# Patient Record
Sex: Female | Born: 1935 | Race: White | Hispanic: No | Smoking: Never smoker
Health system: Southern US, Community
[De-identification: ages and names within clinical notes are randomized; demographics above are authoritative.]

## PROBLEM LIST (undated history)

## (undated) DIAGNOSIS — E039 Hypothyroidism, unspecified: Secondary | ICD-10-CM

## (undated) DIAGNOSIS — R42 Dizziness and giddiness: Secondary | ICD-10-CM

## (undated) DIAGNOSIS — F329 Major depressive disorder, single episode, unspecified: Secondary | ICD-10-CM

## (undated) DIAGNOSIS — R0602 Shortness of breath: Secondary | ICD-10-CM

## (undated) DIAGNOSIS — K559 Vascular disorder of intestine, unspecified: Secondary | ICD-10-CM

## (undated) DIAGNOSIS — F32A Depression, unspecified: Secondary | ICD-10-CM

## (undated) DIAGNOSIS — I639 Cerebral infarction, unspecified: Secondary | ICD-10-CM

## (undated) DIAGNOSIS — R918 Other nonspecific abnormal finding of lung field: Secondary | ICD-10-CM

## (undated) DIAGNOSIS — I2510 Atherosclerotic heart disease of native coronary artery without angina pectoris: Secondary | ICD-10-CM

## (undated) DIAGNOSIS — M1990 Unspecified osteoarthritis, unspecified site: Secondary | ICD-10-CM

## (undated) DIAGNOSIS — N390 Urinary tract infection, site not specified: Secondary | ICD-10-CM

## (undated) DIAGNOSIS — A0811 Acute gastroenteropathy due to Norwalk agent: Secondary | ICD-10-CM

## (undated) DIAGNOSIS — I5032 Chronic diastolic (congestive) heart failure: Secondary | ICD-10-CM

## (undated) DIAGNOSIS — R0989 Other specified symptoms and signs involving the circulatory and respiratory systems: Secondary | ICD-10-CM

## (undated) DIAGNOSIS — G4733 Obstructive sleep apnea (adult) (pediatric): Secondary | ICD-10-CM

## (undated) DIAGNOSIS — N200 Calculus of kidney: Secondary | ICD-10-CM

## (undated) DIAGNOSIS — K219 Gastro-esophageal reflux disease without esophagitis: Secondary | ICD-10-CM

## (undated) DIAGNOSIS — E7800 Pure hypercholesterolemia, unspecified: Secondary | ICD-10-CM

## (undated) DIAGNOSIS — Z5189 Encounter for other specified aftercare: Secondary | ICD-10-CM

## (undated) DIAGNOSIS — T148XXA Other injury of unspecified body region, initial encounter: Secondary | ICD-10-CM

## (undated) HISTORY — PX: HERNIA REPAIR: SHX51

## (undated) HISTORY — PX: TONSILLECTOMY: SUR1361

## (undated) HISTORY — DX: Vascular disorder of intestine, unspecified: K55.9

## (undated) HISTORY — PX: APPENDECTOMY: SHX54

## (undated) HISTORY — PX: OTHER SURGICAL HISTORY: SHX169

## (undated) HISTORY — DX: Obstructive sleep apnea (adult) (pediatric): G47.33

## (undated) HISTORY — PX: ABDOMINAL HYSTERECTOMY: SHX81

## (undated) HISTORY — PX: HIP SURGERY: SHX245

## (undated) HISTORY — PX: CATARACT EXTRACTION: SUR2

---

## 1998-04-23 ENCOUNTER — Inpatient Hospital Stay (HOSPITAL_COMMUNITY): Admission: RE | Admit: 1998-04-23 | Discharge: 1998-04-26 | Payer: Self-pay | Admitting: Urology

## 2004-01-03 ENCOUNTER — Encounter: Payer: Self-pay | Admitting: Pulmonary Disease

## 2004-07-02 ENCOUNTER — Ambulatory Visit: Payer: Self-pay | Admitting: Internal Medicine

## 2004-07-17 ENCOUNTER — Ambulatory Visit: Payer: Self-pay | Admitting: Internal Medicine

## 2004-07-31 ENCOUNTER — Ambulatory Visit: Payer: Self-pay | Admitting: Internal Medicine

## 2004-08-03 ENCOUNTER — Emergency Department (HOSPITAL_COMMUNITY): Admission: EM | Admit: 2004-08-03 | Discharge: 2004-08-04 | Payer: Self-pay | Admitting: Emergency Medicine

## 2004-08-05 ENCOUNTER — Ambulatory Visit (HOSPITAL_COMMUNITY): Admission: RE | Admit: 2004-08-05 | Discharge: 2004-08-05 | Payer: Self-pay | Admitting: Pulmonary Disease

## 2004-08-05 ENCOUNTER — Ambulatory Visit: Payer: Self-pay | Admitting: Pulmonary Disease

## 2004-08-06 ENCOUNTER — Ambulatory Visit (HOSPITAL_COMMUNITY): Admission: RE | Admit: 2004-08-06 | Discharge: 2004-08-06 | Payer: Self-pay | Admitting: Pulmonary Disease

## 2004-08-06 ENCOUNTER — Encounter: Payer: Self-pay | Admitting: Cardiology

## 2004-08-07 ENCOUNTER — Ambulatory Visit: Payer: Self-pay | Admitting: Cardiology

## 2004-08-19 ENCOUNTER — Ambulatory Visit (HOSPITAL_COMMUNITY): Admission: RE | Admit: 2004-08-19 | Discharge: 2004-08-19 | Payer: Self-pay | Admitting: Pulmonary Disease

## 2004-08-27 ENCOUNTER — Ambulatory Visit: Payer: Self-pay | Admitting: Pulmonary Disease

## 2004-09-12 ENCOUNTER — Ambulatory Visit: Payer: Self-pay | Admitting: Pulmonary Disease

## 2004-09-18 ENCOUNTER — Encounter: Admission: RE | Admit: 2004-09-18 | Discharge: 2004-09-18 | Payer: Self-pay | Admitting: Neurology

## 2004-12-05 ENCOUNTER — Encounter: Payer: Self-pay | Admitting: Pulmonary Disease

## 2004-12-11 ENCOUNTER — Ambulatory Visit: Payer: Self-pay | Admitting: Pulmonary Disease

## 2004-12-23 ENCOUNTER — Ambulatory Visit: Payer: Self-pay | Admitting: Internal Medicine

## 2005-01-05 ENCOUNTER — Ambulatory Visit (HOSPITAL_BASED_OUTPATIENT_CLINIC_OR_DEPARTMENT_OTHER): Admission: RE | Admit: 2005-01-05 | Discharge: 2005-01-05 | Payer: Self-pay | Admitting: Pulmonary Disease

## 2005-01-07 ENCOUNTER — Ambulatory Visit: Payer: Self-pay | Admitting: Pulmonary Disease

## 2005-01-21 ENCOUNTER — Ambulatory Visit: Payer: Self-pay | Admitting: Pulmonary Disease

## 2005-05-25 DIAGNOSIS — Z5189 Encounter for other specified aftercare: Secondary | ICD-10-CM

## 2005-05-25 DIAGNOSIS — IMO0001 Reserved for inherently not codable concepts without codable children: Secondary | ICD-10-CM

## 2005-05-25 HISTORY — DX: Reserved for inherently not codable concepts without codable children: IMO0001

## 2005-05-25 HISTORY — PX: JOINT REPLACEMENT: SHX530

## 2005-05-25 HISTORY — DX: Encounter for other specified aftercare: Z51.89

## 2005-07-02 ENCOUNTER — Ambulatory Visit: Payer: Self-pay | Admitting: *Deleted

## 2005-07-09 ENCOUNTER — Ambulatory Visit: Payer: Self-pay | Admitting: Pulmonary Disease

## 2005-12-30 ENCOUNTER — Ambulatory Visit: Payer: Self-pay | Admitting: Pulmonary Disease

## 2005-12-30 ENCOUNTER — Ambulatory Visit: Payer: Self-pay | Admitting: Cardiology

## 2006-07-22 ENCOUNTER — Ambulatory Visit: Payer: Self-pay | Admitting: Pulmonary Disease

## 2007-05-26 HISTORY — PX: CORONARY ARTERY BYPASS GRAFT: SHX141

## 2007-06-23 ENCOUNTER — Ambulatory Visit: Payer: Self-pay | Admitting: Cardiothoracic Surgery

## 2007-06-30 ENCOUNTER — Ambulatory Visit (HOSPITAL_COMMUNITY): Admission: RE | Admit: 2007-06-30 | Discharge: 2007-06-30 | Payer: Self-pay | Admitting: Cardiothoracic Surgery

## 2007-06-30 ENCOUNTER — Encounter: Payer: Self-pay | Admitting: Cardiothoracic Surgery

## 2007-07-04 ENCOUNTER — Inpatient Hospital Stay (HOSPITAL_COMMUNITY): Admission: RE | Admit: 2007-07-04 | Discharge: 2007-07-09 | Payer: Self-pay | Admitting: Cardiothoracic Surgery

## 2007-07-04 ENCOUNTER — Ambulatory Visit: Payer: Self-pay | Admitting: Cardiothoracic Surgery

## 2007-07-21 ENCOUNTER — Encounter: Payer: Self-pay | Admitting: Pulmonary Disease

## 2007-07-29 ENCOUNTER — Ambulatory Visit: Payer: Self-pay | Admitting: Infectious Diseases

## 2007-07-29 ENCOUNTER — Inpatient Hospital Stay (HOSPITAL_COMMUNITY): Admission: EM | Admit: 2007-07-29 | Discharge: 2007-08-05 | Payer: Self-pay | Admitting: Emergency Medicine

## 2007-09-15 ENCOUNTER — Ambulatory Visit: Payer: Self-pay | Admitting: Cardiothoracic Surgery

## 2008-05-25 DIAGNOSIS — G4733 Obstructive sleep apnea (adult) (pediatric): Secondary | ICD-10-CM

## 2008-05-25 DIAGNOSIS — K559 Vascular disorder of intestine, unspecified: Secondary | ICD-10-CM

## 2008-05-25 HISTORY — DX: Obstructive sleep apnea (adult) (pediatric): G47.33

## 2008-05-25 HISTORY — DX: Vascular disorder of intestine, unspecified: K55.9

## 2008-11-22 DIAGNOSIS — F411 Generalized anxiety disorder: Secondary | ICD-10-CM | POA: Insufficient documentation

## 2008-11-22 DIAGNOSIS — I6350 Cerebral infarction due to unspecified occlusion or stenosis of unspecified cerebral artery: Secondary | ICD-10-CM

## 2008-11-22 DIAGNOSIS — E039 Hypothyroidism, unspecified: Secondary | ICD-10-CM

## 2008-11-23 ENCOUNTER — Ambulatory Visit: Payer: Self-pay | Admitting: Pulmonary Disease

## 2008-11-23 ENCOUNTER — Ambulatory Visit: Payer: Self-pay | Admitting: Gynecology

## 2008-11-23 DIAGNOSIS — J984 Other disorders of lung: Secondary | ICD-10-CM

## 2008-11-23 DIAGNOSIS — G4733 Obstructive sleep apnea (adult) (pediatric): Secondary | ICD-10-CM

## 2008-11-23 DIAGNOSIS — R0902 Hypoxemia: Secondary | ICD-10-CM | POA: Insufficient documentation

## 2008-12-10 ENCOUNTER — Inpatient Hospital Stay (HOSPITAL_COMMUNITY): Admission: EM | Admit: 2008-12-10 | Discharge: 2008-12-13 | Payer: Self-pay | Admitting: Emergency Medicine

## 2008-12-10 ENCOUNTER — Ambulatory Visit: Payer: Self-pay | Admitting: Gastroenterology

## 2008-12-18 ENCOUNTER — Ambulatory Visit: Payer: Self-pay | Admitting: Gynecology

## 2008-12-27 ENCOUNTER — Encounter: Admission: RE | Admit: 2008-12-27 | Discharge: 2008-12-27 | Payer: Self-pay | Admitting: Cardiovascular Disease

## 2009-01-25 ENCOUNTER — Telehealth (INDEPENDENT_AMBULATORY_CARE_PROVIDER_SITE_OTHER): Payer: Self-pay | Admitting: *Deleted

## 2010-05-07 ENCOUNTER — Encounter
Admission: RE | Admit: 2010-05-07 | Discharge: 2010-05-07 | Payer: Self-pay | Source: Home / Self Care | Attending: Internal Medicine | Admitting: Internal Medicine

## 2010-06-15 ENCOUNTER — Encounter: Payer: Self-pay | Admitting: Cardiothoracic Surgery

## 2010-06-15 ENCOUNTER — Encounter: Payer: Self-pay | Admitting: Pulmonary Disease

## 2010-08-31 LAB — DIFFERENTIAL
Eosinophils Absolute: 0.2 10*3/uL (ref 0.0–0.7)
Lymphs Abs: 2.1 10*3/uL (ref 0.7–4.0)
Neutro Abs: 9.7 10*3/uL — ABNORMAL HIGH (ref 1.7–7.7)
Neutrophils Relative %: 75 % (ref 43–77)

## 2010-08-31 LAB — URINE CULTURE

## 2010-08-31 LAB — BASIC METABOLIC PANEL
BUN: 4 mg/dL — ABNORMAL LOW (ref 6–23)
BUN: 6 mg/dL (ref 6–23)
CO2: 24 mEq/L (ref 19–32)
CO2: 29 mEq/L (ref 19–32)
CO2: 30 mEq/L (ref 19–32)
Calcium: 7.6 mg/dL — ABNORMAL LOW (ref 8.4–10.5)
Calcium: 8.4 mg/dL (ref 8.4–10.5)
Chloride: 104 mEq/L (ref 96–112)
Chloride: 111 mEq/L (ref 96–112)
Creatinine, Ser: 0.5 mg/dL (ref 0.4–1.2)
Creatinine, Ser: 0.51 mg/dL (ref 0.4–1.2)
Creatinine, Ser: 0.54 mg/dL (ref 0.4–1.2)
GFR calc Af Amer: >60 mL/min (ref >60)
GFR calc Af Amer: >60 mL/min (ref >60)
Glucose, Bld: 104 mg/dL — ABNORMAL HIGH (ref 70–99)
Potassium: 3.9 mEq/L (ref 3.5–5.1)
Sodium: 143 mEq/L (ref 135–145)

## 2010-08-31 LAB — CBC
HCT: 27.8 % — ABNORMAL LOW (ref 36.0–46.0)
HCT: 30.1 % — ABNORMAL LOW (ref 36.0–46.0)
Hemoglobin: 10.3 g/dL — ABNORMAL LOW (ref 12.0–15.0)
Hemoglobin: 11.4 g/dL — ABNORMAL LOW (ref 12.0–15.0)
MCHC: 33.7 g/dL (ref 30.0–36.0)
MCHC: 34.3 g/dL (ref 30.0–36.0)
MCHC: 34.4 g/dL (ref 30.0–36.0)
MCHC: 34.5 g/dL (ref 30.0–36.0)
MCV: 94.8 fL (ref 78.0–100.0)
MCV: 94.9 fL (ref 78.0–100.0)
MCV: 95.5 fL (ref 78.0–100.0)
RBC: 2.91 MIL/uL — ABNORMAL LOW (ref 3.87–5.11)
RBC: 3.17 MIL/uL — ABNORMAL LOW (ref 3.87–5.11)
RBC: 3.47 MIL/uL — ABNORMAL LOW (ref 3.87–5.11)
RDW: 12.9 % (ref 11.5–15.5)
RDW: 13.5 % (ref 11.5–15.5)
WBC: 6.5 10*3/uL (ref 4.0–10.5)

## 2010-08-31 LAB — URINE MICROSCOPIC-ADD ON

## 2010-08-31 LAB — COMPREHENSIVE METABOLIC PANEL
ALT: 14 U/L (ref 0–35)
BUN: 11 mg/dL (ref 6–23)
Calcium: 8.3 mg/dL — ABNORMAL LOW (ref 8.4–10.5)
GFR calc non Af Amer: >60 mL/min (ref >60)
Total Protein: 6.1 g/dL (ref 6.0–8.3)

## 2010-08-31 LAB — CK TOTAL AND CKMB (NOT AT ARMC)
CK, MB: 2.4 ng/mL (ref 0.3–4.0)
Total CK: 59 U/L (ref 7–177)

## 2010-08-31 LAB — URINALYSIS, ROUTINE W REFLEX MICROSCOPIC
Glucose, UA: NEGATIVE mg/dL
Hgb urine dipstick: NEGATIVE
Specific Gravity, Urine: 1.022 (ref 1.005–1.030)

## 2010-08-31 LAB — CARDIAC PANEL(CRET KIN+CKTOT+MB+TROPI)
Relative Index: INVALID (ref 0.0–2.5)
Troponin I: 0.02 ng/mL (ref 0.00–0.06)

## 2010-08-31 LAB — TSH: TSH: 1.982 u[IU]/mL (ref 0.350–4.500)

## 2010-08-31 LAB — LIPASE, BLOOD: Lipase: 15 U/L (ref 11–59)

## 2010-08-31 LAB — SAMPLE TO BLOOD BANK

## 2010-10-07 NOTE — H&P (Signed)
NAME:  Gina Bowers, Gina Bowers               ACCOUNT NO.:  [id number]   MEDICAL RECORD NO.:  [id number]          PATIENT TYPE:  INP   LOCATION:  5528                         FACILITY:  [facility]   PHYSICIAN:  Beckey Rutter, MD  DATE OF BIRTH:  Oct 02, 1925   DATE OF ADMISSION:  07/28/2007  DATE OF DISCHARGE:                              HISTORY & PHYSICAL   PRIMARY CARDIOLOGIST:  Dr. Alanda Amass   CHIEF COMPLAINT:  Confusion.   HISTORY OF PRESENT ILLNESS:  This is an 78 year old pleasant Caucasian  female with past medical history significant for coronary artery  disease.  She is status post CABG on July 04, 2007.  She also has  past medical history significant for depression, gastroesophageal reflux  disease, high cholesterol, hypothyroidism, history of CVA, and urinary  tract infection and chronic suppression with Macrobid.  The patient  brought in today by ambulance because of altered mentation and inability  to answer the questions asked by the daughter correctly.  The patient  has some visual hallucination, seeing furniture where they are not  there.  The patient felt detached and could not recognize the place they  are living in as per the daughter.  There is no specific focal weakness  noted.   PAST MEDICAL HISTORY:  Significant for:  1. Coronary artery disease status post CABG 3 weeks ago on July 04, 2007, done by Dr. Tyrone Sage.  2. Depression.  3. GERD.  4. High cholesterol.  5. Hypothyroidism.  6. History of CVA.  7. Urinary tract infection.   SOCIAL HISTORY:  No drug abuse, ethanol abuse or tobacco abuse.   FAMILY HISTORY:  Noncontributory.   MEDICATION ALLERGY:  SULFA/SULFONAMIDES.   MEDICATION:  1. Detrol 4 mg daily.  2. Vytorin 10/40 one tablet daily.  3. Plavix 75 mg p.o. daily.  4. Nexium 40 mg daily.  5. Zoloft 75 mg p.o.  6. Reglan 10 mg p.o. daily.  7. Synthroid 75 mcg daily.  8. Macrodantin 100 mg daily.  9. Aspirin 325 mg daily.  10.Folic acid 1  mg daily.  11.Tramadol/acetaminophen 50.  12.Lasix.  13.Potassium chloride.   REVIEW OF SYSTEMS:  A 12-point review of systems is noncontributory.   EXAMINATION:  Temperature 101.9, blood pressure 93/53, pulse 84,  respiratory rate is 20.  HEAD:  Atraumatic, normocephalic.  Eyes:  PERRL.  Mouth moist, no ulcer.  NECK:  Supple.  No JVD.  PRECORDIUM:  First and second heart sounds audible.  No added sounds.  ABDOMEN:  Soft, nontender.  Bowel sounds present.  EXTREMITIES:  No lower extremities edema.  NEUROLOGICALLY:  The patient is alert, oriented x3.   LABORATORIES:  Showing urinalysis:  Color is a amber, cloudy; pH is 5.5;  ketones negative; nitrate is negative; leukocyte esterase negative.  Creatinine is 0.6, sodium is 136, potassium 4.1, chloride 102, glucose  171, BUN is 13.  Hemoglobin A1c is 5.6.  White blood count is 13.1,  hemoglobin is 11.6, hematocrit is 35.3 and platelet count is 324.  Chest  x-ray done on July 28, 2007, impression is small pleural  effusions and  bibasilar atelectasis.  Improved basilar aeration versus previous study.  CT head without contrast done on July 28, 2007, impression:  Atrophy  with small-vessel chronic ischemic change of deep cerebral white matter,  no acute intracranial abnormalities.   ASSESSMENT AND PLAN:  This lady is a Caucasian female, presented with  neurological symptoms which are resolved now.  It could be very well a  transient ischemic attack picture and the plan is to admit the patient  for further workup.  The patient has a slight white count though there  is no source of infection which could be the culprit behind the change  in mental station, i.e. the infectious status.  But transient ischemic  attack cannot be ruled entirely out as of this moment.   1. The patient will be admitted to telemetry floor for further      assessment and management.  2. The patient has a primary neurologist, Dr. Pearlean Brownie.  We will consult      for  further workup and medical opinion.  3. Will continue the patient on lipid-lowering medication as well as      the Plavix.  4. The patient also will be continued on her Synthroid.  Will hold the      Lasix and potassium chloride for some time before reinstating.  5. For DVT prophylaxis will consider Lovenox.  6. For GI prophylaxis we will start the patient on Protonix.      Beckey Rutter, MD  Electronically Signed     EME/MEDQ  D:  07/29/2007  T:  07/30/2007  Job:  [id number]

## 2010-10-07 NOTE — Assessment & Plan Note (Signed)
OFFICE VISIT   Gina Bowers, Gina Bowers  DOB:  12-Jun-1925                                        September 15, 2007  CHART #:  [id number]   HISTORY OF PRESENT ILLNESS:  The patient is an 77 year old female who is  status post coronary artery bypass grafting x4 on 07/04/2007 for severe  coronary artery disease.  Since her discharge, she did require  rehospitalization by the medical physicians for a urinary tract  infection with mental status changes.  Dr. Tyrone Sage did see the patient  during this hospitalization.  Since her discharge, she has also followed  up with her cardiologist, Dr. Alanda Amass, on 2 occasions.  On today's  date, she reports her overall progress is slow but good.  She is  scheduled to start cardiac rehabilitation next week.  She is having no  significant issues related to her incisions or other surgical issues.  She denies significant shortness of breath.  She does note that her  sternum continues to be somewhat sore but improving with time.   PHYSICAL EXAMINATION:  Vital signs:  Blood pressure 118/77, pulse 90,  respirations 22, oxygen saturation 92% on room air.  General:  An  elderly female in no acute distress.  Pulmonary examination reveals  slightly diminished breath sounds in the bases, otherwise clear.  Cardiac examination:  Regular rate and rhythm.  No murmurs, gallops, or  rubs.  Extremity examination reveals minor edema.  Her incisions are all  well healed without evidence of infection.   ASSESSMENT:  The patient is making adequate progress.  She is encouraged  to continue her cardiac rehabilitation, and continue her followup with  cardiology for risk modification management.  She has no significant  current surgical issues.  She will be seen from our viewpoint, now, on a  p.r.n. basis.   Rowe Clack, P.A.-C.   Sherryll Burger  D:  09/15/2007  T:  09/15/2007  Job:  [id number]   cc:   Gerlene Burdock A. Alanda Amass, M.D.

## 2010-10-07 NOTE — Discharge Summary (Signed)
NAME:  Gina Bowers, Gina Bowers               ACCOUNT NO.:  [id number]   MEDICAL RECORD NO.:  [id number]          PATIENT TYPE:  INP   LOCATION:  5528                         FACILITY:  [facility]   PHYSICIAN:  Ladell Pier, M.D.   DATE OF BIRTH:  14-Nov-1925   DATE OF ADMISSION:  07/28/2007  DATE OF DISCHARGE:  08/02/2007                               DISCHARGE SUMMARY   DISCHARGE DIAGNOSES:  1. Fever.  2. Altered mental status.  3. Anemia.  4. Leukocytosis.  5. Hypothyroidism.  6. Obstructive sleep apnea, followed by pulmonary.  7. Coronary artery disease, status post coronary artery bypass graft      surgery on June 27, 2007.  8. Depression.  9. History of transient ischemic attack, followed by Dr. Sunny Schlein.      Sethi.  10.History of cerebrovascular accident.  11.History of dyslipidemia.   DISCHARGE MEDICATIONS:  1. Detrol 4 mg daily.  2. Vytorin 10/40 mg daily.  3. Plavix 75 mg daily.  4. Nexium 40 mg daily.  5. Zoloft 75 mg daily.  6. Reglan 10 mg daily.  7. Synthroid 75 mcg daily.  8. Macrodantin 100 mg daily.  9. Aspirin 325 mg daily.  10.Folic acid 1 mg daily.  11.Tramadol p.r.n.   FOLLOWUP:  The patient is to follow up with her primary care physician  in one to two weeks.   PROCEDURE:  None.   CONSULTATIONS:  1. Infectious Disease, Dr. Tinnie Gens C. Hatcher.  2. Dr. Sheliah Plane also came to see the patient.  3. A curbside consultation with Dr. Melvyn Novas, neurology.   HISTORY OF PRESENT ILLNESS:  The patient is an 77 year old Caucasian  female with a past medical history significant for heart disease.  Recently had open heart surgery.  She has also had a past medical  history significant for depression, gastroesophageal reflux disease,  high cholesterol, hypothyroidism, stroke and frequent urinary tract  infections on chronic suppression therapy.  The patient was brought in  by ambulance, secondary to an altered mental status and the inability to  answer  questions asked by the daughter correctly.  The patient has some  visual hallucinations.  She was seeing furniture where there was none.  She felt detached and could not recognize the place they are living in,  as per her daughter.  No focal  neurological deficits.   PAST MEDICAL/SOCIAL/FAMILY HISTORY/MEDICATIONS/ALLERGIES/REVIEW OF  SYSTEMS:  As per the admission H&P.   PHYSICAL EXAMINATION:  VITAL SIGNS:  Temperature 97.4 degrees, pulse 67,  respirations 18, blood pressure 130/72, pulse oximetry 96% on 2 liters.  HEENT:  Normocephalic and atraumatic.  Pupils reactive to light.  Throat  without erythema.  CARDIOVASCULAR:  A regular rate and rhythm with a midline scar from her  prior surgery.  LUNGS:  Clear bilaterally.  No wheezes, rhonchi or rales.  ABDOMEN:  Positive bowel sounds.  EXTREMITIES:  No edema.  NEUROLOGIC:  Nonfocal.   HOSPITAL COURSE:  #1 - Altered mental status:  The patient was admitted  to the hospital.  She had a CT scan that was negative, followed by an  MRI  scan that was negative.  Notified neurology since the patient had  negative scans and no neurologic deficits.  She did not need any further  neurologic workup.  Her altered mental status changes resolved while she  was an inpatient.  She will be continued on aspirin and Plavix.  She  also had an RPR, TSH and B12 levels checked, which were normal.   #2 - Fever:  On admission the patient was with elevated temperatures up  to over 101 degrees.  She had blood cultures done that were negative.  She had a urine done that was normal.  A chest x-ray did not show  infiltrates.  She had a CT of the chest that showed a small effusion but  no evidence of mediastinal abscess.  She was treated with vancomycin and  Rocephin.  The vancomycin and Rocephin were eventually discontinued.  The patient will be discharged home.  If the fever returns, discussed  with her that she should follow up.  She also had a scan of her thigh  to  rule out hematoma, and that was also negative for hematoma or abscess.  This was an ultrasound of her right lower extremity.   #3 - Depression:  She will continue on her home medications.   #4 - Gastroesophageal reflux disease:  Continued on PPI.   #5 - Hypothyroidism:  She had a TSH checked that was normal.  She was  continued on her thyroid replacement therapy.   DISCHARGE LABORATORY DATA:  Sodium 141, potassium 4.2, chloride 105, CO2  of 31, glucose 88, BUN 8, creatinine 0.50.  WBC 8.5, hemoglobin 10.1,  platelets 247, folate 1321.  Blood cultures negative x2.  RPR  nonreactive.      Ladell Pier, M.D.  Electronically Signed     NJ/MEDQ  D:  08/02/2007  T:  08/02/2007  Job:  [id number]

## 2010-10-07 NOTE — Op Note (Signed)
NAME:  Gina Bowers, Gina Bowers               ACCOUNT NO.:  [id number]   MEDICAL RECORD NO.:  [id number]          PATIENT TYPE:  INP   LOCATION:  2313                         FACILITY:  [facility]   PHYSICIAN:  Sheliah Plane, MD    DATE OF BIRTH:  1926-01-17   DATE OF PROCEDURE:  07/04/2007  DATE OF DISCHARGE:                               OPERATIVE REPORT   PREOPERATIVE DIAGNOSIS:  Coronary occlusive disease with question of  patent foramen.   POSTOPERATIVE DIAGNOSIS:  Coronary occlusive disease with no evidence of  patent foramen seen on transesophageal echo.   PROCEDURE PERFORMED:  Coronary artery bypass grafting x4 with left  internal mammary to the left anterior descending coronary artery,  reversed saphenous vein graft to the diagonal coronary artery, reversed  saphenous vein graft to the circumflex coronary artery, reversed  saphenous vein graft to the posterior descending coronary artery with  endovein harvesting.   SURGEON:  Sheliah Plane, M.D.   FIRST ASSISTANT:  Coral Ceo, P.A.-C.   BRIEF HISTORY:  The patient is a 77 year old female who presented with  increasing symptoms of substernal chest pain and shortness of breath  with exertion.  She underwent cardiac catheterization by Dr. Alanda Amass  which demonstrated severe three vessel coronary artery disease including  a complex of proximal LAD lesion, disease in the small diagonal branch,  70-80% stenosis of the circumflex, and multiple 70% and 80% lesions of  the right coronary artery with preserved LV function. The patient had a  question of a patent foramen on previous echo, however, this could not  be confirmed with preoperative transthoracic echo, with bubble study,  nor with the TEE at the time of surgery. Coronary artery bypass grafting  was recommended to the patient who agreed and signed informed consent.   DESCRIPTION OF PROCEDURE:  With Swan-Ganz and arterial line monitors in  place, the patient underwent general  endotracheal anesthesia without  incident.  The skin of the chest and legs was prepped with Betadine and  draped in the usual sterile manner.  Initially, a small incision was  made at the right knee.  However, there was no suitable vein found at  this incision. So, in a similar fashion, an incision was made in the  left leg and using the [employer] endovein harvesting system, vein was  harvested from the thigh and calf.  A median sternotomy was performed,  the left internal mammary artery was dissected down as a pedicle graft.  The distal artery was divided, it had good free flow.  The pericardium  was opened and overall ventricular function appeared preserved.  The  patient was systemically heparinized, ascending aorta was cannulated,  the right atrium was cannulated.  An aortic root vent cardioplegia  needle was introduced into the ascending aorta.  The patient was placed  on cardiopulmonary bypass at 2.4 liters per minute per meter square.  Sites of anastomosis were selected and dissected out of the epicardium.  The patient's body temperature was cooled to 30 degrees.  The aortic  crossclamp was applied and 500 mL of cold blood potassium cardioplegia  was administered  with diastolic arrest of the heart.  The myocardial  septal temperature was monitored throughout the crossclamp period.   Attention was turned first to the circumflex coronary artery which was  partially intramyocardial.  The vessel was opened admitted a 1.5 mm  probe. Using running 7-0 Prolene, the distal anastomosis was performed  with a segment of reversed saphenous vein graft.  The second diagonal  coronary artery was identified and was a 1 mm vessel. Using a running 8-  0 Prolene, the distal anastomosis was performed.  Attention was then  turned to the right system.  The posterior descending coronary artery  was opened and admitted a 1.5 mm probe proximally and distally. Using  running 7-0 Prolene, a distal anastomosis  was performed with a segment  of reversed saphenous vein graft.  Attention was then turned to the left  anterior descending coronary artery. Between the mid and distal third of  the vessel, the LAD was opened and a 1.5 mm probe was passed distally.  Using running 8-0 Prolene, the left internal mammary artery was  anastomosed to the left anterior descending coronary artery. With the  crossclamp still in place, three punch aortotomies were performed.  Each  of the three vein grafts were anastomosed to the ascending aorta.  Air  was evacuated from the grafts and ascending aorta and the aortic  crossclamp was removed.  Total crossclamp time was 97 minutes.  The  patient spontaneously converted to a sinus rhythm.  The sites of  anastomosis were inspected and were free of bleeding.  The patient was  then ventilated and weaned from cardiopulmonary bypass without  difficulty.  Total pump time was 135 minutes.  The sites of anastomosis  were inspected and were noted to be free of bleeding.  The patient was  decannulated in the usual fashion. Protamine sulfate was administered.  With the operative field hemostatic, a left pleural tube and a Blake  mediastinal drain were left in place.  The sternum was closed with #6  stainless steel wire, the fascia closed with interrupted 0 Vicryl,  running 3-0 Vicryl in the subcutaneous tissue, and 4-0 subcuticular  stitch in the skin edges.  Dry dressings were applied.  Sponge and  needle counts were reported as correct at the completion of the  procedure.  The patient tolerated the procedure without obvious  complication and was transferred to the surgical intensive care unit for  further postoperative care.      Sheliah Plane, MD  Electronically Signed     EG/MEDQ  D:  07/05/2007  T:  07/05/2007  Job:  [id number]   cc:   Gerlene Burdock A. Alanda Amass, M.D.

## 2010-10-07 NOTE — H&P (Signed)
NAME:  Gina Bowers, Gina Bowers               ACCOUNT NO.:  [id number]   MEDICAL RECORD NO.:  [id number]          PATIENT TYPE:  EMS   LOCATION:  MAJO                         FACILITY:  [facility]   PHYSICIAN:  Hollice Espy, M.D.DATE OF BIRTH:  03-29-1926   DATE OF ADMISSION:  12/10/2008  DATE OF DISCHARGE:                              HISTORY & PHYSICAL   PRIMARY CARE PHYSICIAN:  Thora Lance, M.D.   CARDIOLOGIST:  Richard A. Alanda Amass, M.D.   CHIEF COMPLAINT:  Nausea, vomiting, and bright red blood per rectum.   HISTORY OF PRESENT ILLNESS:  Gina Bowers is an 77 year old white female  with multiple medical problems who reports to [facility] ED with 2-day  history of nausea, vomiting, and onset of bright red blood per rectum x1  on morning of admit.  The patient reports several episodes of vomiting  yesterday with abdominal cramping and intermittent left lower quadrant  pain.  Of note, the patient was recently treated for finger infection  with p.o. Cipro, which was completed approximately 2 weeks ago.  The  patient also treated herself last week for sinus congestion with over-  the-counter Sudafed and Delsym.  Granddaughter reports low-grade fever  of 100.0 yesterday as well as nonproductive cough and generalized  weakness.  Granddaughter also reports dyspnea on exertion for several  weeks with orthostatic dizziness yesterday and today.  Also of note, the  patient was started on ACE inhibitor by Cardiology on December 04, 2008.   PAST MEDICAL HISTORY:  1. CAD, status post CABG on February 2009 per Dr. Tyrone Sage.  2. Hypertension.  3. Hypothyroidism.  4. Obstructive sleep apnea.  5. Anemia, chronic.  6. Chronic UTIs.  7. GERD.  8. Dyslipidemia.  9. History of CVA.   PAST SURGICAL HISTORY:  1. Bilateral inguinal hernia repair, remote.  2. Left total hip replacement done in [place] in 2007.   ALLERGIES:  SULFA.   MEDICATIONS:  1. Lisinopril 10 mg p.o. daily.  2. Lansoprazole  30 mg p.o. daily.  3. Synthroid 75 mcg p.o. daily.  4. Plavix 75 mg p.o. daily.  5. Zoloft 75 mg p.o. daily.  6. Detrol 4 mg p.o. daily.  7. Toprol-XL 12.5 mg p.o. daily.  8. Vytorin 10/40 p.o. daily.  9. Aspirin 81 mg p.o. daily.  10.Colace 100 mg p.o. daily.  11.Lamisil 250 mg p.o. daily.   FAMILY HISTORY:  Reviewed and noncontributory to admit.   SOCIAL HISTORY:  The patient is widowed.  She lives with her family.  No  history of tobacco or EtOH use.   REVIEW OF SYSTEMS:  GENERAL:  The patient reports low-grade temperature  and generalized weakness.  EYES:  No blurry vision or diplopia.  EARS, NOSE, AND THROAT:  The patient with dry mouth.  No sore throat or  oropharyngeal lesions.  NECK:  Without any stiffness or swelling.  CARDIOVASCULAR:  The patient does report dyspnea on exertion.  Denies  any chest pain or palpitations.  No lower extremity swelling.  RESPIRATORY:  The patient reports nonproductive cough, now resolved.  GI:  The patient reports nausea,  vomiting, diarrhea, hematochezia.  The  patient also reports left lower quadrant tenderness.  GU:  The patient with chronic dysuria.  Denies hematuria.  NEUROLOGIC:  The patient denies any headache or dizziness.  SKIN:  The patient denies any suspicious rashes or lesions.   PHYSICAL EXAM:  VITAL SIGNS:  Blood pressure on arrival to ED 88/57 now  112/43, heart rate 62, respirations 18, temperature 98.0, O2 saturation  is 93% on room air.  GENERAL:  This is an elderly white female, awake and alert, in no acute  distress.  HEAD:  Normocephalic, atraumatic.  EYES:  Pupils are equal, round, and reactive to light.  Extraocular  movements are intact.  No scleral icterus or injection.  EARS, NOSE, AND THROAT:  Mucous membranes are very dry.  No  oropharyngeal lesions.  NECK:  Supple without any thyromegaly or lymphadenopathy.  CHEST:  Symmetrical movement, nontender to palpation.  CARDIOVASCULAR:  S1, S2.  Regular rate and  rhythm.  No JVD or carotid  bruits.  EXTREMITIES:  No lower extremity swelling.  RESPIRATORY:  The patient with bibasilar rales.  No wheezing.  No  increased work of breathing.  GI:  The abdomen is soft.  Nondistended.  Tender upon palpation.  Left  lower quadrant without any rebound or guarding.  No appreciated masses  or hepatosplenomegaly.  NEUROLOGIC:  The patient is able to move all extremities x4 without any  motor or sensory deficits on exam.  PSYCHOLOGIC:  The patient is alert and oriented x3 with very pleasant  mood and affect.   LABS AND X-RAYS:  The patient's white cell count 13.1 with absolute  neutrophil count of 9.7.  Hemoglobin 11.4, hematocrit 33.0, platelet  count 197.  Sodium 138, potassium 4.0, BUN 11, creatinine 0.67.  Liver  function tests were within normal limits.  Calcium 8.3, lipase normal.  Cardiac point of care markers are negative x1.   Chest x-ray with no acute findings.   EKG:  Normal sinus rhythm at 62 beats per minute with no ischemic  changes.   IMPRESSION AND PLAN:  1. Nausea, vomiting, and diarrhea with hematochezia.  Uncertain      etiology, question of viral versus infectious versus ischemic.  We      will admit the patient to telemetry.  We will check CT of the      abdomen and pelvis to rule out colitis, whether ischemic versus      infectious versus diverticulitis versus other.  We will check stool      cultures with recent antibiotic use.  Given the patient's left      lower quadrant tenderness and increase in white blood cell count      with left shift, we will start empiric Cipro and Flagyl.  If      symptoms persists, we will consider GI evaluation.  We will hold      aspirin.  2. Leukocytosis, mild.  Again with left shift, we will monitor, it is      likely secondary to #1.  We will check urinalysis with culture and      sensitivity.  3. Hypotension.  Question secondary to volume loss plus or minus      addition of ACE inhibitor last  week.  We will hold ACE inhibitor      and beta-blocker therapy, gentle IV fluid hydration and monitor.  4. Anemia.  The patient with chronic disease and with hematochezia.  I      would  expect drop in hemoglobin overnight with IV fluid hydration.      We will monitor H&H with #1.  5. Dyspnea on exertion with history of coronary artery disease.  The      patient's symptoms seem chronic in nature, however, given acute      illness, we will cycle cardiac enzymes and check TSH.  The patient      will be admitted overnight to telemetry to rule out any arrhythmia.  6. History of hypertension, see problem #3, hold medications.  7. Hypothyroidism.  We will check TSH and continue current dose of      Synthroid.  8. Chronic urinary tract infection.  Given low-grade fever and      increase in white cell count, we will check urinalysis with culture      and sensitivity.  We will hold Macrobid with empiric antibiotics      for #1.  9. Gastroesophageal reflux disease.  We will add PPI.  10.Dyslipidemia.  We will continue patient's Vytorin at discharge.      Continue management per primary care physician.  11.Obstructive sleep apnea.  The patient not on CPAP prior to      admission.  12.Prophylaxis.  We will order SCDs for deep vein thrombosis      prophylaxis and again the patient will be on PPI therapy.      Cordelia Pen, NP      Hollice Espy, M.D.  Electronically Signed    LE/MEDQ  D:  12/10/2008  T:  12/11/2008  Job:  [id number]   cc:   Thora Lance, M.D.

## 2010-10-07 NOTE — H&P (Signed)
NAME:  Gina Bowers, Gina Bowers               ACCOUNT NO.:  [id number]   MEDICAL RECORD NO.:  [id number]          PATIENT TYPE:  INP   LOCATION:  NA                           FACILITY:  [facility]   PHYSICIAN:  Sheliah Plane, MD    DATE OF BIRTH:  06-02-1925   DATE OF ADMISSION:  DATE OF DISCHARGE:                              HISTORY & PHYSICAL   REFERRING PHYSICIAN:  Richard A. Alanda Amass, M.D.   CARDIOLOGIST:  Richard A. Alanda Amass, M.D.   PRIMARY CARE PHYSICIAN:  Celine Mans, M.D., Pramod P. Pearlean Brownie, M.D., and  Nadyne Coombes. Fontaine, M.D.   REASON FOR CONSULTATION:  Coronary occlusive disease.   HISTORY OF PRESENT ILLNESS:  The patient is a 77 year old female who  over the past 6 months has had increasing episodes of feeling weak,  chest heaviness, sometimes with exertion and sometimes it wakes her at  night.  It is also associated with exertional shortness of breath.  Because of the progression of symptoms, she was referred to cardiology.  In early January of 2009, she underwent a Cardiolite stress test which  showed evidence of anterior apical ischemia with preserved LV function.  Cardiac catheterization was performed in the [facility] by  Dr. Alanda Amass, and because of evidence of three-vessel coronary artery  diseases, she is referred for question of coronary artery bypass  grafting.   PREVIOUS CARDIAC HISTORY:  The patient has had no previous myocardial  infarction, previous angioplasty, or previous cardiac surgery.   CARDIAC RISK FACTORS:  She denies any history of hypertension and only  last week was started on Toprol-XL after her catheterization.  She does  have history of hyperlipidemia.  No history of diabetes.  She is a  nonsmoker.   FAMILY HISTORY:  Notable in that her mother developed cardiac disease in  her 56s but lived until she was in her 38s.  She has had four brothers  who have died but none with cardiac disease.  The patient has had a  history of previous  stroke x2 that left her with left leg weakness.  She  is followed by Dr. Pearlean Brownie  for this.  Baseline creatinine is 0.5.   PAST MEDICAL HISTORY:  1. Hypothyroidism.  2. Posterior circulation TIA/stroke x2 in 2003 and 2006, and 2007.  3. She was followed by Dr. Shelle Iron for a question of pulmonary nodules      that had remained stable on CTs for several years, and no further      followup was done.  4. She does have sleep apnea and uses nocturnal oxygen, though she      does not use BiPAP.   PAST SURGICAL HISTORY:  1. Bilateral inguinal repair.  2. Left hip fracture.   SOCIAL HISTORY:  The patient is widowed.  Her husband had bypass surgery  in 1987.  She lives alone but with significant help from family and  friends.  She does not drive.   CURRENT MEDICATIONS:  1. Detrol LA once a day.  2. Vytorin 10/40 once a day.  3. Plavix 75 mg a day.  4. Nexium 40  mg a day.  5. Zoloft 75 mg a day.  6. Reglan 10 mg b.i.d.  7. Synthroid 75 mcg a day.  8. Macrodantin for chronic urinary tract infection 100 mg a day.  9. Just started on Toprol-XL 25 mg a day.  10.Aspirin 81 mg a day.  11.She has nitroglycerin if she needs it.   ALLERGIES:  SULFA WHICH CAUSES A RASH.   REVIEW OF SYSTEMS:  CARDIAC:  Positive for chest pain, resting shortness  of breath, exertional shortness of breath, and mild lower extremity  edema.  Denies palpitations, syncope, presyncope, orthopnea.  GENERAL:  The patient has some has had some weight gain which she attributes to  increased eating over Christmas holidays and general decrease in  activity as she becomes more symptomatic and is unable to exert herself.  HEENT:  Associated with stroke, she does has had some visual changes  that were transient, but she cannot remember the details.  Her daughter  notes that she has had some decreasing memory over the past several  years.  RESPIRATORY:  She occasionally has wheezing.  Denies hemoptysis.  Does have two-pillow  orthopnea.  GI:  Denies any gallbladder discomfort.  Denies hematuria.  EXTREMITIES:  Does have knee and joint and hip pain.  PSYCHIATRIC:  Denies psychiatric history.  Other review of systems are  negative.  She does note that she has had esophageal reflux in the past.  X-ray findings are consistent with a hiatal hernia.  She notes that she  has had endoscopy in the past and was never told of any difficulty  passing the probe.   PHYSICAL EXAMINATION:  VITAL SIGNS:  Blood pressure 136/77, pulse 74,  respiratory rate is 20, O2 saturation 92% on room air.  GENERAL:  The patient is an elderly-appearing female appearing her  stated age.  She is able to relate her history and symptoms well.  NECK:  She has no carotid bruits.  LUNGS:  Breath sounds are distant but without wheezing.  CARDIAC:  Regular rate and rhythm.  ABDOMEN:  Moderately obese without palpable tenderness or masses.  EXTREMITIES:  In the lower extremities, she has +1 DP and PT pulses  bilaterally.  Has some small spider-type superficial varicosities in  both lower extremities.   Cardiac catheterization films are reviewed and show evidence of a 70-  80%, a small circ system but with 70 and 80% stenoses in at least one  and possibly two OM vessels that could be bypassed, a moderate size  right system with 70 and 80% stenosis, and overall preserved LV  function.   Pulmonary function studies were obtained from the [place] office from  2005 and are reasonably adequate, though they are several years old.  We  will repeat them.   The patient has echocardiogram done in the past.  There is no mention of  a bubble study, though the patient's daughter is sure that in the past  she has been told that she had a right heart catheterization and was  told that she possibly had a patent foramen, and then later a bubble  study was done in Dr. Marlis Edelson office.  She was told that she did not  have a patent foramen.  The patient is to have a  repeat echocardiogram  by Dr. Alanda Amass with bubble study this afternoon.  If in fact she does  have a patent foramen or we confirm patent foramen at the time of  surgery, with TEE, I have discussed  with the patient that we will also  close this.   IMPRESSION:  A 77 year old female with significant three-vessel disease  and preserved left ventricular function.  Anatomically, she would  benefit from bypass surgery.  Because of her previous stroke and age,  she is at some increased risk but remains functional.  I have discussed  with the patient's son and daughter in detail the risks of surgery and  that because of her underlying medical problems is at somewhat increased  risk for postop complications but not to such a degree that she should  not have surgery.  She is on Plavix.  We will tentatively plan for  surgery Monday, July 04, 2007 and have her stop her Plavix 7 days  before, continue the aspirin 81 mg a day, and avoid any Advil Motrin,  Aleve, or other nonsteroidals.  The risks of surgery including death,  infection, stroke, myocardial infarction, bleeding, blood transfusion  have all been discussed with the patient and her family in detail.  Their  questions have been answered, and they are willing to proceed.      Sheliah Plane, MD  Electronically Signed     EG/MEDQ  D:  06/23/2007  T:  06/23/2007  Job:  [id number]   cc:   Gerlene Burdock A. Alanda Amass, M.D.  Charles Betts  Timothy P. Fontaine, M.D.  Pramod P. Pearlean Brownie, MD  Barbaraann Share, MD,FCCP

## 2010-10-07 NOTE — Discharge Summary (Signed)
NAME:  Gina Bowers, Gina Bowers               ACCOUNT NO.:  [id number]   MEDICAL RECORD NO.:  [id number]          PATIENT TYPE:  INP   LOCATION:  5528                         FACILITY:  [facility]   PHYSICIAN:  Herbie Saxon, MDDATE OF BIRTH:  Jul 01, 1925   DATE OF ADMISSION:  07/28/2007  DATE OF DISCHARGE:  08/05/2007                               DISCHARGE SUMMARY   DISCHARGE DIAGNOSES:  1. Fever of unknown origin likely secondary to chronic urinary tract      infection.  2. Right thigh cellulitis.  3. Altered mental status, resolved.  4. Coronary artery disease, status post coronary artery bypass graft.  5. Hypothyroidism.  6. Obstructive sleep apnea.  7. Chronic anemia.  8. Sternal osteomyelitis ruled out.   CONSULTATIONS:  Lacretia Leigh. Ninetta Lights, M.D. infectious disease.   RADIOLOGY:  1. The MRI brain of July 30, 2007, showed no acute infarct,.  There is      paranasal sinus mucosal thickening.  2. CT chest on admission showed a small left pleural effusion, no      evidence of chest wall or mediastinal abscess.  There is a small      hiatal hernia.  3. Right extremity ultrasound of July 30, 2007, showed small fluid      collection in the subcutaneous right thigh which could be focal      mass or hematoma.  No well defined abscess.   HOSPITAL COURSE:  This 77 year old Caucasian lady was admitted with  confusion.  She is status post CABG in July 04, 2007.  She is on  Macrodantin for chronic urinary tract infection.  On presentation the  leukocytes were 13,000.  She was started on IV vancomycin and Rocephin  empirically.  Infectious disease consult was sought including antibiotic  coverage.  There was a speculation of possible right thigh abscess but  this was ruled out by the ultrasound.  Fever, confusion, and  leukocytosis indeed improved within 48 hours of admission.  A small  pleural effusion was noticed.  This did not warrant chest tube drainage.  Dr. Tyrone Sage was consulted  for cardiothoracic surgery and he is going to  follow the patient as an outpatient.  Sternal wound from the CABG is  healing well.  The ANA __________  admission was 93.  The patient has  been cleared for discharge by infectious disease.   DISCHARGE CONDITION:  Stable.   DIET:  Low sodium, heart healthy, low cholesterol.   ACTIVITY:  Increased slowly as tolerated.   FOLLOWUP:  1. Dr.  Jenne Pane, primary care physician, in one week.  2. Richard A. Alanda Amass, M.D. cardiology in 4-6 weeks.   Please note the patient has been off antibiotics the last 48 hours.  She  is staying afebrile the last 72 hours.   DISCHARGE MEDICATIONS:  1. Detrol 4 mg daily.  2. Vytorin 10/40 one tablet daily.  3. Plavix 75 mg daily.  4. Nexium 40 mg b.i.d.  5. Zoloft 75 mg daily.  6. Reglan 10 mg daily.  7. Synthroid  75  mcg daily.  8. Macrodantin 100 mg daily.  9. Aspirin 81 mg daily.  10.Toprol XL 12.5 mg daily.  11.Folic acid 1 mg daily.  12.Ultram 12.5 mg q.8 h. p.r.n.  13.Meclizine 25 mg q.12 h. p.r.n.  14.Tylenol 650 mg q.6 h. p.r.n. for fever.   PHYSICAL EXAMINATION:  GENERAL:  Elderly lady not in acute distress,  alert, oriented to time, place, and person.  VITAL SIGNS:  Temperature 98, pulse 64, respiratory rate is 18, blood  pressure 116/72  NECK:  Supple.  HEENT:  Head is atraumatic, normocephalic.  Mucous membranes are moist.  Oropharynx and nasopharynx are clear.  There is no submandibular  lymphadenopathy.  No thyromegaly.  No carotid bruits.  CHEST:  Clinically clear.  HEART:  Sounds 1 and 2 regular rate and rhythm.  ABDOMEN:  Benign.  EXTREMITIES:  Peripheral pulses present.  No edema.   LABORATORY:  WBC 8, hematocrit 30, platelet count 244.  ANA is negative.  Blood culture on July 29, 2007, no growth so far.  Urine culture, August 01, 2007, no growth so far.  Chemistry shows a sodium of 141, potassium  4.2, chloride 105, bicarbonate 21, glucose 88, BUN 8, creatinine 0.5.  RPR  negative.   Discharge time greater than 30 minutes.      Herbie Saxon, MD  Electronically Signed     MIO/MEDQ  D:  08/05/2007  T:  08/05/2007  Job:  [id number]   cc:   Gerlene Burdock A. Alanda Amass, M.D.  Marene Lenz, D.O.

## 2010-10-07 NOTE — Discharge Summary (Signed)
NAME:  Gina Bowers, Gina Bowers               ACCOUNT NO.:  [id number]   MEDICAL RECORD NO.:  [id number]          PATIENT TYPE:  INP   LOCATION:  2020                         FACILITY:  [facility]   PHYSICIAN:  Sheliah Plane, MD    DATE OF BIRTH:  [birth year]   DATE OF ADMISSION:  07/04/2007  DATE OF DISCHARGE:  07/09/2007                               DISCHARGE SUMMARY   HISTORY OF PRESENT ILLNESS:  The patient is a 77 year old female who  over the past 6 months has had increasing episodes of weakness, chest  heaviness, which was sometimes exertional and sometimes would awake her  at night.  Additionally she did have some exertional shortness of  breath.  Because of the progression of her symptoms she was referred to  cardiology.  In early January 2009 she underwent a Cardiolite stress  test which showed evidence of anterior apical ischemia with preserved  left ventricular function.  Cardiac catheterization was performed at the  [facility] by Dr. Alanda Amass and this revealed 3-vessel  coronary artery disease.  The films showed a 70 to 80 percent stenosis  and at least 1 and possibly 2 obtuse marginal vessels that could be by  passed. A moderately sized right system had a 70 to 80 percent stenosis.  Overall there was preserved left ventricular function.  The patient was  recommended to undergo surgical revascularization and was referred to  Sheliah Plane, MD who evaluated the patient and studies and agreed  with recommendations to proceed with surgical revascularization.   PAST MEDICAL HISTORY:  1. Hypothyroidism.  2. Previous TIA/stroke times 2 in the posterior circulation.  3. She has been followed by Dr. Barbaraann Share, MD in the past for      pulmonary nodules with stable CT scans for several years.  4. She does have sleep apnea and uses nocturnal oxygen although not      BIPAP.   PAST SURGICAL HISTORY:  1. Includes bilateral inguinal hernia repair.  2. Left hip fracture.   MEDICATIONS PRIOR TO ADMISSION:  1. Detrol LA  4 mg daily.  2. Vytorin 10/40 once daily.  3. Plavix 75 mg daily.  4. Nexium 40 mg daily.  5. Zoloft 75 mg daily.  6. Reglan 10 mg b.i.d.  7. Synthroid 75 mcg daily.  8. Macrodantin 100 mg daily for chronic urinary tract infection.  9. Recently started on Toprol XL 25 mg daily.  10.Aspirin 81 mg daily.  11.Sublingual nitroglycerin on a p.r.n. basis.   FAMILY AND SOCIAL HISTORY:  Please see the history and physical done at  the time of admission.   REVIEW OF SYSTEMS:  Please see the history and physical done at the time  of admission.   PHYSICAL EXAMINATION:  Please see the history and physical done at the  time of admission.   The cardiac catheterization was reviewed by Dr. Tyrone Sage.  A  echocardiogram was to be done also preoperatively with a bubble study  for possible patent foramen ovale.   HOSPITAL COURSE:  The patient was admitted electively on 07/04/2007 and  taken to the operating  room at which time she underwent the following  procedure:  Coronary artery bypass grafting times 4 with the following  grafts placed:  1. Left internal mammary to the left anterior descending coronary      artery.  2. Saphenous vein graft to the diagonal coronary artery.  3. Saphenous vein graft to the circumflex coronary artery.  4. Saphenous vein graft to the posterior descending coronary artery.   The patient tolerated the procedure well and was taken to the surgical  intensive care unit in stable condition.   POSTOPERATIVE HOSPITAL COURSE:  The patient has overall done quite well.  She was extubated without difficulty. Inotropic support was weaned  without difficulty.  All routine lines, monitors, and drainage devices  have been discontinued in the standard fashion.  She does have a mild  postoperative anemia.  Her values are stable. The most recent hemoglobin  and hematocrit dated 07/07/2007 is 11.6 and 34.3 respectively.  Electrolytes,  BUN and creatinine for he same day is within normal  limits.  Chest x-ray has revealed some atelectasis and small effusions.  She has responded well to a gentle diuresis.  Her incisions are healing  well without evidence of infection.  She is tolerating gradual  increasing activities.  She has been evaluated by physical therapy who  does recommend home health, physical therapy which will be arranged.  The patient has maintained normal sinus rhythm.  Overall the patient's  status is tentatively felt to be stable for discharge in the morning of  07/09/2007 pending morning round re-evaluation.   INSTRUCTIONS:  The patient received written instructions regarding  medications, activity, diet, wound care, and followup.  Followup will  include Dr. Alanda Amass in 2 weeks, Dr. Tyrone Sage in 3 weeks on 08/04/2007  at 3:00 p.m.  A chest x-ray will be obtained at that time.   DISCHARGE MEDICATIONS:  1. Vytorin 10/40 one tablet daily.  2. Ultram 1 to 2 every 6 hours as needed for pain.  3. Reglan 10 mg twice daily.  4. Zoloft 75 mg daily.  5. Synthroid 75 micrograms daily.  6. Macrodantin 100 mg daily.  7. Detrol 4 mg daily.  8. Plavix 75 mg daily.  9. Folic acid  1 mg daily.  10.Stool softener p.r.n.  11.Nexium 40 mg twice daily.   FINAL DIAGNOSES:  1. Severe multi-vessel coronary artery disease.  Now status post      surgical revascularization as described.  2. Other diagnoses include mile postoperative anemia.  3. Hypothyroidism.  4. Previous posterior circulation, TIA/stroke in 2003/2006 and 2007.  5. History of pulmonary nodules.  6. History of sleep apnea.  7. History of bilateral inguinal herniorrhaphy.  8. History of left hip fracture.  9. The patient did not have a patent foramen ovale on preoperative      transesophageal echocardiography      Rowe Clack, P.A.-C.      Sheliah Plane, MD  Electronically Signed    WEG/MEDQ  D:  07/08/2007  T:  07/09/2007  Job:   [id number]   cc:   Lenard Galloway, [facility]

## 2010-10-07 NOTE — Discharge Summary (Signed)
NAME:  BRIELL, PAULETTE               ACCOUNT NO.:  [id number]   MEDICAL RECORD NO.:  [id number]          PATIENT TYPE:  INP   LOCATION:  4715                         FACILITY:  [facility]   PHYSICIAN:  Thora Lance, M.D.  DATE OF BIRTH:  07-07-1925   DATE OF ADMISSION:  12/10/2008  DATE OF DISCHARGE:  12/13/2008                               DISCHARGE SUMMARY   REASON FOR ADMISSION:  An 77 year old white female who presented with a  2-day history of nausea, vomiting, abdominal pain and then in the  morning of admission have bright red blood per rectum.  The patient had  been started on lisinopril about a week prior to admission.  A low-grade  fever of 100.0 with a nonproductive cough was reported by the patient's  daughter.  The patient also complained of some orthostatic dizziness for  several weeks prior to admission.   SIGNIFICANT FINDINGS:  Blood pressure 88/57 up to 112/43, heart rate 62,  respirations 18, temperature 98.0.  Abdomen showed mild left lower  quadrant tenderness.  Otherwise normal exam.  CBC; WBC 13.1, hemoglobin  11.4.  Sodium 138, potassium 4.0, chloride 105, bicarbonate 26, BUN 11,  creatinine 0.6, glucose 113.  Liver function tests were normal.  Calcium  8.3.  Chest x-ray, no acute disease.  EKG, normal sinus rhythm.   HOSPITAL COURSE:  Ischemic colitis.  The patient was admitted with  abdominal pain, hematochezia, nausea, and vomiting.  She was empirically  started on ciprofloxacin and Flagyl.  Her antihypertensives were held  and she was hypotensive at admission.  CT scan of the abdomen was done,  which showed a long segment of circumferential thickening in the left  colon most consistent with ischemic colitis.  The patient was seen by  [facility].  Their impression was ischemic colitis.  Further GI  workup was not recommended.  Antibiotics were discontinued.  Gastroenterology did recommend stopping the patient's ACE inhibitor,  which had been done.  At  discharge, the patient's hemoglobin had  stabilized at 10.2.  Her renal function was normal and her IV fluids  were discontinued.  Her blood pressure at discharge was 126/53.  The  patient's Plavix and aspirin with all will be held for a total of 1 week  and then restarted on December 17, 2008.  The patient does have chronic  constipation and can use MiraLax at home on an as-needed basis.  She did  complain of a mild dry cough at discharge, which will be followed and  worked up as an outpatient as needed.   DISCHARGE DIAGNOSES:  1. Ischemic colitis.  2. Coronary artery disease, bypass surgery, February 2009.  3. Hypertension.  4. Hypothyroidism.  5. Obstructive sleep apnea.  6. Chronic anemia.  7. Chronic urinary tract infections.  8. Gastroesophageal reflux disease.  9. Dyslipidemia.   PROCEDURES:  CT scan of the abdomen and pelvis.   DISCHARGE MEDICATIONS:  1. Nitroglycerin p.r.n.  2. Prevacid 30 mg daily.  3. Plavix 75 mg daily held until December 17, 2008.  4. Synthroid 0.075 mg a day.  5. Zoloft 50  mg one and a half a day.  6. Detrol LA 4 mg a day.  7. Metoprolol ER 12.5 mg a day.  8. Vytorin 10/40 once a day.  9. Macrodantin 100 mg a day.  10.Aspirin 81 mg a day, held until May 19, 2009.  11.Stool softener two at bedtime.  12.Lamisil 250 mg once a day.  13.Multivitamin two pills a day.  14.Clotrimazole/betamethasone cream 1% three times a day.   DISPOSITION:  Discharged to home with follow up 2 weeks Dr. Valentina Lucks.   DIET:  Heart-healthy low-sodium diet.   ACTIVITY:  Advancing slowly.           ______________________________  Thora Lance, M.D.     JJG/MEDQ  D:  12/13/2008  T:  12/13/2008  Job:  [id number]   cc:   Gerlene Burdock A. Alanda Amass, M.D.  Wilhemina Bonito. Marina Goodell, MD

## 2010-10-10 NOTE — Procedures (Signed)
NAME:  Gina Bowers, Gina Bowers NO.:  [id number]   MEDICAL RECORD NO.:  [id number]          PATIENT TYPE:  OUT   LOCATION:  SLEEP CENTER                 [facility]ACILITY[facility]  [facility]   PHYSICIAN:  Marcelyn Bruins, M.D. [facility] DATE OF BIRTH:  02/28/1926   DATE OF STUDY:  01/05/2005                              NOCTURNAL POLYSOMNOGRAM   DATE OF STUDY:  January 05, 2005.   REFERRING PHYSICIAN:  Dr. Marcelyn Bruins.   INDICATION FOR STUDY:  Persistent disorder of initiating and maintaining  wakefulness. Epworth sleepiness score: 13.   SLEEP ARCHITECTURE:  The patient had total sleep time of 243 minutes with  decreased slow wave sleep and REM. Sleep onset latency was very prolonged at  98 minutes and REM onset was prolonged at 194 minutes. Sleep efficiency was  only 61%.   IMPRESSION:  1.  Mild obstructive sleep apnea/hypopnea syndrome with a respiratory      disturbance index of 9 events per hour and O2 desaturation as low as      84%. The events were not positional.  2.  Moderate snoring noted throughout the study.  3.  No clinically significant cardiac arrhythmia.  4.  It should be noted the patient had O2 saturation between 81 to 90% on      the study for approximately 163 minutes. The patient normally wears      oxygen at night as an outpatient; however, this particular study was      done on room air.           ______________________________  Marcelyn Bruins, M.D. [facility]  Diplomate, American Board of Sleep  Medicine     KC/MEDQ  D:  01/08/2005 16:43:48  T:  01/08/2005 23:21:39  Job:  [id number]

## 2010-10-24 HISTORY — PX: CORONARY ANGIOPLASTY: SHX604

## 2011-02-13 LAB — POCT I-STAT 4, (NA,K, GLUC, HGB,HCT)
Glucose, Bld: 100 — ABNORMAL HIGH
Glucose, Bld: 107 — ABNORMAL HIGH
Glucose, Bld: 118 — ABNORMAL HIGH
Glucose, Bld: 119 — ABNORMAL HIGH
Glucose, Bld: 142 — ABNORMAL HIGH
Glucose, Bld: 90
Glucose, Bld: 97
HCT: 22 — ABNORMAL LOW
HCT: 23 — ABNORMAL LOW
HCT: 24 — ABNORMAL LOW
HCT: 25 — ABNORMAL LOW
HCT: 26 — ABNORMAL LOW
HCT: 31 — ABNORMAL LOW
HCT: 35 — ABNORMAL LOW
Hemoglobin: 10.5 — ABNORMAL LOW
Hemoglobin: 11.9 — ABNORMAL LOW
Hemoglobin: 7.5 — CL
Hemoglobin: 7.8 — CL
Hemoglobin: 8.2 — ABNORMAL LOW
Hemoglobin: 8.5 — ABNORMAL LOW
Hemoglobin: 8.8 — ABNORMAL LOW
Operator id: 257021
Operator id: 3342
Operator id: 3342
Operator id: 3342
Operator id: 3342
Operator id: 3342
Operator id: 3342
Potassium: 3.4 — ABNORMAL LOW
Potassium: 3.7
Potassium: 4.1
Potassium: 4.4
Potassium: 4.8
Potassium: 5.2 — ABNORMAL HIGH
Potassium: 5.2 — ABNORMAL HIGH
Sodium: 131 — ABNORMAL LOW
Sodium: 132 — ABNORMAL LOW
Sodium: 136
Sodium: 138
Sodium: 138
Sodium: 140
Sodium: 140

## 2011-02-13 LAB — BLOOD GAS, ARTERIAL
Acid-Base Excess: 3.1 — ABNORMAL HIGH
Acid-Base Excess: 3.7 — ABNORMAL HIGH
Bicarbonate: 27.6 — ABNORMAL HIGH
Bicarbonate: 28.1 — ABNORMAL HIGH
Drawn by: 206361
Drawn by: 242311
FIO2: 0.21
FIO2: 0.21
O2 Saturation: 89.7
O2 Saturation: 91.2
Patient temperature: 98.6
Patient temperature: 98.6
TCO2: 29
TCO2: 29.5
pCO2 arterial: 45.7 — ABNORMAL HIGH
pCO2 arterial: 46.3 — ABNORMAL HIGH
pH, Arterial: 7.393
pH, Arterial: 7.406 — ABNORMAL HIGH
pO2, Arterial: 60.9 — ABNORMAL LOW
pO2, Arterial: 70.5 — ABNORMAL LOW

## 2011-02-13 LAB — POCT I-STAT 3, VENOUS BLOOD GAS (G3P V)
Acid-base deficit: 1
Bicarbonate: 25.7 — ABNORMAL HIGH
O2 Saturation: 83
Operator id: 3342
TCO2: 27
pCO2, Ven: 52.1 — ABNORMAL HIGH
pH, Ven: 7.301 — ABNORMAL HIGH
pO2, Ven: 54 — ABNORMAL HIGH

## 2011-02-13 LAB — PROTIME-INR
INR: 0.9
INR: 1.2
Prothrombin Time: 12.4
Prothrombin Time: 15.7 — ABNORMAL HIGH

## 2011-02-13 LAB — TYPE AND SCREEN
ABO/RH(D): A POS
Antibody Screen: NEGATIVE

## 2011-02-13 LAB — BASIC METABOLIC PANEL
BUN: 10
BUN: 11
BUN: 7
BUN: 9
CO2: 28
CO2: 28
CO2: 29
CO2: 30
Calcium: 7.8 — ABNORMAL LOW
Calcium: 7.9 — ABNORMAL LOW
Calcium: 8 — ABNORMAL LOW
Calcium: 8.2 — ABNORMAL LOW
Chloride: 100
Chloride: 101
Chloride: 102
Chloride: 108
Creatinine, Ser: 0.51
Creatinine, Ser: 0.63
Creatinine, Ser: 0.7
Creatinine, Ser: 0.73
GFR calc Af Amer: >60
GFR calc Af Amer: >60
GFR calc Af Amer: >60
GFR calc Af Amer: >60
GFR calc non Af Amer: >60
GFR calc non Af Amer: >60
GFR calc non Af Amer: >60
GFR calc non Af Amer: >60
Glucose, Bld: 106 — ABNORMAL HIGH
Glucose, Bld: 108 — ABNORMAL HIGH
Glucose, Bld: 133 — ABNORMAL HIGH
Glucose, Bld: 99
Potassium: 3.4 — ABNORMAL LOW
Potassium: 3.5
Potassium: 3.9
Potassium: 4.1
Sodium: 135
Sodium: 136
Sodium: 139
Sodium: 141

## 2011-02-13 LAB — COMPREHENSIVE METABOLIC PANEL
ALT: 16
AST: 20
Albumin: 3.4 — ABNORMAL LOW
Alkaline Phosphatase: 74
BUN: 20
CO2: 25
Calcium: 9.1
Chloride: 101
Creatinine, Ser: 0.71
GFR calc Af Amer: >60
GFR calc non Af Amer: >60
Glucose, Bld: 111 — ABNORMAL HIGH
Potassium: 4.3
Sodium: 136
Total Bilirubin: 0.7
Total Protein: 6.3

## 2011-02-13 LAB — URINALYSIS, ROUTINE W REFLEX MICROSCOPIC
Bilirubin Urine: NEGATIVE
Bilirubin Urine: NEGATIVE
Glucose, UA: NEGATIVE
Glucose, UA: NEGATIVE
Hgb urine dipstick: NEGATIVE
Hgb urine dipstick: NEGATIVE
Ketones, ur: NEGATIVE
Ketones, ur: NEGATIVE
Nitrite: NEGATIVE
Nitrite: NEGATIVE
Protein, ur: NEGATIVE
Protein, ur: NEGATIVE
Specific Gravity, Urine: 1.017
Specific Gravity, Urine: 1.021
Urobilinogen, UA: 0.2
Urobilinogen, UA: 1
pH: 6
pH: 6

## 2011-02-13 LAB — CBC
HCT: 31.1 — ABNORMAL LOW
HCT: 32.4 — ABNORMAL LOW
HCT: 32.4 — ABNORMAL LOW
HCT: 32.5 — ABNORMAL LOW
HCT: 33.3 — ABNORMAL LOW
HCT: 34.3 — ABNORMAL LOW
HCT: 37.1
Hemoglobin: 10.5 — ABNORMAL LOW
Hemoglobin: 10.9 — ABNORMAL LOW
Hemoglobin: 10.9 — ABNORMAL LOW
Hemoglobin: 11 — ABNORMAL LOW
Hemoglobin: 11.3 — ABNORMAL LOW
Hemoglobin: 11.6 — ABNORMAL LOW
Hemoglobin: 12.6
MCHC: 33.5
MCHC: 33.6
MCHC: 33.6
MCHC: 33.7
MCHC: 34
MCHC: 34
MCHC: 34
MCV: 90.9
MCV: 91
MCV: 91.4
MCV: 91.6
MCV: 92
MCV: 92
MCV: 92
Platelets: 119 — ABNORMAL LOW
Platelets: 125 — ABNORMAL LOW
Platelets: 125 — ABNORMAL LOW
Platelets: 128 — ABNORMAL LOW
Platelets: 142 — ABNORMAL LOW
Platelets: 145 — ABNORMAL LOW
Platelets: 290
RBC: 3.4 — ABNORMAL LOW
RBC: 3.52 — ABNORMAL LOW
RBC: 3.55 — ABNORMAL LOW
RBC: 3.56 — ABNORMAL LOW
RBC: 3.66 — ABNORMAL LOW
RBC: 3.73 — ABNORMAL LOW
RBC: 4.04
RDW: 13.3
RDW: 14.1
RDW: 14.1
RDW: 14.5
RDW: 14.5
RDW: 14.6
RDW: 14.9
WBC: 10.7 — ABNORMAL HIGH
WBC: 13 — ABNORMAL HIGH
WBC: 16.6 — ABNORMAL HIGH
WBC: 17.3 — ABNORMAL HIGH
WBC: 17.5 — ABNORMAL HIGH
WBC: 17.6 — ABNORMAL HIGH
WBC: 18.2 — ABNORMAL HIGH

## 2011-02-13 LAB — I-STAT EC8
Acid-base deficit: 4 — ABNORMAL HIGH
BUN: 10
BUN: 11
Bicarbonate: 21.9
Bicarbonate: 26.5 — ABNORMAL HIGH
Chloride: 102
Chloride: 106
Glucose, Bld: 134 — ABNORMAL HIGH
Glucose, Bld: 178 — ABNORMAL HIGH
HCT: 33 — ABNORMAL LOW
HCT: 34 — ABNORMAL LOW
Hemoglobin: 11.2 — ABNORMAL LOW
Hemoglobin: 11.6 — ABNORMAL LOW
Operator id: 257021
Operator id: 257021
Potassium: 4
Potassium: 4.4
Sodium: 136
Sodium: 138
TCO2: 23
TCO2: 28
pCO2 arterial: 42.2
pCO2 arterial: 49.1 — ABNORMAL HIGH
pH, Arterial: 7.324 — ABNORMAL LOW
pH, Arterial: 7.34 — ABNORMAL LOW

## 2011-02-13 LAB — POCT I-STAT GLUCOSE
Glucose, Bld: 133 — ABNORMAL HIGH
Operator id: 3342

## 2011-02-13 LAB — POCT I-STAT 3, ART BLOOD GAS (G3+)
Acid-Base Excess: 1
Acid-Base Excess: 1
Acid-base deficit: 2
Acid-base deficit: 3 — ABNORMAL HIGH
Bicarbonate: 23
Bicarbonate: 23.5
Bicarbonate: 24.2 — ABNORMAL HIGH
Bicarbonate: 24.6 — ABNORMAL HIGH
Bicarbonate: 25.5 — ABNORMAL HIGH
Bicarbonate: 25.8 — ABNORMAL HIGH
O2 Saturation: 100
O2 Saturation: 89
O2 Saturation: 94
O2 Saturation: 95
O2 Saturation: 95
O2 Saturation: 97
Operator id: 128731
Operator id: 196291
Operator id: 205501
Operator id: 257021
Operator id: 3342
Operator id: 3342
Patient temperature: 35.5
Patient temperature: 37.3
Patient temperature: 37.6
Patient temperature: 38.1
TCO2: 24
TCO2: 25
TCO2: 25
TCO2: 26
TCO2: 27
TCO2: 27
pCO2 arterial: 33.6 — ABNORMAL LOW
pCO2 arterial: 35.5
pCO2 arterial: 41.3
pCO2 arterial: 41.8
pCO2 arterial: 43
pCO2 arterial: 49.2 — ABNORMAL HIGH
pH, Arterial: 7.329 — ABNORMAL LOW
pH, Arterial: 7.34 — ABNORMAL LOW
pH, Arterial: 7.364
pH, Arterial: 7.398
pH, Arterial: 7.441 — ABNORMAL HIGH
pH, Arterial: 7.467 — ABNORMAL HIGH
pO2, Arterial: 250 — ABNORMAL HIGH
pO2, Arterial: 63 — ABNORMAL LOW
pO2, Arterial: 67 — ABNORMAL LOW
pO2, Arterial: 76 — ABNORMAL LOW
pO2, Arterial: 83
pO2, Arterial: 88

## 2011-02-13 LAB — URINE MICROSCOPIC-ADD ON

## 2011-02-13 LAB — CREATININE, SERUM
Creatinine, Ser: 0.49
GFR calc Af Amer: >60
GFR calc non Af Amer: >60

## 2011-02-13 LAB — ABO/RH: ABO/RH(D): A POS

## 2011-02-13 LAB — MAGNESIUM
Magnesium: 2.6 — ABNORMAL HIGH
Magnesium: 2.6 — ABNORMAL HIGH
Magnesium: 3.1 — ABNORMAL HIGH

## 2011-02-13 LAB — HEMOGLOBIN A1C
Hgb A1c MFr Bld: 5.6
Mean Plasma Glucose: 122

## 2011-02-13 LAB — URINE CULTURE

## 2011-02-13 LAB — HEMOGLOBIN AND HEMATOCRIT, BLOOD
HCT: 24.1 — ABNORMAL LOW
Hemoglobin: 8.1 — ABNORMAL LOW

## 2011-02-13 LAB — APTT
aPTT: 30
aPTT: 87 — ABNORMAL HIGH

## 2011-02-13 LAB — PLATELET COUNT: Platelets: 110 — ABNORMAL LOW

## 2011-02-16 LAB — COMPREHENSIVE METABOLIC PANEL
ALT: 18
AST: 17
AST: 23
Albumin: 2.6 — ABNORMAL LOW
Albumin: 3.1 — ABNORMAL LOW
Alkaline Phosphatase: 77
Alkaline Phosphatase: 97
BUN: 12
BUN: 7
CO2: 28
Chloride: 101
Chloride: 103
Creatinine, Ser: 0.51
GFR calc Af Amer: >60
GFR calc non Af Amer: >60
Potassium: 3.6
Potassium: 4
Sodium: 137
Total Bilirubin: 0.5
Total Bilirubin: 0.5

## 2011-02-16 LAB — BASIC METABOLIC PANEL
CO2: 28
CO2: 31
Chloride: 105
Chloride: 106
GFR calc Af Amer: >60
GFR calc Af Amer: >60
GFR calc non Af Amer: >60
Glucose, Bld: 101 — ABNORMAL HIGH
Sodium: 141

## 2011-02-16 LAB — CBC
HCT: 28.8 — ABNORMAL LOW
HCT: 29.8 — ABNORMAL LOW
HCT: 31.6 — ABNORMAL LOW
HCT: 35.3 — ABNORMAL LOW
Hemoglobin: 10.1 — ABNORMAL LOW
Hemoglobin: 10.1 — ABNORMAL LOW
Hemoglobin: 11.6 — ABNORMAL LOW
Hemoglobin: 9.7 — ABNORMAL LOW
MCHC: 32.9
MCHC: 33.6
MCHC: 33.8
MCV: 91.4
MCV: 91.6
MCV: 91.8
Platelets: 233
RBC: 3.14 — ABNORMAL LOW
RBC: 3.27 — ABNORMAL LOW
RBC: 3.44 — ABNORMAL LOW
RDW: 14
RDW: 14
WBC: 8.3
WBC: 8.5
WBC: 9.1

## 2011-02-16 LAB — I-STAT 8, (EC8 V) (CONVERTED LAB)
Bicarbonate: 28.4 — ABNORMAL HIGH
Glucose, Bld: 171 — ABNORMAL HIGH
TCO2: 30
pH, Ven: 7.454 — ABNORMAL HIGH

## 2011-02-16 LAB — CULTURE, BLOOD (ROUTINE X 2)

## 2011-02-16 LAB — URINALYSIS, ROUTINE W REFLEX MICROSCOPIC
Glucose, UA: NEGATIVE
Ketones, ur: NEGATIVE
Protein, ur: NEGATIVE

## 2011-02-16 LAB — MPO/PR-3 (ANCA) ANTIBODIES: Serine Protease 3: <3.5 U/mL (ref <3.5)

## 2011-02-16 LAB — PHOSPHORUS: Phosphorus: 3

## 2011-02-16 LAB — ANTI-NEUTROPHIL ANTIBODY: Cytoplasmic Neutrophilic Ab: <1:20 {titer}

## 2011-02-16 LAB — TSH: TSH: 1.003

## 2011-06-04 DIAGNOSIS — I2510 Atherosclerotic heart disease of native coronary artery without angina pectoris: Secondary | ICD-10-CM | POA: Diagnosis not present

## 2011-06-04 DIAGNOSIS — E782 Mixed hyperlipidemia: Secondary | ICD-10-CM | POA: Diagnosis not present

## 2011-06-09 DIAGNOSIS — L851 Acquired keratosis [keratoderma] palmaris et plantaris: Secondary | ICD-10-CM | POA: Diagnosis not present

## 2011-06-17 DIAGNOSIS — N3020 Other chronic cystitis without hematuria: Secondary | ICD-10-CM | POA: Diagnosis not present

## 2011-06-17 DIAGNOSIS — N3941 Urge incontinence: Secondary | ICD-10-CM | POA: Diagnosis not present

## 2011-06-17 DIAGNOSIS — R350 Frequency of micturition: Secondary | ICD-10-CM | POA: Diagnosis not present

## 2011-06-26 DIAGNOSIS — T148XXA Other injury of unspecified body region, initial encounter: Secondary | ICD-10-CM

## 2011-06-26 HISTORY — DX: Other injury of unspecified body region, initial encounter: T14.8XXA

## 2011-06-30 DIAGNOSIS — I872 Venous insufficiency (chronic) (peripheral): Secondary | ICD-10-CM | POA: Diagnosis not present

## 2011-06-30 DIAGNOSIS — E785 Hyperlipidemia, unspecified: Secondary | ICD-10-CM | POA: Diagnosis not present

## 2011-06-30 DIAGNOSIS — I2510 Atherosclerotic heart disease of native coronary artery without angina pectoris: Secondary | ICD-10-CM | POA: Diagnosis not present

## 2011-06-30 DIAGNOSIS — K219 Gastro-esophageal reflux disease without esophagitis: Secondary | ICD-10-CM | POA: Diagnosis not present

## 2011-07-01 DIAGNOSIS — M25579 Pain in unspecified ankle and joints of unspecified foot: Secondary | ICD-10-CM | POA: Diagnosis not present

## 2011-07-01 DIAGNOSIS — S93409A Sprain of unspecified ligament of unspecified ankle, initial encounter: Secondary | ICD-10-CM | POA: Diagnosis not present

## 2011-07-06 DIAGNOSIS — R0989 Other specified symptoms and signs involving the circulatory and respiratory systems: Secondary | ICD-10-CM | POA: Diagnosis not present

## 2011-07-29 DIAGNOSIS — N3020 Other chronic cystitis without hematuria: Secondary | ICD-10-CM | POA: Diagnosis not present

## 2011-07-29 DIAGNOSIS — N3941 Urge incontinence: Secondary | ICD-10-CM | POA: Diagnosis not present

## 2011-07-29 DIAGNOSIS — R350 Frequency of micturition: Secondary | ICD-10-CM | POA: Diagnosis not present

## 2011-08-03 ENCOUNTER — Other Ambulatory Visit: Payer: Self-pay | Admitting: Orthopaedic Surgery

## 2011-08-03 DIAGNOSIS — M25579 Pain in unspecified ankle and joints of unspecified foot: Secondary | ICD-10-CM | POA: Diagnosis not present

## 2011-08-03 DIAGNOSIS — M25572 Pain in left ankle and joints of left foot: Secondary | ICD-10-CM

## 2011-08-05 DIAGNOSIS — R350 Frequency of micturition: Secondary | ICD-10-CM | POA: Diagnosis not present

## 2011-08-07 ENCOUNTER — Other Ambulatory Visit: Payer: Self-pay

## 2011-08-08 ENCOUNTER — Other Ambulatory Visit: Payer: Self-pay

## 2011-08-27 DIAGNOSIS — N952 Postmenopausal atrophic vaginitis: Secondary | ICD-10-CM | POA: Diagnosis not present

## 2011-08-27 DIAGNOSIS — R350 Frequency of micturition: Secondary | ICD-10-CM | POA: Diagnosis not present

## 2011-08-27 DIAGNOSIS — N3941 Urge incontinence: Secondary | ICD-10-CM | POA: Diagnosis not present

## 2011-08-27 DIAGNOSIS — N3020 Other chronic cystitis without hematuria: Secondary | ICD-10-CM | POA: Diagnosis not present

## 2011-09-01 ENCOUNTER — Other Ambulatory Visit: Payer: Self-pay | Admitting: Urology

## 2011-09-14 ENCOUNTER — Encounter (HOSPITAL_COMMUNITY): Payer: Self-pay | Admitting: Pharmacy Technician

## 2011-09-16 ENCOUNTER — Ambulatory Visit (HOSPITAL_COMMUNITY)
Admission: RE | Admit: 2011-09-16 | Discharge: 2011-09-16 | Disposition: A | Discharge status: Home / Self Care | Payer: Medicare Other | Source: Ambulatory Visit | Attending: Urology | Admitting: Urology

## 2011-09-16 ENCOUNTER — Encounter (HOSPITAL_COMMUNITY)
Admission: RE | Admit: 2011-09-16 | Discharge: 2011-09-16 | Disposition: A | Discharge status: Home / Self Care | Payer: Medicare Other | Source: Ambulatory Visit | Attending: Urology | Admitting: Urology

## 2011-09-16 ENCOUNTER — Encounter (HOSPITAL_COMMUNITY): Payer: Self-pay

## 2011-09-16 DIAGNOSIS — I10 Essential (primary) hypertension: Secondary | ICD-10-CM | POA: Insufficient documentation

## 2011-09-16 DIAGNOSIS — Z01818 Encounter for other preprocedural examination: Secondary | ICD-10-CM | POA: Diagnosis not present

## 2011-09-16 DIAGNOSIS — Z01812 Encounter for preprocedural laboratory examination: Secondary | ICD-10-CM | POA: Insufficient documentation

## 2011-09-16 DIAGNOSIS — Z01811 Encounter for preprocedural respiratory examination: Secondary | ICD-10-CM | POA: Diagnosis not present

## 2011-09-16 DIAGNOSIS — Z951 Presence of aortocoronary bypass graft: Secondary | ICD-10-CM | POA: Insufficient documentation

## 2011-09-16 HISTORY — DX: Gastro-esophageal reflux disease without esophagitis: K21.9

## 2011-09-16 HISTORY — DX: Unspecified osteoarthritis, unspecified site: M19.90

## 2011-09-16 HISTORY — DX: Major depressive disorder, single episode, unspecified: F32.9

## 2011-09-16 HISTORY — DX: Depression, unspecified: F32.A

## 2011-09-16 HISTORY — DX: Pure hypercholesterolemia, unspecified: E78.00

## 2011-09-16 HISTORY — DX: Acute gastroenteropathy due to Norwalk agent: A08.11

## 2011-09-16 HISTORY — DX: Other nonspecific abnormal finding of lung field: R91.8

## 2011-09-16 HISTORY — DX: Dizziness and giddiness: R42

## 2011-09-16 HISTORY — DX: Shortness of breath: R06.02

## 2011-09-16 HISTORY — DX: Encounter for other specified aftercare: Z51.89

## 2011-09-16 HISTORY — DX: Other injury of unspecified body region, initial encounter: T14.8XXA

## 2011-09-16 HISTORY — DX: Calculus of kidney: N20.0

## 2011-09-16 HISTORY — DX: Other specified symptoms and signs involving the circulatory and respiratory systems: R09.89

## 2011-09-16 HISTORY — DX: Urinary tract infection, site not specified: N39.0

## 2011-09-16 HISTORY — DX: Cerebral infarction, unspecified: I63.9

## 2011-09-16 HISTORY — DX: Atherosclerotic heart disease of native coronary artery without angina pectoris: I25.10

## 2011-09-16 HISTORY — DX: Hypothyroidism, unspecified: E03.9

## 2011-09-16 LAB — BASIC METABOLIC PANEL
CO2: 30 mEq/L (ref 19–32)
Chloride: 105 mEq/L (ref 96–112)
Glucose, Bld: 101 mg/dL — ABNORMAL HIGH (ref 70–99)
Potassium: 4.3 mEq/L (ref 3.5–5.1)
Sodium: 141 mEq/L (ref 135–145)

## 2011-09-16 LAB — CBC
HCT: 37.7 % (ref 36.0–46.0)
Hemoglobin: 12.3 g/dL (ref 12.0–15.0)
RBC: 3.99 MIL/uL (ref 3.87–5.11)

## 2011-09-16 LAB — SURGICAL PCR SCREEN
MRSA, PCR: NEGATIVE
Staphylococcus aureus: POSITIVE — AB

## 2011-09-16 NOTE — Patient Instructions (Signed)
YOUR SURGERY IS SCHEDULED ON:  Monday  4/29  AT 8:30 AM  REPORT TO [place] [facility] AT: 6:30 AM      PHONE # FOR SHORT STAY IS [phone]  DO NOT EAT OR DRINK ANYTHING AFTER MIDNIGHT THE NIGHT BEFORE YOUR SURGERY.  YOU MAY BRUSH YOUR TEETH, RINSE OUT YOUR MOUTH--BUT NO WATER, NO FOOD, NO CHEWING GUM, NO MINTS, NO CANDIES, NO CHEWING TOBACCO.  PLEASE TAKE THE FOLLOWING MEDICATIONS THE AM OF YOUR SURGERY WITH A FEW SIPS OF WATER:  LEVOTHYROXINE AND PANTOPRAZOLE    IF YOU USE INHALERS--USE YOUR INHALERS THE AM OF YOUR SURGERY AND BRING INHALERS TO THE HOSPITAL -TAKE TO SURGERY.    IF YOU ARE DIABETIC:  DO NOT TAKE ANY DIABETIC MEDICATIONS THE AM OF YOUR SURGERY.  IF YOU TAKE INSULIN IN THE EVENINGS--PLEASE ONLY TAKE 1/2 NORMAL EVENING DOSE THE NIGHT BEFORE YOUR SURGERY.  NO INSULIN THE AM OF YOUR SURGERY.  IF YOU HAVE SLEEP APNEA AND USE CPAP OR BIPAP--PLEASE BRING THE MASK --NOT THE MACHINE-NOT THE TUBING   -JUST THE MASK. DO NOT BRING VALUABLES, MONEY, CREDIT CARDS.  CONTACT LENS, DENTURES / PARTIALS, GLASSES SHOULD NOT BE WORN TO SURGERY AND IN MOST CASES-HEARING AIDS WILL NEED TO BE REMOVED.  BRING YOUR GLASSES CASE, ANY EQUIPMENT NEEDED FOR YOUR CONTACT LENS. FOR PATIENTS ADMITTED TO THE HOSPITAL--CHECK OUT TIME THE DAY OF DISCHARGE IS 11:00 AM.  ALL INPATIENT ROOMS ARE PRIVATE - WITH BATHROOM, TELEPHONE, TELEVISION AND WIFI INTERNET. IF YOU ARE BEING DISCHARGED THE SAME DAY OF YOUR SURGERY--YOU CAN NOT DRIVE YOURSELF HOME--AND SHOULD NOT GO HOME ALONE BY TAXI OR BUS.  NO DRIVING OR OPERATING MACHINERY FOR 24 HOURS FOLLOWING ANESTHESIA / PAIN MEDICATIONS.                            SPECIAL INSTRUCTIONS:  CHLORHEXIDINE SOAP SHOWER (other brand names are Betasept and Hibiclens ) PLEASE SHOWER WITH CHLORHEXIDINE THE NIGHT BEFORE YOUR SURGERY AND THE AM OF YOUR SURGERY. DO NOT USE CHLORHEXIDINE ON YOUR FACE OR PRIVATE AREAS--YOU MAY USE YOUR NORMAL SOAP THOSE AREAS AND YOUR NORMAL  SHAMPOO.  WOMEN SHOULD AVOID SHAVING UNDER ARMS AND SHAVING LEGS 48 HOURS BEFORE USING CHLORHEXIDINE TO AVOID SKIN IRRITATION.  DO NOT USE IF ALLERGIC TO CHLORHEXIDINE.  PLEASE READ OVER ANY  FACT SHEETS THAT YOU WERE GIVEN: MRSA INFORMATION

## 2011-09-16 NOTE — Pre-Procedure Instructions (Addendum)
EKG AND CARDIOLOGY OFFICE NOTE 06/04/11, ECHO REPORT 11/27/10 AND STRESS TEST REPORT 12/21/08 ON PT'S CHART FROM DR. Alanda Amass & [facility].  CBC, BMET AND CXR WERE DONE TODAY AT [facility] PREOP. NOTE OF CARDIAC CLEARANCE FOR SURGERY WITH DR. TANNENBAUM ON PT'S CHART FROM DR. Alanda Amass.

## 2011-09-21 ENCOUNTER — Encounter (HOSPITAL_COMMUNITY): Payer: Self-pay | Admitting: Anesthesiology

## 2011-09-21 ENCOUNTER — Encounter (HOSPITAL_COMMUNITY): Payer: Self-pay | Admitting: *Deleted

## 2011-09-21 ENCOUNTER — Encounter (HOSPITAL_COMMUNITY)
Admission: RE | Disposition: A | Discharge status: Home / Self Care | Payer: Self-pay | Source: Ambulatory Visit | Attending: Urology

## 2011-09-21 ENCOUNTER — Ambulatory Visit (HOSPITAL_COMMUNITY): Payer: Medicare Other | Admitting: Anesthesiology

## 2011-09-21 ENCOUNTER — Ambulatory Visit (HOSPITAL_COMMUNITY)
Admission: RE | Admit: 2011-09-21 | Discharge: 2011-09-21 | Disposition: A | Discharge status: Home / Self Care | Payer: Medicare Other | Source: Ambulatory Visit | Attending: Urology | Admitting: Urology

## 2011-09-21 DIAGNOSIS — E785 Hyperlipidemia, unspecified: Secondary | ICD-10-CM

## 2011-09-21 DIAGNOSIS — N3642 Intrinsic sphincter deficiency (ISD): Secondary | ICD-10-CM | POA: Insufficient documentation

## 2011-09-21 DIAGNOSIS — Z7982 Long term (current) use of aspirin: Secondary | ICD-10-CM | POA: Diagnosis not present

## 2011-09-21 DIAGNOSIS — Z951 Presence of aortocoronary bypass graft: Secondary | ICD-10-CM | POA: Insufficient documentation

## 2011-09-21 DIAGNOSIS — E039 Hypothyroidism, unspecified: Secondary | ICD-10-CM | POA: Insufficient documentation

## 2011-09-21 DIAGNOSIS — G4733 Obstructive sleep apnea (adult) (pediatric): Secondary | ICD-10-CM

## 2011-09-21 DIAGNOSIS — R0902 Hypoxemia: Secondary | ICD-10-CM

## 2011-09-21 DIAGNOSIS — Z79899 Other long term (current) drug therapy: Secondary | ICD-10-CM | POA: Diagnosis not present

## 2011-09-21 DIAGNOSIS — K219 Gastro-esophageal reflux disease without esophagitis: Secondary | ICD-10-CM | POA: Diagnosis not present

## 2011-09-21 DIAGNOSIS — I10 Essential (primary) hypertension: Secondary | ICD-10-CM | POA: Diagnosis not present

## 2011-09-21 DIAGNOSIS — N3946 Mixed incontinence: Secondary | ICD-10-CM | POA: Diagnosis not present

## 2011-09-21 DIAGNOSIS — N3020 Other chronic cystitis without hematuria: Secondary | ICD-10-CM | POA: Diagnosis not present

## 2011-09-21 DIAGNOSIS — N3941 Urge incontinence: Secondary | ICD-10-CM | POA: Diagnosis not present

## 2011-09-21 DIAGNOSIS — Z8673 Personal history of transient ischemic attack (TIA), and cerebral infarction without residual deficits: Secondary | ICD-10-CM | POA: Diagnosis not present

## 2011-09-21 DIAGNOSIS — R350 Frequency of micturition: Secondary | ICD-10-CM | POA: Diagnosis not present

## 2011-09-21 DIAGNOSIS — N952 Postmenopausal atrophic vaginitis: Secondary | ICD-10-CM | POA: Diagnosis not present

## 2011-09-21 DIAGNOSIS — J984 Other disorders of lung: Secondary | ICD-10-CM

## 2011-09-21 DIAGNOSIS — I6350 Cerebral infarction due to unspecified occlusion or stenosis of unspecified cerebral artery: Secondary | ICD-10-CM

## 2011-09-21 DIAGNOSIS — F411 Generalized anxiety disorder: Secondary | ICD-10-CM

## 2011-09-21 HISTORY — PX: CYSTOSCOPY WITH INJECTION: SHX1424

## 2011-09-21 SURGERY — CYSTOSCOPY, WITH INJECTION OF BLADDER NECK OR BLADDER WALL
Anesthesia: General | Wound class: Clean Contaminated

## 2011-09-21 MED ORDER — ONDANSETRON HCL 4 MG/2ML IJ SOLN
INTRAMUSCULAR | Status: DC | PRN
  Administered 2011-09-21: 4 mg via INTRAVENOUS

## 2011-09-21 MED ORDER — HYDROCODONE-ACETAMINOPHEN 7.5-650 MG PO TABS: 1.0000 | ORAL_TABLET | Freq: Four times a day (QID) | ORAL | Status: AC | PRN

## 2011-09-21 MED ORDER — PROPOFOL 10 MG/ML IV BOLUS
INTRAVENOUS | Status: DC | PRN
  Administered 2011-09-21: 100 mg via INTRAVENOUS

## 2011-09-21 MED ORDER — ACETAMINOPHEN 10 MG/ML IV SOLN
INTRAVENOUS | Status: DC | PRN
  Administered 2011-09-21: 1000 mg via INTRAVENOUS

## 2011-09-21 MED ORDER — LACTATED RINGERS IV SOLN
INTRAVENOUS | Status: DC
  Administered 2011-09-21: 1000 mL via INTRAVENOUS

## 2011-09-21 MED ORDER — FENTANYL CITRATE 0.05 MG/ML IJ SOLN
INTRAMUSCULAR | Status: DC | PRN
Start: 2011-09-21 — End: 2011-09-21
  Administered 2011-09-21: 50 ug via INTRAVENOUS
  Administered 2011-09-21: 25 ug via INTRAVENOUS

## 2011-09-21 MED ORDER — ACETAMINOPHEN 10 MG/ML IV SOLN
INTRAVENOUS | Status: AC
  Filled 2011-09-21: qty 100

## 2011-09-21 MED ORDER — PHENAZOPYRIDINE HCL 200 MG PO TABS: 200.0000 mg | ORAL_TABLET | Freq: Three times a day (TID) | ORAL | Status: AC | PRN

## 2011-09-21 MED ORDER — LIDOCAINE HCL 1 % IJ SOLN
INTRAMUSCULAR | Status: DC | PRN
  Administered 2011-09-21: 50 mg via INTRADERMAL

## 2011-09-21 MED ORDER — CEFAZOLIN SODIUM 1-5 GM-% IV SOLN
INTRAVENOUS | Status: AC
  Filled 2011-09-21: qty 50

## 2011-09-21 MED ORDER — STERILE WATER FOR IRRIGATION IR SOLN
Status: DC | PRN
  Administered 2011-09-21: 3000 mL

## 2011-09-21 MED ORDER — GLYCOPYRROLATE 0.2 MG/ML IJ SOLN
INTRAMUSCULAR | Status: DC | PRN
  Administered 2011-09-21: 0.2 mg via INTRAVENOUS

## 2011-09-21 MED ORDER — CEFAZOLIN SODIUM 1-5 GM-% IV SOLN
1.0000 g | INTRAVENOUS | Status: AC
  Administered 2011-09-21: 1 g via INTRAVENOUS

## 2011-09-21 MED ORDER — FENTANYL CITRATE 0.05 MG/ML IJ SOLN: 25.0000 ug | INTRAMUSCULAR | Status: DC | PRN

## 2011-09-21 SURGICAL SUPPLY — 24 items
BAG URO CATCHER STRL LF (DRAPE) ×2 IMPLANT
CATH FOLEY 2WAY SLVR  5CC 14FR (CATHETERS) ×1
CATH FOLEY 2WAY SLVR 5CC 14FR (CATHETERS) IMPLANT
CLOTH BEACON ORANGE TIMEOUT ST (SAFETY) ×2 IMPLANT
DEFLUX NEEDLE (NEEDLE) IMPLANT
DEFLUX SYRINGE (SYRINGE) IMPLANT
DRAPE CAMERA CLOSED 9X96 (DRAPES) ×2 IMPLANT
GLOVE BIOGEL M STRL SZ7.5 (GLOVE) ×2 IMPLANT
GOWN STRL NON-REIN LRG LVL3 (GOWN DISPOSABLE) ×2 IMPLANT
GOWN STRL REIN XL XLG (GOWN DISPOSABLE) ×2 IMPLANT
INJECTION MACROPLASIQ 2.5ML UN (Female Continence) IMPLANT
MACROPLASTIQUE 2.5ML UNIT (Female Continence) ×4 IMPLANT
MANIFOLD NEPTUNE II (INSTRUMENTS) ×2 IMPLANT
NDL RIGID UROPLASTY (NEEDLE) IMPLANT
NDL SAFETY ECLIPSE 18X1.5 (NEEDLE) IMPLANT
NDL SPNL 22GX7 QUINCKE BK (NEEDLE) ×1 IMPLANT
NEEDLE HYPO 18GX1.5 SHARP (NEEDLE)
NEEDLE RIGID UROPLASTY (NEEDLE) ×2 IMPLANT
NEEDLE SPNL 22GX7 QUINCKE BK (NEEDLE) ×2 IMPLANT
PACK CYSTO (CUSTOM PROCEDURE TRAY) ×2 IMPLANT
SCRUB PCMX 4 OZ (MISCELLANEOUS) ×2 IMPLANT
SYR CONTROL 10ML LL (SYRINGE) IMPLANT
TUBING CONNECTING 10 (TUBING) ×1 IMPLANT
WATER STERILE IRR 3000ML UROMA (IV SOLUTION) ×2 IMPLANT

## 2011-09-21 NOTE — Interval H&P Note (Signed)
History and Physical Interval Note:  09/21/2011 8:54 AM  Gina Bowers  has presented today for surgery, with the diagnosis of Urge incontinence/Post menopausal Athrophic vaginitis  The various methods of treatment have been discussed with the patient and family. After consideration of risks, benefits and other options for treatment, the patient has consented to  Procedure(s) (LRB): CYSTOSCOPY WITH INJECTION (N/A) as a surgical intervention .  The patients' history has been reviewed, patient examined, no change in status, stable for surgery.  I have reviewed the patients' chart and labs.  Questions were answered to the patient's satisfaction.     Jethro Bolus I

## 2011-09-21 NOTE — Transfer of Care (Signed)
Immediate Anesthesia Transfer of Care Note  Patient: Gina Bowers  Procedure(s) Performed: Procedure(s) (LRB): CYSTOSCOPY WITH INJECTION (N/A)  Patient Location: PACU  Anesthesia Type: General  Level of Consciousness: awake, alert , oriented and patient cooperative  Airway & Oxygen Therapy: Patient Spontanous Breathing and Patient connected to face mask  Post-op Assessment: Report given to PACU RN, Post -op Vital signs reviewed and stable and Patient moving all extremities  Post vital signs: Reviewed and stable  Complications: No apparent anesthesia complications

## 2011-09-21 NOTE — Op Note (Signed)
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 Patsi Sears, MD  First assistant: None  Anesthesia:  General LMA}  Preparation: After appropriate preanesthesia, the arm bracelet was rechecked. The patient was brought to the operating room and placed on the operating table in the dorsal supine position where general LMA anesthesia was introduced. She was placed in the dorsal lithotomy position, where the pubis was prepped with Betadine solution and draped in usual fashion.  Review history: The patient is an 78 year old female with stress urinary incontinence, and intrinsic sphincter deficiency, as noted on preop urodynamics. She has had multiple attempts surgically at stress incontinence related, and is now for Macroplastique injection.  Statement of  Likelihood of Success: Excellent. TIME-OUT observed.:  Procedure: Cystourethroscopy was accomplished with the 30 lens, and shows normal-appearing urethra an open bladder neck, as well as normal bladder base. There was no evidence of bladder stone, tumor, or bladder diverticulum. There is clear reflux in both overseas.  Using the injection scope, 2 vials of Macroplastique or injected submucosally, approximately 1 cm from the bladder neck bilaterally. Excellent closure the bladder neck was noted. The bladder was drained with a 14 French catheter until empty. The patient had vaginal Estring placed, and she was then awakened, and taken to recovery room in good condition.

## 2011-09-21 NOTE — Anesthesia Postprocedure Evaluation (Signed)
  Anesthesia Post-op Note  Patient: Gina Bowers  Procedure(s) Performed: Procedure(s) (LRB): CYSTOSCOPY WITH INJECTION (N/A)  Patient Location: PACU  Anesthesia Type: General  Level of Consciousness: awake and alert   Airway and Oxygen Therapy: Patient Spontanous Breathing  Post-op Pain: mild  Post-op Assessment: Post-op Vital signs reviewed, Patient's Cardiovascular Status Stable, Respiratory Function Stable, Patent Airway and No signs of Nausea or vomiting  Post-op Vital Signs: stable  Complications: No apparent anesthesia complications

## 2011-09-21 NOTE — Anesthesia Preprocedure Evaluation (Addendum)
Anesthesia Evaluation  Patient identified by MRN, date of birth, ID band Patient awake    Reviewed: Allergy & Precautions, H&P , NPO status , Patient's Chart, lab work & pertinent test results  Airway Mallampati: II TM Distance: >3 FB Neck ROM: Full    Dental No notable dental hx. (+) Edentulous Upper and Edentulous Lower   Pulmonary shortness of breath, sleep apnea ,  CXR: hyperinflation without acute disease. breath sounds clear to auscultation  Pulmonary exam normal       Cardiovascular + CAD Rhythm:Regular Rate:Normal     Neuro/Psych PSYCHIATRIC DISORDERS Anxiety Depression CVA    GI/Hepatic Neg liver ROS, GERD-  Medicated,  Endo/Other  Hypothyroidism   Renal/GU negative Renal ROS  negative genitourinary   Musculoskeletal negative musculoskeletal ROS (+)   Abdominal   Peds negative pediatric ROS (+)  Hematology negative hematology ROS (+)   Anesthesia Other Findings   Reproductive/Obstetrics negative OB ROS                          Anesthesia Physical Anesthesia Plan  ASA: III  Anesthesia Plan: General   Post-op Pain Management:    Induction: Intravenous  Airway Management Planned: LMA  Additional Equipment:   Intra-op Plan:   Post-operative Plan: Extubation in OR  Informed Consent: I have reviewed the patients History and Physical, chart, labs and discussed the procedure including the risks, benefits and alternatives for the proposed anesthesia with the patient or authorized representative who has indicated his/her understanding and acceptance.   Dental advisory given  Plan Discussed with: CRNA  Anesthesia Plan Comments:        Anesthesia Quick Evaluation

## 2011-09-21 NOTE — H&P (Signed)
Active Problems Problems  1. Chronic Cystitis 595.2 2. Postmenopausal Atrophic Vaginitis 627.3 3. Renal Cyst 593.2 4. Urge Incontinence Of Urine 788.31 5. Urinary Frequency 788.41  History of Present Illness    Gina Bowers is an 78 yo female, with recurrent urinary incontinence and recurrent UTI.She is post CVA, and bladder tack in 1969 and 1990. She has mixed urge and stress incontinence, and leakage without sensation. She wears 2-3 extra thick Depends and Poise pads each day, which are soaked when she changes them.She has nocturia x 6. She has failed both Detrol 4mg  LA, and Sanctura 60mg . Her daughter notes that the meds may have helped at the beginning of the regimen, but then stopped working.    In 2009, she was reccommended for videourodynamics, but declined. Note her past hx of stone disease.    Urodynamics shows a max capacity of 637cc, with 1st sensation at 247cc, and strong desire at 648cc. The bladder is stable. Cough leak point pressure at 200cc is 86 cm H20, with moderate leakage. Valsalva LPP at 200cc is 74 cm H20, and at 300cc is 76cm H20, with severe leakage.   Pressure/flow shows a max flow of 22cc/sec, with a detrussor pressure of 8cm H20.   Cystocele was noted on fluroo, but may not be clinically significant. 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 She has stress incontinence, with low leak point pressure determination,  and moderate to severe incontinence.   Past Medical History Problems  1. History of  Anxiety (Symptom) 300.00 2. History of  Arthritis V13.4 3. History of  Depression 311 4. History of  Esophageal Reflux 530.81 5. History of  Heartburn 787.1 6. History of  Hypertension 401.9 7. History of  Hypothyroidism 244.9 8. History of  Ischemic Colitis 557.9 9. History of  Nephrolithiasis V13.01 10. History of  Stroke Syndrome 436 11. History of  Transient Ischemic Attack 435.9  Surgical History Problems  1. History of   Appendectomy 2. History of  Bladder Surgery 3. History of  Breast Surgery Lumpectomy Left 4. History of  CABG (CABG) V45.81 5. History of  Hernia Repair 6. History of  Hysterectomy V45.77 7. History of  Tonsillectomy  Current Meds 1. Aspirin 81 MG Oral Tablet; Therapy: (Recorded:05Aug2010) to 2. Crestor 20 MG Oral Tablet; Therapy: (Recorded:28Dec2012) to 3. MiraLax POWD; Therapy: (Recorded:05Aug2010) to 4. Multiple Vitamin TABS; Therapy: (Recorded:20Jan2011) to 5. Nitroglycerin 0.4 MG SUBL; Therapy: (Recorded:05Aug2010) to 6. Plavix 75 MG Oral Tablet; Therapy: (Recorded:06Jan2009) to 7. Protonix 40 MG Oral Tablet Delayed Release; Therapy: (Recorded:20Jan2011) to 8. Zoloft 50 MG Oral Tablet; Therapy: (Recorded:20Jan2011) to  Allergies Medication  1. Sulfa Drugs  Family History Problems  1. Family history of  Family Health Status Number Of Children 1 son, 1 daughter 2. Family history of  Father Deceased At Age ____ unknown 3. Family history of  Mother Deceased At Age ____ 48, heart attack  Social History Problems  1. Marital History - Widowed 2. Never A Smoker 3. Occupation: retired Denied  4. Alcohol Use 5. Caffeine Use 6. Tobacco Use  Review of Systems Genitourinary, constitutional, skin, eye, otolaryngeal, hematologic/lymphatic, cardiovascular, pulmonary, endocrine, musculoskeletal, gastrointestinal, neurological and psychiatric system(s) were reviewed and pertinent findings if present are noted.  Genitourinary: urinary frequency, urinary urgency, nocturia and incontinence, but no dysuria, no urinary hesitancy, urinary stream does not start and stop, no incomplete emptying of bladder, no hematuria, no urethral discharge, no foul smelling urine, no pelvic pain and initiating urination does not require straining.  Vitals Vital Signs [Data Includes: Last 1 Day]  04Apr2013 03:19PM  Blood Pressure: 128 / 74 Temperature: 97.3 F Heart Rate: 78  Physical  Exam Constitutional: Well nourished and well developed . No acute distress.  ENT:. The ears and nose are normal in appearance.  Neck: The appearance of the neck is normal and no neck mass is present.  Pulmonary: No respiratory distress and normal respiratory rhythm and effort.  Cardiovascular: Heart rate and rhythm are normal . No peripheral edema.  Abdomen: The abdomen is soft and nontender. No masses are palpated. No CVA tenderness. No hernias are palpable. No hepatosplenomegaly noted.  Genitourinary: Examination of the external genitalia shows vulvar atrophy, but normal female external genitalia and no lesions. The urethra is urethral caruncle, but normal in appearance and not tender. There is no urethral mass. Vaginal exam demonstrates atrophy and the vaginal epithelium to be poorly estrogenized, but no discharge, no tenderness and no uterine prolapse. No cystocele is identified. An enterocele is present. The cervix is is absent. The uterus is absent. The adnexa are palpably normal. The bladder is normal on palpation, non tender and not distended. The anus is normal on inspection. The perineum is normal on inspection.  Lymphatics: The femoral and inguinal nodes are not enlarged or tender.  Skin: Normal skin turgor, no visible rash and no visible skin lesions.    Results/Data Urine [Data Includes: Last 1 Day]   04Apr2013  COLOR AMBER   APPEARANCE CLEAR   SPECIFIC GRAVITY 1.010   pH 6.0   GLUCOSE NEG mg/dL  BILIRUBIN NEG   KETONE NEG mg/dL  BLOOD NEG   PROTEIN NEG mg/dL  UROBILINOGEN 0.2 mg/dL  NITRITE NEG   LEUKOCYTE ESTERASE NEG    Procedure  Procedure: Cystoscopy Amended By: Jethro Bolus; 09/17/2011 11:00 AMEST  Informed Consent: Risks, benefits, and potential adverse events were discussed and informed consent was obtained Amended By: Jethro Bolus; 09/17/2011 11:00 AMEST from the patient Amended By: Jethro Bolus; 09/17/2011 11:00 AMEST.  Prep: The patient was  prepped with betadine. Amended By: Jethro Bolus; 09/17/2011 11:00 AMEST.  Antibiotic prophylaxis: Ciprofloxacin Amended By: Jethro Bolus; 09/17/2011 11:00 AMEST.  Procedure Note:  Urethral meatus:. No abnormalities. Amended By: Jethro Bolus; 09/17/2011 11:00 AMEST.  Anterior urethra: No abnormalities Amended By: Jethro Bolus; 09/17/2011 11:00 AMEST.  Prostatic urethra:.  Bladder: Visulization was clear Amended By: Jethro Bolus; 09/17/2011 11:00 AMEST. The ureteral orifices were in the normal anatomic position bilaterally Amended By: Jethro Bolus; 09/17/2011 11:00 AMEST  and had clear efflux of urine Amended By: Jethro Bolus; 09/17/2011 11:00 AMEST. A systematic survey of the bladder demonstrated no bladder tumors or stones Amended By: Jethro Bolus; 09/17/2011 11:00 AMEST The mucosa was smooth without abnormalities Amended By: Jethro Bolus; 09/17/2011 11:00 AMEST The patient tolerated the procedure well Amended By: Jethro Bolus; 09/17/2011 11:00 AMEST  Complications: None. Amended By: Jethro Bolus; 09/17/2011 11:00 AMEST.    Assessment Assessed  1. Chronic Cystitis 595.2 2. Postmenopausal Atrophic Vaginitis 627.3 3. Urge Incontinence Of Urine 788.31 4. Urinary Frequency 788.41   78 yo female with 2 prior urethropexies and hysterectomy and failure of anticholinergic therapy. She has caruncle on exam. I do not appreciate a cystocele on physical exam, and note a very small vaginal entroitus with severe vaginal atrophy. She needs vaginal hormone therapy and macroplastique injection. Note I am concerned with her low detrussor voiding pressure, and her lack of spasms. She does not need anticholinergics. I am concerned that repeat surgery would cause obstruction.  Plan Postmenopausal Atrophic Vaginitis (627.3), Urge Incontinence Of Urine (788.31)  1. Estring 2 MG Vaginal Ring; CHANGE EVERY 3 MONTHS; Therapy: 04Apr2013 to   (Evaluate:30Mar2014)  Requested for: 04Apr2013; Last Rx:04Apr2013; Edited    Discussion/Summary     Signatures Cystoscopy and Macroplastique injection for combination of ISD and SUI, failing prior urethropexies and antichoinergics. She has atrophic vaginitis, and will have Estring placed also.

## 2011-09-22 ENCOUNTER — Encounter (HOSPITAL_COMMUNITY): Payer: Self-pay | Admitting: Urology

## 2011-10-02 DIAGNOSIS — R350 Frequency of micturition: Secondary | ICD-10-CM | POA: Diagnosis not present

## 2011-10-02 DIAGNOSIS — N3941 Urge incontinence: Secondary | ICD-10-CM | POA: Diagnosis not present

## 2011-10-02 DIAGNOSIS — N3020 Other chronic cystitis without hematuria: Secondary | ICD-10-CM | POA: Diagnosis not present

## 2011-10-05 DIAGNOSIS — M25559 Pain in unspecified hip: Secondary | ICD-10-CM | POA: Diagnosis not present

## 2011-10-16 DIAGNOSIS — T148XXA Other injury of unspecified body region, initial encounter: Secondary | ICD-10-CM | POA: Diagnosis not present

## 2011-10-23 DIAGNOSIS — T148XXA Other injury of unspecified body region, initial encounter: Secondary | ICD-10-CM | POA: Diagnosis not present

## 2011-10-29 DIAGNOSIS — Z951 Presence of aortocoronary bypass graft: Secondary | ICD-10-CM | POA: Diagnosis not present

## 2011-10-29 DIAGNOSIS — R6889 Other general symptoms and signs: Secondary | ICD-10-CM | POA: Diagnosis not present

## 2011-10-29 DIAGNOSIS — I2510 Atherosclerotic heart disease of native coronary artery without angina pectoris: Secondary | ICD-10-CM | POA: Diagnosis not present

## 2011-10-29 DIAGNOSIS — Z79899 Other long term (current) drug therapy: Secondary | ICD-10-CM | POA: Diagnosis not present

## 2011-10-29 DIAGNOSIS — M109 Gout, unspecified: Secondary | ICD-10-CM | POA: Diagnosis not present

## 2011-10-29 DIAGNOSIS — E782 Mixed hyperlipidemia: Secondary | ICD-10-CM | POA: Diagnosis not present

## 2011-10-29 DIAGNOSIS — R5381 Other malaise: Secondary | ICD-10-CM | POA: Diagnosis not present

## 2011-11-05 DIAGNOSIS — E782 Mixed hyperlipidemia: Secondary | ICD-10-CM | POA: Diagnosis not present

## 2011-11-05 DIAGNOSIS — I2510 Atherosclerotic heart disease of native coronary artery without angina pectoris: Secondary | ICD-10-CM | POA: Diagnosis not present

## 2011-11-05 DIAGNOSIS — Z951 Presence of aortocoronary bypass graft: Secondary | ICD-10-CM | POA: Diagnosis not present

## 2011-11-05 DIAGNOSIS — I10 Essential (primary) hypertension: Secondary | ICD-10-CM | POA: Diagnosis not present

## 2011-11-12 ENCOUNTER — Encounter (HOSPITAL_COMMUNITY): Payer: Self-pay | Admitting: Emergency Medicine

## 2011-11-12 ENCOUNTER — Inpatient Hospital Stay (HOSPITAL_COMMUNITY)
Admission: EM | Admit: 2011-11-12 | Discharge: 2011-11-17 | DRG: 065 | Disposition: A | Discharge status: Rehabilitation Facility | Payer: Medicare Other | Attending: Internal Medicine | Admitting: Internal Medicine

## 2011-11-12 ENCOUNTER — Emergency Department (HOSPITAL_COMMUNITY): Payer: Medicare Other

## 2011-11-12 DIAGNOSIS — Z8673 Personal history of transient ischemic attack (TIA), and cerebral infarction without residual deficits: Secondary | ICD-10-CM

## 2011-11-12 DIAGNOSIS — J42 Unspecified chronic bronchitis: Secondary | ICD-10-CM | POA: Diagnosis present

## 2011-11-12 DIAGNOSIS — M129 Arthropathy, unspecified: Secondary | ICD-10-CM | POA: Diagnosis present

## 2011-11-12 DIAGNOSIS — Z96649 Presence of unspecified artificial hip joint: Secondary | ICD-10-CM

## 2011-11-12 DIAGNOSIS — J984 Other disorders of lung: Secondary | ICD-10-CM

## 2011-11-12 DIAGNOSIS — G4733 Obstructive sleep apnea (adult) (pediatric): Secondary | ICD-10-CM

## 2011-11-12 DIAGNOSIS — G459 Transient cerebral ischemic attack, unspecified: Secondary | ICD-10-CM

## 2011-11-12 DIAGNOSIS — F411 Generalized anxiety disorder: Secondary | ICD-10-CM

## 2011-11-12 DIAGNOSIS — E785 Hyperlipidemia, unspecified: Secondary | ICD-10-CM | POA: Diagnosis present

## 2011-11-12 DIAGNOSIS — Z9861 Coronary angioplasty status: Secondary | ICD-10-CM

## 2011-11-12 DIAGNOSIS — G8190 Hemiplegia, unspecified affecting unspecified side: Secondary | ICD-10-CM | POA: Diagnosis present

## 2011-11-12 DIAGNOSIS — R0902 Hypoxemia: Secondary | ICD-10-CM

## 2011-11-12 DIAGNOSIS — R5381 Other malaise: Secondary | ICD-10-CM | POA: Diagnosis not present

## 2011-11-12 DIAGNOSIS — Z79899 Other long term (current) drug therapy: Secondary | ICD-10-CM

## 2011-11-12 DIAGNOSIS — I2510 Atherosclerotic heart disease of native coronary artery without angina pectoris: Secondary | ICD-10-CM | POA: Diagnosis present

## 2011-11-12 DIAGNOSIS — R5383 Other fatigue: Secondary | ICD-10-CM | POA: Diagnosis not present

## 2011-11-12 DIAGNOSIS — I6350 Cerebral infarction due to unspecified occlusion or stenosis of unspecified cerebral artery: Principal | ICD-10-CM | POA: Diagnosis present

## 2011-11-12 DIAGNOSIS — Z7982 Long term (current) use of aspirin: Secondary | ICD-10-CM

## 2011-11-12 DIAGNOSIS — G319 Degenerative disease of nervous system, unspecified: Secondary | ICD-10-CM | POA: Diagnosis not present

## 2011-11-12 DIAGNOSIS — K219 Gastro-esophageal reflux disease without esophagitis: Secondary | ICD-10-CM | POA: Diagnosis present

## 2011-11-12 DIAGNOSIS — Z951 Presence of aortocoronary bypass graft: Secondary | ICD-10-CM

## 2011-11-12 DIAGNOSIS — F329 Major depressive disorder, single episode, unspecified: Secondary | ICD-10-CM | POA: Diagnosis present

## 2011-11-12 DIAGNOSIS — E039 Hypothyroidism, unspecified: Secondary | ICD-10-CM | POA: Diagnosis present

## 2011-11-12 DIAGNOSIS — Z7902 Long term (current) use of antithrombotics/antiplatelets: Secondary | ICD-10-CM

## 2011-11-12 DIAGNOSIS — R32 Unspecified urinary incontinence: Secondary | ICD-10-CM | POA: Diagnosis present

## 2011-11-12 DIAGNOSIS — J8410 Pulmonary fibrosis, unspecified: Secondary | ICD-10-CM | POA: Diagnosis present

## 2011-11-12 DIAGNOSIS — R29810 Facial weakness: Secondary | ICD-10-CM | POA: Diagnosis present

## 2011-11-12 DIAGNOSIS — R531 Weakness: Secondary | ICD-10-CM

## 2011-11-12 DIAGNOSIS — F3289 Other specified depressive episodes: Secondary | ICD-10-CM | POA: Diagnosis present

## 2011-11-12 LAB — DIFFERENTIAL
Basophils Absolute: 0 10*3/uL (ref 0.0–0.1)
Eosinophils Relative: 3 % (ref 0–5)
Lymphocytes Relative: 36 % (ref 12–46)
Monocytes Absolute: 0.8 10*3/uL (ref 0.1–1.0)
Monocytes Relative: 9 % (ref 3–12)

## 2011-11-12 LAB — CBC
HCT: 33.9 % — ABNORMAL LOW (ref 36.0–46.0)
Hemoglobin: 11.3 g/dL — ABNORMAL LOW (ref 12.0–15.0)
MCV: 92.1 fL (ref 78.0–100.0)
RDW: 13.2 % (ref 11.5–15.5)
WBC: 9.2 10*3/uL (ref 4.0–10.5)

## 2011-11-12 LAB — BASIC METABOLIC PANEL
BUN: 17 mg/dL (ref 6–23)
CO2: 25 mEq/L (ref 19–32)
Calcium: 9.3 mg/dL (ref 8.4–10.5)
Creatinine, Ser: 0.57 mg/dL (ref 0.50–1.10)
Glucose, Bld: 99 mg/dL (ref 70–99)

## 2011-11-12 NOTE — ED Notes (Signed)
CARE GIVER NOTICED THAT PT.'S LEFT SIDE IS WEAK AT AROUND 5 PM THIS AFTERNOON , AT ARRIVAL PT. IS ALERT AND ORIENTED , SPEECH IS CLEAR , EQUAL STRONG GRIPS , NO ARM DRIFT , NO FACIAL ASYMMETRY . DENIES PAIN , RESPIRATIONS UNLABORED.

## 2011-11-12 NOTE — ED Notes (Addendum)
Pt. Reports numbness and weakness in left arm and left leg that started at approx 5pm today. Difficulty walking.  States "I came all of a sudden, but it's getting better". Was unable to drink water. "I coughed, like I was gagging". Care giver reports changes to the pt's face: "she doesn't normally look like that. Denies pain. Hxt of stroke and TIA. A.O. X4. NAD.

## 2011-11-13 ENCOUNTER — Inpatient Hospital Stay (HOSPITAL_COMMUNITY): Payer: Medicare Other

## 2011-11-13 ENCOUNTER — Encounter (HOSPITAL_COMMUNITY): Payer: Self-pay | Admitting: Internal Medicine

## 2011-11-13 DIAGNOSIS — R32 Unspecified urinary incontinence: Secondary | ICD-10-CM | POA: Diagnosis present

## 2011-11-13 DIAGNOSIS — R531 Weakness: Secondary | ICD-10-CM

## 2011-11-13 DIAGNOSIS — E785 Hyperlipidemia, unspecified: Secondary | ICD-10-CM | POA: Diagnosis not present

## 2011-11-13 DIAGNOSIS — Z9861 Coronary angioplasty status: Secondary | ICD-10-CM | POA: Diagnosis not present

## 2011-11-13 DIAGNOSIS — Z7982 Long term (current) use of aspirin: Secondary | ICD-10-CM | POA: Diagnosis not present

## 2011-11-13 DIAGNOSIS — R0902 Hypoxemia: Secondary | ICD-10-CM | POA: Diagnosis not present

## 2011-11-13 DIAGNOSIS — E039 Hypothyroidism, unspecified: Secondary | ICD-10-CM | POA: Diagnosis not present

## 2011-11-13 DIAGNOSIS — E032 Hypothyroidism due to medicaments and other exogenous substances: Secondary | ICD-10-CM

## 2011-11-13 DIAGNOSIS — I6340 Cerebral infarction due to embolism of unspecified cerebral artery: Secondary | ICD-10-CM

## 2011-11-13 DIAGNOSIS — J42 Unspecified chronic bronchitis: Secondary | ICD-10-CM | POA: Diagnosis present

## 2011-11-13 DIAGNOSIS — G8190 Hemiplegia, unspecified affecting unspecified side: Secondary | ICD-10-CM | POA: Diagnosis not present

## 2011-11-13 DIAGNOSIS — I6330 Cerebral infarction due to thrombosis of unspecified cerebral artery: Secondary | ICD-10-CM | POA: Diagnosis not present

## 2011-11-13 DIAGNOSIS — I2510 Atherosclerotic heart disease of native coronary artery without angina pectoris: Secondary | ICD-10-CM | POA: Diagnosis present

## 2011-11-13 DIAGNOSIS — F329 Major depressive disorder, single episode, unspecified: Secondary | ICD-10-CM | POA: Diagnosis present

## 2011-11-13 DIAGNOSIS — J8410 Pulmonary fibrosis, unspecified: Secondary | ICD-10-CM | POA: Diagnosis present

## 2011-11-13 DIAGNOSIS — M6281 Muscle weakness (generalized): Secondary | ICD-10-CM | POA: Diagnosis not present

## 2011-11-13 DIAGNOSIS — I6350 Cerebral infarction due to unspecified occlusion or stenosis of unspecified cerebral artery: Secondary | ICD-10-CM | POA: Diagnosis not present

## 2011-11-13 DIAGNOSIS — I10 Essential (primary) hypertension: Secondary | ICD-10-CM | POA: Diagnosis not present

## 2011-11-13 DIAGNOSIS — Z8673 Personal history of transient ischemic attack (TIA), and cerebral infarction without residual deficits: Secondary | ICD-10-CM | POA: Diagnosis not present

## 2011-11-13 DIAGNOSIS — R5383 Other fatigue: Secondary | ICD-10-CM | POA: Diagnosis not present

## 2011-11-13 DIAGNOSIS — G459 Transient cerebral ischemic attack, unspecified: Secondary | ICD-10-CM

## 2011-11-13 DIAGNOSIS — G319 Degenerative disease of nervous system, unspecified: Secondary | ICD-10-CM | POA: Diagnosis not present

## 2011-11-13 DIAGNOSIS — Z79899 Other long term (current) drug therapy: Secondary | ICD-10-CM | POA: Diagnosis not present

## 2011-11-13 DIAGNOSIS — Z96649 Presence of unspecified artificial hip joint: Secondary | ICD-10-CM | POA: Diagnosis not present

## 2011-11-13 DIAGNOSIS — R0602 Shortness of breath: Secondary | ICD-10-CM | POA: Diagnosis not present

## 2011-11-13 DIAGNOSIS — Z7902 Long term (current) use of antithrombotics/antiplatelets: Secondary | ICD-10-CM | POA: Diagnosis not present

## 2011-11-13 DIAGNOSIS — M129 Arthropathy, unspecified: Secondary | ICD-10-CM | POA: Diagnosis present

## 2011-11-13 DIAGNOSIS — K219 Gastro-esophageal reflux disease without esophagitis: Secondary | ICD-10-CM | POA: Diagnosis present

## 2011-11-13 DIAGNOSIS — Z5189 Encounter for other specified aftercare: Secondary | ICD-10-CM | POA: Diagnosis not present

## 2011-11-13 DIAGNOSIS — R29810 Facial weakness: Secondary | ICD-10-CM | POA: Diagnosis present

## 2011-11-13 DIAGNOSIS — Z951 Presence of aortocoronary bypass graft: Secondary | ICD-10-CM | POA: Diagnosis not present

## 2011-11-13 DIAGNOSIS — G8110 Spastic hemiplegia affecting unspecified side: Secondary | ICD-10-CM | POA: Diagnosis not present

## 2011-11-13 DIAGNOSIS — R29898 Other symptoms and signs involving the musculoskeletal system: Secondary | ICD-10-CM | POA: Diagnosis not present

## 2011-11-13 DIAGNOSIS — I6789 Other cerebrovascular disease: Secondary | ICD-10-CM | POA: Diagnosis not present

## 2011-11-13 LAB — CBC
HCT: 33.5 % — ABNORMAL LOW (ref 36.0–46.0)
MCH: 30.9 pg (ref 26.0–34.0)
MCV: 92.3 fL (ref 78.0–100.0)
Platelets: 185 10*3/uL (ref 150–400)
RDW: 12.9 % (ref 11.5–15.5)

## 2011-11-13 LAB — CREATININE, SERUM: GFR calc Af Amer: >90 mL/min (ref >90)

## 2011-11-13 LAB — URINALYSIS, ROUTINE W REFLEX MICROSCOPIC
Bilirubin Urine: NEGATIVE
Glucose, UA: NEGATIVE mg/dL
Hgb urine dipstick: NEGATIVE
Ketones, ur: NEGATIVE mg/dL
Specific Gravity, Urine: 1.007 (ref 1.005–1.030)
pH: 6 (ref 5.0–8.0)

## 2011-11-13 MED ORDER — ONDANSETRON HCL 4 MG/2ML IJ SOLN
4.0000 mg | Freq: Four times a day (QID) | INTRAMUSCULAR | Status: DC | PRN
Start: 2011-11-13 — End: 2011-11-17

## 2011-11-13 MED ORDER — LEVOTHYROXINE SODIUM 50 MCG PO TABS
50.0000 ug | ORAL_TABLET | Freq: Every day | ORAL | Status: DC
  Administered 2011-11-13 – 2011-11-17 (×5): 50 ug via ORAL
  Filled 2011-11-13 (×6): qty 1

## 2011-11-13 MED ORDER — ADULT MULTIVITAMIN W/MINERALS CH
1.0000 | ORAL_TABLET | Freq: Every day | ORAL | Status: DC
  Administered 2011-11-13 – 2011-11-17 (×5): 1 via ORAL
  Filled 2011-11-13 (×6): qty 1

## 2011-11-13 MED ORDER — NITROFURANTOIN MACROCRYSTAL 100 MG PO CAPS
100.0000 mg | ORAL_CAPSULE | Freq: Every day | ORAL | Status: DC
  Administered 2011-11-13 – 2011-11-17 (×5): 100 mg via ORAL
  Filled 2011-11-13 (×6): qty 1

## 2011-11-13 MED ORDER — SERTRALINE HCL 50 MG PO TABS
75.0000 mg | ORAL_TABLET | Freq: Every evening | ORAL | Status: DC
  Administered 2011-11-13 – 2011-11-16 (×4): 75 mg via ORAL
  Filled 2011-11-13 (×5): qty 1

## 2011-11-13 MED ORDER — ASPIRIN EC 81 MG PO TBEC
81.0000 mg | DELAYED_RELEASE_TABLET | Freq: Every day | ORAL | Status: DC
  Administered 2011-11-13 – 2011-11-14 (×2): 81 mg via ORAL
  Filled 2011-11-13 (×3): qty 1

## 2011-11-13 MED ORDER — PANTOPRAZOLE SODIUM 40 MG PO TBEC
40.0000 mg | DELAYED_RELEASE_TABLET | Freq: Every day | ORAL | Status: DC
  Administered 2011-11-13 – 2011-11-17 (×5): 40 mg via ORAL
  Filled 2011-11-13 (×5): qty 1

## 2011-11-13 MED ORDER — SENNOSIDES-DOCUSATE SODIUM 8.6-50 MG PO TABS: 1.0000 | ORAL_TABLET | Freq: Every evening | ORAL | Status: DC | PRN

## 2011-11-13 MED ORDER — ATORVASTATIN CALCIUM 10 MG PO TABS
10.0000 mg | ORAL_TABLET | Freq: Every day | ORAL | Status: DC
  Administered 2011-11-13 – 2011-11-15 (×3): 10 mg via ORAL
  Filled 2011-11-13 (×4): qty 1

## 2011-11-13 MED ORDER — DARIFENACIN HYDROBROMIDE ER 7.5 MG PO TB24
7.5000 mg | ORAL_TABLET | Freq: Every day | ORAL | Status: DC
  Administered 2011-11-13 – 2011-11-17 (×5): 7.5 mg via ORAL
  Filled 2011-11-13 (×5): qty 1

## 2011-11-13 MED ORDER — ENOXAPARIN SODIUM 30 MG/0.3ML ~~LOC~~ SOLN
30.0000 mg | SUBCUTANEOUS | Status: DC
  Administered 2011-11-13: 30 mg via SUBCUTANEOUS
  Filled 2011-11-13 (×2): qty 0.3

## 2011-11-13 MED ORDER — CLOPIDOGREL BISULFATE 75 MG PO TABS
75.0000 mg | ORAL_TABLET | Freq: Every day | ORAL | Status: DC
  Administered 2011-11-13 – 2011-11-14 (×2): 75 mg via ORAL
  Filled 2011-11-13 (×3): qty 1

## 2011-11-13 NOTE — Procedures (Signed)
REFERRING PHYSICIAN:  Dr. Mikeal Hawthorne.  HISTORY:  An 78 year old female with multiple strokes.  MEDICATION:  Aspirin, Plavix, Enablex, Lovenox, Protonix.  CONDITIONS OF RECORDING:  This is a 16-channel EEG carried out with the patient in the asleep state.  DESCRIPTION:  The background activity is slow and poorly organized.  It is dominated by polymorphic delta activity.  There are superimposed symmetrical sleep spindles and vertex central sharp transients.  This activity was persistent throughout the tracing.  Hyperventilation was not performed.  Intermittent photic stimulation failed to elicit any change in the tracing.  IMPRESSION:  This is a normal stage 2 sleep EEG.  No epileptiform activity was noted.          ______________________________ Thana Farr, MD    AV:WUJW D:  11/13/2011 17:33:11  T:  11/13/2011 18:45:19  Job #:  [id number]

## 2011-11-13 NOTE — Evaluation (Signed)
Physical Therapy Evaluation Patient Details Name: Gina Bowers MRN: [id number] DOB: 10-13-1925 Today's Date: 11/13/2011 Time: 3086-5784 PT Time Calculation (min): 27 min  PT Assessment / Plan / Recommendation Clinical Impression  Pt presents with a medical diagnosis of probable TIA/CVA, MRI results pending. Pt with left sided weakness and facial droop as well as slight dysarthria. Pt will benefit from skilled PT in the acute care setting in order to maximize functional mobility and safety prior to d/c home with 24 hr assist from family    PT Assessment  Patient needs continued PT services    Follow Up Recommendations  Home health PT;Supervision/Assistance - 24 hour    Barriers to Discharge Decreased caregiver support family will arrange 24/7 as needed    lEquipment Recommendations  None recommended by OT;None recommended by PT    Recommendations for Other Services     Frequency Min 4X/week    Precautions / Restrictions Precautions Precautions: Fall Restrictions Weight Bearing Restrictions: No         Mobility  Bed Mobility Bed Mobility: Supine to Sit;Sit to Supine;Sitting - Scoot to Edge of Bed Supine to Sit: 4: Min assist;With rails;HOB flat Sitting - Scoot to Edge of Bed: 5: Supervision Sit to Supine: 5: Supervision;HOB flat Details for Bed Mobility Assistance: Assist to support trunk OOB. Supine <>sit required increased time. Transfers Transfers: Sit to Stand;Stand to Sit Sit to Stand: 4: Min assist;From bed;With upper extremity assist Stand to Sit: 4: Min guard;To bed Details for Transfer Assistance: Verbal cues to lock brakes before performing sit<>stand.   Pt descended to bed while hands still placed on rollator. Increased cueing for safety of locking/unlocking rollator prior to sit/stand Ambulation/Gait Ambulation/Gait Assistance: 4: Min assist Ambulation Distance (Feet): 100 Feet Assistive device: Rollator Ambulation/Gait Assistance Details: VC for upright  posture throughout. With increased fatigue, pt with decreased L step length and increased foot drag. Gait Pattern: Decreased step length - left;Decreased hip/knee flexion - left;Decreased dorsiflexion - left;Trunk flexed;Wide base of support Gait velocity: decreased gait speed Modified Rankin (Stroke Patients Only) Pre-Morbid Rankin Score: No significant disability Modified Rankin: Moderately severe disability    Exercises General Exercises - Lower Extremity Ankle Circles/Pumps: AROM;Strengthening;Left;10 reps;Supine Heel Slides: Left;10 reps;Strengthening;Supine;AROM Hip ABduction/ADduction: Left;10 reps;Supine;AROM;Strengthening Straight Leg Raises: Left;10 reps;Supine;Strengthening;AROM   PT Diagnosis: Difficulty walking  PT Problem List: Decreased strength;Decreased activity tolerance;Decreased balance;Decreased mobility;Decreased knowledge of use of DME;Decreased safety awareness;Decreased knowledge of precautions PT Treatment Interventions: DME instruction;Gait training;Functional mobility training;Therapeutic activities;Therapeutic exercise;Balance training;Neuromuscular re-education;Patient/family education   PT Goals Acute Rehab PT Goals PT Goal Formulation: With patient/family Time For Goal Achievement: 11/20/11 Potential to Achieve Goals: Good Pt will go Supine/Side to Sit: with modified independence PT Goal: Supine/Side to Sit - Progress: Goal set today Pt will go Sit to Supine/Side: with modified independence PT Goal: Sit to Supine/Side - Progress: Goal set today Pt will go Sit to Stand: with supervision PT Goal: Sit to Stand - Progress: Goal set today Pt will go Stand to Sit: with supervision PT Goal: Stand to Sit - Progress: Goal set today Pt will Transfer Bed to Chair/Chair to Bed: with supervision PT Transfer Goal: Bed to Chair/Chair to Bed - Progress: Goal set today Pt will Ambulate: >150 feet;with supervision;with least restrictive assistive device PT Goal:  Ambulate - Progress: Goal set today Pt will Perform Home Exercise Program: Independently PT Goal: Perform Home Exercise Program - Progress: Goal set today  Visit Information  Last PT Received On: 11/13/11 Assistance Needed: +1 PT/OT Co-Evaluation/Treatment:  Yes    Subjective Data      Prior Functioning  Home Living Lives With: Alone Available Help at Discharge: Personal care attendant (1 hour a day) Type of Home: Apartment Home Access: Level entry;Elevator Home Layout: One level Bathroom Shower/Tub: Walk-in shower;Door [employer] Toilet: Handicapped height Bathroom Accessibility: Yes How Accessible: Accessible via walker Home Adaptive Equipment: Other (comment);Wheelchair - manual;Grab bars around toilet;Grab bars in shower;Bedside commode/3-in-1;Shower chair with back;Reacher (rollator, lift chair) Prior Function Level of Independence: Independent with assistive device(s) [facility] does all cleaning, meal prep, etc) Able to Take Stairs?: No Driving: No 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 for tasks performed    Extremity/Trunk Assessment Right Upper Extremity Assessment RUE ROM/Strength/Tone: WFL for tasks assessed (3+/5 throughout) RUE Sensation: WFL - Light Touch;WFL - Proprioception RUE Coordination: WFL - gross/fine motor Left Upper Extremity Assessment LUE ROM/Strength/Tone: Deficits LUE ROM/Strength/Tone Deficits: 3/5 throughout.   LUE Sensation: Deficits LUE Sensation Deficits: Unaware of Left hand placement during functinoal tasks.  Reports feeling some numbness in LUE but able to detect light touch. LUE Coordination: Deficits LUE Coordination Deficits: Decreased  FMC: requring increased time and effort Right Lower Extremity Assessment RLE ROM/Strength/Tone: Within functional levels RLE Sensation: WFL - Light Touch Left Lower Extremity Assessment LLE ROM/Strength/Tone: Deficits LLE ROM/Strength/Tone Deficits: MMT 3+-4/5 LLE Sensation: WFL - Light Touch   Balance    End of Session PT - End of Session Equipment Utilized During Treatment: Gait belt Activity Tolerance: Patient tolerated treatment well Patient left: in bed;with call bell/phone within reach;with family/visitor present Nurse Communication: Mobility status   Milana Kidney 11/13/2011, 4:35 PM  11/13/2011 Milana Kidney DPT PAGER: [phone] OFFICE: [phone]

## 2011-11-13 NOTE — Consult Note (Signed)
[facility] CONSULT NOTE     Reason for Consult: stroke    HPI:    Gina Bowers is an 78 y.o. female with history of stroke and seen by Dr. Pearlean Brownie in out patient setting who was brought in by caregiver due to onset of left-sided weakness. Symptoms started around 5 PM on June 20 when she was eating.  Per family member, she actually stood up and used walker to get back to bed (left arm kept falling off walker).  When her caregiver came in to check on her she noted left facial droop and left sided weakness. Per notes, the majority of symptoms have resolved by the time she arrived at the emergency room other than some residual facial asymmetry. She's had a previous stroke that she recovered from fully and had been placed on aspirin and Plavix. Initial head CT without contrast showed no acute CVA.   Past Medical History  Diagnosis Date  . Coronary artery disease     DR. Alanda Amass IS PT'S CARDIOLOGIST - HX CABG AND HAS STENT AND TOLD SHE HAS ANOTHER BLOCKAGE-BEING TREATED MEDICALLY  . Stroke     SEVERAL STROKES -TOLD SHE HAS CEREBELLUM BLOCKAGE AS RESULT OF STROKE--BUT NEUROLOGIST SAID HE DID NOT WANT TO DO ANY PROCEDURE BECAUSE OF HER AGE .  PT HAS SLIGHT LEFT FOOT DROP-DRAGS FOOT WHEN WALKING - USES WALKER  . High cholesterol   . Lung nodules     TOLD SHE HAS INTERSTITUAL LUNG DISEASE  . Shortness of breath     AND WHEEZING AT TIMES--NO INHALERS  . Vertigo   . Arthritis   . GERD (gastroesophageal reflux disease)   . Urinary incontinence   . UTI (lower urinary tract infection)     HX OF UTI'S-- LAST TIME COUPLE OF MONTHS AGO  . Hypothyroidism   . Blood transfusion 2007    AFTER HIP REPLACEMENT  . Fracture FEB 2013    FRACTURED LEFT ANKLE--NO SURGERY--WAS IN BOOT UNTIL COUPLE WEEKS AGO  . Depression   . Kidney stones     IN THE PAST  . Carotid bruit     PT'S DAUGHTER STATES PT HAD RECENT CAROTID STUDY AND WAS TOLD NO SIGNIFICANT BLOCKAGES  . [place]  virus     COUPLE OF WEEKS AGO--ALL SYMTOMS RESOLVED PER PT    Past Surgical History  Procedure Date  . Coronary angioplasty 10/2010    WITH STENT PLACEMENT  . Coronary artery bypass graft 2009  . Joint replacement 2007    HIP REPLACEMENT -LEFT  . Bladder tack     1969 AND 1990  . Cystoscopy with injection 09/21/2011    Procedure: CYSTOSCOPY WITH INJECTION;  Surgeon: Kathi Ludwig, MD;  Location: [facility];  Service: Urology;  Laterality: N/A;  Peri Urethral Macroplastique Injection and Estring Placement    History reviewed. No pertinent family history.  Social History:  reports that she has never smoked. She has never used smokeless tobacco. She reports that she does not drink alcohol or use illicit drugs.  Allergies  Allergen Reactions  . Sulfonamide Derivatives Hives    Medications:    Prior to Admission:  Prescriptions prior to admission  Medication Sig Dispense Refill  . aspirin EC 81 MG tablet Take 81 mg by mouth daily with breakfast.      . clopidogrel (PLAVIX) 75 MG tablet Take 75 mg  by mouth daily with breakfast.      . fish oil-omega-3 fatty acids 1000 MG capsule Take 1 g by mouth daily with breakfast.      . levothyroxine (SYNTHROID, LEVOTHROID) 50 MCG tablet Take 50 mcg by mouth daily with breakfast.      . Multiple Vitamin (MULITIVITAMIN WITH MINERALS) TABS Take 1 tablet by mouth daily with breakfast.      . nitrofurantoin (MACRODANTIN) 100 MG capsule Take 100 mg by mouth daily with breakfast.      . OVER THE COUNTER MEDICATION IMMUNE BOOSTER -OTC      . pantoprazole (PROTONIX) 40 MG tablet Take 40 mg by mouth daily with breakfast.      . rosuvastatin (CRESTOR) 20 MG tablet Take 20 mg by mouth every evening.       . sertraline (ZOLOFT) 50 MG tablet Take 75 mg by mouth every evening. Patient takes 1 and 1/2 tablets by mouth every day      . trospium (SANCTURA) 20 MG tablet Take 60 mg by mouth daily.       Scheduled:   . aspirin EC  81 mg Oral Q breakfast  .  atorvastatin  10 mg Oral q1800  . clopidogrel  75 mg Oral Q breakfast  . darifenacin  7.5 mg Oral Daily  . enoxaparin  30 mg Subcutaneous Q24H  . levothyroxine  50 mcg Oral Q breakfast  . multivitamin with minerals  1 tablet Oral Q breakfast  . nitrofurantoin  100 mg Oral Q breakfast  . pantoprazole  40 mg Oral Q breakfast  . sertraline  75 mg Oral QPM    Review of Systems - General ROS: negative for - chills, fatigue, fever or hot flashes Hematological and Lymphatic ROS: negative for - bruising, fatigue, jaundice or pallor Endocrine ROS: negative for - hair pattern changes, hot flashes, mood swings or skin changes Respiratory ROS: negative for - cough, hemoptysis, orthopnea or wheezing Cardiovascular ROS: negative for - dyspnea on exertion, orthopnea, palpitations or shortness of breath Gastrointestinal ROS: negative for - abdominal pain, appetite loss, blood in stools, diarrhea or hematemesis Musculoskeletal ROS: negative for - joint pain, joint stiffness, joint swelling or muscle pain Neurological ROS: positive for - gait disturbance and weakness Dermatological ROS: negative for dry skin, pruritus and rash   Blood pressure 130/70, pulse 60, temperature 98 F (36.7 C), temperature source Oral, resp. rate 18, weight 74 kg (163 lb 2.3 oz), SpO2 98.00%.   Neurologic Examination:   Mental Status: Alert, oriented, thought content appropriate.  Speech fluent without evidence of aphasia. Able to follow 3 step commands without difficulty. Cranial Nerves: II-Visual fields grossly intact. III/IV/VI-Extraocular movements intact.  Pupils reactive bilaterally. V/VII-Smile asymmetric with left facial droop VIII-grossly intact IX/X-normal gag XI-bilateral shoulder shrug XII-midline tongue extension Motor: 4/5 Right upper and lower extremity, left proximal weakness with drift in UE and left proximal weakness with hip flexion 4/5.  Distal left UE and LE stronger than proximal.  Sensory:  Pinprick and light touch intact throughout, bilaterally Deep Tendon Reflexes: 2+ brisk throughout with right DTR slightly more pronounced.  Plantars downgoing bilaterally Cerebellar: Normal finger-to-nose,  normal heel-to-shin test.      No results found for this basename: cbc, bmp, coags, chol, tri, ldl, hga1c    Results for orders placed during the hospital encounter of 11/12/11 (from the past 48 hour(s))  CBC     Status: Abnormal   Collection Time   11/12/11 10:00 PM  Component Value Range Comment   WBC 9.2  4.0 - 10.5 K/uL    RBC 3.68 (*) 3.87 - 5.11 MIL/uL    Hemoglobin 11.3 (*) 12.0 - 15.0 g/dL    HCT 16.1 (*) 09.6 - 46.0 %    MCV 92.1  78.0 - 100.0 fL    MCH 30.7  26.0 - 34.0 pg    MCHC 33.3  30.0 - 36.0 g/dL    RDW 04.5  40.9 - 81.1 %    Platelets 194  150 - 400 K/uL   DIFFERENTIAL     Status: Normal   Collection Time   11/12/11 10:00 PM      Component Value Range Comment   Neutrophils Relative 52  43 - 77 %    Neutro Abs 4.8  1.7 - 7.7 K/uL    Lymphocytes Relative 36  12 - 46 %    Lymphs Abs 3.3  0.7 - 4.0 K/uL    Monocytes Relative 9  3 - 12 %    Monocytes Absolute 0.8  0.1 - 1.0 K/uL    Eosinophils Relative 3  0 - 5 %    Eosinophils Absolute 0.3  0.0 - 0.7 K/uL    Basophils Relative 0  0 - 1 %    Basophils Absolute 0.0  0.0 - 0.1 K/uL   BASIC METABOLIC PANEL     Status: Abnormal   Collection Time   11/12/11 10:00 PM      Component Value Range Comment   Sodium 133 (*) 135 - 145 mEq/L    Potassium 3.9  3.5 - 5.1 mEq/L    Chloride 97  96 - 112 mEq/L    CO2 25  19 - 32 mEq/L    Glucose, Bld 99  70 - 99 mg/dL    BUN 17  6 - 23 mg/dL    Creatinine, Ser 9.14  0.50 - 1.10 mg/dL    Calcium 9.3  8.4 - 78.2 mg/dL    GFR calc non Af Amer 83 (*) >90 mL/min    GFR calc Af Amer >90  >90 mL/min   GLUCOSE, CAPILLARY     Status: Abnormal   Collection Time   11/12/11 10:44 PM      Component Value Range Comment   Glucose-Capillary 102 (*) 70 - 99 mg/dL   URINALYSIS,  ROUTINE W REFLEX MICROSCOPIC     Status: Normal   Collection Time   11/13/11  1:07 AM      Component Value Range Comment   Color, Urine YELLOW  YELLOW    APPearance CLEAR  CLEAR    Specific Gravity, Urine 1.007  1.005 - 1.030    pH 6.0  5.0 - 8.0    Glucose, UA NEGATIVE  NEGATIVE mg/dL    Hgb urine dipstick NEGATIVE  NEGATIVE    Bilirubin Urine NEGATIVE  NEGATIVE    Ketones, ur NEGATIVE  NEGATIVE mg/dL    Protein, ur NEGATIVE  NEGATIVE mg/dL    Urobilinogen, UA 0.2  0.0 - 1.0 mg/dL    Nitrite NEGATIVE  NEGATIVE    Leukocytes, UA NEGATIVE  NEGATIVE MICROSCOPIC NOT DONE ON URINES WITH NEGATIVE PROTEIN, BLOOD, LEUKOCYTES, NITRITE, OR GLUCOSE <1000 mg/dL.  CBC     Status: Abnormal   Collection Time   11/13/11  6:35 AM      Component Value Range Comment   WBC 7.6  4.0 - 10.5 K/uL    RBC 3.63 (*) 3.87 - 5.11 MIL/uL  Hemoglobin 11.2 (*) 12.0 - 15.0 g/dL    HCT 62.9 (*) 52.8 - 46.0 %    MCV 92.3  78.0 - 100.0 fL    MCH 30.9  26.0 - 34.0 pg    MCHC 33.4  30.0 - 36.0 g/dL    RDW 41.3  24.4 - 01.0 %    Platelets 185  150 - 400 K/uL   CREATININE, SERUM     Status: Abnormal   Collection Time   11/13/11  6:35 AM      Component Value Range Comment   Creatinine, Ser 0.51  0.50 - 1.10 mg/dL    GFR calc non Af Amer 86 (*) >90 mL/min    GFR calc Af Amer >90  >90 mL/min     Ct Head Wo Contrast  11/13/2011  *RADIOLOGY REPORT*  Clinical Data: Acute onset left-sided weakness and parasthesias.  CT HEAD WITHOUT CONTRAST  Technique:  Contiguous axial images were obtained from the base of the skull through the vertex without contrast.  Comparison: 05/07/2010  Findings: There is no evidence of intracranial hemorrhage, brain edema or other signs of acute infarction.  There is no evidence of intracranial mass lesion or mass effect.  No abnormal extra-axial fluid collections are identified.  Mild diffuse cerebral atrophy is stable.  Moderate chronic small vessel disease also shows no significant change.  No  evidence of hydrocephalus.  No skull abnormality identified.  IMPRESSION:  1.  No acute intracranial abnormality. 2.  Stable cerebral atrophy and chronic small vessel disease.  Original Report Authenticated By: Danae Orleans, M.D.   2 D echo: Study Conclusions  - Left ventricle: The cavity size was normal. There was mild concentric hypertrophy. Systolic function was normal. The estimated ejection fraction was in the range of 60% to 65%.  - Left atrium: The atrium was normal in size. Transthoracic echocardiography. M-mode, complete 2D, spectral Doppler, and color Doppler. Height: Height: 157.5cm. Height: 62in. Weight: Weight: 72.6kg. Weight: 159.7lb. Body mass index: BMI: 29.3kg/m^2. Body surface area: BSA: 1.53m^2. Blood pressure: 124/72. Patient status: Inpatient. Location: Echo laboratory    Vascular Ultrasound  Carotid Duplex (Doppler) has been completed.  There is no evidence of internal carotid artery stenosis bilaterally. Right vertebral artery demonstrates antegrade flow. Left vertebral artery demonstrates loss of diastolic component, suggestive of a distal stenosis. Bilateral brachial pressures were obtained, the left was 121/74mmHg and the right was 134/26mmHg. The systolic difference of does not suggest a proximal stenosis.  MRI HEAD WITHOUT CONTRAST  MRA HEAD WITHOUT CONTRAST  Technique: Multiplanar, multiecho pulse sequences of the brain and  surrounding structures were obtained without intravenous contrast.  Angiographic images of the head were obtained using MRA technique  without contrast.  Comparison: CT 11/12/2011, MRI 07/30/2007  MRI HEAD  Findings: 10 mm area of restricted diffusion in the right cerebral  white matter compatible with acute infarction. This is located in  the deep white matter on the right. No other acute infarct is  identified.  Generalized atrophy. Chronic microvascular ischemia in the white  matter and pons. Prominent perivascular  spaces in the thalami and  basal ganglia and white matter.  Negative for hemorrhage or mass lesion.  IMPRESSION:  10 mm acute infarct in the deep cerebral white matter on the  right.  Atrophy and chronic microvascular ischemia.  MRA HEAD  Findings: There is diffuse atherosclerotic disease with irregular  intracranial vessels.  Distal left vertebral artery is occluded does not contribute to the  basilar.  There is disease in the distal right vertebral artery.  The left PICA is patent and filled from retrograde flow to the  distal left vertebral artery. The basilar is diffusely irregular  with moderate stenosis. Posterior cerebral arteries are patent  bilaterally.  Internal carotid artery is patent bilaterally. Atherosclerotic  disease is present diffusely with moderate stenosis bilaterally.  Anterior and middle cerebral arteries are patent bilaterally with  mild intracranial atherosclerotic disease. No large vessel  occlusion.  Negative for aneurysm.  IMPRESSION:  Diffuse intracranial atherosclerotic disease.  Occluded left vertebral artery      Assessment/Plan:    78 YO female with new 10 mm acute infarct in the deep cerebral white matter on the right. Likely small vessel in origin.  Patient was out of time window for tPA on arrival.  She is currently on ASA, Plavix and Crestor. Carotid doppler showed no ICA stenosis and 2 D echo was WNL.   Recommend: 1) HbA1C and FLP   2) follow up with Dr. Pearlean Brownie on outpatient basis 3) Continue ASA and Plavix   Felicie Morn PA-C [facility] [phone]  11/13/2011, 4:30 PM    Patient and family also complain of shortness of breath with exertion.  To be investigated by admitting physician.  Patient seen and examined. I agree with the above.  Thana Farr, MD [facility] [phone]  11/13/2011  6:38 PM

## 2011-11-13 NOTE — Progress Notes (Signed)
*  PRELIMINARY RESULTS* Vascular Ultrasound Carotid Duplex (Doppler) has been completed.   There is no evidence of internal carotid artery stenosis bilaterally. Right vertebral artery demonstrates antegrade flow. Left vertebral artery demonstrates loss of diastolic component, suggestive of a distal stenosis. Bilateral brachial pressures were obtained, the left was 121/30mmHg and the right was 134/13mmHg. The systolic difference of does not suggest a proximal stenosis.  11/13/2011 10:09 AM  Elpidio Galea, RDMS,RDCS

## 2011-11-13 NOTE — Evaluation (Signed)
Speech Language Pathology Evaluation Patient Details Name: Gina Bowers MRN: [id number] DOB: [birth year] Today's Date: 11/13/2011 Time: 0865-7846 SLP Time Calculation (min): 29 min  Problem List:  Patient Active Problem List  Diagnosis  . HYPOTHYROIDISM  . HYPERLIPIDEMIA  . ANXIETY  . OBSTRUCTIVE SLEEP APNEA  . STROKE  . PULMONARY NODULE  . Hypoxemia  . TIA (transient ischemic attack)  . Weakness   Past Medical History:  Past Medical History  Diagnosis Date  . Coronary artery disease     DR. Alanda Amass IS PT'S CARDIOLOGIST - HX CABG AND HAS STENT AND TOLD SHE HAS ANOTHER BLOCKAGE-BEING TREATED MEDICALLY  . Stroke     SEVERAL STROKES -TOLD SHE HAS CEREBELLUM BLOCKAGE AS RESULT OF STROKE--BUT NEUROLOGIST SAID HE DID NOT WANT TO DO ANY PROCEDURE BECAUSE OF HER AGE .  PT HAS SLIGHT LEFT FOOT DROP-DRAGS FOOT WHEN WALKING - USES WALKER  . High cholesterol   . Lung nodules     TOLD SHE HAS INTERSTITUAL LUNG DISEASE  . Shortness of breath     AND WHEEZING AT TIMES--NO INHALERS  . Vertigo   . Arthritis   . GERD (gastroesophageal reflux disease)   . Urinary incontinence   . UTI (lower urinary tract infection)     HX OF UTI'S-- LAST TIME COUPLE OF MONTHS AGO  . Hypothyroidism   . Blood transfusion 2007    AFTER HIP REPLACEMENT  . Fracture FEB 2013    FRACTURED LEFT ANKLE--NO SURGERY--WAS IN BOOT UNTIL COUPLE WEEKS AGO  . Depression   . Kidney stones     IN THE PAST  . Carotid bruit     PT'S DAUGHTER STATES PT HAD RECENT CAROTID STUDY AND WAS TOLD NO SIGNIFICANT BLOCKAGES  . [place] virus     COUPLE OF WEEKS AGO--ALL SYMTOMS RESOLVED PER PT   Past Surgical History:  Past Surgical History  Procedure Date  . Coronary angioplasty 10/2010    WITH STENT PLACEMENT  . Coronary artery bypass graft 2009  . Joint replacement 2007    HIP REPLACEMENT -LEFT  . Bladder tack     1969 AND 1990  . Cystoscopy with injection 09/21/2011    Procedure: CYSTOSCOPY WITH INJECTION;  Surgeon:  Kathi Ludwig, MD;  Location: [facility];  Service: Urology;  Laterality: N/A;  Peri Urethral Macroplastique Injection and Estring Placement   HPI:  78 y.o. female admitted on 6/20 after several hours of left sided weakness and slight facial droop.  Most of her symptoms resolved except the facial weakness, CT was negative for an acute infarct.    Assessment / Plan / Recommendation Clinical Impression  Demonstrates a borderline mild dysarthria marked primarily by decreased breath support, which the patient's granddaughter reports has been her baseline for 1 week since a stress test.  Patient does show mild articulatory impreciseness however it does not impact her functional communication.  All other cognitive-linguistic skills are [facility].  No further skilled SLP services at this time.    SLP Assessment  Patient does not need any further Speech Lanaguage Pathology Services    Follow Up Recommendations  None    Pertinent Vitals/Pain n/a    SLP Evaluation Prior Functioning  Cognitive/Linguistic Baseline: Within functional limits Type of Home: Independent living facility Available Help at Discharge: Personal care attendant Vocation: Retired   IT consultant  Overall Cognitive Status: Appears within functional limits for tasks assessed Arousal/Alertness: Awake/alert Orientation Level: Oriented X4 Attention: Selective Memory: Appears intact Awareness: Appears intact Problem Solving: Appears intact  Safety/Judgment: Appears intact    Comprehension  Auditory Comprehension Overall Auditory Comprehension: Appears within functional limits for tasks assessed Yes/No Questions: Within Functional Limits Commands: Within Functional Limits Conversation: Complex Visual Recognition/Discrimination Discrimination: Within Function Limits Reading Comprehension Reading Status: Within funtional limits    Expression Expression Primary Mode of Expression: Verbal Verbal Expression Overall Verbal  Expression: Appears within functional limits for tasks assessed Written Expression Dominant Hand: Right Written Expression: Within Functional Limits   Oral / Motor Oral Motor/Sensory Function Overall Oral Motor/Sensory Function: Impaired Labial ROM: Reduced left Labial Symmetry: Abnormal symmetry left Labial Strength: Reduced Labial Sensation: Within Functional Limits Lingual ROM: Within Functional Limits Lingual Symmetry: Within Functional Limits Lingual Strength: Within Functional Limits Lingual Sensation: Within Functional Limits Facial ROM: Reduced left Facial Symmetry: Left droop Facial Strength: Within Functional Limits Facial Sensation: Within Functional Limits Velum: Within Functional Limits Mandible: Within Functional Limits Motor Speech Overall Motor Speech: Impaired Respiration: Impaired (family reports baseline x1 week since stress test) Level of Impairment: Conversation Phonation: Normal Resonance: Within functional limits Articulation: Within functional limitis Intelligibility: Intelligible Motor Planning: Witnin functional limits     Myra Rude, M.S.,CCC-SLP Pager [phone] 11/13/2011, 1:42 PM

## 2011-11-13 NOTE — ED Provider Notes (Signed)
History     CSN: [id number]  Arrival date & time 11/12/11  2156   First MD Initiated Contact with Patient 11/12/11 2304      Chief Complaint  Patient presents with  . Weakness    (Consider location/radiation/quality/duration/timing/severity/associated sxs/prior treatment) HPI Pt presents with left sided weakness of arm and leg, also slurred speech. Pt states this began approx 5pm today.  She states it has improved over the course of the past several hours.  No changes in vision.  Felt she was having to pull left leg behind her when walking.  Denies recent illness, no fevers, no cough, no vomiting/diarrhea.  Has felt generally fatigued for the past several weeks.  States she has a hx of prior CVA/TIAs, but no symptoms similar to this in the past.  Pt states she takes plavix for prior strokes.   Past Medical History  Diagnosis Date  . Coronary artery disease     DR. Alanda Amass IS PT'S CARDIOLOGIST - HX CABG AND HAS STENT AND TOLD SHE HAS ANOTHER BLOCKAGE-BEING TREATED MEDICALLY  . Stroke     SEVERAL STROKES -TOLD SHE HAS CEREBELLUM BLOCKAGE AS RESULT OF STROKE--BUT NEUROLOGIST SAID HE DID NOT WANT TO DO ANY PROCEDURE BECAUSE OF HER AGE .  PT HAS SLIGHT LEFT FOOT DROP-DRAGS FOOT WHEN WALKING - USES WALKER  . High cholesterol   . Lung nodules     TOLD SHE HAS INTERSTITUAL LUNG DISEASE  . Shortness of breath     AND WHEEZING AT TIMES--NO INHALERS  . Vertigo   . Arthritis   . GERD (gastroesophageal reflux disease)   . Urinary incontinence   . UTI (lower urinary tract infection)     HX OF UTI'S-- LAST TIME COUPLE OF MONTHS AGO  . Hypothyroidism   . Blood transfusion 2007    AFTER HIP REPLACEMENT  . Fracture FEB 2013    FRACTURED LEFT ANKLE--NO SURGERY--WAS IN BOOT UNTIL COUPLE WEEKS AGO  . Depression   . Kidney stones     IN THE PAST  . Carotid bruit     PT'S DAUGHTER STATES PT HAD RECENT CAROTID STUDY AND WAS TOLD NO SIGNIFICANT BLOCKAGES  . [place] virus     COUPLE OF WEEKS  AGO--ALL SYMTOMS RESOLVED PER PT    Past Surgical History  Procedure Date  . Coronary angioplasty 10/2010    WITH STENT PLACEMENT  . Coronary artery bypass graft 2009  . Joint replacement 2007    HIP REPLACEMENT -LEFT  . Bladder tack     1969 AND 1990  . Cystoscopy with injection 09/21/2011    Procedure: CYSTOSCOPY WITH INJECTION;  Surgeon: Kathi Ludwig, MD;  Location: [facility];  Service: Urology;  Laterality: N/A;  Peri Urethral Macroplastique Injection and Estring Placement    History reviewed. No pertinent family history.  History  Substance Use Topics  . Smoking status: Never Smoker   . Smokeless tobacco: Never Used  . Alcohol Use: No    OB History    Grav Para Term Preterm Abortions TAB SAB Ect Mult Living                  Review of Systems ROS reviewed and all otherwise negative except for mentioned in HPI  Allergies  Sulfonamide derivatives  Home Medications   No current outpatient prescriptions on file.  BP 124/72  Pulse 54  Temp 97.7 F (36.5 C) (Oral)  Resp 20  Wt 163 lb 2.3 oz (74 kg)  SpO2 97% Vitals  reviewed Physical Exam Physical Examination: General appearance - alert, well appearing, and in no distress Mental status - alert, oriented to person, place, and time Eyes - pupils equal and reactive, extraocular eye movements intact Mouth - mucous membranes moist, pharynx normal without lesions Chest - clear to auscultation, no wheezes, rales or rhonchi, symmetric air entry Heart - normal rate, regular rhythm, normal S1, S2, no murmurs, rubs, clicks or gallops Abdomen - soft, nontender, nondistended, no masses or organomegaly, nabs Neurological - alert, oriented, somewhat slurred speech, mild left sided facial droop, remainder of cranial nerves tested and intact, strength 4+/5 in LUE and LLE, sensation intact, FTN/HTS intact Extremities - peripheral pulses normal, no pedal edema, no clubbing or cyanosis Skin - normal coloration and turgor, no  rashes  ED Course  Procedures (including critical care time)   Date: 11/13/2011  Rate: 61  Rhythm: normal sinus rhythm  QRS Axis: normal  Intervals: normal  ST/T Wave abnormalities: normal  Conduction Disutrbances:none  Narrative Interpretation:   Old EKG Reviewed: unchanged    2:40 AM discussed with triad for admission, pt admitted to tele bed- [facility] team 8.   Labs Reviewed  CBC - Abnormal; Notable for the following:    RBC 3.68 (*)     Hemoglobin 11.3 (*)     HCT 33.9 (*)     All other components within normal limits  BASIC METABOLIC PANEL - Abnormal; Notable for the following:    Sodium 133 (*)     GFR calc non Af Amer 83 (*)     All other components within normal limits  GLUCOSE, CAPILLARY - Abnormal; Notable for the following:    Glucose-Capillary 102 (*)     All other components within normal limits  DIFFERENTIAL  URINALYSIS, ROUTINE W REFLEX MICROSCOPIC  HEMOGLOBIN A1C  LIPID PANEL  CBC  CREATININE, SERUM   Ct Head Wo Contrast  11/13/2011  *RADIOLOGY REPORT*  Clinical Data: Acute onset left-sided weakness and parasthesias.  CT HEAD WITHOUT CONTRAST  Technique:  Contiguous axial images were obtained from the base of the skull through the vertex without contrast.  Comparison: 05/07/2010  Findings: There is no evidence of intracranial hemorrhage, brain edema or other signs of acute infarction.  There is no evidence of intracranial mass lesion or mass effect.  No abnormal extra-axial fluid collections are identified.  Mild diffuse cerebral atrophy is stable.  Moderate chronic small vessel disease also shows no significant change.  No evidence of hydrocephalus.  No skull abnormality identified.  IMPRESSION:  1.  No acute intracranial abnormality. 2.  Stable cerebral atrophy and chronic small vessel disease.  Original Report Authenticated By: Danae Orleans, M.D.     1. TIA (transient ischemic attack)   2. Weakness       MDM   Pt presenting with several hours of  left sided weakness and facial droop- symptoms are improving but still present.  Head CT reassuring.  Pt taking plavix due to prior CVA/TIAs per her report.  Pt admitted to medicine service for further evaluation.        Ethelda Chick, MD 11/13/11 [phone]

## 2011-11-13 NOTE — Progress Notes (Addendum)
Subjective: Still has a mild facial droop  Objective: Vital signs in last 24 hours: Filed Vitals:   11/13/11 0428 11/13/11 0546 11/13/11 0602 11/13/11 0800  BP: 154/83 124/72  130/70  Pulse: 59 54  60  Temp: 97.6 F (36.4 C) 97.7 F (36.5 C)  98 F (36.7 C)  TempSrc: Oral Oral  Oral  Resp: 20 20  18   Weight:   74 kg (163 lb 2.3 oz)   SpO2: 97% 97%  98%   No intake or output data in the 24 hours ending 11/13/11 1207  Weight change:   Constitutional: She appears well-developed and well-nourished.  HENT:  Head: Normocephalic and atraumatic.  Right Ear: External ear normal.  Left Ear: External ear normal.  Nose: Nose normal.  Mouth/Throat: Oropharynx is clear and moist.  Eyes: Conjunctivae and EOM are normal. Pupils are equal, round, and reactive to light.  Neck: Normal range of motion. Neck supple.  Cardiovascular: Normal rate, regular rhythm and intact distal pulses.  Murmur heard.  Respiratory: Effort normal and breath sounds normal.  GI: Soft. Bowel sounds are normal.  Musculoskeletal: Normal range of motion.  Neurological: She is alert. She has normal strength. No cranial nerve deficit or sensory deficit. She displays a negative Romberg sign. GCS eye subscore is 4. GCS verbal subscore is 5. GCS motor subscore is 6.  Has mild facial asymmetry still visible  Skin: Skin is warm and dry.  Psychiatric: She has a normal mood and affect. Her behavior is normal. Judgment and thought content normal.       Lab Results: Results for orders placed during the hospital encounter of 11/12/11 (from the past 24 hour(s))  CBC     Status: Abnormal   Collection Time   11/12/11 10:00 PM      Component Value Range   WBC 9.2  4.0 - 10.5 K/uL   RBC 3.68 (*) 3.87 - 5.11 MIL/uL   Hemoglobin 11.3 (*) 12.0 - 15.0 g/dL   HCT 16.1 (*) 09.6 - 04.5 %   MCV 92.1  78.0 - 100.0 fL   MCH 30.7  26.0 - 34.0 pg   MCHC 33.3  30.0 - 36.0 g/dL   RDW 40.9  81.1 - 91.4 %   Platelets 194  150 - 400 K/uL   DIFFERENTIAL     Status: Normal   Collection Time   11/12/11 10:00 PM      Component Value Range   Neutrophils Relative 52  43 - 77 %   Neutro Abs 4.8  1.7 - 7.7 K/uL   Lymphocytes Relative 36  12 - 46 %   Lymphs Abs 3.3  0.7 - 4.0 K/uL   Monocytes Relative 9  3 - 12 %   Monocytes Absolute 0.8  0.1 - 1.0 K/uL   Eosinophils Relative 3  0 - 5 %   Eosinophils Absolute 0.3  0.0 - 0.7 K/uL   Basophils Relative 0  0 - 1 %   Basophils Absolute 0.0  0.0 - 0.1 K/uL  BASIC METABOLIC PANEL     Status: Abnormal   Collection Time   11/12/11 10:00 PM      Component Value Range   Sodium 133 (*) 135 - 145 mEq/L   Potassium 3.9  3.5 - 5.1 mEq/L   Chloride 97  96 - 112 mEq/L   CO2 25  19 - 32 mEq/L   Glucose, Bld 99  70 - 99 mg/dL   BUN 17  6 - 23  mg/dL   Creatinine, Ser 1.61  0.50 - 1.10 mg/dL   Calcium 9.3  8.4 - 09.6 mg/dL   GFR calc non Af Amer 83 (*) >90 mL/min   GFR calc Af Amer >90  >90 mL/min  GLUCOSE, CAPILLARY     Status: Abnormal   Collection Time   11/12/11 10:44 PM      Component Value Range   Glucose-Capillary 102 (*) 70 - 99 mg/dL  URINALYSIS, ROUTINE W REFLEX MICROSCOPIC     Status: Normal   Collection Time   11/13/11  1:07 AM      Component Value Range   Color, Urine YELLOW  YELLOW   APPearance CLEAR  CLEAR   Specific Gravity, Urine 1.007  1.005 - 1.030   pH 6.0  5.0 - 8.0   Glucose, UA NEGATIVE  NEGATIVE mg/dL   Hgb urine dipstick NEGATIVE  NEGATIVE   Bilirubin Urine NEGATIVE  NEGATIVE   Ketones, ur NEGATIVE  NEGATIVE mg/dL   Protein, ur NEGATIVE  NEGATIVE mg/dL   Urobilinogen, UA 0.2  0.0 - 1.0 mg/dL   Nitrite NEGATIVE  NEGATIVE   Leukocytes, UA NEGATIVE  NEGATIVE  CBC     Status: Abnormal   Collection Time   11/13/11  6:35 AM      Component Value Range   WBC 7.6  4.0 - 10.5 K/uL   RBC 3.63 (*) 3.87 - 5.11 MIL/uL   Hemoglobin 11.2 (*) 12.0 - 15.0 g/dL   HCT 04.5 (*) 40.9 - 81.1 %   MCV 92.3  78.0 - 100.0 fL   MCH 30.9  26.0 - 34.0 pg   MCHC 33.4  30.0 - 36.0  g/dL   RDW 91.4  78.2 - 95.6 %   Platelets 185  150 - 400 K/uL  CREATININE, SERUM     Status: Abnormal   Collection Time   11/13/11  6:35 AM      Component Value Range   Creatinine, Ser 0.51  0.50 - 1.10 mg/dL   GFR calc non Af Amer 86 (*) >90 mL/min   GFR calc Af Amer >90  >90 mL/min     Micro: No results found for this or any previous visit (from the past 240 hour(s)).  Studies/Results: Ct Head Wo Contrast  11/13/2011  *RADIOLOGY REPORT*  Clinical Data: Acute onset left-sided weakness and parasthesias.  CT HEAD WITHOUT CONTRAST  Technique:  Contiguous axial images were obtained from the base of the skull through the vertex without contrast.  Comparison: 05/07/2010  Findings: There is no evidence of intracranial hemorrhage, brain edema or other signs of acute infarction.  There is no evidence of intracranial mass lesion or mass effect.  No abnormal extra-axial fluid collections are identified.  Mild diffuse cerebral atrophy is stable.  Moderate chronic small vessel disease also shows no significant change.  No evidence of hydrocephalus.  No skull abnormality identified.  IMPRESSION:  1.  No acute intracranial abnormality. 2.  Stable cerebral atrophy and chronic small vessel disease.  Original Report Authenticated By: Danae Orleans, M.D.    Medications:  Scheduled Meds:   . aspirin EC  81 mg Oral Q breakfast  . atorvastatin  10 mg Oral q1800  . clopidogrel  75 mg Oral Q breakfast  . darifenacin  7.5 mg Oral Daily  . enoxaparin  30 mg Subcutaneous Q24H  . levothyroxine  50 mcg Oral Q breakfast  . multivitamin with minerals  1 tablet Oral Q breakfast  . nitrofurantoin  100  mg Oral Q breakfast  . pantoprazole  40 mg Oral Q breakfast  . sertraline  75 mg Oral QPM   Continuous Infusions:  PRN Meds:.ondansetron (ZOFRAN) IV, senna-docusate   Assessment: Active Problems:  HYPOTHYROIDISM  STROKE  Hypoxemia  TIA (transient ischemic attack)  Weakness   Plan: #1 acute CVA/TIA,  previous history of multiple strokes, on aspirin and Plavix, MRI and MRA of the brain is pending, 2-D echo completed but the results are awaited. Neurology consultation obtain because of recurrent strokes #2 generalized weakness could be secondary to TIA,  PT OT eval pending #3 chronic bronchitis, stable Will start when necessary Xopenex as needed #4 hypothyroidism continue Synthroid #5 gastroesophageal reflux disease continue PPI    LOS: 1 day   [facility] 11/13/2011, 12:07 PM

## 2011-11-13 NOTE — Progress Notes (Signed)
  Echocardiogram 2D Echocardiogram has been performed.  Cathie Beams 11/13/2011, 9:26 AM

## 2011-11-13 NOTE — Discharge Instructions (Signed)
STROKE/TIA DISCHARGE INSTRUCTIONS SMOKING Cigarette smoking nearly doubles your risk of having a stroke & is the single most alterable risk factor  If you smoke or have smoked in the last 12 months, you are advised to quit smoking for your health.  Most of the excess cardiovascular risk related to smoking disappears within a year of stopping.  Ask you doctor about anti-smoking medications  [place] Quit Line: 1-800-QUIT NOW  Free Smoking Cessation Classes ([phone]  CHOLESTEROL Know your levels; limit fat & cholesterol in your diet  Lipid Panel  No results found for this basename: chol, trig, hdl, cholhdl, vldl, ldlcalc      Many patients benefit from treatment even if their cholesterol is at goal.  Goal: Total Cholesterol (CHOL) less than 160  Goal:  Triglycerides (TRIG) less than 150  Goal:  HDL greater than 40  Goal:  LDL (LDLCALC) less than 100   BLOOD PRESSURE American Stroke Association blood pressure target is less that 120/80 mm/Hg  Your discharge blood pressure is:  BP: 124/72 mmHg  Monitor your blood pressure  Limit your salt and alcohol intake  Many individuals will require more than one medication for high blood pressure  DIABETES (A1c is a blood sugar average for last 3 months) Goal HGBA1c is under 7% (HBGA1c is blood sugar average for last 3 months)  Diabetes: {STROKE DC DIABETES:22357}    Lab Results  Component Value Date   HGBA1C  Value: 5.6 (NOTE)   The ADA recommends the following therapeutic goals for glycemic   control related to Hgb A1C measurement:   Goal of Therapy:   < 7.0% Hgb A1C   Action Suggested:  > 8.0% Hgb A1C   Ref:  Diabetes Care, 22, Suppl. 1, 1999 06/30/2007     Your HGBA1c can be lowered with medications, healthy diet, and exercise.  Check your blood sugar as directed by your physician  Call your physician if you experience unexplained or low blood sugars.  PHYSICAL ACTIVITY/REHABILITATION Goal is 30 minutes at least 4 days per week   {STROKE DC ACTIVITY/REHAB:22359}  Activity decreases your risk of heart attack and stroke and makes your heart stronger.  It helps control your weight and blood pressure; helps you relax and can improve your mood.  Participate in a regular exercise program.  Talk with your doctor about the best form of exercise for you (dancing, walking, swimming, cycling).  DIET/WEIGHT Goal is to maintain a healthy weight  Your discharge diet is: Cardiac *** liquids Your height is:    Your current weight is: Weight: 74 kg (163 lb 2.3 oz) Your Body Mass Index (BMI) is:     Following the type of diet specifically designed for you will help prevent another stroke.  Your goal weight range is:  ***  Your goal Body Mass Index (BMI) is 19-24.  Healthy food habits can help reduce 3 risk factors for stroke:  High cholesterol, hypertension, and excess weight.  RESOURCES Stroke/Support Group:  Call [phone]  they meet the 3rd Sunday of the month on the Rehab Unit at [facility], [place] ( no meetings June, July & Aug).  STROKE EDUCATION PROVIDED/REVIEWED AND GIVEN TO PATIENT Stroke warning signs and symptoms How to activate emergency medical system (call 911). Medications prescribed at discharge. Need for follow-up after discharge. Personal risk factors for stroke. Pneumonia vaccine given:   {STROKE DC YES/NO/DATE:22363} Flu vaccine given:   {STROKE DC YES/NO/DATE:22363} My questions have been answered, the writing is legible, and I  understand these instructions.  I will adhere to these goals & educational materials that have been provided to me after my discharge from the hospital.

## 2011-11-13 NOTE — H&P (Signed)
Gina Bowers is an 78 y.o. female.   Chief Complaint: Weakness HPI: An 78 year old female with history of previous stroke on other multiple medical problems brought in by caregiver secondary to onset of left-sided weakness to today. Symptoms started around 5 PM on June 20. Most of the symptoms have resolved by the time she gets the emergency room. She still has some residual facial asymmetry. She's had a previous stroke that she recovered from fully. Has been on aspirin and Plavix. Denied any recent fall or trauma. Initial head CT without contrast showed no acute CVA. Patient however is at risk and is being admitted for father workup. She is awake alert able to communicate. She has no pain and no other areas of weakness except as described.  Past Medical History  Diagnosis Date  . Coronary artery disease     DR. Alanda Amass IS PT'S CARDIOLOGIST - HX CABG AND HAS STENT AND TOLD SHE HAS ANOTHER BLOCKAGE-BEING TREATED MEDICALLY  . Stroke     SEVERAL STROKES -TOLD SHE HAS CEREBELLUM BLOCKAGE AS RESULT OF STROKE--BUT NEUROLOGIST SAID HE DID NOT WANT TO DO ANY PROCEDURE BECAUSE OF HER AGE .  PT HAS SLIGHT LEFT FOOT DROP-DRAGS FOOT WHEN WALKING - USES WALKER  . High cholesterol   . Lung nodules     TOLD SHE HAS INTERSTITUAL LUNG DISEASE  . Shortness of breath     AND WHEEZING AT TIMES--NO INHALERS  . Vertigo   . Arthritis   . GERD (gastroesophageal reflux disease)   . Urinary incontinence   . UTI (lower urinary tract infection)     HX OF UTI'S-- LAST TIME COUPLE OF MONTHS AGO  . Hypothyroidism   . Blood transfusion 2007    AFTER HIP REPLACEMENT  . Fracture FEB 2013    FRACTURED LEFT ANKLE--NO SURGERY--WAS IN BOOT UNTIL COUPLE WEEKS AGO  . Depression   . Kidney stones     IN THE PAST  . Carotid bruit     PT'S DAUGHTER STATES PT HAD RECENT CAROTID STUDY AND WAS TOLD NO SIGNIFICANT BLOCKAGES  . [place] virus     COUPLE OF WEEKS AGO--ALL SYMTOMS RESOLVED PER PT    Past Surgical History    Procedure Date  . Coronary angioplasty 10/2010    WITH STENT PLACEMENT  . Coronary artery bypass graft 2009  . Joint replacement 2007    HIP REPLACEMENT -LEFT  . Bladder tack     1969 AND 1990  . Cystoscopy with injection 09/21/2011    Procedure: CYSTOSCOPY WITH INJECTION;  Surgeon: Kathi Ludwig, MD;  Location: [facility];  Service: Urology;  Laterality: N/A;  Peri Urethral Macroplastique Injection and Estring Placement    History reviewed. No pertinent family history. Social History:  reports that she has never smoked. She has never used smokeless tobacco. She reports that she does not drink alcohol or use illicit drugs.  Allergies:  Allergies  Allergen Reactions  . Sulfonamide Derivatives Hives    Medications Prior to Admission  Medication Sig Dispense Refill  . aspirin EC 81 MG tablet Take 81 mg by mouth daily with breakfast.      . clopidogrel (PLAVIX) 75 MG tablet Take 75 mg by mouth daily with breakfast.      . fish oil-omega-3 fatty acids 1000 MG capsule Take 1 g by mouth daily with breakfast.      . levothyroxine (SYNTHROID, LEVOTHROID) 50 MCG tablet Take 50 mcg by mouth daily with breakfast.      .  Multiple Vitamin (MULITIVITAMIN WITH MINERALS) TABS Take 1 tablet by mouth daily with breakfast.      . nitrofurantoin (MACRODANTIN) 100 MG capsule Take 100 mg by mouth daily with breakfast.      . OVER THE COUNTER MEDICATION IMMUNE BOOSTER -OTC      . pantoprazole (PROTONIX) 40 MG tablet Take 40 mg by mouth daily with breakfast.      . rosuvastatin (CRESTOR) 20 MG tablet Take 20 mg by mouth every evening.       . sertraline (ZOLOFT) 50 MG tablet Take 75 mg by mouth every evening. Patient takes 1 and 1/2 tablets by mouth every day      . trospium (SANCTURA) 20 MG tablet Take 60 mg by mouth daily.        Results for orders placed during the hospital encounter of 11/12/11 (from the past 48 hour(s))  CBC     Status: Abnormal   Collection Time   11/12/11 10:00 PM       Component Value Range Comment   WBC 9.2  4.0 - 10.5 K/uL    RBC 3.68 (*) 3.87 - 5.11 MIL/uL    Hemoglobin 11.3 (*) 12.0 - 15.0 g/dL    HCT 96.0 (*) 45.4 - 46.0 %    MCV 92.1  78.0 - 100.0 fL    MCH 30.7  26.0 - 34.0 pg    MCHC 33.3  30.0 - 36.0 g/dL    RDW 09.8  11.9 - 14.7 %    Platelets 194  150 - 400 K/uL   DIFFERENTIAL     Status: Normal   Collection Time   11/12/11 10:00 PM      Component Value Range Comment   Neutrophils Relative 52  43 - 77 %    Neutro Abs 4.8  1.7 - 7.7 K/uL    Lymphocytes Relative 36  12 - 46 %    Lymphs Abs 3.3  0.7 - 4.0 K/uL    Monocytes Relative 9  3 - 12 %    Monocytes Absolute 0.8  0.1 - 1.0 K/uL    Eosinophils Relative 3  0 - 5 %    Eosinophils Absolute 0.3  0.0 - 0.7 K/uL    Basophils Relative 0  0 - 1 %    Basophils Absolute 0.0  0.0 - 0.1 K/uL   BASIC METABOLIC PANEL     Status: Abnormal   Collection Time   11/12/11 10:00 PM      Component Value Range Comment   Sodium 133 (*) 135 - 145 mEq/L    Potassium 3.9  3.5 - 5.1 mEq/L    Chloride 97  96 - 112 mEq/L    CO2 25  19 - 32 mEq/L    Glucose, Bld 99  70 - 99 mg/dL    BUN 17  6 - 23 mg/dL    Creatinine, Ser 8.29  0.50 - 1.10 mg/dL    Calcium 9.3  8.4 - 56.2 mg/dL    GFR calc non Af Amer 83 (*) >90 mL/min    GFR calc Af Amer >90  >90 mL/min   GLUCOSE, CAPILLARY     Status: Abnormal   Collection Time   11/12/11 10:44 PM      Component Value Range Comment   Glucose-Capillary 102 (*) 70 - 99 mg/dL   URINALYSIS, ROUTINE W REFLEX MICROSCOPIC     Status: Normal   Collection Time   11/13/11  1:07 AM  Component Value Range Comment   Color, Urine YELLOW  YELLOW    APPearance CLEAR  CLEAR    Specific Gravity, Urine 1.007  1.005 - 1.030    pH 6.0  5.0 - 8.0    Glucose, UA NEGATIVE  NEGATIVE mg/dL    Hgb urine dipstick NEGATIVE  NEGATIVE    Bilirubin Urine NEGATIVE  NEGATIVE    Ketones, ur NEGATIVE  NEGATIVE mg/dL    Protein, ur NEGATIVE  NEGATIVE mg/dL    Urobilinogen, UA 0.2  0.0 - 1.0  mg/dL    Nitrite NEGATIVE  NEGATIVE    Leukocytes, UA NEGATIVE  NEGATIVE MICROSCOPIC NOT DONE ON URINES WITH NEGATIVE PROTEIN, BLOOD, LEUKOCYTES, NITRITE, OR GLUCOSE <1000 mg/dL.   Ct Head Wo Contrast  11/13/2011  *RADIOLOGY REPORT*  Clinical Data: Acute onset left-sided weakness and parasthesias.  CT HEAD WITHOUT CONTRAST  Technique:  Contiguous axial images were obtained from the base of the skull through the vertex without contrast.  Comparison: 05/07/2010  Findings: There is no evidence of intracranial hemorrhage, brain edema or other signs of acute infarction.  There is no evidence of intracranial mass lesion or mass effect.  No abnormal extra-axial fluid collections are identified.  Mild diffuse cerebral atrophy is stable.  Moderate chronic small vessel disease also shows no significant change.  No evidence of hydrocephalus.  No skull abnormality identified.  IMPRESSION:  1.  No acute intracranial abnormality. 2.  Stable cerebral atrophy and chronic small vessel disease.  Original Report Authenticated By: Danae Orleans, M.D.    Review of Systems  Constitutional: Positive for malaise/fatigue.  HENT: Negative.   Eyes: Negative.   Respiratory: Negative.   Cardiovascular: Negative.   Gastrointestinal: Negative.   Genitourinary: Negative.   Musculoskeletal: Negative.   Skin: Negative.   Neurological: Positive for focal weakness and weakness. Negative for dizziness, tingling, tremors, sensory change, speech change, seizures and loss of consciousness.  Endo/Heme/Allergies: Negative.   Psychiatric/Behavioral: Negative.     Blood pressure 131/51, pulse 60, temperature 98.6 F (37 C), temperature source Oral, resp. rate 18, SpO2 95.00%. Physical Exam  Constitutional: She appears well-developed and well-nourished.  HENT:  Head: Normocephalic and atraumatic.  Right Ear: External ear normal.  Left Ear: External ear normal.  Nose: Nose normal.  Mouth/Throat: Oropharynx is clear and moist.    Eyes: Conjunctivae and EOM are normal. Pupils are equal, round, and reactive to light.  Neck: Normal range of motion. Neck supple.  Cardiovascular: Normal rate, regular rhythm and intact distal pulses.   Murmur heard. Respiratory: Effort normal and breath sounds normal.  GI: Soft. Bowel sounds are normal.  Musculoskeletal: Normal range of motion.  Neurological: She is alert. She has normal strength. No cranial nerve deficit or sensory deficit. She displays a negative Romberg sign. GCS eye subscore is 4. GCS verbal subscore is 5. GCS motor subscore is 6.       Has mild facial asymmetry still visible  Skin: Skin is warm and dry.  Psychiatric: She has a normal mood and affect. Her behavior is normal. Judgment and thought content normal.     Assessment/Plan An 78 year old female being admitted with TIA possibly from stroke. Patient has previous stroke and is at risk. Plan #1 acute CVA: Patient will be admitted to the hospital to the neuro floor. We'll check 2-D echo carotid Dopplers MRI MRA to see she has any strep. She is already on aspirin and plavix and we'll continue it. Due to her cardiac condition, she will  need to be on the Plavix as well. If she has any stroke on aspirin and Plavix, neurology will be involved to find alternative treatment as well. #2 weakness: Most likely from heart TIA. PT and OT will be involved.  #3 hypoxemia: This is chronic we'll put patient on oxygen. #4 hypothyroidism: Continue his levothyroxine check TSH. #5 hyperlipidemia: We'll check fasting lipid panel and continue with her statin.  Suesan Mohrmann,LAWAL 11/13/2011, 5:38 AM

## 2011-11-13 NOTE — Progress Notes (Signed)
UR COMPLETED  

## 2011-11-13 NOTE — Progress Notes (Signed)
Occupational Therapy Evaluation Patient Details Name: Gina Bowers MRN: [id number] DOB: 05/01/1926 Today's Date: 11/13/2011 Time: 7846-9629 OT Time Calculation (min): 27 min  OT Assessment / Plan / Recommendation Clinical Impression  Pt admitted with left sided weakness and presents with history of mutiple strokes. MRI results pending.  Will benefit from acute OT services to address below problem list in prep for d/c home with intial 24/7 assist from family.    OT Assessment  Patient needs continued OT Services    Follow Up Recommendations  Home health OT;Supervision/Assistance - 24 hour    Barriers to Discharge      Equipment Recommendations  None recommended by OT    Recommendations for Other Services    Frequency  Min 2X/week    Precautions / Restrictions Precautions Precautions: Fall Restrictions Weight Bearing Restrictions: No   Pertinent Vitals/Pain See vitals    ADL  Upper Body Dressing: Performed;Minimal assistance Where Assessed - Upper Body Dressing: Unsupported sitting Toilet Transfer: Simulated;Minimal assistance Toilet Transfer Method:  (ambulating) Toilet Transfer Equipment: Other (comment) (bed) Equipment Used: Gait belt (rollator) Transfers/Ambulation Related to ADLs: min assist with rollator due to left side weakness. minimal left foot drop due to old stroke. ADL Comments: Pt "wheezing" while supine in bed, but wheezing decreased during functional mobility.  Granddaughter reports this has been occuring recently PTA. Pt is near baseline level, however requiring min assist due to decreased L UE/LE strength/sensation.  Pt reports diffiulty holding ADL items (feeding utensils, toilet paper) with left hand.  While ambulating with rollator, pt demonstrated decreased proprioception when unaware of L hand placment until cued by therapist.    OT Diagnosis: Generalized weakness;Paresis  OT Problem List: Decreased strength;Decreased activity tolerance;Impaired UE  functional use;Impaired balance (sitting and/or standing) OT Treatment Interventions: Self-care/ADL training;DME and/or AE instruction;Therapeutic activities;Patient/family education;Balance training;Therapeutic exercise   OT Goals Acute Rehab OT Goals OT Goal Formulation: With patient Time For Goal Achievement: 11/20/11 Potential to Achieve Goals: Good ADL Goals Pt Will Perform Upper Body Dressing: with modified independence;Sitting, chair;Sitting, bed ADL Goal: Upper Body Dressing - Progress: Goal set today Pt Will Perform Lower Body Dressing: with modified independence;Sit to stand from chair;Sit to stand from bed ADL Goal: Lower Body Dressing - Progress: Goal set today Pt Will Transfer to Toilet: with modified independence;Ambulation;with DME;Comfort height toilet ADL Goal: Toilet Transfer - Progress: Goal set today Arm Goals Additional Arm Goal #1: Pt will perform LUE HEP to increase strength in prep for functional transfers. Arm Goal: Additional Goal #1 - Progress: Goal set today  Visit Information  Last OT Received On: 11/13/11 Assistance Needed: +1 PT/OT Co-Evaluation/Treatment: Yes    Subjective Data      Prior Functioning  Home Living Lives With: Alone Available Help at Discharge: Personal care attendant (1 hour a day) Type of Home: Apartment Home Access: Level entry;Elevator Home Layout: One level Bathroom Shower/Tub: Walk-in shower;Door [employer] Toilet: Handicapped height Bathroom Accessibility: Yes How Accessible: Accessible via walker Home Adaptive Equipment: Other (comment);Wheelchair - manual;Grab bars around toilet;Grab bars in shower;Bedside commode/3-in-1;Shower chair with back;Reacher (rollator, lift chair) Prior Function Level of Independence: Independent with assistive device(s) [facility] does all cleaning, meal prep, etc) Able to Take Stairs?: No Driving: No 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[employer], Investment banker, operational, 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  difficulties Dominant Hand: Right    Cognition  Overall Cognitive Status: Appears within functional limits for tasks assessed/performed Arousal/Alertness: Awake/alert Orientation Level: Appears intact for tasks assessed Behavior During Session: [facility] for tasks performed  Extremity/Trunk Assessment Right Upper Extremity Assessment RUE ROM/Strength/Tone: WFL for tasks assessed (3+/5 throughout) RUE Sensation: WFL - Light Touch;WFL - Proprioception RUE Coordination: WFL - gross/fine motor Left Upper Extremity Assessment LUE ROM/Strength/Tone: Deficits LUE ROM/Strength/Tone Deficits: 3/5 throughout.   LUE Sensation: Deficits LUE Sensation Deficits: Unaware of Left hand placement during functinoal tasks.  Reports feeling some numbness in LUE but able to detect light touch. LUE Coordination: Deficits LUE Coordination Deficits: Decreased FMC: requring increased time and effort   Mobility Bed Mobility Bed Mobility: Supine to Sit;Sit to Supine;Sitting - Scoot to Edge of Bed Supine to Sit: 4: Min assist;With rails;HOB flat Sitting - Scoot to Edge of Bed: 5: Supervision Sit to Supine: 5: Supervision;HOB flat Details for Bed Mobility Assistance: Assist to support trunk OOB. Supine <>sit required increased time. Transfers Transfers: Sit to Stand;Stand to Sit Sit to Stand: 4: Min assist;From bed;With upper extremity assist Stand to Sit: 4: Min guard;To bed Details for Transfer Assistance: Verbal cues to lock brakes before performing sit<>stand.   Pt descended to bed while hands still placed on rollator.   Exercise    Balance    End of Session OT - End of Session Equipment Utilized During Treatment: Gait belt;Other (comment) (rollator) Activity Tolerance: Patient limited by fatigue Patient left: in bed;with call bell/phone within reach;with family/visitor present Nurse Communication: Mobility status  11/13/2011 Cipriano Mile OTR/L Pager [phone] Office [phone]  Cipriano Mile 11/13/2011, 3:44 PM

## 2011-11-14 DIAGNOSIS — G8190 Hemiplegia, unspecified affecting unspecified side: Secondary | ICD-10-CM

## 2011-11-14 DIAGNOSIS — I6340 Cerebral infarction due to embolism of unspecified cerebral artery: Secondary | ICD-10-CM

## 2011-11-14 LAB — CARDIAC PANEL(CRET KIN+CKTOT+MB+TROPI)
Relative Index: INVALID (ref 0.0–2.5)
Relative Index: INVALID (ref 0.0–2.5)
Total CK: 33 U/L (ref 7–177)
Total CK: 34 U/L (ref 7–177)
Troponin I: <0.3 ng/mL (ref <0.30)
Troponin I: <0.3 ng/mL (ref <0.30)

## 2011-11-14 LAB — HEMOGLOBIN A1C
Hgb A1c MFr Bld: 5.7 % — ABNORMAL HIGH (ref <5.7)
Mean Plasma Glucose: 117 mg/dL — ABNORMAL HIGH (ref <117)

## 2011-11-14 LAB — LIPID PANEL
Cholesterol: 157 mg/dL (ref 0–200)
HDL: 59 mg/dL (ref >39)
Total CHOL/HDL Ratio: 2.7 RATIO

## 2011-11-14 MED ORDER — FUROSEMIDE 40 MG PO TABS
40.0000 mg | ORAL_TABLET | Freq: Every day | ORAL | Status: DC
  Filled 2011-11-14 (×3): qty 1

## 2011-11-14 MED ORDER — ASPIRIN EC 81 MG PO TBEC
81.0000 mg | DELAYED_RELEASE_TABLET | Freq: Every day | ORAL | Status: DC
  Administered 2011-11-15 – 2011-11-17 (×3): 81 mg via ORAL
  Filled 2011-11-14 (×4): qty 1

## 2011-11-14 MED ORDER — FUROSEMIDE 40 MG PO TABS: 40.0000 mg | ORAL_TABLET | Freq: Every day | ORAL | Status: DC

## 2011-11-14 MED ORDER — CLOPIDOGREL BISULFATE 75 MG PO TABS
75.0000 mg | ORAL_TABLET | Freq: Every day | ORAL | Status: DC
  Administered 2011-11-15 – 2011-11-17 (×3): 75 mg via ORAL
  Filled 2011-11-14 (×4): qty 1

## 2011-11-14 MED ORDER — LEVALBUTEROL TARTRATE 45 MCG/ACT IN AERO: 1.0000 | INHALATION_SPRAY | RESPIRATORY_TRACT | Status: DC | PRN

## 2011-11-14 NOTE — Progress Notes (Signed)
History: Gina Bowers is an 78 y.o. female with history of stroke and seen by Dr. Pearlean Brownie in out patient setting who was brought in by caregiver due to onset of left-sided weakness. Symptoms started around 5 PM on June 20 when she was eating. Per family member, she actually stood up and used walker to get back to bed (left arm kept falling off walker). When her caregiver came in to check on her she noted left facial droop and left sided weakness. Per notes, the majority of symptoms have resolved by the time she arrived at the emergency room other than some residual facial asymmetry. She's had a previous stroke that she recovered from fully and had been placed on aspirin and Plavix. Initial head CT without contrast showed no acute CVA.   LSN: [id number], 11/12/11 tPA Given:no, outside window  Subjective: Patient feeling well.  No discomfort.  Continues to have left sided weakness, slowly improving.  Lives at a retirement facility where meals are prepared, housework taken care of, no stairs, doesn't drive.   Patient and daughter concerned about episodes of shortness of breath; occur spontaneously, unrelated to activity, no chest pain or palpitations.  Objective: BP 117/73  Pulse 64  Temp 98.8 F (37.1 C) (Oral)  Resp 18  Wt 74 kg (163 lb 2.3 oz)  SpO2 96%  CBGs  Basename 11/12/11 2244  GLUCAP 102*   Diet: heart healthy, thin Activity: up with assist. DVT Prophylaxis: lovenox  Medications: Scheduled:   . aspirin EC  81 mg Oral Q breakfast  . atorvastatin  10 mg Oral q1800  . clopidogrel  75 mg Oral Q breakfast  . darifenacin  7.5 mg Oral Daily  . enoxaparin  30 mg Subcutaneous Q24H  . levothyroxine  50 mcg Oral Q breakfast  . multivitamin with minerals  1 tablet Oral Q breakfast  . nitrofurantoin  100 mg Oral Q breakfast  . pantoprazole  40 mg Oral Q breakfast  . sertraline  75 mg Oral QPM   Neurologic Exam: Mental Status: Alert, oriented, thought content appropriate.  Speech fluent  without evidence of aphasia. Able to follow 3 step commands without difficulty. Cranial Nerves: II- Visual fields grossly intact. III/IV/VI-Extraocular movements intact.  Pupils reactive bilaterally. V/VII-left lower facial droop VIII-hearing grossly intact XI-bilateral shoulder shrug, slightly weak on left XII-midline tongue extension Motor: 5/5 RUE/RLE with normal tone and bulk, 5-/5 LUE/LLE Sensory: Light touch intact throughout, bilaterally Deep Tendon Reflexes: 2+ and symmetric throughout Plantars: Downgoing bilaterally Cerebellar: Normal finger-to-nose, normal rapid alternating movements and normal heel-to-shin test, slightly slower on the left.  Lab Results: Basic Metabolic Panel:  Lab 11/13/11 7829 11/12/11 2200  NA -- 133*  K -- 3.9  CL -- 97  CO2 -- 25  GLUCOSE -- 99  BUN -- 17  CREATININE 0.51 0.57  CALCIUM -- 9.3  MG -- --  PHOS -- --   CBC:  Lab 11/13/11 0635 11/12/11 2200  WBC 7.6 9.2  NEUTROABS -- 4.8  HGB 11.2* 11.3*  HCT 33.5* 33.9*  MCV 92.3 92.1  PLT 185 194   CBG:  Lab 11/12/11 2244  GLUCAP 102*   Fasting Lipid Panel:  Lab 11/13/11 2357  CHOL 157  HDL 59  LDLCALC 75  TRIG 117  CHOLHDL 2.7  LDLDIRECT --   Urinalysis:  Lab 11/13/11 0107  COLORURINE YELLOW  LABSPEC 1.007  PHURINE 6.0  GLUCOSEU NEGATIVE  HGBUR NEGATIVE  BILIRUBINUR NEGATIVE  KETONESUR NEGATIVE  PROTEINUR NEGATIVE  UROBILINOGEN 0.2  NITRITE NEGATIVE  LEUKOCYTESUR NEGATIVE    Study Results:  11/13/2011 CHEST - 2 VIEW    Findings: Cardiac silhouette is normal size and shape. Ectasia and nonaneurysmal calcification of the thoracic aorta are seen. Mediastinal and hilar contours appear stable. The patient has undergone previous median sternotomy and coronary artery bypass grafting.  No pulmonary edema, pneumonia, or pleural effusion is evident.  There is a stable small calcified granuloma in the lower right lung.  No active granulomatous process is present.  There is  osteopenic appearance of the bones.  Changes of degenerative disc disease and degenerative spondylosis are present.  IMPRESSION: No acute cardiopulmonary or pleural abnormalities are evident. Post CABG.  Crawford Givens, M.D.   11/13/2011 CT HEAD WITHOUT CONTRAST   Findings: There is no evidence of intracranial hemorrhage, brain edema or other signs of acute infarction.  There is no evidence of intracranial mass lesion or mass effect.  No abnormal extra-axial fluid collections are identified.  Mild diffuse cerebral atrophy is stable.  Moderate chronic small vessel disease also shows no significant change.  No evidence of hydrocephalus.  No skull abnormality identified.  IMPRESSION:  1.  No acute intracranial abnormality. 2.  Stable cerebral atrophy and chronic small vessel disease.  Danae Orleans, M.D.   11/13/2011 MRI HEAD WITHOUT CONTRAST MRI HEAD  Findings:  10 mm area of restricted diffusion in the right cerebral white matter compatible with acute infarction.  This is located in the deep white matter on the right.  No other acute infarct is identified.  Generalized atrophy.  Chronic microvascular ischemia in the white matter and pons.  Prominent perivascular spaces in the thalami and basal ganglia and white matter.  Negative for hemorrhage or mass lesion.  IMPRESSION: 10 mm   acute infarct in the deep   cerebral white matter on the right.  Atrophy and chronic microvascular ischemia.  Camelia Phenes, M.D.  11/13/2011 MRA HEAD  Findings: There is diffuse atherosclerotic disease with irregular intracranial vessels.  Distal left vertebral artery is occluded does not contribute to the basilar.  There is disease in the distal right vertebral artery. The left PICA is patent and filled from  retrograde flow to the distal left vertebral artery.  The basilar is diffusely irregular with moderate stenosis.  Posterior cerebral arteries are patent bilaterally.  Internal carotid artery is patent bilaterally.  Atherosclerotic  disease is present diffusely with moderate stenosis bilaterally. Anterior and middle cerebral arteries are patent bilaterally with mild intracranial atherosclerotic disease.  No large vessel occlusion.  Negative for aneurysm.  IMPRESSION: Diffuse intracranial atherosclerotic disease.  Occluded left vertebral artery  Camelia Phenes, M.D.   11/13/11 EEG CONDITIONS OF RECORDING: This is a 16-channel EEG carried out with the patient in the asleep state.  DESCRIPTION: The background activity is slow and poorly organized. It is dominated by polymorphic delta activity. There are superimposed symmetrical sleep spindles and vertex central sharp transients. This activity was persistent throughout the tracing. Hyperventilation was not performed. Intermittent photic stimulation failed to elicit any change in the tracing.  IMPRESSION: This is a normal stage 2 sleep EEG. No epileptiform activity was noted.  11/13/11 2 D ECHO Study Conclusions: Left ventricle: The cavity size was normal. There was mild concentric hypertrophy. Systolic function was normal. The estimated ejection fraction was in the range of 60% to 65%. Doppler parameters are consistent with abnormal left ventricular relaxation (grade 1 diastolic dysfunction). The E/e' ratio is >10, suggesting elevated LV filling pressure. Left  atrium: The atrium was normal in size.  11/13/11 Carotid Dopplers: Carotid Duplex There is no evidence of internal carotid artery stenosis bilaterally. Right vertebral artery demonstrates antegrade flow. Left vertebral artery demonstrates loss of diastolic component, suggestive of a distal stenosis. Bilateral brachial pressures were obtained, the left was 121/4mmHg and the right was 134/23mmHg. The systolic difference of does not suggest a proximal stenosis.  Therapies: PT: HHPT OT: HHOT  Assessment: 78 YO female with new 10 mm acute infarct in the deep cerebral white matter on the right with improving left sided  weakness.   Likely small vessel in origin. Patient was out of time window for tPA on arrival. She is currently on ASA, Plavix and Crestor. Carotid doppler showed no ICA stenosis and 2 D echo was WNL. PT/OT has recommended HH therapies.   -LDL 75 -A1C pending -Shortness of breath, CXR unremarkable.    Plan: 1. A1C pending.  If high, would certainly need to be treated to goal <6.5 2. Continue ASA, plavix and Crestor.   3. Primary team to evaluate SOB 4. Follow up with Dr. Pearlean Brownie in 2 months. No further neurologic intervention is recommended at this time.  If further questions arise, please call or page at that time.  Thank you for allowing neurology to participate in the care of this patient.  Patient seen and examined by Dr. Lendell Caprice who agrees with above.   LOS: 2 days   Marya Fossa PA-C [facility] [phone] 11/14/2011  8:10 AM  I have seen and examined this patient and agree with the plan and examined as detailed above.  Kipp Laurence, MD [facility] Stroke Team [phone] 11/14/2011 11:12 AM

## 2011-11-14 NOTE — Progress Notes (Addendum)
Patient seen by Dr. Lulu Riding when the transfer of care  Happened  however I called her office and in am trying to clarify which attending is going to be taking care of this patient. For the time being I will sign off as Dr. Clent Ridges has already seen the patient

## 2011-11-14 NOTE — Plan of Care (Signed)
Problem: Phase II Progression Outcomes Goal: Neuro status stablized/improved Outcome: Progressing Patient able to ambulate with one standby assist.  Reports less weakness to left side. Goal: Hemodynamically stable Outcome: Progressing Vitals remain stable throughout shift. Goal: Nutritional status adequate Outcome: Progressing Patient eating greater than 50% of each meal. Goal: Discharge plan established Outcome: Progressing Discussed with patient and daughter present at the bedside appropriate placement after discharge from hospital.  Discussed inpatient rehab vs. Skilled nursing facility. Goal: Tolerating diet Outcome: Progressing Tolerating diet without difficulty.

## 2011-11-14 NOTE — Progress Notes (Signed)
Physical Therapy Treatment Patient Details Name: Gina Bowers MRN: [id number] DOB: 12/26/1925 Today's Date: 11/14/2011 Time: 7829-5621 PT Time Calculation (min): 23 min  PT Assessment / Plan / Recommendation Comments on Treatment Session  Pt extremely motivated to participate in therapy session. Pt able to ambulate a further distance today, although deficits exaggerated with increased fatigue. Pt still with LLE weakness during functional mobility as well as LUE grip weakness during functional activity. Discussed with family the need for rehab prior to d/c home with 24 hr assist. Family agreeable and pt motivated for great improvements.    Follow Up Recommendations  Inpatient Rehab;Supervision/Assistance - 24 hour    Barriers to Discharge        Equipment Recommendations  Defer to next venue    Recommendations for Other Services Rehab consult  Frequency Min 4X/week   Plan Discharge plan needs to be updated;Frequency remains appropriate    Precautions / Restrictions Precautions Precautions: Fall Restrictions Weight Bearing Restrictions: No   Pertinent Vitals/Pain Follow ambulation on RA, pt sats 95%. RN aware O2 left off.    Mobility  Bed Mobility Bed Mobility: Supine to Sit;Sitting - Scoot to Edge of Bed Supine to Sit: 4: Min assist;HOB elevated;With rails Sitting - Scoot to Edge of Bed: 5: Supervision Details for Bed Mobility Assistance: Min assist through trunk for stability as pt had difficulty bearing weight through LUE into sitting Transfers Transfers: Sit to Stand;Stand to Sit Sit to Stand: 4: Min assist;From bed;With upper extremity assist Stand to Sit: 4: Min assist;With upper extremity assist;To chair/3-in-1 Details for Transfer Assistance: Min assist for stability and controlled ascent/descent. VC for safety to lock/unlock breaks upon sitting and when going to stand. VC for proper sequencing for safety throughout Ambulation/Gait Ambulation/Gait Assistance: 4: Min  assist Ambulation Distance (Feet): 150 Feet Assistive device: Rollator Ambulation/Gait Assistance Details: VC for postural and proper sequencing throughout. Verbal and tactile cues for exaggerated L hip and knee flexion towards end of ambulation as pt with decreased L step length and dragging with fatigue. Gait Pattern: Decreased step length - left;Decreased hip/knee flexion - left;Decreased dorsiflexion - left;Trunk flexed;Wide base of support Gait velocity: decreased gait speed    Exercises General Exercises - Lower Extremity Gluteal Sets: AROM;Strengthening;Both;10 reps Long Arc Quad: AROM;Strengthening;10 reps;Left Hip Flexion/Marching: AROM;Strengthening;Left;10 reps   PT Diagnosis:    PT Problem List:   PT Treatment Interventions:     PT Goals Acute Rehab PT Goals PT Goal Formulation: With patient/family PT Goal: Supine/Side to Sit - Progress: Progressing toward goal PT Goal: Sit to Supine/Side - Progress: Progressing toward goal PT Goal: Sit to Stand - Progress: Progressing toward goal PT Goal: Stand to Sit - Progress: Progressing toward goal PT Transfer Goal: Bed to Chair/Chair to Bed - Progress: Progressing toward goal PT Goal: Ambulate - Progress: Progressing toward goal PT Goal: Perform Home Exercise Program - Progress: Progressing toward goal  Visit Information  Last PT Received On: 11/14/11 Assistance Needed: +1    Subjective Data      Cognition  Overall Cognitive Status: Appears within functional limits for tasks assessed/performed Arousal/Alertness: Awake/alert Orientation Level: Appears intact for tasks assessed Behavior During Session: [facility] for tasks performed    Balance     End of Session PT - End of Session Equipment Utilized During Treatment: Gait belt Activity Tolerance: Patient tolerated treatment well Patient left: in chair;with call bell/phone within reach;with family/visitor present Nurse Communication: Mobility status    Milana Kidney 11/14/2011, 6:06 PM

## 2011-11-14 NOTE — Progress Notes (Signed)
Subjective: Patient on Dr. Manfred Arch. Has been on hospitalist service. Attending has been changed. Doing well this morning. Continues to have left sided facial droop and weakness of left arm and leg.  No difficulty swallowing.  Feels that strength is improving.  Objective: Vital signs in last 24 hours: Temp:  [98.8 F (37.1 C)-98.9 F (37.2 C)] 98.9 F (37.2 C) (06/22 1000) Pulse Rate:  [58-64] 58  (06/22 1000) Resp:  [18] 18  (06/22 1000) BP: (117-138)/(73-76) 138/76 mmHg (06/22 1000) SpO2:  [96 %] 96 % (06/22 1000) Weight change:  Last BM Date: 11/12/11  Intake/Output from previous day:   Intake/Output this shift:    General appearance: alert, cooperative and no distress Resp: clear to auscultation bilaterally Cardio: regular rate and rhythm, S1, S2 normal, no murmur, click, rub or gallop GI: soft, non-tender; bowel sounds normal; no masses,  no organomegaly Extremities: extremities normal, atraumatic, no cyanosis or edema Neurologic: Mental status: Alert, oriented, thought content appropriate, weakness of left face with mild facial droop, left arm strength 4/5 all muscle groups, left leg 4/5 all muscle groups, strength 5/5 on right side, sensation is in tact.  Lab Results:  Basename 11/13/11 0635 11/12/11 2200  WBC 7.6 9.2  HGB 11.2* 11.3*  HCT 33.5* 33.9*  PLT 185 194   BMET  Basename 11/13/11 0635 11/12/11 2200  NA -- 133*  K -- 3.9  CL -- 97  CO2 -- 25  GLUCOSE -- 99  BUN -- 17  CREATININE 0.51 0.57  CALCIUM -- 9.3    Studies/Results: Dg Chest 2 View  11/13/2011  *RADIOLOGY REPORT*  Clinical Data: History of left-sided weakness.  History of stroke. Previous history of hypertension and bypass surgery.  CHEST - 2 VIEW  Comparison: 09/16/2011.  Findings: Cardiac silhouette is normal size and shape. Ectasia and nonaneurysmal calcification of the thoracic aorta are seen. Mediastinal and hilar contours appear stable. The patient has undergone previous median  sternotomy and coronary artery bypass grafting.  No pulmonary edema, pneumonia, or pleural effusion is evident.  There is a stable small calcified granuloma in the lower right lung.  No active granulomatous process is present.  There is osteopenic appearance of the bones.  Changes of degenerative disc disease and degenerative spondylosis are present.  IMPRESSION: No acute cardiopulmonary or pleural abnormalities are evident. Post CABG.  Original Report Authenticated By: Crawford Givens, M.D.   Ct Head Wo Contrast  11/13/2011  *RADIOLOGY REPORT*  Clinical Data: Acute onset left-sided weakness and parasthesias.  CT HEAD WITHOUT CONTRAST  Technique:  Contiguous axial images were obtained from the base of the skull through the vertex without contrast.  Comparison: 05/07/2010  Findings: There is no evidence of intracranial hemorrhage, brain edema or other signs of acute infarction.  There is no evidence of intracranial mass lesion or mass effect.  No abnormal extra-axial fluid collections are identified.  Mild diffuse cerebral atrophy is stable.  Moderate chronic small vessel disease also shows no significant change.  No evidence of hydrocephalus.  No skull abnormality identified.  IMPRESSION:  1.  No acute intracranial abnormality. 2.  Stable cerebral atrophy and chronic small vessel disease.  Original Report Authenticated By: Danae Orleans, M.D.   Mr Brain Wo Contrast  11/13/2011  *RADIOLOGY REPORT*  Clinical Data:  Stroke.  Left-sided weakness.  MRI HEAD WITHOUT CONTRAST MRA HEAD WITHOUT CONTRAST  Technique:  Multiplanar, multiecho pulse sequences of the brain and surrounding structures were obtained without intravenous contrast. Angiographic images of the head  were obtained using MRA technique without contrast.  Comparison:  CT 11/12/2011, MRI 07/30/2007  MRI HEAD  Findings:  10 mm area of restricted diffusion in the right cerebral white matter compatible with acute infarction.  This is located in the deep white  matter on the right.  No other acute infarct is identified.  Generalized atrophy.  Chronic microvascular ischemia in the white matter and pons.  Prominent perivascular spaces in the thalami and basal ganglia and white matter.  Negative for hemorrhage or mass lesion.  IMPRESSION: 10 mm   acute infarct in the deep   cerebral white matter on the right.  Atrophy and chronic microvascular ischemia.  MRA HEAD  Findings: There is diffuse atherosclerotic disease with irregular intracranial vessels.  Distal left vertebral artery is occluded does not contribute to the basilar.  There is disease in the distal right vertebral artery. The left PICA is patent and filled from  retrograde flow to the distal left vertebral artery.  The basilar is diffusely irregular with moderate stenosis.  Posterior cerebral arteries are patent bilaterally.  Internal carotid artery is patent bilaterally.  Atherosclerotic disease is present diffusely with moderate stenosis bilaterally. Anterior and middle cerebral arteries are patent bilaterally with mild intracranial atherosclerotic disease.  No large vessel occlusion.  Negative for aneurysm.  IMPRESSION: Diffuse intracranial atherosclerotic disease.  Occluded left vertebral artery  Original Report Authenticated By: Camelia Phenes, M.D.   Mr Mra Head/brain Wo Cm  11/13/2011  *RADIOLOGY REPORT*  Clinical Data:  Stroke.  Left-sided weakness.  MRI HEAD WITHOUT CONTRAST MRA HEAD WITHOUT CONTRAST  Technique:  Multiplanar, multiecho pulse sequences of the brain and surrounding structures were obtained without intravenous contrast. Angiographic images of the head were obtained using MRA technique without contrast.  Comparison:  CT 11/12/2011, MRI 07/30/2007  MRI HEAD  Findings:  10 mm area of restricted diffusion in the right cerebral white matter compatible with acute infarction.  This is located in the deep white matter on the right.  No other acute infarct is identified.  Generalized atrophy.   Chronic microvascular ischemia in the white matter and pons.  Prominent perivascular spaces in the thalami and basal ganglia and white matter.  Negative for hemorrhage or mass lesion.  IMPRESSION: 10 mm   acute infarct in the deep   cerebral white matter on the right.  Atrophy and chronic microvascular ischemia.  MRA HEAD  Findings: There is diffuse atherosclerotic disease with irregular intracranial vessels.  Distal left vertebral artery is occluded does not contribute to the basilar.  There is disease in the distal right vertebral artery. The left PICA is patent and filled from  retrograde flow to the distal left vertebral artery.  The basilar is diffusely irregular with moderate stenosis.  Posterior cerebral arteries are patent bilaterally.  Internal carotid artery is patent bilaterally.  Atherosclerotic disease is present diffusely with moderate stenosis bilaterally. Anterior and middle cerebral arteries are patent bilaterally with mild intracranial atherosclerotic disease.  No large vessel occlusion.  Negative for aneurysm.  IMPRESSION: Diffuse intracranial atherosclerotic disease.  Occluded left vertebral artery  Original Report Authenticated By: Camelia Phenes, M.D.    Medications:  I have reviewed the patient's current medications. Scheduled:   . aspirin EC  81 mg Oral Daily  . atorvastatin  10 mg Oral q1800  . clopidogrel  75 mg Oral Q breakfast  . darifenacin  7.5 mg Oral Daily  . furosemide  40 mg Oral Daily  . levothyroxine  50 mcg  Oral Q breakfast  . multivitamin with minerals  1 tablet Oral Q breakfast  . nitrofurantoin  100 mg Oral Q breakfast  . pantoprazole  40 mg Oral Q breakfast  . sertraline  75 mg Oral QPM  . DISCONTD: aspirin EC  81 mg Oral Q breakfast  . DISCONTD: clopidogrel  75 mg Oral Q breakfast  . DISCONTD: enoxaparin  30 mg Subcutaneous Q24H   Continuous:  ZOX:[id number] (ZOFRAN) IV, senna-docusate  Assessment/Plan: 1) stroke: new 10mm infarct in deep cerebral  white matter on the right. Neurology has seen and signed off. ECHO unremarkable. MRA with diffuse atherosclerotic disease. Is on ASA, plavix, statin with LDL in 80's. A1C pending. 2) left sided weakness as a result of stroke:  She feels that her strength is improving.  Will continue to work with physical therapy and occupational therapy. 3) hypothyroidism: check TSH 4) episodic shortness of breath: is on tele. Has history of lung disease- possibly interstitial lung disease.  Sats have been fine in hospital. Continue to monitor. Nebs as needed as she does report some wheezing during these episodes. Cardiac enzymes in process.      LOS: 2 days   [facility] [address] 11/14/2011, 10:38 AM

## 2011-11-15 MED ORDER — LEVALBUTEROL TARTRATE 45 MCG/ACT IN AERO
2.0000 | INHALATION_SPRAY | Freq: Three times a day (TID) | RESPIRATORY_TRACT | Status: DC | PRN
  Filled 2011-11-15: qty 15

## 2011-11-15 NOTE — Progress Notes (Signed)
Subjective: Doing well. No new complaints. Feels that she may be slightly stronger than yesterday. Has had a few episodes of shortness of breath and wheezing, mainly on exertion esp after transferring from chair to bed.  Objective: Vital signs in last 24 hours: Temp:  [98 F (36.7 C)-98.9 F (37.2 C)] 98.4 F (36.9 C) (06/22 2234) Pulse Rate:  [58-65] 62  (06/22 2234) Resp:  [18] 18  (06/22 2234) BP: (114-156)/(71-76) 125/75 mmHg (06/22 2234) SpO2:  [91 %-96 %] 91 % (06/22 1800) Weight change:  Last BM Date: 11/14/11  Intake/Output from previous day:   Intake/Output this shift:    General appearance: alert, cooperative and no distress Resp: clear to auscultation bilaterally Cardio: regular rate and rhythm, S1, S2 normal, no murmur, click, rub or gallop GI: soft, non-tender; bowel sounds normal; no masses,  no organomegaly Extremities: extremities normal, atraumatic, no cyanosis or edema Neurologic: Mental status: Alert, oriented, thought content appropriate. Strength over let face, upper and lower extremities 4/5, sensation in tact bilaterally  Lab Results:  Basename 11/13/11 0635 11/12/11 2200  WBC 7.6 9.2  HGB 11.2* 11.3*  HCT 33.5* 33.9*  PLT 185 194   BMET  Basename 11/13/11 0635 11/12/11 2200  NA -- 133*  K -- 3.9  CL -- 97  CO2 -- 25  GLUCOSE -- 99  BUN -- 17  CREATININE 0.51 0.57  CALCIUM -- 9.3    Studies/Results: Dg Chest 2 View  11/13/2011  *RADIOLOGY REPORT*  Clinical Data: History of left-sided weakness.  History of stroke. Previous history of hypertension and bypass surgery.  CHEST - 2 VIEW  Comparison: 09/16/2011.  Findings: Cardiac silhouette is normal size and shape. Ectasia and nonaneurysmal calcification of the thoracic aorta are seen. Mediastinal and hilar contours appear stable. The patient has undergone previous median sternotomy and coronary artery bypass grafting.  No pulmonary edema, pneumonia, or pleural effusion is evident.  There is a  stable small calcified granuloma in the lower right lung.  No active granulomatous process is present.  There is osteopenic appearance of the bones.  Changes of degenerative disc disease and degenerative spondylosis are present.  IMPRESSION: No acute cardiopulmonary or pleural abnormalities are evident. Post CABG.  Original Report Authenticated By: Crawford Givens, M.D.   Mr Brain Wo Contrast  11/13/2011  *RADIOLOGY REPORT*  Clinical Data:  Stroke.  Left-sided weakness.  MRI HEAD WITHOUT CONTRAST MRA HEAD WITHOUT CONTRAST  Technique:  Multiplanar, multiecho pulse sequences of the brain and surrounding structures were obtained without intravenous contrast. Angiographic images of the head were obtained using MRA technique without contrast.  Comparison:  CT 11/12/2011, MRI 07/30/2007  MRI HEAD  Findings:  10 mm area of restricted diffusion in the right cerebral white matter compatible with acute infarction.  This is located in the deep white matter on the right.  No other acute infarct is identified.  Generalized atrophy.  Chronic microvascular ischemia in the white matter and pons.  Prominent perivascular spaces in the thalami and basal ganglia and white matter.  Negative for hemorrhage or mass lesion.  IMPRESSION: 10 mm   acute infarct in the deep   cerebral white matter on the right.  Atrophy and chronic microvascular ischemia.  MRA HEAD  Findings: There is diffuse atherosclerotic disease with irregular intracranial vessels.  Distal left vertebral artery is occluded does not contribute to the basilar.  There is disease in the distal right vertebral artery. The left PICA is patent and filled from  retrograde flow to  the distal left vertebral artery.  The basilar is diffusely irregular with moderate stenosis.  Posterior cerebral arteries are patent bilaterally.  Internal carotid artery is patent bilaterally.  Atherosclerotic disease is present diffusely with moderate stenosis bilaterally. Anterior and middle cerebral  arteries are patent bilaterally with mild intracranial atherosclerotic disease.  No large vessel occlusion.  Negative for aneurysm.  IMPRESSION: Diffuse intracranial atherosclerotic disease.  Occluded left vertebral artery  Original Report Authenticated By: Camelia Phenes, M.D.   Mr Mra Head/brain Wo Cm  11/13/2011  *RADIOLOGY REPORT*  Clinical Data:  Stroke.  Left-sided weakness.  MRI HEAD WITHOUT CONTRAST MRA HEAD WITHOUT CONTRAST  Technique:  Multiplanar, multiecho pulse sequences of the brain and surrounding structures were obtained without intravenous contrast. Angiographic images of the head were obtained using MRA technique without contrast.  Comparison:  CT 11/12/2011, MRI 07/30/2007  MRI HEAD  Findings:  10 mm area of restricted diffusion in the right cerebral white matter compatible with acute infarction.  This is located in the deep white matter on the right.  No other acute infarct is identified.  Generalized atrophy.  Chronic microvascular ischemia in the white matter and pons.  Prominent perivascular spaces in the thalami and basal ganglia and white matter.  Negative for hemorrhage or mass lesion.  IMPRESSION: 10 mm   acute infarct in the deep   cerebral white matter on the right.  Atrophy and chronic microvascular ischemia.  MRA HEAD  Findings: There is diffuse atherosclerotic disease with irregular intracranial vessels.  Distal left vertebral artery is occluded does not contribute to the basilar.  There is disease in the distal right vertebral artery. The left PICA is patent and filled from  retrograde flow to the distal left vertebral artery.  The basilar is diffusely irregular with moderate stenosis.  Posterior cerebral arteries are patent bilaterally.  Internal carotid artery is patent bilaterally.  Atherosclerotic disease is present diffusely with moderate stenosis bilaterally. Anterior and middle cerebral arteries are patent bilaterally with mild intracranial atherosclerotic disease.  No  large vessel occlusion.  Negative for aneurysm.  IMPRESSION: Diffuse intracranial atherosclerotic disease.  Occluded left vertebral artery  Original Report Authenticated By: Camelia Phenes, M.D.    Medications:  I have reviewed the patient's current medications. Scheduled:   . aspirin EC  81 mg Oral Daily  . atorvastatin  10 mg Oral q1800  . clopidogrel  75 mg Oral Q breakfast  . darifenacin  7.5 mg Oral Daily  . furosemide  40 mg Oral Daily  . levothyroxine  50 mcg Oral Q breakfast  . multivitamin with minerals  1 tablet Oral Q breakfast  . nitrofurantoin  100 mg Oral Q breakfast  . pantoprazole  40 mg Oral Q breakfast  . sertraline  75 mg Oral QPM   Continuous:  ZOX:WRUEAVWUJWJ (ZOFRAN) IV, senna-docusate  Assessment/Plan: 1) stroke: new 10mm infarct in deep cerebral white matter on the right. Neurology has seen and signed off. ECHO unremarkable for cause of stroke. MRA with diffuse atherosclerotic disease. Is on ASA, plavix, statin with LDL in 80's. A1C ,6.0. 2) left sided weakness as a result of stroke: She feels that her strength is improving. Will continue to work with physical therapy and occupational therapy. She is very motivated to recover function. Inpatient rehabilitation assessment requested.  3) hypothyroidism: TSH wnl 4) episodic shortness of breath: is on tele with no abnormal events. Has history of lung disease- possibly interstitial lung disease. Sats have been fine in hospital. ECHO does  show grade 1 diastolic dysfunction but she shows no signs of overload on exam or chest xray.  Will write for prn xopenex. 5) dispo: inpatient rehab assessment tomorrow with possible transfer   LOS: 3 days   Joua Bake 11/15/2011, 9:46 AM

## 2011-11-16 DIAGNOSIS — I6330 Cerebral infarction due to thrombosis of unspecified cerebral artery: Secondary | ICD-10-CM

## 2011-11-16 DIAGNOSIS — G8110 Spastic hemiplegia affecting unspecified side: Secondary | ICD-10-CM

## 2011-11-16 LAB — CBC
HCT: 35.5 % — ABNORMAL LOW (ref 36.0–46.0)
Hemoglobin: 11.7 g/dL — ABNORMAL LOW (ref 12.0–15.0)
MCH: 31 pg (ref 26.0–34.0)
MCHC: 33 g/dL (ref 30.0–36.0)
MCV: 93.9 fL (ref 78.0–100.0)

## 2011-11-16 LAB — BASIC METABOLIC PANEL
BUN: 15 mg/dL (ref 6–23)
Creatinine, Ser: 0.55 mg/dL (ref 0.50–1.10)
GFR calc non Af Amer: 84 mL/min — ABNORMAL LOW (ref >90)
Glucose, Bld: 95 mg/dL (ref 70–99)
Potassium: 4.2 mEq/L (ref 3.5–5.1)

## 2011-11-16 MED ORDER — ATORVASTATIN CALCIUM 40 MG PO TABS
40.0000 mg | ORAL_TABLET | Freq: Every day | ORAL | Status: DC
  Administered 2011-11-16: 40 mg via ORAL
  Filled 2011-11-16 (×2): qty 1

## 2011-11-16 NOTE — Progress Notes (Signed)
CSW met with pt's dtr, Dione Plover [phone]), re: d/c plan. Pt admitted from Independent Living level of care at [facility]. Pt's dtr reports hopeful for CIR admission. SNF not appropriate level of care. RNCM aware and following for d/c needs. CSW signing off Dellie Burns, MSW, [place] [phone] (coverage)

## 2011-11-16 NOTE — Progress Notes (Signed)
Physical Therapy Treatment Patient Details Name: Gina Bowers MRN: [id number] DOB: [birth year] Today's Date: 11/16/2011 Time: 4098-1191 PT Time Calculation (min): 21 min  PT Assessment / Plan / Recommendation Comments on Treatment Session  Patient continues to be very motivated to participate in therapy. Patient able to maintain current ambulation distance, requiring assistance for balance and safety with rollator. Patient stated that she normally ambulates two hallways and takes the elevator to the dining hall. I believe patient could become Mod I at the end of a short rehab stay and be able to return to independent living at prior level of function.     Follow Up Recommendations  Inpatient Rehab    Barriers to Discharge        Equipment Recommendations  Defer to next venue    Recommendations for Other Services    Frequency     Plan Discharge plan remains appropriate;Frequency remains appropriate    Precautions / Restrictions Precautions Precautions: Fall   Pertinent Vitals/Pain Denies pain    Mobility  Bed Mobility Supine to Sit: 4: Min guard Sitting - Scoot to Edge of Bed: 4: Min guard Details for Bed Mobility Assistance: Cues for safety. Requires increased efforts.  Transfers Sit to Stand: 4: Min assist;With upper extremity assist;From bed;From chair/3-in-1;With armrests Stand to Sit: 4: Min guard;To chair/3-in-1;With upper extremity assist;With armrests Details for Transfer Assistance: Min A requred for stability and to initiate stand from rollator. Patient required VCs for safety with technique with use of rollator.  Ambulation/Gait Ambulation/Gait Assistance: 4: Min assist Ambulation Distance (Feet): 160 Feet Assistive device: Rollator Ambulation/Gait Assistance Details: Cues for posture and for safest use of RW. Patient with decreased hip/knee flexion as fatiqued set in from ambulation. Patient required assitance to ensure balance and safety with rollator.  Gait  Pattern: Decreased step length - left;Decreased hip/knee flexion - left;Decreased dorsiflexion - left Gait velocity: decreased Modified Rankin (Stroke Patients Only) Pre-Morbid Rankin Score: No significant disability Modified Rankin: Moderately severe disability    Exercises     PT Diagnosis:    PT Problem List:   PT Treatment Interventions:     PT Goals Acute Rehab PT Goals PT Goal: Supine/Side to Sit - Progress: Progressing toward goal PT Goal: Sit to Stand - Progress: Progressing toward goal PT Goal: Stand to Sit - Progress: Progressing toward goal PT Transfer Goal: Bed to Chair/Chair to Bed - Progress: Progressing toward goal PT Goal: Ambulate - Progress: Progressing toward goal  Visit Information  Last PT Received On: 11/16/11 Assistance Needed: +1    Subjective Data      Cognition  Overall Cognitive Status: Appears within functional limits for tasks assessed/performed Arousal/Alertness: Awake/alert Orientation Level: Appears intact for tasks assessed Behavior During Session: [facility] for tasks performed    Balance     End of Session PT - End of Session Equipment Utilized During Treatment: Gait belt Activity Tolerance: Patient tolerated treatment well Patient left: in chair;with call bell/phone within reach Nurse Communication: Mobility status    Fredrich Birks 11/16/2011, 11:21 AM 11/16/2011 Fredrich Birks PTA [phone] pager [phone] office

## 2011-11-16 NOTE — Consult Note (Signed)
Physical Medicine and Rehabilitation Consult Reason for Consult: CVA Referring Physician: Dr. Kirby Funk   HPI: Gina Bowers is a 78 y.o. right-handed female with history of coronary artery disease as well as multiple strokes. Patient is a resident of [facility] independent living. Admitted 11/13/2011 with acute onset left-sided weakness. Per family member she stood up and used her walker to get back to bed when caregiver noted left facial droop and left-sided weakness. She has recovered nicely from history of previous strokes. She had been placed on aspirin/ Plavix. MRI of the brain showed 10 mm acute infarct in the deep cerebral white matter on the right. MRA of the head with occluded left vertebral artery. Echocardiogram with ejection fraction of 60% grade 1 diastolic dysfunction. Carotid Doppler as well as EEG are pending. Neurology services consulted presently patient remains on aspirin and Plavix therapy. Physical therapy evaluation completed. PT/M.D. as requested physical medicine rehabilitation consult to consider inpatient rehabilitation services.    Review of Systems  Genitourinary: Positive for frequency.  Musculoskeletal: Positive for myalgias.  Neurological: Positive for weakness.  Psychiatric/Behavioral: Positive for depression.  All other systems reviewed and are negative.   Past Medical History  Diagnosis Date  . Coronary artery disease     DR. Alanda Amass IS PT'S CARDIOLOGIST - HX CABG AND HAS STENT AND TOLD SHE HAS ANOTHER BLOCKAGE-BEING TREATED MEDICALLY  . Stroke     SEVERAL STROKES -TOLD SHE HAS CEREBELLUM BLOCKAGE AS RESULT OF STROKE--BUT NEUROLOGIST SAID HE DID NOT WANT TO DO ANY PROCEDURE BECAUSE OF HER AGE .  PT HAS SLIGHT LEFT FOOT DROP-DRAGS FOOT WHEN WALKING - USES WALKER  . High cholesterol   . Lung nodules     TOLD SHE HAS INTERSTITUAL LUNG DISEASE  . Shortness of breath     AND WHEEZING AT TIMES--NO INHALERS  . Vertigo   . Arthritis   . GERD  (gastroesophageal reflux disease)   . Urinary incontinence   . UTI (lower urinary tract infection)     HX OF UTI'S-- LAST TIME COUPLE OF MONTHS AGO  . Hypothyroidism   . Blood transfusion 2007    AFTER HIP REPLACEMENT  . Fracture FEB 2013    FRACTURED LEFT ANKLE--NO SURGERY--WAS IN BOOT UNTIL COUPLE WEEKS AGO  . Depression   . Kidney stones     IN THE PAST  . Carotid bruit     PT'S DAUGHTER STATES PT HAD RECENT CAROTID STUDY AND WAS TOLD NO SIGNIFICANT BLOCKAGES  . [place] virus     COUPLE OF WEEKS AGO--ALL SYMTOMS RESOLVED PER PT   Past Surgical History  Procedure Date  . Coronary angioplasty 10/2010    WITH STENT PLACEMENT  . Coronary artery bypass graft 2009  . Joint replacement 2007    HIP REPLACEMENT -LEFT  . Bladder tack     1969 AND 1990  . Cystoscopy with injection 09/21/2011    Procedure: CYSTOSCOPY WITH INJECTION;  Surgeon: Kathi Ludwig, MD;  Location: [facility];  Service: Urology;  Laterality: N/A;  Peri Urethral Macroplastique Injection and Estring Placement   History reviewed. No pertinent family history. Social History:  reports that she has never smoked. She has never used smokeless tobacco. She reports that she does not drink alcohol or use illicit drugs. Allergies:  Allergies  Allergen Reactions  . Sulfonamide Derivatives Hives   Medications Prior to Admission  Medication Sig Dispense Refill  . aspirin EC 81 MG tablet Take 81 mg by mouth daily with breakfast.      .  clopidogrel (PLAVIX) 75 MG tablet Take 75 mg by mouth daily with breakfast.      . fish oil-omega-3 fatty acids 1000 MG capsule Take 1 g by mouth daily with breakfast.      . levothyroxine (SYNTHROID, LEVOTHROID) 50 MCG tablet Take 50 mcg by mouth daily with breakfast.      . Multiple Vitamin (MULITIVITAMIN WITH MINERALS) TABS Take 1 tablet by mouth daily with breakfast.      . nitrofurantoin (MACRODANTIN) 100 MG capsule Take 100 mg by mouth daily with breakfast.      . pantoprazole  (PROTONIX) 40 MG tablet Take 40 mg by mouth daily with breakfast.      . rosuvastatin (CRESTOR) 20 MG tablet Take 20 mg by mouth every evening.       . sertraline (ZOLOFT) 50 MG tablet Take 75 mg by mouth every evening. Patient takes 1 and 1/2 tablets by mouth every day      . trospium (SANCTURA) 20 MG tablet Take 60 mg by mouth daily.      Marland Kitchen DISCONTD: OVER THE COUNTER MEDICATION IMMUNE BOOSTER -OTC        Home: Home Living Lives With: Alone Available Help at Discharge: Personal care attendant (1 hour a day) Type of Home: Apartment Home Access: Level entry;Elevator Home Layout: One level Bathroom Shower/Tub: Walk-in shower;Door [employer] Toilet: Handicapped height Bathroom Accessibility: Yes How Accessible: Accessible via walker Home Adaptive Equipment: Other (comment);Wheelchair - manual;Grab bars around toilet;Grab bars in shower;Bedside commode/3-in-1;Shower chair with back;Reacher (rollator, lift chair)  Functional History: Prior Function Able to Take Stairs?: No Driving: No 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[employer], Investment banker, operational, Therapist, music Functional Status:  Mobility: Bed Mobility Bed Mobility: Supine to Sit;Sitting - Scoot to Edge of Bed Supine to Sit: 4: Min assist;HOB elevated;With rails Sitting - Scoot to Edge of Bed: 5: Supervision Sit to Supine: 5: Supervision;HOB flat Transfers Transfers: Sit to Stand;Stand to Sit Sit to Stand: 4: Min assist;From bed;With upper extremity assist Stand to Sit: 4: Min assist;With upper extremity assist;To chair/3-in-1 Ambulation/Gait Ambulation/Gait Assistance: 4: Min assist Ambulation Distance (Feet): 150 Feet Assistive device: Rollator Ambulation/Gait Assistance Details: VC for postural and proper sequencing throughout. Verbal and tactile cues for exaggerated L hip and knee flexion towards end of ambulation as pt with decreased L step length and dragging with fatigue. Gait Pattern: Decreased step length - left;Decreased hip/knee flexion -  left;Decreased dorsiflexion - left;Trunk flexed;Wide base of support Gait velocity: decreased gait speed    ADL: ADL Upper Body Dressing: Performed;Minimal assistance Where Assessed - Upper Body Dressing: Unsupported sitting Toilet Transfer: Simulated;Minimal assistance Toilet Transfer Method:  (ambulating) Toilet Transfer Equipment: Other (comment) (bed) Equipment Used: Gait belt (rollator) Transfers/Ambulation Related to ADLs: min assist with rollator due to left side weakness. minimal left foot drop due to old stroke. ADL Comments: Pt "wheezing" while supine in bed, but wheezing decreased during functional mobility.  Granddaughter reports this has been occuring recently PTA. Pt is near baseline level, however requiring min assist due to decreased L UE/LE strength/sensation.  Pt reports diffiulty holding ADL items (feeding utensils, toilet paper) with left hand.  While ambulating with rollator, pt demonstrated decreased proprioception when unaware of L hand placment until cued by therapist.  Cognition: Cognition Overall Cognitive Status: Appears within functional limits for tasks assessed Arousal/Alertness: Awake/alert Orientation Level: Oriented X4 Attention: Selective Memory: Appears intact Awareness: Appears intact Problem Solving: Appears intact Safety/Judgment: Appears intact Cognition Overall Cognitive Status: Appears within functional limits for tasks assessed/performed Arousal/Alertness: Awake/alert Orientation  Level: Appears intact for tasks assessed Behavior During Session: [facility] for tasks performed  Blood pressure 123/75, pulse 64, temperature 98.9 F (37.2 C), temperature source Oral, resp. rate 20, weight 74 kg (163 lb 2.3 oz), SpO2 95.00%. Physical Exam  Constitutional: She is oriented to person, place, and time.  HENT:  Head: Normocephalic.  Neck: Neck supple. No thyromegaly present.  Cardiovascular: Regular rhythm.   Pulmonary/Chest: Breath sounds normal. She  has no wheezes.  Abdominal: She exhibits no distension. There is no tenderness.  Neurological: She is alert and oriented to person, place, and time.       Mild left facial droop. Speech is mildly dysarthric but fully intelligible. She follows commands nicely. She names person place and date of birth  Skin: Skin is warm and dry.  Psychiatric: She has a normal mood and affect.  strength is 3/5 in the left deltoid, biceps, triceps, grip, 4/5 in the left hip flexor knee extensor ankle dorsiflexor and plantar flexor 5/5 on the right side Sensation is intact to light touch in both upper and both lower extremities No evidence of aphasia  Results for orders placed during the hospital encounter of 11/12/11 (from the past 24 hour(s))  CBC     Status: Abnormal   Collection Time   11/16/11  6:04 AM      Component Value Range   WBC 7.3  4.0 - 10.5 K/uL   RBC 3.78 (*) 3.87 - 5.11 MIL/uL   Hemoglobin 11.7 (*) 12.0 - 15.0 g/dL   HCT 16.1 (*) 09.6 - 04.5 %   MCV 93.9  78.0 - 100.0 fL   MCH 31.0  26.0 - 34.0 pg   MCHC 33.0  30.0 - 36.0 g/dL   RDW 40.9  81.1 - 91.4 %   Platelets 198  150 - 400 K/uL  BASIC METABOLIC PANEL     Status: Abnormal   Collection Time   11/16/11  6:04 AM      Component Value Range   Sodium 138  135 - 145 mEq/L   Potassium 4.2  3.5 - 5.1 mEq/L   Chloride 101  96 - 112 mEq/L   CO2 28  19 - 32 mEq/L   Glucose, Bld 95  70 - 99 mg/dL   BUN 15  6 - 23 mg/dL   Creatinine, Ser 7.82  0.50 - 1.10 mg/dL   Calcium 9.1  8.4 - 95.6 mg/dL   GFR calc non Af Amer 84 (*) >90 mL/min   GFR calc Af Amer >90  >90 mL/min   No results found.  Assessment/Plan: Diagnosis: right subcortical infarct with left hemiparesis upper extremity greater than lower extremity 1. Does the need for close, 24 hr/day medical supervision in concert with the patient's rehab needs make it unreasonable for this patient to be served in a less intensive setting? Yes 2. Co-Morbidities requiring supervision/potential  complications: prior stroke, anxiety,spastic bladder 3. Due to bladder management, bowel management, safety, skin/wound care, disease management, medication administration and patient education, does the patient require 24 hr/day rehab nursing? Yes 4. Does the patient require coordinated care of a physician, rehab nurse, PT (1-2 hrs/day, 5 days/week), OT (1-2 hrs/day, 5 days/week) and SLP (0.5-1 hrs/day, 3 days/week) to address physical and functional deficits in the context of the above medical diagnosis(es)? Yes Addressing deficits in the following areas: balance, endurance, locomotion, strength, transferring, bowel/bladder control, bathing, dressing and toileting 5. Can the patient actively participate in an intensive therapy program of at least 3  hrs of therapy per day at least 5 days per week? Yes 6. The potential for patient to make measurable gains while on inpatient rehab is excellent 7. Anticipated functional outcomes upon discharge from inpatient rehab are modified independent mobility with PT, outside independent ADLs, supervision with complex ADLs with OT, independent checkbook management with SLP. 8. Estimated rehab length of stay to reach the above functional goals is: 7-10 days 9. Does the patient have adequate social supports to accommodate these discharge functional goals? Yes 10. Anticipated D/C setting: Home 11. Anticipated post D/C treatments: HH therapy 12. Overall Rehab/Functional Prognosis: excellent  RECOMMENDATIONS: This patient's condition is appropriate for continued rehabilitative care in the following setting: CIR Patient has agreed to participate in recommended program. Yes Note that insurance prior authorization may be required for reimbursement for recommended care.  Comment:    11/16/2011

## 2011-11-16 NOTE — Progress Notes (Signed)
Subjective: No new complaints  Objective: Vital signs in last 24 hours: Temp:  [97.4 F (36.3 C)-98.9 F (37.2 C)] 98.9 F (37.2 C) (06/23 2154) Pulse Rate:  [58-64] 64  (06/23 2154) Resp:  [18-20] 20  (06/23 2154) BP: (106-154)/(63-82) 123/75 mmHg (06/23 2154) SpO2:  [90 %-95 %] 95 % (06/23 2154) Weight change:  Last BM Date: 11/15/11  Intake/Output from previous day:   Intake/Output this shift:    General appearance: alert and cooperative Resp: clear to auscultation bilaterally Cardio: regular rate and rhythm, S1, S2 normal, no murmur, click, rub or gallop GI: soft, non-tender; bowel sounds normal; no masses,  no organomegaly Extremities: extremities normal, atraumatic, no cyanosis or edema  Lab Results:  [facility] 11/16/11 0604  WBC 7.3  HGB 11.7*  HCT 35.5*  PLT 198   BMET  Basename 11/16/11 0604  NA 138  K 4.2  CL 101  CO2 28  GLUCOSE 95  BUN 15  CREATININE 0.55  CALCIUM 9.1    Studies/Results: No results found.  Medications: I have reviewed the patient's current medications.  Assessment/Plan: Active Problems:  STROKE improved, hope to transfer to inpt rehab  Hypoxemia/Dyspnea  Long standing problem, pulmonary workup several yrs ago, CXR negative.  May need further evaluation as outpatient  CAD stable     LOS: 4 days   Gina Bowers Gina Bowers 11/16/2011, 7:47 AM

## 2011-11-16 NOTE — Progress Notes (Signed)
Occupational Therapy Treatment Patient Details Name: Gina Bowers MRN: [id number] DOB: 03-12-1926 Today's Date: 11/16/2011 Time: 4098-1191 OT Time Calculation (min): 38 min  OT Assessment / Plan / Recommendation Comments on Treatment Session Pt. progressing well.  Demonstrates increased use of Lt. UE.  Pt. with impaired balance - requires min A for standing and sitting balance, and min A for ADLs.  Recommend CIR    Follow Up Recommendations  Inpatient Rehab;Supervision/Assistance - 24 hour    Barriers to Discharge       Equipment Recommendations  Defer to next venue    Recommendations for Other Services Rehab consult  Frequency Min 2X/week   Plan Discharge plan needs to be updated    Precautions / Restrictions Precautions Precautions: Fall Restrictions Weight Bearing Restrictions: No   Pertinent Vitals/Pain     ADL  Grooming: Performed;Wash/dry hands;Teeth care;Minimal assistance Where Assessed - Grooming: Supported standing Upper Body Dressing: Performed;Minimal assistance Where Assessed - Upper Body Dressing: Supported sitting Lower Body Dressing: Performed;Minimal assistance Where Assessed - Lower Body Dressing: Unsupported sitting Toilet Transfer: Performed;Minimal assistance Toilet Transfer Method: Sit to stand Toilet Transfer Equipment: Raised toilet seat with arms (or 3-in-1 over toilet) Toileting - Clothing Manipulation and Hygiene: Performed;Minimal assistance Where Assessed - Glass blower/designer Manipulation and Hygiene: Sit to stand from 3-in-1 or toilet Equipment Used: Rolling walker Transfers/Ambulation Related to ADLs: min a with rollator ADL Comments: Pt. required min A for dynamic sitting balance EOB.  Pt. performed reaching activities with Lt. UE.  Able to reach for cup and drink from it with min facilitation.  Performed [facility] activities with focus on reciprocal movements of hand    OT Diagnosis:    OT Problem List:   OT Treatment Interventions:     OT  Goals ADL Goals ADL Goal: Upper Body Dressing - Progress: Progressing toward goals ADL Goal: Lower Body Dressing - Progress: Progressing toward goals ADL Goal: Toilet Transfer - Progress: Progressing toward goals Arm Goals Arm Goal: Additional Goal #1 - Progress: Progressing toward goals  Visit Information  Last OT Received On: 11/16/11 Assistance Needed: +1    Subjective Data      Prior Functioning       Cognition  Overall Cognitive Status: Appears within functional limits for tasks assessed/performed Arousal/Alertness: Awake/alert Orientation Level: Appears intact for tasks assessed Behavior During Session: [facility]  for tasks performed    Mobility Bed Mobility Bed Mobility: Supine to Sit;Sitting - Scoot to Edge of Bed Supine to Sit: 4: Min guard;With rails;HOB elevated (30*) Sitting - Scoot to Edge of Bed: 4: Min guard Transfers Transfers: Sit to Stand;Stand to Sit Sit to Stand: 4: Min assist;With upper extremity assist;From bed;From chair/3-in-1;With armrests Stand to Sit: 4: Min guard;To chair/3-in-1;With upper extremity assist;With armrests Details for Transfer Assistance: Min A requred for stability and to initiate stand from rollator. Patient required VCs for safety with technique with use of rollator.    Exercises    Balance Dynamic Standing Balance Dynamic Standing - Level of Assistance: 4: Min assist  End of Session OT - End of Session Activity Tolerance: Patient tolerated treatment well Patient left: in chair;with call bell/phone within reach;with family/visitor present   Jayle Solarz, Ursula Alert M 11/16/2011, 4:28 PM

## 2011-11-16 NOTE — Progress Notes (Signed)
Per rehab MD consult, patient would be excellent CIR candidate. Discussed with family and patient at bedside. Patient is extremely motivated to return home as independent as possible. Will f/u in am. Toni Amend adm coordinator [phone].

## 2011-11-17 ENCOUNTER — Inpatient Hospital Stay (HOSPITAL_COMMUNITY)
Admission: RE | Admit: 2011-11-17 | Discharge: 2011-11-24 | DRG: 945 | Disposition: A | Discharge status: Home Health | Payer: Medicare Other | Source: Ambulatory Visit | Attending: Physical Medicine & Rehabilitation | Admitting: Physical Medicine & Rehabilitation

## 2011-11-17 DIAGNOSIS — I2510 Atherosclerotic heart disease of native coronary artery without angina pectoris: Secondary | ICD-10-CM

## 2011-11-17 DIAGNOSIS — I6330 Cerebral infarction due to thrombosis of unspecified cerebral artery: Secondary | ICD-10-CM

## 2011-11-17 DIAGNOSIS — Z9861 Coronary angioplasty status: Secondary | ICD-10-CM

## 2011-11-17 DIAGNOSIS — F3289 Other specified depressive episodes: Secondary | ICD-10-CM

## 2011-11-17 DIAGNOSIS — Z7982 Long term (current) use of aspirin: Secondary | ICD-10-CM | POA: Diagnosis not present

## 2011-11-17 DIAGNOSIS — I6509 Occlusion and stenosis of unspecified vertebral artery: Secondary | ICD-10-CM | POA: Diagnosis not present

## 2011-11-17 DIAGNOSIS — Z96649 Presence of unspecified artificial hip joint: Secondary | ICD-10-CM | POA: Diagnosis not present

## 2011-11-17 DIAGNOSIS — R32 Unspecified urinary incontinence: Secondary | ICD-10-CM

## 2011-11-17 DIAGNOSIS — Z87440 Personal history of urinary (tract) infections: Secondary | ICD-10-CM | POA: Diagnosis not present

## 2011-11-17 DIAGNOSIS — R29810 Facial weakness: Secondary | ICD-10-CM | POA: Diagnosis not present

## 2011-11-17 DIAGNOSIS — Z8673 Personal history of transient ischemic attack (TIA), and cerebral infarction without residual deficits: Secondary | ICD-10-CM

## 2011-11-17 DIAGNOSIS — Z951 Presence of aortocoronary bypass graft: Secondary | ICD-10-CM | POA: Diagnosis not present

## 2011-11-17 DIAGNOSIS — I672 Cerebral atherosclerosis: Secondary | ICD-10-CM

## 2011-11-17 DIAGNOSIS — K219 Gastro-esophageal reflux disease without esophagitis: Secondary | ICD-10-CM | POA: Diagnosis not present

## 2011-11-17 DIAGNOSIS — E039 Hypothyroidism, unspecified: Secondary | ICD-10-CM | POA: Diagnosis not present

## 2011-11-17 DIAGNOSIS — Z5189 Encounter for other specified aftercare: Secondary | ICD-10-CM

## 2011-11-17 DIAGNOSIS — M129 Arthropathy, unspecified: Secondary | ICD-10-CM | POA: Diagnosis not present

## 2011-11-17 DIAGNOSIS — G8190 Hemiplegia, unspecified affecting unspecified side: Secondary | ICD-10-CM

## 2011-11-17 DIAGNOSIS — E7800 Pure hypercholesterolemia, unspecified: Secondary | ICD-10-CM

## 2011-11-17 DIAGNOSIS — F329 Major depressive disorder, single episode, unspecified: Secondary | ICD-10-CM

## 2011-11-17 DIAGNOSIS — I639 Cerebral infarction, unspecified: Secondary | ICD-10-CM

## 2011-11-17 DIAGNOSIS — Z7902 Long term (current) use of antithrombotics/antiplatelets: Secondary | ICD-10-CM

## 2011-11-17 DIAGNOSIS — Z79899 Other long term (current) drug therapy: Secondary | ICD-10-CM | POA: Diagnosis not present

## 2011-11-17 MED ORDER — ASPIRIN EC 81 MG PO TBEC
81.0000 mg | DELAYED_RELEASE_TABLET | Freq: Every day | ORAL | Status: DC
  Administered 2011-11-18 – 2011-11-24 (×7): 81 mg via ORAL
  Filled 2011-11-17 (×8): qty 1

## 2011-11-17 MED ORDER — ATORVASTATIN CALCIUM 40 MG PO TABS
40.0000 mg | ORAL_TABLET | Freq: Every day | ORAL | Status: DC
  Administered 2011-11-17 – 2011-11-23 (×7): 40 mg via ORAL
  Filled 2011-11-17 (×8): qty 1

## 2011-11-17 MED ORDER — SERTRALINE HCL 50 MG PO TABS
75.0000 mg | ORAL_TABLET | Freq: Every evening | ORAL | Status: DC
  Administered 2011-11-17 – 2011-11-23 (×7): 75 mg via ORAL
  Filled 2011-11-17 (×8): qty 1

## 2011-11-17 MED ORDER — POLYETHYLENE GLYCOL 3350 17 G PO PACK
17.0000 g | PACK | Freq: Every day | ORAL | Status: DC | PRN
  Administered 2011-11-21: 17 g via ORAL
  Filled 2011-11-17: qty 1

## 2011-11-17 MED ORDER — LEVALBUTEROL TARTRATE 45 MCG/ACT IN AERO
2.0000 | INHALATION_SPRAY | Freq: Three times a day (TID) | RESPIRATORY_TRACT | Status: DC | PRN
  Filled 2011-11-17: qty 15

## 2011-11-17 MED ORDER — ONDANSETRON HCL 4 MG PO TABS: 4.0000 mg | ORAL_TABLET | Freq: Four times a day (QID) | ORAL | Status: DC | PRN

## 2011-11-17 MED ORDER — ONDANSETRON HCL 4 MG/2ML IJ SOLN: 4.0000 mg | Freq: Four times a day (QID) | INTRAMUSCULAR | Status: DC | PRN

## 2011-11-17 MED ORDER — CLOPIDOGREL BISULFATE 75 MG PO TABS
75.0000 mg | ORAL_TABLET | Freq: Every day | ORAL | Status: DC
  Administered 2011-11-18 – 2011-11-24 (×7): 75 mg via ORAL
  Filled 2011-11-17 (×8): qty 1

## 2011-11-17 MED ORDER — ADULT MULTIVITAMIN W/MINERALS CH
1.0000 | ORAL_TABLET | Freq: Every day | ORAL | Status: DC
  Administered 2011-11-18 – 2011-11-24 (×7): 1 via ORAL
  Filled 2011-11-17 (×8): qty 1

## 2011-11-17 MED ORDER — DARIFENACIN HYDROBROMIDE ER 7.5 MG PO TB24
7.5000 mg | ORAL_TABLET | Freq: Every day | ORAL | Status: DC
  Administered 2011-11-18 – 2011-11-24 (×7): 7.5 mg via ORAL
  Filled 2011-11-17 (×8): qty 1

## 2011-11-17 MED ORDER — SORBITOL 70 % SOLN
30.0000 mL | Freq: Every day | Status: DC | PRN
  Filled 2011-11-17: qty 30

## 2011-11-17 MED ORDER — PANTOPRAZOLE SODIUM 40 MG PO TBEC
40.0000 mg | DELAYED_RELEASE_TABLET | Freq: Every day | ORAL | Status: DC
  Administered 2011-11-18 – 2011-11-24 (×7): 40 mg via ORAL
  Filled 2011-11-17 (×7): qty 1

## 2011-11-17 MED ORDER — NITROFURANTOIN MACROCRYSTAL 100 MG PO CAPS
100.0000 mg | ORAL_CAPSULE | Freq: Every day | ORAL | Status: DC
  Administered 2011-11-18 – 2011-11-24 (×7): 100 mg via ORAL
  Filled 2011-11-17 (×8): qty 1

## 2011-11-17 MED ORDER — ACETAMINOPHEN 325 MG PO TABS: 325.0000 mg | ORAL_TABLET | ORAL | Status: DC | PRN

## 2011-11-17 MED ORDER — LEVOTHYROXINE SODIUM 50 MCG PO TABS
50.0000 ug | ORAL_TABLET | Freq: Every day | ORAL | Status: DC
  Administered 2011-11-18 – 2011-11-24 (×7): 50 ug via ORAL
  Filled 2011-11-17 (×8): qty 1

## 2011-11-17 NOTE — Plan of Care (Signed)
Problem: RH BOWEL ELIMINATION Goal: RH STG MANAGE BOWEL WITH ASSISTANCE STG Manage Bowel with Assistance.  Mod I

## 2011-11-17 NOTE — Plan of Care (Signed)
Overall Plan of Care [facility]) Patient Details Name: Gina Bowers MRN: [id number] DOB: 08-26-1925  Diagnosis:  Rehab for R deep frontal white matter infarct  Primary Diagnosis:    CVA (cerebral vascular accident) Co-morbidities: L hemiparesis, poor balance, HTN  Functional Problem List  Patient demonstrates impairments in the following areas: Bladder and Bowel  Basic ADL's: eating, grooming, bathing, dressing and toileting Advanced ADL's:   Transfers:  bed mobility, bed to chair, car and furniture Locomotion:  ambulation and stairs  Additional Impairments:  Functional use of upper extremity  Anticipated Outcomes Item Anticipated Outcome  Eating/Swallowing    Basic self-care  Modified independent  Tolieting  Modified independent  Bowel/Bladder  Mod I   Transfers  Mod I  Locomotion  Mod I 150' with rollator  Communication    Cognition    Pain    Safety/Judgment    Other     Therapy Plan: PT Frequency: 2-3 X/day, 60-90 minutesOT 1-2 times per day 5/7 days for 60 to 90 mins OT Frequency: 1-2 X/day, 60-90 minutes     Team Interventions: Item RN PT OT SLP SW TR Other  Self Care/Advanced ADL Retraining   x      Neuromuscular Re-Education  x x      Therapeutic Activities  x x      UE/LE Strength Training/ROM  x x      UE/LE Coordination Activities  x x      Visual/Perceptual Remediation/Compensation         xDME/Adaptive Equipment Instruction  x x      Therapeutic Exercise  x x      Balance/Vestibular Training  x x      Patient/Family Education  x x      Cognitive Remediation/Compensation         Functional Mobility Training  x x      Ambulation/Gait Training  x       Furniture conservator/restorer Reintegration  x x      Dysphagia/Aspiration Film/video editor         Bladder Management x        Bowel Management x        Disease  Management/Prevention x        Pain Management x  x      Medication Management x        Skin Care/Wound Management         Splinting/Orthotics   x      Discharge Planning  x x  x    Psychosocial Support   x  x       x                Team Discharge Planning: Destination:  Assisted Living Projected Follow-up:  PT, OT and Home Health Projected Equipment Needs:  None for OT Patient/family involved in discharge planning:  Yes  MD ELOS: 10 days Medical Rehab Prognosis:  Good Assessment: 78 yo female living independantly with onset of L hemiparesis due to CVA now requiring 24/7 rehab RN , MD, CIR level OT,PT

## 2011-11-17 NOTE — PMR Pre-admission (Signed)
PMR Admission Coordinator Pre-Admission Assessment  Patient: Gina Bowers is an 78 y.o., female MRN: [id number] DOB: 05-20-1926 Height: 5\' 1"  (154.9 cm) Weight: 74 kg (163 lb 2.3 oz)  Insurance Information HMO:     PPO:      PCP:      IPA:      80/20:      OTHER: x PRIMARY: Medicare      Policy#: [id number]      Subscriber: patient CM Name:       Phone#:      Fax#:  Pre-Cert#:       Employer: retired Benefits:  Phone #: [employer]     Name:  Eff. Date: 07/23/92 A and B     Deduct: $1184      Out of Pocket Max:       Life Max:  CIR: 100%      SNF: LBD=11/16/10; 100 days Outpatient: 80%     Co-Pay: 20% Home Health: 100%      Co-Pay: 0 DME: 80%     Co-Pay: 20% Providers: patient's choice SECONDARY: [employer]      Policy#: [id number]      Subscriber: patient CM Name:       Phone#:      Fax#:  Pre-Cert#:       Employer:  Benefits:  Phone #:      Name:  Eff. Date:      Deduct:       Out of Pocket Max:       Life Max:  CIR:       SNF:  Outpatient:      Co-Pay:  Home Health:       Co-Pay:  DME:      Co-Pay:   Medicaid Application Date:       Case Manager:  Disability Application Date:       Case Worker:   Emergency Contact Information Contact Information    Name Relation Home Work [place] Daughter [phone]  [phone]     Current Medical History  Patient Admitting Diagnosis:right subcortical infarct with left hemiparesis upper extremity greater than lower extremity   History of Present Illness: Gina Bowers is a 78 y.o. right-handed female with history of coronary artery disease as well as multiple strokes. Patient is a resident of [facility] independent living. Admitted 11/13/2011 with acute onset left-sided weakness. Per family member she stood up and used her walker to get back to bed when caregiver noted left facial droop and left-sided weakness. She has recovered nicely from history of previous strokes. She had been placed on aspirin/ Plavix. MRI of the  brain showed 10 mm acute infarct in the deep cerebral white matter on the right. MRA of the head with diffuse intracranial atherosclerotic disease as well as occluded left vertebral artery. Echocardiogram with ejection fraction of 60% grade 1 diastolic dysfunction. Carotid Doppler with no ICA stenosis. Neurology services consulted presently patient remains on aspirin and Plavix therapy. Physical therapy evaluation completed. PT/M.D. as requested physical medicine rehabilitation consult to consider inpatient rehabilitation services. Patient was felt to be a good candidate for inpatient rehabilitation and was admitted for comprehensive rehabilitation program  Total: 1     Past Medical History  Past Medical History  Diagnosis Date  . Coronary artery disease     DR. Alanda Amass IS PT'S CARDIOLOGIST - HX CABG AND HAS STENT AND TOLD SHE HAS ANOTHER BLOCKAGE-BEING TREATED MEDICALLY  . Stroke     SEVERAL  STROKES -TOLD SHE HAS CEREBELLUM BLOCKAGE AS RESULT OF STROKE--BUT NEUROLOGIST SAID HE DID NOT WANT TO DO ANY PROCEDURE BECAUSE OF HER AGE .  PT HAS SLIGHT LEFT FOOT DROP-DRAGS FOOT WHEN WALKING - USES WALKER  . High cholesterol   . Lung nodules     TOLD SHE HAS INTERSTITUAL LUNG DISEASE  . Shortness of breath     AND WHEEZING AT TIMES--NO INHALERS  . Vertigo   . Arthritis   . GERD (gastroesophageal reflux disease)   . Urinary incontinence   . UTI (lower urinary tract infection)     HX OF UTI'S-- LAST TIME COUPLE OF MONTHS AGO  . Hypothyroidism   . Blood transfusion 2007    AFTER HIP REPLACEMENT  . Fracture FEB 2013    FRACTURED LEFT ANKLE--NO SURGERY--WAS IN BOOT UNTIL COUPLE WEEKS AGO  . Depression   . Kidney stones     IN THE PAST  . Carotid bruit     PT'S DAUGHTER STATES PT HAD RECENT CAROTID STUDY AND WAS TOLD NO SIGNIFICANT BLOCKAGES  . [place] virus     COUPLE OF WEEKS AGO--ALL SYMTOMS RESOLVED PER PT    Family History  family history is not on file.  Prior  Rehab/Hospitalizations: no prior CIR stays   Current Medications  Current facility-administered medications:aspirin EC tablet 81 mg, 81 mg, Oral, Daily, Richarda Overlie, MD, 81 mg at 11/16/11 1039;  atorvastatin (LIPITOR) tablet 40 mg, 40 mg, Oral, q1800, Lillia Mountain, MD, 40 mg at 11/16/11 1806;  clopidogrel (PLAVIX) tablet 75 mg, 75 mg, Oral, Q breakfast, Richarda Overlie, MD, 75 mg at 11/16/11 1610;  darifenacin (ENABLEX) 24 hr tablet 7.5 mg, 7.5 mg, Oral, Daily, Rometta Emery, MD, 7.5 mg at 11/16/11 1040 levalbuterol (XOPENEX HFA) inhaler 2 puff, 2 puff, Inhalation, Q8H PRN, Elby Showers, MD;  levothyroxine (SYNTHROID, LEVOTHROID) tablet 50 mcg, 50 mcg, Oral, Q breakfast, Rometta Emery, MD, 50 mcg at 11/16/11 1040;  multivitamin with minerals tablet 1 tablet, 1 tablet, Oral, Q breakfast, Rometta Emery, MD, 1 tablet at 11/16/11 1040 nitrofurantoin (MACRODANTIN) capsule 100 mg, 100 mg, Oral, Q breakfast, Rometta Emery, MD, 100 mg at 11/16/11 1039;  ondansetron (ZOFRAN) injection 4 mg, 4 mg, Intravenous, Q6H PRN, Rometta Emery, MD;  pantoprazole (PROTONIX) EC tablet 40 mg, 40 mg, Oral, Q breakfast, Rometta Emery, MD, 40 mg at 11/16/11 1041;  senna-docusate (Senokot-S) tablet 1 tablet, 1 tablet, Oral, QHS PRN, Rometta Emery, MD sertraline (ZOLOFT) tablet 75 mg, 75 mg, Oral, QPM, Rometta Emery, MD, 75 mg at 11/16/11 1806  Patients Current Diet: Cardiac  Precautions / Restrictions Precautions Precautions: Fall Restrictions Weight Bearing Restrictions: No   Prior Activity Level Limited Community (1-2x/wk): lived in Independent living [facility]; no driving, but active Journalist, newspaper / Equipment Home Assistive Devices/Equipment: Environmental consultant (specify type) (Rolling) Home Adaptive Equipment: Wheelchair - manual;Grab bars around toilet;Grab bars in shower;Bedside commode/3-in-1;Reacher;Shower chair with back;Walker - rolling  Prior Functional Level Prior Function Level of  Independence: Independent with assistive device(s) ([place] does cleaning, meal prep, etc) Able to Take Stairs?: No Driving: No 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[employer], Investment banker, operational, Therapist, music  Current Functional Level Cognition  Arousal/Alertness: Awake/alert Overall Cognitive Status: Appears within functional limits for tasks assessed Overall Cognitive Status: Appears within functional limits for tasks assessed/performed Orientation Level: Oriented X4 Attention: Selective Memory: Appears intact Awareness: Appears intact Problem Solving: Appears intact Safety/Judgment: Appears intact    Extremity Assessment (includes Sensation/Coordination)  RUE ROM/Strength/Tone: [facility]  for tasks assessed (3+/5 throughout) RUE Sensation: WFL - Light Touch;WFL - Proprioception RUE Coordination: WFL - gross/fine motor  RLE ROM/Strength/Tone: Within functional levels RLE Sensation: WFL - Light Touch    ADLs  Grooming: Performed;Wash/dry hands;Teeth care;Minimal assistance Where Assessed - Grooming: Supported standing Upper Body Dressing: Performed;Minimal assistance Where Assessed - Upper Body Dressing: Supported sitting Lower Body Dressing: Performed;Minimal assistance Where Assessed - Lower Body Dressing: Unsupported sitting Toilet Transfer: Performed;Minimal assistance Toilet Transfer Method: Sit to stand Toilet Transfer Equipment: Raised toilet seat with arms (or 3-in-1 over toilet) Toileting - Clothing Manipulation and Hygiene: Performed;Minimal assistance Where Assessed - Glass blower/designer Manipulation and Hygiene: Sit to stand from 3-in-1 or toilet Equipment Used: Rolling walker Transfers/Ambulation Related to ADLs: min a with rollator ADL Comments: Pt. required min A for dynamic sitting balance EOB.  Pt. performed reaching activities with Lt. UE.  Able to reach for cup and drink from it with min facilitation.  Performed [facility] activities with focus on reciprocal movements of hand    Mobility   Bed Mobility: Supine to Sit;Sitting - Scoot to Edge of Bed Supine to Sit: 4: Min guard;With rails;HOB elevated (30*) Sitting - Scoot to Edge of Bed: 4: Min guard Sit to Supine: 5: Supervision;HOB flat    Transfers  Transfers: Sit to Stand;Stand to Sit Sit to Stand: 4: Min assist;With upper extremity assist;From bed;From chair/3-in-1;With armrests Stand to Sit: 4: Min guard;To chair/3-in-1;With upper extremity assist;With armrests    Ambulation / Gait / Stairs / Wheelchair Mobility  Ambulation/Gait Ambulation/Gait Assistance: 4: Min Environmental consultant (Feet): 160 Feet Assistive device: Rollator Ambulation/Gait Assistance Details: Cues for posture and for safest use of RW. Patient with decreased hip/knee flexion as fatiqued set in from ambulation. Patient required assitance to ensure balance and safety with rollator.  Gait Pattern: Decreased step length - left;Decreased hip/knee flexion - left;Decreased dorsiflexion - left Gait velocity: decreased    Posture / Balance Dynamic Standing Balance Dynamic Standing - Level of Assistance: 4: Min assist     Previous Home Environment Living Arrangements: Alone (in Independent LIving facility) Lives With: Alone Available Help at Discharge: Personal care attendant;Family Type of Home: Apartment Care Facility Name: [facility] Home Layout: One level Home Access: Level entry;Elevator Bathroom Shower/Tub: Walk-in shower;Door [employer] Toilet: Handicapped height Bathroom Accessibility: Yes How Accessible: Accessible via walker Home Care Services: No  Discharge Living Setting Plans for Discharge Living Setting: Alone;Apartment ([facility]) Type of Home at Discharge: Independent living facility Care Facility Name at Discharge: [facility] Discharge Home Layout: One level Discharge Home Access: Level entry;Elevator Discharge Bathroom Shower/Tub: Walk-in shower;Door Discharge Bathroom  Toilet: Handicapped height Discharge Bathroom Accessibility: Yes How Accessible: Accessible via walker Do you have any problems obtaining your medications?: No  Social/Family/Support Systems Patient Roles: Parent Contact Information: home [phone] Anticipated Caregiver: dtr and staff at [facility]; also hired personal care attendant to check on during day Anticipated Caregiver's Contact Information: [phone] Eber Jones Ability/Limitations of Caregiver: can provide assistance Caregiver Availability: 24/7 Discharge Plan Discussed with Primary Caregiver: Yes Is Caregiver In Agreement with Plan?: Yes Does Caregiver/Family have Issues with Lodging/Transportation while Pt is in Rehab?: No  Goals/Additional Needs Patient/Family Goal for Rehab: Mod I PT; S-OT; SLP-I Expected length of stay: 7-10 days Cultural Considerations: none Dietary Needs: Regular Equipment Needs: to be determined Pt/Family Agrees to Admission and willing to participate: Yes Program Orientation Provided & Reviewed with Pt/Caregiver Including Roles  & Responsibilities: Yes Additional Information Needs: y  Patient Condition: This patient's condition remains as documented in the Consult dated 11/16/11 @ 1654, in which the Rehabilitation Physician determined and documented that the patient's condition is appropriate for intensive rehabilitative care in an inpatient rehabilitation facility.  Preadmission Screen Completed By:  Oletta Darter, 11/17/2011 8:24 AM ______________________________________________________________________   Discussed status with Dr. Riley Kill on 11/17/11 at 0700 and received telephone approval for admission today.  Admission Coordinator:  Oletta Darter, time0827/Date6/25/13

## 2011-11-17 NOTE — Progress Notes (Signed)
Physical Therapy Treatment Patient Details Name: Gina Bowers MRN: [id number] DOB: 01-28-1926 Today's Date: 11/17/2011 Time: 4098-1191 PT Time Calculation (min): 18 min  PT Assessment / Plan / Recommendation Comments on Treatment Session  Pt continues to be very motivated to participate in therapy.  Pt required assistance for balance and stability when walking and VC to maintain upright posture.  Pt's gait pattern was improved from last session, with no noticable foot drop on L.  Continue with POC.    Follow Up Recommendations  Inpatient Rehab    Barriers to Discharge        Equipment Recommendations  Defer to next venue    Recommendations for Other Services    Frequency Min 4X/week   Plan Discharge plan remains appropriate;Frequency remains appropriate    Precautions / Restrictions Precautions Precautions: Fall Restrictions Weight Bearing Restrictions: No   Pertinent Vitals/Pain    Mobility  Bed Mobility Supine to Sit: 5: Supervision;With rails;HOB elevated Sitting - Scoot to Edge of Bed: 4: Min guard Details for Bed Mobility Assistance: VC cues for safety.   Transfers Sit to Stand: 4: Min guard;With upper extremity assist;With armrests;From bed Stand to Sit: 4: Min guard;To chair/3-in-1;With upper extremity assist Details for Transfer Assistance: [place]ard A and VC to move closer to chair when sitting and to properly use UE and to stabilize when standing from bed. Ambulation/Gait Ambulation/Gait Assistance: 4: Min assist Ambulation Distance (Feet): 200 Feet Assistive device: Rollator Ambulation/Gait Assistance Details: VC needed for upright posture.  Pt. experienced fatigue and took 5 min rest break on rollator.   Gait Pattern: Step-through pattern;Decreased stride length Gait velocity: decreased gait speed    Exercises     PT Diagnosis:    PT Problem List:   PT Treatment Interventions:     PT Goals Acute Rehab PT Goals PT Goal: Supine/Side to Sit - Progress:  Progressing toward goal PT Goal: Sit to Stand - Progress: Progressing toward goal PT Goal: Stand to Sit - Progress: Progressing toward goal PT Goal: Ambulate - Progress: Progressing toward goal  Visit Information  Last PT Received On: 11/17/11 Assistance Needed: +1    Subjective Data      Cognition  Overall Cognitive Status: Appears within functional limits for tasks assessed/performed Arousal/Alertness: Awake/alert Orientation Level: Appears intact for tasks assessed Behavior During Session: [facility] for tasks performed    Balance     End of Session PT - End of Session Equipment Utilized During Treatment: Gait belt Activity Tolerance: Patient tolerated treatment well Patient left: in bed;with call bell/phone within reach    Jaysiah Marchetta, SPTA 11/17/2011, 1:39 PM

## 2011-11-17 NOTE — H&P (Signed)
Physical Medicine and Rehabilitation Admission H&P  Chief Complaint   Patient presents with   .  Weakness   :  HPI: RICKELLE SYLVESTRE is a 78 y.o. right-handed female with history of coronary artery disease as well as multiple strokes. Patient is a resident of [facility]. Admitted 11/13/2011 with acute onset left-sided weakness. Per family member she stood up and used her walker to get back to bed when caregiver noted left facial droop and left-sided weakness. She has recovered nicely from history of previous strokes. She had been placed on aspirin/ Plavix. MRI of the brain showed 10 mm acute infarct in the deep cerebral white matter on the right. MRA of the head with diffuse intracranial atherosclerotic disease as well as occluded left vertebral artery. Echocardiogram with ejection fraction of 60% grade 1 diastolic dysfunction. Carotid Doppler with no ICA stenosis. Neurology services consulted presently patient remains on aspirin and Plavix therapy. Physical therapy evaluation completed. PT/M.D. as requested physical medicine rehabilitation consult to consider inpatient rehabilitation services. Patient was felt to be a good candidate for inpatient rehabilitation and was admitted for comprehensive rehabilitation program  Review of Systems  Genitourinary: Positive for frequency.  Musculoskeletal: Positive for myalgias.  Neurological: Positive for weakness.  Psychiatric/Behavioral: Positive for depression.  All other systems reviewed and are negative  Past Medical History   Diagnosis  Date   .  Coronary artery disease      DR. Alanda Amass IS PT'S CARDIOLOGIST - HX CABG AND HAS STENT AND TOLD SHE HAS ANOTHER BLOCKAGE-BEING TREATED MEDICALLY   .  Stroke      SEVERAL STROKES -TOLD SHE HAS CEREBELLUM BLOCKAGE AS RESULT OF STROKE--BUT NEUROLOGIST SAID HE DID NOT WANT TO DO ANY PROCEDURE BECAUSE OF HER AGE . PT HAS SLIGHT LEFT FOOT DROP-DRAGS FOOT WHEN WALKING - USES WALKER   .  High  cholesterol    .  Lung nodules      TOLD SHE HAS INTERSTITUAL LUNG DISEASE   .  Shortness of breath      AND WHEEZING AT TIMES--NO INHALERS   .  Vertigo    .  Arthritis    .  GERD (gastroesophageal reflux disease)    .  Urinary incontinence    .  UTI (lower urinary tract infection)      HX OF UTI'S-- LAST TIME COUPLE OF MONTHS AGO   .  Hypothyroidism    .  Blood transfusion  2007     AFTER HIP REPLACEMENT   .  Fracture  FEB 2013     FRACTURED LEFT ANKLE--NO SURGERY--WAS IN BOOT UNTIL COUPLE WEEKS AGO   .  Depression    .  Kidney stones      IN THE PAST   .  Carotid bruit      PT'S DAUGHTER STATES PT HAD RECENT CAROTID STUDY AND WAS TOLD NO SIGNIFICANT BLOCKAGES   .  [place] virus      COUPLE OF WEEKS AGO--ALL SYMTOMS RESOLVED PER PT    Past Surgical History   Procedure  Date   .  Coronary angioplasty  10/2010     WITH STENT PLACEMENT   .  Coronary artery bypass graft  2009   .  Joint replacement  2007     HIP REPLACEMENT -LEFT   .  Bladder tack      1969 AND 1990   .  Cystoscopy with injection  09/21/2011     Procedure: CYSTOSCOPY WITH INJECTION; Surgeon: Vonzell Schlatter  Patsi Sears, MD; Location: [facility]; Service: Urology; Laterality: N/A; Peri Urethral Macroplastique Injection and Estring Placement    History reviewed. No pertinent family history.  Social History: reports that she has never smoked. She has never used smokeless tobacco. She reports that she does not drink alcohol or use illicit drugs.  Allergies:  Allergies   Allergen  Reactions   .  Sulfonamide Derivatives  Hives    Medications Prior to Admission   Medication  Sig  Dispense  Refill   .  aspirin EC 81 MG tablet  Take 81 mg by mouth daily with breakfast.     .  clopidogrel (PLAVIX) 75 MG tablet  Take 75 mg by mouth daily with breakfast.     .  fish oil-omega-3 fatty acids 1000 MG capsule  Take 1 g by mouth daily with breakfast.     .  levothyroxine (SYNTHROID, LEVOTHROID) 50 MCG tablet  Take 50 mcg by mouth  daily with breakfast.     .  Multiple Vitamin (MULITIVITAMIN WITH MINERALS) TABS  Take 1 tablet by mouth daily with breakfast.     .  nitrofurantoin (MACRODANTIN) 100 MG capsule  Take 100 mg by mouth daily with breakfast.     .  pantoprazole (PROTONIX) 40 MG tablet  Take 40 mg by mouth daily with breakfast.     .  rosuvastatin (CRESTOR) 20 MG tablet  Take 20 mg by mouth every evening.     .  sertraline (ZOLOFT) 50 MG tablet  Take 75 mg by mouth every evening. Patient takes 1 and 1/2 tablets by mouth every day     .  trospium (SANCTURA) 20 MG tablet  Take 60 mg by mouth daily.     Marland Kitchen  DISCONTD: OVER THE COUNTER MEDICATION  IMMUNE BOOSTER -OTC      Home:  Home Living  Lives With: Alone  Available Help at Discharge: Personal care attendant (1 hour a day)  Type of Home: Apartment  Home Access: Level entry;Elevator  Home Layout: One level  Bathroom Shower/Tub: Walk-in shower;Door  [employer] Toilet: Handicapped height  Bathroom Accessibility: Yes  How Accessible: Accessible via walker  Home Adaptive Equipment: Other (comment);Wheelchair - manual;Grab bars around toilet;Grab bars in shower;Bedside commode/3-in-1;Shower chair with back;Reacher (rollator, lift chair)  Functional History:  Prior Function  Able to Take Stairs?: No  Driving: No  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[employer], Investment banker, operational, Therapist, music  Functional Status:  Mobility:  Bed Mobility  Bed Mobility: Supine to Sit;Sitting - Scoot to Edge of Bed  Supine to Sit: 4: Min guard;With rails;HOB elevated (30*)  Sitting - Scoot to Edge of Bed: 4: Min guard  Sit to Supine: 5: Supervision;HOB flat  Transfers  Transfers: Sit to Stand;Stand to Sit  Sit to Stand: 4: Min assist;With upper extremity assist;From bed;From chair/3-in-1;With armrests  Stand to Sit: 4: Min guard;To chair/3-in-1;With upper extremity assist;With armrests  Ambulation/Gait  Ambulation/Gait Assistance: 4: Min assist  Ambulation Distance (Feet): 160 Feet  Assistive device:  Rollator  Ambulation/Gait Assistance Details: Cues for posture and for safest use of RW. Patient with decreased hip/knee flexion as fatiqued set in from ambulation. Patient required assitance to ensure balance and safety with rollator.  Gait Pattern: Decreased step length - left;Decreased hip/knee flexion - left;Decreased dorsiflexion - left  Gait velocity: decreased   ADL:  ADL  Grooming: Performed;Wash/dry hands;Teeth care;Minimal assistance  Where Assessed - Grooming: Supported standing  Upper Body Dressing: Performed;Minimal assistance  Where Assessed - Upper Body Dressing: Supported sitting  Lower Body Dressing: Performed;Minimal assistance  Where Assessed - Lower Body Dressing: Unsupported sitting  Toilet Transfer: Performed;Minimal assistance  Toilet Transfer Method: Sit to stand  Toilet Transfer Equipment: Raised toilet seat with arms (or 3-in-1 over toilet)  Equipment Used: Rolling walker  Transfers/Ambulation Related to ADLs: min a with rollator  ADL Comments: Pt. required min A for dynamic sitting balance EOB. Pt. performed reaching activities with Lt. UE. Able to reach for cup and drink from it with min facilitation. Performed [facility] activities with focus on reciprocal movements of hand  Cognition:  Cognition  Overall Cognitive Status: Appears within functional limits for tasks assessed  Arousal/Alertness: Awake/alert  Orientation Level: Oriented X4  Attention: Selective  Memory: Appears intact  Awareness: Appears intact  Problem Solving: Appears intact  Safety/Judgment: Appears intact  Cognition  Overall Cognitive Status: Appears within functional limits for tasks assessed/performed  Arousal/Alertness: Awake/alert  Orientation Level: Appears intact for tasks assessed  Behavior During Session: [facility] for tasks performed  Blood pressure 112/74, pulse 56, temperature 98.3 F (36.8 C), temperature source Oral, resp. rate 18, height 5\' 1"  (1.549 m), weight 74 kg (163 lb 2.3 oz),  SpO2 94.00%.  Physical Exam  Constitutional: She is oriented to person, place, and time.  HENT:  Head: Normocephalic. PERRLA, EOMI, oral mucosa pink and moist Neck: Neck supple. No thyromegaly present.  Cardiovascular: Regular rhythm. Reg rate, no murmurs Pulmonary/Chest: Breath sounds normal. She has no wheezes or rhonchi, no distress Abdominal: She exhibits no distension. There is no tenderness. Bowel sounds positive Neurological: She is alert and oriented to person, place, and time. Good insight and awareness Mild left facial droop. Speech is mildly dysarthric but fully intelligible. She follows commands nicely. She names person place and date of birth. Skin: Skin is warm and dry.  Psychiatric: She has a normal mood and affect.  strength is 3+ to almost 4/5 in the left deltoid, biceps, triceps, grip, 3/5 in the left hip flexor knee extensor, 4/5 ankle dorsiflexor and plantar flexor  5/5 on the right side  Sensation is intact to light touch in both upper and both lower extremities  No evidence of aphasia  Results for orders placed during the hospital encounter of 11/12/11 (from the past 48 hour(s))   CBC Status: Abnormal    Collection Time    11/16/11 6:04 AM   Component  Value  Range  Comment    WBC  7.3  4.0 - 10.5 K/uL     RBC  3.78 (*)  3.87 - 5.11 MIL/uL     Hemoglobin  11.7 (*)  12.0 - 15.0 g/dL     HCT  40.9 (*)  81.1 - 46.0 %     MCV  93.9  78.0 - 100.0 fL     MCH  31.0  26.0 - 34.0 pg     MCHC  33.0  30.0 - 36.0 g/dL     RDW  91.4  78.2 - 95.6 %     Platelets  198  150 - 400 K/uL    BASIC METABOLIC PANEL Status: Abnormal    Collection Time    11/16/11 6:04 AM   Component  Value  Range  Comment    Sodium  138  135 - 145 mEq/L     Potassium  4.2  3.5 - 5.1 mEq/L     Chloride  101  96 - 112 mEq/L     CO2  28  19 - 32 mEq/L  Glucose, Bld  95  70 - 99 mg/dL     BUN  15  6 - 23 mg/dL     Creatinine, Ser  4.09  0.50 - 1.10 mg/dL     Calcium  9.1  8.4 - 10.5 mg/dL      GFR calc non Af Amer  84 (*)  >90 mL/min     GFR calc Af Amer  >90  >90 mL/min     No results found.  Post Admission Physician Evaluation:  1. Functional deficits secondary to right deep white matter thrombotic infarct. 2. Patient is admitted to receive collaborative, interdisciplinary care between the physiatrist, rehab nursing staff, and therapy team. 3. Patient's level of medical complexity and substantial therapy needs in context of that medical necessity cannot be provided at a lesser intensity of care such as a SNF. 4. Patient has experienced substantial functional loss from his/her baseline which was documented above under the "Functional History" and "Functional Status" headings. Judging by the patient's diagnosis, physical exam, and functional history, the patient has potential for functional progress which will result in measurable gains while on inpatient rehab. These gains will be of substantial and practical use upon discharge in facilitating mobility and self-care at the household level. 5. Physiatrist will provide 24 hour management of medical needs as well as oversight of the therapy plan/treatment and provide guidance as appropriate regarding the interaction of the two. 6. 24 hour rehab nursing will assist with bladder management, bowel management, safety, skin/wound care, disease management, medication administration, pain management and patient education and help integrate therapy concepts, techniques,education, etc. 7. PT will assess and treat for: fxn mobility,LES, safety, NMR, adaptive equipment, adaptive equipment. Goals are: mod I to supervision. 8. OT will assess and treat for: UES, safety, NMR, ADL's, safety, fxnl mobility. Goals are: mod I to supervision. 9. SLP will assess and treat for: n/a 10. Case Management and Social Worker will assess and treat for psychological issues and discharge planning. 11. Team conference will be held weekly to assess progress toward goals and  to determine barriers to discharge. 12. Patient will receive at least 3 hours of therapy per day at least 5 days per week. 13. ELOS: 10 days Prognosis: excellent Medical Problem List and Plan:  1. Right subcortical thrombotic infarct  2. DVT Prophylaxis/Anticoagulation: SCDs. Monitor for any signs of DVT  3. Hypothyroidism. Synthroid  4. Hyperlipidemia. Lipitor  5. Neuropsych: This patient is capable of making decisions on his/her own behalf.  6. Urinary incontinence with recurrent urinary tract infection. Continue Enablex and prophylactic Macrodantin. Check PVRs x3. Pt is followed by urology as an outpt. 7. Depression. Zoloft. Provide emotional support and positive reinforcement  8. CAD/CABG. No chest pain shortness of breath. Continue aspirin. Followup cardiology services as needed   Ivory Broad, MD 11/17/11

## 2011-11-17 NOTE — Progress Notes (Signed)
Pt transferred to Rehab. Orientated to room, and Rehab expectations. Diagnostic specific information given, safety video shown, and care plan discussed.

## 2011-11-17 NOTE — Progress Notes (Signed)
Subjective: No complaints  Objective: Vital signs in last 24 hours: Temp:  [98.3 F (36.8 C)-98.6 F (37 C)] 98.3 F (36.8 C) (06/25 0545) Pulse Rate:  [56-94] 56  (06/25 0545) Resp:  [18] 18  (06/25 0545) BP: (112-137)/(71-78) 112/74 mmHg (06/25 0545) SpO2:  [93 %-97 %] 94 % (06/25 0545) Weight:  [74 kg (163 lb 2.3 oz)] 74 kg (163 lb 2.3 oz) (06/24 1551) Weight change:  Last BM Date: 11/15/11  Intake/Output from previous day: 06/24 0701 - 06/25 0700 In: 360 [P.O.:360] Out: -  Intake/Output this shift:    General appearance: alert Resp: clear to auscultation bilaterally Cardio: regular rate and rhythm, S1, S2 normal, no murmur, click, rub or gallop  Lab Results:  [facility] 11/16/11 0604  WBC 7.3  HGB 11.7*  HCT 35.5*  PLT 198   BMET  Basename 11/16/11 0604  NA 138  K 4.2  CL 101  CO2 28  GLUCOSE 95  BUN 15  CREATININE 0.55  CALCIUM 9.1    Studies/Results: No results found.  Medications: I have reviewed the patient's current medications.  Assessment/Plan: Active Problems:  STROKE medical therapy, transfer to CIR today  Hypoxemia resolved    LOS: 5 days   Lachrista Heslin [name] 11/17/2011, 7:47 AM

## 2011-11-17 NOTE — Care Management Note (Signed)
    Page 1 of 1   11/17/2011     2:09:19 PM   CARE MANAGEMENT NOTE 11/17/2011  Patient:  Gina Bowers, Gina Bowers   Account Number:  [id number]  Date Initiated:  11/15/2011  Documentation initiated by:  [facility]  Subjective/Objective Assessment:   stroke     Action/Plan:   daughterRhetta Cleek [phone]   Anticipated DC Date:  11/17/2011   Anticipated DC Plan:  IP REHAB FACILITY  In-house referral  Clinical Social Worker      DC Planning Services  CM consult      PAC Choice  IP REHAB   Choice offered to / List presented to:             Status of service:  Completed, signed off Medicare Important Message given?   (If response is "NO", the following Medicare IM given date fields will be blank) Date Medicare IM given:   Date Additional Medicare IM given:    Discharge Disposition:  IP REHAB FACILITY  Per UR Regulation:  Reviewed for med. necessity/level of care/duration of stay  If discussed at Long Length of Stay Meetings, dates discussed:    Comments:  11/15/2011 1420 Spoke to pt's daughter, states pt will be evaluated for IP rehab on 6/24. States she had HH in 2009. NCM explained CM will continue to follow up until d/c for needs. Isidoro Donning RN CCM Case Mgmt phone [phone]

## 2011-11-17 NOTE — Progress Notes (Signed)
Agreed with above 11/17/2011 Doree Kuehne Elizabeth PTA [phone] pager [phone] office    

## 2011-11-18 DIAGNOSIS — Z5189 Encounter for other specified aftercare: Secondary | ICD-10-CM

## 2011-11-18 DIAGNOSIS — G8110 Spastic hemiplegia affecting unspecified side: Secondary | ICD-10-CM

## 2011-11-18 DIAGNOSIS — I6330 Cerebral infarction due to thrombosis of unspecified cerebral artery: Secondary | ICD-10-CM

## 2011-11-18 LAB — DIFFERENTIAL
Lymphocytes Relative: 39 % (ref 12–46)
Lymphs Abs: 2.7 10*3/uL (ref 0.7–4.0)
Monocytes Absolute: 0.5 10*3/uL (ref 0.1–1.0)
Monocytes Relative: 7 % (ref 3–12)
Neutro Abs: 3.5 10*3/uL (ref 1.7–7.7)

## 2011-11-18 LAB — COMPREHENSIVE METABOLIC PANEL
AST: 17 U/L (ref 0–37)
Albumin: 3.2 g/dL — ABNORMAL LOW (ref 3.5–5.2)
Calcium: 9.2 mg/dL (ref 8.4–10.5)
Creatinine, Ser: 0.49 mg/dL — ABNORMAL LOW (ref 0.50–1.10)
Sodium: 137 mEq/L (ref 135–145)

## 2011-11-18 LAB — CBC
MCH: 32.6 pg (ref 26.0–34.0)
MCV: 93.4 fL (ref 78.0–100.0)
Platelets: 193 10*3/uL (ref 150–400)
RDW: 13.1 % (ref 11.5–15.5)
WBC: 7 10*3/uL (ref 4.0–10.5)

## 2011-11-18 NOTE — Plan of Care (Signed)
Problem: RH BLADDER ELIMINATION Goal: RH STG MANAGE BLADDER WITH ASSISTANCE STG Manage Bladder With Assistance -Mod I  Outcome: Progressing Pt on enablex

## 2011-11-18 NOTE — Progress Notes (Signed)
Patient ID: Gina Bowers, female   DOB: 1925-06-30, 78 y.o.   MRN: [id number]  Subjective/Complaints:  Gina Bowers is a 78 y.o. right-handed female with history of coronary artery disease as well as multiple strokes. Patient is a resident of [facility]. Admitted 11/13/2011 with acute onset left-sided weakness. Per family member she stood up and used her walker to get back to bed when caregiver noted left facial droop and left-sided weakness. She has recovered nicely from history of previous strokes. She had been placed on aspirin/ Plavix. MRI of the brain showed 10 mm acute infarct in the deep cerebral white matter on the right. MRA of the head with diffuse intracranial atherosclerotic disease as well as occluded left vertebral artery. Echocardiogram with ejection fraction of 60% grade 1 diastolic dysfunction. Carotid Doppler with no ICA stenosis. Neurology services consulted presently patient remains on aspirin and Plavix therapy.  Review of Systems  Gastrointestinal: Positive for constipation.  Neurological: Positive for focal weakness.  All other systems reviewed and are negative.   Objective: Vital Signs: Blood pressure 114/75, pulse 70, temperature 97.8 F (36.6 C), temperature source Oral, resp. rate 18, height 5\' 2"  (1.575 m), weight 75.9 kg (167 lb 5.3 oz), SpO2 95.00%. No results found. Results for orders placed during the hospital encounter of 11/17/11 (from the past 72 hour(s))  CBC     Status: Abnormal   Collection Time   11/18/11  5:00 AM      Component Value Range Comment   WBC 7.0  4.0 - 10.5 K/uL    RBC 3.77 (*) 3.87 - 5.11 MIL/uL    Hemoglobin 12.3  12.0 - 15.0 g/dL    HCT 40.9 (*) 81.1 - 46.0 %    MCV 93.4  78.0 - 100.0 fL    MCH 32.6  26.0 - 34.0 pg    MCHC 34.9  30.0 - 36.0 g/dL    RDW 91.4  78.2 - 95.6 %    Platelets 193  150 - 400 K/uL   COMPREHENSIVE METABOLIC PANEL     Status: Abnormal   Collection Time   11/18/11  5:00 AM   Component Value Range Comment   Sodium 137  135 - 145 mEq/L    Potassium 4.0  3.5 - 5.1 mEq/L    Chloride 100  96 - 112 mEq/L    CO2 27  19 - 32 mEq/L    Glucose, Bld 93  70 - 99 mg/dL    BUN 16  6 - 23 mg/dL    Creatinine, Ser 2.13 (*) 0.50 - 1.10 mg/dL    Calcium 9.2  8.4 - 08.6 mg/dL    Total Protein 6.2  6.0 - 8.3 g/dL    Albumin 3.2 (*) 3.5 - 5.2 g/dL    AST 17  0 - 37 U/L    ALT 15  0 - 35 U/L    Alkaline Phosphatase 63  39 - 117 U/L    Total Bilirubin 0.4  0.3 - 1.2 mg/dL    GFR calc non Af Amer 87 (*) >90 mL/min    GFR calc Af Amer >90  >90 mL/min   DIFFERENTIAL     Status: Normal   Collection Time   11/18/11  5:00 AM      Component Value Range Comment   Neutrophils Relative 50  43 - 77 %    Neutro Abs 3.5  1.7 - 7.7 K/uL    Lymphocytes Relative 39  12 -  46 %    Lymphs Abs 2.7  0.7 - 4.0 K/uL    Monocytes Relative 7  3 - 12 %    Monocytes Absolute 0.5  0.1 - 1.0 K/uL    Eosinophils Relative 3  0 - 5 %    Eosinophils Absolute 0.2  0.0 - 0.7 K/uL    Basophils Relative 0  0 - 1 %    Basophils Absolute 0.0  0.0 - 0.1 K/uL      HEENT: normal Cardio: RRR Resp: CTA B/L GI: BS positive Extremity:  No Edema Skin:   Intact Neuro: Alert/Oriented, Abnormal Motor 3-/5 in L delt bi tri grip, 4/5 in L HF, KE, Ankle DF and Abnormal FMC Ataxic/ dec FMC Musc/Skel:  Normal   Assessment/Plan: 1. Functional deficits secondary to R frontal subcortical infarct with L HP which require 3+ hours per day of interdisciplinary therapy in a comprehensive inpatient rehab setting. Physiatrist is providing close team supervision and 24 hour management of active medical problems listed below. Physiatrist and rehab team continue to assess barriers to discharge/monitor patient progress toward functional and medical goals. FIM: FIM - Bathing Bathing Steps Patient Completed: Chest;Right Arm;Left Arm;Abdomen;Front perineal area;Buttocks;Right upper leg;Left upper leg Bathing: 4: Steadying  assist  FIM - Upper Body Dressing/Undressing Upper body dressing/undressing steps patient completed: Thread/unthread right bra strap;Thread/unthread left bra strap;Hook/unhook bra;Thread/unthread right sleeve of pullover shirt/dresss;Pull shirt over trunk;Put head through opening of pull over shirt/dress;Thread/unthread left sleeve of pullover shirt/dress Upper body dressing/undressing: 5: Supervision: Safety issues/verbal cues FIM - Lower Body Dressing/Undressing Lower body dressing/undressing steps patient completed: Thread/unthread right underwear leg;Thread/unthread left underwear leg;Pull underwear up/down;Thread/unthread right pants leg;Thread/unthread left pants leg;Pull pants up/down  FIM - Toileting Toileting steps completed by patient: Adjust clothing prior to toileting;Performs perineal hygiene;Adjust clothing after toileting Toileting: 4: Steadying assist  FIM - Diplomatic Services operational officer Devices: Bedside commode Toilet Transfers: 4-To toilet/BSC: Min A (steadying Pt. > 75%);4-From toilet/BSC: Min A (steadying Pt. > 75%)  FIM - Bed/Chair Transfer Bed/Chair Transfer Assistive Devices: Therapist, occupational: 4: Supine > Sit: Min A (steadying Pt. > 75%/lift 1 leg);4: Bed > Chair or W/C: Min A (steadying Pt. > 75%)     Comprehension Comprehension Mode: Auditory Comprehension: 5-Understands complex 90% of the time/Cues < 10% of the time  Expression Expression Mode: Verbal Expression: 5-Expresses complex 90% of the time/cues < 10% of the time  Social Interaction Social Interaction: 6-Interacts appropriately with others with medication or extra time (anti-anxiety, antidepressant).  Problem Solving Problem Solving: 5-Solves basic 90% of the time/requires cueing < 10% of the time  Memory Memory: 5-Recognizes or recalls 90% of the time/requires cueing < 10% of the time  Medical Problem List and Plan:  1. Right subcortical thrombotic infarct  2. DVT  Prophylaxis/Anticoagulation: SCDs. Monitor for any signs of DVT  3. Hypothyroidism. Synthroid  4. Hyperlipidemia. Lipitor  5. Neuropsych: This patient is capable of making decisions on his/her own behalf.  6. Urinary incontinence with recurrent urinary tract infection. Continue Enablex and prophylactic Macrodantin. Check PVRs x3. Pt is followed by urology as an outpt.  7. Depression. Zoloft. Provide emotional support and positive reinforcement  8. CAD/CABG. No chest pain shortness of breath. Continue aspirin. Followup cardiology services as needed       LOS (Days) 1 A FACE TO FACE EVALUATION WAS PERFORMED  Keiarah Orlowski E 11/18/2011, 9:35 AM

## 2011-11-18 NOTE — Evaluation (Signed)
Speech Language Pathology Assessment and Plan   Patient Details  Name: Gina Bowers MRN: [id number] Date of Birth: [birth year]  SLP Diagnosis:  borderline, mild dysarthria  Today's Date: 11/18/2011 Time: 1330-1430 Time Calculation (min): 60 min  Problem List:  Patient Active Problem List  Diagnosis  . HYPOTHYROIDISM  . HYPERLIPIDEMIA  . ANXIETY  . OBSTRUCTIVE SLEEP APNEA  . STROKE  . PULMONARY NODULE  . Hypoxemia  . TIA (transient ischemic attack)  . Weakness  . CVA (cerebral vascular accident)   Past Medical History:  Past Medical History  Diagnosis Date  . Coronary artery disease     DR. Alanda Amass IS PT'S CARDIOLOGIST - HX CABG AND HAS STENT AND TOLD SHE HAS ANOTHER BLOCKAGE-BEING TREATED MEDICALLY  . Stroke     SEVERAL STROKES -TOLD SHE HAS CEREBELLUM BLOCKAGE AS RESULT OF STROKE--BUT NEUROLOGIST SAID HE DID NOT WANT TO DO ANY PROCEDURE BECAUSE OF HER AGE .  PT HAS SLIGHT LEFT FOOT DROP-DRAGS FOOT WHEN WALKING - USES WALKER  . High cholesterol   . Lung nodules     TOLD SHE HAS INTERSTITUAL LUNG DISEASE  . Shortness of breath     AND WHEEZING AT TIMES--NO INHALERS  . Vertigo   . Arthritis   . GERD (gastroesophageal reflux disease)   . Urinary incontinence   . UTI (lower urinary tract infection)     HX OF UTI'S-- LAST TIME COUPLE OF MONTHS AGO  . Hypothyroidism   . Blood transfusion 2007    AFTER HIP REPLACEMENT  . Fracture FEB 2013    FRACTURED LEFT ANKLE--NO SURGERY--WAS IN BOOT UNTIL COUPLE WEEKS AGO  . Depression   . Kidney stones     IN THE PAST  . Carotid bruit     PT'S DAUGHTER STATES PT HAD RECENT CAROTID STUDY AND WAS TOLD NO SIGNIFICANT BLOCKAGES  . [place] virus     COUPLE OF WEEKS AGO--ALL SYMTOMS RESOLVED PER PT   Past Surgical History:  Past Surgical History  Procedure Date  . Coronary angioplasty 10/2010    WITH STENT PLACEMENT  . Coronary artery bypass graft 2009  . Joint replacement 2007    HIP REPLACEMENT -LEFT  . Bladder tack    1969 AND 1990  . Cystoscopy with injection 09/21/2011    Procedure: CYSTOSCOPY WITH INJECTION;  Surgeon: Kathi Ludwig, MD;  Location: [facility];  Service: Urology;  Laterality: N/A;  Peri Urethral Macroplastique Injection and Estring Placement    Assessment / Plan / Recommendation Clinical Impression  Gina Bowers is an 78 y.o. right-handed female with history of coronary artery disease as well as multiple strokes. Patient is a resident of [facility] independent living. She was admitted 11/13/2011 with acute onset left-sided weakness. Per family report she stood up and used her walker to get back to bed when caregiver noted left facial droop and left-sided weakness. She has recovered nicely from history of previous strokes. MRI of the brain showed 10 mm acute infarct in the deep cerebral white matter on the right.  Patient transferred to [facility] 11/17/11 and upon evaluation today presents with a borderline mild dysarthria with improved breath support as compared to evaluation on acute care 11/13/11 but continued left sided labial weakness, which results in imprecise articulation with fatigue.  Patient and daughter confirms that she still remains Mod I with overall intelligibility and her cognition is at baseline function.  As a result, SLP facilitated session by educating patient and providing handouts for oral motor exercises  to be completed independently and compensatory strategies to utilize as needed.  No further skilled SLP services are warranted at this time.      SLP Assessment  Patient does not need any further Speech Lanaguage Pathology Services    Recommendations  Follow up Recommendations: None Equipment Recommended: None recommended by SLP    SLP Frequency  n/a  SLP Treatment/Interventions  n/a   Pain Pain Assessment Pain Assessment: No/denies pain Pain Score: 0-No pain Prior Functioning Cognitive/Linguistic Baseline: Within functional limits  Short Term  Goals: n/a  See FIM for current functional status Refer to Care Plan for Long Term Goals  Recommendations for other services: None  Discharge Criteria: Patient will be discharged from SLP if patient refuses treatment 3 consecutive times without medical reason, if treatment goals not met, if there is a change in medical status, if patient makes no progress towards goals or if patient is discharged from hospital.  The above assessment, treatment plan, treatment alternatives and goals were discussed and mutually agreed upon: by patient and by family  Charlane Ferretti., CCC-SLP [phone]  Jareth Pardee 11/18/2011, 2:57 PM

## 2011-11-18 NOTE — Plan of Care (Signed)
Problem: RH BOWEL ELIMINATION Goal: RH STG MANAGE BOWEL W/MEDICATION W/ASSISTANCE STG Manage Bowel with Medication with Assistance. Min assist  Outcome: Progressing Patient has prn medication for bowels, none scheduled, last BM 11-18-11

## 2011-11-18 NOTE — Progress Notes (Signed)
Patient information reviewed and entered into [facility] system by Valeriano Bain, RN, CRRN, PPS Coordinator.  Information including medical coding and functional independence measure will be reviewed and updated through discharge.     Per nursing patient was given "Data Collection Information Summary for Patients in Inpatient Rehabilitation Facilities with attached "Privacy Act Statement-Health Care Records" upon admission.   

## 2011-11-18 NOTE — Evaluation (Signed)
Physical Therapy Assessment and Plan  Patient Details  Name: Gina Bowers MRN: [id number] Date of Birth: 04-25-1926  PT Diagnosis: Abnormality of gait, Coordination disorder and Hemiparesis non-dominant Rehab Potential: Excellent ELOS: 5-7 days   Today's Date: 11/18/2011 Time: 4098-1191 Time Calculation (min): 57 min  Problem List:  Patient Active Problem List  Diagnosis  . HYPOTHYROIDISM  . HYPERLIPIDEMIA  . ANXIETY  . OBSTRUCTIVE SLEEP APNEA  . STROKE  . PULMONARY NODULE  . Hypoxemia  . TIA (transient ischemic attack)  . Weakness  . CVA (cerebral vascular accident)    Past Medical History:  Past Medical History  Diagnosis Date  . Coronary artery disease     DR. Alanda Amass IS PT'S CARDIOLOGIST - HX CABG AND HAS STENT AND TOLD SHE HAS ANOTHER BLOCKAGE-BEING TREATED MEDICALLY  . Stroke     SEVERAL STROKES -TOLD SHE HAS CEREBELLUM BLOCKAGE AS RESULT OF STROKE--BUT NEUROLOGIST SAID HE DID NOT WANT TO DO ANY PROCEDURE BECAUSE OF HER AGE .  PT HAS SLIGHT LEFT FOOT DROP-DRAGS FOOT WHEN WALKING - USES WALKER  . High cholesterol   . Lung nodules     TOLD SHE HAS INTERSTITUAL LUNG DISEASE  . Shortness of breath     AND WHEEZING AT TIMES--NO INHALERS  . Vertigo   . Arthritis   . GERD (gastroesophageal reflux disease)   . Urinary incontinence   . UTI (lower urinary tract infection)     HX OF UTI'S-- LAST TIME COUPLE OF MONTHS AGO  . Hypothyroidism   . Blood transfusion 2007    AFTER HIP REPLACEMENT  . Fracture FEB 2013    FRACTURED LEFT ANKLE--NO SURGERY--WAS IN BOOT UNTIL COUPLE WEEKS AGO  . Depression   . Kidney stones     IN THE PAST  . Carotid bruit     PT'S DAUGHTER STATES PT HAD RECENT CAROTID STUDY AND WAS TOLD NO SIGNIFICANT BLOCKAGES  . [place] virus     COUPLE OF WEEKS AGO--ALL SYMTOMS RESOLVED PER PT   Past Surgical History:  Past Surgical History  Procedure Date  . Coronary angioplasty 10/2010    WITH STENT PLACEMENT  . Coronary artery bypass graft 2009   . Joint replacement 2007    HIP REPLACEMENT -LEFT  . Bladder tack     1969 AND 1990  . Cystoscopy with injection 09/21/2011    Procedure: CYSTOSCOPY WITH INJECTION;  Surgeon: Kathi Ludwig, MD;  Location: [facility];  Service: Urology;  Laterality: N/A;  Peri Urethral Macroplastique Injection and Estring Placement    Assessment & Plan Clinical Impression: Patient is a 78 y.o. year old female with recent admission to the hospital on 6/21 with right CVA.  Patient transferred to CIR on 11/17/2011 .   Patient currently requires min with mobility secondary to muscle weakness, decreased cardiorespiratoy endurance, impaired timing and sequencing, unbalanced muscle activation and decreased coordination and decreased standing balance and decreased balance strategies. Berg= 37/56, high fall risk. Prior to hospitalization, patient was modified independent with mobility and lived with Alone in a Independent living facility home.  Home access is  Level entry.  Patient will benefit from skilled PT intervention to maximize safe functional mobility, minimize fall risk and decrease caregiver burden for planned discharge home with intermittent assist.  Anticipate patient will benefit from follow up [facility] at discharge.  PT - End of Session Activity Tolerance: Tolerates 30+ min activity without fatigue Endurance Deficit: Yes Endurance Deficit Description: Pt with dyspnea and need for rest breaks during bathing  and dressing tasks. PT Assessment Rehab Potential: Excellent Barriers to Discharge: None PT Plan PT Frequency: 2-3 X/day, 60-90 minutes Estimated Length of Stay: 5-7 days PT Treatment/Interventions: Ambulation/gait training;Balance/vestibular training;Community reintegration;Discharge planning;DME/adaptive equipment instruction;Functional mobility training;Neuromuscular re-education;Patient/family education;Stair training;Therapeutic Activities;Therapeutic Exercise;UE/LE Strength taining/ROM;UE/LE  Coordination activities PT Recommendation Follow Up Recommendations: Home health PT Equipment Recommended: None recommended by PT  PT Evaluation Precautions/Restrictions Precautions Precautions: Fall Precaution Comments: LOB with turns  Restrictions Weight Bearing Restrictions: No Pain Pain Assessment Pain Assessment: No/denies pain Home Living/Prior Functioning Home Living Lives With: Alone Available Help at Discharge: Personal care attendant;Family Type of Home: Independent living facility Home Access: Level entry Home Layout: One level Home Adaptive Equipment: Dan Humphreys - four wheeled Prior Function Level of Independence: Needs assistance with ADLs;Requires assistive device for independence Driving: No 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ipad, bingo, dominoes, outings, exercise class Vision/Perception  Vision - History Baseline Vision: Bifocals Patient Visual Report: No change from baseline Vision - Assessment Eye Alignment: Within Functional Limits Vision Assessment: Vision tested Perception Perception: Within Functional Limits Praxis Praxis: Intact  Cognition Overall Cognitive Status: Appears within functional limits for tasks assessed Arousal/Alertness: Awake/alert Orientation Level: Oriented X4 Attention: Selective Selective Attention: Appears intact Memory: Appears intact Awareness: Appears intact Problem Solving: Appears intact Safety/Judgment: Appears intact Comments: Needs min instructional cueing to make sure she locks her walker brakes and pushed up from the surface to stand. Sensation Sensation Light Touch: Appears Intact Stereognosis: Impaired Detail Stereognosis Impaired Details: Impaired LUE Proprioception: Appears Intact Coordination Gross Motor Movements are Fluid and Coordinated: No Fine Motor Movements are Fluid and Coordinated: No Coordination and Movement Description: Pt with decreased LUE gross and FM coordination.  Currently at an active asisst  level with supervision for selfcare tasks. Motor  Motor Motor: Hemiplegia Motor - Skilled Clinical Observations: mild coordination impairment in the LUE and LE  Mobility Bed Mobility Bed Mobility: Sit to Supine Rolling Right: 5: Supervision;With rail Rolling Right Details: Verbal cues for technique Right Sidelying to Sit: 4: Min assist;HOB flat;With rails Sit to Supine: 5: Supervision Transfers Sit to Stand: 5: Supervision Stand to Sit: 3: Mod assist Stand to Sit Details: Pt with significant LOB while turning to sit. Locomotion  Ambulation Ambulation/Gait Assistance: 5: Supervision Ambulation Distance (Feet): 150 Feet Assistive device: 4-wheeled walker Gait Gait Pattern: Left foot flat (pt able to correct foot flat contact with instruction) Gait velocity: Decreased speed Stairs / Additional Locomotion Stairs: Yes Stairs Assistance: 4: Min guard Stair Management Technique: Two rails Number of Stairs: 5  Height of Stairs: 6  Wheelchair Mobility Wheelchair Mobility: No (ambulatory on unit)  Trunk/Postural Assessment  Thoracic Assessment Thoracic Assessment: Exceptions to [facility] Thoracic AROM Overall Thoracic AROM Comments: slight thoracic kyphosis in sitting and standing.  Balance Balance Balance Assessed: Yes Standardized Balance Assessment Standardized Balance Assessment: Berg Balance Test Berg Balance Test Sit to Stand: Able to stand  independently using hands Standing Unsupported: Able to stand 2 minutes with supervision Sitting with Back Unsupported but Feet Supported on Floor or Stool: Able to sit safely and securely 2 minutes Stand to Sit: Controls descent by using hands Transfers: Able to transfer safely, minor use of hands Standing Unsupported with Eyes Closed: Able to stand 10 seconds safely Standing Ubsupported with Feet Together: Able to place feet together independently and stand for 1 minute with supervision From Standing, Reach Forward with Outstretched Arm:  Can reach forward >12 cm safely (5") From Standing Position, Pick up Object from Floor: Able to pick up shoe, needs supervision  From Standing Position, Turn to Look Behind Over each Shoulder: Looks behind from both sides and weight shifts well Turn 360 Degrees: Able to turn 360 degrees safely but slowly Standing Unsupported, Alternately Place Feet on Step/Stool: Needs assistance to keep from falling or unable to try Standing Unsupported, One Foot in Front: Loses balance while stepping or standing Standing on One Leg: Tries to lift leg/unable to hold 3 seconds but remains standing independently Total Score: 37  Static Sitting Balance Static Sitting - Balance Support: No upper extremity supported Static Sitting - Level of Assistance: 7: Independent Dynamic Sitting Balance Dynamic Sitting - Balance Support: No upper extremity supported Dynamic Sitting - Level of Assistance: 7: Independent Static Standing Balance Static Standing - Balance Support: Bilateral upper extremity supported Dynamic Standing Balance Dynamic Standing - Level of Assistance: 4: Min assist Extremity Assessment  RUE Assessment RUE Assessment: Within Functional Limits LUE Assessment LUE Assessment: Exceptions to WFL LUE AROM (degrees) LUE Overall AROM Comments: Pt with AROM overall WFLs.  Does exhibit increased weakness in the LUE currently at a 3/5 level for grip and 3+5 for shoulder flexion and elbow flexion/ extension. RLE Assessment RLE Assessment:  (grossly 4/5) LLE Assessment LLE Assessment:  (grossly 3+/5)  See FIM for current functional status Refer to Care Plan for Long Term Goals  Recommendations for other services: None  Discharge Criteria: Patient will be discharged from PT if patient refuses treatment 3 consecutive times without medical reason, if treatment goals not met, if there is a change in medical status, if patient makes no progress towards goals or if patient is discharged from hospital.  The  above assessment, treatment plan, treatment alternatives and goals were discussed and mutually agreed upon: by patient  [name] 11/18/2011, 10:30 AM

## 2011-11-18 NOTE — Progress Notes (Signed)
Occupational Therapy Session Note  Patient Details  Name: Gina Bowers MRN: [id number] Date of Birth: 08-17-1925  Today's Date: 11/18/2011 Time: 4098-1191 Time Calculation (min): 33 min  Skilled Therapeutic Interventions/Progress Updates:    Pt worked on LUE FM tasks sitting on EOB.  Worked on sorting cards, picking up pegs one at a time and translating to and from her palm. Pt with occasional drops.   Also educated on theraputty exercises for gross grasp and manipulation.  Pt able to return demonstrate.  Her daughter plans on purchasing a deck of cards for her to work with as well.    Therapy Documentation Precautions:  Precautions Precautions: Fall Precaution Comments: LOB with turns  Restrictions Weight Bearing Restrictions: No Vital Signs: Therapy Vitals Temp: 98.5 F (36.9 C) Temp src: Oral Pulse Rate: 61  Resp: 18  Patient Position, if appropriate: Lying Oxygen Therapy O2 Device: None (Room air) Pain: Pain Assessment Pain Assessment: No/denies pain Pain Score: 0-No pain  Other Treatments:    See FIM for current functional status  Therapy/Group: Individual Therapy  Gina Bowers OTR/L 11/18/2011, 4:22 PM

## 2011-11-18 NOTE — Progress Notes (Signed)
Inpatient Rehabilitation Center Individual Statement of Services  Patient Name:  Gina Bowers  Date:  11/18/2011  Welcome to the Inpatient Rehabilitation Center.  Our goal is to provide you with an individualized program based on your diagnosis and situation, designed to meet your specific needs.  With this comprehensive rehabilitation program, you will be expected to participate in at least 3 hours of rehabilitation therapies Monday-Friday, with modified therapy programming on the weekends.  Your rehabilitation program will include the following services:  Physical Therapy (PT), Occupational Therapy (OT), Speech Therapy (ST), 24 hour per day rehabilitation nursing, Therapeutic Recreaction (TR), Case Management (RN and Child psychotherapist), Rehabilitation Medicine, Nutrition Services and Pharmacy Services  Weekly team conferences will be held on Wednesdays to discuss your progress.  Your RN Case Designer, television/film set will talk with you frequently to get your input and to update you on team discussions.  Team conferences with you and your family in attendance may also be held.  Expected length of stay: 7-10 days   Overall anticipated outcome: Modified independent  Depending on your progress and recovery, your program may change.  Your RN Case Estate agent will coordinate services and will keep you informed of any changes.  Your RN Sports coach and SW names and contact numbers are listed  below.  The following services may also be recommended but are not provided by the Inpatient Rehabilitation Center:   Driving Evaluations  Home Health Rehabiltiation Services  Outpatient Rehabilitatation [facility]  Vocational Rehabilitation   Arrangements will be made to provide these services after discharge if needed.  Arrangements include referral to agencies that provide these services.  Your insurance has been verified to be:  Medicare and U[employer] Your primary doctor is:  Dr Kirby Funk  Pertinent information will be shared with your doctor and your insurance company.  Case Manager: Lutricia Horsfall, [facility] [phone]  Social Worker:  Dossie Der, [place] [phone]  Information discussed with and copy given to patient by: Meryl Dare, 11/18/2011

## 2011-11-18 NOTE — Evaluation (Addendum)
Occupational Therapy Assessment and Plan  Patient Details  Name: Gina Bowers MRN: [id number] Date of Birth: November 20, 1925  OT Diagnosis: hemiplegia affecting non-dominant side and muscle weakness (generalized) Rehab Potential: Rehab Potential: Excellent ELOS: 7-10 days   Today's Date: 11/18/2011 Time: 0800-0858 Time Calculation (min): 58 min  Problem List:  Patient Active Problem List  Diagnosis  . HYPOTHYROIDISM  . HYPERLIPIDEMIA  . ANXIETY  . OBSTRUCTIVE SLEEP APNEA  . STROKE  . PULMONARY NODULE  . Hypoxemia  . TIA (transient ischemic attack)  . Weakness  . CVA (cerebral vascular accident)    Past Medical History:  Past Medical History  Diagnosis Date  . Coronary artery disease     DR. Alanda Amass IS PT'S CARDIOLOGIST - HX CABG AND HAS STENT AND TOLD SHE HAS ANOTHER BLOCKAGE-BEING TREATED MEDICALLY  . Stroke     SEVERAL STROKES -TOLD SHE HAS CEREBELLUM BLOCKAGE AS RESULT OF STROKE--BUT NEUROLOGIST SAID HE DID NOT WANT TO DO ANY PROCEDURE BECAUSE OF HER AGE .  PT HAS SLIGHT LEFT FOOT DROP-DRAGS FOOT WHEN WALKING - USES WALKER  . High cholesterol   . Lung nodules     TOLD SHE HAS INTERSTITUAL LUNG DISEASE  . Shortness of breath     AND WHEEZING AT TIMES--NO INHALERS  . Vertigo   . Arthritis   . GERD (gastroesophageal reflux disease)   . Urinary incontinence   . UTI (lower urinary tract infection)     HX OF UTI'S-- LAST TIME COUPLE OF MONTHS AGO  . Hypothyroidism   . Blood transfusion 2007    AFTER HIP REPLACEMENT  . Fracture FEB 2013    FRACTURED LEFT ANKLE--NO SURGERY--WAS IN BOOT UNTIL COUPLE WEEKS AGO  . Depression   . Kidney stones     IN THE PAST  . Carotid bruit     PT'S DAUGHTER STATES PT HAD RECENT CAROTID STUDY AND WAS TOLD NO SIGNIFICANT BLOCKAGES  . [place] virus     COUPLE OF WEEKS AGO--ALL SYMTOMS RESOLVED PER PT   Past Surgical History:  Past Surgical History  Procedure Date  . Coronary angioplasty 10/2010    WITH STENT PLACEMENT  . Coronary  artery bypass graft 2009  . Joint replacement 2007    HIP REPLACEMENT -LEFT  . Bladder tack     1969 AND 1990  . Cystoscopy with injection 09/21/2011    Procedure: CYSTOSCOPY WITH INJECTION;  Surgeon: Kathi Ludwig, MD;  Location: [facility];  Service: Urology;  Laterality: N/A;  Peri Urethral Macroplastique Injection and Estring Placement    Assessment & Plan Clinical Impression: Patient is a 78 y.o. year old female with recent admission to the hospital on Admitted 11/13/2011 with acute onset left-sided weakness  MRI revealed right CVA.  Patient transferred to CIR on 11/17/2011 . Patient currently requires min with basic self-care skills and IADL secondary to impaired timing and sequencing and decreased coordination.  Prior to hospitalization, patient could complete ADLS with independence.  Patient will benefit from skilled intervention to decrease level of assist with basic self-care skills, increase independence with basic self-care skills and increase level of independence with iADL prior to discharge .  Anticipate patient will require occassional supervisionbut overall will reach modified independent for most selfcare tasks. and follow up home health.  OT - End of Session Activity Tolerance: Tolerates 30+ min activity with multiple rests Endurance Deficit: Yes Endurance Deficit Description: Pt with dyspnea and need for rest breaks during bathing and dressing tasks. OT Assessment Rehab Potential: Excellent  OT Plan OT Frequency: 1-2 X/day, 60-90 minutes Estimated Length of Stay: 7-10 days OT Treatment/Interventions: Balance/vestibular training;DME/adaptive equipment instruction;Patient/family education;Therapeutic Exercise;UE/LE Strength taining/ROM;Self Care/advanced ADL retraining;UE/LE Coordination activities;Therapeutic Activities;Community reintegration;Discharge planning;Functional mobility training OT Recommendation Follow Up Recommendations: Home health OT Equipment  Recommended: None recommended by OT  OT Evaluation Precautions/Restrictions  Precautions Precautions: Fall Restrictions Weight Bearing Restrictions: No  Vital Signs Therapy Vitals Temp: 97.8 F (36.6 C) Temp src: Oral Pulse Rate: 70  Resp: 18  BP: 114/75 mmHg Patient Position, if appropriate: Sitting Oxygen Therapy SpO2: 95 % O2 Device: None (Room air) Pulse Oximetry Type: Intermittent Pain Pain Assessment Pain Assessment: No/denies pain  ADL ADL Eating: Set up Where Assessed-Eating: Bed level Grooming: Minimal assistance Where Assessed-Grooming: Chair Upper Body Bathing: Setup Where Assessed-Upper Body Bathing: Chair;Sitting at sink Lower Body Bathing: Minimal assistance Where Assessed-Lower Body Bathing: Standing at sink;Sitting at sink;Chair Upper Body Dressing: Setup Where Assessed-Upper Body Dressing: Sitting at sink Lower Body Dressing: Minimal assistance Where Assessed-Lower Body Dressing: Standing at sink;Sitting at sink;Chair Toileting: Minimal assistance Where Assessed-Toileting: Bedside Commode Toilet Transfer: Minimal assistance Toilet Transfer Method: Stand pivot Toilet Transfer Equipment: Bedside commode Tub/Shower Transfer: Not assessed Film/video editor: Not assessed ADL Comments: Pt overall min assist for balance with selfcare tasks.  Slight decreased coordination and strength in the LUE but uses it functionally as an active assist for bathing, dressing, and grooming tasks. Vision/Perception  Vision - History Baseline Vision: Wears glasses all the time Patient Visual Report: No change from baseline Vision - Assessment Eye Alignment: Within Functional Limits Vision Assessment: Vision tested Perception Perception: Within Functional Limits Praxis Praxis: Intact  Cognition Overall Cognitive Status: Appears within functional limits for tasks assessed Arousal/Alertness: Awake/alert Orientation Level: Oriented X4 Attention:  Selective Selective Attention: Appears intact Memory: Appears intact Awareness: Appears intact Problem Solving: Appears intact Safety/Judgment: Appears intact Comments: Needs min instructional cueing to make sure she locks her walker brakes and pushed up from the surface to stand. Sensation Sensation Light Touch: Appears Intact Stereognosis: Impaired Detail Stereognosis Impaired Details: Impaired LUE Proprioception: Appears Intact Coordination Gross Motor Movements are Fluid and Coordinated: No Fine Motor Movements are Fluid and Coordinated: No Coordination and Movement Description: Pt with decreased LUE gross and FM coordination.  Currently at an active asisst level with supervision for selfcare tasks. Motor  Motor Motor: Hemiplegia Motor - Skilled Clinical Observations: mild coordination impairment in the LUE and LE Mobility  Bed Mobility Bed Mobility: Rolling Right;Right Sidelying to Sit Rolling Right: 5: Supervision;With rail Rolling Right Details: Verbal cues for technique Right Sidelying to Sit: 4: Min assist;HOB flat;With rails  Trunk/Postural Assessment  Thoracic Assessment Thoracic Assessment: Exceptions to [facility] Thoracic AROM Overall Thoracic AROM Comments: slight thoracic kyphosis in sitting and standing.  Balance Balance Balance Assessed: Yes Static Sitting Balance Static Sitting - Balance Support: No upper extremity supported Static Sitting - Level of Assistance: 5: Stand by assistance Dynamic Sitting Balance Dynamic Sitting - Balance Support: No upper extremity supported;Left upper extremity supported;Right upper extremity supported Dynamic Sitting - Level of Assistance: 5: Stand by assistance Static Standing Balance Static Standing - Balance Support: Right upper extremity supported;Left upper extremity supported Dynamic Standing Balance Dynamic Standing - Level of Assistance: 5: Stand by assistance Extremity/Trunk Assessment RUE Assessment RUE Assessment:  Within Functional Limits LUE Assessment LUE Assessment: Exceptions to WFL LUE AROM (degrees) LUE Overall AROM Comments: Pt with AROM overall WFLs.  Does exhibit increased weakness in the LUE currently at a 3/5 level for grip and  3+5 for shoulder flexion and elbow flexion/ extension.  See FIM for current functional status Refer to Care Plan for Long Term Goals  Recommendations for other services: None  Discharge Criteria: Patient will be discharged from OT if patient refuses treatment 3 consecutive times without medical reason, if treatment goals not met, if there is a change in medical status, if patient makes no progress towards goals or if patient is discharged from hospital.  The above assessment, treatment plan, treatment alternatives and goals were discussed and mutually agreed upon: by patient  Began education on selfcare retraining at the sink level sit to stand.  Min instructional cueing for hand placement with sit to stand and for locking the rollator brakes when sitting down or attempting to stand.  Pt overall min assist level for balance.  Slight decreased coordination and strength noted in the LUE.  Jakiya Bookbinder OTR/L 11/18/2011, 9:15 AM

## 2011-11-19 NOTE — Progress Notes (Signed)
Physical Therapy Session Note  Patient Details  Name: Gina Bowers MRN: [id number] Date of Birth: 04/21/1926  Today's Date: 11/19/2011 Time: 4098-1191 Time Calculation (min): 48 min  Short Term Goals: Week 1:  PT Short Term Goal 1 (Week 1): Same as LTGs  Skilled Therapeutic Interventions/Progress Updates:    See below for details.  Therapy Documentation Precautions:  Precautions Precautions: Fall Precaution Comments: LOB with turns  Restrictions Weight Bearing Restrictions: No Pain: Pain Assessment Pain Assessment: No/denies pain Locomotion :  Gait training on unit with rollator 150' with supervision, pt focusing on heel strike on L, cueing herself independently. Balance:  Biodex training to address dynamic balance.  Performed random control and limits of stability, with close supervision. Exercises:  Kinetron in standing on 30 cm2 x 2 min. For LE strengthening.  Pt fatigued after doing this. See FIM for current functional status  Therapy/Group: Individual Therapy  [name] 11/19/2011, 4:26 PM

## 2011-11-19 NOTE — Progress Notes (Signed)
Patient ID: Gina Bowers, female   DOB: 06/25/1925, 78 y.o.   MRN: [id number]  Subjective/Complaints:  Gina Bowers is a 78 y.o. right-handed female with history of coronary artery disease as well as multiple strokes. Patient is a resident of [facility] independent living. Admitted 11/13/2011 with acute onset left-sided weakness. Per family member she stood up and used her walker to get back to bed when caregiver noted left facial droop and left-sided weakness. She has recovered nicely from history of previous strokes. She had been placed on aspirin/ Plavix. MRI of the brain showed 10 mm acute infarct in the deep cerebral white matter on the right. MRA of the head with diffuse intracranial atherosclerotic disease as well as occluded left vertebral artery. Echocardiogram with ejection fraction of 60% grade 1 diastolic dysfunction. Carotid Doppler with no ICA stenosis. Neurology services consulted presently patient remains on aspirin and Plavix therapy.  Tired but no other c/os  Review of Systems  Gastrointestinal: Positive for constipation.  Neurological: Positive for focal weakness.  All other systems reviewed and are negative.   Objective: Vital Signs: Blood pressure 113/69, pulse 57, temperature 97.9 F (36.6 C), temperature source Oral, resp. rate 18, height 5\' 2"  (1.575 m), weight 74.617 kg (164 lb 8 oz), SpO2 92.00%. No results found. Results for orders placed during the hospital encounter of 11/17/11 (from the past 72 hour(s))  CBC     Status: Abnormal   Collection Time   11/18/11  5:00 AM      Component Value Range Comment   WBC 7.0  4.0 - 10.5 K/uL    RBC 3.77 (*) 3.87 - 5.11 MIL/uL    Hemoglobin 12.3  12.0 - 15.0 g/dL    HCT 40.9 (*) 81.1 - 46.0 %    MCV 93.4  78.0 - 100.0 fL    MCH 32.6  26.0 - 34.0 pg    MCHC 34.9  30.0 - 36.0 g/dL    RDW 91.4  78.2 - 95.6 %    Platelets 193  150 - 400 K/uL   COMPREHENSIVE METABOLIC PANEL     Status: Abnormal   Collection Time   11/18/11  5:00 AM      Component Value Range Comment   Sodium 137  135 - 145 mEq/L    Potassium 4.0  3.5 - 5.1 mEq/L    Chloride 100  96 - 112 mEq/L    CO2 27  19 - 32 mEq/L    Glucose, Bld 93  70 - 99 mg/dL    BUN 16  6 - 23 mg/dL    Creatinine, Ser 2.13 (*) 0.50 - 1.10 mg/dL    Calcium 9.2  8.4 - 08.6 mg/dL    Total Protein 6.2  6.0 - 8.3 g/dL    Albumin 3.2 (*) 3.5 - 5.2 g/dL    AST 17  0 - 37 U/L    ALT 15  0 - 35 U/L    Alkaline Phosphatase 63  39 - 117 U/L    Total Bilirubin 0.4  0.3 - 1.2 mg/dL    GFR calc non Af Amer 87 (*) >90 mL/min    GFR calc Af Amer >90  >90 mL/min   DIFFERENTIAL     Status: Normal   Collection Time   11/18/11  5:00 AM      Component Value Range Comment   Neutrophils Relative 50  43 - 77 %    Neutro Abs 3.5  1.7 - 7.7 K/uL  Lymphocytes Relative 39  12 - 46 %    Lymphs Abs 2.7  0.7 - 4.0 K/uL    Monocytes Relative 7  3 - 12 %    Monocytes Absolute 0.5  0.1 - 1.0 K/uL    Eosinophils Relative 3  0 - 5 %    Eosinophils Absolute 0.2  0.0 - 0.7 K/uL    Basophils Relative 0  0 - 1 %    Basophils Absolute 0.0  0.0 - 0.1 K/uL      HEENT: normal Cardio: RRR Resp: CTA B/L GI: BS positive Extremity:  No Edema Skin:   Intact Neuro: Alert/Oriented, Abnormal Motor 3-/5 in L delt bi tri grip, 4/5 in L HF, KE, Ankle DF and Abnormal FMC Ataxic/ dec FMC Musc/Skel:  Normal   Assessment/Plan: 1. Functional deficits secondary to R frontal subcortical infarct with L HP which require 3+ hours per day of interdisciplinary therapy in a comprehensive inpatient rehab setting. Physiatrist is providing close team supervision and 24 hour management of active medical problems listed below. Physiatrist and rehab team continue to assess barriers to discharge/monitor patient progress toward functional and medical goals. FIM: FIM - Bathing Bathing Steps Patient Completed: Chest;Right Arm;Left Arm;Abdomen;Front perineal area;Buttocks;Right upper leg;Left upper leg Bathing:  4: Steadying assist  FIM - Upper Body Dressing/Undressing Upper body dressing/undressing steps patient completed: Thread/unthread right bra strap;Thread/unthread left bra strap;Hook/unhook bra;Thread/unthread right sleeve of pullover shirt/dresss;Pull shirt over trunk;Put head through opening of pull over shirt/dress;Thread/unthread left sleeve of pullover shirt/dress Upper body dressing/undressing: 5: Supervision: Safety issues/verbal cues FIM - Lower Body Dressing/Undressing Lower body dressing/undressing steps patient completed: Thread/unthread right underwear leg;Thread/unthread left underwear leg;Pull underwear up/down;Thread/unthread right pants leg;Thread/unthread left pants leg;Pull pants up/down  FIM - Toileting Toileting steps completed by patient: Adjust clothing prior to toileting;Performs perineal hygiene;Adjust clothing after toileting Toileting Assistive Devices: Grab bar or rail for support Toileting: 4: Steadying assist  FIM - Diplomatic Services operational officer Devices: Best boy Transfers: 4-To toilet/BSC: Min A (steadying Pt. > 75%);4-From toilet/BSC: Min A (steadying Pt. > 75%)  FIM - Bed/Chair Transfer Bed/Chair Transfer Assistive Devices: Therapist, occupational: 5: Supine > Sit: Supervision (verbal cues/safety issues)  FIM - Locomotion: Wheelchair Locomotion: Wheelchair: 0: Activity did not occur FIM - Locomotion: Ambulation Locomotion: Ambulation Assistive Devices: Designer, industrial/product Ambulation/Gait Assistance: 4: Min guard Locomotion: Ambulation: 4: Travels 150 ft or more with minimal assistance (Pt.>75%)  Comprehension Comprehension Mode: Auditory Comprehension: 5-Understands complex 90% of the time/Cues < 10% of the time  Expression Expression Mode: Verbal Expression: 5-Expresses complex 90% of the time/cues < 10% of the time  Social Interaction Social Interaction: 6-Interacts appropriately with others with medication or extra  time (anti-anxiety, antidepressant).  Problem Solving Problem Solving: 4-Solves basic 75 - 89% of the time/requires cueing 10 - 24% of the time  Memory Memory: 5-Recognizes or recalls 90% of the time/requires cueing < 10% of the time  Medical Problem List and Plan:  1. Right subcortical thrombotic infarct  2. DVT Prophylaxis/Anticoagulation: SCDs. Monitor for any signs of DVT  3. Hypothyroidism. Synthroid  4. Hyperlipidemia. Lipitor  5. Neuropsych: This patient is capable of making decisions on his/her own behalf.  6. Urinary incontinence with recurrent urinary tract infection. Continue Enablex and prophylactic Macrodantin. Check PVRs x3. Pt is followed by urology as an outpt.  7. Depression. Zoloft. Provide emotional support and positive reinforcement  8. CAD/CABG. No chest pain shortness of breath. Continue aspirin. Followup cardiology services as needed  LOS (Days) 2 A FACE TO FACE EVALUATION WAS PERFORMED  Edilia Ghuman E 11/19/2011, 9:04 AM

## 2011-11-19 NOTE — Patient Care Conference (Signed)
Inpatient RehabilitationTeam Conference Note Date: 11/18/2011   Time: 11:20AM    Patient Name: Gina Bowers      Medical Record Number: [id number]  Date of Birth: November 16, 1925 Sex: Female         Room/Bed: 4151/4151-01 Payor Info: Payor: MEDICARE  Plan: MEDICARE PART A AND B  Product Type: *No Product type*     Admitting Diagnosis: RT CVA  Admit Date/Time:  11/17/2011  1:28 PM Admission Comments: No comment available   Primary Diagnosis:  CVA (cerebral vascular accident) Principal Problem: CVA (cerebral vascular accident)  Patient Active Problem List   Diagnosis Date Noted  . CVA (cerebral vascular accident) 11/17/2011  . TIA (transient ischemic attack) 11/13/2011  . Weakness 11/13/2011  . OBSTRUCTIVE SLEEP APNEA 11/23/2008  . PULMONARY NODULE 11/23/2008  . Hypoxemia 11/23/2008  . HYPOTHYROIDISM 11/22/2008  . HYPERLIPIDEMIA 11/22/2008  . ANXIETY 11/22/2008  . STROKE 11/22/2008    Expected Discharge Date: Expected Discharge Date: 11/25/11  Team Members Present: Physician: Dr. Claudette Laws Case Manager Present: Lutricia Horsfall, RN Social Worker Present: Amada Jupiter, LCSW Nurse Present: Gregor Hams, RN PT Present: Edman Circle, Judith Blonder, PTA OT Present: Bretta Bang, Verlene Mayer, OT SLP Present: Fae Pippin, SLP PPS Coordinator: Tora Duck, RN    Current Status/Progress Goal Weekly Team Focus  Medical   initiating rehabilitation  assess activity tolerance, modified plan as needed  complete assessments as well as post discharge environment   Bowel/Bladder   Incontinent of bladder due to urgency. Contient of bowel. LBM 11/16/11, per pt's report  Continent of bowel and bladder.  Initiate time toileting   Swallow/Nutrition/ Hydration             ADL's             Mobility             Communication   Eval pending         Safety/Cognition/ Behavioral Observations  Eval pending         Pain   No c/o pain  <3  Monitor   Skin   clean, dry,  intact  clean, dry, intact  routine turn q 2hrs      *See Interdisciplinary Assessment and Plan and progress notes for long and short-term goals  Barriers to Discharge: patient lives alone prior to admission    Possible Resolutions to Barriers:  assess caregiver availability    Discharge Planning/Teaching Needs:       Pt lives in independent living facility.  Team Discussion:  New admit. Evals pending. Discussed pt's dx, goals and d/c plan.  Revisions to Treatment Plan:     Continued Need for Acute Rehabilitation Level of Care: The patient requires daily medical management by a physician with specialized training in physical medicine and rehabilitation for the following conditions: Daily direction of a multidisciplinary physical rehabilitation program to ensure safe treatment while eliciting the highest outcome that is of practical value to the patient.: Yes Daily medical management of patient stability for increased activity during participation in an intensive rehabilitation regime.: Yes Daily analysis of laboratory values and/or radiology reports with any subsequent need for medication adjustment of medical intervention for : Neurological problems  Meryl Dare 11/19/2011, 11:02 AM

## 2011-11-19 NOTE — Progress Notes (Addendum)
Physical Therapy Session Note  Patient Details  Name: Gina Bowers MRN: [id number] Date of Birth: 06/22/1925  Today's Date: 11/19/2011 Time: 0802-0902 Time Calculation (min): 60 min  Short Term Goals: Week 1:  PT Short Term Goal 1 (Week 1): Same as LTGs  Skilled Therapeutic Interventions/Progress Updates:  Dynamic sitting balance to don compression stockings and sandals, with supervision, EOB.  Gait training with Rollator walker x 100'  X 2 with supervision, focusing on safe use of walker. Min assist as she fatigued.  L foot clearance was marginal by end of walk, improved slightly with VCs.  neuromuscular re-education in sitting with feet and hands unsupported, for wt shifting L<>R with trunk shortening/lenthening for trunk and head righting.  Pt demonstrates core weakness by limited muscular endurance for these movements. Also, "walking" hips forward/backward, and in supine, 10 x 1 bil bridging.  Simulated car transfer to sedan ht seat with close supervision, VCs for hand placement.  Standing therapeutic ex: 10 x 1 each R and L hip flexion, calf raises, L hamstring curls, mini squats in L and R tandem foot position. Seated: bil scapular adduction with tactile cues to reduce shoulder elevation, for improved upright posture spine and head.     Therapy Documentation Precautions:  Precautions Precautions: Fall Precaution Comments: LOB with turns  Restrictions Weight Bearing Restrictions: No    Pain Assessment Pain Assessment: No/denies pain        See FIM for current functional status  Therapy/Group: Individual Therapy  [name] 11/19/2011, 9:13 AM

## 2011-11-19 NOTE — Progress Notes (Signed)
Occupational Therapy Session Note  Patient Details  Name: Gina Bowers MRN: [id number] Date of Birth: 31-May-1925  Today's Date: 11/19/2011 Time: 1105-1220 Time Calculation (min): 75 min  Short Term Goals: Week 1:  OT Short Term Goal 1 (Week 1): Short term goals equal to long term goals secondary to estimated length of stay  Skilled Therapeutic Interventions/Progress Updates:    Bathing ad dressing.  Pt utilized 4 wheeled walker for transfers and gathering clothes with min assist for initial sit to stand only.  Once standing just needed close supervision.  Able to perform all bathing sit to stand in the shower with min assist and increased time.  Uses the LUE at a diminished level for all selfcare tasks.  Performed grooming sit to stand at the sink using her walker to sit on.  Needs min instructional cueing to lock her walker brakes before sitting down.  Also worked on therapy putty exercises to increase hand strength and coordination.  Provided handout as well for reference.  Performed 5-6 repetitions for each exercise including gross grasp, tip to tip pinch, and digit extension.  Therapy Documentation Precautions:  Precautions Precautions: Fall Precaution Comments: LOB with turns  Restrictions Weight Bearing Restrictions: No  Pain: Pain Assessment Pain Assessment: No/denies pain ADL: See FIM for current functional status  Therapy/Group: Individual Therapy  Montana Fassnacht OTR/L 11/19/2011, 1:10 PM

## 2011-11-20 DIAGNOSIS — I6330 Cerebral infarction due to thrombosis of unspecified cerebral artery: Secondary | ICD-10-CM

## 2011-11-20 DIAGNOSIS — Z5189 Encounter for other specified aftercare: Secondary | ICD-10-CM

## 2011-11-20 NOTE — Progress Notes (Signed)
Occupational Therapy Session Note  Patient Details  Name: Gina Bowers MRN: [id number] Date of Birth: March 28, 1926  Today's Date: 11/20/2011 Time: 0900-1000 Time Calculation (min): 60 min  Short Term Goals: Week 1:  OT Short Term Goal 1 (Week 1): Short term goals equal to long term goals secondary to estimated length of stay  Skilled Therapeutic Interventions/Progress Updates:   Pt seen for BADL retraining of toileting, bathing, and dressing with a focus on dynamic standing balance, endurance, and use of LUE.  Pt only needed distant supervision with all tasks.  She was able to stand and reach down to feet to dry feet, transfer herself with rollater walker, and don all clothing including compression stockings with her left hand.  Improved balance and LUE use today.  No shortness of breath during ADLs.     Therapy Documentation Precautions:  Precautions Precautions: Fall Precaution Comments: LOB with turns  Restrictions Weight Bearing Restrictions: No Pain: Pain Assessment Pain Assessment: No/denies pain   See FIM for current functional status  Therapy/Group: Individual Therapy  Gwenlyn Hottinger 11/20/2011, 9:56 AM

## 2011-11-20 NOTE — Progress Notes (Signed)
Occupational Therapy Session Note  Patient Details  Name: Gina Bowers MRN: [id number] Date of Birth: 02/17/1926  Today's Date: 11/20/2011 Time: 4098-1191 Time Calculation (min): 75 min  Short Term Goals: Week 1:  OT Short Term Goal 1 (Week 1): Short term goals equal to long term goals secondary to estimated length of stay  Skilled Therapeutic Interventions/Progress Updates:  Addressed functional mobility, dynamic standing balance, LUE neuromuscular re education.  Pt. Utilized RW to ambulate to sink for grooming at Supervision level.  Walked to [employer] and engaged in Careers information officer using the stove.  Required verbal cues for walker safety.  Pt demonstrated good balance and was able to pick items off floor in standing as well as sitting.  She showed good endurance with leaning over and was not SOB. Utilized LUE to pick up popcorn with no problem.    Pt. Is very active at the [facility].  She exercises, does puzzles, bingo.   Therapy Documentation Precautions:  Precautions Precautions: Fall Precaution Comments: LOB with turns  Restrictions Weight Bearing Restrictions: No    O2 Device: None (Room air) Pain:  none   See FIM for current functional status  Therapy/Group: Individual Therapy  Humberto Seals 11/20/2011, 4:26 PM

## 2011-11-20 NOTE — Progress Notes (Signed)
Social Work  Social Work Assessment and Plan  Patient Details  Name: Gina Bowers MRN: [id number] Date of Birth: 21-Feb-1926  Today's Date: 11/20/2011  Problem List:  Patient Active Problem List  Diagnosis  . HYPOTHYROIDISM  . HYPERLIPIDEMIA  . ANXIETY  . OBSTRUCTIVE SLEEP APNEA  . STROKE  . PULMONARY NODULE  . Hypoxemia  . TIA (transient ischemic attack)  . Weakness  . CVA (cerebral vascular accident)   Past Medical History:  Past Medical History  Diagnosis Date  . Coronary artery disease     DR. Alanda Amass IS PT'S CARDIOLOGIST - HX CABG AND HAS STENT AND TOLD SHE HAS ANOTHER BLOCKAGE-BEING TREATED MEDICALLY  . Stroke     SEVERAL STROKES -TOLD SHE HAS CEREBELLUM BLOCKAGE AS RESULT OF STROKE--BUT NEUROLOGIST SAID HE DID NOT WANT TO DO ANY PROCEDURE BECAUSE OF HER AGE .  PT HAS SLIGHT LEFT FOOT DROP-DRAGS FOOT WHEN WALKING - USES WALKER  . High cholesterol   . Lung nodules     TOLD SHE HAS INTERSTITUAL LUNG DISEASE  . Shortness of breath     AND WHEEZING AT TIMES--NO INHALERS  . Vertigo   . Arthritis   . GERD (gastroesophageal reflux disease)   . Urinary incontinence   . UTI (lower urinary tract infection)     HX OF UTI'S-- LAST TIME COUPLE OF MONTHS AGO  . Hypothyroidism   . Blood transfusion 2007    AFTER HIP REPLACEMENT  . Fracture FEB 2013    FRACTURED LEFT ANKLE--NO SURGERY--WAS IN BOOT UNTIL COUPLE WEEKS AGO  . Depression   . Kidney stones     IN THE PAST  . Carotid bruit     PT'S DAUGHTER STATES PT HAD RECENT CAROTID STUDY AND WAS TOLD NO SIGNIFICANT BLOCKAGES  . [place] virus     COUPLE OF WEEKS AGO--ALL SYMTOMS RESOLVED PER PT   Past Surgical History:  Past Surgical History  Procedure Date  . Coronary angioplasty 10/2010    WITH STENT PLACEMENT  . Coronary artery bypass graft 2009  . Joint replacement 2007    HIP REPLACEMENT -LEFT  . Bladder tack     1969 AND 1990  . Cystoscopy with injection 09/21/2011    Procedure: CYSTOSCOPY WITH INJECTION;   Surgeon: Kathi Ludwig, MD;  Location: [facility];  Service: Urology;  Laterality: N/A;  Peri Urethral Macroplastique Injection and Estring Placement   Social History:  reports that she has never smoked. She has never used smokeless tobacco. She reports that she does not drink alcohol or use illicit drugs.  Family / Support Systems Marital Status: Widow/Widower How Long?: 2003 Patient Roles: Parent Children: daughter, Maciel Kegg @ [phone] Other Supports: also has a son, Casimiro Needle, living in [place] Anticipated Caregiver: daughter "checks in" with pt qd; staff at [place]/ [employer] and has private duty attendant who also provide support PRN Ability/Limitations of Caregiver: none Caregiver Availability: Intermittent (Pt must be modified independent to remain at [place] facility) Family Dynamics: Daughter very involved and supportive - arranging private duty support when she is not as available to keep a check in with patient.  Social History Preferred language: English Religion: Christian Cultural Background: NA Education: HS grad Read: Yes Write: Yes Employment Status: Retired Date Retired/Disabled/Unemployed: 43 Age Retired: 66  Fish farm manager Issues: none Guardian/Conservator: daughter is POA   Abuse/Neglect Physical Abuse: Denies Verbal Abuse: Denies Sexual Abuse: Denies Exploitation of patient/patient's resources: Denies Self-Neglect: Denies  Emotional Status Pt's affect, behavior adn adjustment status:  Pt very pleasant, talkative and enjoys speaking of all the activities she enjoys at her [place] facility and how pleased she is with her living situation (@ [place] since 10/12).  Denies any signficant emotional distress and no s/s of depression or anxiety.  Daughter also denies any signs of this - pt very motivated to regain her independence.  No formal depression screen indicated at this time - will monitor. Recent Psychosocial Issues: Move into [place]  apt 10/12 but has been very happy with this move. Pyschiatric History: none Substance Abuse History: none  Patient / Family Perceptions, Expectations & Goals Pt/Family understanding of illness & functional limitations: Pt and daughter with general understanding of this stroke (she has had prior CVAs which seemed to affect more of her vision and balance) and these current deficits. Premorbid pt/family roles/activities: Independent, elderly woman who does live in a care community but has maintained most of her independence.  Daughter providing support to pt as needed. Anticipated changes in roles/activities/participation: Little change anticipated as it appears pt is expected to reach modified independent goals Pt/family expectations/goals: to reach mod i goals and return to her [place] apartment  [employer]: Other (Comment) ([place] [facility]) Premorbid Home Care/DME Agencies: Other (Comment) (has used both [facility] and [place] in past) Transportation available at discharge: yes, either daughter or [place] transportation services  Discharge Planning Living Arrangements: Alone Support Systems: Children;Other (Comment) (staff at [employer]) Type of Residence: Other (Comment) (Independent Living apartment) Insurance Resources: Electrical engineer Resources: Restaurant manager, fast food Screen Referred: No Living Expenses: Psychologist, sport and exercise Management: Family Do you have any problems obtaining your medications?: No Home Management: pt and staff Patient/Family Preliminary Plans: Pt plans to return to her independent living apt Social Work Anticipated Follow Up Needs: HH/OP Expected length of stay: one week  Clinical Impression Very pleasant, oriented, elderly woman who is making good functional gains and anticipates being able to return to her [place] apt.  Supportive daughter living locally.  No significant emotional distress noted but will monitor.  Matraca Hunkins,  Berklee Battey 11/20/2011, 4:11 PM

## 2011-11-20 NOTE — Progress Notes (Signed)
Patient ID: Gina Bowers, female   DOB: May 04, 1926, 78 y.o.   MRN: [id number]  Subjective/Complaints:  Gina Bowers is a 78 y.o. right-handed female with history of coronary artery disease as well as multiple strokes. Patient is a resident of [facility] independent living. Admitted 11/13/2011 with acute onset left-sided weakness. Per family member she stood up and used her walker to get back to bed when caregiver noted left facial droop and left-sided weakness. She has recovered nicely from history of previous strokes. She had been placed on aspirin/ Plavix. MRI of the brain showed 10 mm acute infarct in the deep cerebral white matter on the right. MRA of the head with diffuse intracranial atherosclerotic disease as well as occluded left vertebral artery. Echocardiogram with ejection fraction of 60% grade 1 diastolic dysfunction. Carotid Doppler with no ICA stenosis. Neurology services consulted presently patient remains on aspirin and Plavix therapy.  Encouraged in PT/OT  Review of Systems  Gastrointestinal: Positive for constipation.  Neurological: Positive for focal weakness.  All other systems reviewed and are negative.   Objective: Vital Signs: Blood pressure 116/72, pulse 59, temperature 98 F (36.7 C), temperature source Oral, resp. rate 18, height 5\' 2"  (1.575 m), weight 74.617 kg (164 lb 8 oz), SpO2 91.00%. No results found. Results for orders placed during the hospital encounter of 11/17/11 (from the past 72 hour(s))  CBC     Status: Abnormal   Collection Time   11/18/11  5:00 AM      Component Value Range Comment   WBC 7.0  4.0 - 10.5 K/uL    RBC 3.77 (*) 3.87 - 5.11 MIL/uL    Hemoglobin 12.3  12.0 - 15.0 g/dL    HCT 40.9 (*) 81.1 - 46.0 %    MCV 93.4  78.0 - 100.0 fL    MCH 32.6  26.0 - 34.0 pg    MCHC 34.9  30.0 - 36.0 g/dL    RDW 91.4  78.2 - 95.6 %    Platelets 193  150 - 400 K/uL   COMPREHENSIVE METABOLIC PANEL     Status: Abnormal   Collection Time   11/18/11  5:00 AM      Component Value Range Comment   Sodium 137  135 - 145 mEq/L    Potassium 4.0  3.5 - 5.1 mEq/L    Chloride 100  96 - 112 mEq/L    CO2 27  19 - 32 mEq/L    Glucose, Bld 93  70 - 99 mg/dL    BUN 16  6 - 23 mg/dL    Creatinine, Ser 2.13 (*) 0.50 - 1.10 mg/dL    Calcium 9.2  8.4 - 08.6 mg/dL    Total Protein 6.2  6.0 - 8.3 g/dL    Albumin 3.2 (*) 3.5 - 5.2 g/dL    AST 17  0 - 37 U/L    ALT 15  0 - 35 U/L    Alkaline Phosphatase 63  39 - 117 U/L    Total Bilirubin 0.4  0.3 - 1.2 mg/dL    GFR calc non Af Amer 87 (*) >90 mL/min    GFR calc Af Amer >90  >90 mL/min   DIFFERENTIAL     Status: Normal   Collection Time   11/18/11  5:00 AM      Component Value Range Comment   Neutrophils Relative 50  43 - 77 %    Neutro Abs 3.5  1.7 - 7.7 K/uL  Lymphocytes Relative 39  12 - 46 %    Lymphs Abs 2.7  0.7 - 4.0 K/uL    Monocytes Relative 7  3 - 12 %    Monocytes Absolute 0.5  0.1 - 1.0 K/uL    Eosinophils Relative 3  0 - 5 %    Eosinophils Absolute 0.2  0.0 - 0.7 K/uL    Basophils Relative 0  0 - 1 %    Basophils Absolute 0.0  0.0 - 0.1 K/uL      HEENT: normal Cardio: RRR Resp: CTA B/L GI: BS positive Extremity:  No Edema Skin:   Intact Neuro: Alert/Oriented, Abnormal Motor 3-/5 in L delt bi tri grip, 4/5 in L HF, KE, Ankle DF and Abnormal FMC Ataxic/ dec FMC Musc/Skel:  Normal   Assessment/Plan: 1. Functional deficits secondary to R frontal subcortical infarct with L HP which require 3+ hours per day of interdisciplinary therapy in a comprehensive inpatient rehab setting. Physiatrist is providing close team supervision and 24 hour management of active medical problems listed below. Physiatrist and rehab team continue to assess barriers to discharge/monitor patient progress toward functional and medical goals. FIM: FIM - Bathing Bathing Steps Patient Completed: Chest;Right Arm;Left Arm;Abdomen;Front perineal area;Buttocks;Right upper leg;Left upper leg;Right  lower leg (including foot);Left lower leg (including foot) Bathing: 4: Steadying assist  FIM - Upper Body Dressing/Undressing Upper body dressing/undressing steps patient completed: Thread/unthread right bra strap;Thread/unthread left bra strap;Hook/unhook bra;Thread/unthread right sleeve of pullover shirt/dresss;Thread/unthread left sleeve of pullover shirt/dress;Put head through opening of pull over shirt/dress;Pull shirt over trunk Upper body dressing/undressing: 5: Supervision: Safety issues/verbal cues FIM - Lower Body Dressing/Undressing Lower body dressing/undressing steps patient completed: Thread/unthread right underwear leg;Thread/unthread left underwear leg;Pull underwear up/down;Thread/unthread right pants leg;Thread/unthread left pants leg;Pull pants up/down;Don/Doff right sock;Don/Doff left sock Lower body dressing/undressing: 4: Steadying Assist  FIM - Toileting Toileting steps completed by patient: Adjust clothing prior to toileting Toileting Assistive Devices: Grab bar or rail for support Toileting: 4: Steadying assist  FIM - Diplomatic Services operational officer Devices: Best boy Transfers: 4-To toilet/BSC: Min A (steadying Pt. > 75%);4-From toilet/BSC: Min A (steadying Pt. > 75%)  FIM - Bed/Chair Transfer Bed/Chair Transfer Assistive Devices: Therapist, occupational: 5: Supine > Sit: Supervision (verbal cues/safety issues);4: Bed > Chair or W/C: Min A (steadying Pt. > 75%)  FIM - Locomotion: Wheelchair Locomotion: Wheelchair: 0: Activity did not occur FIM - Locomotion: Ambulation Locomotion: Ambulation Assistive Devices: Designer, industrial/product Ambulation/Gait Assistance: 4: Min guard Locomotion: Ambulation: 2: Travels 50 - 149 ft with minimal assistance (Pt.>75%)  Comprehension Comprehension Mode: Auditory Comprehension: 5-Understands complex 90% of the time/Cues < 10% of the time  Expression Expression Mode: Verbal Expression: 5-Expresses  complex 90% of the time/cues < 10% of the time  Social Interaction Social Interaction: 6-Interacts appropriately with others with medication or extra time (anti-anxiety, antidepressant).  Problem Solving Problem Solving: 4-Solves basic 75 - 89% of the time/requires cueing 10 - 24% of the time  Memory Memory: 5-Recognizes or recalls 90% of the time/requires cueing < 10% of the time  Medical Problem List and Plan:  1. Right subcortical thrombotic infarct  2. DVT Prophylaxis/Anticoagulation: SCDs. Monitor for any signs of DVT  3. Hypothyroidism. Synthroid  4. Hyperlipidemia. Lipitor  5. Neuropsych: This patient is capable of making decisions on his/her own behalf.  6. Urinary incontinence with recurrent urinary tract infection. Continue Enablex and prophylactic Macrodantin. Check PVRs x3. Pt is followed by urology as an outpt.  7. Depression.  Zoloft. Provide emotional support and positive reinforcement  8. CAD/CABG. No chest pain shortness of breath. Continue aspirin. Followup cardiology services as needed       LOS (Days) 3 A FACE TO FACE EVALUATION WAS PERFORMED  Mikaila Grunert E 11/20/2011, 7:28 AM

## 2011-11-20 NOTE — Progress Notes (Signed)
Physical Therapy Session Note  Patient Details  Name: Gina Bowers MRN: [id number] Date of Birth: 03-01-1926  Today's Date: 11/20/2011 Time: 1001-1100 Time Calculation (min): 59 min  Short Term Goals: Week 1:  PT Short Term Goal 1 (Week 1): Same as LTGs  Skilled Therapeutic Interventions/Progress Updates:    Therapeutic exercise performed with LE to increase strength for functional mobility. [place] B performed with 3 # on RLE and 2 # on LLE 10 x except seated LAQ 15 x 2; exercise for balance and fall prevention also.  Gait training working on swing phase of LLE as it is slightly prolonged and decreased L foot clearance.  Uses rollator but is able to let go of it and turn to sit safely.  Car transfer mod I Therapy Documentation Precautions:  Precautions Precautions: Fall Restrictions Weight Bearing Restrictions: No General: Chart Reviewed: Yes Family/Caregiver Present: No Vital Signs: Pulse Rate: 60  (with activity) Resp: 18  BP: 131/75 mmHg (after activity)  Oxygen Therapy SpO2: 95 % O2 Device: None (Room air) Pain: Pain Assessment Pain Assessment: No/denies pain Mobility: Transfers Sit to Stand: 7: Independent Stand to Sit: 7: Independent Locomotion : Ambulation Ambulation/Gait Assistance: 5: Supervision Assistive device: Rollator Ambulation/Gait Assistance Details: Verbal cues for precautions/safety;Verbal cues for technique Ambulation/Gait Assistance Details: intermittent cues to increase clearance L foot in swing, especially with conversation/diviided attention Stairs / Additional Locomotion Stairs: Yes Stairs Assistance: 5: Supervision Stairs Assistance Details (indicate cue type and reason): some step to pattern initially but progressed to all reciprocal steps- increased reliance on UE's Stair Management Technique: Two rails Number of Stairs: 10      10 steps with B rails S increased reilance on UE's, mostly reciprocal pattern Balance: mod I with  rollator, S without   Exercises: General Exercises - Lower Extremity Repetitive Sit to Stands: No upper extremities;20 reps Repetitive Sit to Stands Level of Assist: Other (comment) (none) Other Exercises Other Exercises: [place] B Other Treatments: Treatments Therapeutic Activity: car transfer with rollator mod I  See FIM for current functional status  Therapy/Group: Individual Therapy  Michaelene Song 11/20/2011, 10:54 AM

## 2011-11-21 NOTE — Progress Notes (Signed)
Occupational Therapy Session Note  Patient Details  Name: Gina Bowers MRN: [id number] Date of Birth: 10-03-1925  Today's Date: 11/21/2011 Time: 9562-1308 Time Calculation (min): 45 min   Skilled Therapeutic Interventions/Progress Updates: ADL on rollater at sink with focus on balance and safety with distractions in the room and functional mobility such as safety with turns, etc while gathering clothing before bath, etc     Therapy Documentation Precautions: Fall   Pain:denied   See FIM for current functional status  Therapy/Group: Individual Therapy  Bud Face [facility] 11/21/2011, 4:11 PM

## 2011-11-21 NOTE — Progress Notes (Signed)
Patient ID: ROWYN MUSTAPHA, female   DOB: [birth year], 78 y.o.   MRN: [id number]  Subjective/Complaints:  Gina Bowers is a 78 y.o. right-handed female with history of coronary artery disease as well as multiple strokes. Patient is a resident of [facility] independent [facility]. Admitted 11/13/2011 with acute onset left-sided weakness. Per family member she stood up and used her walker to get back to bed when caregiver noted left facial droop and left-sided weakness. She has recovered nicely from history of previous strokes. She had been placed on aspirin/ Plavix. MRI of the brain showed 10 mm acute infarct in the deep cerebral white matter on the right. MRA of the head with diffuse intracranial atherosclerotic disease as well as occluded left vertebral artery. Echocardiogram with ejection fraction of 60% grade 1 diastolic dysfunction. Carotid Doppler with no ICA stenosis. Neurology services consulted presently patient remains on aspirin and Plavix therapy.  Encouraged in PT/OT  Review of Systems  Gastrointestinal: Positive for constipation.  Neurological: Positive for focal weakness.  All other systems reviewed and are negative.   Objective: Vital Signs: Blood pressure 130/75, pulse 58, temperature 98 F (36.7 C), temperature source Oral, resp. rate 18, height 5\' 2"  (1.575 m), weight 74.617 kg (164 lb 8 oz), SpO2 94.00%. No results found. No results found for this or any previous visit (from the past 72 hour(s)).   HEENT: normal Cardio: RRR Resp: CTA B/L GI: BS positive Extremity:  No Edema Skin:   Intact Neuro: Alert/Oriented, Abnormal Motor 3-/5 in L delt bi tri grip, 4/5 in L HF, KE, Ankle DF and Abnormal FMC Ataxic/ dec FMC Musc/Skel:  Normal   Assessment/Plan: 1. Functional deficits secondary to R frontal subcortical infarct with L HP which require 3+ hours per day of interdisciplinary therapy in a comprehensive inpatient rehab setting. Physiatrist is providing close team  supervision and 24 hour management of active medical problems listed below. Physiatrist and rehab team continue to assess barriers to discharge/monitor patient progress toward functional and medical goals. FIM: FIM - Bathing Bathing Steps Patient Completed: Chest;Right Arm;Left Arm;Abdomen;Front perineal area;Buttocks;Right upper leg;Left upper leg;Right lower leg (including foot);Left lower leg (including foot) Bathing: 5: Supervision: Safety issues/verbal cues  FIM - Upper Body Dressing/Undressing Upper body dressing/undressing steps patient completed: Thread/unthread right bra strap;Thread/unthread left bra strap;Hook/unhook bra;Thread/unthread right sleeve of pullover shirt/dresss;Thread/unthread left sleeve of pullover shirt/dress;Put head through opening of pull over shirt/dress;Pull shirt over trunk Upper body dressing/undressing: 5: Supervision: Safety issues/verbal cues FIM - Lower Body Dressing/Undressing Lower body dressing/undressing steps patient completed: Thread/unthread right underwear leg;Thread/unthread left underwear leg;Pull underwear up/down;Thread/unthread right pants leg;Thread/unthread left pants leg;Pull pants up/down;Don/Doff right sock;Don/Doff left sock Lower body dressing/undressing: 5: Supervision: Safety issues/verbal cues  FIM - Toileting Toileting steps completed by patient: Adjust clothing prior to toileting Toileting Assistive Devices: Grab bar or rail for support Toileting: 5: Supervision: Safety issues/verbal cues  FIM - Diplomatic Services operational officer Devices: Art gallery manager Transfers: 5-To toilet/BSC: Supervision (verbal cues/safety issues);5-From toilet/BSC: Supervision (verbal cues/safety issues)  FIM - Banker Devices: Therapist, occupational: 6: Chair or W/C > Bed: No assist;6: Sit > Supine: No assist  FIM - Locomotion: Wheelchair Locomotion: Wheelchair: 0: Activity did not occur FIM -  Locomotion: Ambulation Locomotion: Ambulation Assistive Devices: Designer, industrial/product Ambulation/Gait Assistance: 5: Supervision Locomotion: Ambulation: 2: Travels 50 - 149 ft with supervision/safety issues  Comprehension Comprehension Mode: Auditory Comprehension: 6-Follows complex conversation/direction: With extra time/assistive device  Expression Expression Mode: Verbal Expression:  6-Expresses complex ideas: With extra time/assistive device  Social Interaction Social Interaction: 6-Interacts appropriately with others with medication or extra time (anti-anxiety, antidepressant).  Problem Solving Problem Solving: 5-Solves basic problems: With no assist  Memory Memory: 5-Recognizes or recalls 90% of the time/requires cueing < 10% of the time  Medical Problem List and Plan:  1. Right subcortical thrombotic infarct  2. DVT Prophylaxis/Anticoagulation: SCDs. Monitor for any signs of DVT  3. Hypothyroidism. Synthroid  4. Hyperlipidemia. Lipitor  5. Neuropsych: This patient is capable of making decisions on his/her own behalf.  6. Urinary incontinence with recurrent urinary tract infection. Continue Enablex and prophylactic Macrodantin. Check PVRs x3. Pt is followed by urology as an outpt.  7. Depression. Zoloft. Provide emotional support and positive reinforcement  8. CAD/CABG. No chest pain shortness of breath. Continue aspirin. Followup cardiology services as needed       LOS (Days) 4 A FACE TO FACE EVALUATION WAS PERFORMED  Gina Bowers E 11/21/2011, 10:36 AM

## 2011-11-22 NOTE — Progress Notes (Signed)
Physical Therapy Note  Patient Details  Name: MERANDA DECHAINE MRN: [id number] Date of Birth: 1925/09/21 Today's Date: 11/22/2011  4098-1191 (55 minutes) individual Pain: no complaint of pain Focus of treatment: Bilateral LE exercises/ Gait training-endurance/ Therapeutic activities focused on protective balance reactions Treatment: Gait to/from gym 120 feet X 2 RW (rollator) SBA ; Reviewed written,illustrated [facility]) exercises X 10 in standing and sitting using parallel bar for support SBA; Biodex hip/ankle strategy (limits of stability) exercises without UE support SBA (pt able to perform without loss of balance or cueing); Gait endurance - 200 feet RW SBA with 2/4 dyspnea.  Wayden Schwertner,JIM 11/22/2011, 9:56 AM

## 2011-11-22 NOTE — Progress Notes (Signed)
Patient ID: Gina Bowers, female   DOB: [birth year], 78 y.o.   MRN: [id number]  Subjective/Complaints:  Gina Bowers is a 78 y.o. right-handed female with history of coronary artery disease as well as multiple strokes. Patient is a resident of [facility] independent [facility]. Admitted 11/13/2011 with acute onset left-sided weakness. Per family member she stood up and used her walker to get back to bed when caregiver noted left facial droop and left-sided weakness. She has recovered nicely from history of previous strokes. She had been placed on aspirin/ Plavix. MRI of the brain showed 10 mm acute infarct in the deep cerebral white matter on the right. MRA of the head with diffuse intracranial atherosclerotic disease as well as occluded left vertebral artery. Echocardiogram with ejection fraction of 60% grade 1 diastolic dysfunction. Carotid Doppler with no ICA stenosis. Neurology services consulted presently patient remains on aspirin and Plavix therapy.  Slept well no issues this am  Review of Systems  Gastrointestinal: Positive for constipation.  Neurological: Positive for focal weakness.  All other systems reviewed and are negative.   Objective: Vital Signs: Blood pressure 141/71, pulse 61, temperature 97.9 F (36.6 C), temperature source Oral, resp. rate 16, height 5\' 2"  (1.575 m), weight 74.617 kg (164 lb 8 oz), SpO2 91.00%. No results found. No results found for this or any previous visit (from the past 72 hour(s)).   HEENT: normal Cardio: RRR Resp: CTA B/L GI: BS positive Extremity:  No Edema Skin:   Intact Neuro: Alert/Oriented, Abnormal Motor 3-/5 in L delt bi tri grip, 4/5 in L HF, KE, Ankle DF and Abnormal FMC Ataxic/ dec FMC Musc/Skel:  Normal   Assessment/Plan: 1. Functional deficits secondary to R frontal subcortical infarct with L HP which require 3+ hours per day of interdisciplinary therapy in a comprehensive inpatient rehab setting. Physiatrist is providing  close team supervision and 24 hour management of active medical problems listed below. Physiatrist and rehab team continue to assess barriers to discharge/monitor patient progress toward functional and medical goals. FIM: FIM - Bathing Bathing Steps Patient Completed: Chest;Right Arm;Left Arm;Abdomen;Front perineal area;Buttocks;Right upper leg;Left upper leg;Right lower leg (including foot);Left lower leg (including foot) (standing and with rollater at sink) Bathing: 5: Supervision: Safety issues/verbal cues  FIM - Upper Body Dressing/Undressing Upper body dressing/undressing steps patient completed: Thread/unthread left sleeve of pullover shirt/dress;Put head through opening of pull over shirt/dress;Thread/unthread right sleeve of pullover shirt/dresss;Pull shirt over trunk Upper body dressing/undressing: 5: Supervision: Safety issues/verbal cues FIM - Lower Body Dressing/Undressing Lower body dressing/undressing steps patient completed: Thread/unthread right underwear leg;Thread/unthread left underwear leg;Pull underwear up/down;Thread/unthread right pants leg;Thread/unthread left pants leg;Pull pants up/down;Fasten/unfasten pants;Don/Doff right sock;Don/Doff left sock;Don/Doff right shoe;Don/Doff left shoe;Fasten/unfasten right shoe;Fasten/unfasten left shoe Lower body dressing/undressing: 5: Supervision: Safety issues/verbal cues  FIM - Toileting Toileting steps completed by patient: Adjust clothing prior to toileting;Performs perineal hygiene;Adjust clothing after toileting Toileting Assistive Devices: Grab bar or rail for support Toileting: 5: Supervision: Safety issues/verbal cues  FIM - Diplomatic Services operational officer Devices: Art gallery manager Transfers: 5-To toilet/BSC: Supervision (verbal cues/safety issues);5-From toilet/BSC: Supervision (verbal cues/safety issues)  FIM - Banker Devices: Therapist, occupational: 7: Independent:  No helper;6: More than reasonable amt of time;6: Bed > Chair or W/C: No assist  FIM - Locomotion: Wheelchair Locomotion: Wheelchair: 0: Activity did not occur FIM - Locomotion: Ambulation Locomotion: Ambulation Assistive Devices: Designer, industrial/product (rollator) Ambulation/Gait Assistance: 5: Supervision Locomotion: Ambulation: 5: Travels 150 ft or more with  supervision/safety issues  Comprehension Comprehension Mode: Auditory Comprehension: 6-Follows complex conversation/direction: With extra time/assistive device  Expression Expression Mode: Verbal Expression: 6-Expresses complex ideas: With extra time/assistive device  Social Interaction Social Interaction: 6-Interacts appropriately with others with medication or extra time (anti-anxiety, antidepressant).  Problem Solving Problem Solving: 5-Solves basic problems: With no assist  Memory Memory: 5-Recognizes or recalls 90% of the time/requires cueing < 10% of the time  Medical Problem List and Plan:  1. Right subcortical thrombotic infarct  2. DVT Prophylaxis/Anticoagulation: SCDs. Monitor for any signs of DVT  3. Hypothyroidism. Synthroid  4. Hyperlipidemia. Lipitor  5. Neuropsych: This patient is capable of making decisions on his/her own behalf.  6. Urinary incontinence with recurrent urinary tract infection. Continue Enablex and prophylactic Macrodantin. Check PVRs x3. Pt is followed by urology as an outpt.  7. Depression. Zoloft. Provide emotional support and positive reinforcement  8. CAD/CABG. No chest pain shortness of breath. Continue aspirin. Followup cardiology services as needed       LOS (Days) 5 A FACE TO FACE EVALUATION WAS PERFORMED  Gina Bowers 11/22/2011, 10:30 AM

## 2011-11-22 NOTE — Progress Notes (Addendum)
Physical Therapy Note  Patient Details  Name: Gina Bowers MRN: [id number] Date of Birth: [birth year] Today's Date: 11/22/2011  1435-1515 (30 minutes) individual Pain: no complaint of pain Focus of treatment: Therapeutic activity focused on dynamic standing balance/standing tolerance Treatment: Pt participated Wii bowling game X 2 . Pt requires close supervision and unilateral UE support to maintain dynamic standing balance during this activity with seated rest break between games. Gait to room 120 feet rollator SBA with increased foot drag on left with fatigue.   Temprance Wyre,JIM 11/22/2011, 3:14 PM

## 2011-11-22 NOTE — Progress Notes (Signed)
Occupational Therapy Session Note  Patient Details  Name: Gina Bowers MRN: [id number] Date of Birth: 10-12-1925  Today's Date: 11/22/2011 Time: 1345-1445 and 5621-3086 Time Calculation (min): 60 min and 45 min=105 Total minutes  Skilled Therapeutic Interventions/Progress Updates: AM:  ADL.  In room shower via grab bar and tub transfer bench with focus on dynamic balance and crossing legs over knees to wash and dress feet and safe functional mobility via rollater for clothing and toiletry retrieval and set up and cleanup                       PM:   Nu step work load #5 for about 5 to 6 min repeptitions to increase unilateral coordination for continued safety and balance for safe care and functional mobility; Left hand grip strengthening and digital coordination and strength training     Therapy Documentation   Pain:denied   See FIM for current functional status  Therapy/Group: Individual Therapy  [name] 11/22/2011, 2:53 PM

## 2011-11-23 DIAGNOSIS — Z5189 Encounter for other specified aftercare: Secondary | ICD-10-CM

## 2011-11-23 DIAGNOSIS — I6330 Cerebral infarction due to thrombosis of unspecified cerebral artery: Secondary | ICD-10-CM

## 2011-11-23 DIAGNOSIS — G8110 Spastic hemiplegia affecting unspecified side: Secondary | ICD-10-CM

## 2011-11-23 NOTE — Progress Notes (Signed)
Physical Therapy Note  Patient Details  Name: Gina Bowers MRN: [id number] Date of Birth: [birth year] Today's Date: 11/23/2011  8:30 - 9:30 individual therapy pt denied pain  Gait controlled environment 150' x 2 mod I with rollator walker, 12 steps with 2 rails supervision. Pt was educated to get min assist of she had to perform steps without rails in community. Car transfer supervision with assist to open door. Pt was made mod I in room.   Julian Reil 11/23/2011, 12:17 PM

## 2011-11-23 NOTE — Progress Notes (Signed)
Per State Regulation 482.30 This chart was reviewed for medical necessity with respect to the patient's Admission/Duration of stay. Participating in therapies and ready for d/c tomorrow at mod I level. Meryl Dare                 Nurse Care Manager

## 2011-11-23 NOTE — Progress Notes (Signed)
Physical Therapy Discharge Summary  Patient Details  Name: Gina Bowers MRN: [id number] Date of Birth: June 15, 1925  Today's Date: 11/23/2011 Time: 3086-5784 Time Calculation (min): 54 min  Patient has met 7 of 7 long term goals due to improved activity tolerance, improved balance and increased strength.  Patient to discharge at an ambulatory level Modified Independent.   Pt will dc to independent living at mod I level using rollator walker and supervision for the community.   Reasons goals not met: n/a  Recommendation:  Patient will benefit from ongoing skilled PT services in home health setting to continue to advance safe functional mobility, address ongoing impairments in functional gait and balannce, and minimize fall risk.  Equipment: No equipment provided  Reasons for discharge: discharge from hospital  Patient/family agrees with progress made and goals achieved: Yes  Family ed completed with daughter, daughter's concerns included how pt would do grooming at her sink, how would pick up her laundry basket, fall risk, and pt parking RW to walk short distances without.  All concerns addressed with daughter and pt verbalized understanding.  PT Discharge Precautions/Restrictions  Fall, Berg= 44, improved by 7 points since admission. Vital Signs  Pain  No pain Cognition Overall Cognitive Status: Appears within functional limits for tasks assessed Sensation Sensation Stereognosis: Appears Intact Hot/Cold: Appears Intact Coordination Gross Motor Movements are Fluid and Coordinated: Yes Fine Motor Movements are Fluid and Coordinated: Yes Motor  Motor Motor - Skilled Clinical Observations: Pt is now able to use LUE in all fine motor tasks, such as donning TED hose, fastening buttons, opening containers.  Mobility Bed Mobility Rolling Right: 6: Modified independent (Device/Increase time) Right Sidelying to Sit: 6: Modified independent (Device/Increase time) Supine to Sit: 6:  Modified independent (Device/Increase time) Sit to Supine: 6: Modified independent (Device/Increase time) Transfers Sit to Stand: 6: Modified independent (Device/Increase time) Stand to Sit: 6: Modified independent (Device/Increase time) Locomotion   Gait with rollator in hospital and community setting 1000+' mod I. Trunk/Postural Assessment  Cervical Assessment Cervical Assessment: Within Functional Limits Thoracic Assessment Thoracic Assessment: Exceptions to [facility] Lumbar Assessment Lumbar Assessment: Within Functional Limits Postural Control Postural Control: Within Functional Limits  Balance Standardized Balance Assessment Standardized Balance Assessment: Berg Balance Test Berg Balance Test Sit to Stand: Able to stand without using hands and stabilize independently Standing Unsupported: Able to stand safely 2 minutes Sitting with Back Unsupported but Feet Supported on Floor or Stool: Able to sit safely and securely 2 minutes Stand to Sit: Sits safely with minimal use of hands Transfers: Able to transfer safely, definite need of hands (only needed UEs to push up from low surface.) Standing Unsupported with Eyes Closed: Able to stand 10 seconds safely Standing Ubsupported with Feet Together: Able to place feet together independently and stand 1 minute safely From Standing, Reach Forward with Outstretched Arm: Can reach confidently >25 cm (10") From Standing Position, Pick up Object from Floor: Able to pick up shoe safely and easily From Standing Position, Turn to Look Behind Over each Shoulder: Looks behind from both sides and weight shifts well Turn 360 Degrees: Able to turn 360 degrees safely but slowly Standing Unsupported, Alternately Place Feet on Step/Stool: Able to complete >2 steps/needs minimal assist Standing Unsupported, One Foot in Front: Needs help to step but can hold 15 seconds Standing on One Leg: Tries to lift leg/unable to hold 3 seconds but remains standing  independently Total Score: 44  Extremity Assessment      RLE Assessment RLE Assessment:  Not tested (no change) LLE Assessment LLE Assessment:  (no change)  See FIM for current functional status  Gina Bowers 11/23/2011, 4:05 PM

## 2011-11-23 NOTE — Progress Notes (Signed)
Occupational Therapy Session Note  Patient Details  Name: SHADAE REINO MRN: [id number] Date of Birth: 1926/03/24  Today's Date: 11/23/2011 Time: 1000-1045 Time Calculation (min): 45 min  Short Term Goals: Week 1:  OT Short Term Goal 1 (Week 1): Short term goals equal to long term goals secondary to estimated length of stay  Skilled Therapeutic Interventions:  Due to patient's LUE weakness, provided shoulder Theraband exercises and Gold Theraband and reviewed Theraputty exercises issued by a previous therapist.  All exercises were demonstrated then independently demonstrated by patient.  Patient stated that she had no questions and felt prepared for discharge tomorrow.     Therapy Documentation Precautions:  Precautions Precautions: Fall Precaution Comments: LOB with turns  Restrictions Weight Bearing Restrictions: No  Pain: No c/o pain  See FIM for current functional status  Therapy/Group: Individual Therapy  Paislyn Domenico 11/23/2011, 4:17 PM

## 2011-11-23 NOTE — Progress Notes (Signed)
Occupational Therapy Discharge Summary  Patient Details  Name: Gina Bowers MRN: [id number] Date of Birth: 1926/03/14  Today's Date: 11/23/11    Patient has met 11 of 11 long term goals due to improved activity tolerance, improved balance, functional use of  LEFT upper extremity and improved coordination.  Patient to discharge at overall Modified Independent level.  Patient  Resides in an independent living facility that provides her with meals and housekeeping.  She is only responsible for light meals such as snacks.  Reasons goals not met: n/a  Recommendation:  Patient will benefit from continuing with her exercise group that she attends at her independent living facility.  Equipment: No equipment provided  Reasons for discharge: treatment goals met  Patient/family agrees with progress made and goals achieved: Yes  OT Discharge Precautions/Restrictions  Precautions Precautions: Fall Restrictions Weight Bearing Restrictions: No  Pain Pain Assessment Pain Assessment: No/denies pain Pain Score: 0-No pain ADL Mod I overall (independent with eating, grooming, and UB dressing)   Vision/Perception  Vision - History Baseline Vision: Bifocals Patient Visual Report: No change from baseline Vision - Assessment Eye Alignment: Within Functional Limits Perception Perception: Within Functional Limits Praxis Praxis: Intact  Cognition Overall Cognitive Status: Appears within functional limits for tasks assessed Orientation Level: Oriented X4 Sensation Sensation Light Touch: Appears Intact Stereognosis: Appears Intact Hot/Cold: Appears Intact Proprioception: Appears Intact Coordination Gross Motor Movements are Fluid and Coordinated: Yes Fine Motor Movements are Fluid and Coordinated: Yes Motor  Motor Motor: Hemiplegia Motor - Skilled Clinical Observations: Pt is now able to use LUE in all fine motor tasks, such as donning TED hose, fastening buttons, opening  containers. Motor - Discharge Observations: no changefrom evaluation Mobility  Bed Mobility Rolling Right: 6: Modified independent (Device/Increase time) Right Sidelying to Sit: 6: Modified independent (Device/Increase time) Supine to Sit: 6: Modified independent (Device/Increase time) Sit to Supine: 6: Modified independent (Device/Increase time) Transfers Sit to Stand: 6: Modified independent (Device/Increase time) Stand to Sit: 6: Modified independent (Device/Increase time)  Trunk/Postural Assessment  Cervical Assessment Cervical Assessment: Within Functional Limits Thoracic Assessment Thoracic Assessment: Exceptions to [place] Lumbar Assessment Lumbar Assessment: Within Functional Limits Postural Control Postural Control: Within Functional Limits  Balance Static Sitting Balance Static Sitting - Level of Assistance: 7: Independent Dynamic Sitting Balance Dynamic Sitting - Level of Assistance: 7: Independent Dynamic Standing Balance Dynamic Standing - Level of Assistance: 6: Modified independent (Device/Increase time) Extremity/Trunk Assessment RUE Assessment RUE Assessment: Within Functional Limits LUE AROM (degrees) LUE Overall AROM Comments: 3+/5 shoulder, 4-/5 elbow flex/ext, 15 lbs grip strength (right hand 45 lbs.)  See FIM for current functional status  Griffin Dewilde 11/23/2011, 10:06 AM

## 2011-11-23 NOTE — Discharge Summary (Signed)
Physician Discharge Summary  Patient ID: Gina Bowers MRN: [id number] DOB/AGE: [birth year] 76 y.o.  Admit date: 11/17/2011 Discharge date: 11/23/2011  Admission Diagnoses:CVA  Discharge Diagnoses:  Principal Problem:  *CVA (cerebral vascular accident)   Discharged Condition: good  Hospital Course: the patient was admitted with some left-sided weakness. An MRI of the brain showed deep right cerebral white matter CVA. MRA showed occluded leftvertebralartery. The patient was treated medically. She had improvement in her neurologic symptoms. She was seen by physical therapy occupational therapy and deemed a good candidate for inpatient rehabilitation and transferred  Consults: neurology  Significant Diagnostic Studies: labs:  and radiology: MRI: as above  Treatments: IV hydration and anticoagulation: ASA  Discharge Exam: Blood pressure 126/66, pulse 58, temperature 98 F (36.7 C), temperature source Oral, resp. rate 16, height 5\' 2"  (1.575 m), weight 74.617 kg (164 lb 8 oz), SpO2 93.00%. Resp: clear to auscultation bilaterally Cardio: regular rate and rhythm, S1, S2 normal, no murmur, click, rub or gallop  Disposition: 62-Rehab Facility   Medication List  As of 11/23/2011  6:12 AM   ASK your doctor about these medications         aspirin EC 81 MG tablet   Take 81 mg by mouth daily with breakfast.      clopidogrel 75 MG tablet   Commonly known as: PLAVIX   Take 75 mg by mouth daily with breakfast.      fish oil-omega-3 fatty acids 1000 MG capsule   Take 1 g by mouth daily with breakfast.      levalbuterol 45 MCG/ACT inhaler   Commonly known as: XOPENEX HFA   Inhale 1-2 puffs into the lungs every 4 (four) hours as needed for wheezing.      levothyroxine 50 MCG tablet   Commonly known as: SYNTHROID, LEVOTHROID   Take 50 mcg by mouth daily with breakfast.      multivitamin with minerals Tabs   Take 1 tablet by mouth daily with breakfast.      nitrofurantoin 100 MG capsule    Commonly known as: MACRODANTIN   Take 100 mg by mouth daily with breakfast.      pantoprazole 40 MG tablet   Commonly known as: PROTONIX   Take 40 mg by mouth daily with breakfast.      rosuvastatin 20 MG tablet   Commonly known as: CRESTOR   Take 20 mg by mouth every evening.      sertraline 50 MG tablet   Commonly known as: ZOLOFT   Take 75 mg by mouth every evening. Patient takes 1 and 1/2 tablets by mouth every day             Signed: Lillia Mountain 11/23/2011, 6:12 AM

## 2011-11-23 NOTE — Discharge Summary (Signed)
NAMENAKEITHA, Gina Bowers               ACCOUNT NO.:  [id number]  MEDICAL RECORD NO.:  [id number]  LOCATION:  4151                         FACILITY:  [facility]  PHYSICIAN:  Erick Colace, M.D.DATE OF BIRTH:  [birth year]  DATE OF ADMISSION:  11/17/2011 DATE OF DISCHARGE:  11/24/2011                              DISCHARGE SUMMARY   DISCHARGE DIAGNOSES: 1. Right subcortical thrombotic infarction. 2. Sequential compression devices for deep vein thrombosis     prophylaxis. 3. Hypothyroidism. 4. Hyperlipidemia. 5. Urinary incontinence. 6. Depression. 7. Coronary artery disease with coronary artery bypass grafting.  This is an 78 year old right-handed female with history of coronary artery disease as well as multiple strokes.  The patient is a resident of [place].  Admitted November 13, 2011, with acute onset of left-sided weakness.  Per family, she stood up and used her walker to get back to bed when caregiver noted left facial droop with left-sided weakness.  She recovered nicely from history of previous strokes.  She had been on Plavix and aspirin prior to admission.  MRI of the brain showed a 10 mm acute infarct in the deep cerebral white matter on the right.  MRA of the head with diffuse intracranial atherosclerotic disease as well as occluded left vertebral artery.  Echocardiogram with ejection fraction of 60% grade 1 diastolic dysfunction.  Carotid Dopplers with no ICA stenosis.  Neurology Services consulted, remained on aspirin and Plavix therapy.  The patient was admitted for comprehensive rehab program.  PAST MEDICAL HISTORY:  See discharge diagnoses.  ALLERGIES:  SULFA.  SOCIAL SITUATION:  The patient lives alone at [facility]  She has a personal care attendant.  Functional history, prior to admission she does not drive.  She is retired.  He used a walker.  Functional status upon admission to rehab services  minimal assist to ambulate 160 feet with a rolling walker, minimal assist to scoot to the edge of the bed.  PHYSICAL EXAMINATION:  VITAL SIGNS:  Blood pressure 126/66, pulse 58, respirations 16, temperature 98. GENERAL:  This is an alert female, well developed, no acute distress. Normocephalic.  Speech was mildly dysarthric, but fully intelligible. She follows simple commands.  She is oriented x3. LUNGS:  Clear to auscultation. CARDIAC:  Regular rate and rhythm. ABDOMEN:  Soft, nontender.  Good bowel sounds.  REHABILITATION HOSPITAL COURSE:  The patient was admitted to inpatient rehab services with therapies initiated on a 3-hour daily basis consisting of physical therapy, occupational therapy, speech therapy, and rehabilitation nursing.  The following issues were addressed during the patient's rehabilitation stay.  Pertaining to Ms. Hinderliter's right subcortical thrombotic infarction, remained stable, maintained on aspirin and Plavix therapy.  Sequential compression devices were in place for deep vein thrombosis prophylaxis.  Her blood pressures were well controlled.  She did have a history of urinary incontinence. Maintained on prophylactic, Macrodantin as well as Enablex which had been added during her hospital course.  She continued on hormone supplement for hypothyroidism.  Blood pressures are well controlled on no present antihypertensive medications.  Lipitor ongoing for hyperlipidemia.  She did have a history of depression, maintained on Zoloft with emotional support and  positive reinforcement provided.  The patient received weekly collaborative interdisciplinary team conferences to discuss estimated length of stay, family teaching, and any barriers to discharge.  She was ambulating extended distances with a rolling walker, minimal assist for lower body activities of daily living.  Her strength and endurance have greatly improved throughout her rehab course and plan was to return  back to independent living facility with a personal care attendant.  Latest labs showed a hemoglobin 12.3, hematocrit 35.2, WBC of 7.  Sodium 137, potassium 4.2, BUN 16, creatinine 0.49.  DISCHARGE MEDICATIONS: 1. Aspirin 81 mg daily. 2. Lipitor 40 mg daily. 3. Plavix 75 mg daily. 4. Enablex 7.5 mg daily. 5. Synthroid 50 mcg daily. 6. Multivitamin daily. 7. Macrodantin 100 mg at breakfast. 8. Protonix 40 mg daily. 9. Zoloft 75 mg daily.  DIET:  Regular.  SPECIAL INSTRUCTIONS:  Continue therapies as advised to rehab services. The patient should follow up Dr. Kirby Funk medical management in 2 weeks, call for appointment.  Dr. Claudette Laws on December 18, 2011, at 10:00 a.m.     Mariam Dollar, P.A.   ______________________________ Erick Colace, M.D.    DA/MEDQ  D:  11/23/2011  T:  11/23/2011  Job:  [id number]  cc:   Thora Lance, M.D. Erick Colace, M.D.

## 2011-11-23 NOTE — Progress Notes (Signed)
Patient ID: Gina Bowers, female   DOB: 1925-07-07, 78 y.o.   MRN: [id number]  Subjective/Complaints:  Gina Bowers is a 78 y.o. right-handed female with history of coronary artery disease as well as multiple strokes. Patient is a resident of [facility]. Admitted 11/13/2011 with acute onset left-sided weakness. Per family member she stood up and used her walker to get back to bed when caregiver noted left facial droop and left-sided weakness. She has recovered nicely from history of previous strokes. She had been placed on aspirin/ Plavix. MRI of the brain showed 10 mm acute infarct in the deep cerebral white matter on the right. MRA of the head with diffuse intracranial atherosclerotic disease as well as occluded left vertebral artery. Echocardiogram with ejection fraction of 60% grade 1 diastolic dysfunction. Carotid Doppler with no ICA stenosis. Neurology services consulted presently patient remains on aspirin and Plavix therapy.  Slept well no issues this am  Review of Systems  Gastrointestinal: Positive for constipation.  Neurological: Positive for focal weakness.  All other systems reviewed and are negative.   Objective: Vital Signs: Blood pressure 126/66, pulse 58, temperature 98 F (36.7 C), temperature source Oral, resp. rate 16, height 5\' 2"  (1.575 m), weight 74.617 kg (164 lb 8 oz), SpO2 93.00%. No results found. No results found for this or any previous visit (from the past 72 hour(s)).   HEENT: normal Cardio: RRR Resp: CTA B/L GI: BS positive Extremity:  No Edema Skin:   Intact Neuro: Alert/Oriented, Abnormal Motor 3-/5 in L delt bi tri grip, 4/5 in L HF, KE, Ankle DF and Abnormal FMC Ataxic/ dec FMC Musc/Skel:  Normal   Assessment/Plan: 1. Functional deficits secondary to R frontal subcortical infarct with L HP which require 3+ hours per day of interdisciplinary therapy in a comprehensive inpatient rehab setting. Physiatrist is providing  close team supervision and 24 hour management of active medical problems listed below. Physiatrist and rehab team continue to assess barriers to discharge/monitor patient progress toward functional and medical goals. FIM: FIM - Bathing Bathing Steps Patient Completed: Chest;Left upper leg;Right Arm;Left Arm;Right lower leg (including foot);Abdomen;Front perineal area;Buttocks;Right upper leg;Left lower leg (including foot) (in room shower) Bathing: 5: Supervision: Safety issues/verbal cues  FIM - Upper Body Dressing/Undressing Upper body dressing/undressing steps patient completed: Thread/unthread right bra strap;Thread/unthread left bra strap;Hook/unhook bra;Thread/unthread right sleeve of pullover shirt/dresss;Thread/unthread left sleeve of pullover shirt/dress;Put head through opening of pull over shirt/dress;Pull shirt over trunk Upper body dressing/undressing: 5: Supervision: Safety issues/verbal cues FIM - Lower Body Dressing/Undressing Lower body dressing/undressing steps patient completed: Thread/unthread right underwear leg;Thread/unthread left underwear leg;Pull underwear up/down;Thread/unthread right pants leg;Thread/unthread left pants leg;Pull pants up/down;Fasten/unfasten pants;Don/Doff right sock;Don/Doff left sock;Fasten/unfasten right shoe;Don/Doff left shoe;Don/Doff right shoe;Fasten/unfasten left shoe Lower body dressing/undressing: 5: Supervision: Safety issues/verbal cues  FIM - Toileting Toileting steps completed by patient: Adjust clothing prior to toileting;Performs perineal hygiene;Adjust clothing after toileting Toileting Assistive Devices: Grab bar or rail for support Toileting: 7: Independent: No helper, no device  FIM - Diplomatic Services operational officer Devices: Art gallery manager Transfers: 7-Independent: No helper  FIM - Banker Devices: Therapist, occupational: 7: Supine > Sit: No assist;5: Bed > Chair or W/C:  Supervision (verbal cues/safety issues)  FIM - Locomotion: Wheelchair Locomotion: Wheelchair: 0: Activity did not occur FIM - Locomotion: Ambulation Locomotion: Ambulation Assistive Devices: Designer, industrial/product (rollator) Ambulation/Gait Assistance: 6: Modified independent (Device/Increase time) Locomotion: Ambulation: 5: Travels 150 ft or more with supervision/safety issues  Comprehension Comprehension Mode: Auditory Comprehension: 6-Follows complex conversation/direction: With extra time/assistive device  Expression Expression Mode: Verbal Expression: 7-Expresses complex ideas: With no assist  Social Interaction Social Interaction: 7-Interacts appropriately with others - No medications needed.  Problem Solving Problem Solving: 6-Solves complex problems: With extra time  Memory Memory: 6-Assistive device: No helper  Medical Problem List and Plan:  1. Right subcortical thrombotic infarct  2. DVT Prophylaxis/Anticoagulation: SCDs. Monitor for any signs of DVT  3. Hypothyroidism. Synthroid  4. Hyperlipidemia. Lipitor  5. Neuropsych: This patient is capable of making decisions on his/her own behalf.  6. Urinary incontinence with recurrent urinary tract infection. Continue Enablex and prophylactic Macrodantin.Pt is followed by urology as an outpt.  7. Depression. Zoloft. Provide emotional support and positive reinforcement  8. CAD/CABG. No chest pain shortness of breath. Continue aspirin. Followup cardiology services as needed       LOS (Days) 6 A FACE TO FACE EVALUATION WAS PERFORMED  Jemario Poitras E 11/23/2011, 9:08 AM

## 2011-11-23 NOTE — Progress Notes (Signed)
Per previous report, Miralax given in AM on first shift and PRN sorbitol given at 1800. Incontinent stool on peri pad and small amout on shorts. Pad and shorts managed by patient. Large continent stool in bathroom. Tawanna Solo

## 2011-11-23 NOTE — Progress Notes (Signed)
Occupational Therapy Session Note  Patient Details  Name: Gina Bowers MRN: [id number] Date of Birth: 1925-07-15  Today's Date: 11/23/2011 Time: 905- 1000 (55 minutes)    Short Term Goals: No short term goals set  Skilled Therapeutic Interventions/Progress Updates:    Pt seen for BADL retraining of toileting, bathing, and dressing with a focus on pt competing tasks at a mod I to I level demonstrating good dynamic standing balance and functional use of LUE at a non dominant level.  Pt was able to complete all BADL tasks safely.      Therapy Documentation Precautions:  Precautions Precautions: Fall Precaution Comments: LOB with turns  Restrictions Weight Bearing Restrictions: No  Pain: Pain Assessment Pain Assessment: No/denies pain Pain Score: 0-No pain   See FIM for current functional status  Therapy/Group: Individual Therapy  Morgane Joerger 11/23/2011, 9:31 AM

## 2011-11-23 NOTE — Discharge Summary (Signed)
  Discharge summary job # [id number]

## 2011-11-23 NOTE — Plan of Care (Signed)
Problem: RH BOWEL ELIMINATION Goal: RH STG MANAGE BOWEL W/MEDICATION W/ASSISTANCE STG Manage Bowel with Medication with Assistance. Min assist  Outcome: Completed/Met Date Met:  11/23/11 Patient has PRN medications only

## 2011-11-24 MED ORDER — DARIFENACIN HYDROBROMIDE ER 7.5 MG PO TB24: 7.5000 mg | ORAL_TABLET | Freq: Every day | ORAL | Status: DC

## 2011-11-24 NOTE — Progress Notes (Signed)
Pt discharged at 1130. Education given to pt and daughter. Belongings with family. No further questions at this time.

## 2011-11-24 NOTE — Progress Notes (Signed)
Patient ID: Gina Bowers, female   DOB: Aug 15, 1925, 78 y.o.   MRN: [id number]  Subjective/Complaints:  Gina Bowers is a 78 y.o. right-handed female with history of coronary artery disease as well as multiple strokes. Patient is a resident of [facility]. Admitted 11/13/2011 with acute onset left-sided weakness. Per family member she stood up and used her walker to get back to bed when caregiver noted left facial droop and left-sided weakness. She has recovered nicely from history of previous strokes. She had been placed on aspirin/ Plavix. MRI of the brain showed 10 mm acute infarct in the deep cerebral white matter on the right. MRA of the head with diffuse intracranial atherosclerotic disease as well as occluded left vertebral artery. Echocardiogram with ejection fraction of 60% grade 1 diastolic dysfunction. Carotid Doppler with no ICA stenosis. Neurology services consulted presently patient remains on aspirin and Plavix therapy.  Slept well no issues this am,chronic bladder issues  Mod I in room without problems  Review of Systems  Gastrointestinal: Positive for constipation.  Neurological: Positive for focal weakness.  All other systems reviewed and are negative.   Objective: Vital Signs: Blood pressure 135/72, pulse 62, temperature 98 F (36.7 C), temperature source Oral, resp. rate 19, height 5\' 2"  (1.575 m), weight 74.617 kg (164 lb 8 oz), SpO2 99.00%. No results found. No results found for this or any previous visit (from the past 72 hour(s)).   HEENT: normal Cardio: RRR Resp: CTA B/L GI: BS positive Extremity:  No Edema Skin:   Intact Neuro: Alert/Oriented, Abnormal Motor 3-/5 in L delt bi tri grip, 4/5 in L HF, KE, Ankle DF and Abnormal FMC Ataxic/ dec FMC Musc/Skel:  Normal   Assessment/Plan: 1. Functional deficits secondary to R frontal subcortical infarct with L HP stable for D/C PMR f/u PCP f/u Neuro f/u Uro f/u            FIM: FIM -  Bathing Bathing Steps Patient Completed: Chest;Left upper leg;Right Arm;Left Arm;Right lower leg (including foot);Abdomen;Front perineal area;Buttocks;Right upper leg;Left lower leg (including foot) (in room shower) Bathing: 6: More than reasonable amount of time  FIM - Upper Body Dressing/Undressing Upper body dressing/undressing steps patient completed: Thread/unthread right bra strap;Thread/unthread left bra strap;Hook/unhook bra;Thread/unthread right sleeve of pullover shirt/dresss;Thread/unthread left sleeve of pullover shirt/dress;Put head through opening of pull over shirt/dress;Pull shirt over trunk Upper body dressing/undressing: 7: Complete Independence: No helper FIM - Lower Body Dressing/Undressing Lower body dressing/undressing steps patient completed: Thread/unthread right underwear leg;Thread/unthread left underwear leg;Pull underwear up/down;Thread/unthread right pants leg;Thread/unthread left pants leg;Pull pants up/down;Fasten/unfasten pants;Don/Doff right sock;Don/Doff left sock;Fasten/unfasten right shoe;Don/Doff left shoe;Don/Doff right shoe;Fasten/unfasten left shoe Lower body dressing/undressing: 6: More than reasonable amount of time  FIM - Toileting Toileting steps completed by patient: Adjust clothing prior to toileting Toileting Assistive Devices: Grab bar or rail for support Toileting: 6: Assistive device: No helper  FIM - Diplomatic Services operational officer Devices: Best boy Transfers: 6-Assistive device: No helper  FIM - Banker Devices: Therapist, occupational: 6: Assistive device: no helper  FIM - Locomotion: Wheelchair Locomotion: Wheelchair: 0: Activity did not occur FIM - Locomotion: Ambulation Locomotion: Ambulation Assistive Devices: Designer, industrial/product (rollator) Ambulation/Gait Assistance: 6: Modified independent (Device/Increase time) Locomotion: Ambulation: 6: Travels 150 ft or more  with assistive device/no helper  Comprehension Comprehension Mode: Auditory Comprehension: 6-Follows complex conversation/direction: With extra time/assistive device  Expression Expression Mode: Verbal Expression: 7-Expresses complex ideas: With no assist  Social Interaction Social Interaction: 7-Interacts appropriately with others - No medications needed.  Problem Solving Problem Solving: 6-Solves complex problems: With extra time  Memory Memory: 7-Complete Independence: No helper  Medical Problem List and Plan:  1. Right subcortical thrombotic infarct  2. DVT Prophylaxis/Anticoagulation: SCDs. Monitor for any signs of DVT  3. Hypothyroidism. Synthroid  4. Hyperlipidemia. Lipitor  5. Neuropsych: This patient is capable of making decisions on his/her own behalf.  6. Urinary incontinence with recurrent urinary tract infection. Continue Enablex and prophylactic Macrodantin.Pt is followed by urology as an outpt.  7. Depression. Zoloft. Provide emotional support and positive reinforcement  8. CAD/CABG. No chest pain shortness of breath. Continue aspirin. Followup cardiology services as needed       LOS (Days) 7 A FACE TO FACE EVALUATION WAS PERFORMED  Ahlana Slaydon E 11/24/2011, 7:43 AM

## 2011-11-24 NOTE — Progress Notes (Signed)
Social Work  Discharge Note  The overall goal for the admission was met for:   Discharge location: Yes - return to [place] apartment at [employer] of Stay: Yes - 7 days  Discharge activity level: Yes - modified independent  Home/community participation: Yes  Services provided included: MD, RD, PT, OT, RN, CM, TR, Pharmacy and SW  Financial Services: Medicare  Follow-up services arranged: Home Health: PT via [facility] and Patient/Family has no preference for HH/DME agencies  Comments (or additional information): Provided info on two local Stroke Support Groups  Patient/Family verbalized understanding of follow-up arrangements: Yes  Individual responsible for coordination of the follow-up plan: patient  Confirmed correct DME delivered: NA  Gina Bowers

## 2011-11-24 NOTE — Discharge Instructions (Signed)
Inpatient Rehab Discharge Instructions  [facility] Discharge date and time: No discharge date for patient encounter.   Activities/Precautions/ Functional Status: Activity: activity as tolerated Diet: cardiac diet Wound Care: none needed Functional status:  ___ No restrictions     ___ Walk up steps independently ___ 24/7 supervision/assistance   _x STROKE/TIA DISCHARGE INSTRUCTIONS SMOKING Cigarette smoking nearly doubles your risk of having a stroke & is the single most alterable risk factor  If you smoke or have smoked in the last 12 months, you are advised to quit smoking for your health.  Most of the excess cardiovascular risk related to smoking disappears within a year of stopping.  Ask you doctor about anti-smoking medications  [place] Quit Line: 1-800-QUIT NOW  Free Smoking Cessation Classes [phone]  CHOLESTEROL Know your levels; limit fat & cholesterol in your diet  Lab Results  Component Value Date   CHOL 157 11/13/2011   HDL 59 11/13/2011   LDLCALC 75 11/13/2011   TRIG 117 11/13/2011   CHOLHDL 2.7 11/13/2011      Many patients benefit from treatment even if their cholesterol is at goal.  Goal: Total Cholesterol less than 160  Goal:  LDL less than 100  Goal:  HDL greater than 40  Goal:  Triglycerides less than 150  BLOOD PRESSURE American Stroke Association blood pressure target is less that 120/80 mm/Hg  Your discharge blood pressure is:  BP: 126/66 mmHg  Monitor your blood pressure  Limit your salt and alcohol intake  Many individuals will require more than one medication for high blood pressure  DIABETES (A1c is a blood sugar average for last 3 months) Goal A1c is under 7% (A1c is blood sugar average for last 3 months)  Diabetes: No known diagnosis of diabetes    Lab Results  Component Value Date   HGBA1C 5.7* 11/13/2011    Your A1c can be lowered with medications, healthy diet, and exercise.  Check your blood sugar as directed by your  physician  Call your physician if you experience unexplained or low blood sugars.  PHYSICAL ACTIVITY/REHABILITATION Goal is 30 minutes at least 4 days per week    Activity decreases your risk of heart attack and stroke and makes your heart stronger.  It helps control your weight and blood pressure; helps you relax and can improve your mood.  Participate in a regular exercise program.  Talk with your doctor about the best form of exercise for you (dancing, walking, swimming, cycling).  DIET/WEIGHT Goal is to maintain a healthy weight  Your height is:  Height: 5\' 2"  (157.5 cm) Your current weight is: Weight: 74.617 kg (164 lb 8 oz) Your body Mass Index (BMI) is:  BMI (Calculated): 30.7   Following the type of diet specifically designed for you will help prevent another stroke.  Your goal Body Mass Index (BMI) is 19-24.  Healthy food habits can help reduce 3 risk factors for stroke:  High cholesterol, hypertension, and excess weight.     __ Walk up steps with assistance ___ Intermittent supervision/assistance  ___ Bathe/dress independently ___ Walk with walker     ___ Bathe/dress with assistance ___ Walk Independently    ___ Shower independently __x_ Walk with assistance    ___ Shower with assistance ___ No alcohol     ___ Return to work/school ________   COMMUNITY REFERRALS UPON DISCHARGE:    Home Health:   PT  Agency:  [facility] Phone: [phone]      GENERAL COMMUNITY RESOURCES FOR PATIENT/FAMILY:  Support Groups:  Stroke Support Groups (info provided)        Special Instructions:    My questions have been answered and I understand these instructions. I will adhere to these goals and the provided educational materials after my discharge from the hospital.  Patient/Caregiver Signature _______________________________ Date __________  Clinician Signature _______________________________________ Date __________  Please bring this form and  your medication list with you to all your follow-up doctor's appointments.

## 2011-11-27 DIAGNOSIS — J8410 Pulmonary fibrosis, unspecified: Secondary | ICD-10-CM | POA: Diagnosis not present

## 2011-11-27 DIAGNOSIS — R269 Unspecified abnormalities of gait and mobility: Secondary | ICD-10-CM | POA: Diagnosis not present

## 2011-11-27 DIAGNOSIS — I69998 Other sequelae following unspecified cerebrovascular disease: Secondary | ICD-10-CM | POA: Diagnosis not present

## 2011-11-27 DIAGNOSIS — F329 Major depressive disorder, single episode, unspecified: Secondary | ICD-10-CM | POA: Diagnosis not present

## 2011-11-27 DIAGNOSIS — M216X9 Other acquired deformities of unspecified foot: Secondary | ICD-10-CM | POA: Diagnosis not present

## 2011-11-27 DIAGNOSIS — IMO0001 Reserved for inherently not codable concepts without codable children: Secondary | ICD-10-CM | POA: Diagnosis not present

## 2011-12-01 DIAGNOSIS — J8410 Pulmonary fibrosis, unspecified: Secondary | ICD-10-CM | POA: Diagnosis not present

## 2011-12-01 DIAGNOSIS — R269 Unspecified abnormalities of gait and mobility: Secondary | ICD-10-CM | POA: Diagnosis not present

## 2011-12-01 DIAGNOSIS — I69998 Other sequelae following unspecified cerebrovascular disease: Secondary | ICD-10-CM | POA: Diagnosis not present

## 2011-12-01 DIAGNOSIS — IMO0001 Reserved for inherently not codable concepts without codable children: Secondary | ICD-10-CM | POA: Diagnosis not present

## 2011-12-01 DIAGNOSIS — M216X9 Other acquired deformities of unspecified foot: Secondary | ICD-10-CM | POA: Diagnosis not present

## 2011-12-01 DIAGNOSIS — F329 Major depressive disorder, single episode, unspecified: Secondary | ICD-10-CM | POA: Diagnosis not present

## 2011-12-03 DIAGNOSIS — F329 Major depressive disorder, single episode, unspecified: Secondary | ICD-10-CM | POA: Diagnosis not present

## 2011-12-03 DIAGNOSIS — M216X9 Other acquired deformities of unspecified foot: Secondary | ICD-10-CM | POA: Diagnosis not present

## 2011-12-03 DIAGNOSIS — J8410 Pulmonary fibrosis, unspecified: Secondary | ICD-10-CM | POA: Diagnosis not present

## 2011-12-03 DIAGNOSIS — I69998 Other sequelae following unspecified cerebrovascular disease: Secondary | ICD-10-CM | POA: Diagnosis not present

## 2011-12-03 DIAGNOSIS — IMO0001 Reserved for inherently not codable concepts without codable children: Secondary | ICD-10-CM | POA: Diagnosis not present

## 2011-12-03 DIAGNOSIS — R269 Unspecified abnormalities of gait and mobility: Secondary | ICD-10-CM | POA: Diagnosis not present

## 2011-12-07 DIAGNOSIS — J8410 Pulmonary fibrosis, unspecified: Secondary | ICD-10-CM | POA: Diagnosis not present

## 2011-12-07 DIAGNOSIS — R269 Unspecified abnormalities of gait and mobility: Secondary | ICD-10-CM | POA: Diagnosis not present

## 2011-12-07 DIAGNOSIS — I69998 Other sequelae following unspecified cerebrovascular disease: Secondary | ICD-10-CM | POA: Diagnosis not present

## 2011-12-07 DIAGNOSIS — IMO0001 Reserved for inherently not codable concepts without codable children: Secondary | ICD-10-CM | POA: Diagnosis not present

## 2011-12-07 DIAGNOSIS — M216X9 Other acquired deformities of unspecified foot: Secondary | ICD-10-CM | POA: Diagnosis not present

## 2011-12-07 DIAGNOSIS — F329 Major depressive disorder, single episode, unspecified: Secondary | ICD-10-CM | POA: Diagnosis not present

## 2011-12-08 ENCOUNTER — Telehealth: Payer: Self-pay | Admitting: Physical Medicine & Rehabilitation

## 2011-12-08 NOTE — Telephone Encounter (Signed)
Would like approval for vestibular evaluation

## 2011-12-08 NOTE — Telephone Encounter (Signed)
Verbal approval was given to [place] per Clydie Braun.

## 2011-12-08 NOTE — Telephone Encounter (Signed)
Patient was never seen in the office, if she does not have C-spine surgery she can be referred

## 2011-12-08 NOTE — Telephone Encounter (Signed)
Please advise 

## 2011-12-09 DIAGNOSIS — R0989 Other specified symptoms and signs involving the circulatory and respiratory systems: Secondary | ICD-10-CM | POA: Diagnosis not present

## 2011-12-09 DIAGNOSIS — I6990 Unspecified sequelae of unspecified cerebrovascular disease: Secondary | ICD-10-CM | POA: Diagnosis not present

## 2011-12-09 DIAGNOSIS — R0609 Other forms of dyspnea: Secondary | ICD-10-CM | POA: Diagnosis not present

## 2011-12-10 DIAGNOSIS — M216X9 Other acquired deformities of unspecified foot: Secondary | ICD-10-CM | POA: Diagnosis not present

## 2011-12-10 DIAGNOSIS — I69998 Other sequelae following unspecified cerebrovascular disease: Secondary | ICD-10-CM | POA: Diagnosis not present

## 2011-12-10 DIAGNOSIS — F329 Major depressive disorder, single episode, unspecified: Secondary | ICD-10-CM | POA: Diagnosis not present

## 2011-12-10 DIAGNOSIS — R269 Unspecified abnormalities of gait and mobility: Secondary | ICD-10-CM | POA: Diagnosis not present

## 2011-12-10 DIAGNOSIS — IMO0001 Reserved for inherently not codable concepts without codable children: Secondary | ICD-10-CM | POA: Diagnosis not present

## 2011-12-10 DIAGNOSIS — J8410 Pulmonary fibrosis, unspecified: Secondary | ICD-10-CM | POA: Diagnosis not present

## 2011-12-15 DIAGNOSIS — M216X9 Other acquired deformities of unspecified foot: Secondary | ICD-10-CM | POA: Diagnosis not present

## 2011-12-15 DIAGNOSIS — F329 Major depressive disorder, single episode, unspecified: Secondary | ICD-10-CM | POA: Diagnosis not present

## 2011-12-15 DIAGNOSIS — IMO0001 Reserved for inherently not codable concepts without codable children: Secondary | ICD-10-CM | POA: Diagnosis not present

## 2011-12-15 DIAGNOSIS — I69998 Other sequelae following unspecified cerebrovascular disease: Secondary | ICD-10-CM | POA: Diagnosis not present

## 2011-12-15 DIAGNOSIS — R269 Unspecified abnormalities of gait and mobility: Secondary | ICD-10-CM | POA: Diagnosis not present

## 2011-12-15 DIAGNOSIS — J8410 Pulmonary fibrosis, unspecified: Secondary | ICD-10-CM | POA: Diagnosis not present

## 2011-12-17 DIAGNOSIS — IMO0001 Reserved for inherently not codable concepts without codable children: Secondary | ICD-10-CM | POA: Diagnosis not present

## 2011-12-17 DIAGNOSIS — F329 Major depressive disorder, single episode, unspecified: Secondary | ICD-10-CM | POA: Diagnosis not present

## 2011-12-17 DIAGNOSIS — R269 Unspecified abnormalities of gait and mobility: Secondary | ICD-10-CM | POA: Diagnosis not present

## 2011-12-17 DIAGNOSIS — J8410 Pulmonary fibrosis, unspecified: Secondary | ICD-10-CM | POA: Diagnosis not present

## 2011-12-17 DIAGNOSIS — I69998 Other sequelae following unspecified cerebrovascular disease: Secondary | ICD-10-CM | POA: Diagnosis not present

## 2011-12-17 DIAGNOSIS — M216X9 Other acquired deformities of unspecified foot: Secondary | ICD-10-CM | POA: Diagnosis not present

## 2011-12-18 ENCOUNTER — Ambulatory Visit (HOSPITAL_BASED_OUTPATIENT_CLINIC_OR_DEPARTMENT_OTHER): Payer: Medicare Other | Admitting: Physical Medicine & Rehabilitation

## 2011-12-18 ENCOUNTER — Encounter: Payer: Self-pay | Admitting: Physical Medicine & Rehabilitation

## 2011-12-18 ENCOUNTER — Encounter: Payer: Medicare Other | Attending: Physical Medicine & Rehabilitation

## 2011-12-18 VITALS — BP 143/75 | HR 67 | Resp 16 | Ht 62.0 in | Wt 169.4 lb

## 2011-12-18 DIAGNOSIS — R29898 Other symptoms and signs involving the musculoskeletal system: Secondary | ICD-10-CM | POA: Diagnosis not present

## 2011-12-18 DIAGNOSIS — Z0271 Encounter for disability determination: Secondary | ICD-10-CM | POA: Diagnosis not present

## 2011-12-18 DIAGNOSIS — N952 Postmenopausal atrophic vaginitis: Secondary | ICD-10-CM | POA: Diagnosis not present

## 2011-12-18 DIAGNOSIS — I639 Cerebral infarction, unspecified: Secondary | ICD-10-CM

## 2011-12-18 DIAGNOSIS — I69998 Other sequelae following unspecified cerebrovascular disease: Secondary | ICD-10-CM

## 2011-12-18 DIAGNOSIS — M216X9 Other acquired deformities of unspecified foot: Secondary | ICD-10-CM | POA: Diagnosis not present

## 2011-12-18 DIAGNOSIS — R269 Unspecified abnormalities of gait and mobility: Secondary | ICD-10-CM

## 2011-12-18 DIAGNOSIS — I6350 Cerebral infarction due to unspecified occlusion or stenosis of unspecified cerebral artery: Secondary | ICD-10-CM

## 2011-12-18 DIAGNOSIS — I6990 Unspecified sequelae of unspecified cerebrovascular disease: Secondary | ICD-10-CM | POA: Insufficient documentation

## 2011-12-18 DIAGNOSIS — R279 Unspecified lack of coordination: Secondary | ICD-10-CM | POA: Insufficient documentation

## 2011-12-18 DIAGNOSIS — G8320 Monoplegia of upper limb affecting unspecified side: Secondary | ICD-10-CM | POA: Diagnosis not present

## 2011-12-18 DIAGNOSIS — N3020 Other chronic cystitis without hematuria: Secondary | ICD-10-CM | POA: Diagnosis not present

## 2011-12-18 NOTE — Progress Notes (Signed)
  Subjective:    Patient ID: Gina Bowers, female    DOB: Dec 05, 1925, 78 y.o.   MRN: [id number] DATE OF ADMISSION: 11/17/2011 CIR DATE OF DISCHARGE: 11/24/2011 This is an 78 year old right-handed female with history of coronary  artery disease as well as multiple strokes. The patient is a resident  of [place]. Admitted November 13, 2011,  with acute onset of left-sided weakness. Per family, she stood up and  used her walker to get back to bed when caregiver noted left facial  droop with left-sided weakness. She recovered nicely from history of  previous strokes. She had been on Plavix and aspirin prior to  admission. MRI of the brain showed a 10 mm acute infarct in the deep  cerebral white matter on the right. MRA of the head with diffuse  intracranial atherosclerotic disease as well as occluded left vertebral  artery. Echocardiogram with ejection fraction of 60% grade 1 diastolic  dysfunction. Carotid Dopplers with no ICA stenosis. Neurology Services  consulted, remained on aspirin and Plavix therapy  HPI Living at [facility] hired caregiver comes in to help with dressing and bathing As well as medications. Home health physical therapy now doing vestibular rehabilitation  Review of Systems     Objective:   Physical Exam  Constitutional: She is oriented to person, place, and time. She appears well-developed and well-nourished.  HENT:  Head: Normocephalic and atraumatic.  Eyes: Conjunctivae and EOM are normal. Pupils are equal, round, and reactive to light.  Neurological: She is alert and oriented to person, place, and time. She has normal reflexes. She exhibits normal muscle tone. Gait abnormal.       Uses walker for ambulation 4/5 in LUE with decreased [facility]  Psychiatric: She has a normal mood and affect.          Assessment & Plan:  1. Right subcortical infarct with residual left upper extremity weakness and poor coordination. She had  a pre-existing balance disorder do to a prior cerebellar CVA. She will complete vestibular rehabilitation. I will see her back on an as-needed basis She will followup with Dr. Kirby Funk

## 2011-12-18 NOTE — Patient Instructions (Signed)
see. Dr. Valentina Lucks as scheduled

## 2011-12-18 NOTE — Progress Notes (Signed)
Subjective:    Patient ID: Gina Bowers, female    DOB: May 27, 1925, 78 y.o.   MRN: [id number]  HPI  Pain Inventory Average Pain 0 Pain Right Now 0 My pain is no pain  In the last 24 hours, has pain interfered with the following? General activity 0 Relation with others 0 Enjoyment of life 0 What TIME of day is your pain at its worst? no pain Sleep (in general) Fair  Pain is worse with: no pain Pain improves with: no pain Relief from Meds: no pain  Mobility walk with assistance use a walker ability to climb steps?  no do you drive?  no  Function retired I need assistance with the following:  dressing, bathing, meal prep, household duties and shopping  Neuro/Psych bladder control problems weakness trouble walking dizziness loss of taste or smell  Prior Studies Any changes since last visit?  no  Physicians involved in your care Any changes since last visit?  no   Family History  Problem Relation Age of Onset  . Heart disease Mother    History   Social History  . Marital Status: Widowed    Spouse Name: N/A    Number of Children: N/A  . Years of Education: N/A   Social History Main Topics  . Smoking status: Never Smoker   . Smokeless tobacco: Never Used  . Alcohol Use: No  . Drug Use: No  . Sexually Active:    Other Topics Concern  . None   Social History Narrative  . None   Past Surgical History  Procedure Date  . Coronary angioplasty 10/2010    WITH STENT PLACEMENT  . Coronary artery bypass graft 2009  . Joint replacement 2007    HIP REPLACEMENT -LEFT  . Bladder tack     1969 AND 1990  . Cystoscopy with injection 09/21/2011    Procedure: CYSTOSCOPY WITH INJECTION;  Surgeon: Kathi Ludwig, MD;  Location: [facility];  Service: Urology;  Laterality: N/A;  Peri Urethral Macroplastique Injection and Estring Placement   Past Medical History  Diagnosis Date  . Coronary artery disease     DR. Alanda Amass IS PT'S CARDIOLOGIST - HX CABG AND HAS  STENT AND TOLD SHE HAS ANOTHER BLOCKAGE-BEING TREATED MEDICALLY  . Stroke     SEVERAL STROKES -TOLD SHE HAS CEREBELLUM BLOCKAGE AS RESULT OF STROKE--BUT NEUROLOGIST SAID HE DID NOT WANT TO DO ANY PROCEDURE BECAUSE OF HER AGE .  PT HAS SLIGHT LEFT FOOT DROP-DRAGS FOOT WHEN WALKING - USES WALKER  . High cholesterol   . Lung nodules     TOLD SHE HAS INTERSTITUAL LUNG DISEASE  . Shortness of breath     AND WHEEZING AT TIMES--NO INHALERS  . Vertigo   . Arthritis   . GERD (gastroesophageal reflux disease)   . Urinary incontinence   . UTI (lower urinary tract infection)     HX OF UTI'S-- LAST TIME COUPLE OF MONTHS AGO  . Hypothyroidism   . Blood transfusion 2007    AFTER HIP REPLACEMENT  . Fracture FEB 2013    FRACTURED LEFT ANKLE--NO SURGERY--WAS IN BOOT UNTIL COUPLE WEEKS AGO  . Depression   . Kidney stones     IN THE PAST  . Carotid bruit     PT'S DAUGHTER STATES PT HAD RECENT CAROTID STUDY AND WAS TOLD NO SIGNIFICANT BLOCKAGES  . [place] virus     COUPLE OF WEEKS AGO--ALL SYMTOMS RESOLVED PER PT   BP 143/75  Pulse 67  Resp 16  Ht 5\' 2"  (1.575 m)  Wt 169 lb 6.4 oz (76.839 kg)  BMI 30.98 kg/m2  SpO2 97%    Review of Systems  Constitutional: Positive for unexpected weight change.  Gastrointestinal: Positive for constipation.  Genitourinary: Positive for urgency.  Musculoskeletal: Positive for gait problem.  Neurological: Positive for dizziness and weakness.  All other systems reviewed and are negative.       Objective:   Physical Exam        Assessment & Plan:

## 2011-12-22 DIAGNOSIS — R269 Unspecified abnormalities of gait and mobility: Secondary | ICD-10-CM | POA: Diagnosis not present

## 2011-12-22 DIAGNOSIS — I69998 Other sequelae following unspecified cerebrovascular disease: Secondary | ICD-10-CM | POA: Diagnosis not present

## 2011-12-22 DIAGNOSIS — IMO0001 Reserved for inherently not codable concepts without codable children: Secondary | ICD-10-CM | POA: Diagnosis not present

## 2011-12-22 DIAGNOSIS — F329 Major depressive disorder, single episode, unspecified: Secondary | ICD-10-CM | POA: Diagnosis not present

## 2011-12-22 DIAGNOSIS — J8410 Pulmonary fibrosis, unspecified: Secondary | ICD-10-CM | POA: Diagnosis not present

## 2011-12-22 DIAGNOSIS — M216X9 Other acquired deformities of unspecified foot: Secondary | ICD-10-CM | POA: Diagnosis not present

## 2011-12-24 DIAGNOSIS — IMO0001 Reserved for inherently not codable concepts without codable children: Secondary | ICD-10-CM | POA: Diagnosis not present

## 2011-12-24 DIAGNOSIS — J8410 Pulmonary fibrosis, unspecified: Secondary | ICD-10-CM | POA: Diagnosis not present

## 2011-12-24 DIAGNOSIS — R269 Unspecified abnormalities of gait and mobility: Secondary | ICD-10-CM | POA: Diagnosis not present

## 2011-12-24 DIAGNOSIS — I69998 Other sequelae following unspecified cerebrovascular disease: Secondary | ICD-10-CM | POA: Diagnosis not present

## 2011-12-24 DIAGNOSIS — F329 Major depressive disorder, single episode, unspecified: Secondary | ICD-10-CM | POA: Diagnosis not present

## 2011-12-24 DIAGNOSIS — M216X9 Other acquired deformities of unspecified foot: Secondary | ICD-10-CM | POA: Diagnosis not present

## 2011-12-28 DIAGNOSIS — R269 Unspecified abnormalities of gait and mobility: Secondary | ICD-10-CM | POA: Diagnosis not present

## 2011-12-28 DIAGNOSIS — J8410 Pulmonary fibrosis, unspecified: Secondary | ICD-10-CM | POA: Diagnosis not present

## 2011-12-28 DIAGNOSIS — M216X9 Other acquired deformities of unspecified foot: Secondary | ICD-10-CM | POA: Diagnosis not present

## 2011-12-28 DIAGNOSIS — I69998 Other sequelae following unspecified cerebrovascular disease: Secondary | ICD-10-CM | POA: Diagnosis not present

## 2011-12-28 DIAGNOSIS — F329 Major depressive disorder, single episode, unspecified: Secondary | ICD-10-CM | POA: Diagnosis not present

## 2011-12-28 DIAGNOSIS — IMO0001 Reserved for inherently not codable concepts without codable children: Secondary | ICD-10-CM | POA: Diagnosis not present

## 2011-12-31 DIAGNOSIS — F329 Major depressive disorder, single episode, unspecified: Secondary | ICD-10-CM | POA: Diagnosis not present

## 2011-12-31 DIAGNOSIS — J8410 Pulmonary fibrosis, unspecified: Secondary | ICD-10-CM | POA: Diagnosis not present

## 2011-12-31 DIAGNOSIS — R269 Unspecified abnormalities of gait and mobility: Secondary | ICD-10-CM | POA: Diagnosis not present

## 2011-12-31 DIAGNOSIS — M216X9 Other acquired deformities of unspecified foot: Secondary | ICD-10-CM | POA: Diagnosis not present

## 2011-12-31 DIAGNOSIS — IMO0001 Reserved for inherently not codable concepts without codable children: Secondary | ICD-10-CM | POA: Diagnosis not present

## 2011-12-31 DIAGNOSIS — I69998 Other sequelae following unspecified cerebrovascular disease: Secondary | ICD-10-CM | POA: Diagnosis not present

## 2012-01-05 DIAGNOSIS — R269 Unspecified abnormalities of gait and mobility: Secondary | ICD-10-CM | POA: Diagnosis not present

## 2012-01-05 DIAGNOSIS — M216X9 Other acquired deformities of unspecified foot: Secondary | ICD-10-CM | POA: Diagnosis not present

## 2012-01-05 DIAGNOSIS — I69998 Other sequelae following unspecified cerebrovascular disease: Secondary | ICD-10-CM | POA: Diagnosis not present

## 2012-01-05 DIAGNOSIS — F329 Major depressive disorder, single episode, unspecified: Secondary | ICD-10-CM | POA: Diagnosis not present

## 2012-01-05 DIAGNOSIS — J8410 Pulmonary fibrosis, unspecified: Secondary | ICD-10-CM | POA: Diagnosis not present

## 2012-01-05 DIAGNOSIS — IMO0001 Reserved for inherently not codable concepts without codable children: Secondary | ICD-10-CM | POA: Diagnosis not present

## 2012-01-07 DIAGNOSIS — M216X9 Other acquired deformities of unspecified foot: Secondary | ICD-10-CM | POA: Diagnosis not present

## 2012-01-07 DIAGNOSIS — R269 Unspecified abnormalities of gait and mobility: Secondary | ICD-10-CM | POA: Diagnosis not present

## 2012-01-07 DIAGNOSIS — I69998 Other sequelae following unspecified cerebrovascular disease: Secondary | ICD-10-CM | POA: Diagnosis not present

## 2012-01-07 DIAGNOSIS — J8410 Pulmonary fibrosis, unspecified: Secondary | ICD-10-CM | POA: Diagnosis not present

## 2012-01-07 DIAGNOSIS — F329 Major depressive disorder, single episode, unspecified: Secondary | ICD-10-CM | POA: Diagnosis not present

## 2012-01-07 DIAGNOSIS — IMO0001 Reserved for inherently not codable concepts without codable children: Secondary | ICD-10-CM | POA: Diagnosis not present

## 2012-01-12 DIAGNOSIS — M216X9 Other acquired deformities of unspecified foot: Secondary | ICD-10-CM | POA: Diagnosis not present

## 2012-01-12 DIAGNOSIS — I69998 Other sequelae following unspecified cerebrovascular disease: Secondary | ICD-10-CM | POA: Diagnosis not present

## 2012-01-12 DIAGNOSIS — F329 Major depressive disorder, single episode, unspecified: Secondary | ICD-10-CM | POA: Diagnosis not present

## 2012-01-12 DIAGNOSIS — R269 Unspecified abnormalities of gait and mobility: Secondary | ICD-10-CM | POA: Diagnosis not present

## 2012-01-12 DIAGNOSIS — J8410 Pulmonary fibrosis, unspecified: Secondary | ICD-10-CM | POA: Diagnosis not present

## 2012-01-12 DIAGNOSIS — IMO0001 Reserved for inherently not codable concepts without codable children: Secondary | ICD-10-CM | POA: Diagnosis not present

## 2012-01-15 DIAGNOSIS — F329 Major depressive disorder, single episode, unspecified: Secondary | ICD-10-CM | POA: Diagnosis not present

## 2012-01-15 DIAGNOSIS — M216X9 Other acquired deformities of unspecified foot: Secondary | ICD-10-CM | POA: Diagnosis not present

## 2012-01-15 DIAGNOSIS — I69998 Other sequelae following unspecified cerebrovascular disease: Secondary | ICD-10-CM | POA: Diagnosis not present

## 2012-01-15 DIAGNOSIS — R269 Unspecified abnormalities of gait and mobility: Secondary | ICD-10-CM | POA: Diagnosis not present

## 2012-01-15 DIAGNOSIS — J8410 Pulmonary fibrosis, unspecified: Secondary | ICD-10-CM | POA: Diagnosis not present

## 2012-01-15 DIAGNOSIS — IMO0001 Reserved for inherently not codable concepts without codable children: Secondary | ICD-10-CM | POA: Diagnosis not present

## 2012-01-18 DIAGNOSIS — R269 Unspecified abnormalities of gait and mobility: Secondary | ICD-10-CM | POA: Diagnosis not present

## 2012-01-18 DIAGNOSIS — M216X9 Other acquired deformities of unspecified foot: Secondary | ICD-10-CM | POA: Diagnosis not present

## 2012-01-18 DIAGNOSIS — J8410 Pulmonary fibrosis, unspecified: Secondary | ICD-10-CM | POA: Diagnosis not present

## 2012-01-18 DIAGNOSIS — F329 Major depressive disorder, single episode, unspecified: Secondary | ICD-10-CM | POA: Diagnosis not present

## 2012-01-18 DIAGNOSIS — IMO0001 Reserved for inherently not codable concepts without codable children: Secondary | ICD-10-CM | POA: Diagnosis not present

## 2012-01-18 DIAGNOSIS — I69998 Other sequelae following unspecified cerebrovascular disease: Secondary | ICD-10-CM | POA: Diagnosis not present

## 2012-01-20 DIAGNOSIS — I69998 Other sequelae following unspecified cerebrovascular disease: Secondary | ICD-10-CM | POA: Diagnosis not present

## 2012-01-20 DIAGNOSIS — M216X9 Other acquired deformities of unspecified foot: Secondary | ICD-10-CM | POA: Diagnosis not present

## 2012-01-20 DIAGNOSIS — J8410 Pulmonary fibrosis, unspecified: Secondary | ICD-10-CM | POA: Diagnosis not present

## 2012-01-20 DIAGNOSIS — R269 Unspecified abnormalities of gait and mobility: Secondary | ICD-10-CM | POA: Diagnosis not present

## 2012-01-20 DIAGNOSIS — IMO0001 Reserved for inherently not codable concepts without codable children: Secondary | ICD-10-CM | POA: Diagnosis not present

## 2012-01-20 DIAGNOSIS — F329 Major depressive disorder, single episode, unspecified: Secondary | ICD-10-CM | POA: Diagnosis not present

## 2012-02-14 DIAGNOSIS — Z23 Encounter for immunization: Secondary | ICD-10-CM | POA: Diagnosis not present

## 2012-03-04 DIAGNOSIS — R42 Dizziness and giddiness: Secondary | ICD-10-CM | POA: Diagnosis not present

## 2012-03-04 DIAGNOSIS — Z0000 Encounter for general adult medical examination without abnormal findings: Secondary | ICD-10-CM | POA: Diagnosis not present

## 2012-03-04 DIAGNOSIS — F325 Major depressive disorder, single episode, in full remission: Secondary | ICD-10-CM | POA: Diagnosis not present

## 2012-03-04 DIAGNOSIS — E039 Hypothyroidism, unspecified: Secondary | ICD-10-CM | POA: Diagnosis not present

## 2012-03-04 DIAGNOSIS — M810 Age-related osteoporosis without current pathological fracture: Secondary | ICD-10-CM | POA: Diagnosis not present

## 2012-03-04 DIAGNOSIS — Z1331 Encounter for screening for depression: Secondary | ICD-10-CM | POA: Diagnosis not present

## 2012-03-16 DIAGNOSIS — N3941 Urge incontinence: Secondary | ICD-10-CM | POA: Diagnosis not present

## 2012-03-16 DIAGNOSIS — N952 Postmenopausal atrophic vaginitis: Secondary | ICD-10-CM | POA: Diagnosis not present

## 2012-03-16 DIAGNOSIS — R350 Frequency of micturition: Secondary | ICD-10-CM | POA: Diagnosis not present

## 2012-04-27 DIAGNOSIS — L821 Other seborrheic keratosis: Secondary | ICD-10-CM | POA: Diagnosis not present

## 2012-04-27 DIAGNOSIS — D0460 Carcinoma in situ of skin of unspecified upper limb, including shoulder: Secondary | ICD-10-CM | POA: Diagnosis not present

## 2012-04-27 DIAGNOSIS — D1739 Benign lipomatous neoplasm of skin and subcutaneous tissue of other sites: Secondary | ICD-10-CM | POA: Diagnosis not present

## 2012-04-27 DIAGNOSIS — M810 Age-related osteoporosis without current pathological fracture: Secondary | ICD-10-CM | POA: Diagnosis not present

## 2012-04-27 DIAGNOSIS — L820 Inflamed seborrheic keratosis: Secondary | ICD-10-CM | POA: Diagnosis not present

## 2012-05-10 DIAGNOSIS — D0460 Carcinoma in situ of skin of unspecified upper limb, including shoulder: Secondary | ICD-10-CM | POA: Diagnosis not present

## 2012-06-15 DIAGNOSIS — N3941 Urge incontinence: Secondary | ICD-10-CM | POA: Diagnosis not present

## 2012-06-15 DIAGNOSIS — R350 Frequency of micturition: Secondary | ICD-10-CM | POA: Diagnosis not present

## 2012-06-28 DIAGNOSIS — K219 Gastro-esophageal reflux disease without esophagitis: Secondary | ICD-10-CM | POA: Diagnosis not present

## 2012-06-28 DIAGNOSIS — M810 Age-related osteoporosis without current pathological fracture: Secondary | ICD-10-CM | POA: Diagnosis not present

## 2012-06-28 DIAGNOSIS — F325 Major depressive disorder, single episode, in full remission: Secondary | ICD-10-CM | POA: Diagnosis not present

## 2012-07-12 ENCOUNTER — Emergency Department (HOSPITAL_COMMUNITY): Payer: Medicare Other

## 2012-07-12 ENCOUNTER — Encounter (HOSPITAL_COMMUNITY): Payer: Self-pay | Admitting: *Deleted

## 2012-07-12 ENCOUNTER — Emergency Department (HOSPITAL_COMMUNITY)
Admission: EM | Admit: 2012-07-12 | Discharge: 2012-07-12 | Disposition: A | Discharge status: Home / Self Care | Payer: Medicare Other | Attending: Emergency Medicine | Admitting: Emergency Medicine

## 2012-07-12 DIAGNOSIS — N390 Urinary tract infection, site not specified: Secondary | ICD-10-CM | POA: Insufficient documentation

## 2012-07-12 DIAGNOSIS — K219 Gastro-esophageal reflux disease without esophagitis: Secondary | ICD-10-CM | POA: Diagnosis not present

## 2012-07-12 DIAGNOSIS — I2510 Atherosclerotic heart disease of native coronary artery without angina pectoris: Secondary | ICD-10-CM | POA: Diagnosis not present

## 2012-07-12 DIAGNOSIS — R531 Weakness: Secondary | ICD-10-CM

## 2012-07-12 DIAGNOSIS — E039 Hypothyroidism, unspecified: Secondary | ICD-10-CM | POA: Diagnosis not present

## 2012-07-12 DIAGNOSIS — Z9861 Coronary angioplasty status: Secondary | ICD-10-CM | POA: Insufficient documentation

## 2012-07-12 DIAGNOSIS — Z951 Presence of aortocoronary bypass graft: Secondary | ICD-10-CM | POA: Diagnosis not present

## 2012-07-12 DIAGNOSIS — Z87448 Personal history of other diseases of urinary system: Secondary | ICD-10-CM | POA: Diagnosis not present

## 2012-07-12 DIAGNOSIS — Z87442 Personal history of urinary calculi: Secondary | ICD-10-CM | POA: Insufficient documentation

## 2012-07-12 DIAGNOSIS — Z8781 Personal history of (healed) traumatic fracture: Secondary | ICD-10-CM | POA: Diagnosis not present

## 2012-07-12 DIAGNOSIS — R110 Nausea: Secondary | ICD-10-CM | POA: Insufficient documentation

## 2012-07-12 DIAGNOSIS — E7800 Pure hypercholesterolemia, unspecified: Secondary | ICD-10-CM | POA: Diagnosis not present

## 2012-07-12 DIAGNOSIS — Z8619 Personal history of other infectious and parasitic diseases: Secondary | ICD-10-CM | POA: Diagnosis not present

## 2012-07-12 DIAGNOSIS — Z7982 Long term (current) use of aspirin: Secondary | ICD-10-CM | POA: Diagnosis not present

## 2012-07-12 DIAGNOSIS — Z79899 Other long term (current) drug therapy: Secondary | ICD-10-CM | POA: Diagnosis not present

## 2012-07-12 DIAGNOSIS — R5383 Other fatigue: Secondary | ICD-10-CM | POA: Diagnosis not present

## 2012-07-12 DIAGNOSIS — R5381 Other malaise: Secondary | ICD-10-CM | POA: Diagnosis not present

## 2012-07-12 DIAGNOSIS — Z8673 Personal history of transient ischemic attack (TIA), and cerebral infarction without residual deficits: Secondary | ICD-10-CM | POA: Insufficient documentation

## 2012-07-12 DIAGNOSIS — F329 Major depressive disorder, single episode, unspecified: Secondary | ICD-10-CM | POA: Diagnosis not present

## 2012-07-12 DIAGNOSIS — Z8709 Personal history of other diseases of the respiratory system: Secondary | ICD-10-CM | POA: Diagnosis not present

## 2012-07-12 DIAGNOSIS — Z96649 Presence of unspecified artificial hip joint: Secondary | ICD-10-CM | POA: Diagnosis not present

## 2012-07-12 DIAGNOSIS — F3289 Other specified depressive episodes: Secondary | ICD-10-CM | POA: Insufficient documentation

## 2012-07-12 DIAGNOSIS — Z7902 Long term (current) use of antithrombotics/antiplatelets: Secondary | ICD-10-CM | POA: Diagnosis not present

## 2012-07-12 DIAGNOSIS — Z8679 Personal history of other diseases of the circulatory system: Secondary | ICD-10-CM | POA: Diagnosis not present

## 2012-07-12 DIAGNOSIS — R42 Dizziness and giddiness: Secondary | ICD-10-CM | POA: Insufficient documentation

## 2012-07-12 LAB — CBC WITH DIFFERENTIAL/PLATELET
Basophils Relative: 0 % (ref 0–1)
HCT: 36.1 % (ref 36.0–46.0)
Hemoglobin: 12 g/dL (ref 12.0–15.0)
MCH: 31 pg (ref 26.0–34.0)
MCHC: 33.2 g/dL (ref 30.0–36.0)
MCV: 93.3 fL (ref 78.0–100.0)
Monocytes Absolute: 0.5 10*3/uL (ref 0.1–1.0)
Monocytes Relative: 7 % (ref 3–12)
Neutro Abs: 3.5 10*3/uL (ref 1.7–7.7)

## 2012-07-12 LAB — POCT I-STAT, CHEM 8
Calcium, Ion: 1.21 mmol/L (ref 1.13–1.30)
Chloride: 99 mEq/L (ref 96–112)
Glucose, Bld: 93 mg/dL (ref 70–99)
HCT: 36 % (ref 36.0–46.0)

## 2012-07-12 LAB — URINALYSIS, ROUTINE W REFLEX MICROSCOPIC
Bilirubin Urine: NEGATIVE
Glucose, UA: NEGATIVE mg/dL
Ketones, ur: NEGATIVE mg/dL
Nitrite: NEGATIVE
pH: 7.5 (ref 5.0–8.0)

## 2012-07-12 MED ORDER — ONDANSETRON 4 MG PO TBDP: ORAL_TABLET | ORAL | Status: DC

## 2012-07-12 MED ORDER — HYDROCODONE-ACETAMINOPHEN 5-325 MG PO TABS: ORAL_TABLET | ORAL | Status: DC

## 2012-07-12 MED ORDER — ONDANSETRON HCL 4 MG/2ML IJ SOLN
4.0000 mg | Freq: Once | INTRAMUSCULAR | Status: AC
  Administered 2012-07-12: 4 mg via INTRAVENOUS
  Filled 2012-07-12: qty 2

## 2012-07-12 MED ORDER — HYDROMORPHONE HCL PF 1 MG/ML IJ SOLN
0.5000 mg | Freq: Once | INTRAMUSCULAR | Status: AC
  Administered 2012-07-12: 0.5 mg via INTRAVENOUS
  Filled 2012-07-12: qty 1

## 2012-07-12 NOTE — ED Provider Notes (Signed)
History     CSN: [id number]  Arrival date & time 07/12/12  1200   First MD Initiated Contact with Patient 07/12/12 1406      Chief Complaint  Patient presents with  . Headache  . Dizziness  . Nausea    (Consider location/radiation/quality/duration/timing/severity/associated sxs/prior treatment) Patient is a 79 y.o. female presenting with headaches. The history is provided by the patient (the pt complains of a headache and weakness with dizziness).  Headache Pain location:  Generalized Quality:  Dull Radiates to:  Does not radiate Severity currently:  2/10 Severity at highest:  4/10 Onset quality:  Gradual Timing:  Intermittent Progression:  Partially resolved Chronicity:  New Similar to prior headaches: no   Context: not activity   Associated symptoms: no abdominal pain, no back pain, no congestion, no cough, no diarrhea, no fatigue, no seizures and no sinus pressure     Past Medical History  Diagnosis Date  . Coronary artery disease     DR. Alanda Amass IS PT'S CARDIOLOGIST - HX CABG AND HAS STENT AND TOLD SHE HAS ANOTHER BLOCKAGE-BEING TREATED MEDICALLY  . High cholesterol   . Lung nodules     TOLD SHE HAS INTERSTITUAL LUNG DISEASE  . Shortness of breath     AND WHEEZING AT TIMES--NO INHALERS  . Vertigo   . Arthritis   . GERD (gastroesophageal reflux disease)   . Urinary incontinence   . UTI (lower urinary tract infection)     HX OF UTI'S-- LAST TIME COUPLE OF MONTHS AGO  . Hypothyroidism   . Blood transfusion 2007    AFTER HIP REPLACEMENT  . Fracture FEB 2013    FRACTURED LEFT ANKLE--NO SURGERY--WAS IN BOOT UNTIL COUPLE WEEKS AGO  . Depression   . Carotid bruit     PT'S DAUGHTER STATES PT HAD RECENT CAROTID STUDY AND WAS TOLD NO SIGNIFICANT BLOCKAGES  . [place] virus     COUPLE OF WEEKS AGO--ALL SYMTOMS RESOLVED PER PT  . Kidney stones     IN THE PAST  . Stroke     SEVERAL STROKES -TOLD SHE HAS CEREBELLUM BLOCKAGE AS RESULT OF STROKE--BUT NEUROLOGIST SAID  HE DID NOT WANT TO DO ANY PROCEDURE BECAUSE OF HER AGE .  PT HAS SLIGHT LEFT FOOT DROP-DRAGS FOOT WHEN WALKING - USES WALKER    Past Surgical History  Procedure Laterality Date  . Coronary angioplasty  10/2010    WITH STENT PLACEMENT  . Coronary artery bypass graft  2009  . Joint replacement  2007    HIP REPLACEMENT -LEFT  . Bladder tack      1969 AND 1990  . Cystoscopy with injection  09/21/2011    Procedure: CYSTOSCOPY WITH INJECTION;  Surgeon: Kathi Ludwig, MD;  Location: [facility];  Service: Urology;  Laterality: N/A;  Peri Urethral Macroplastique Injection and Estring Placement    Family History  Problem Relation Age of Onset  . Heart disease Mother     History  Substance Use Topics  . Smoking status: Never Smoker   . Smokeless tobacco: Never Used  . Alcohol Use: No    OB History   Grav Para Term Preterm Abortions TAB SAB Ect Mult Living                  Review of Systems  Constitutional: Negative for fatigue.  HENT: Negative for congestion, sinus pressure and ear discharge.   Eyes: Negative for discharge.  Respiratory: Negative for cough.   Cardiovascular: Negative for chest pain.  Gastrointestinal: Negative for abdominal pain and diarrhea.  Genitourinary: Negative for frequency and hematuria.  Musculoskeletal: Negative for back pain.  Skin: Negative for rash.  Neurological: Positive for headaches. Negative for seizures.  Psychiatric/Behavioral: Negative for hallucinations.    Allergies  Sulfonamide derivatives  Home Medications   Current Outpatient Rx  Name  Route  Sig  Dispense  Refill  . aspirin EC 81 MG tablet   Oral   Take 81 mg by mouth daily with breakfast.         . Calcium Carb-Cholecalciferol (CALCIUM 1000 + D PO)   Oral   Take 2 tablets by mouth daily.         . clopidogrel (PLAVIX) 75 MG tablet   Oral   Take 75 mg by mouth daily with breakfast.         . fish oil-omega-3 fatty acids 1000 MG capsule   Oral   Take 1 g by  mouth daily with breakfast.         . levothyroxine (SYNTHROID, LEVOTHROID) 50 MCG tablet   Oral   Take 50 mcg by mouth daily with breakfast.         . meclizine (ANTIVERT) 25 MG tablet   Oral   Take 25 mg by mouth 3 (three) times daily as needed for dizziness.         . mirabegron ER (MYRBETRIQ) 50 MG TB24   Oral   Take 50 mg by mouth 2 (two) times daily.          . Multiple Vitamin (MULITIVITAMIN WITH MINERALS) TABS   Oral   Take 1 tablet by mouth daily with breakfast.         . OVER THE COUNTER MEDICATION   Oral   Take 2 capsules by mouth daily. Immuplex  Immune Booster         . pantoprazole (PROTONIX) 40 MG tablet   Oral   Take 40 mg by mouth daily with breakfast.         . rosuvastatin (CRESTOR) 20 MG tablet   Oral   Take 20 mg by mouth every evening.          . sertraline (ZOLOFT) 50 MG tablet   Oral   Take 50 mg by mouth every evening. Patient takes 1 and 1/2 tablets by mouth every day         . trimethoprim (TRIMPEX) 100 MG tablet   Oral   Take 100 mg by mouth every other day.         Marland Kitchen HYDROcodone-acetaminophen (NORCO/VICODIN) 5-325 MG per tablet      Take one half tablet every 4-6 hours for severe headache   10 tablet   0   . ondansetron (ZOFRAN ODT) 4 MG disintegrating tablet      Take one every 6 hours for nauseau   12 tablet   0     BP 134/57  Pulse 78  Temp(Src) 98 F (36.7 C) (Oral)  Resp 18  Ht 5\' 1"  (1.549 m)  Wt 168 lb (76.204 kg)  BMI 31.76 kg/m2  SpO2 97%  Physical Exam  Constitutional: She is oriented to person, place, and time. She appears well-developed.  HENT:  Head: Normocephalic and atraumatic.  Eyes: Conjunctivae and EOM are normal. No scleral icterus.  Neck: Neck supple. No thyromegaly present.  Cardiovascular: Normal rate and regular rhythm.  Exam reveals no gallop and no friction rub.   No murmur heard. Pulmonary/Chest: No stridor. She has  no wheezes. She has no rales. She exhibits no tenderness.   Abdominal: She exhibits no distension. There is no tenderness. There is no rebound.  Musculoskeletal: Normal range of motion. She exhibits no edema.  Lymphadenopathy:    She has no cervical adenopathy.  Neurological: She is oriented to person, place, and time. Coordination normal.  Skin: No rash noted. No erythema.  Psychiatric: She has a normal mood and affect. Her behavior is normal.    ED Course  Procedures (including critical care time)  Labs Reviewed  CBC WITH DIFFERENTIAL  URINALYSIS, ROUTINE W REFLEX MICROSCOPIC  POCT I-STAT, CHEM 8   Ct Head Wo Contrast  07/12/2012  *RADIOLOGY REPORT*  Clinical Data: History of multiple strokes.  Weakness.  CT HEAD WITHOUT CONTRAST  Technique:  Contiguous axial images were obtained from the base of the skull through the vertex without contrast.  Comparison: 11/13/2011  Findings: Chronic ischemic changes in the periventricular white matter and bilateral basal ganglia are stable.  Global atrophy is stable.  No mass effect, midline shift, or acute intracranial hemorrhage.  There is new encephalomalacia in the periventricular white matter of the right corona radiata compatible with an evolving stroke seen on the previous MR.  Mastoid air cells and visualized paranasal sinuses are clear.  Cranium is intact.  IMPRESSION: No acute intracranial pathology.   Original Report Authenticated By: Jolaine Click, M.D.      1. Weakness       MDM          Benny Lennert, MD 07/12/12 [phone]

## 2012-07-12 NOTE — ED Notes (Addendum)
Pt c/o R frontal headache since last night that increased when she began to exercise this am.  She began feeling dizzy and nauseated as well, and took an aleve at 0930 and  25 mg of meclizine 1030 with no relief.  Denies changes in vision or weakness.  Neuro intact.  Light increases dizziness.

## 2012-07-14 DIAGNOSIS — R42 Dizziness and giddiness: Secondary | ICD-10-CM | POA: Diagnosis not present

## 2012-07-14 DIAGNOSIS — R110 Nausea: Secondary | ICD-10-CM | POA: Diagnosis not present

## 2012-07-27 ENCOUNTER — Other Ambulatory Visit: Payer: Self-pay | Admitting: Internal Medicine

## 2012-07-27 DIAGNOSIS — R42 Dizziness and giddiness: Secondary | ICD-10-CM

## 2012-07-31 ENCOUNTER — Ambulatory Visit
Admission: RE | Admit: 2012-07-31 | Discharge: 2012-07-31 | Disposition: A | Discharge status: Home / Self Care | Payer: Medicare Other | Source: Ambulatory Visit | Attending: Internal Medicine | Admitting: Internal Medicine

## 2012-07-31 DIAGNOSIS — R42 Dizziness and giddiness: Secondary | ICD-10-CM | POA: Diagnosis not present

## 2012-07-31 DIAGNOSIS — R110 Nausea: Secondary | ICD-10-CM

## 2012-07-31 DIAGNOSIS — I671 Cerebral aneurysm, nonruptured: Secondary | ICD-10-CM | POA: Diagnosis not present

## 2012-07-31 MED ORDER — GADOBENATE DIMEGLUMINE 529 MG/ML IV SOLN
15.0000 mL | Freq: Once | INTRAVENOUS | Status: AC | PRN
  Administered 2012-07-31: 15 mL via INTRAVENOUS

## 2012-08-01 ENCOUNTER — Inpatient Hospital Stay (HOSPITAL_COMMUNITY): Admission: RE | Admit: 2012-08-01 | Payer: Medicare Other | Source: Ambulatory Visit

## 2012-08-12 ENCOUNTER — Encounter (HOSPITAL_COMMUNITY): Payer: Medicare Other

## 2012-08-31 DIAGNOSIS — H02839 Dermatochalasis of unspecified eye, unspecified eyelid: Secondary | ICD-10-CM | POA: Diagnosis not present

## 2012-08-31 DIAGNOSIS — H26499 Other secondary cataract, unspecified eye: Secondary | ICD-10-CM | POA: Diagnosis not present

## 2012-11-24 DIAGNOSIS — M76899 Other specified enthesopathies of unspecified lower limb, excluding foot: Secondary | ICD-10-CM | POA: Diagnosis not present

## 2012-12-07 ENCOUNTER — Ambulatory Visit
Admission: RE | Admit: 2012-12-07 | Discharge: 2012-12-07 | Disposition: A | Discharge status: Home / Self Care | Payer: Medicare Other | Source: Ambulatory Visit | Attending: Orthopedic Surgery | Admitting: Orthopedic Surgery

## 2012-12-07 ENCOUNTER — Other Ambulatory Visit: Payer: Self-pay | Admitting: Orthopedic Surgery

## 2012-12-07 DIAGNOSIS — N3941 Urge incontinence: Secondary | ICD-10-CM | POA: Diagnosis not present

## 2012-12-07 DIAGNOSIS — I6789 Other cerebrovascular disease: Secondary | ICD-10-CM | POA: Diagnosis not present

## 2012-12-07 DIAGNOSIS — N3020 Other chronic cystitis without hematuria: Secondary | ICD-10-CM | POA: Diagnosis not present

## 2012-12-07 DIAGNOSIS — E782 Mixed hyperlipidemia: Secondary | ICD-10-CM | POA: Diagnosis not present

## 2012-12-07 DIAGNOSIS — M76899 Other specified enthesopathies of unspecified lower limb, excluding foot: Secondary | ICD-10-CM | POA: Diagnosis not present

## 2012-12-07 DIAGNOSIS — R079 Chest pain, unspecified: Secondary | ICD-10-CM

## 2012-12-10 ENCOUNTER — Encounter: Payer: Self-pay | Admitting: Cardiovascular Disease

## 2012-12-12 DIAGNOSIS — M546 Pain in thoracic spine: Secondary | ICD-10-CM | POA: Diagnosis not present

## 2012-12-14 ENCOUNTER — Telehealth: Payer: Self-pay | Admitting: Cardiovascular Disease

## 2012-12-14 DIAGNOSIS — M545 Low back pain: Secondary | ICD-10-CM | POA: Diagnosis not present

## 2012-12-14 DIAGNOSIS — M47817 Spondylosis without myelopathy or radiculopathy, lumbosacral region: Secondary | ICD-10-CM | POA: Diagnosis not present

## 2012-12-14 NOTE — Telephone Encounter (Signed)
Please call [facility] with the info on the type of stent Mr. Sebree has. [phone] (phone) [phone] (Fax)

## 2012-12-14 NOTE — Telephone Encounter (Signed)
All information ie cath note, most recent MRI ,echo and most recent office note faxed to [facility]

## 2012-12-15 DIAGNOSIS — M545 Low back pain, unspecified: Secondary | ICD-10-CM | POA: Diagnosis not present

## 2012-12-15 DIAGNOSIS — S335XXA Sprain of ligaments of lumbar spine, initial encounter: Secondary | ICD-10-CM | POA: Diagnosis not present

## 2012-12-16 DIAGNOSIS — M545 Low back pain: Secondary | ICD-10-CM | POA: Diagnosis not present

## 2012-12-23 DIAGNOSIS — M545 Low back pain: Secondary | ICD-10-CM | POA: Diagnosis not present

## 2013-01-04 DIAGNOSIS — I69959 Hemiplegia and hemiparesis following unspecified cerebrovascular disease affecting unspecified side: Secondary | ICD-10-CM | POA: Diagnosis not present

## 2013-01-04 DIAGNOSIS — IMO0001 Reserved for inherently not codable concepts without codable children: Secondary | ICD-10-CM | POA: Diagnosis not present

## 2013-01-04 DIAGNOSIS — M545 Low back pain: Secondary | ICD-10-CM | POA: Diagnosis not present

## 2013-01-04 DIAGNOSIS — R269 Unspecified abnormalities of gait and mobility: Secondary | ICD-10-CM | POA: Diagnosis not present

## 2013-01-04 DIAGNOSIS — Z7982 Long term (current) use of aspirin: Secondary | ICD-10-CM | POA: Diagnosis not present

## 2013-01-06 ENCOUNTER — Other Ambulatory Visit (HOSPITAL_COMMUNITY): Payer: Self-pay | Admitting: Orthopaedic Surgery

## 2013-01-06 DIAGNOSIS — M545 Low back pain, unspecified: Secondary | ICD-10-CM | POA: Diagnosis not present

## 2013-01-06 DIAGNOSIS — IMO0001 Reserved for inherently not codable concepts without codable children: Secondary | ICD-10-CM | POA: Diagnosis not present

## 2013-01-06 DIAGNOSIS — M47817 Spondylosis without myelopathy or radiculopathy, lumbosacral region: Secondary | ICD-10-CM | POA: Diagnosis not present

## 2013-01-06 DIAGNOSIS — R269 Unspecified abnormalities of gait and mobility: Secondary | ICD-10-CM | POA: Diagnosis not present

## 2013-01-06 DIAGNOSIS — I69959 Hemiplegia and hemiparesis following unspecified cerebrovascular disease affecting unspecified side: Secondary | ICD-10-CM | POA: Diagnosis not present

## 2013-01-06 DIAGNOSIS — M76899 Other specified enthesopathies of unspecified lower limb, excluding foot: Secondary | ICD-10-CM | POA: Diagnosis not present

## 2013-01-06 DIAGNOSIS — Z7982 Long term (current) use of aspirin: Secondary | ICD-10-CM | POA: Diagnosis not present

## 2013-01-06 DIAGNOSIS — Z139 Encounter for screening, unspecified: Secondary | ICD-10-CM

## 2013-01-06 DIAGNOSIS — Z136 Encounter for screening for cardiovascular disorders: Secondary | ICD-10-CM

## 2013-01-10 DIAGNOSIS — M549 Dorsalgia, unspecified: Secondary | ICD-10-CM | POA: Diagnosis not present

## 2013-01-10 DIAGNOSIS — N3020 Other chronic cystitis without hematuria: Secondary | ICD-10-CM | POA: Diagnosis not present

## 2013-01-10 DIAGNOSIS — M545 Low back pain: Secondary | ICD-10-CM | POA: Diagnosis not present

## 2013-01-10 DIAGNOSIS — N281 Cyst of kidney, acquired: Secondary | ICD-10-CM | POA: Diagnosis not present

## 2013-01-10 DIAGNOSIS — I69959 Hemiplegia and hemiparesis following unspecified cerebrovascular disease affecting unspecified side: Secondary | ICD-10-CM | POA: Diagnosis not present

## 2013-01-10 DIAGNOSIS — IMO0001 Reserved for inherently not codable concepts without codable children: Secondary | ICD-10-CM | POA: Diagnosis not present

## 2013-01-10 DIAGNOSIS — R269 Unspecified abnormalities of gait and mobility: Secondary | ICD-10-CM | POA: Diagnosis not present

## 2013-01-10 DIAGNOSIS — Z7982 Long term (current) use of aspirin: Secondary | ICD-10-CM | POA: Diagnosis not present

## 2013-01-12 DIAGNOSIS — IMO0001 Reserved for inherently not codable concepts without codable children: Secondary | ICD-10-CM | POA: Diagnosis not present

## 2013-01-12 DIAGNOSIS — Z7982 Long term (current) use of aspirin: Secondary | ICD-10-CM | POA: Diagnosis not present

## 2013-01-12 DIAGNOSIS — M545 Low back pain: Secondary | ICD-10-CM | POA: Diagnosis not present

## 2013-01-12 DIAGNOSIS — I69959 Hemiplegia and hemiparesis following unspecified cerebrovascular disease affecting unspecified side: Secondary | ICD-10-CM | POA: Diagnosis not present

## 2013-01-12 DIAGNOSIS — R269 Unspecified abnormalities of gait and mobility: Secondary | ICD-10-CM | POA: Diagnosis not present

## 2013-01-16 DIAGNOSIS — IMO0001 Reserved for inherently not codable concepts without codable children: Secondary | ICD-10-CM | POA: Diagnosis not present

## 2013-01-16 DIAGNOSIS — R269 Unspecified abnormalities of gait and mobility: Secondary | ICD-10-CM | POA: Diagnosis not present

## 2013-01-16 DIAGNOSIS — Z7982 Long term (current) use of aspirin: Secondary | ICD-10-CM | POA: Diagnosis not present

## 2013-01-16 DIAGNOSIS — I69959 Hemiplegia and hemiparesis following unspecified cerebrovascular disease affecting unspecified side: Secondary | ICD-10-CM | POA: Diagnosis not present

## 2013-01-16 DIAGNOSIS — M545 Low back pain: Secondary | ICD-10-CM | POA: Diagnosis not present

## 2013-01-19 ENCOUNTER — Encounter (HOSPITAL_COMMUNITY)
Admission: RE | Admit: 2013-01-19 | Discharge: 2013-01-19 | Disposition: A | Discharge status: Home / Self Care | Payer: Medicare Other | Source: Ambulatory Visit | Attending: Orthopaedic Surgery | Admitting: Orthopaedic Surgery

## 2013-01-19 ENCOUNTER — Ambulatory Visit (HOSPITAL_COMMUNITY)
Admission: RE | Admit: 2013-01-19 | Discharge: 2013-01-19 | Disposition: A | Discharge status: Home / Self Care | Payer: Medicare Other | Source: Ambulatory Visit | Attending: Orthopaedic Surgery | Admitting: Orthopaedic Surgery

## 2013-01-19 DIAGNOSIS — I700 Atherosclerosis of aorta: Secondary | ICD-10-CM | POA: Insufficient documentation

## 2013-01-19 DIAGNOSIS — M545 Low back pain, unspecified: Secondary | ICD-10-CM | POA: Insufficient documentation

## 2013-01-19 DIAGNOSIS — M546 Pain in thoracic spine: Secondary | ICD-10-CM | POA: Insufficient documentation

## 2013-01-19 DIAGNOSIS — S22009A Unspecified fracture of unspecified thoracic vertebra, initial encounter for closed fracture: Secondary | ICD-10-CM | POA: Diagnosis not present

## 2013-01-19 DIAGNOSIS — Z136 Encounter for screening for cardiovascular disorders: Secondary | ICD-10-CM

## 2013-01-19 MED ORDER — TECHNETIUM TC 99M MEDRONATE IV KIT
25.0000 | PACK | Freq: Once | INTRAVENOUS | Status: AC | PRN
  Administered 2013-01-19: 25 via INTRAVENOUS

## 2013-01-20 ENCOUNTER — Encounter (HOSPITAL_COMMUNITY): Payer: Self-pay | Admitting: Pharmacy Technician

## 2013-01-20 ENCOUNTER — Other Ambulatory Visit (HOSPITAL_COMMUNITY): Payer: Self-pay | Admitting: Interventional Radiology

## 2013-01-20 ENCOUNTER — Other Ambulatory Visit: Payer: Self-pay | Admitting: Radiology

## 2013-01-20 DIAGNOSIS — IMO0002 Reserved for concepts with insufficient information to code with codable children: Secondary | ICD-10-CM

## 2013-01-20 DIAGNOSIS — M549 Dorsalgia, unspecified: Secondary | ICD-10-CM

## 2013-01-24 ENCOUNTER — Encounter (HOSPITAL_COMMUNITY): Payer: Self-pay

## 2013-01-24 ENCOUNTER — Ambulatory Visit (HOSPITAL_COMMUNITY)
Admission: RE | Admit: 2013-01-24 | Discharge: 2013-01-24 | Disposition: A | Discharge status: Home / Self Care | Payer: Medicare Other | Source: Ambulatory Visit | Attending: Interventional Radiology | Admitting: Interventional Radiology

## 2013-01-24 DIAGNOSIS — X500XXA Overexertion from strenuous movement or load, initial encounter: Secondary | ICD-10-CM | POA: Insufficient documentation

## 2013-01-24 DIAGNOSIS — Z8673 Personal history of transient ischemic attack (TIA), and cerebral infarction without residual deficits: Secondary | ICD-10-CM | POA: Insufficient documentation

## 2013-01-24 DIAGNOSIS — M216X9 Other acquired deformities of unspecified foot: Secondary | ICD-10-CM | POA: Diagnosis not present

## 2013-01-24 DIAGNOSIS — M549 Dorsalgia, unspecified: Secondary | ICD-10-CM

## 2013-01-24 DIAGNOSIS — Z96649 Presence of unspecified artificial hip joint: Secondary | ICD-10-CM | POA: Diagnosis not present

## 2013-01-24 DIAGNOSIS — F329 Major depressive disorder, single episode, unspecified: Secondary | ICD-10-CM | POA: Insufficient documentation

## 2013-01-24 DIAGNOSIS — K219 Gastro-esophageal reflux disease without esophagitis: Secondary | ICD-10-CM | POA: Insufficient documentation

## 2013-01-24 DIAGNOSIS — Y998 Other external cause status: Secondary | ICD-10-CM | POA: Insufficient documentation

## 2013-01-24 DIAGNOSIS — S22009A Unspecified fracture of unspecified thoracic vertebra, initial encounter for closed fracture: Secondary | ICD-10-CM | POA: Insufficient documentation

## 2013-01-24 DIAGNOSIS — IMO0002 Reserved for concepts with insufficient information to code with codable children: Secondary | ICD-10-CM

## 2013-01-24 DIAGNOSIS — E039 Hypothyroidism, unspecified: Secondary | ICD-10-CM | POA: Insufficient documentation

## 2013-01-24 DIAGNOSIS — E7800 Pure hypercholesterolemia, unspecified: Secondary | ICD-10-CM | POA: Insufficient documentation

## 2013-01-24 DIAGNOSIS — M439 Deforming dorsopathy, unspecified: Secondary | ICD-10-CM | POA: Diagnosis not present

## 2013-01-24 DIAGNOSIS — Y92009 Unspecified place in unspecified non-institutional (private) residence as the place of occurrence of the external cause: Secondary | ICD-10-CM | POA: Insufficient documentation

## 2013-01-24 DIAGNOSIS — M8448XA Pathological fracture, other site, initial encounter for fracture: Secondary | ICD-10-CM | POA: Diagnosis not present

## 2013-01-24 DIAGNOSIS — I2510 Atherosclerotic heart disease of native coronary artery without angina pectoris: Secondary | ICD-10-CM | POA: Insufficient documentation

## 2013-01-24 DIAGNOSIS — M129 Arthropathy, unspecified: Secondary | ICD-10-CM | POA: Insufficient documentation

## 2013-01-24 DIAGNOSIS — F3289 Other specified depressive episodes: Secondary | ICD-10-CM | POA: Insufficient documentation

## 2013-01-24 LAB — BASIC METABOLIC PANEL
BUN: 15 mg/dL (ref 6–23)
CO2: 29 mEq/L (ref 19–32)
Calcium: 9.7 mg/dL (ref 8.4–10.5)
GFR calc non Af Amer: 84 mL/min — ABNORMAL LOW (ref >90)
Glucose, Bld: 103 mg/dL — ABNORMAL HIGH (ref 70–99)

## 2013-01-24 LAB — CBC
MCH: 31.4 pg (ref 26.0–34.0)
MCHC: 33.5 g/dL (ref 30.0–36.0)
MCV: 93.9 fL (ref 78.0–100.0)
Platelets: 203 10*3/uL (ref 150–400)

## 2013-01-24 LAB — PROTIME-INR: Prothrombin Time: 12 seconds (ref 11.6–15.2)

## 2013-01-24 MED ORDER — SODIUM CHLORIDE 0.9 % IV SOLN: INTRAVENOUS | Status: DC

## 2013-01-24 MED ORDER — IOHEXOL 300 MG/ML  SOLN
50.0000 mL | Freq: Once | INTRAMUSCULAR | Status: AC | PRN
  Administered 2013-01-24: 5 mL

## 2013-01-24 MED ORDER — MIDAZOLAM HCL 2 MG/2ML IJ SOLN
INTRAMUSCULAR | Status: AC
  Filled 2013-01-24: qty 6

## 2013-01-24 MED ORDER — FENTANYL CITRATE 0.05 MG/ML IJ SOLN
INTRAMUSCULAR | Status: AC | PRN
  Administered 2013-01-24 (×3): 25 ug via INTRAVENOUS

## 2013-01-24 MED ORDER — TOBRAMYCIN SULFATE 1.2 G IJ SOLR
INTRAMUSCULAR | Status: AC
  Filled 2013-01-24: qty 1.2

## 2013-01-24 MED ORDER — FENTANYL CITRATE 0.05 MG/ML IJ SOLN
INTRAMUSCULAR | Status: AC
  Filled 2013-01-24: qty 4

## 2013-01-24 MED ORDER — MIDAZOLAM HCL 2 MG/2ML IJ SOLN
INTRAMUSCULAR | Status: AC | PRN
  Administered 2013-01-24 (×3): 1 mg via INTRAVENOUS

## 2013-01-24 MED ORDER — HYDROMORPHONE HCL PF 1 MG/ML IJ SOLN
INTRAMUSCULAR | Status: AC
  Filled 2013-01-24: qty 3

## 2013-01-24 MED ORDER — SODIUM CHLORIDE 0.9 % IV SOLN
INTRAVENOUS | Status: DC
  Administered 2013-01-24: 07:00:00 via INTRAVENOUS

## 2013-01-24 MED ORDER — CEFAZOLIN SODIUM-DEXTROSE 2-3 GM-% IV SOLR
2.0000 g | INTRAVENOUS | Status: AC
  Administered 2013-01-24: 2 g via INTRAVENOUS
  Filled 2013-01-24: qty 50

## 2013-01-24 NOTE — H&P (Signed)
Gina Bowers is an 79 y.o. female.   Chief Complaint: Pt developed sudden onset pain 8 weeks ago when she got up from bed and twisted to reach for something Has had pain since then Pain some better now- can sit and rest without pain Getting up from sitting or getting out of bed still creates pain that ranks an "8" on a scale of 10 CXR and rib xrays were negative on initial work up More recent MRI shows possible fx Thoracic spine #10-per Pt and dtr- MRI performed in [facility] hospital Bone scan + 01/19/2013 Referred to Dr Gina Bowers from [place] for probable T 10 Vertebroplasty vs kyphoplasty Pt to undergo CT today prior to procedure  HPI: CAD/stents- off Plavix x 1 week; HLD; interstitial lung dz; hypothyroid; CVA  Past Medical History  Diagnosis Date  . Coronary artery disease     DR. Alanda Bowers IS PT'S CARDIOLOGIST - HX CABG AND HAS STENT AND TOLD SHE HAS ANOTHER BLOCKAGE-BEING TREATED MEDICALLY  . High cholesterol   . Lung nodules     TOLD SHE HAS INTERSTITUAL LUNG DISEASE  . Shortness of breath     AND WHEEZING AT TIMES--NO INHALERS  . Vertigo   . Arthritis   . GERD (gastroesophageal reflux disease)   . Urinary incontinence   . UTI (lower urinary tract infection)     HX OF UTI'S-- LAST TIME COUPLE OF MONTHS AGO  . Hypothyroidism   . Blood transfusion 2007    AFTER HIP REPLACEMENT  . Fracture FEB 2013    FRACTURED LEFT ANKLE--NO SURGERY--WAS IN BOOT UNTIL COUPLE WEEKS AGO  . Depression   . Carotid bruit     PT'S DAUGHTER STATES PT HAD RECENT CAROTID STUDY AND WAS TOLD NO SIGNIFICANT BLOCKAGES  . [place] virus     COUPLE OF WEEKS AGO--ALL SYMTOMS RESOLVED PER PT  . Kidney stones     IN THE PAST  . Stroke     SEVERAL STROKES -TOLD SHE HAS CEREBELLUM BLOCKAGE AS RESULT OF STROKE--BUT NEUROLOGIST SAID HE DID NOT WANT TO DO ANY PROCEDURE BECAUSE OF HER AGE .  PT HAS SLIGHT LEFT FOOT DROP-DRAGS FOOT WHEN WALKING - USES WALKER    Past Surgical History  Procedure Laterality  Date  . Coronary angioplasty  10/2010    WITH STENT PLACEMENT  . Coronary artery bypass graft  2009  . Joint replacement  2007    HIP REPLACEMENT -LEFT  . Bladder tack      1969 AND 1990  . Cystoscopy with injection  09/21/2011    Procedure: CYSTOSCOPY WITH INJECTION;  Surgeon: Gina Ludwig, MD;  Location: [facility];  Service: Urology;  Laterality: N/A;  Peri Urethral Macroplastique Injection and Estring Placement    Family History  Problem Relation Age of Onset  . Heart disease Mother    Social History:  reports that she has never smoked. She has never used smokeless tobacco. She reports that she does not drink alcohol or use illicit drugs.  Allergies:  Allergies  Allergen Reactions  . Sulfonamide Derivatives Hives     (Not in a hospital admission)  Results for orders placed during the hospital encounter of 01/24/13 (from the past 48 hour(s))  APTT     Status: None   Collection Time    01/24/13  6:39 AM      Result Value Range   aPTT 28  24 - 37 seconds  BASIC METABOLIC PANEL     Status: Abnormal   Collection Time  01/24/13  6:39 AM      Result Value Range   Sodium 136  135 - 145 mEq/L   Potassium 4.2  3.5 - 5.1 mEq/L   Chloride 99  96 - 112 mEq/L   CO2 29  19 - 32 mEq/L   Glucose, Bld 103 (*) 70 - 99 mg/dL   BUN 15  6 - 23 mg/dL   Creatinine, Ser 1.61  0.50 - 1.10 mg/dL   Calcium 9.7  8.4 - 09.6 mg/dL   GFR calc non Af Amer 84 (*) >90 mL/min   GFR calc Af Amer >90  >90 mL/min   Comment: (NOTE)     The eGFR has been calculated using the CKD EPI equation.     This calculation has not been validated in all clinical situations.     eGFR's persistently <90 mL/min signify possible Chronic Kidney     Disease.  CBC     Status: None   Collection Time    01/24/13  6:39 AM      Result Value Range   WBC 9.5  4.0 - 10.5 K/uL   RBC 4.07  3.87 - 5.11 MIL/uL   Hemoglobin 12.8  12.0 - 15.0 g/dL   HCT 04.5  40.9 - 81.1 %   MCV 93.9  78.0 - 100.0 fL   MCH 31.4  26.0 -  34.0 pg   MCHC 33.5  30.0 - 36.0 g/dL   RDW 91.4  78.2 - 95.6 %   Platelets 203  150 - 400 K/uL  PROTIME-INR     Status: None   Collection Time    01/24/13  6:39 AM      Result Value Range   Prothrombin Time 12.0  11.6 - 15.2 seconds   INR 0.90  0.00 - 1.49   No results found.  Review of Systems  Constitutional: Negative for fever and chills.  Respiratory: Negative for shortness of breath.   Cardiovascular: Negative for chest pain.  Gastrointestinal: Negative for nausea, vomiting and abdominal pain.  Musculoskeletal: Positive for back pain.  Neurological: Positive for weakness. Negative for dizziness.  Psychiatric/Behavioral: Negative for hallucinations.    Blood pressure 136/68, pulse 64, temperature 98.1 F (36.7 C), temperature source Oral, resp. rate 18, height 5\' 1"  (1.549 m), weight 170 lb (77.111 kg), SpO2 95.00%. Physical Exam  Constitutional: She is oriented to person, place, and time. She appears well-nourished.  Cardiovascular: Normal rate, regular rhythm and normal heart sounds.   No murmur heard. Respiratory: Effort normal and breath sounds normal. She has no wheezes.  GI: Soft. Bowel sounds are normal. There is no tenderness.  Musculoskeletal: Normal range of motion. She exhibits tenderness.  pt with definite pain at T10-T12 area upon palpation  Neurological: She is alert and oriented to person, place, and time. Coordination normal.  Skin: Skin is warm and dry.  Psychiatric: She has a normal mood and affect. Her behavior is normal. Judgment and thought content normal.     Assessment/Plan Twisting injury x 8 weeks ago Still with moderate to severe back pain MRI from [facility] hospital shows poss fx Bone scan 8/28 + Thoracic 10 Scheduled for limited CT now Pt scheduled for probable T 10 VP/KP Pt and dtr aware of procedure benefits and risks and agreeable to proceed Consent signed and in chart  Gina Bowers A 01/24/2013, 8:13 AM

## 2013-01-24 NOTE — ED Notes (Signed)
Patient denies pain and is resting comfortably.  

## 2013-01-24 NOTE — Procedures (Signed)
S/P T 10 KP with biopsy 

## 2013-02-02 ENCOUNTER — Other Ambulatory Visit (HOSPITAL_COMMUNITY): Payer: Self-pay | Admitting: Interventional Radiology

## 2013-02-02 DIAGNOSIS — M549 Dorsalgia, unspecified: Secondary | ICD-10-CM

## 2013-02-02 DIAGNOSIS — IMO0002 Reserved for concepts with insufficient information to code with codable children: Secondary | ICD-10-CM

## 2013-02-08 ENCOUNTER — Ambulatory Visit (HOSPITAL_COMMUNITY)
Admission: RE | Admit: 2013-02-08 | Discharge: 2013-02-08 | Disposition: A | Discharge status: Home / Self Care | Payer: Medicare Other | Source: Ambulatory Visit | Attending: Interventional Radiology | Admitting: Interventional Radiology

## 2013-02-08 DIAGNOSIS — M810 Age-related osteoporosis without current pathological fracture: Secondary | ICD-10-CM | POA: Diagnosis not present

## 2013-02-08 DIAGNOSIS — E039 Hypothyroidism, unspecified: Secondary | ICD-10-CM | POA: Diagnosis not present

## 2013-02-08 DIAGNOSIS — M549 Dorsalgia, unspecified: Secondary | ICD-10-CM | POA: Diagnosis not present

## 2013-02-08 DIAGNOSIS — S22009A Unspecified fracture of unspecified thoracic vertebra, initial encounter for closed fracture: Secondary | ICD-10-CM | POA: Diagnosis not present

## 2013-02-08 DIAGNOSIS — K219 Gastro-esophageal reflux disease without esophagitis: Secondary | ICD-10-CM | POA: Diagnosis not present

## 2013-02-08 DIAGNOSIS — IMO0002 Reserved for concepts with insufficient information to code with codable children: Secondary | ICD-10-CM

## 2013-03-14 DIAGNOSIS — N3941 Urge incontinence: Secondary | ICD-10-CM | POA: Diagnosis not present

## 2013-03-14 DIAGNOSIS — N3020 Other chronic cystitis without hematuria: Secondary | ICD-10-CM | POA: Diagnosis not present

## 2013-03-15 DIAGNOSIS — Z23 Encounter for immunization: Secondary | ICD-10-CM | POA: Diagnosis not present

## 2013-03-23 ENCOUNTER — Ambulatory Visit (INDEPENDENT_AMBULATORY_CARE_PROVIDER_SITE_OTHER): Payer: Medicare Other | Admitting: Podiatry

## 2013-03-23 ENCOUNTER — Encounter: Payer: Self-pay | Admitting: Podiatry

## 2013-03-23 VITALS — BP 156/80 | HR 80 | Resp 12 | Ht 61.0 in | Wt 170.0 lb

## 2013-03-23 DIAGNOSIS — L03039 Cellulitis of unspecified toe: Secondary | ICD-10-CM

## 2013-03-23 NOTE — Progress Notes (Signed)
N- SORE L- B/L FOOT GREAT TOENAIL D- 2 MONTHS O- SLOWLY C- SAME A- PRESSURE T- SOAK EPSON SALT

## 2013-03-23 NOTE — Patient Instructions (Signed)

## 2013-03-26 NOTE — Progress Notes (Signed)
Subjective:     Patient ID: Gina Bowers, female   DOB: [birth year], 79 y.o.   MRN: [id number]  HPI patient presents stating these nails are really sore and I cannot wear shoe gear or get them out at all. Eights that she has tried to set   Review of Systems  All other systems reviewed and are negative.       Objective:   Physical Exam  Nursing note and vitals reviewed. Constitutional: She is oriented to person, place, and time.  Neurological: She is oriented to person, place, and time.  Skin: Skin is dry.   patient has intact but diminished pulses bilateral and has incurvated nail bed hallux bilateral medial side with a small amount of distal localized redness and necrosis with no drainage     Assessment:     Paronychia infections of the hallux bilateral with distal inflammation and necrosis    Plan:     Educated her caregiver on the difficulty of this and the fact we can only do temporary type procedures infiltrated 60 mg Xylocaine Marcaine mixture into each hallux and under sterile conditions removed the medial border removed a small amount of distal abscess tissue and allowed channel for drainage sterile dressings applied and will reappoint as needed

## 2013-05-05 DIAGNOSIS — R351 Nocturia: Secondary | ICD-10-CM | POA: Diagnosis not present

## 2013-05-05 DIAGNOSIS — N3020 Other chronic cystitis without hematuria: Secondary | ICD-10-CM | POA: Diagnosis not present

## 2013-05-05 DIAGNOSIS — N3941 Urge incontinence: Secondary | ICD-10-CM | POA: Diagnosis not present

## 2013-05-31 DIAGNOSIS — N312 Flaccid neuropathic bladder, not elsewhere classified: Secondary | ICD-10-CM | POA: Diagnosis not present

## 2013-05-31 DIAGNOSIS — N3020 Other chronic cystitis without hematuria: Secondary | ICD-10-CM | POA: Diagnosis not present

## 2013-05-31 DIAGNOSIS — N3941 Urge incontinence: Secondary | ICD-10-CM | POA: Diagnosis not present

## 2013-06-19 DIAGNOSIS — N3020 Other chronic cystitis without hematuria: Secondary | ICD-10-CM | POA: Diagnosis not present

## 2013-06-19 DIAGNOSIS — N3941 Urge incontinence: Secondary | ICD-10-CM | POA: Diagnosis not present

## 2013-06-19 DIAGNOSIS — N952 Postmenopausal atrophic vaginitis: Secondary | ICD-10-CM | POA: Diagnosis not present

## 2013-06-19 DIAGNOSIS — N312 Flaccid neuropathic bladder, not elsewhere classified: Secondary | ICD-10-CM | POA: Diagnosis not present

## 2013-07-27 DIAGNOSIS — R300 Dysuria: Secondary | ICD-10-CM | POA: Diagnosis not present

## 2013-07-27 DIAGNOSIS — R350 Frequency of micturition: Secondary | ICD-10-CM | POA: Diagnosis not present

## 2013-08-08 DIAGNOSIS — M810 Age-related osteoporosis without current pathological fracture: Secondary | ICD-10-CM | POA: Diagnosis not present

## 2013-08-08 DIAGNOSIS — R300 Dysuria: Secondary | ICD-10-CM | POA: Diagnosis not present

## 2013-08-08 DIAGNOSIS — Z23 Encounter for immunization: Secondary | ICD-10-CM | POA: Diagnosis not present

## 2013-08-08 DIAGNOSIS — K219 Gastro-esophageal reflux disease without esophagitis: Secondary | ICD-10-CM | POA: Diagnosis not present

## 2013-08-08 DIAGNOSIS — Z1331 Encounter for screening for depression: Secondary | ICD-10-CM | POA: Diagnosis not present

## 2013-08-08 DIAGNOSIS — G450 Vertebro-basilar artery syndrome: Secondary | ICD-10-CM | POA: Diagnosis not present

## 2013-08-08 DIAGNOSIS — F325 Major depressive disorder, single episode, in full remission: Secondary | ICD-10-CM | POA: Diagnosis not present

## 2013-12-18 DIAGNOSIS — N312 Flaccid neuropathic bladder, not elsewhere classified: Secondary | ICD-10-CM | POA: Diagnosis not present

## 2013-12-18 DIAGNOSIS — N3000 Acute cystitis without hematuria: Secondary | ICD-10-CM | POA: Diagnosis not present

## 2013-12-18 DIAGNOSIS — L678 Other hair color and hair shaft abnormalities: Secondary | ICD-10-CM | POA: Diagnosis not present

## 2013-12-18 DIAGNOSIS — L738 Other specified follicular disorders: Secondary | ICD-10-CM | POA: Diagnosis not present

## 2013-12-18 DIAGNOSIS — N3020 Other chronic cystitis without hematuria: Secondary | ICD-10-CM | POA: Diagnosis not present

## 2013-12-27 DIAGNOSIS — N3020 Other chronic cystitis without hematuria: Secondary | ICD-10-CM | POA: Diagnosis not present

## 2014-01-10 ENCOUNTER — Telehealth: Payer: Self-pay | Admitting: Cardiovascular Disease

## 2014-01-10 MED ORDER — CLOPIDOGREL BISULFATE 75 MG PO TABS: 75.0000 mg | ORAL_TABLET | Freq: Every day | ORAL | Status: DC

## 2014-01-10 MED ORDER — ROSUVASTATIN CALCIUM 20 MG PO TABS: 20.0000 mg | ORAL_TABLET | Freq: Every evening | ORAL | Status: DC

## 2014-01-10 NOTE — Telephone Encounter (Signed)
Pt need refills onfer Clopidogrel 75 mg #90 and Crestor 20 mg #90.Please fax to [facility]-[phone]. Pt made appt today to see the doctor.Marland Kitchen

## 2014-01-10 NOTE — Telephone Encounter (Signed)
Spoke with patient about making an appointment with a physician, she is a former [place] patient who has not seen anyone since. She said she is currently researching our physicians and will call and make an appointment with the one of her choice within the next couple days. Rx refill was sent into patient pharmacy to keep her on medication until she can get in to see someone.

## 2014-01-12 DIAGNOSIS — L820 Inflamed seborrheic keratosis: Secondary | ICD-10-CM | POA: Diagnosis not present

## 2014-01-12 DIAGNOSIS — L259 Unspecified contact dermatitis, unspecified cause: Secondary | ICD-10-CM | POA: Diagnosis not present

## 2014-01-12 DIAGNOSIS — C44320 Squamous cell carcinoma of skin of unspecified parts of face: Secondary | ICD-10-CM | POA: Diagnosis not present

## 2014-01-19 DIAGNOSIS — Z23 Encounter for immunization: Secondary | ICD-10-CM | POA: Diagnosis not present

## 2014-02-01 DIAGNOSIS — M76899 Other specified enthesopathies of unspecified lower limb, excluding foot: Secondary | ICD-10-CM | POA: Diagnosis not present

## 2014-02-08 DIAGNOSIS — D0430 Carcinoma in situ of skin of unspecified part of face: Secondary | ICD-10-CM | POA: Diagnosis not present

## 2014-02-08 DIAGNOSIS — D0439 Carcinoma in situ of skin of other parts of face: Secondary | ICD-10-CM | POA: Diagnosis not present

## 2014-02-21 DIAGNOSIS — L570 Actinic keratosis: Secondary | ICD-10-CM | POA: Diagnosis not present

## 2014-02-21 DIAGNOSIS — D0430 Carcinoma in situ of skin of unspecified part of face: Secondary | ICD-10-CM | POA: Diagnosis not present

## 2014-02-21 DIAGNOSIS — D0439 Carcinoma in situ of skin of other parts of face: Secondary | ICD-10-CM | POA: Diagnosis not present

## 2014-03-05 DIAGNOSIS — N390 Urinary tract infection, site not specified: Secondary | ICD-10-CM | POA: Diagnosis not present

## 2014-03-05 DIAGNOSIS — E039 Hypothyroidism, unspecified: Secondary | ICD-10-CM | POA: Diagnosis not present

## 2014-03-05 DIAGNOSIS — I2510 Atherosclerotic heart disease of native coronary artery without angina pectoris: Secondary | ICD-10-CM | POA: Diagnosis not present

## 2014-03-05 DIAGNOSIS — K219 Gastro-esophageal reflux disease without esophagitis: Secondary | ICD-10-CM | POA: Diagnosis not present

## 2014-03-05 DIAGNOSIS — E780 Pure hypercholesterolemia: Secondary | ICD-10-CM | POA: Diagnosis not present

## 2014-03-05 DIAGNOSIS — Z8673 Personal history of transient ischemic attack (TIA), and cerebral infarction without residual deficits: Secondary | ICD-10-CM | POA: Diagnosis not present

## 2014-03-05 DIAGNOSIS — M810 Age-related osteoporosis without current pathological fracture: Secondary | ICD-10-CM | POA: Diagnosis not present

## 2014-03-05 DIAGNOSIS — Z0000 Encounter for general adult medical examination without abnormal findings: Secondary | ICD-10-CM | POA: Diagnosis not present

## 2014-03-05 DIAGNOSIS — N3281 Overactive bladder: Secondary | ICD-10-CM | POA: Diagnosis not present

## 2014-03-05 DIAGNOSIS — M25552 Pain in left hip: Secondary | ICD-10-CM | POA: Diagnosis not present

## 2014-03-13 DIAGNOSIS — M7062 Trochanteric bursitis, left hip: Secondary | ICD-10-CM | POA: Diagnosis not present

## 2014-03-22 DIAGNOSIS — M5030 Other cervical disc degeneration, unspecified cervical region: Secondary | ICD-10-CM | POA: Diagnosis not present

## 2014-04-02 ENCOUNTER — Other Ambulatory Visit: Payer: Self-pay | Admitting: Cardiovascular Disease

## 2014-04-03 NOTE — Telephone Encounter (Signed)
Rx was sent to pharmacy electronically. 

## 2014-04-23 ENCOUNTER — Ambulatory Visit
Admission: RE | Admit: 2014-04-23 | Discharge: 2014-04-23 | Disposition: A | Discharge status: Home / Self Care | Payer: Medicare Other | Source: Ambulatory Visit | Attending: Orthopaedic Surgery | Admitting: Orthopaedic Surgery

## 2014-04-23 ENCOUNTER — Other Ambulatory Visit: Payer: Self-pay | Admitting: Orthopaedic Surgery

## 2014-04-23 DIAGNOSIS — I6502 Occlusion and stenosis of left vertebral artery: Secondary | ICD-10-CM | POA: Diagnosis not present

## 2014-04-23 DIAGNOSIS — M542 Cervicalgia: Secondary | ICD-10-CM

## 2014-04-23 DIAGNOSIS — M47813 Spondylosis without myelopathy or radiculopathy, cervicothoracic region: Secondary | ICD-10-CM | POA: Diagnosis not present

## 2014-05-07 DIAGNOSIS — M5030 Other cervical disc degeneration, unspecified cervical region: Secondary | ICD-10-CM | POA: Diagnosis not present

## 2014-05-07 DIAGNOSIS — M542 Cervicalgia: Secondary | ICD-10-CM | POA: Diagnosis not present

## 2014-05-14 DIAGNOSIS — E039 Hypothyroidism, unspecified: Secondary | ICD-10-CM | POA: Diagnosis not present

## 2014-06-06 ENCOUNTER — Ambulatory Visit (INDEPENDENT_AMBULATORY_CARE_PROVIDER_SITE_OTHER): Payer: Medicare Other | Admitting: Cardiovascular Disease

## 2014-06-06 ENCOUNTER — Encounter: Payer: Self-pay | Admitting: Cardiovascular Disease

## 2014-06-06 VITALS — BP 132/82 | HR 70 | Ht 61.0 in | Wt 170.0 lb

## 2014-06-06 DIAGNOSIS — I519 Heart disease, unspecified: Secondary | ICD-10-CM

## 2014-06-06 DIAGNOSIS — I10 Essential (primary) hypertension: Secondary | ICD-10-CM

## 2014-06-06 DIAGNOSIS — I25810 Atherosclerosis of coronary artery bypass graft(s) without angina pectoris: Secondary | ICD-10-CM

## 2014-06-06 DIAGNOSIS — I5189 Other ill-defined heart diseases: Secondary | ICD-10-CM

## 2014-06-06 MED ORDER — MIRABEGRON ER 50 MG PO TB24: 50.0000 mg | ORAL_TABLET | Freq: Two times a day (BID) | ORAL | Status: DC

## 2014-06-06 MED ORDER — LEVOTHYROXINE SODIUM 75 MCG PO TABS: 75.0000 ug | ORAL_TABLET | Freq: Every day | ORAL | Status: DC

## 2014-06-06 MED ORDER — CLOPIDOGREL BISULFATE 75 MG PO TABS: 75.0000 mg | ORAL_TABLET | Freq: Every day | ORAL | Status: AC

## 2014-06-06 MED ORDER — ALENDRONATE SODIUM 70 MG PO TABS: 70.0000 mg | ORAL_TABLET | ORAL | Status: DC

## 2014-06-06 MED ORDER — ROSUVASTATIN CALCIUM 20 MG PO TABS: 20.0000 mg | ORAL_TABLET | Freq: Every evening | ORAL | Status: DC

## 2014-06-06 MED ORDER — PANTOPRAZOLE SODIUM 40 MG PO TBEC: 40.0000 mg | DELAYED_RELEASE_TABLET | Freq: Every day | ORAL | Status: DC

## 2014-06-06 MED ORDER — CLOPIDOGREL BISULFATE 75 MG PO TABS: 75.0000 mg | ORAL_TABLET | Freq: Every day | ORAL | Status: DC

## 2014-06-06 MED ORDER — SERTRALINE HCL 50 MG PO TABS: ORAL_TABLET | ORAL | Status: DC

## 2014-06-06 MED ORDER — ONDANSETRON 4 MG PO TBDP: ORAL_TABLET | ORAL | Status: DC

## 2014-06-06 MED ORDER — CEPHALEXIN 250 MG PO CAPS: 250.0000 mg | ORAL_CAPSULE | Freq: Every day | ORAL | Status: DC

## 2014-06-06 MED ORDER — ONDANSETRON 4 MG PO TBDP
ORAL_TABLET | ORAL | Status: DC
Start: 2014-06-06 — End: 2017-06-12

## 2014-06-06 NOTE — Progress Notes (Signed)
Patient ID: Gina Bowers, female   DOB: 1926-01-26, 81 y.o.   MRN: [id number]     Reason for office visit  establish new cardiac care   Mrs. Jolly is a former patient of Dr. Terance Ice. She has not been seen by cardiology since July 2014. She is now 81 years old and has a rich history of cardiovascular problems. She lives in independent living at [place] [facility]. Her daughter accompanies her to the appointment today.   In 2009 she underwent four-vessel bypass surgery. In 2012 she had chest pain while in [facility] and cardiac catheterization showed occlusion of the SVG to RCA and a stent was placed in the 95% stenosis of the native RCA. There was also 90% stenosis of the left main coronary artery with a patent LIMA to the LAD and a patent SVG to the diagonal. There is no mention of the SVG to OM (OM is described as a moderate size vessel with a 75% proximal stenosis).  A nuclear stress test in 2013 was normal and she has normal left ventricular systolic function by echo as well. Echo in 2013 showed evidence of diastolic dysfunction as well as elevated filling pressures although congestive heart failure has not been a clinically evident problem.  She has a history of ischemic colitis in 2010. Borderline sleep apnea findings, not on therapy.   She had a stroke in the remote past,  Possibly another one in 2013.  MRI has demonstratedchronic total occlusion of the left vertebral artery, moderate tandem stenosis of the right vertebral artery , high-grade stenosis of the proximal basilar artery,  Lesion of the left PICA as well as extensive intracerebral arteriovascular disease in the cerebrum. She has mild short-term memory loss.   She has no cardiovascular complaints but has had frequent falls. Some of these have been fairly serious. On each occasion there is no evidence of dizziness or syncope or symptoms suggestive of a "drop attack. Usually the tip of her foot catches on the carpet  or the edge of the bed and she trips over. Her daughter thinks that since her stroke she has had difficulty keeping the tip of her right foot up when she walks, especially when she is tired.  She is on chronic treatment with aspirin and clopidogrel as well as a statin. She does not require medications for hypertension and does not have diabetes mellitus. She is mildly obese.   Allergies  Allergen Reactions  . Sulfonamide Derivatives Hives    Current Outpatient Prescriptions  Medication Sig Dispense Refill  . alendronate (FOSAMAX) 70 MG tablet Take 1 tablet (70 mg total) by mouth once a week. Take with a full glass of water on an empty stomach. 15 tablet 0  . aspirin EC 81 MG tablet Take 81 mg by mouth daily with breakfast.    . Calcium Carb-Cholecalciferol (CALCIUM 1000 + D PO) Take 2 tablets by mouth daily.    . cephALEXin (KEFLEX) 250 MG capsule Take 1 capsule (250 mg total) by mouth daily. 90 capsule 0  . cephALEXin (KEFLEX) 250 MG capsule Take 1 capsule (250 mg total) by mouth daily. 90 capsule 0  . clopidogrel (PLAVIX) 75 MG tablet Take 1 tablet (75 mg total) by mouth daily. 90 tablet 3  . clopidogrel (PLAVIX) 75 MG tablet Take 1 tablet (75 mg total) by mouth daily. MUST KEEP APPOINTMENT 06/06/2014 FOR FUTURE REFILLS. 90 tablet 0  . CRANBERRY EXTRACT PO Take 500 mg by mouth daily.    Marland Kitchen  fish oil-omega-3 fatty acids 1000 MG capsule Take 1 g by mouth daily with breakfast.    . levothyroxine (SYNTHROID, LEVOTHROID) 75 MCG tablet Take 1 tablet (75 mcg total) by mouth daily before breakfast. 90 tablet 0  . levothyroxine (SYNTHROID, LEVOTHROID) 75 MCG tablet Take 1 tablet (75 mcg total) by mouth daily before breakfast. 90 tablet 0  . meclizine (ANTIVERT) 25 MG tablet Take 25 mg by mouth 3 (three) times daily as needed for dizziness.    . mirabegron ER (MYRBETRIQ) 50 MG TB24 tablet Take 1 tablet (50 mg total) by mouth 2 (two) times daily. 180 tablet 0  . Multiple Vitamin (MULITIVITAMIN WITH  MINERALS) TABS Take 1 tablet by mouth daily with breakfast.    . ondansetron (ZOFRAN ODT) 4 MG disintegrating tablet Take one every 6 hours for nausea 12 tablet 0  . OVER THE COUNTER MEDICATION Take 2 capsules by mouth daily. Immuplex Immune Booster. Takes during the winter months    . pantoprazole (PROTONIX) 40 MG tablet Take 1 tablet (40 mg total) by mouth daily. 90 tablet 3  . rosuvastatin (CRESTOR) 20 MG tablet Take 1 tablet (20 mg total) by mouth every evening. 90 tablet 3  . sertraline (ZOLOFT) 50 MG tablet 1.5 tablets by mouth every day 135 tablet 0   No current facility-administered medications for this visit.    Past Medical History  Diagnosis Date  . Coronary artery disease     DR. Rollene Fare IS PT'S CARDIOLOGIST - HX CABG AND HAS STENT AND TOLD SHE HAS ANOTHER BLOCKAGE-BEING TREATED MEDICALLY  . High cholesterol   . Lung nodules     TOLD SHE HAS INTERSTITUAL LUNG DISEASE  . Shortness of breath     AND WHEEZING AT TIMES--NO INHALERS  . Vertigo   . Arthritis   . GERD (gastroesophageal reflux disease)   . Urinary incontinence   . UTI (lower urinary tract infection)     HX OF UTI'S-- LAST TIME COUPLE OF MONTHS AGO  . Hypothyroidism   . Blood transfusion 2007    AFTER HIP REPLACEMENT  . Fracture FEB 2013    FRACTURED LEFT ANKLE--NO SURGERY--WAS IN BOOT UNTIL COUPLE WEEKS AGO  . Depression   . Carotid bruit     PT'S DAUGHTER STATES PT HAD RECENT CAROTID STUDY AND WAS TOLD NO SIGNIFICANT BLOCKAGES  . [place] virus     COUPLE OF WEEKS AGO--ALL SYMTOMS RESOLVED PER PT  . Kidney stones     IN THE PAST  . Stroke     SEVERAL STROKES -TOLD SHE HAS CEREBELLUM BLOCKAGE AS RESULT OF STROKE--BUT NEUROLOGIST SAID HE DID NOT WANT TO DO ANY PROCEDURE BECAUSE OF HER AGE .  PT HAS SLIGHT LEFT FOOT DROP-DRAGS FOOT WHEN WALKING - USES WALKER  . CAD (coronary artery disease) of artery bypass graft 06/06/2014     2009 CABG 4, Dr. Servando Snare (LIMA to LAD , SVG to diagonal, SVG to circumflex, SVG  to PDA  Cath 2012 in  Recent ill,[place] patent LIMA ,  Patent SVG to diagonal occluded SVG to RCA, report 5 x 18 BMS stent to native RCA    Past Surgical History  Procedure Laterality Date  . Coronary angioplasty  10/2010    WITH STENT PLACEMENT  . Coronary artery bypass graft  2009  . Joint replacement  2007    HIP REPLACEMENT -LEFT  . Bladder tack      1969 AND 1990  . Cystoscopy with injection  09/21/2011    Procedure:  CYSTOSCOPY WITH INJECTION;  Surgeon: Ailene Rud, MD;  Location: [facility];  Service: Urology;  Laterality: N/A;  Peri Urethral Macroplastique Injection and Estring Placement    Family History  Problem Relation Age of Onset  . Heart disease Mother     History   Social History  . Marital Status: Widowed    Spouse Name: N/A    Number of Children: N/A  . Years of Education: N/A   Occupational History  . Not on file.   Social History Main Topics  . Smoking status: Never Smoker   . Smokeless tobacco: Never Used  . Alcohol Use: No  . Drug Use: No  . Sexual Activity: Not on file   Other Topics Concern  . Not on file   Social History Narrative    Review of systems: Falls, unsteady gait with shuffling. The patient specifically denies any chest pain at rest or with exertion, dyspnea at rest or with exertion, orthopnea, paroxysmal nocturnal dyspnea, syncope, palpitations, focal neurological deficits, intermittent claudication, lower extremity edema, unexplained weight gain, cough, hemoptysis or wheezing.  The patient also denies abdominal pain, nausea, vomiting, dysphagia, diarrhea, constipation, polyuria, polydipsia, dysuria, hematuria, frequency, urgency, abnormal bleeding or bruising, fever, chills, unexpected weight changes, mood swings, change in skin or hair texture, change in voice quality, auditory or visual problems, allergic reactions or rashes, new musculoskeletal complaints other than usual "aches and pains".   PHYSICAL EXAM BP 132/82  mmHg  Pulse 70  Ht 5\' 1"  (1.549 m)  Wt 170 lb (77.111 kg)  BMI 32.14 kg/m2  General: Alert, oriented x3, no distress Head: no evidence of trauma, PERRL, EOMI, no exophtalmos or lid lag, no myxedema, no xanthelasma; normal ears, nose and oropharynx Neck: normal jugular venous pulsations and no hepatojugular reflux; brisk carotid pulses without delay and no carotid bruits Chest: clear to auscultation, no signs of consolidation by percussion or palpation, normal fremitus, symmetrical and full respiratory excursions Cardiovascular: normal position and quality of the apical impulse, regular rhythm, normal first and second heart sounds, no murmurs, rubs or gallops Abdomen: no tenderness or distention, no masses by palpation, no abnormal pulsatility or arterial bruits, normal bowel sounds, no hepatosplenomegaly Extremities: no clubbing, cyanosis or edema; 2+ radial, ulnar and brachial pulses bilaterally; 2+ right femoral, posterior tibial and dorsalis pedis pulses; 2+ left femoral, posterior tibial and dorsalis pedis pulses; no subclavian or femoral bruits Neurological: grossly nonfocal   EKG: NSR, normal  Lipid Panel     Component Value Date/Time   CHOL 157 11/13/2011 2357   TRIG 117 11/13/2011 2357   HDL 59 11/13/2011 2357   CHOLHDL 2.7 11/13/2011 2357   VLDL 23 11/13/2011 2357   LDLCALC 75 11/13/2011 2357    BMET    Component Value Date/Time   NA 136 01/24/2013 0639   K 4.2 01/24/2013 0639   CL 99 01/24/2013 0639   CO2 29 01/24/2013 0639   GLUCOSE 103* 01/24/2013 0639   BUN 15 01/24/2013 0639   CREATININE 0.54 01/24/2013 0639   CALCIUM 9.7 01/24/2013 0639   GFRNONAA 84* 01/24/2013 0639   GFRAA >90 01/24/2013 0639     ASSESSMENT AND PLAN  CAD s/p CABG and stent RCA Now completely asymptomatic. Most recent functional study in 2013 was low risk. Focus on risk factor modification.  Preserved left ventricular systolic function. Despite echo diagnosis of diastolic dysfunction  she does not have symptoms of congestive heart failure.   Hyperlipidemia on statin therapy  Her most recent lipid  profile was excellent, but is a little dated. She likely has had more recent tests with Dr. Laurann Montana.   History of ischemic stroke and atherosclerotic cerebrovascular disease  severe but not amenable to revascularization. Continue statin, aspirin and clopidogrel  History of ischemic colitis  Frequent falls  this appears to be a potentially serious problem. Asked her to discuss with her neurologist or physical therapist whether an orthosis might help with her tendency for foot drop, if this indeed is the problem. Safety around the home was reviewed. Orders Placed This Encounter  Procedures  . EKG 12-Lead   Meds ordered this encounter  Medications  . DISCONTD: levothyroxine (SYNTHROID, LEVOTHROID) 75 MCG tablet    Sig: Take 75 mcg by mouth daily before breakfast.  . CRANBERRY EXTRACT PO    Sig: Take 500 mg by mouth daily.  Marland Kitchen DISCONTD: alendronate (FOSAMAX) 70 MG tablet    Sig: Take 70 mg by mouth once a week. Take with a full glass of water on an empty stomach.  . DISCONTD: alendronate (FOSAMAX) 70 MG tablet    Sig: Take 1 tablet (70 mg total) by mouth once a week. Take with a full glass of water on an empty stomach.    Dispense:  15 tablet    Refill:  0  . cephALEXin (KEFLEX) 250 MG capsule    Sig: Take 1 capsule (250 mg total) by mouth daily.    Dispense:  90 capsule    Refill:  0  . clopidogrel (PLAVIX) 75 MG tablet    Sig: Take 1 tablet (75 mg total) by mouth daily.    Dispense:  90 tablet    Refill:  3  . levothyroxine (SYNTHROID, LEVOTHROID) 75 MCG tablet    Sig: Take 1 tablet (75 mcg total) by mouth daily before breakfast.    Dispense:  90 tablet    Refill:  0  . DISCONTD: mirabegron ER (MYRBETRIQ) 50 MG TB24 tablet    Sig: Take 1 tablet (50 mg total) by mouth 2 (two) times daily.    Dispense:  180 tablet    Refill:  0  . DISCONTD: ondansetron (ZOFRAN ODT)  4 MG disintegrating tablet    Sig: Take one every 6 hours for nausea    Dispense:  12 tablet    Refill:  0  . DISCONTD: pantoprazole (PROTONIX) 40 MG tablet    Sig: Take 1 tablet (40 mg total) by mouth daily.    Dispense:  90 tablet    Refill:  3  . DISCONTD: rosuvastatin (CRESTOR) 20 MG tablet    Sig: Take 1 tablet (20 mg total) by mouth every evening. MUST KEEP APPOINTMENT 06/06/2014 FOR FUTURE REFILLS.    Dispense:  90 tablet    Refill:  0  . DISCONTD: sertraline (ZOLOFT) 50 MG tablet    Sig: 1.5 tablets by mouth every day    Dispense:  135 tablet    Refill:  0  . alendronate (FOSAMAX) 70 MG tablet    Sig: Take 1 tablet (70 mg total) by mouth once a week. Take with a full glass of water on an empty stomach.    Dispense:  15 tablet    Refill:  0  . clopidogrel (PLAVIX) 75 MG tablet    Sig: Take 1 tablet (75 mg total) by mouth daily. MUST KEEP APPOINTMENT 06/06/2014 FOR FUTURE REFILLS.    Dispense:  90 tablet    Refill:  0  . cephALEXin (KEFLEX) 250 MG capsule  Sig: Take 1 capsule (250 mg total) by mouth daily.    Dispense:  90 capsule    Refill:  0  . levothyroxine (SYNTHROID, LEVOTHROID) 75 MCG tablet    Sig: Take 1 tablet (75 mcg total) by mouth daily before breakfast.    Dispense:  90 tablet    Refill:  0  . mirabegron ER (MYRBETRIQ) 50 MG TB24 tablet    Sig: Take 1 tablet (50 mg total) by mouth 2 (two) times daily.    Dispense:  180 tablet    Refill:  0  . ondansetron (ZOFRAN ODT) 4 MG disintegrating tablet    Sig: Take one every 6 hours for nausea    Dispense:  12 tablet    Refill:  0  . pantoprazole (PROTONIX) 40 MG tablet    Sig: Take 1 tablet (40 mg total) by mouth daily.    Dispense:  90 tablet    Refill:  3  . rosuvastatin (CRESTOR) 20 MG tablet    Sig: Take 1 tablet (20 mg total) by mouth every evening.    Dispense:  90 tablet    Refill:  3  . sertraline (ZOLOFT) 50 MG tablet    Sig: 1.5 tablets by mouth every day    Dispense:  135 tablet    Refill:  0      Horrace Hanak  Sanda Klein, MD, [facility] [phone] office [phone] pager

## 2014-06-06 NOTE — Patient Instructions (Signed)
Your physician wants you to follow-up in: 1 year with Dr. Croitoru. You will receive a reminder letter in the mail two months in advance. If you don't receive a letter, please call our office to schedule the follow-up appointment.  

## 2014-06-14 ENCOUNTER — Telehealth: Payer: Self-pay | Admitting: *Deleted

## 2014-06-14 NOTE — Telephone Encounter (Signed)
PA received from [facility] for Myrbetriq because it has been prescribed twice daily and states maximum dose is once daily.  Our office refilled x 1 for convenience to the patient since she was changing mail order.  Notified daughter this PA has been faxed to prescribing MD. Dr. Gaynelle Arabian.  Voiced understanding.

## 2014-08-02 ENCOUNTER — Encounter: Payer: Self-pay | Admitting: Podiatry

## 2014-08-02 ENCOUNTER — Ambulatory Visit (INDEPENDENT_AMBULATORY_CARE_PROVIDER_SITE_OTHER): Payer: Medicare Other | Admitting: Podiatry

## 2014-08-02 VITALS — BP 172/91 | HR 69 | Resp 16

## 2014-08-02 DIAGNOSIS — L84 Corns and callosities: Secondary | ICD-10-CM

## 2014-08-03 NOTE — Progress Notes (Signed)
Subjective:     Patient ID: Gina Bowers, female   DOB: 05/13/1926, 81 y.o.   MRN: [id number]  HPI patient presents with painful callus condition plantar left   Review of Systems     Objective:   Physical Exam Neurovascular status intact with keratotic lesion sub-left hallux that's painful when pressed    Assessment:     Lesion secondary to pressure    Plan:     Debride lesion with no iatrogenic bleeding noted and reappoint as needed

## 2014-10-16 ENCOUNTER — Other Ambulatory Visit: Payer: Self-pay | Admitting: Cardiovascular Disease

## 2014-10-16 NOTE — Telephone Encounter (Signed)
Rx(s) sent to pharmacy electronically.  

## 2014-12-14 ENCOUNTER — Telehealth: Payer: Self-pay | Admitting: Cardiovascular Disease

## 2014-12-14 MED ORDER — ROSUVASTATIN CALCIUM 20 MG PO TABS: 20.0000 mg | ORAL_TABLET | Freq: Every evening | ORAL | Status: DC

## 2014-12-14 NOTE — Telephone Encounter (Signed)
Notified patient's daughter no samples are available and Rx was sent to pharmacy for 90 day supply

## 2014-12-14 NOTE — Telephone Encounter (Signed)
Pt would like some samples of Crestor please. If you do not have samples,please call a prescription in for generic Crestor. Please call this to [facility]-[phone].

## 2015-02-19 DIAGNOSIS — Z23 Encounter for immunization: Secondary | ICD-10-CM | POA: Diagnosis not present

## 2015-03-14 DIAGNOSIS — Z1389 Encounter for screening for other disorder: Secondary | ICD-10-CM | POA: Diagnosis not present

## 2015-03-14 DIAGNOSIS — I10 Essential (primary) hypertension: Secondary | ICD-10-CM | POA: Diagnosis not present

## 2015-03-14 DIAGNOSIS — N3281 Overactive bladder: Secondary | ICD-10-CM | POA: Diagnosis not present

## 2015-03-14 DIAGNOSIS — K219 Gastro-esophageal reflux disease without esophagitis: Secondary | ICD-10-CM | POA: Diagnosis not present

## 2015-03-14 DIAGNOSIS — M810 Age-related osteoporosis without current pathological fracture: Secondary | ICD-10-CM | POA: Diagnosis not present

## 2015-03-14 DIAGNOSIS — E7800 Pure hypercholesterolemia, unspecified: Secondary | ICD-10-CM | POA: Diagnosis not present

## 2015-03-14 DIAGNOSIS — F325 Major depressive disorder, single episode, in full remission: Secondary | ICD-10-CM | POA: Diagnosis not present

## 2015-03-14 DIAGNOSIS — Z79899 Other long term (current) drug therapy: Secondary | ICD-10-CM | POA: Diagnosis not present

## 2015-03-14 DIAGNOSIS — E039 Hypothyroidism, unspecified: Secondary | ICD-10-CM | POA: Diagnosis not present

## 2015-03-14 DIAGNOSIS — R8290 Unspecified abnormal findings in urine: Secondary | ICD-10-CM | POA: Diagnosis not present

## 2015-04-11 DIAGNOSIS — M859 Disorder of bone density and structure, unspecified: Secondary | ICD-10-CM | POA: Diagnosis not present

## 2015-04-11 DIAGNOSIS — M8589 Other specified disorders of bone density and structure, multiple sites: Secondary | ICD-10-CM | POA: Diagnosis not present

## 2015-04-22 DIAGNOSIS — H4720 Unspecified optic atrophy: Secondary | ICD-10-CM | POA: Diagnosis not present

## 2015-05-01 DIAGNOSIS — H52223 Regular astigmatism, bilateral: Secondary | ICD-10-CM | POA: Diagnosis not present

## 2015-05-01 DIAGNOSIS — H5203 Hypermetropia, bilateral: Secondary | ICD-10-CM | POA: Diagnosis not present

## 2015-05-01 DIAGNOSIS — Z961 Presence of intraocular lens: Secondary | ICD-10-CM | POA: Diagnosis not present

## 2015-05-01 DIAGNOSIS — H524 Presbyopia: Secondary | ICD-10-CM | POA: Diagnosis not present

## 2015-05-01 DIAGNOSIS — H04129 Dry eye syndrome of unspecified lacrimal gland: Secondary | ICD-10-CM | POA: Diagnosis not present

## 2015-05-25 ENCOUNTER — Emergency Department (HOSPITAL_BASED_OUTPATIENT_CLINIC_OR_DEPARTMENT_OTHER): Payer: Medicare Other

## 2015-05-25 ENCOUNTER — Encounter (HOSPITAL_BASED_OUTPATIENT_CLINIC_OR_DEPARTMENT_OTHER): Payer: Self-pay | Admitting: *Deleted

## 2015-05-25 ENCOUNTER — Emergency Department (HOSPITAL_BASED_OUTPATIENT_CLINIC_OR_DEPARTMENT_OTHER)
Admission: EM | Admit: 2015-05-25 | Discharge: 2015-05-25 | Disposition: A | Discharge status: Home / Self Care | Payer: Medicare Other | Attending: Emergency Medicine | Admitting: Emergency Medicine

## 2015-05-25 DIAGNOSIS — R0981 Nasal congestion: Secondary | ICD-10-CM | POA: Insufficient documentation

## 2015-05-25 DIAGNOSIS — F329 Major depressive disorder, single episode, unspecified: Secondary | ICD-10-CM | POA: Diagnosis not present

## 2015-05-25 DIAGNOSIS — Z7982 Long term (current) use of aspirin: Secondary | ICD-10-CM | POA: Insufficient documentation

## 2015-05-25 DIAGNOSIS — R067 Sneezing: Secondary | ICD-10-CM | POA: Diagnosis not present

## 2015-05-25 DIAGNOSIS — Z87440 Personal history of urinary (tract) infections: Secondary | ICD-10-CM | POA: Insufficient documentation

## 2015-05-25 DIAGNOSIS — Z8781 Personal history of (healed) traumatic fracture: Secondary | ICD-10-CM | POA: Diagnosis not present

## 2015-05-25 DIAGNOSIS — E7800 Pure hypercholesterolemia, unspecified: Secondary | ICD-10-CM | POA: Insufficient documentation

## 2015-05-25 DIAGNOSIS — E039 Hypothyroidism, unspecified: Secondary | ICD-10-CM | POA: Diagnosis not present

## 2015-05-25 DIAGNOSIS — K219 Gastro-esophageal reflux disease without esophagitis: Secondary | ICD-10-CM | POA: Diagnosis not present

## 2015-05-25 DIAGNOSIS — R51 Headache: Secondary | ICD-10-CM | POA: Diagnosis not present

## 2015-05-25 DIAGNOSIS — R05 Cough: Secondary | ICD-10-CM | POA: Diagnosis not present

## 2015-05-25 DIAGNOSIS — I2510 Atherosclerotic heart disease of native coronary artery without angina pectoris: Secondary | ICD-10-CM | POA: Insufficient documentation

## 2015-05-25 DIAGNOSIS — Z951 Presence of aortocoronary bypass graft: Secondary | ICD-10-CM | POA: Insufficient documentation

## 2015-05-25 DIAGNOSIS — Z8669 Personal history of other diseases of the nervous system and sense organs: Secondary | ICD-10-CM | POA: Insufficient documentation

## 2015-05-25 DIAGNOSIS — Z79899 Other long term (current) drug therapy: Secondary | ICD-10-CM | POA: Insufficient documentation

## 2015-05-25 DIAGNOSIS — R6889 Other general symptoms and signs: Secondary | ICD-10-CM

## 2015-05-25 DIAGNOSIS — M1990 Unspecified osteoarthritis, unspecified site: Secondary | ICD-10-CM | POA: Insufficient documentation

## 2015-05-25 DIAGNOSIS — Z8673 Personal history of transient ischemic attack (TIA), and cerebral infarction without residual deficits: Secondary | ICD-10-CM | POA: Diagnosis not present

## 2015-05-25 DIAGNOSIS — R509 Fever, unspecified: Secondary | ICD-10-CM | POA: Insufficient documentation

## 2015-05-25 DIAGNOSIS — Z87442 Personal history of urinary calculi: Secondary | ICD-10-CM | POA: Insufficient documentation

## 2015-05-25 DIAGNOSIS — Z9861 Coronary angioplasty status: Secondary | ICD-10-CM | POA: Insufficient documentation

## 2015-05-25 DIAGNOSIS — R062 Wheezing: Secondary | ICD-10-CM | POA: Diagnosis not present

## 2015-05-25 DIAGNOSIS — Z792 Long term (current) use of antibiotics: Secondary | ICD-10-CM | POA: Insufficient documentation

## 2015-05-25 DIAGNOSIS — J111 Influenza due to unidentified influenza virus with other respiratory manifestations: Secondary | ICD-10-CM | POA: Diagnosis not present

## 2015-05-25 LAB — URINALYSIS, ROUTINE W REFLEX MICROSCOPIC
BILIRUBIN URINE: NEGATIVE
Glucose, UA: NEGATIVE mg/dL
Hgb urine dipstick: NEGATIVE
KETONES UR: NEGATIVE mg/dL
Leukocytes, UA: NEGATIVE
NITRITE: NEGATIVE
PH: 6 (ref 5.0–8.0)
Protein, ur: NEGATIVE mg/dL
Specific Gravity, Urine: 1.018 (ref 1.005–1.030)

## 2015-05-25 LAB — INFLUENZA PANEL BY PCR (TYPE A & B)
H1N1 flu by pcr: NOT DETECTED
INFLAPCR: NEGATIVE
INFLBPCR: NEGATIVE

## 2015-05-25 MED ORDER — OSELTAMIVIR PHOSPHATE 75 MG PO CAPS: 75.0000 mg | ORAL_CAPSULE | Freq: Two times a day (BID) | ORAL | Status: DC

## 2015-05-25 NOTE — ED Provider Notes (Signed)
CSN: [id number]     Arrival date & time 05/25/15  1113 History   First MD Initiated Contact with Patient 05/25/15 1141     Chief Complaint  Patient presents with  . URI     (Consider location/radiation/quality/duration/timing/severity/associated sxs/prior Treatment) HPI  81 year old female presents with cough since yesterday. Had some sneezing the day before but then developed a dry cough yesterday and a low-grade fever. Has been having subjective fever and chills since last night. Also having nasal congestion, mild headache. Felt like she had chest congestion earlier as well. Daughter heard patient wheezing last night. No shortness of breath or chest pain. No body aches. No sore throat or trouble swallowing. Call their PCP who recommended they come to the ER for evaluation given her past medical history. Son in law at home has similar symptoms.  Past Medical History  Diagnosis Date  . Coronary artery disease     DR. Rollene Fare IS PT'S CARDIOLOGIST - HX CABG AND HAS STENT AND TOLD SHE HAS ANOTHER BLOCKAGE-BEING TREATED MEDICALLY  . High cholesterol   . Lung nodules     TOLD SHE HAS INTERSTITUAL LUNG DISEASE  . Shortness of breath     AND WHEEZING AT TIMES--NO INHALERS  . Vertigo   . Arthritis   . GERD (gastroesophageal reflux disease)   . Urinary incontinence   . UTI (lower urinary tract infection)     HX OF UTI'S-- LAST TIME COUPLE OF MONTHS AGO  . Hypothyroidism   . Blood transfusion 2007    AFTER HIP REPLACEMENT  . Fracture FEB 2013    FRACTURED LEFT ANKLE--NO SURGERY--WAS IN BOOT UNTIL COUPLE WEEKS AGO  . Depression   . Carotid bruit     PT'S DAUGHTER STATES PT HAD RECENT CAROTID STUDY AND WAS TOLD NO SIGNIFICANT BLOCKAGES  . Norwalk virus     COUPLE OF WEEKS AGO--ALL SYMTOMS RESOLVED PER PT  . Kidney stones     IN THE PAST  . Stroke ([place])     SEVERAL STROKES -TOLD SHE HAS CEREBELLUM BLOCKAGE AS RESULT OF STROKE--BUT NEUROLOGIST SAID HE DID NOT WANT TO DO ANY PROCEDURE  BECAUSE OF HER AGE .  PT HAS SLIGHT LEFT FOOT DROP-DRAGS FOOT WHEN WALKING - USES WALKER  . CAD (coronary artery disease) of artery bypass graft 06/06/2014     2009 CABG 4, Dr. Servando Snare (LIMA to LAD , SVG to diagonal, SVG to circumflex, SVG to PDA  Cath 2012 in  Recent ill,[place] patent LIMA ,  Patent SVG to diagonal occluded SVG to RCA, report 5 x 18 BMS stent to native RCA  . Ischemic colitis ([place]) 2010  . Obstructive sleep apnea 2010   Past Surgical History  Procedure Laterality Date  . Coronary angioplasty  10/2010    WITH STENT PLACEMENT  . Coronary artery bypass graft  2009  . Joint replacement  2007    HIP REPLACEMENT -LEFT  . Bladder tack      1969 AND 1990  . Cystoscopy with injection  09/21/2011    Procedure: CYSTOSCOPY WITH INJECTION;  Surgeon: Ailene Rud, MD;  Location: [facility];  Service: Urology;  Laterality: N/A;  Peri Urethral Macroplastique Injection and Estring Placement   Family History  Problem Relation Age of Onset  . Heart disease Mother    Social History  Substance Use Topics  . Smoking status: Never Smoker   . Smokeless tobacco: Never Used  . Alcohol Use: No   OB History    No data  available     Review of Systems  Constitutional: Positive for fever.  HENT: Positive for congestion and sneezing. Negative for ear pain and sore throat.   Respiratory: Positive for cough and wheezing. Negative for shortness of breath.   Gastrointestinal: Negative for vomiting.  Neurological: Positive for headaches.  All other systems reviewed and are negative.     Allergies  Sulfonamide derivatives  Home Medications   Prior to Admission medications   Medication Sig Start Date End Date Taking? Authorizing Provider  alendronate (FOSAMAX) 70 MG tablet Take 1 tablet (70 mg total) by mouth once a week. Take with a full glass of water on an empty stomach. 06/06/14   Mihai Croitoru, MD  aspirin EC 81 MG tablet Take 81 mg by mouth daily with breakfast.     Historical Provider, MD  Calcium Carb-Cholecalciferol (CALCIUM 1000 + D PO) Take 2 tablets by mouth daily.    Historical Provider, MD  cephALEXin (KEFLEX) 250 MG capsule Take 1 capsule (250 mg total) by mouth daily. 06/06/14   Mihai Croitoru, MD  cephALEXin (KEFLEX) 250 MG capsule Take 1 capsule (250 mg total) by mouth daily. 06/06/14   Mihai Croitoru, MD  clopidogrel (PLAVIX) 75 MG tablet Take 1 tablet (75 mg total) by mouth daily. 06/06/14   Mihai Croitoru, MD  clopidogrel (PLAVIX) 75 MG tablet Take 1 tablet (75 mg total) by mouth once. 10/16/14   Mihai Croitoru, MD  CRANBERRY EXTRACT PO Take 500 mg by mouth daily.    Historical Provider, MD  fish oil-omega-3 fatty acids 1000 MG capsule Take 1 g by mouth daily with breakfast.    Historical Provider, MD  levothyroxine (SYNTHROID, LEVOTHROID) 75 MCG tablet Take 1 tablet (75 mcg total) by mouth daily before breakfast. 06/06/14   Mihai Croitoru, MD  levothyroxine (SYNTHROID, LEVOTHROID) 75 MCG tablet Take 1 tablet (75 mcg total) by mouth daily before breakfast. 06/06/14   Mihai Croitoru, MD  meclizine (ANTIVERT) 25 MG tablet Take 25 mg by mouth 3 (three) times daily as needed for dizziness.    Historical Provider, MD  mirabegron ER (MYRBETRIQ) 50 MG TB24 tablet Take 1 tablet (50 mg total) by mouth 2 (two) times daily. 06/06/14   Mihai Croitoru, MD  Multiple Vitamin (MULITIVITAMIN WITH MINERALS) TABS Take 1 tablet by mouth daily with breakfast.    Historical Provider, MD  ondansetron (ZOFRAN ODT) 4 MG disintegrating tablet Take one every 6 hours for nausea 06/06/14   Dani Gobble Croitoru, MD  OVER THE COUNTER MEDICATION Take 2 capsules by mouth daily. Immuplex Immune Booster. Takes during the winter months    Historical Provider, MD  pantoprazole (PROTONIX) 40 MG tablet Take 1 tablet (40 mg total) by mouth daily. 06/06/14   Mihai Croitoru, MD  rosuvastatin (CRESTOR) 20 MG tablet Take 1 tablet (20 mg total) by mouth every evening. 12/14/14   Sanda Klein, MD   sertraline (ZOLOFT) 50 MG tablet 1.5 tablets by mouth every day 06/06/14   Mihai Croitoru, MD   BP 166/82 mmHg  Pulse 69  Temp(Src) 97.6 F (36.4 C) (Oral)  Resp 18  Ht 5\' 1"  (1.549 m)  Wt 170 lb (77.111 kg)  BMI 32.14 kg/m2  SpO2 93% Physical Exam  Constitutional: She is oriented to person, place, and time. She appears well-developed and well-nourished.  HENT:  Head: Normocephalic and atraumatic.  Right Ear: External ear normal.  Left Ear: External ear normal.  Nose: Nose normal.  Mouth/Throat: Oropharynx is clear and moist. No oropharyngeal exudate.  Eyes: Right eye exhibits no discharge. Left eye exhibits no discharge.  Cardiovascular: Normal rate, regular rhythm and normal heart sounds.   Pulmonary/Chest: Effort normal and breath sounds normal. She has no wheezes.  Abdominal: Soft. There is no tenderness.  Neurological: She is alert and oriented to person, place, and time.  Skin: Skin is warm and dry.  Nursing note and vitals reviewed.   ED Course  Procedures (including critical care time) Labs Review Labs Reviewed  URINALYSIS, ROUTINE W REFLEX MICROSCOPIC (NOT AT [facility])  INFLUENZA PANEL BY PCR (TYPE A & B, H1N1)    Imaging Review Dg Chest 2 View  05/25/2015  CLINICAL DATA:  Initial encounter for cough congestion and fever for 2 days. EXAM: CHEST  2 VIEW COMPARISON:  12/07/2012.  11/13/2011. FINDINGS: The lungs are clear wiithout focal pneumonia, edema, pneumothorax or pleural effusion. Tiny nodule over right lower lung is stable comparing back to previous studies and may represent a granuloma. Interstitial markings are diffusely coarsened with chronic features. Small nodular density in the left parahilar region may be related to the anterior first left rib, but is not visualized on previous exams. The cardiopericardial silhouette is within normal limits for size. Patient is status post CABG. Bones are diffusely demineralized. IMPRESSION: Hyperexpansion with chronic  underlying interstitial disease. Nodular density in the left parahilar region may be related to the first left anterior rib, but is not visible on previous imaging. CT chest recommended without contrast to exclude underlying pulmonary parenchymal nodule. Electronically Signed   By: Misty Stanley M.D.   On: 05/25/2015 12:45   I have personally reviewed and evaluated these images and lab results as part of my medical decision-making.   EKG Interpretation None      MDM   Final diagnoses:  Flu-like symptoms    Patient with flulike symptoms for the last 24 hours. Appears well here, vital signs are unremarkable, no hypoxia or increased work of breathing. X-ray shows no pneumonia. I did tell the patient about the pulmonary nodule seen on x-ray and that she will need an outpatient CT scan. Son-in-law's at home with similar symptoms and is now complaining of severe myalgias as well, he likely has the flu and thus after discussion with patient we'll treat her with Tamiflu given the onset of symptoms and her comorbidities. Discussed return precautions and recommend close follow-up with PCP.    Sherwood Gambler, MD 05/25/15 [phone]

## 2015-05-25 NOTE — ED Notes (Signed)
C/O generalized weakness, chills at night/cough

## 2015-05-25 NOTE — ED Notes (Signed)
Nasal congestion, fever and cough x 2 days.

## 2015-06-03 ENCOUNTER — Other Ambulatory Visit: Payer: Self-pay | Admitting: Cardiovascular Disease

## 2015-06-04 ENCOUNTER — Other Ambulatory Visit: Payer: Self-pay

## 2015-06-04 ENCOUNTER — Encounter: Payer: Self-pay | Admitting: Internal Medicine

## 2015-06-04 MED ORDER — PANTOPRAZOLE SODIUM 40 MG PO TBEC: 40.0000 mg | DELAYED_RELEASE_TABLET | Freq: Every day | ORAL | Status: DC

## 2015-06-17 ENCOUNTER — Ambulatory Visit: Payer: Medicare Other | Admitting: Cardiovascular Disease

## 2015-06-25 ENCOUNTER — Other Ambulatory Visit: Payer: Self-pay | Admitting: Cardiovascular Disease

## 2015-07-24 ENCOUNTER — Encounter: Payer: Self-pay | Admitting: Cardiovascular Disease

## 2015-07-24 ENCOUNTER — Ambulatory Visit (INDEPENDENT_AMBULATORY_CARE_PROVIDER_SITE_OTHER): Payer: Medicare Other | Admitting: Cardiovascular Disease

## 2015-07-24 VITALS — BP 160/90 | HR 71 | Ht 60.0 in | Wt 170.0 lb

## 2015-07-24 DIAGNOSIS — I651 Occlusion and stenosis of basilar artery: Secondary | ICD-10-CM

## 2015-07-24 DIAGNOSIS — I519 Heart disease, unspecified: Secondary | ICD-10-CM | POA: Diagnosis not present

## 2015-07-24 DIAGNOSIS — I5189 Other ill-defined heart diseases: Secondary | ICD-10-CM

## 2015-07-24 DIAGNOSIS — R351 Nocturia: Secondary | ICD-10-CM | POA: Diagnosis not present

## 2015-07-24 DIAGNOSIS — I150 Renovascular hypertension: Secondary | ICD-10-CM | POA: Diagnosis not present

## 2015-07-24 DIAGNOSIS — E785 Hyperlipidemia, unspecified: Secondary | ICD-10-CM

## 2015-07-24 DIAGNOSIS — I701 Atherosclerosis of renal artery: Secondary | ICD-10-CM

## 2015-07-24 DIAGNOSIS — I25810 Atherosclerosis of coronary artery bypass graft(s) without angina pectoris: Secondary | ICD-10-CM | POA: Diagnosis not present

## 2015-07-24 DIAGNOSIS — Z8673 Personal history of transient ischemic attack (TIA), and cerebral infarction without residual deficits: Secondary | ICD-10-CM

## 2015-07-24 DIAGNOSIS — I6509 Occlusion and stenosis of unspecified vertebral artery: Secondary | ICD-10-CM

## 2015-07-24 DIAGNOSIS — Z0000 Encounter for general adult medical examination without abnormal findings: Secondary | ICD-10-CM | POA: Diagnosis not present

## 2015-07-24 MED ORDER — LISINOPRIL 5 MG PO TABS: 5.0000 mg | ORAL_TABLET | Freq: Every day | ORAL | Status: DC

## 2015-07-24 NOTE — Progress Notes (Signed)
Patient ID: Gina Bowers, female   DOB: December 26, 1925, 82 y.o.   MRN: [id number]    Cardiology Office Note    Date:  07/25/2015   ID:  Gina Bowers, DOB 01-19-1926, MRN [id number]  PCP:  Gina Shelling, MD  Cardiologist:   Sanda Klein, MD   Chief Complaint  Patient presents with  . Appointment    some shob. some tiredness and fatigue. no other complaints.     History of Present Illness:  Gina Bowers is a 82 y.o. female an extensive history of coronary and peripheral arterial disease who presents for follow-up describing some recent exertional dyspnea and increase in blood pressure. When she last saw her primary care physician her blood pressure was about 160/90 and a similar values recorded today. In fact when I rechecked her blood pressure was 169/91. She denies angina pectoris and has not had any recent falls or new complaints of focal neurological deficits. She continues to take aspirin and Plavix and high-dose statin.  In 2009 she underwent four-vessel bypass surgery. In 2012 she had chest pain while in [facility] and cardiac catheterization showed occlusion of the SVG to RCA and a stent was placed in the 95% stenosis of the native RCA. There was also 90% stenosis of the left main coronary artery with a patent LIMA to the LAD and a patent SVG to the diagonal. There is no mention of the SVG to OM (OM is described as a moderate size vessel with a 75% proximal stenosis). A nuclear stress test in 2013 was normal and she has normal left ventricular systolic function by echo as well. Echo in 2013 showed evidence of diastolic dysfunction as well as elevated filling pressures although congestive heart failure has not been a clinically evident problem. She has a history of ischemic colitis in 2010. She had a stroke in the remote past, Possibly another one in 2013. MRI has demonstrated chronic total occlusion of the left vertebral artery, moderate tandem stenosis of the right  vertebral artery , high-grade stenosis of the proximal basilar artery, Lesion of the left PICA as well as extensive intracerebral arteriovascular disease in the cerebrum. She has mild short-term memory loss.   Past Medical History  Diagnosis Date  . Coronary artery disease     DR. Rollene Fare IS PT'S CARDIOLOGIST - HX CABG AND HAS STENT AND TOLD SHE HAS ANOTHER BLOCKAGE-BEING TREATED MEDICALLY  . High cholesterol   . Lung nodules     TOLD SHE HAS INTERSTITUAL LUNG DISEASE  . Shortness of breath     AND WHEEZING AT TIMES--NO INHALERS  . Vertigo   . Arthritis   . GERD (gastroesophageal reflux disease)   . Urinary incontinence   . UTI (lower urinary tract infection)     HX OF UTI'S-- LAST TIME COUPLE OF MONTHS AGO  . Hypothyroidism   . Blood transfusion 2007    AFTER HIP REPLACEMENT  . Fracture FEB 2013    FRACTURED LEFT ANKLE--NO SURGERY--WAS IN BOOT UNTIL COUPLE WEEKS AGO  . Depression   . Carotid bruit     PT'S DAUGHTER STATES PT HAD RECENT CAROTID STUDY AND WAS TOLD NO SIGNIFICANT BLOCKAGES  . Norwalk virus     COUPLE OF WEEKS AGO--ALL SYMTOMS RESOLVED PER PT  . Kidney stones     IN THE PAST  . Stroke ([place])     SEVERAL STROKES -TOLD SHE HAS CEREBELLUM BLOCKAGE AS RESULT OF STROKE--BUT NEUROLOGIST SAID HE DID NOT WANT TO DO ANY PROCEDURE  BECAUSE OF HER AGE .  PT HAS SLIGHT LEFT FOOT DROP-DRAGS FOOT WHEN WALKING - USES WALKER  . CAD (coronary artery disease) of artery bypass graft 06/06/2014     2009 CABG 4, Dr. Servando Snare (LIMA to LAD , SVG to diagonal, SVG to circumflex, SVG to PDA  Cath 2012 in  Recent ill,[place] patent LIMA ,  Patent SVG to diagonal occluded SVG to RCA, report 5 x 18 BMS stent to native RCA  . Ischemic colitis ([place]) 2010  . Obstructive sleep apnea 2010    Past Surgical History  Procedure Laterality Date  . Coronary angioplasty  10/2010    WITH STENT PLACEMENT  . Coronary artery bypass graft  2009  . Joint replacement  2007    HIP REPLACEMENT -LEFT  .  Bladder tack      1969 AND 1990  . Cystoscopy with injection  09/21/2011    Procedure: CYSTOSCOPY WITH INJECTION;  Surgeon: Ailene Rud, MD;  Location: [facility];  Service: Urology;  Laterality: N/A;  Peri Urethral Macroplastique Injection and Estring Placement    Outpatient Prescriptions Prior to Visit  Medication Sig Dispense Refill  . alendronate (FOSAMAX) 70 MG tablet Take 1 tablet (70 mg total) by mouth once a week. Take with a full glass of water on an empty stomach. 15 tablet 0  . aspirin EC 81 MG tablet Take 81 mg by mouth daily with breakfast.    . Calcium Carb-Cholecalciferol (CALCIUM 1000 + D PO) Take 2 tablets by mouth daily.    . cephALEXin (KEFLEX) 250 MG capsule Take 1 capsule (250 mg total) by mouth daily. 90 capsule 0  . clopidogrel (PLAVIX) 75 MG tablet Take 1 tablet (75 mg total) by mouth daily. 90 tablet 3  . CRANBERRY EXTRACT PO Take 500 mg by mouth daily.    . fish oil-omega-3 fatty acids 1000 MG capsule Take 1 g by mouth daily with breakfast.    . levothyroxine (SYNTHROID, LEVOTHROID) 75 MCG tablet Take 1 tablet (75 mcg total) by mouth daily before breakfast. 90 tablet 0  . meclizine (ANTIVERT) 25 MG tablet Take 25 mg by mouth 3 (three) times daily as needed for dizziness.    . mirabegron ER (MYRBETRIQ) 50 MG TB24 tablet Take 1 tablet (50 mg total) by mouth 2 (two) times daily. 180 tablet 0  . Multiple Vitamin (MULITIVITAMIN WITH MINERALS) TABS Take 1 tablet by mouth daily with breakfast.    . ondansetron (ZOFRAN ODT) 4 MG disintegrating tablet Take one every 6 hours for nausea 12 tablet 0  . OVER THE COUNTER MEDICATION Take 2 capsules by mouth daily. Immuplex Immune Booster. Takes during the winter months    . pantoprazole (PROTONIX) 40 MG tablet Take 1 tablet (40 mg total) by mouth daily. 90 tablet 3  . rosuvastatin (CRESTOR) 20 MG tablet TAKE 1 TABLET (20 MG TOTAL) BY MOUTH EVERY EVENING. 90 tablet 0  . sertraline (ZOLOFT) 50 MG tablet 1.5 tablets by mouth every  day 135 tablet 0  . cephALEXin (KEFLEX) 250 MG capsule Take 1 capsule (250 mg total) by mouth daily. (Patient not taking: Reported on 07/24/2015) 90 capsule 0  . clopidogrel (PLAVIX) 75 MG tablet Take 1 tablet (75 mg total) by mouth once. (Patient not taking: Reported on 07/24/2015) 90 tablet 2  . levothyroxine (SYNTHROID, LEVOTHROID) 75 MCG tablet Take 1 tablet (75 mcg total) by mouth daily before breakfast. (Patient not taking: Reported on 07/24/2015) 90 tablet 0  . oseltamivir (TAMIFLU) 75 MG  capsule Take 1 capsule (75 mg total) by mouth every 12 (twelve) hours. (Patient not taking: Reported on 07/24/2015) 10 capsule 0   No facility-administered medications prior to visit.     Allergies:   Sulfonamide derivatives   Social History   Social History  . Marital Status: Widowed    Spouse Name: N/A  . Number of Children: N/A  . Years of Education: N/A   Social History Main Topics  . Smoking status: Never Smoker   . Smokeless tobacco: Never Used  . Alcohol Use: No  . Drug Use: No  . Sexual Activity: Not Asked   Other Topics Concern  . None   Social History Narrative     Family History:  The patient's family history includes Heart disease in her mother.   ROS:   Please see the history of present illness.    ROS All other systems reviewed and are negative.   PHYSICAL EXAM:   VS:  BP 160/90 mmHg  Pulse 71  Ht 5' (1.524 m)  Wt 77.111 kg (170 lb)  BMI 33.20 kg/m2   GEN: Well nourished, well developed, in no acute distress HEENT: normal Neck: no JVD, bilateral carotid bruits, louder on the right, no masses Cardiac: RRR; no murmurs, rubs, or gallops,no edema , sternotomy scar, normal radial, ulnar and pedal pulses bilaterally, equal blood pressure in the right and left upper extremities Respiratory:  clear to auscultation bilaterally, normal work of breathing GI: soft, nontender, nondistended, + BS MS: no deformity or atrophy Skin: warm and dry, no rash Neuro:  Alert and Oriented x  3, Strength and sensation are intact Psych: euthymic mood, full affect  Wt Readings from Last 3 Encounters:  07/24/15 77.111 kg (170 lb)  05/25/15 77.111 kg (170 lb)  06/06/14 77.111 kg (170 lb)      Studies/Labs Reviewed:   EKG:  EKG is ordered today.  The ekg ordered today demonstrates normal sinus rhythm, normal tracing, QTC 447 ms  Recent Labs: No results found for requested labs within last 365 days.   Lipid Panel    Component Value Date/Time   CHOL 157 11/13/2011 2357   TRIG 117 11/13/2011 2357   HDL 59 11/13/2011 2357   CHOLHDL 2.7 11/13/2011 2357   VLDL 23 11/13/2011 2357   LDLCALC 75 11/13/2011 2357      ASSESSMENT:    1. Possible Renovascular hypertension   2. Renal artery stenosis ([place])   3. Coronary artery disease involving coronary bypass graft of native heart without angina pectoris   4. Diastolic dysfunction   5. Vertebrobasilar artery stenosis   6. History of stroke   7. Hyperlipidemia      PLAN:  In order of problems listed above:  1. It is unusual for her to have developed significant hypertension in her 53s. Judging by the wealth of vascular problems that she has had in the past would not be very surprising for her to have renal artery stenosis. We'll get the results of her most recent chemistry panel from her primary care physician and will also schedule renal artery duplex ultrasonography. We'll start a low-dose of lisinopril 2. Check Doppler ultrasound 3. CAD: Currently without any symptoms of angina pectoris. On appropriate antiplatelet, statin therapy without need for antianginal medication 4. Diastolic dysfunction by echo: No symptoms of heart failure, no clinical signs of hypervolemia 5. ASCVD: She has severe stenosis in the vertebral basilar system with occluded left vertebral artery and left PICA, thankfully currently without any attributable  symptoms. She has had a stroke in the past. 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 History of stroke: MRI 2014 showed remote posterior right corona radiata infarction, possibly associated with her tendency for foot drop 7. HLP: Get her most recent lipid profile from her primary care provider, but she was told that the lipid parameters were excellent    Medication Adjustments/Labs and Tests Ordered: Current medicines are reviewed at length with the patient today.  Concerns regarding medicines are outlined above.  Medication changes, Labs and Tests ordered today are listed in the Patient Instructions below. Patient Instructions  Medication Instructions:  Your physician has recommended you make the following change in your medication:  START Lisinopril 5mg  daily. An Rx has been sent to your pharmacy   Labwork: Your physician recommends that you return for lab work in: 1 month (Bmet)   Testing/Procedures: Your physician has requested that you have a renal artery duplex. During this test, an ultrasound is used to evaluate blood flow to the kidneys. Allow one hour for this exam. Do not eat after midnight the day before and avoid carbonated beverages. Take your medications as you usually do.   Follow-Up: Your physician recommends that you schedule a follow-up appointment in: 3 months with Dr.Jabaree Mercado   Any Other Special Instructions Will Be Listed Below (If Applicable).     If you need a refill on your cardiac medications before your next appointment, please call your pharmacy.        Mikael Spray, MD  07/25/2015 6:21 PM    [address], [place], [place]  [address] Phone: [phone]; Fax: [phone]

## 2015-07-24 NOTE — Patient Instructions (Signed)
Medication Instructions:  Your physician has recommended you make the following change in your medication:  START Lisinopril 5mg  daily. An Rx has been sent to your pharmacy   Labwork: Your physician recommends that you return for lab work in: 1 month (Bmet)   Testing/Procedures: Your physician has requested that you have a renal artery duplex. During this test, an ultrasound is used to evaluate blood flow to the kidneys. Allow one hour for this exam. Do not eat after midnight the day before and avoid carbonated beverages. Take your medications as you usually do.   Follow-Up: Your physician recommends that you schedule a follow-up appointment in: 3 months with Dr.Croitoru   Any Other Special Instructions Will Be Listed Below (If Applicable).     If you need a refill on your cardiac medications before your next appointment, please call your pharmacy.

## 2015-07-25 DIAGNOSIS — I651 Occlusion and stenosis of basilar artery: Secondary | ICD-10-CM

## 2015-07-25 DIAGNOSIS — I150 Renovascular hypertension: Secondary | ICD-10-CM | POA: Insufficient documentation

## 2015-07-25 DIAGNOSIS — E785 Hyperlipidemia, unspecified: Secondary | ICD-10-CM | POA: Insufficient documentation

## 2015-07-25 DIAGNOSIS — Z8673 Personal history of transient ischemic attack (TIA), and cerebral infarction without residual deficits: Secondary | ICD-10-CM | POA: Insufficient documentation

## 2015-07-25 DIAGNOSIS — I6509 Occlusion and stenosis of unspecified vertebral artery: Secondary | ICD-10-CM | POA: Insufficient documentation

## 2015-07-30 ENCOUNTER — Ambulatory Visit (HOSPITAL_COMMUNITY)
Admission: RE | Admit: 2015-07-30 | Discharge: 2015-07-30 | Disposition: A | Discharge status: Home / Self Care | Payer: Medicare Other | Source: Ambulatory Visit | Attending: Cardiovascular Disease | Admitting: Cardiovascular Disease

## 2015-07-30 DIAGNOSIS — N281 Cyst of kidney, acquired: Secondary | ICD-10-CM | POA: Diagnosis not present

## 2015-07-30 DIAGNOSIS — E7800 Pure hypercholesterolemia, unspecified: Secondary | ICD-10-CM | POA: Diagnosis not present

## 2015-07-30 DIAGNOSIS — I701 Atherosclerosis of renal artery: Secondary | ICD-10-CM | POA: Insufficient documentation

## 2015-08-16 ENCOUNTER — Emergency Department (HOSPITAL_BASED_OUTPATIENT_CLINIC_OR_DEPARTMENT_OTHER): Payer: Medicare Other

## 2015-08-16 ENCOUNTER — Encounter (HOSPITAL_BASED_OUTPATIENT_CLINIC_OR_DEPARTMENT_OTHER): Payer: Self-pay | Admitting: *Deleted

## 2015-08-16 ENCOUNTER — Observation Stay (HOSPITAL_COMMUNITY): Payer: Medicare Other

## 2015-08-16 ENCOUNTER — Inpatient Hospital Stay (HOSPITAL_BASED_OUTPATIENT_CLINIC_OR_DEPARTMENT_OTHER)
Admission: EM | Admit: 2015-08-16 | Discharge: 2015-08-19 | DRG: 066 | Disposition: A | Discharge status: Home Health | Payer: Medicare Other | Attending: Family Medicine | Admitting: Family Medicine

## 2015-08-16 DIAGNOSIS — I671 Cerebral aneurysm, nonruptured: Secondary | ICD-10-CM | POA: Diagnosis present

## 2015-08-16 DIAGNOSIS — Z7983 Long term (current) use of bisphosphonates: Secondary | ICD-10-CM

## 2015-08-16 DIAGNOSIS — Z8673 Personal history of transient ischemic attack (TIA), and cerebral infarction without residual deficits: Secondary | ICD-10-CM | POA: Diagnosis not present

## 2015-08-16 DIAGNOSIS — I639 Cerebral infarction, unspecified: Secondary | ICD-10-CM | POA: Diagnosis present

## 2015-08-16 DIAGNOSIS — G459 Transient cerebral ischemic attack, unspecified: Secondary | ICD-10-CM | POA: Diagnosis not present

## 2015-08-16 DIAGNOSIS — I720 Aneurysm of carotid artery: Secondary | ICD-10-CM | POA: Diagnosis not present

## 2015-08-16 DIAGNOSIS — Z951 Presence of aortocoronary bypass graft: Secondary | ICD-10-CM | POA: Diagnosis not present

## 2015-08-16 DIAGNOSIS — R29810 Facial weakness: Secondary | ICD-10-CM | POA: Diagnosis present

## 2015-08-16 DIAGNOSIS — E785 Hyperlipidemia, unspecified: Secondary | ICD-10-CM | POA: Diagnosis present

## 2015-08-16 DIAGNOSIS — Z79899 Other long term (current) drug therapy: Secondary | ICD-10-CM

## 2015-08-16 DIAGNOSIS — H5340 Unspecified visual field defects: Secondary | ICD-10-CM | POA: Diagnosis not present

## 2015-08-16 DIAGNOSIS — I2510 Atherosclerotic heart disease of native coronary artery without angina pectoris: Secondary | ICD-10-CM | POA: Diagnosis present

## 2015-08-16 DIAGNOSIS — K219 Gastro-esophageal reflux disease without esophagitis: Secondary | ICD-10-CM | POA: Diagnosis present

## 2015-08-16 DIAGNOSIS — I10 Essential (primary) hypertension: Secondary | ICD-10-CM | POA: Diagnosis present

## 2015-08-16 DIAGNOSIS — Z7982 Long term (current) use of aspirin: Secondary | ICD-10-CM

## 2015-08-16 DIAGNOSIS — E039 Hypothyroidism, unspecified: Secondary | ICD-10-CM | POA: Diagnosis present

## 2015-08-16 DIAGNOSIS — I6312 Cerebral infarction due to embolism of basilar artery: Principal | ICD-10-CM | POA: Diagnosis present

## 2015-08-16 DIAGNOSIS — F329 Major depressive disorder, single episode, unspecified: Secondary | ICD-10-CM | POA: Diagnosis present

## 2015-08-16 DIAGNOSIS — Z955 Presence of coronary angioplasty implant and graft: Secondary | ICD-10-CM

## 2015-08-16 DIAGNOSIS — Z96642 Presence of left artificial hip joint: Secondary | ICD-10-CM | POA: Diagnosis present

## 2015-08-16 DIAGNOSIS — G4733 Obstructive sleep apnea (adult) (pediatric): Secondary | ICD-10-CM | POA: Diagnosis present

## 2015-08-16 DIAGNOSIS — Z7902 Long term (current) use of antithrombotics/antiplatelets: Secondary | ICD-10-CM

## 2015-08-16 DIAGNOSIS — I150 Renovascular hypertension: Secondary | ICD-10-CM | POA: Diagnosis present

## 2015-08-16 LAB — URINALYSIS, ROUTINE W REFLEX MICROSCOPIC
Bilirubin Urine: NEGATIVE
Glucose, UA: NEGATIVE mg/dL
Hgb urine dipstick: NEGATIVE
Ketones, ur: NEGATIVE mg/dL
Nitrite: NEGATIVE
PROTEIN: NEGATIVE mg/dL
SPECIFIC GRAVITY, URINE: 1.015 (ref 1.005–1.030)
pH: 6.5 (ref 5.0–8.0)

## 2015-08-16 LAB — PROTIME-INR
INR: 0.89 (ref 0.00–1.49)
Prothrombin Time: 12.3 seconds (ref 11.6–15.2)

## 2015-08-16 LAB — COMPREHENSIVE METABOLIC PANEL
ALBUMIN: 3.8 g/dL (ref 3.5–5.0)
ALK PHOS: 57 U/L (ref 38–126)
ALT: 18 U/L (ref 14–54)
ANION GAP: 8 (ref 5–15)
AST: 20 U/L (ref 15–41)
BILIRUBIN TOTAL: 0.3 mg/dL (ref 0.3–1.2)
BUN: 16 mg/dL (ref 6–20)
CALCIUM: 9.1 mg/dL (ref 8.9–10.3)
CO2: 28 mmol/L (ref 22–32)
CREATININE: 0.44 mg/dL (ref 0.44–1.00)
Chloride: 96 mmol/L — ABNORMAL LOW (ref 101–111)
GFR calc non Af Amer: >60 mL/min (ref >60)
GLUCOSE: 102 mg/dL — AB (ref 65–99)
Potassium: 4.3 mmol/L (ref 3.5–5.1)
SODIUM: 132 mmol/L — AB (ref 135–145)
TOTAL PROTEIN: 7 g/dL (ref 6.5–8.1)

## 2015-08-16 LAB — TROPONIN I: Troponin I: <0.03 ng/mL (ref <0.031)

## 2015-08-16 LAB — CBC
HCT: 36.9 % (ref 36.0–46.0)
HEMOGLOBIN: 12.1 g/dL (ref 12.0–15.0)
MCH: 30.9 pg (ref 26.0–34.0)
MCHC: 32.8 g/dL (ref 30.0–36.0)
MCV: 94.4 fL (ref 78.0–100.0)
Platelets: 235 10*3/uL (ref 150–400)
RBC: 3.91 MIL/uL (ref 3.87–5.11)
RDW: 12.4 % (ref 11.5–15.5)
WBC: 9.7 10*3/uL (ref 4.0–10.5)

## 2015-08-16 LAB — DIFFERENTIAL
Basophils Absolute: 0 10*3/uL (ref 0.0–0.1)
Basophils Relative: 0 %
EOS PCT: 3 %
Eosinophils Absolute: 0.3 10*3/uL (ref 0.0–0.7)
LYMPHS ABS: 3.4 10*3/uL (ref 0.7–4.0)
LYMPHS PCT: 35 %
MONO ABS: 0.8 10*3/uL (ref 0.1–1.0)
Monocytes Relative: 8 %
NEUTROS ABS: 5.2 10*3/uL (ref 1.7–7.7)
Neutrophils Relative %: 54 %

## 2015-08-16 LAB — RAPID URINE DRUG SCREEN, HOSP PERFORMED
Amphetamines: NOT DETECTED
BARBITURATES: NOT DETECTED
BENZODIAZEPINES: NOT DETECTED
Cocaine: NOT DETECTED
Opiates: NOT DETECTED
Tetrahydrocannabinol: NOT DETECTED

## 2015-08-16 LAB — URINE MICROSCOPIC-ADD ON

## 2015-08-16 LAB — ETHANOL: Alcohol, Ethyl (B): <5 mg/dL (ref <5)

## 2015-08-16 LAB — APTT: aPTT: 27 seconds (ref 24–37)

## 2015-08-16 LAB — CBG MONITORING, ED: GLUCOSE-CAPILLARY: 96 mg/dL (ref 65–99)

## 2015-08-16 MED ORDER — MIRABEGRON ER 25 MG PO TB24
50.0000 mg | ORAL_TABLET | Freq: Two times a day (BID) | ORAL | Status: DC
  Administered 2015-08-17 – 2015-08-19 (×5): 50 mg via ORAL
  Filled 2015-08-16: qty 2
  Filled 2015-08-16: qty 1
  Filled 2015-08-16 (×4): qty 2

## 2015-08-16 MED ORDER — ENOXAPARIN SODIUM 40 MG/0.4ML ~~LOC~~ SOLN
40.0000 mg | SUBCUTANEOUS | Status: DC
  Administered 2015-08-17 – 2015-08-19 (×3): 40 mg via SUBCUTANEOUS
  Filled 2015-08-16 (×3): qty 0.4

## 2015-08-16 MED ORDER — LEVOTHYROXINE SODIUM 75 MCG PO TABS
75.0000 ug | ORAL_TABLET | Freq: Every day | ORAL | Status: DC
  Administered 2015-08-17 – 2015-08-19 (×3): 75 ug via ORAL
  Filled 2015-08-16 (×3): qty 1

## 2015-08-16 MED ORDER — SERTRALINE HCL 50 MG PO TABS
75.0000 mg | ORAL_TABLET | Freq: Every day | ORAL | Status: DC
  Administered 2015-08-17 – 2015-08-19 (×3): 75 mg via ORAL
  Filled 2015-08-16 (×3): qty 2

## 2015-08-16 MED ORDER — MECLIZINE HCL 12.5 MG PO TABS: 25.0000 mg | ORAL_TABLET | Freq: Three times a day (TID) | ORAL | Status: DC | PRN

## 2015-08-16 MED ORDER — ASPIRIN EC 81 MG PO TBEC
81.0000 mg | DELAYED_RELEASE_TABLET | Freq: Every evening | ORAL | Status: DC
  Administered 2015-08-17 – 2015-08-18 (×2): 81 mg via ORAL
  Filled 2015-08-16 (×2): qty 1

## 2015-08-16 MED ORDER — ROSUVASTATIN CALCIUM 20 MG PO TABS
20.0000 mg | ORAL_TABLET | Freq: Every day | ORAL | Status: DC
  Administered 2015-08-17 – 2015-08-18 (×2): 20 mg via ORAL
  Filled 2015-08-16 (×4): qty 1

## 2015-08-16 MED ORDER — STROKE: EARLY STAGES OF RECOVERY BOOK
Freq: Once | Status: AC
  Administered 2015-08-16: 1
  Filled 2015-08-16: qty 1

## 2015-08-16 MED ORDER — PANTOPRAZOLE SODIUM 40 MG PO TBEC
40.0000 mg | DELAYED_RELEASE_TABLET | Freq: Every day | ORAL | Status: DC
  Administered 2015-08-17 – 2015-08-19 (×3): 40 mg via ORAL
  Filled 2015-08-16 (×3): qty 1

## 2015-08-16 MED ORDER — LISINOPRIL 5 MG PO TABS
5.0000 mg | ORAL_TABLET | Freq: Every day | ORAL | Status: DC
  Administered 2015-08-17 – 2015-08-19 (×3): 5 mg via ORAL
  Filled 2015-08-16 (×3): qty 1

## 2015-08-16 MED ORDER — POLYVINYL ALCOHOL 1.4 % OP SOLN
2.0000 [drp] | Freq: Three times a day (TID) | OPHTHALMIC | Status: DC
  Administered 2015-08-17 – 2015-08-19 (×7): 2 [drp] via OPHTHALMIC
  Filled 2015-08-16 (×2): qty 15

## 2015-08-16 MED ORDER — CETYLPYRIDINIUM CHLORIDE 0.05 % MT LIQD
7.0000 mL | Freq: Two times a day (BID) | OROMUCOSAL | Status: DC
Start: 2015-08-16 — End: 2015-08-19
  Administered 2015-08-17 – 2015-08-19 (×3): 7 mL via OROMUCOSAL

## 2015-08-16 MED ORDER — CLOPIDOGREL BISULFATE 75 MG PO TABS
75.0000 mg | ORAL_TABLET | Freq: Every day | ORAL | Status: DC
  Administered 2015-08-17 – 2015-08-19 (×3): 75 mg via ORAL
  Filled 2015-08-16 (×3): qty 1

## 2015-08-16 NOTE — ED Notes (Signed)
Pt placed on automatic VS, continuous pulse ox and cardiac monitoring. 

## 2015-08-16 NOTE — Consult Note (Signed)
Admission H&P    Chief Complaint: Left visual field changes and facial droop.  HPI: Gina Bowers is an 82 y.o. female with a history of previous stroke and TIAs, hypertension, hyperlipidemia and coronary artery disease, presenting with acute onset of left visual field changes and left facial droop. She described visual changes as blurred vision with ink colored lights involving the upper half of her vision on the left side. Onset was at 4:30 PM today. CT scan of her head showed no acute intracranial abnormality. Deficits subsequently resolved. Patient has been taking aspirin and Plavix daily. She had no speech changes nor weakness and numbness involving left extremities.  LSN: 4:30 PM on 08/16/2015 tPA Given: No: Deficits resolved mRankin:  Past Medical History  Diagnosis Date  . Coronary artery disease     DR. Rollene Fare IS PT'S CARDIOLOGIST - HX CABG AND HAS STENT AND TOLD SHE HAS ANOTHER BLOCKAGE-BEING TREATED MEDICALLY  . High cholesterol   . Lung nodules     TOLD SHE HAS INTERSTITUAL LUNG DISEASE  . Shortness of breath     AND WHEEZING AT TIMES--NO INHALERS  . Vertigo   . Arthritis   . GERD (gastroesophageal reflux disease)   . Urinary incontinence   . UTI (lower urinary tract infection)     HX OF UTI'S-- LAST TIME COUPLE OF MONTHS AGO  . Hypothyroidism   . Blood transfusion 2007    AFTER HIP REPLACEMENT  . Fracture FEB 2013    FRACTURED LEFT ANKLE--NO SURGERY--WAS IN BOOT UNTIL COUPLE WEEKS AGO  . Depression   . Carotid bruit     PT'S DAUGHTER STATES PT HAD RECENT CAROTID STUDY AND WAS TOLD NO SIGNIFICANT BLOCKAGES  . [place] virus     COUPLE OF WEEKS AGO--ALL SYMTOMS RESOLVED PER PT  . Kidney stones     IN THE PAST  . Stroke ([place])     SEVERAL STROKES -TOLD SHE HAS CEREBELLUM BLOCKAGE AS RESULT OF STROKE--BUT NEUROLOGIST SAID HE DID NOT WANT TO DO ANY PROCEDURE BECAUSE OF HER AGE .  PT HAS SLIGHT LEFT FOOT DROP-DRAGS FOOT WHEN WALKING - USES WALKER  . CAD (coronary  artery disease) of artery bypass graft 06/06/2014     2009 CABG 4, Dr. Servando Snare (LIMA to LAD , SVG to diagonal, SVG to circumflex, SVG to PDA  Cath 2012 in  Recent ill,[place] patent LIMA ,  Patent SVG to diagonal occluded SVG to RCA, report 5 x 18 BMS stent to native RCA  . Ischemic colitis ([place]) 2010  . Obstructive sleep apnea 2010  . CHF (congestive heart failure) [facility])     Past Surgical History  Procedure Laterality Date  . Coronary angioplasty  10/2010    WITH STENT PLACEMENT  . Coronary artery bypass graft  2009  . Joint replacement  2007    HIP REPLACEMENT -LEFT  . Bladder tack      1969 AND 1990  . Cystoscopy with injection  09/21/2011    Procedure: CYSTOSCOPY WITH INJECTION;  Surgeon: Ailene Rud, MD;  Location: [facility];  Service: Urology;  Laterality: N/A;  Peri Urethral Macroplastique Injection and Estring Placement    Family History  Problem Relation Age of Onset  . Heart disease Mother    Social History:  reports that she has never smoked. She has never used smokeless tobacco. She reports that she does not drink alcohol or use illicit drugs.  Allergies:  Allergies  Allergen Reactions  . Sulfonamide Derivatives Hives  Medications: Patient's preadmission medications were reviewed by me.  ROS: History obtained from the patient and her granddaughter.  General ROS: negative for - chills, fatigue, fever, night sweats, weight gain or weight loss Psychological ROS: negative for - behavioral disorder, hallucinations, memory difficulties, mood swings or suicidal ideation Ophthalmic ROS: negative for - blurry vision, double vision, eye pain or loss of vision ENT ROS: negative for - epistaxis, nasal discharge, oral lesions, sore throat, tinnitus or vertigo Allergy and Immunology ROS: negative for - hives or itchy/watery eyes Hematological and Lymphatic ROS: negative for - bleeding problems, bruising or swollen lymph nodes Endocrine ROS: negative for -  galactorrhea, hair pattern changes, polydipsia/polyuria or temperature intolerance Respiratory ROS: negative for - cough, hemoptysis, shortness of breath or wheezing Cardiovascular ROS: negative for - chest pain, dyspnea on exertion, edema or irregular heartbeat Gastrointestinal ROS: negative for - abdominal pain, diarrhea, hematemesis, nausea/vomiting or stool incontinence Genito-Urinary ROS: negative for - dysuria, hematuria, incontinence or urinary frequency/urgency Musculoskeletal ROS: negative for - joint swelling or muscular weakness Neurological ROS: as noted in HPI Dermatological ROS: negative for rash and skin lesion changes  Physical Examination: Blood pressure 153/77, pulse 75, temperature 98.1 F (36.7 C), temperature source Oral, resp. rate 14, height 5' 1"  (1.549 m), weight 77.111 kg (170 lb), SpO2 93 %.  HEENT-  Normocephalic, no lesions, without obvious abnormality.  Normal external eye and conjunctiva.  Normal TM's bilaterally.  Normal auditory canals and external ears. Normal external nose, mucus membranes and septum.  Normal pharynx. Neck supple with no masses, nodes, nodules or enlargement. Cardiovascular - regular rate and rhythm, S1, S2 normal, no murmur, click, rub or gallop Lungs - chest clear, no wheezing, rales, normal symmetric air entry Abdomen - soft, non-tender; bowel sounds normal; no masses,  no organomegaly Extremities - no joint deformities, effusion, or inflammation and no edema  Neurologic Examination: Mental Status: Alert, oriented, thought content appropriate.  Speech fluent without evidence of aphasia. Able to follow commands without difficulty. Cranial Nerves: II-Visual fields were normal. III/IV/VI-Pupils were equal and reacted normally to light. Extraocular movements were full and conjugate.    V/VII-no facial numbness and no facial weakness. VIII-normal. X-normal speech and symmetrical palatal movement. XI: trapezius strength/neck flexion  strength normal bilaterally XII-midline tongue extension with normal strength. Motor: 5/5 bilaterally with normal tone and bulk Sensory: Normal throughout. Deep Tendon Reflexes: 2+ and symmetric. Plantars: Mute bilaterally Cerebellar: Normal finger-to-nose testing. Carotid auscultation: Normal  Results for orders placed or performed during the hospital encounter of 08/16/15 (from the past 48 hour(s))  Ethanol     Status: None   Collection Time: 08/16/15  7:40 PM  Result Value Ref Range   Alcohol, Ethyl (B) <5 <5 mg/dL    Comment:        LOWEST DETECTABLE LIMIT FOR SERUM ALCOHOL IS 5 mg/dL FOR MEDICAL PURPOSES ONLY   Protime-INR     Status: None   Collection Time: 08/16/15  7:40 PM  Result Value Ref Range   Prothrombin Time 12.3 11.6 - 15.2 seconds   INR 0.89 0.00 - 1.49  APTT     Status: None   Collection Time: 08/16/15  7:40 PM  Result Value Ref Range   aPTT 27 24 - 37 seconds  CBC     Status: None   Collection Time: 08/16/15  7:40 PM  Result Value Ref Range   WBC 9.7 4.0 - 10.5 K/uL   RBC 3.91 3.87 - 5.11 MIL/uL   Hemoglobin 12.1 12.0 -  15.0 g/dL   HCT 36.9 36.0 - 46.0 %   MCV 94.4 78.0 - 100.0 fL   MCH 30.9 26.0 - 34.0 pg   MCHC 32.8 30.0 - 36.0 g/dL   RDW 12.4 11.5 - 15.5 %   Platelets 235 150 - 400 K/uL  Differential     Status: None   Collection Time: 08/16/15  7:40 PM  Result Value Ref Range   Neutrophils Relative % 54 %   Neutro Abs 5.2 1.7 - 7.7 K/uL   Lymphocytes Relative 35 %   Lymphs Abs 3.4 0.7 - 4.0 K/uL   Monocytes Relative 8 %   Monocytes Absolute 0.8 0.1 - 1.0 K/uL   Eosinophils Relative 3 %   Eosinophils Absolute 0.3 0.0 - 0.7 K/uL   Basophils Relative 0 %   Basophils Absolute 0.0 0.0 - 0.1 K/uL  Comprehensive metabolic panel     Status: Abnormal   Collection Time: 08/16/15  7:40 PM  Result Value Ref Range   Sodium 132 (L) 135 - 145 mmol/L   Potassium 4.3 3.5 - 5.1 mmol/L   Chloride 96 (L) 101 - 111 mmol/L   CO2 28 22 - 32 mmol/L    Glucose, Bld 102 (H) 65 - 99 mg/dL   BUN 16 6 - 20 mg/dL   Creatinine, Ser 0.44 0.44 - 1.00 mg/dL   Calcium 9.1 8.9 - 10.3 mg/dL   Total Protein 7.0 6.5 - 8.1 g/dL   Albumin 3.8 3.5 - 5.0 g/dL   AST 20 15 - 41 U/L   ALT 18 14 - 54 U/L   Alkaline Phosphatase 57 38 - 126 U/L   Total Bilirubin 0.3 0.3 - 1.2 mg/dL   GFR calc non Af Amer >60 >60 mL/min   GFR calc Af Amer >60 >60 mL/min    Comment: (NOTE) The eGFR has been calculated using the CKD EPI equation. This calculation has not been validated in all clinical situations. eGFR's persistently <60 mL/min signify possible Chronic Kidney Disease.    Anion gap 8 5 - 15  Troponin I     Status: None   Collection Time: 08/16/15  7:40 PM  Result Value Ref Range   Troponin I <0.03 <0.031 ng/mL    Comment:        NO INDICATION OF MYOCARDIAL INJURY.   CBG monitoring, ED     Status: None   Collection Time: 08/16/15  7:46 PM  Result Value Ref Range   Glucose-Capillary 96 65 - 99 mg/dL   Ct Head Wo Contrast  08/16/2015  CLINICAL DATA:  Vision changes at 16:30 this afternoon. Left facial droop. Code stroke. EXAM: CT HEAD WITHOUT CONTRAST TECHNIQUE: Contiguous axial images were obtained from the base of the skull through the vertex without intravenous contrast. COMPARISON:  CT head 03/11/2013.  MRI brain 07/31/2012. FINDINGS: Diffuse cerebral atrophy. Ventricular dilatation consistent with central atrophy. Low-attenuation changes throughout the deep white matter bilaterally is nonspecific but likely represent small vessel ischemic change. Slight asymmetry of white matter changes to the right. Can't exclude early ischemia. Old lacunar infarcts. No mass effect or midline shift. No abnormal extra-axial fluid collections. Gray-white matter junctions are distinct. Basal cisterns are not effaced. No evidence of acute intracranial hemorrhage. No depressed skull fractures. Visualized paranasal sinuses and mastoid air cells are not opacified. Vascular  calcifications. IMPRESSION: Chronic atrophy and small vessel ischemic changes. Slight asymmetry of white matter changes on the right. Can't exclude acute ischemia. No mass effect or midline shift. No  acute intracranial hemorrhage. These results were called by telephone at the time of interpretation on 08/16/2015 at 7:59 pm to Dr. Julianne Rice , who verbally acknowledged these results. Electronically Signed   By: Lucienne Capers M.D.   On: 08/16/2015 20:01    Assessment: 82 y.o. female with multiple risk factors for stroke as well as previous stroke and TIA presenting with probable recurrent transient ischemic attack. However, a small right subcortical treatment infarction cannot be ruled out at this point.  Stroke Risk Factors - hyperlipidemia and hypertension  Plan: 1. HgbA1c, fasting lipid panel 2. MRI, MRA  of the brain without contrast 3. PT consult, OT consult, Speech consult 4. Echocardiogram 5. Carotid dopplers 6. Prophylactic therapy - aspirin and Plavix 7. Risk factor modification 8. Telemetry monitoring  C.R. Nicole Kindred, MD [facility] [phone]  08/16/2015, 9:16 PM

## 2015-08-16 NOTE — ED Notes (Signed)
Report given to Sonia Side RN with [facility] .

## 2015-08-16 NOTE — ED Provider Notes (Signed)
CSN: [id number]     Arrival date & time 08/16/15  1915 History   First MD Initiated Contact with Patient 08/16/15 1930     Chief Complaint  Patient presents with  . Code Stroke     (Consider location/radiation/quality/duration/timing/severity/associated sxs/prior Treatment) HPI Patient presents with acute onset left visual field deficit and left lower facial droop. Started at 1630 this afternoon. States that her vision and the droop have improved though not resolved. No new left-sided weakness or numbness. Patient has history of multiple TIAs and CVAs in the past. States she took 3 aspirin prior to arrival. Denies any changes in her vision. No chest pain or shortness of breath. Patient is ambulating without any difficulty. She has ongoing left-sided posterior neck pain. She states she has known "blockage" in her arteries to her brain. Past Medical History  Diagnosis Date  . Coronary artery disease     DR. Rollene Fare IS PT'S CARDIOLOGIST - HX CABG AND HAS STENT AND TOLD SHE HAS ANOTHER BLOCKAGE-BEING TREATED MEDICALLY  . High cholesterol   . Lung nodules     TOLD SHE HAS INTERSTITUAL LUNG DISEASE  . Shortness of breath     AND WHEEZING AT TIMES--NO INHALERS  . Vertigo   . Arthritis   . GERD (gastroesophageal reflux disease)   . Urinary incontinence   . UTI (lower urinary tract infection)     HX OF UTI'S-- LAST TIME COUPLE OF MONTHS AGO  . Hypothyroidism   . Blood transfusion 2007    AFTER HIP REPLACEMENT  . Fracture FEB 2013    FRACTURED LEFT ANKLE--NO SURGERY--WAS IN BOOT UNTIL COUPLE WEEKS AGO  . Depression   . Carotid bruit     PT'S DAUGHTER STATES PT HAD RECENT CAROTID STUDY AND WAS TOLD NO SIGNIFICANT BLOCKAGES  . [place] virus     COUPLE OF WEEKS AGO--ALL SYMTOMS RESOLVED PER PT  . Kidney stones     IN THE PAST  . Stroke ([place])     SEVERAL STROKES -TOLD SHE HAS CEREBELLUM BLOCKAGE AS RESULT OF STROKE--BUT NEUROLOGIST SAID HE DID NOT WANT TO DO ANY PROCEDURE BECAUSE OF HER  AGE .  PT HAS SLIGHT LEFT FOOT DROP-DRAGS FOOT WHEN WALKING - USES WALKER  . CAD (coronary artery disease) of artery bypass graft 06/06/2014     2009 CABG 4, Dr. Servando Snare (LIMA to LAD , SVG to diagonal, SVG to circumflex, SVG to PDA  Cath 2012 in  Recent ill,[place] patent LIMA ,  Patent SVG to diagonal occluded SVG to RCA, report 5 x 18 BMS stent to native RCA  . Ischemic colitis ([place]) 2010  . Obstructive sleep apnea 2010  . CHF (congestive heart failure) [facility])    Past Surgical History  Procedure Laterality Date  . Coronary angioplasty  10/2010    WITH STENT PLACEMENT  . Coronary artery bypass graft  2009  . Joint replacement  2007    HIP REPLACEMENT -LEFT  . Bladder tack      1969 AND 1990  . Cystoscopy with injection  09/21/2011    Procedure: CYSTOSCOPY WITH INJECTION;  Surgeon: Ailene Rud, MD;  Location: [facility];  Service: Urology;  Laterality: N/A;  Peri Urethral Macroplastique Injection and Estring Placement   Family History  Problem Relation Age of Onset  . Heart disease Mother    Social History  Substance Use Topics  . Smoking status: Never Smoker   . Smokeless tobacco: Never Used  . Alcohol Use: No   OB History  No data available     Review of Systems  Constitutional: Negative for fever and chills.  HENT: Negative for facial swelling.   Eyes: Positive for visual disturbance. Negative for pain.  Respiratory: Negative for cough and shortness of breath.   Cardiovascular: Negative for chest pain.  Gastrointestinal: Negative for nausea, vomiting, abdominal pain and diarrhea.  Musculoskeletal: Positive for neck pain. Negative for myalgias and back pain.  Skin: Negative for rash and wound.  Neurological: Negative for dizziness, speech difficulty, weakness, light-headedness, numbness and headaches.  All other systems reviewed and are negative.     Allergies  Sulfonamide derivatives  Home Medications   Prior to Admission medications   Medication Sig  Start Date End Date Taking? Authorizing Provider  alendronate (FOSAMAX) 70 MG tablet Take 1 tablet (70 mg total) by mouth once a week. Take with a full glass of water on an empty stomach. Patient taking differently: Take 70 mg by mouth once a week. Take with a full glass of water on an empty stomach.mon 06/06/14  Yes Mihai Croitoru, MD  aspirin EC 81 MG tablet Take 81 mg by mouth every evening.    Yes Historical Provider, MD  Calcium Carb-Cholecalciferol (CALCIUM 1000 + D PO) Take 2 tablets by mouth daily.   Yes Historical Provider, MD  cephALEXin (KEFLEX) 250 MG capsule Take 1 capsule (250 mg total) by mouth daily. Patient taking differently: Take 250 mg by mouth daily. See 06/06/14  Yes Mihai Croitoru, MD  clopidogrel (PLAVIX) 75 MG tablet Take 1 tablet (75 mg total) by mouth daily. 06/06/14  Yes Mihai Croitoru, MD  CRANBERRY EXTRACT PO Take 500 mg by mouth daily.   Yes Historical Provider, MD  fish oil-omega-3 fatty acids 1000 MG capsule Take 1 g by mouth daily with breakfast.   Yes Historical Provider, MD  levothyroxine (SYNTHROID, LEVOTHROID) 75 MCG tablet Take 1 tablet (75 mcg total) by mouth daily before breakfast. 06/06/14  Yes Mihai Croitoru, MD  lisinopril (PRINIVIL,ZESTRIL) 5 MG tablet Take 1 tablet (5 mg total) by mouth daily. 07/24/15  Yes Mihai Croitoru, MD  meclizine (ANTIVERT) 25 MG tablet Take 25 mg by mouth 3 (three) times daily as needed for dizziness.   Yes Historical Provider, MD  mirabegron ER (MYRBETRIQ) 50 MG TB24 tablet Take 1 tablet (50 mg total) by mouth 2 (two) times daily. 06/06/14  Yes Mihai Croitoru, MD  Multiple Vitamin (MULITIVITAMIN WITH MINERALS) TABS Take 1 tablet by mouth daily with breakfast.   Yes Historical Provider, MD  ondansetron (ZOFRAN ODT) 4 MG disintegrating tablet Take one every 6 hours for nausea 06/06/14  Yes Mihai Croitoru, MD  OVER THE COUNTER MEDICATION Take 2 capsules by mouth daily. Immuplex Immune Booster. Takes during the winter months   Yes Historical  Provider, MD  pantoprazole (PROTONIX) 40 MG tablet Take 1 tablet (40 mg total) by mouth daily. 06/04/15  Yes Mihai Croitoru, MD  Polyethyl Glycol-Propyl Glycol (SYSTANE OP) Place 1 drop into both eyes 3 (three) times daily. Patient states its the Gel form. Not sure what percentage. Take every day per patient   Yes Historical Provider, MD  rosuvastatin (CRESTOR) 20 MG tablet TAKE 1 TABLET (20 MG TOTAL) BY MOUTH EVERY EVENING. 06/25/15  Yes Mihai Croitoru, MD  sertraline (ZOLOFT) 50 MG tablet 1.5 tablets by mouth every day 06/06/14  Yes Mihai Croitoru, MD   BP 155/74 mmHg  Pulse 67  Temp(Src) 97.9 F (36.6 C) (Oral)  Resp 18  Ht 5\' 1"  (1.549 m)  Wt 170 lb (77.111 kg)  BMI 32.14 kg/m2  SpO2 91% Physical Exam  Constitutional: She is oriented to person, place, and time. She appears well-developed and well-nourished. No distress.  HENT:  Head: Normocephalic and atraumatic.  Mouth/Throat: Oropharynx is clear and moist. No oropharyngeal exudate.  Eyes: EOM are normal. Pupils are equal, round, and reactive to light.  Neck: Normal range of motion. Neck supple.  No bruits appreciated. No meningismus. No midline cervical tenderness to palpation.  Cardiovascular: Normal rate and regular rhythm.  Exam reveals no gallop and no friction rub.   No murmur heard. Pulmonary/Chest: Effort normal and breath sounds normal. No respiratory distress. She has no wheezes. She has no rales. She exhibits no tenderness.  Abdominal: Soft. Bowel sounds are normal. She exhibits no distension and no mass. There is no tenderness. There is no rebound and no guarding.  Musculoskeletal: Normal range of motion. She exhibits no edema or tenderness.  2+ dorsalis pedis pulses. No lower extremity swelling, tenderness or asymmetry.  Neurological: She is alert and oriented to person, place, and time.  Patient is alert and oriented x3 with clear, goal oriented speech. Patient has 5/5 motor in all extremities. Sensation is intact to  light touch. Bilateral finger-to-nose is normal with no signs of dysmetria. Patient has a normal gait and walks without assistance. Patient does have very slight droop to the left lower face compared to the right. Granddaughter states this is improved but new for the patient.  Skin: Skin is warm and dry. No rash noted. No erythema.  Psychiatric: She has a normal mood and affect. Her behavior is normal.  Nursing note and vitals reviewed.   ED Course  Procedures (including critical care time) Labs Review Labs Reviewed  COMPREHENSIVE METABOLIC PANEL - Abnormal; Notable for the following:    Sodium 132 (*)    Chloride 96 (*)    Glucose, Bld 102 (*)    All other components within normal limits  URINALYSIS, ROUTINE W REFLEX MICROSCOPIC (NOT AT [facility]) - Abnormal; Notable for the following:    APPearance CLOUDY (*)    Leukocytes, UA MODERATE (*)    All other components within normal limits  URINE MICROSCOPIC-ADD ON - Abnormal; Notable for the following:    Squamous Epithelial / LPF 0-5 (*)    Bacteria, UA RARE (*)    All other components within normal limits  ETHANOL  PROTIME-INR  APTT  CBC  DIFFERENTIAL  TROPONIN I  URINE RAPID DRUG SCREEN, HOSP PERFORMED  HEMOGLOBIN A1C  LIPID PANEL  CBG MONITORING, ED    Imaging Review Ct Head Wo Contrast  08/16/2015  CLINICAL DATA:  Vision changes at 16:30 this afternoon. Left facial droop. Code stroke. EXAM: CT HEAD WITHOUT CONTRAST TECHNIQUE: Contiguous axial images were obtained from the base of the skull through the vertex without intravenous contrast. COMPARISON:  CT head 03/11/2013.  MRI brain 07/31/2012. FINDINGS: Diffuse cerebral atrophy. Ventricular dilatation consistent with central atrophy. Low-attenuation changes throughout the deep white matter bilaterally is nonspecific but likely represent small vessel ischemic change. Slight asymmetry of white matter changes to the right. Can't exclude early ischemia. Old lacunar infarcts. No mass  effect or midline shift. No abnormal extra-axial fluid collections. Gray-white matter junctions are distinct. Basal cisterns are not effaced. No evidence of acute intracranial hemorrhage. No depressed skull fractures. Visualized paranasal sinuses and mastoid air cells are not opacified. Vascular calcifications. IMPRESSION: Chronic atrophy and small vessel ischemic changes. Slight asymmetry of white matter changes on the  right. Can't exclude acute ischemia. No mass effect or midline shift. No acute intracranial hemorrhage. These results were called by telephone at the time of interpretation on 08/16/2015 at 7:59 pm to Dr. Julianne Rice , who verbally acknowledged these results. Electronically Signed   By: Lucienne Capers M.D.   On: 08/16/2015 20:01   Mr Brain Wo Contrast  08/16/2015  CLINICAL DATA:  Acute onset LEFT visual field changes and LEFT facial droop at 4:30 p.m. today, now resolved. History of stroke and coronary artery disease, on anticoagulation. EXAM: MRI HEAD WITHOUT CONTRAST MRA HEAD WITHOUT CONTRAST TECHNIQUE: Multiplanar, multiecho pulse sequences of the brain and surrounding structures were obtained without intravenous contrast. Angiographic images of the head were obtained using MRA technique without contrast. COMPARISON:  CT head August 16, 2015 1951 hours and MRI/MRA head July 31, 2012 FINDINGS: MRI HEAD FINDINGS Subcentimeter focus of reduced diffusion RIGHT occipital lobe on coronal 9/ 78, not present on the axial T2 view. Punctate focus of susceptibility artifact LEFT mesial posterior frontal lobe, nonspecific. Moderate ventriculomegaly, unchanged from prior examination of base of global parenchymal brain volume loss. RIGHT insular encephalomalacia unchanged from prior examination. Prominent periventricular spaces bilateral basal ganglia with superimposed old RIGHT carina radiata/basal ganglia lacunar infarct. No midline shift, mass effect or mass lesions. Confluent supratentorial and  pontine white matter T2 hyperintensities are similar. No abnormal extra-axial fluid collections. Status post bilateral ocular lens implants. Trace paranasal sinus mucosal thickening without air-fluid levels. Mastoid air cells are well aerated. No abnormal sellar expansion. No cerebellar tonsillar ectopia. No suspicious calvarial bone marrow signal. Patient is edentulous. MRA HEAD FINDINGS Anterior circulation: Internal carotid arteries are patent bilaterally. Mildly ectatic distal LEFT petrous internal carotid artery. 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 Moderate, progressed stenosis RIGHT supraclinoid internal carotid artery. Luminal irregularity of the bilateral carotid siphons compatible with atherosclerosis. Normal appearance the anterior cerebral arteries, with patent anterior communicating artery. Normal flow related enhancement bilateral middle cerebral arteries with moderate luminal irregularity of the mid and distal segments. Posterior circulation: Chronically occluded LEFT vertebral artery, with similar at least moderate stenosis proximal RIGHT V4 segment. Retrograde flow into LEFT posterior-inferior cerebellar artery. Focal moderate to high-grade stenosis proximal basilar artery with poststenotic dolichoectasia. Patent RIGHT posterior communicating artery. Flow related enhancement within bilateral posterior cerebral arteries, patent. Focal moderate stenosis proximal LEFT P2 segment. Mild luminal irregularity of the bilateral posterior cerebral arteries. IMPRESSION: MRI HEAD: Equivocal findings for subcentimeter acute infarct RIGHT occipital lobe. Stable moderate to severe chronic small vessel ischemic disease and old RIGHT corona radiata/basal ganglia lacunar infarct. MRA HEAD: No acute large vessel occlusion or high-grade stenosis. 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 Chronically occluded LEFT vertebral  artery, with focal moderate to high-grade stenosis proximal basilar artery. Stable at least moderate stenosis proximal RIGHT V4 segment. Luminal irregularity of the intracranial vessels compatible with atherosclerosis. Electronically Signed   By: Elon Alas M.D.   On: 08/16/2015 23:02   [name] Head/brain Wo Cm  08/16/2015  CLINICAL DATA:  Acute onset LEFT visual field changes and LEFT facial droop at 4:30 p.m. today, now resolved. History of stroke and coronary artery disease, on anticoagulation. EXAM: MRI HEAD WITHOUT CONTRAST MRA HEAD WITHOUT CONTRAST TECHNIQUE: Multiplanar, multiecho pulse sequences of the brain and surrounding structures were obtained without intravenous contrast. Angiographic images of the head were obtained using MRA technique without contrast. COMPARISON:  CT head August 16, 2015  1951 hours and MRI/MRA head July 31, 2012 FINDINGS: MRI HEAD FINDINGS Subcentimeter focus of reduced diffusion RIGHT occipital lobe on coronal 9/ 78, not present on the axial T2 view. Punctate focus of susceptibility artifact LEFT mesial posterior frontal lobe, nonspecific. Moderate ventriculomegaly, unchanged from prior examination of base of global parenchymal brain volume loss. RIGHT insular encephalomalacia unchanged from prior examination. Prominent periventricular spaces bilateral basal ganglia with superimposed old RIGHT carina radiata/basal ganglia lacunar infarct. No midline shift, mass effect or mass lesions. Confluent supratentorial and pontine white matter T2 hyperintensities are similar. No abnormal extra-axial fluid collections. Status post bilateral ocular lens implants. Trace paranasal sinus mucosal thickening without air-fluid levels. Mastoid air cells are well aerated. No abnormal sellar expansion. No cerebellar tonsillar ectopia. No suspicious calvarial bone marrow signal. Patient is edentulous. MRA HEAD FINDINGS Anterior circulation: Internal carotid arteries are patent bilaterally.  Mildly ectatic distal LEFT petrous internal carotid artery. 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 Moderate, progressed stenosis RIGHT supraclinoid internal carotid artery. Luminal irregularity of the bilateral carotid siphons compatible with atherosclerosis. Normal appearance the anterior cerebral arteries, with patent anterior communicating artery. Normal flow related enhancement bilateral middle cerebral arteries with moderate luminal irregularity of the mid and distal segments. Posterior circulation: Chronically occluded LEFT vertebral artery, with similar at least moderate stenosis proximal RIGHT V4 segment. Retrograde flow into LEFT posterior-inferior cerebellar artery. Focal moderate to high-grade stenosis proximal basilar artery with poststenotic dolichoectasia. Patent RIGHT posterior communicating artery. Flow related enhancement within bilateral posterior cerebral arteries, patent. Focal moderate stenosis proximal LEFT P2 segment. Mild luminal irregularity of the bilateral posterior cerebral arteries. IMPRESSION: MRI HEAD: Equivocal findings for subcentimeter acute infarct RIGHT occipital lobe. Stable moderate to severe chronic small vessel ischemic disease and old RIGHT corona radiata/basal ganglia lacunar infarct. MRA HEAD: No acute large vessel occlusion or high-grade stenosis. 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 Chronically occluded LEFT vertebral artery, with focal moderate to high-grade stenosis proximal basilar artery. Stable at least moderate stenosis proximal RIGHT V4 segment. Luminal irregularity of the intracranial vessels compatible with atherosclerosis. Electronically Signed   By: Elon Alas M.D.   On: 08/16/2015 23:02   I have personally reviewed and evaluated these images and lab results as part of my medical decision-making.   EKG Interpretation None      MDM   Final  diagnoses:  Facial droop  Transient cerebral ischemia, unspecified transient cerebral ischemia type    Code stroke called. Discussed with Dr. Nicole Kindred. Agrees with plan to transfer but patient is not TPA candidate at this time due to rapidly improving symptoms. Dr. Audie Pinto in the emergency department will accept patient in transfer.  CT without evidence of acute bleed.  Julianne Rice, MD 08/16/15 2308

## 2015-08-16 NOTE — ED Notes (Signed)
Neurology at bedside.

## 2015-08-16 NOTE — ED Notes (Signed)
Patient transported to MRI 

## 2015-08-16 NOTE — ED Notes (Signed)
Pt reports change in vision around 1630 this afternoon. Family reports she has left facial droop. Hx of TIAs. Taken directly to room 11 and Dr Lita Mains at bedside to evaluate

## 2015-08-16 NOTE — H&P (Signed)
[facility] History and Physical  Gina Bowers [id number] DOB: 03-Jan-1926 DOA: 08/16/2015  Referring physician: EDP PCP: Irven Shelling, MD   Chief Complaint: L visual changes, facial droop   HPI: Gina Bowers is a 82 y.o. female with h/o CVA, TIA, CAD.  Patient presents to the ED with acute onset of L visual field changes and left facial droop.  Symptoms onset 4:30 pm today, have resolved at this point.  CT head is negative.  No weakness nor numbness of extremities.  Patient on ASA and plavix daily due to h/o CVA in past.  Review of Systems: Systems reviewed.  As above, otherwise negative  Past Medical History  Diagnosis Date  . Coronary artery disease     DR. Rollene Fare IS PT'S CARDIOLOGIST - HX CABG AND HAS STENT AND TOLD SHE HAS ANOTHER BLOCKAGE-BEING TREATED MEDICALLY  . High cholesterol   . Lung nodules     TOLD SHE HAS INTERSTITUAL LUNG DISEASE  . Shortness of breath     AND WHEEZING AT TIMES--NO INHALERS  . Vertigo   . Arthritis   . GERD (gastroesophageal reflux disease)   . Urinary incontinence   . UTI (lower urinary tract infection)     HX OF UTI'S-- LAST TIME COUPLE OF MONTHS AGO  . Hypothyroidism   . Blood transfusion 2007    AFTER HIP REPLACEMENT  . Fracture FEB 2013    FRACTURED LEFT ANKLE--NO SURGERY--WAS IN BOOT UNTIL COUPLE WEEKS AGO  . Depression   . Carotid bruit     PT'S DAUGHTER STATES PT HAD RECENT CAROTID STUDY AND WAS TOLD NO SIGNIFICANT BLOCKAGES  . [place] virus     COUPLE OF WEEKS AGO--ALL SYMTOMS RESOLVED PER PT  . Kidney stones     IN THE PAST  . Stroke ([place])     SEVERAL STROKES -TOLD SHE HAS CEREBELLUM BLOCKAGE AS RESULT OF STROKE--BUT NEUROLOGIST SAID HE DID NOT WANT TO DO ANY PROCEDURE BECAUSE OF HER AGE .  PT HAS SLIGHT LEFT FOOT DROP-DRAGS FOOT WHEN WALKING - USES WALKER  . CAD (coronary artery disease) of artery bypass graft 06/06/2014     2009 CABG 4, Dr. Servando Snare (LIMA to LAD , SVG to diagonal, SVG to circumflex, SVG  to PDA  Cath 2012 in  Recent ill,[place] patent LIMA ,  Patent SVG to diagonal occluded SVG to RCA, report 5 x 18 BMS stent to native RCA  . Ischemic colitis ([place]) 2010  . Obstructive sleep apnea 2010  . CHF (congestive heart failure) [facility])    Past Surgical History  Procedure Laterality Date  . Coronary angioplasty  10/2010    WITH STENT PLACEMENT  . Coronary artery bypass graft  2009  . Joint replacement  2007    HIP REPLACEMENT -LEFT  . Bladder tack      1969 AND 1990  . Cystoscopy with injection  09/21/2011    Procedure: CYSTOSCOPY WITH INJECTION;  Surgeon: Ailene Rud, MD;  Location: [facility];  Service: Urology;  Laterality: N/A;  Peri Urethral Macroplastique Injection and Estring Placement   Social History:  reports that she has never smoked. She has never used smokeless tobacco. She reports that she does not drink alcohol or use illicit drugs.  Allergies  Allergen Reactions  . Sulfonamide Derivatives Hives    Family History  Problem Relation Age of Onset  . Heart disease Mother      Prior to Admission medications   Medication Sig Start Date End Date Taking? Authorizing  Provider  alendronate (FOSAMAX) 70 MG tablet Take 1 tablet (70 mg total) by mouth once a week. Take with a full glass of water on an empty stomach. Patient taking differently: Take 70 mg by mouth once a week. Take with a full glass of water on an empty stomach.mon 06/06/14  Yes Mihai Croitoru, MD  aspirin EC 81 MG tablet Take 81 mg by mouth every evening.    Yes Historical Provider, MD  Calcium Carb-Cholecalciferol (CALCIUM 1000 + D PO) Take 2 tablets by mouth daily.   Yes Historical Provider, MD  cephALEXin (KEFLEX) 250 MG capsule Take 1 capsule (250 mg total) by mouth daily. Patient taking differently: Take 250 mg by mouth daily. See 06/06/14  Yes Mihai Croitoru, MD  clopidogrel (PLAVIX) 75 MG tablet Take 1 tablet (75 mg total) by mouth daily. 06/06/14  Yes Mihai Croitoru, MD  CRANBERRY EXTRACT PO Take  500 mg by mouth daily.   Yes Historical Provider, MD  fish oil-omega-3 fatty acids 1000 MG capsule Take 1 g by mouth daily with breakfast.   Yes Historical Provider, MD  levothyroxine (SYNTHROID, LEVOTHROID) 75 MCG tablet Take 1 tablet (75 mcg total) by mouth daily before breakfast. 06/06/14  Yes Mihai Croitoru, MD  lisinopril (PRINIVIL,ZESTRIL) 5 MG tablet Take 1 tablet (5 mg total) by mouth daily. 07/24/15  Yes Mihai Croitoru, MD  meclizine (ANTIVERT) 25 MG tablet Take 25 mg by mouth 3 (three) times daily as needed for dizziness.   Yes Historical Provider, MD  mirabegron ER (MYRBETRIQ) 50 MG TB24 tablet Take 1 tablet (50 mg total) by mouth 2 (two) times daily. 06/06/14  Yes Mihai Croitoru, MD  Multiple Vitamin (MULITIVITAMIN WITH MINERALS) TABS Take 1 tablet by mouth daily with breakfast.   Yes Historical Provider, MD  ondansetron (ZOFRAN ODT) 4 MG disintegrating tablet Take one every 6 hours for nausea 06/06/14  Yes Mihai Croitoru, MD  OVER THE COUNTER MEDICATION Take 2 capsules by mouth daily. Immuplex Immune Booster. Takes during the winter months   Yes Historical Provider, MD  pantoprazole (PROTONIX) 40 MG tablet Take 1 tablet (40 mg total) by mouth daily. 06/04/15  Yes Mihai Croitoru, MD  Polyethyl Glycol-Propyl Glycol (SYSTANE OP) Place 1 drop into both eyes 3 (three) times daily. Patient states its the Gel form. Not sure what percentage. Take every day per patient   Yes Historical Provider, MD  rosuvastatin (CRESTOR) 20 MG tablet TAKE 1 TABLET (20 MG TOTAL) BY MOUTH EVERY EVENING. 06/25/15  Yes Mihai Croitoru, MD  sertraline (ZOLOFT) 50 MG tablet 1.5 tablets by mouth every day 06/06/14  Yes Sanda Klein, MD   Physical Exam: Filed Vitals:   08/16/15 2000 08/16/15 2057  BP: 148/78 153/77  Pulse: 72 75  Temp:    Resp: 19 14    BP 153/77 mmHg  Pulse 75  Temp(Src) 98.1 F (36.7 C) (Oral)  Resp 14  Ht 5\' 1"  (1.549 m)  Wt 77.111 kg (170 lb)  BMI 32.14 kg/m2  SpO2 93%  General Appearance:     Alert, oriented, no distress, appears stated age  Head:    Normocephalic, atraumatic  Eyes:    PERRL, EOMI, sclera non-icteric        Nose:   Nares without drainage or epistaxis. Mucosa, turbinates normal  Throat:   Moist mucous membranes. Oropharynx without erythema or exudate.  Neck:   Supple. No carotid bruits.  No thyromegaly.  No lymphadenopathy.   Back:     No CVA tenderness, no  spinal tenderness  Lungs:     Clear to auscultation bilaterally, without wheezes, rhonchi or rales  Chest wall:    No tenderness to palpitation  Heart:    Regular rate and rhythm without murmurs, gallops, rubs  Abdomen:     Soft, non-tender, nondistended, normal bowel sounds, no organomegaly  Genitalia:    deferred  Rectal:    deferred  Extremities:   No clubbing, cyanosis or edema.  Pulses:   2+ and symmetric all extremities  Skin:   Skin color, texture, turgor normal, no rashes or lesions  Lymph nodes:   Cervical, supraclavicular, and axillary nodes normal  Neurologic:   CNII-XII intact. Normal strength, sensation and reflexes      throughout    Labs on Admission:  Basic Metabolic Panel:  Recent Labs Lab 08/16/15 1940  NA 132*  K 4.3  CL 96*  CO2 28  GLUCOSE 102*  BUN 16  CREATININE 0.44  CALCIUM 9.1   Liver Function Tests:  Recent Labs Lab 08/16/15 1940  AST 20  ALT 18  ALKPHOS 57  BILITOT 0.3  PROT 7.0  ALBUMIN 3.8   No results for input(s): LIPASE, AMYLASE in the last 168 hours. No results for input(s): AMMONIA in the last 168 hours. CBC:  Recent Labs Lab 08/16/15 1940  WBC 9.7  NEUTROABS 5.2  HGB 12.1  HCT 36.9  MCV 94.4  PLT 235   Cardiac Enzymes:  Recent Labs Lab 08/16/15 1940  TROPONINI <0.03    BNP (last 3 results) No results for input(s): PROBNP in the last 8760 hours. CBG:  Recent Labs Lab 08/16/15 1946  GLUCAP 96    Radiological Exams on Admission: Ct Head Wo Contrast  08/16/2015  CLINICAL DATA:  Vision changes at 16:30 this afternoon.  Left facial droop. Code stroke. EXAM: CT HEAD WITHOUT CONTRAST TECHNIQUE: Contiguous axial images were obtained from the base of the skull through the vertex without intravenous contrast. COMPARISON:  CT head 03/11/2013.  MRI brain 07/31/2012. FINDINGS: Diffuse cerebral atrophy. Ventricular dilatation consistent with central atrophy. Low-attenuation changes throughout the deep white matter bilaterally is nonspecific but likely represent small vessel ischemic change. Slight asymmetry of white matter changes to the right. Can't exclude early ischemia. Old lacunar infarcts. No mass effect or midline shift. No abnormal extra-axial fluid collections. Gray-white matter junctions are distinct. Basal cisterns are not effaced. No evidence of acute intracranial hemorrhage. No depressed skull fractures. Visualized paranasal sinuses and mastoid air cells are not opacified. Vascular calcifications. IMPRESSION: Chronic atrophy and small vessel ischemic changes. Slight asymmetry of white matter changes on the right. Can't exclude acute ischemia. No mass effect or midline shift. No acute intracranial hemorrhage. These results were called by telephone at the time of interpretation on 08/16/2015 at 7:59 pm to Dr. Julianne Rice , who verbally acknowledged these results. Electronically Signed   By: Lucienne Capers M.D.   On: 08/16/2015 20:01    EKG: Independently reviewed.  Assessment/Plan Principal Problem:   TIA (transient ischemic attack) Active Problems:   Possible Renovascular hypertension   Hyperlipidemia   1. TIA - 1. Stroke pathway 2. MRI/MRA 3. 2d echo 4. Carotid dopplers 5. PT/OT/SLP 6. Tele monitor 7. Continue asa and plavix 2. HTN - continue home meds 3. HLD - continue statin  Neurology note in chart.  Code Status: Full  Family Communication: Family at bedside Disposition Plan: Admit to obs   Time spent: 50 min  GARDNER, JARED M. [facility] Pager [phone]  If 7AM-7PM,  please  contact the day team taking care of the patient [email or web address] Password [email or web address] 08/16/2015, 10:04 PM

## 2015-08-16 NOTE — ED Provider Notes (Signed)
CSN: [id number]     Arrival date & time 08/16/15  1915 History   First MD Initiated Contact with Patient 08/16/15 1930     Chief Complaint  Patient presents with  . Code Stroke     (Consider location/radiation/quality/duration/timing/severity/associated sxs/prior Treatment) HPI   Patient is an 82 year old female with history of coronary artery disease, stroke, TIA on Plavix and aspirin presenting today with stroke like symptoms. Patient seen at [facility] [place]. Had code stroke called, discussed with Dr. Nicole Kindred and then patient was transferred here. Patient's symptoms have resolved.  Patient feels back to baseline.    Past Medical History  Diagnosis Date  . Coronary artery disease     DR. Rollene Fare IS PT'S CARDIOLOGIST - HX CABG AND HAS STENT AND TOLD SHE HAS ANOTHER BLOCKAGE-BEING TREATED MEDICALLY  . High cholesterol   . Lung nodules     TOLD SHE HAS INTERSTITUAL LUNG DISEASE  . Shortness of breath     AND WHEEZING AT TIMES--NO INHALERS  . Vertigo   . Arthritis   . GERD (gastroesophageal reflux disease)   . Urinary incontinence   . UTI (lower urinary tract infection)     HX OF UTI'S-- LAST TIME COUPLE OF MONTHS AGO  . Hypothyroidism   . Blood transfusion 2007    AFTER HIP REPLACEMENT  . Fracture FEB 2013    FRACTURED LEFT ANKLE--NO SURGERY--WAS IN BOOT UNTIL COUPLE WEEKS AGO  . Depression   . Carotid bruit     PT'S DAUGHTER STATES PT HAD RECENT CAROTID STUDY AND WAS TOLD NO SIGNIFICANT BLOCKAGES  . [place] virus     COUPLE OF WEEKS AGO--ALL SYMTOMS RESOLVED PER PT  . Kidney stones     IN THE PAST  . Stroke ([place])     SEVERAL STROKES -TOLD SHE HAS CEREBELLUM BLOCKAGE AS RESULT OF STROKE--BUT NEUROLOGIST SAID HE DID NOT WANT TO DO ANY PROCEDURE BECAUSE OF HER AGE .  PT HAS SLIGHT LEFT FOOT DROP-DRAGS FOOT WHEN WALKING - USES WALKER  . CAD (coronary artery disease) of artery bypass graft 06/06/2014     2009 CABG 4, Dr. Servando Snare (LIMA to LAD , SVG to diagonal, SVG to  circumflex, SVG to PDA  Cath 2012 in  Recent ill,[place] patent LIMA ,  Patent SVG to diagonal occluded SVG to RCA, report 5 x 18 BMS stent to native RCA  . Ischemic colitis ([place]) 2010  . Obstructive sleep apnea 2010  . CHF (congestive heart failure) [facility])    Past Surgical History  Procedure Laterality Date  . Coronary angioplasty  10/2010    WITH STENT PLACEMENT  . Coronary artery bypass graft  2009  . Joint replacement  2007    HIP REPLACEMENT -LEFT  . Bladder tack      1969 AND 1990  . Cystoscopy with injection  09/21/2011    Procedure: CYSTOSCOPY WITH INJECTION;  Surgeon: Ailene Rud, MD;  Location: [facility];  Service: Urology;  Laterality: N/A;  Peri Urethral Macroplastique Injection and Estring Placement   Family History  Problem Relation Age of Onset  . Heart disease Mother    Social History  Substance Use Topics  . Smoking status: Never Smoker   . Smokeless tobacco: Never Used  . Alcohol Use: No   OB History    No data available     Review of Systems  Constitutional: Negative for activity change.  Respiratory: Negative for shortness of breath.   Cardiovascular: Negative for chest pain.  Gastrointestinal:  Negative for abdominal pain.  Musculoskeletal: Negative for back pain.  Neurological: Negative for dizziness and light-headedness.  Psychiatric/Behavioral: Negative for agitation.      Allergies  Sulfonamide derivatives  Home Medications   Prior to Admission medications   Medication Sig Start Date End Date Taking? Authorizing Provider  alendronate (FOSAMAX) 70 MG tablet Take 1 tablet (70 mg total) by mouth once a week. Take with a full glass of water on an empty stomach. 06/06/14  Yes Mihai Croitoru, MD  aspirin EC 81 MG tablet Take 81 mg by mouth daily with breakfast.   Yes Historical Provider, MD  Calcium Carb-Cholecalciferol (CALCIUM 1000 + D PO) Take 2 tablets by mouth daily.   Yes Historical Provider, MD  cephALEXin (KEFLEX) 250 MG capsule Take  1 capsule (250 mg total) by mouth daily. 06/06/14  Yes Mihai Croitoru, MD  clopidogrel (PLAVIX) 75 MG tablet Take 1 tablet (75 mg total) by mouth daily. 06/06/14  Yes Mihai Croitoru, MD  CRANBERRY EXTRACT PO Take 500 mg by mouth daily.   Yes Historical Provider, MD  levothyroxine (SYNTHROID, LEVOTHROID) 75 MCG tablet Take 1 tablet (75 mcg total) by mouth daily before breakfast. 06/06/14  Yes Mihai Croitoru, MD  lisinopril (PRINIVIL,ZESTRIL) 5 MG tablet Take 1 tablet (5 mg total) by mouth daily. 07/24/15  Yes Mihai Croitoru, MD  meclizine (ANTIVERT) 25 MG tablet Take 25 mg by mouth 3 (three) times daily as needed for dizziness.   Yes Historical Provider, MD  mirabegron ER (MYRBETRIQ) 50 MG TB24 tablet Take 1 tablet (50 mg total) by mouth 2 (two) times daily. 06/06/14  Yes Mihai Croitoru, MD  Multiple Vitamin (MULITIVITAMIN WITH MINERALS) TABS Take 1 tablet by mouth daily with breakfast.   Yes Historical Provider, MD  ondansetron (ZOFRAN ODT) 4 MG disintegrating tablet Take one every 6 hours for nausea 06/06/14  Yes Mihai Croitoru, MD  OVER THE COUNTER MEDICATION Take 2 capsules by mouth daily. Immuplex Immune Booster. Takes during the winter months   Yes Historical Provider, MD  pantoprazole (PROTONIX) 40 MG tablet Take 1 tablet (40 mg total) by mouth daily. 06/04/15  Yes Mihai Croitoru, MD  rosuvastatin (CRESTOR) 20 MG tablet TAKE 1 TABLET (20 MG TOTAL) BY MOUTH EVERY EVENING. 06/25/15  Yes Mihai Croitoru, MD  sertraline (ZOLOFT) 50 MG tablet 1.5 tablets by mouth every day 06/06/14  Yes Mihai Croitoru, MD  fish oil-omega-3 fatty acids 1000 MG capsule Take 1 g by mouth daily with breakfast.    Historical Provider, MD   BP 153/77 mmHg  Pulse 75  Temp(Src) 98.1 F (36.7 C) (Oral)  Resp 14  Ht 5\' 1"  (1.549 m)  Wt 170 lb (77.111 kg)  BMI 32.14 kg/m2  SpO2 93% Physical Exam  Constitutional: She is oriented to person, place, and time. She appears well-developed and well-nourished.  HENT:  Head:  Normocephalic and atraumatic.  Eyes: Conjunctivae are normal. Right eye exhibits no discharge.  Evidence of cataract surgery. Patient has good vision bilateral eyes.  Neck: Neck supple.  Cardiovascular: Normal rate, regular rhythm and normal heart sounds.   No murmur heard. Pulmonary/Chest: Effort normal and breath sounds normal. She has no wheezes. She has no rales.  Abdominal: Soft. She exhibits no distension. There is no tenderness.  Musculoskeletal: Normal range of motion. She exhibits no edema.  Neurological: She is oriented to person, place, and time. No cranial nerve deficit.  No pronator drift Equal strength bilateral upper and lower extremities. No evidence of facial droop or cranial  nerve involvement currently.  Skin: Skin is warm and dry. No rash noted. She is not diaphoretic.  Psychiatric: She has a normal mood and affect.  Nursing note and vitals reviewed.   ED Course  Procedures (including critical care time) Labs Review Labs Reviewed  COMPREHENSIVE METABOLIC PANEL - Abnormal; Notable for the following:    Sodium 132 (*)    Chloride 96 (*)    Glucose, Bld 102 (*)    All other components within normal limits  ETHANOL  PROTIME-INR  APTT  CBC  DIFFERENTIAL  TROPONIN I  URINE RAPID DRUG SCREEN, HOSP PERFORMED  URINALYSIS, ROUTINE W REFLEX MICROSCOPIC (NOT AT [facility])  CBG MONITORING, ED    Imaging Review Ct Head Wo Contrast  08/16/2015  CLINICAL DATA:  Vision changes at 16:30 this afternoon. Left facial droop. Code stroke. EXAM: CT HEAD WITHOUT CONTRAST TECHNIQUE: Contiguous axial images were obtained from the base of the skull through the vertex without intravenous contrast. COMPARISON:  CT head 03/11/2013.  MRI brain 07/31/2012. FINDINGS: Diffuse cerebral atrophy. Ventricular dilatation consistent with central atrophy. Low-attenuation changes throughout the deep white matter bilaterally is nonspecific but likely represent small vessel ischemic change. Slight asymmetry  of white matter changes to the right. Can't exclude early ischemia. Old lacunar infarcts. No mass effect or midline shift. No abnormal extra-axial fluid collections. Gray-white matter junctions are distinct. Basal cisterns are not effaced. No evidence of acute intracranial hemorrhage. No depressed skull fractures. Visualized paranasal sinuses and mastoid air cells are not opacified. Vascular calcifications. IMPRESSION: Chronic atrophy and small vessel ischemic changes. Slight asymmetry of white matter changes on the right. Can't exclude acute ischemia. No mass effect or midline shift. No acute intracranial hemorrhage. These results were called by telephone at the time of interpretation on 08/16/2015 at 7:59 pm to Dr. Julianne Rice , who verbally acknowledged these results. Electronically Signed   By: Lucienne Capers M.D.   On: 08/16/2015 20:01   I have personally reviewed and evaluated these images and lab results as part of my medical decision-making.   EKG Interpretation None      MDM   Final diagnoses:  Facial droop    Patient is an 82 year old female with history of stroke, TIA, coronary artery disease on Plavix and aspirin presenting today with strokelike symptoms. These have resolved. Patient symptoms were left visual deficit and left facial weakness. Code stroke was called from [place].  Patient transferred here to emergency room. Patient appears in stable condition.  Will admit for TIA workup.  Lendora Keys Julio Alm, MD 08/16/15 2119

## 2015-08-16 NOTE — ED Notes (Signed)
Called MRI, pt exam complete

## 2015-08-17 ENCOUNTER — Observation Stay (HOSPITAL_COMMUNITY): Payer: Medicare Other

## 2015-08-17 DIAGNOSIS — F329 Major depressive disorder, single episode, unspecified: Secondary | ICD-10-CM | POA: Diagnosis present

## 2015-08-17 DIAGNOSIS — Z951 Presence of aortocoronary bypass graft: Secondary | ICD-10-CM | POA: Diagnosis not present

## 2015-08-17 DIAGNOSIS — I2510 Atherosclerotic heart disease of native coronary artery without angina pectoris: Secondary | ICD-10-CM | POA: Diagnosis present

## 2015-08-17 DIAGNOSIS — Z8673 Personal history of transient ischemic attack (TIA), and cerebral infarction without residual deficits: Secondary | ICD-10-CM | POA: Diagnosis not present

## 2015-08-17 DIAGNOSIS — I10 Essential (primary) hypertension: Secondary | ICD-10-CM | POA: Diagnosis present

## 2015-08-17 DIAGNOSIS — Z7983 Long term (current) use of bisphosphonates: Secondary | ICD-10-CM | POA: Diagnosis not present

## 2015-08-17 DIAGNOSIS — R29810 Facial weakness: Secondary | ICD-10-CM | POA: Diagnosis present

## 2015-08-17 DIAGNOSIS — G458 Other transient cerebral ischemic attacks and related syndromes: Secondary | ICD-10-CM

## 2015-08-17 DIAGNOSIS — Z79899 Other long term (current) drug therapy: Secondary | ICD-10-CM | POA: Diagnosis not present

## 2015-08-17 DIAGNOSIS — I6312 Cerebral infarction due to embolism of basilar artery: Secondary | ICD-10-CM | POA: Diagnosis present

## 2015-08-17 DIAGNOSIS — I150 Renovascular hypertension: Secondary | ICD-10-CM | POA: Diagnosis not present

## 2015-08-17 DIAGNOSIS — Z955 Presence of coronary angioplasty implant and graft: Secondary | ICD-10-CM | POA: Diagnosis not present

## 2015-08-17 DIAGNOSIS — G459 Transient cerebral ischemic attack, unspecified: Secondary | ICD-10-CM | POA: Diagnosis not present

## 2015-08-17 DIAGNOSIS — H5340 Unspecified visual field defects: Secondary | ICD-10-CM | POA: Diagnosis present

## 2015-08-17 DIAGNOSIS — Z7982 Long term (current) use of aspirin: Secondary | ICD-10-CM | POA: Diagnosis not present

## 2015-08-17 DIAGNOSIS — Z96642 Presence of left artificial hip joint: Secondary | ICD-10-CM | POA: Diagnosis present

## 2015-08-17 DIAGNOSIS — I639 Cerebral infarction, unspecified: Secondary | ICD-10-CM | POA: Diagnosis present

## 2015-08-17 DIAGNOSIS — I671 Cerebral aneurysm, nonruptured: Secondary | ICD-10-CM | POA: Diagnosis present

## 2015-08-17 DIAGNOSIS — G4733 Obstructive sleep apnea (adult) (pediatric): Secondary | ICD-10-CM | POA: Diagnosis present

## 2015-08-17 DIAGNOSIS — Z7902 Long term (current) use of antithrombotics/antiplatelets: Secondary | ICD-10-CM | POA: Diagnosis not present

## 2015-08-17 DIAGNOSIS — E039 Hypothyroidism, unspecified: Secondary | ICD-10-CM | POA: Diagnosis present

## 2015-08-17 DIAGNOSIS — K219 Gastro-esophageal reflux disease without esophagitis: Secondary | ICD-10-CM | POA: Diagnosis present

## 2015-08-17 DIAGNOSIS — E785 Hyperlipidemia, unspecified: Secondary | ICD-10-CM | POA: Diagnosis not present

## 2015-08-17 LAB — BASIC METABOLIC PANEL
Anion gap: 9 (ref 5–15)
BUN: 10 mg/dL (ref 6–20)
CALCIUM: 8.7 mg/dL — AB (ref 8.9–10.3)
CO2: 26 mmol/L (ref 22–32)
CREATININE: 0.44 mg/dL (ref 0.44–1.00)
Chloride: 100 mmol/L — ABNORMAL LOW (ref 101–111)
GFR calc Af Amer: >60 mL/min (ref >60)
GLUCOSE: 88 mg/dL (ref 65–99)
POTASSIUM: 4.3 mmol/L (ref 3.5–5.1)
Sodium: 135 mmol/L (ref 135–145)

## 2015-08-17 LAB — LIPID PANEL
CHOL/HDL RATIO: 2.5 ratio
Cholesterol: 147 mg/dL (ref 0–200)
HDL: 58 mg/dL (ref >40)
LDL CALC: 75 mg/dL (ref 0–99)
Triglycerides: 70 mg/dL (ref <150)
VLDL: 14 mg/dL (ref 0–40)

## 2015-08-17 LAB — MRSA PCR SCREENING: MRSA BY PCR: NEGATIVE

## 2015-08-17 MED ORDER — SODIUM CHLORIDE 0.9 % IV SOLN
INTRAVENOUS | Status: DC
  Administered 2015-08-17: 11:00:00 via INTRAVENOUS

## 2015-08-17 NOTE — Progress Notes (Signed)
Pt arrived on unit 2300hrs, A&O, no obvious distress, was able to ambulate with assist to bed, NIHSS-0, PT failed Stroke swallow screen due to self reporting of intermittent dysphagia. MRSA swab Negative, pt oriented to room and equipment admission orders implemented.

## 2015-08-17 NOTE — Progress Notes (Signed)
[facility] PROGRESS NOTE  Gina Bowers [id number] DOB: 1925-12-20 DOA: 08/16/2015 PCP: Irven Shelling, MD  Assessment/Plan:  1. TIA - still w/o sx. Likely d/c in AM. 1. Stroke pathway 2. MRI/MRA 3. 2d echo 4. Carotid dopplers 5. PT/OT/SLP 6. Tele monitor 7. Continue asa and plavix 2. HTN - continue home meds. Will cont to monitor. 3. HLD - continue pt's statin  Code Status: full Family Communication: none (indicate person spoken with, relationship, and if by phone, the number) Disposition Plan: home   Consultants:  neuro  Procedures:  none  Antibiotics:  none (indicate start date, and stop date if known)  HPI/Subjective: Patient states that she is still asymptomatic. No recurrence of her facial droop or loss of vision. Patient states that her vision is at baseline. Denies headache, nausea, vomiting, abdominal pain, leg pain, chest pain.  Objective: Filed Vitals:   08/17/15 1400 08/17/15 1421  BP: 150/64 137/52  Pulse: 66 72  Temp: 97.9 F (36.6 C) 98.5 F (36.9 C)  Resp: 12 15    Intake/Output Summary (Last 24 hours) at 08/17/15 1636 Last data filed at 08/17/15 1200  Gross per 24 hour  Intake    960 ml  Output      0 ml  Net    960 ml   Filed Weights   08/16/15 1927  Weight: 77.111 kg (170 lb)    Exam:   General:  No diaphoresis, anxious, no acute distress  Cardiovascular: Regular rate and rhythm no murmurs rubs or gallops  Respiratory: Clear to auscultation bilaterally no more breathing  Abdomen: Nondistended bowel sounds normal nontender palpation  Musculoskeletal: Moving all extremities, no deformity, 5 out of 5 strength    Data Reviewed: Basic Metabolic Panel:  Recent Labs Lab 08/16/15 1940 08/17/15 0936  NA 132* 135  K 4.3 4.3  CL 96* 100*  CO2 28 26  GLUCOSE 102* 88  BUN 16 10  CREATININE 0.44 0.44  CALCIUM 9.1 8.7*   Liver Function Tests:  Recent Labs Lab 08/16/15 1940  AST 20  ALT 18  ALKPHOS 57   BILITOT 0.3  PROT 7.0  ALBUMIN 3.8   No results for input(s): LIPASE, AMYLASE in the last 168 hours. No results for input(s): AMMONIA in the last 168 hours. CBC:  Recent Labs Lab 08/16/15 1940  WBC 9.7  NEUTROABS 5.2  HGB 12.1  HCT 36.9  MCV 94.4  PLT 235   Cardiac Enzymes:  Recent Labs Lab 08/16/15 1940  TROPONINI <0.03   BNP (last 3 results) No results for input(s): BNP in the last 8760 hours.  ProBNP (last 3 results) No results for input(s): PROBNP in the last 8760 hours.  CBG:  Recent Labs Lab 08/16/15 1946  GLUCAP 96    Recent Results (from the past 240 hour(s))  MRSA PCR Screening     Status: None   Collection Time: 08/16/15 11:27 PM  Result Value Ref Range Status   MRSA by PCR NEGATIVE NEGATIVE Final    Comment:        The GeneXpert MRSA Assay (FDA approved for NASAL specimens only), is one component of a comprehensive MRSA colonization surveillance program. It is not intended to diagnose MRSA infection nor to guide or monitor treatment for MRSA infections.      Studies: Ct Head Wo Contrast  08/16/2015  CLINICAL DATA:  Vision changes at 16:30 this afternoon. Left facial droop. Code stroke. EXAM: CT HEAD WITHOUT CONTRAST TECHNIQUE: Contiguous axial images were obtained from  the base of the skull through the vertex without intravenous contrast. COMPARISON:  CT head 03/11/2013.  MRI brain 07/31/2012. FINDINGS: Diffuse cerebral atrophy. Ventricular dilatation consistent with central atrophy. Low-attenuation changes throughout the deep white matter bilaterally is nonspecific but likely represent small vessel ischemic change. Slight asymmetry of white matter changes to the right. Can't exclude early ischemia. Old lacunar infarcts. No mass effect or midline shift. No abnormal extra-axial fluid collections. Gray-white matter junctions are distinct. Basal cisterns are not effaced. No evidence of acute intracranial hemorrhage. No depressed skull fractures.  Visualized paranasal sinuses and mastoid air cells are not opacified. Vascular calcifications. IMPRESSION: Chronic atrophy and small vessel ischemic changes. Slight asymmetry of white matter changes on the right. Can't exclude acute ischemia. No mass effect or midline shift. No acute intracranial hemorrhage. These results were called by telephone at the time of interpretation on 08/16/2015 at 7:59 pm to Dr. Julianne Rice , who verbally acknowledged these results. Electronically Signed   By: Lucienne Capers M.D.   On: 08/16/2015 20:01   Mr Brain Wo Contrast  08/16/2015  CLINICAL DATA:  Acute onset LEFT visual field changes and LEFT facial droop at 4:30 p.m. today, now resolved. History of stroke and coronary artery disease, on anticoagulation. EXAM: MRI HEAD WITHOUT CONTRAST MRA HEAD WITHOUT CONTRAST TECHNIQUE: Multiplanar, multiecho pulse sequences of the brain and surrounding structures were obtained without intravenous contrast. Angiographic images of the head were obtained using MRA technique without contrast. COMPARISON:  CT head August 16, 2015 1951 hours and MRI/MRA head July 31, 2012 FINDINGS: MRI HEAD FINDINGS Subcentimeter focus of reduced diffusion RIGHT occipital lobe on coronal 9/ 78, not present on the axial T2 view. Punctate focus of susceptibility artifact LEFT mesial posterior frontal lobe, nonspecific. Moderate ventriculomegaly, unchanged from prior examination of base of global parenchymal brain volume loss. RIGHT insular encephalomalacia unchanged from prior examination. Prominent periventricular spaces bilateral basal ganglia with superimposed old RIGHT carina radiata/basal ganglia lacunar infarct. No midline shift, mass effect or mass lesions. Confluent supratentorial and pontine white matter T2 hyperintensities are similar. No abnormal extra-axial fluid collections. Status post bilateral ocular lens implants. Trace paranasal sinus mucosal thickening without air-fluid levels. Mastoid air  cells are well aerated. No abnormal sellar expansion. No cerebellar tonsillar ectopia. No suspicious calvarial bone marrow signal. Patient is edentulous. MRA HEAD FINDINGS Anterior circulation: Internal carotid arteries are patent bilaterally. Mildly ectatic distal LEFT petrous internal carotid artery. 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 Moderate, progressed stenosis RIGHT supraclinoid internal carotid artery. Luminal irregularity of the bilateral carotid siphons compatible with atherosclerosis. Normal appearance the anterior cerebral arteries, with patent anterior communicating artery. Normal flow related enhancement bilateral middle cerebral arteries with moderate luminal irregularity of the mid and distal segments. Posterior circulation: Chronically occluded LEFT vertebral artery, with similar at least moderate stenosis proximal RIGHT V4 segment. Retrograde flow into LEFT posterior-inferior cerebellar artery. Focal moderate to high-grade stenosis proximal basilar artery with poststenotic dolichoectasia. Patent RIGHT posterior communicating artery. Flow related enhancement within bilateral posterior cerebral arteries, patent. Focal moderate stenosis proximal LEFT P2 segment. Mild luminal irregularity of the bilateral posterior cerebral arteries. IMPRESSION: MRI HEAD: Equivocal findings for subcentimeter acute infarct RIGHT occipital lobe. Stable moderate to severe chronic small vessel ischemic disease and old RIGHT corona radiata/basal ganglia lacunar infarct. MRA HEAD: No acute large vessel occlusion or high-grade stenosis. 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 Chronically occluded LEFT vertebral artery, with  focal moderate to high-grade stenosis proximal basilar artery. Stable at least moderate stenosis proximal RIGHT V4 segment. Luminal irregularity of the intracranial vessels compatible with atherosclerosis.  Electronically Signed   By: Elon Alas M.D.   On: 08/16/2015 23:02   [name] Head/brain Wo Cm  08/16/2015  CLINICAL DATA:  Acute onset LEFT visual field changes and LEFT facial droop at 4:30 p.m. today, now resolved. History of stroke and coronary artery disease, on anticoagulation. EXAM: MRI HEAD WITHOUT CONTRAST MRA HEAD WITHOUT CONTRAST TECHNIQUE: Multiplanar, multiecho pulse sequences of the brain and surrounding structures were obtained without intravenous contrast. Angiographic images of the head were obtained using MRA technique without contrast. COMPARISON:  CT head August 16, 2015 1951 hours and MRI/MRA head July 31, 2012 FINDINGS: MRI HEAD FINDINGS Subcentimeter focus of reduced diffusion RIGHT occipital lobe on coronal 9/ 78, not present on the axial T2 view. Punctate focus of susceptibility artifact LEFT mesial posterior frontal lobe, nonspecific. Moderate ventriculomegaly, unchanged from prior examination of base of global parenchymal brain volume loss. RIGHT insular encephalomalacia unchanged from prior examination. Prominent periventricular spaces bilateral basal ganglia with superimposed old RIGHT carina radiata/basal ganglia lacunar infarct. No midline shift, mass effect or mass lesions. Confluent supratentorial and pontine white matter T2 hyperintensities are similar. No abnormal extra-axial fluid collections. Status post bilateral ocular lens implants. Trace paranasal sinus mucosal thickening without air-fluid levels. Mastoid air cells are well aerated. No abnormal sellar expansion. No cerebellar tonsillar ectopia. No suspicious calvarial bone marrow signal. Patient is edentulous. MRA HEAD FINDINGS Anterior circulation: Internal carotid arteries are patent bilaterally. Mildly ectatic distal LEFT petrous internal carotid artery. 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 Moderate, progressed stenosis RIGHT supraclinoid internal carotid  artery. Luminal irregularity of the bilateral carotid siphons compatible with atherosclerosis. Normal appearance the anterior cerebral arteries, with patent anterior communicating artery. Normal flow related enhancement bilateral middle cerebral arteries with moderate luminal irregularity of the mid and distal segments. Posterior circulation: Chronically occluded LEFT vertebral artery, with similar at least moderate stenosis proximal RIGHT V4 segment. Retrograde flow into LEFT posterior-inferior cerebellar artery. Focal moderate to high-grade stenosis proximal basilar artery with poststenotic dolichoectasia. Patent RIGHT posterior communicating artery. Flow related enhancement within bilateral posterior cerebral arteries, patent. Focal moderate stenosis proximal LEFT P2 segment. Mild luminal irregularity of the bilateral posterior cerebral arteries. IMPRESSION: MRI HEAD: Equivocal findings for subcentimeter acute infarct RIGHT occipital lobe. Stable moderate to severe chronic small vessel ischemic disease and old RIGHT corona radiata/basal ganglia lacunar infarct. MRA HEAD: No acute large vessel occlusion or high-grade stenosis. 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 Chronically occluded LEFT vertebral artery, with focal moderate to high-grade stenosis proximal basilar artery. Stable at least moderate stenosis proximal RIGHT V4 segment. Luminal irregularity of the intracranial vessels compatible with atherosclerosis. Electronically Signed   By: Elon Alas M.D.   On: 08/16/2015 23:02    Scheduled Meds: . antiseptic oral rinse  7 mL Mouth Rinse BID  . aspirin EC  81 mg Oral QPM  . clopidogrel  75 mg Oral Daily  . enoxaparin (LOVENOX) injection  40 mg Subcutaneous Q24H  . levothyroxine  75 mcg Oral QAC breakfast  . lisinopril  5 mg Oral Daily  . mirabegron ER  50 mg Oral BID  . pantoprazole  40 mg Oral Daily  . polyvinyl alcohol  2 drop Both Eyes TID  .  rosuvastatin  20 mg Oral q1800  .  sertraline  75 mg Oral Daily   Continuous Infusions: . sodium chloride 125 mL/hr at 08/17/15 1128    Principal Problem:   TIA (transient ischemic attack) Active Problems:   Possible Renovascular hypertension   Hyperlipidemia    Time spent: [facility] Pager [phone].  If 7PM-7AM, please contact night-coverage at [email or web address], password [facility] 08/17/2015, 4:36 PM

## 2015-08-17 NOTE — Progress Notes (Signed)
STROKE TEAM PROGRESS NOTE   HISTORY OF PRESENT ILLNESS at admission Gina Bowers is an 82 y.o. female with a history of previous stroke and TIAs, hypertension, hyperlipidemia and coronary artery disease, presenting with acute onset of left visual field changes and left facial droop. She described visual changes as blurred vision with ink colored lights involving the upper half of her vision on the left side. Onset was at 4:30 PM today. CT scan of her head showed no acute intracranial abnormality. Deficits subsequently resolved. Patient has been taking aspirin and Plavix daily. She had no speech changes nor weakness and numbness involving left extremities.  LSN: 4:30 PM on 08/16/2015 tPA Given: No: Deficits resolved mRankin:   SUBJECTIVE (INTERVAL HISTORY) Her family is at the bedside.  Overall she feels her condition is rapidly improving. Patient described having a film over the eye last week which is now "gone"  The event was brief   OBJECTIVE Temp:  [97.9 F (36.6 C)-98.5 F (36.9 C)] 98.5 F (36.9 C) (03/25 1421) Pulse Rate:  [65-75] 72 (03/25 1421) Cardiac Rhythm:  [-] Normal sinus rhythm (03/25 1400) Resp:  [12-22] 15 (03/25 1421) BP: (132-172)/(52-85) 137/52 mmHg (03/25 1421) SpO2:  [91 %-98 %] 97 % (03/25 1421) Weight:  [77.111 kg (170 lb)] 77.111 kg (170 lb) (03/24 1927)  CBC:   Recent Labs Lab 08/16/15 1940  WBC 9.7  NEUTROABS 5.2  HGB 12.1  HCT 36.9  MCV 94.4  PLT [id number]    Basic Metabolic Panel:   Recent Labs Lab 08/16/15 1940 08/17/15 0936  NA 132* 135  K 4.3 4.3  CL 96* 100*  CO2 28 26  GLUCOSE 102* 88  BUN 16 10  CREATININE 0.44 0.44  CALCIUM 9.1 8.7*    Lipid Panel:     Component Value Date/Time   CHOL 147 08/17/2015 0648   TRIG 70 08/17/2015 0648   HDL 58 08/17/2015 0648   CHOLHDL 2.5 08/17/2015 0648   VLDL 14 08/17/2015 0648   LDLCALC 75 08/17/2015 0648   HgbA1c:  Lab Results  Component Value Date   HGBA1C 5.7* 11/13/2011   Urine  Drug Screen:     Component Value Date/Time   LABOPIA NONE DETECTED 08/16/2015 2216   COCAINSCRNUR NONE DETECTED 08/16/2015 2216   LABBENZ NONE DETECTED 08/16/2015 2216   AMPHETMU NONE DETECTED 08/16/2015 2216   THCU NONE DETECTED 08/16/2015 2216   LABBARB NONE DETECTED 08/16/2015 2216      IMAGING  Ct Head Wo Contrast 08/16/2015   Chronic atrophy and small vessel ischemic changes. Slight asymmetry of white matter changes on the right. Can't exclude acute ischemia. No mass effect or midline shift. No acute intracranial hemorrhage.     [name] Head/brain Wo Cm 08/16/2015    MRI HEAD:  Equivocal findings for subcentimeter acute infarct RIGHT occipital lobe. Stable moderate to severe chronic small vessel ischemic disease and old RIGHT corona radiata/basal ganglia lacunar infarct.   MRA HEAD:  No acute large vessel occlusion or high-grade stenosis. 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 Chronically occluded LEFT vertebral artery, with focal moderate to high-grade stenosis proximal basilar artery. Stable at least moderate stenosis proximal RIGHT V4 segment. Luminal irregularity of the intracranial vessels compatible with atherosclerosis.    PHYSICAL EXAM HEENT- Normocephalic, no lesions, without obvious abnormality. Normal external eye and conjunctiva. Normal TM's bilaterally. Normal auditory canals and external ears. Normal external nose, mucus membranes and septum. Normal pharynx. Neck supple with no  masses, nodes, nodules or enlargement. Cardiovascular - regular rate and rhythm, S1, S2 normal, no murmur, click, rub or gallop Lungs - chest clear, no wheezing, rales, normal symmetric air entry Abdomen - soft, non-tender; bowel sounds normal; no masses, no organomegaly Extremities - no joint deformities, effusion, or inflammation and no edema  Neurologic Examination: Mental Status: Alert, oriented, thought content appropriate. Speech  fluent without evidence of aphasia. Able to follow commands without difficulty. Cranial Nerves: II-Visual fields were normal. III/IV/VI-Pupils were equal and reacted normally to light. Extraocular movements were full and conjugate.  V/VII-no facial numbness and no facial weakness. VIII-normal. X-normal speech and symmetrical palatal movement. XI: trapezius strength/neck flexion strength normal bilaterally XII-midline tongue extension with normal strength. Motor: 5/5 bilaterally with normal tone and bulk Sensory: Normal throughout. Deep Tendon Reflexes: 2+ and symmetric. Plantars: Mute bilaterally Cerebellar: Normal finger-to-nose testing. Carotid auscultation: Normal   ASSESSMENT/PLAN [name] is a 82 y.o. female with history of Coronary artery disease, hyperlipidemia, lung nodules, carotid bruit, obstructive sleep apnea, previous strokes, and congestive heart failure presenting with left facial droop and left visual field change.  She did not receive IV t-PA due to resolution of deficits.  Equivocal stroke:  Non-dominant subcentimeter acute infarct right occipital lobe possibly embolic from basilar artery.  Resultant transient eft visual field change and left face weakness  MRI  equivocal findings for subcentimeter acute infarct RIGHT occipital lobe with old infarcts as noted above.  MRA  left ICA aneurysm, chronically occluded left vertebral artery, and stenosis of the proximal basilar artery.  Carotid Doppler 1-39% ICA stenosis. Vertebral artery flow is antegrade. Left vertebral waveform is atypical.  2D Echo - pending  LDL 75  HgbA1c pending  VTE prophylaxis -  Diet regular Room service appropriate?: Yes; Fluid consistency:: Thin  aspirin 81 mg daily and clopidogrel 75 mg daily prior to admission, now on aspirin 81 mg daily and clopidogrel 75 mg daily  Patient counseled to be compliant with her antithrombotic medications  Ongoing aggressive stroke risk  factor management  Therapy recommendations: Home health physical therapy recommended  Disposition: Pending  Hypertension  Stable  Permissive hypertension (OK if < 220/120) but gradually normalize in 5-7 days  Hyperlipidemia  Home meds:  Crestor 20 mg daily resumed in hospital  LDL 75, goal < 70  Continue statin at discharge    Other Stroke Risk Factors  Advanced age  Obesity, Body mass index is 32.14 kg/(m^2).   Hx stroke/TIA  Coronary artery disease    Other Active Problems    ATTENDING NOTE: Patient was seen and examined by me personally. Documentation reflects findings. The laboratory and radiographic studies reviewed by me. ROS completed by me personally and pertinent positives fully documented   Condition: stable  Assessment and plan completed by me personally and fully documented above. Plans/Recommendations include:     Stroke work up ongoing  Family would like guidance on how to address chronic UTIs.  I explained that the medical team can assist with the questions   SIGNED BY: Dr. Elissa Hefty    To contact Stroke Continuity provider, please refer to [email or web address] After hours, contact General Neurology

## 2015-08-17 NOTE — Evaluation (Signed)
Clinical/Bedside Swallow Evaluation Patient Details  Name: Gina Bowers MRN: [id number] Date of Birth: December 18, 1925  Today's Date: 08/17/2015 Time: SLP Start Time (ACUTE ONLY): 1038 SLP Stop Time (ACUTE ONLY): 1102 SLP Time Calculation (min) (ACUTE ONLY): 24 min  Past Medical History:  Past Medical History  Diagnosis Date  . Coronary artery disease     DR. Rollene Fare IS PT'S CARDIOLOGIST - HX CABG AND HAS STENT AND TOLD SHE HAS ANOTHER BLOCKAGE-BEING TREATED MEDICALLY  . High cholesterol   . Lung nodules     TOLD SHE HAS INTERSTITUAL LUNG DISEASE  . Shortness of breath     AND WHEEZING AT TIMES--NO INHALERS  . Vertigo   . Arthritis   . GERD (gastroesophageal reflux disease)   . Urinary incontinence   . UTI (lower urinary tract infection)     HX OF UTI'S-- LAST TIME COUPLE OF MONTHS AGO  . Hypothyroidism   . Blood transfusion 2007    AFTER HIP REPLACEMENT  . Fracture FEB 2013    FRACTURED LEFT ANKLE--NO SURGERY--WAS IN BOOT UNTIL COUPLE WEEKS AGO  . Depression   . Carotid bruit     PT'S DAUGHTER STATES PT HAD RECENT CAROTID STUDY AND WAS TOLD NO SIGNIFICANT BLOCKAGES  . [place] virus     COUPLE OF WEEKS AGO--ALL SYMTOMS RESOLVED PER PT  . Kidney stones     IN THE PAST  . Stroke ([place])     SEVERAL STROKES -TOLD SHE HAS CEREBELLUM BLOCKAGE AS RESULT OF STROKE--BUT NEUROLOGIST SAID HE DID NOT WANT TO DO ANY PROCEDURE BECAUSE OF HER AGE .  PT HAS SLIGHT LEFT FOOT DROP-DRAGS FOOT WHEN WALKING - USES WALKER  . CAD (coronary artery disease) of artery bypass graft 06/06/2014     2009 CABG 4, Dr. Servando Snare (LIMA to LAD , SVG to diagonal, SVG to circumflex, SVG to PDA  Cath 2012 in  Recent ill,[place] patent LIMA ,  Patent SVG to diagonal occluded SVG to RCA, report 5 x 18 BMS stent to native RCA  . Ischemic colitis ([place]) 2010  . Obstructive sleep apnea 2010  . CHF (congestive heart failure) ([place])    Past Surgical History:  Past Surgical History  Procedure Laterality Date  .  Coronary angioplasty  10/2010    WITH STENT PLACEMENT  . Coronary artery bypass graft  2009  . Joint replacement  2007    HIP REPLACEMENT -LEFT  . Bladder tack      1969 AND 1990  . Cystoscopy with injection  09/21/2011    Procedure: CYSTOSCOPY WITH INJECTION;  Surgeon: Ailene Rud, MD;  Location: [facility];  Service: Urology;  Laterality: N/A;  Peri Urethral Macroplastique Injection and Estring Placement   HPI:  Gina Bowers is a 82 y.o. female with h/o CVA, TIA, CAD. Patient presents to the ED with acute onset of L visual field changes and left facial droop. Symptoms onset 4:30 pm today, have resolved at this point. CT head is negative. No weakness nor numbness of extremities. Patient on ASA and plavix daily due to h/o CVA in past. MRI of brain revealed acute infarct of right occipital lobe with old RIGHT corona radiata/basal ganglia lacunar infarct.    Assessment / Plan / Recommendation Clinical Impression  Pts swallow presents within functional limits. Oral motor exam primarily insignificant with minimal left facial droop. No overt signs or symptoms of aspiration with any PO this date. Pts grandaughter present who reports pt with occasional episodic aspiration at baseline. Educated regarding  safe swallow strategies to reduce aspiration risk. Recommend regular thin diet implmentation. No further swallow needs identified.      Aspiration Risk  Mild aspiration risk    Diet Recommendation     Medication Administration: Whole meds with liquid    Other  Recommendations Oral Care Recommendations: Oral care BID   Follow up Recommendations       Frequency and Duration            Prognosis        Swallow Study   General Date of Onset: 08/16/15 HPI: Gina Bowers is a 82 y.o. female with h/o CVA, TIA, CAD. Patient presents to the ED with acute onset of L visual field changes and left facial droop. Symptoms onset 4:30 pm today, have resolved at this point. CT head is  negative. No weakness nor numbness of extremities. Patient on ASA and plavix daily due to h/o CVA in past. MRI of brain revealed acute infarct of right occipital lobe with old RIGHT corona radiata/basal ganglia lacunar infarct.  Type of Study: Bedside Swallow Evaluation Diet Prior to this Study: NPO Temperature Spikes Noted: No Respiratory Status: Room air History of Recent Intubation: No Behavior/Cognition: Alert;Cooperative;Pleasant mood Oral Cavity Assessment: Within Functional Limits Oral Cavity - Dentition: Dentures, top;Adequate natural dentition Vision: Functional for self-feeding Self-Feeding Abilities: Able to feed self Patient Positioning: Upright in bed Baseline Vocal Quality: Low vocal intensity Volitional Cough: Weak Volitional Swallow: Able to elicit    Oral/Motor/Sensory Function Overall Oral Motor/Sensory Function: Mild impairment Facial Symmetry: Abnormal symmetry left (mild facial droop)   Ice Chips Ice chips: Within functional limits   Thin Liquid Thin Liquid: Within functional limits    Nectar Thick Nectar Thick Liquid: Not tested   Honey Thick Honey Thick Liquid: Not tested   Puree Puree: Within functional limits   Solid   GO   Solid: Within functional limits    Functional Assessment Tool Used: skilled observation Functional Limitations: Swallowing Swallow Current Status [id number] At least 1 percent but less than 20 percent impaired, limited or restricted Swallow Goal Status [phone]): At least 1 percent but less than 20 percent impaired, limited or restricted Swallow Discharge Status [phone]): At least 1 percent but less than 20 percent impaired, limited or restricted      Arvil Chaco MA, [place] 08/17/2015,11:33 AM

## 2015-08-17 NOTE — Evaluation (Signed)
Speech Language Pathology Evaluation Patient Details Name: Gina Bowers MRN: [id number] DOB: 07/07/1925 Today's Date: 08/17/2015 Time: [id number] SLP Time Calculation (min) (ACUTE ONLY): 22 min  Problem List:  Patient Active Problem List   Diagnosis Date Noted  . Possible Renovascular hypertension 07/25/2015  . Hyperlipidemia 07/25/2015  . History of stroke 07/25/2015  . Vertebrobasilar artery stenosis 07/25/2015  . CAD (coronary artery disease) of artery bypass graft 06/06/2014  . Diastolic dysfunction [id number]  . CVA (cerebral vascular accident) ([place]) 11/17/2011  . TIA (transient ischemic attack) 11/13/2011  . Weakness 11/13/2011  . OBSTRUCTIVE SLEEP APNEA 11/23/2008  . PULMONARY NODULE 11/23/2008  . Hypoxemia 11/23/2008  . HYPOTHYROIDISM 11/22/2008  . HYPERLIPIDEMIA 11/22/2008  . ANXIETY 11/22/2008  . STROKE 11/22/2008   Past Medical History:  Past Medical History  Diagnosis Date  . Coronary artery disease     DR. Rollene Fare IS PT'S CARDIOLOGIST - HX CABG AND HAS STENT AND TOLD SHE HAS ANOTHER BLOCKAGE-BEING TREATED MEDICALLY  . High cholesterol   . Lung nodules     TOLD SHE HAS INTERSTITUAL LUNG DISEASE  . Shortness of breath     AND WHEEZING AT TIMES--NO INHALERS  . Vertigo   . Arthritis   . GERD (gastroesophageal reflux disease)   . Urinary incontinence   . UTI (lower urinary tract infection)     HX OF UTI'S-- LAST TIME COUPLE OF MONTHS AGO  . Hypothyroidism   . Blood transfusion 2007    AFTER HIP REPLACEMENT  . Fracture FEB 2013    FRACTURED LEFT ANKLE--NO SURGERY--WAS IN BOOT UNTIL COUPLE WEEKS AGO  . Depression   . Carotid bruit     PT'S DAUGHTER STATES PT HAD RECENT CAROTID STUDY AND WAS TOLD NO SIGNIFICANT BLOCKAGES  . [place] virus     COUPLE OF WEEKS AGO--ALL SYMTOMS RESOLVED PER PT  . Kidney stones     IN THE PAST  . Stroke ([place])     SEVERAL STROKES -TOLD SHE HAS CEREBELLUM BLOCKAGE AS RESULT OF STROKE--BUT NEUROLOGIST SAID HE DID NOT WANT TO DO  ANY PROCEDURE BECAUSE OF HER AGE .  PT HAS SLIGHT LEFT FOOT DROP-DRAGS FOOT WHEN WALKING - USES WALKER  . CAD (coronary artery disease) of artery bypass graft 06/06/2014     2009 CABG 4, Dr. Servando Snare (LIMA to LAD , SVG to diagonal, SVG to circumflex, SVG to PDA  Cath 2012 in  Recent ill,[place] patent LIMA ,  Patent SVG to diagonal occluded SVG to RCA, report 5 x 18 BMS stent to native RCA  . Ischemic colitis ([place]) 2010  . Obstructive sleep apnea 2010  . CHF (congestive heart failure) ([place])    Past Surgical History:  Past Surgical History  Procedure Laterality Date  . Coronary angioplasty  10/2010    WITH STENT PLACEMENT  . Coronary artery bypass graft  2009  . Joint replacement  2007    HIP REPLACEMENT -LEFT  . Bladder tack      1969 AND 1990  . Cystoscopy with injection  09/21/2011    Procedure: CYSTOSCOPY WITH INJECTION;  Surgeon: Ailene Rud, MD;  Location: [facility];  Service: Urology;  Laterality: N/A;  Peri Urethral Macroplastique Injection and Estring Placement   HPI:  Gina Bowers is a 82 y.o. female with h/o CVA, TIA, CAD. Patient presents to the ED with acute onset of L visual field changes and left facial droop. Symptoms onset 4:30 pm today, have resolved at this point. CT head is negative. No  weakness nor numbness of extremities. Patient on ASA and plavix daily due to h/o CVA in past. MRI of brain revealed acute infarct of right occipital lobe with old RIGHT corona radiata/basal ganglia lacunar infarct.    Assessment / Plan / Recommendation Clinical Impression  Pt presents at baseline for cognitive linguistics. Note prior hx of multiple CVAs (remote right corona radiate/basal ganglia infarct) as well as acute onset of right occipital region infarct. Pt reports new left visual field deficits however, pt was able to complete graphic expression and smaller scale visual tasks without observable difficulty. PLOF, pt resides at [facility] where family and  caregivers provide assistance or take care of all complex ADLs including medicine and financial management, cooking, and household tasks. Speech and language appear intact. No skilled ST intervention indicated. ST to sign off.      SLP Assessment  Patient does not need any further Speech Lanaguage Pathology Services    Follow Up Recommendations  24 hour supervision/assistance    Frequency and Duration           SLP Evaluation Prior Functioning  Cognitive/Linguistic Baseline: Baseline deficits Baseline deficit details: decreased STM, decreased executive function skills Type of Home: Independent living facility  Lives With: Alone;Other (Comment) (caregiver comes 7 days a week for 2-3 hours a day) Available Help at Discharge: Family;Personal care attendant;Available PRN/intermittently Education: high school Vocation: Retired   Associate Professor  Overall Cognitive Status: History of cognitive impairments - at baseline Arousal/Alertness: Awake/alert Orientation Level: Disoriented to time;Oriented to person;Oriented to place;Oriented to situation (disoriented to day of the month only) Attention: Focused;Sustained Focused Attention: Appears intact Sustained Attention: Appears intact Memory: Impaired Memory Impairment: Decreased recall of new information Awareness: Impaired Problem Solving: Appears intact Safety/Judgment: Appears intact    Comprehension  Auditory Comprehension Overall Auditory Comprehension: Appears within functional limits for tasks assessed Visual Recognition/Discrimination Discrimination: Exceptions to [facility] (reports left visual deficits though compensates during tasks)    Expression Expression Primary Mode of Expression: Verbal Verbal Expression Overall Verbal Expression: Appears within functional limits for tasks assessed Written Expression Dominant Hand: Right   Oral / Motor  Oral Motor/Sensory Function Overall Oral Motor/Sensory Function: Mild impairment Facial  Symmetry: Abnormal symmetry left Motor Speech Overall Motor Speech: Appears within functional limits for tasks assessed   GO          Functional Assessment Tool Used: skilled observation Functional Limitations: Motor speech Swallow Current Status [phone]): At least 1 percent but less than 20 percent impaired, limited or restricted Swallow Goal Status [phone]): At least 1 percent but less than 20 percent impaired, limited or restricted Swallow Discharge Status [phone]): At least 1 percent but less than 20 percent impaired, limited or restricted          Arvil Chaco MA, [place] Pathologist    Levi Aland 08/17/2015, 11:51 AM

## 2015-08-17 NOTE — Evaluation (Signed)
Physical Therapy Evaluation Patient Details Name: Gina Bowers MRN: [id number] DOB: 11/05/1925 Today's Date: 08/17/2015   History of Present Illness  Gina Bowers is a 82 y.o. female with h/o CVA, TIA, CAD. Patient presents to the ED with acute onset of L visual field changes and left facial droop. Symptoms onset 4:30 pm today, have resolved at this point. CT head is negative. No weakness nor numbness of extremities.  Clinical Impression  Pt admitted with above. Pt tolerated OOB mobility well and reports she can have someone stay with her for 24/7 for a few days. Pt safe to d/c back to [facility] once medically ready with support of 24/7 assist and HHPT.    Follow Up Recommendations Home health PT;Supervision/Assistance - 24 hour    Equipment Recommendations  None recommended by PT    Recommendations for Other Services       Precautions / Restrictions Precautions Precautions: Fall Precaution Comments: L visual eye changes Restrictions Weight Bearing Restrictions: No      Mobility  Bed Mobility Overal bed mobility: Modified Independent             General bed mobility comments: increased time, more labored effort than normal but no external assist needed  Transfers Overall transfer level: Needs assistance Equipment used: Rolling walker (2 wheeled) Transfers: Sit to/from Stand Sit to Stand: Min guard         General transfer comment: v/c's to push up from bed  Ambulation/Gait Ambulation/Gait assistance: Min guard Ambulation Distance (Feet): 120 Feet Assistive device: Rolling walker (2 wheeled) Gait Pattern/deviations: Step-through pattern;Decreased stride length Gait velocity: wfl for age   General Gait Details: v/c's for walker management due to patient used to rollator otherwise steady and safe, no episodes of instability  Stairs            Wheelchair Mobility    Modified Rankin (Stroke Patients Only) Modified Rankin (Stroke Patients  Only) Pre-Morbid Rankin Score: Moderate disability Modified Rankin: Moderate disability     Balance Overall balance assessment: Needs assistance         Standing balance support: During functional activity Standing balance-Leahy Scale: Fair Standing balance comment: pt able to complete hygiene s/p tolieting                             Pertinent Vitals/Pain Pain Assessment: No/denies pain    Home Living Family/patient expects to be discharged to:: Assisted living ([place])   Available Help at Discharge: Family;Personal care attendant;Available PRN/intermittently Type of Home: Independent living facility         Home Equipment: Walker - 4 wheels Additional Comments: has a caregiver that comes 7x/wk for 2 hours    Prior Function Level of Independence: Needs assistance   Gait / Transfers Assistance Needed: uses rollator  ADL's / Homemaking Assistance Needed: caregiver assists with dressing and bathing        Hand Dominance   Dominant Hand: Right    Extremity/Trunk Assessment   Upper Extremity Assessment: Overall WFL for tasks assessed           Lower Extremity Assessment: Overall WFL for tasks assessed      Cervical / Trunk Assessment: Normal  Communication   Communication: No difficulties  Cognition Arousal/Alertness: Awake/alert Behavior During Therapy: WFL for tasks assessed/performed Overall Cognitive Status: Within Functional Limits for tasks assessed  General Comments General comments (skin integrity, edema, etc.): assisted pt into bathroom and pt indep with tolieting    Exercises        Assessment/Plan    PT Assessment Patient needs continued PT services  PT Diagnosis Difficulty walking;Generalized weakness   PT Problem List Decreased strength;Decreased activity tolerance;Decreased balance;Decreased mobility;Decreased coordination;Decreased cognition  PT Treatment Interventions DME  instruction;Gait training;Stair training;Functional mobility training;Therapeutic activities;Therapeutic exercise;Balance training;Neuromuscular re-education   PT Goals (Current goals can be found in the Care Plan section) Acute Rehab PT Goals Patient Stated Goal: home soon PT Goal Formulation: With patient Time For Goal Achievement: 08/24/15 Potential to Achieve Goals: Good    Frequency Min 3X/week   Barriers to discharge        Co-evaluation               End of Session Equipment Utilized During Treatment: Gait belt Activity Tolerance: Patient tolerated treatment well Patient left: in chair;with call bell/phone within reach;with chair alarm set Nurse Communication: Mobility status    Functional Assessment Tool Used: clinical judgement Functional Limitation: Mobility: Walking and moving around Mobility: Walking and Moving Around Current Status [phone]): At least 1 percent but less than 20 percent impaired, limited or restricted Mobility: Walking and Moving Around Goal Status [phone]): At least 1 percent but less than 20 percent impaired, limited or restricted    Time: 1435-1456 PT Time Calculation (min) (ACUTE ONLY): 21 min   Charges:   PT Evaluation $PT Eval Low Complexity: 1 Procedure     PT G Codes:   PT G-Codes **NOT FOR INPATIENT CLASS** Functional Assessment Tool Used: clinical judgement Functional Limitation: Mobility: Walking and moving around Mobility: Walking and Moving Around Current Status [id number]): At least 1 percent but less than 20 percent impaired, limited or restricted Mobility: Walking and Moving Around Goal Status [phone]): At least 1 percent but less than 20 percent impaired, limited or restricted    Kingsley Callander 08/17/2015, 3:59 PM  Kittie Plater, PT, DPT Pager #: [phone] Office #: [phone]

## 2015-08-17 NOTE — Progress Notes (Signed)
VASCULAR LAB PRELIMINARY  PRELIMINARY  PRELIMINARY  PRELIMINARY  Carotid duplex completed.    Preliminary report:  1-39% ICA stenosis.  Vertebral artery flow is antegrade.  Left vertebral waveform is atypical.  Irven Ingalsbe, RVT 08/17/2015, 3:49 PM

## 2015-08-18 ENCOUNTER — Inpatient Hospital Stay (HOSPITAL_COMMUNITY): Payer: Medicare Other

## 2015-08-18 DIAGNOSIS — E785 Hyperlipidemia, unspecified: Secondary | ICD-10-CM

## 2015-08-18 DIAGNOSIS — G459 Transient cerebral ischemic attack, unspecified: Secondary | ICD-10-CM

## 2015-08-18 LAB — ECHOCARDIOGRAM COMPLETE
HEIGHTINCHES: 61 in
WEIGHTICAEL: 2720 [oz_av]

## 2015-08-18 NOTE — Evaluation (Addendum)
Occupational Therapy Evaluation and Discharge Patient Details Name: Gina Bowers MRN: [id number] DOB: 08-14-1925 Today's Date: 08/18/2015    History of Present Illness PHANTASIA DAJANI is a 82 y.o. female with h/o CVA, TIA, CAD. Patient presents to the ED with acute onset of L visual field changes and left facial droop. Symptoms onset 4:30 pm today, have resolved at this point. CT head is negative. No weakness nor numbness of extremities.   Clinical Impression   Pt reports she required assist from an aide for bathing and dressing PTA. Currently pt overall supervision for safety with ADLs and functional mobility. Challenged pt with higher level balance activities; pt with no noted LOB and with safe technique. Educated on home safety and energy conservation. Pt reports she feels all her symptoms have resolved; no visual deficits or weakness noted with functional activities. No further acute OT needs identified; signing off at this time. Please re-consult if needs change. Thank you for this referral.     Follow Up Recommendations  No OT follow up;Supervision/Assistance - 24 hour    Equipment Recommendations  None recommended by OT    Recommendations for Other Services       Precautions / Restrictions Precautions Precautions: Fall Restrictions Weight Bearing Restrictions: No      Mobility Bed Mobility Overal bed mobility: Modified Independent                Transfers Overall transfer level: Needs assistance Equipment used: Rolling walker (2 wheeled) Transfers: Sit to/from Stand Sit to Stand: Supervision         General transfer comment: Supervision for safety. Good hand placement and technique.    Balance Overall balance assessment: Needs assistance Sitting-balance support: Feet supported;No upper extremity supported Sitting balance-Leahy Scale: Good     Standing balance support: No upper extremity supported;During functional activity Standing balance-Leahy  Scale: Fair                              ADL Overall ADL's : Needs assistance/impaired     Grooming: Supervision/safety;Standing               Lower Body Dressing: Supervision/safety;Sit to/from stand   Toilet Transfer: Supervision/safety;Ambulation;BSC;Comfort height toilet   Toileting- Clothing Manipulation and Hygiene: Supervision/safety;Sit to/from stand   Tub/ Shower Transfer: Supervision/safety;Ambulation;Shower seat;Rolling walker   Functional mobility during ADLs: Supervision/safety;Rolling walker General ADL Comments: Challenged pt with higher level balance activities; no LOB noted and pt with safe technique.     Vision Vision Assessment?: No apparent visual deficits   Perception     Praxis      Pertinent Vitals/Pain Pain Assessment: No/denies pain     Hand Dominance Right   Extremity/Trunk Assessment Upper Extremity Assessment Upper Extremity Assessment: Overall WFL for tasks assessed   Lower Extremity Assessment Lower Extremity Assessment: Defer to PT evaluation   Cervical / Trunk Assessment Cervical / Trunk Assessment: Normal   Communication Communication Communication: No difficulties   Cognition Arousal/Alertness: Awake/alert Behavior During Therapy: WFL for tasks assessed/performed Overall Cognitive Status: Within Functional Limits for tasks assessed                     General Comments       Exercises       Shoulder Instructions      Home Living Family/patient expects to be discharged to:: Assisted living Living Arrangements: Alone Available Help at Discharge: Family;Personal care attendant;Available PRN/intermittently Type  of Home: Assisted living Home Access: Elevator     Home Layout: One level     Bathroom Shower/Tub: Occupational psychologist: Standard Bathroom Accessibility: Yes How Accessible: Accessible via walker Home Equipment: Vian - 4 wheels;Shower seat;Grab bars -  tub/shower;Bedside commode   Additional Comments: has a caregiver that comes 7x/wk for 2 hours      Prior Functioning/Environment Level of Independence: Needs assistance  Gait / Transfers Assistance Needed: uses rollator ADL's / Homemaking Assistance Needed: caregiver assists with dressing and bathing. All meals and cleaning provided by ALF        OT Diagnosis: Generalized weakness   OT Problem List:     OT Treatment/Interventions:      OT Goals(Current goals can be found in the care plan section) Acute Rehab OT Goals Patient Stated Goal: home soon OT Goal Formulation: All assessment and education complete, DC therapy  OT Frequency:     Barriers to D/C:            Co-evaluation              End of Session Equipment Utilized During Treatment: Rolling walker Nurse Communication: Mobility status  Activity Tolerance: Patient tolerated treatment well Patient left: in bed;with call bell/phone within reach   Time: 1541-1556 OT Time Calculation (min): 15 min Charges:  OT General Charges $OT Visit: 1 Procedure OT Evaluation $OT Eval Low Complexity: 1 Procedure G-Codes:     Binnie Kand M.S., OTR/L Pager: [phone]  08/18/2015, 4:28 PM

## 2015-08-18 NOTE — Progress Notes (Signed)
[facility] PROGRESS NOTE  Gina Bowers [id number] DOB: 11-13-1925 DOA: 08/16/2015 PCP: Irven Shelling, MD  Assessment/Plan:  1. TIA - still w/o sx. Likely d/c in AM. 1. Stroke pathway 2. MRI/MRA 3. 2d echo - ordered 4. Carotid dopplers - pending results 5. PT/OT/SLP - 24hr supervision recommended; OT pending; SLP signed off 6. Tele monitor can be stopped 7. Continue asa and plavix 2. HTN - continue home meds. Will cont to monitor. 3. HLD - continue pt's statin  Code Status: full Family Communication: none (indicate person spoken with, relationship, and if by phone, the number) Disposition Plan: home   Consultants:  neuro  Procedures:  none  Antibiotics:  none (indicate start date, and stop date if known)  HPI/Subjective: Patient states that she is again still asymptomatic. Patient states that her vision is at baseline. Denies any headache, nausea, vomiting, abdominal pain, leg pain, chest pain.  Objective: Filed Vitals:   08/18/15 0951 08/18/15 1400  BP: 148/64 132/76  Pulse: 71 76  Temp: 98.5 F (36.9 C) 98 F (36.7 C)  Resp: 18 18    Intake/Output Summary (Last 24 hours) at 08/18/15 1447 Last data filed at 08/18/15 1100  Gross per 24 hour  Intake   1440 ml  Output      0 ml  Net   1440 ml   Filed Weights   08/16/15 1927  Weight: 77.111 kg (170 lb)    Exam:   General:  No diaphoresis, anxious, NAD  Cardiovascular: Regular rate and rhythm no murmurs rubs or gallops  Respiratory: Clear to auscultation bilaterally no more breathing  Abdomen: Nondistended bowel sounds normal nontender palpation  Musculoskeletal: Moving all extremities, no deformity, 5 / 5 strength    Data Reviewed: Basic Metabolic Panel:  Recent Labs Lab 08/16/15 1940 08/17/15 0936  NA 132* 135  K 4.3 4.3  CL 96* 100*  CO2 28 26  GLUCOSE 102* 88  BUN 16 10  CREATININE 0.44 0.44  CALCIUM 9.1 8.7*   Liver Function Tests:  Recent Labs Lab  08/16/15 1940  AST 20  ALT 18  ALKPHOS 57  BILITOT 0.3  PROT 7.0  ALBUMIN 3.8   No results for input(s): LIPASE, AMYLASE in the last 168 hours. No results for input(s): AMMONIA in the last 168 hours. CBC:  Recent Labs Lab 08/16/15 1940  WBC 9.7  NEUTROABS 5.2  HGB 12.1  HCT 36.9  MCV 94.4  PLT 235   Cardiac Enzymes:  Recent Labs Lab 08/16/15 1940  TROPONINI <0.03   BNP (last 3 results) No results for input(s): BNP in the last 8760 hours.  ProBNP (last 3 results) No results for input(s): PROBNP in the last 8760 hours.  CBG:  Recent Labs Lab 08/16/15 1946  GLUCAP 96    Recent Results (from the past 240 hour(s))  MRSA PCR Screening     Status: None   Collection Time: 08/16/15 11:27 PM  Result Value Ref Range Status   MRSA by PCR NEGATIVE NEGATIVE Final    Comment:        The GeneXpert MRSA Assay (FDA approved for NASAL specimens only), is one component of a comprehensive MRSA colonization surveillance program. It is not intended to diagnose MRSA infection nor to guide or monitor treatment for MRSA infections.      Studies: Ct Head Wo Contrast  08/16/2015  CLINICAL DATA:  Vision changes at 16:30 this afternoon. Left facial droop. Code stroke. EXAM: CT HEAD WITHOUT CONTRAST TECHNIQUE: Contiguous axial  images were obtained from the base of the skull through the vertex without intravenous contrast. COMPARISON:  CT head 03/11/2013.  MRI brain 07/31/2012. FINDINGS: Diffuse cerebral atrophy. Ventricular dilatation consistent with central atrophy. Low-attenuation changes throughout the deep white matter bilaterally is nonspecific but likely represent small vessel ischemic change. Slight asymmetry of white matter changes to the right. Can't exclude early ischemia. Old lacunar infarcts. No mass effect or midline shift. No abnormal extra-axial fluid collections. Gray-white matter junctions are distinct. Basal cisterns are not effaced. No evidence of acute  intracranial hemorrhage. No depressed skull fractures. Visualized paranasal sinuses and mastoid air cells are not opacified. Vascular calcifications. IMPRESSION: Chronic atrophy and small vessel ischemic changes. Slight asymmetry of white matter changes on the right. Can't exclude acute ischemia. No mass effect or midline shift. No acute intracranial hemorrhage. These results were called by telephone at the time of interpretation on 08/16/2015 at 7:59 pm to Dr. Julianne Rice , who verbally acknowledged these results. Electronically Signed   By: Lucienne Capers M.D.   On: 08/16/2015 20:01   Mr Brain Wo Contrast  08/16/2015  CLINICAL DATA:  Acute onset LEFT visual field changes and LEFT facial droop at 4:30 p.m. today, now resolved. History of stroke and coronary artery disease, on anticoagulation. EXAM: MRI HEAD WITHOUT CONTRAST MRA HEAD WITHOUT CONTRAST TECHNIQUE: Multiplanar, multiecho pulse sequences of the brain and surrounding structures were obtained without intravenous contrast. Angiographic images of the head were obtained using MRA technique without contrast. COMPARISON:  CT head August 16, 2015 1951 hours and MRI/MRA head July 31, 2012 FINDINGS: MRI HEAD FINDINGS Subcentimeter focus of reduced diffusion RIGHT occipital lobe on coronal 9/ 78, not present on the axial T2 view. Punctate focus of susceptibility artifact LEFT mesial posterior frontal lobe, nonspecific. Moderate ventriculomegaly, unchanged from prior examination of base of global parenchymal brain volume loss. RIGHT insular encephalomalacia unchanged from prior examination. Prominent periventricular spaces bilateral basal ganglia with superimposed old RIGHT carina radiata/basal ganglia lacunar infarct. No midline shift, mass effect or mass lesions. Confluent supratentorial and pontine white matter T2 hyperintensities are similar. No abnormal extra-axial fluid collections. Status post bilateral ocular lens implants. Trace paranasal sinus  mucosal thickening without air-fluid levels. Mastoid air cells are well aerated. No abnormal sellar expansion. No cerebellar tonsillar ectopia. No suspicious calvarial bone marrow signal. Patient is edentulous. MRA HEAD FINDINGS Anterior circulation: Internal carotid arteries are patent bilaterally. Mildly ectatic distal LEFT petrous internal carotid artery. 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 Moderate, progressed stenosis RIGHT supraclinoid internal carotid artery. Luminal irregularity of the bilateral carotid siphons compatible with atherosclerosis. Normal appearance the anterior cerebral arteries, with patent anterior communicating artery. Normal flow related enhancement bilateral middle cerebral arteries with moderate luminal irregularity of the mid and distal segments. Posterior circulation: Chronically occluded LEFT vertebral artery, with similar at least moderate stenosis proximal RIGHT V4 segment. Retrograde flow into LEFT posterior-inferior cerebellar artery. Focal moderate to high-grade stenosis proximal basilar artery with poststenotic dolichoectasia. Patent RIGHT posterior communicating artery. Flow related enhancement within bilateral posterior cerebral arteries, patent. Focal moderate stenosis proximal LEFT P2 segment. Mild luminal irregularity of the bilateral posterior cerebral arteries. IMPRESSION: MRI HEAD: Equivocal findings for subcentimeter acute infarct RIGHT occipital lobe. Stable moderate to severe chronic small vessel ischemic disease and old RIGHT corona radiata/basal ganglia lacunar infarct. MRA HEAD: No acute large vessel occlusion or high-grade stenosis. 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 Chronically occluded  LEFT vertebral artery, with focal moderate to high-grade stenosis proximal basilar artery. Stable at least moderate stenosis proximal RIGHT V4 segment. Luminal irregularity of  the intracranial vessels compatible with atherosclerosis. Electronically Signed   By: Elon Alas M.D.   On: 08/16/2015 23:02   [name] Head/brain Wo Cm  08/16/2015  CLINICAL DATA:  Acute onset LEFT visual field changes and LEFT facial droop at 4:30 p.m. today, now resolved. History of stroke and coronary artery disease, on anticoagulation. EXAM: MRI HEAD WITHOUT CONTRAST MRA HEAD WITHOUT CONTRAST TECHNIQUE: Multiplanar, multiecho pulse sequences of the brain and surrounding structures were obtained without intravenous contrast. Angiographic images of the head were obtained using MRA technique without contrast. COMPARISON:  CT head August 16, 2015 1951 hours and MRI/MRA head July 31, 2012 FINDINGS: MRI HEAD FINDINGS Subcentimeter focus of reduced diffusion RIGHT occipital lobe on coronal 9/ 78, not present on the axial T2 view. Punctate focus of susceptibility artifact LEFT mesial posterior frontal lobe, nonspecific. Moderate ventriculomegaly, unchanged from prior examination of base of global parenchymal brain volume loss. RIGHT insular encephalomalacia unchanged from prior examination. Prominent periventricular spaces bilateral basal ganglia with superimposed old RIGHT carina radiata/basal ganglia lacunar infarct. No midline shift, mass effect or mass lesions. Confluent supratentorial and pontine white matter T2 hyperintensities are similar. No abnormal extra-axial fluid collections. Status post bilateral ocular lens implants. Trace paranasal sinus mucosal thickening without air-fluid levels. Mastoid air cells are well aerated. No abnormal sellar expansion. No cerebellar tonsillar ectopia. No suspicious calvarial bone marrow signal. Patient is edentulous. MRA HEAD FINDINGS Anterior circulation: Internal carotid arteries are patent bilaterally. Mildly ectatic distal LEFT petrous internal carotid artery. 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 Moderate,  progressed stenosis RIGHT supraclinoid internal carotid artery. Luminal irregularity of the bilateral carotid siphons compatible with atherosclerosis. Normal appearance the anterior cerebral arteries, with patent anterior communicating artery. Normal flow related enhancement bilateral middle cerebral arteries with moderate luminal irregularity of the mid and distal segments. Posterior circulation: Chronically occluded LEFT vertebral artery, with similar at least moderate stenosis proximal RIGHT V4 segment. Retrograde flow into LEFT posterior-inferior cerebellar artery. Focal moderate to high-grade stenosis proximal basilar artery with poststenotic dolichoectasia. Patent RIGHT posterior communicating artery. Flow related enhancement within bilateral posterior cerebral arteries, patent. Focal moderate stenosis proximal LEFT P2 segment. Mild luminal irregularity of the bilateral posterior cerebral arteries. IMPRESSION: MRI HEAD: Equivocal findings for subcentimeter acute infarct RIGHT occipital lobe. Stable moderate to severe chronic small vessel ischemic disease and old RIGHT corona radiata/basal ganglia lacunar infarct. MRA HEAD: No acute large vessel occlusion or high-grade stenosis. 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 Chronically occluded LEFT vertebral artery, with focal moderate to high-grade stenosis proximal basilar artery. Stable at least moderate stenosis proximal RIGHT V4 segment. Luminal irregularity of the intracranial vessels compatible with atherosclerosis. Electronically Signed   By: Elon Alas M.D.   On: 08/16/2015 23:02    Scheduled Meds: . antiseptic oral rinse  7 mL Mouth Rinse BID  . aspirin EC  81 mg Oral QPM  . clopidogrel  75 mg Oral Daily  . enoxaparin (LOVENOX) injection  40 mg Subcutaneous Q24H  . levothyroxine  75 mcg Oral QAC breakfast  . lisinopril  5 mg Oral Daily  . mirabegron ER  50 mg Oral BID  . pantoprazole  40 mg Oral  Daily  . polyvinyl alcohol  2 drop Both Eyes TID  . rosuvastatin  20  mg Oral q1800  . sertraline  75 mg Oral Daily   Continuous Infusions:    Principal Problem:   TIA (transient ischemic attack) Active Problems:   Possible Renovascular hypertension   Hyperlipidemia   Acute CVA (cerebrovascular accident) ([place])    Time spent: [facility] Pager [phone].  If 7PM-7AM, please contact night-coverage at [email or web address], password [facility] 08/18/2015, 2:47 PM  LOS: 1 day

## 2015-08-18 NOTE — Progress Notes (Signed)
  Echocardiogram 2D Echocardiogram has been performed.  Gina Bowers 08/18/2015, 4:38 PM

## 2015-08-18 NOTE — Progress Notes (Signed)
STROKE TEAM PROGRESS NOTE   HISTORY OF PRESENT ILLNESS at admission Gina Bowers is an 82 y.o. female with a history of previous stroke and TIAs, hypertension, hyperlipidemia and coronary artery disease, presenting with acute onset of left visual field changes and left facial droop. She described visual changes as blurred vision with ink colored lights involving the upper half of her vision on the left side. Onset was at 4:30 PM today. CT scan of her head showed no acute intracranial abnormality. Deficits subsequently resolved. Patient has been taking aspirin and Plavix daily. She had no speech changes nor weakness and numbness involving left extremities.  LSN: 4:30 PM on 08/16/2015 tPA Given: No: Deficits resolved mRankin:   SUBJECTIVE (INTERVAL HISTORY) Patient's granddaughter and grandson-in-law were at the bedside.  We discussed patient's symptoms of transient left eye vision loss which occurred prior to admission.  PT has recommended home therapy.     OBJECTIVE Temp:  [98 F (36.7 C)-99.1 F (37.3 C)] 98 F (36.7 C) (03/26 1400) Pulse Rate:  [67-76] 76 (03/26 1400) Cardiac Rhythm:  [-] Normal sinus rhythm (03/26 0700) Resp:  [16-20] 18 (03/26 1400) BP: (120-148)/(48-77) 132/76 mmHg (03/26 1400) SpO2:  [92 %-98 %] 94 % (03/26 1400)  CBC:   Recent Labs Lab 08/16/15 1940  WBC 9.7  NEUTROABS 5.2  HGB 12.1  HCT 36.9  MCV 94.4  PLT [id number]    Basic Metabolic Panel:   Recent Labs Lab 08/16/15 1940 08/17/15 0936  NA 132* 135  K 4.3 4.3  CL 96* 100*  CO2 28 26  GLUCOSE 102* 88  BUN 16 10  CREATININE 0.44 0.44  CALCIUM 9.1 8.7*    Lipid Panel:     Component Value Date/Time   CHOL 147 08/17/2015 0648   TRIG 70 08/17/2015 0648   HDL 58 08/17/2015 0648   CHOLHDL 2.5 08/17/2015 0648   VLDL 14 08/17/2015 0648   LDLCALC 75 08/17/2015 0648   HgbA1c:  Lab Results  Component Value Date   HGBA1C 5.7* 11/13/2011   Urine Drug Screen:     Component Value Date/Time    LABOPIA NONE DETECTED 08/16/2015 2216   COCAINSCRNUR NONE DETECTED 08/16/2015 2216   LABBENZ NONE DETECTED 08/16/2015 2216   AMPHETMU NONE DETECTED 08/16/2015 2216   THCU NONE DETECTED 08/16/2015 2216   LABBARB NONE DETECTED 08/16/2015 2216      IMAGING  Ct Head Wo Contrast 08/16/2015   Chronic atrophy and small vessel ischemic changes. Slight asymmetry of white matter changes on the right. Can't exclude acute ischemia. No mass effect or midline shift. No acute intracranial hemorrhage.     [name] Head/brain Wo Cm 08/16/2015    MRI HEAD:  Equivocal findings for subcentimeter acute infarct RIGHT occipital lobe. Stable moderate to severe chronic small vessel ischemic disease and old RIGHT corona radiata/basal ganglia lacunar infarct.   MRA HEAD:  No acute large vessel occlusion or high-grade stenosis. 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 Chronically occluded LEFT vertebral artery, with focal moderate to high-grade stenosis proximal basilar artery. Stable at least moderate stenosis proximal RIGHT V4 segment. Luminal irregularity of the intracranial vessels compatible with atherosclerosis.    PHYSICAL EXAM HEENT- Normocephalic, no lesions, without obvious abnormality. Normal external eye and conjunctiva. Normal TM's bilaterally. Normal auditory canals and external ears. Normal external nose, mucus membranes and septum. Normal pharynx. Neck supple with no masses, nodes, nodules or enlargement. Cardiovascular - regular rate and rhythm, S1, S2 normal,  no murmur, click, rub or gallop Lungs - chest clear, no wheezing, rales, normal symmetric air entry Abdomen - soft, non-tender; bowel sounds normal; no masses, no organomegaly Extremities - no joint deformities, effusion, or inflammation and no edema  Neurologic Examination: Mental Status: Alert, oriented, thought content appropriate. Speech fluent without evidence of aphasia. Able to follow  commands without difficulty. Cranial Nerves: II-Visual fields were normal. III/IV/VI-Pupils were equal and reacted normally to light. Extraocular movements were full and conjugate.  V/VII-no facial numbness and no facial weakness. VIII-normal. X-normal speech and symmetrical palatal movement. XI: trapezius strength/neck flexion strength normal bilaterally XII-midline tongue extension with normal strength. Motor: 5/5 bilaterally with normal tone and bulk Sensory: Normal throughout. Deep Tendon Reflexes: 2+ and symmetric. Plantars: Mute bilaterally Cerebellar: Normal finger-to-nose testing. Carotid auscultation: Normal   ASSESSMENT/PLAN [name] is a 82 y.o. female with history of Coronary artery disease, hyperlipidemia, lung nodules, carotid bruit, obstructive sleep apnea, previous strokes, and congestive heart failure presenting with left facial droop and left visual field change.  She did not receive IV t-PA due to resolution of deficits.  Equivocal stroke:  Non-dominant subcentimeter acute infarct right occipital lobe possibly embolic from basilar artery.  Resultant transient eft visual field change and left face weakness  MRI  equivocal findings for subcentimeter acute infarct RIGHT occipital lobe with old infarcts as noted above.  MRA  left ICA aneurysm, chronically occluded left vertebral artery, and stenosis of the proximal basilar artery.  Carotid Doppler 1-39% ICA stenosis. Vertebral artery flow is antegrade. Left vertebral waveform is atypical.  2D Echo - EF 60-65%. No cardiac source of emboli identified.  LDL 75  HgbA1c pending  VTE prophylaxis -  Diet regular Room service appropriate?: Yes; Fluid consistency:: Thin  aspirin 81 mg daily and clopidogrel 75 mg daily prior to admission, now on aspirin 81 mg daily and clopidogrel 75 mg daily  Patient counseled to be compliant with her antithrombotic medications  Ongoing aggressive stroke risk factor  management  Therapy recommendations: Home health physical therapy recommended  Disposition: Pending  Hypertension  Stable  Permissive hypertension (OK if < 220/120) but gradually normalize in 5-7 days  Hyperlipidemia  Home meds:  Crestor 20 mg daily resumed in hospital  LDL 75, goal < 70  Continue statin at discharge    Other Stroke Risk Factors  Advanced age  Obesity, Body mass index is 32.14 kg/(m^2).   Hx stroke/TIA  Coronary artery disease    Other Active Problems  Relatively stable 7 x 4 mm LEFT supraclinoid internal carotid artery aneurysm.  Stroke team will follow patient  Check labs in a.m.  The stroke team will sign off at this time. Please call if we can be of further service  Follow-up in the office with Dr. Erlinda Hong in 2 months  ATTENDING NOTE: Patient was seen and examined by me personally. Documentation reflects findings. The laboratory and radiographic studies reviewed by me. ROS completed by me personally and pertinent positives fully documented   Condition: stable  Assessment and plan completed by me personally and fully documented above. Plans/Recommendations include:     Stroke work up completed and documented in medical record  Patient for discharge once work up completed  SIGNED BY: Dr. Elissa Hefty    To contact Stroke Continuity provider, please refer to [email or web address] After hours, contact General Neurology

## 2015-08-19 LAB — BASIC METABOLIC PANEL
ANION GAP: 8 (ref 5–15)
BUN: 12 mg/dL (ref 6–20)
CHLORIDE: 102 mmol/L (ref 101–111)
CO2: 26 mmol/L (ref 22–32)
Calcium: 8.7 mg/dL — ABNORMAL LOW (ref 8.9–10.3)
Creatinine, Ser: 0.48 mg/dL (ref 0.44–1.00)
GFR calc Af Amer: >60 mL/min (ref >60)
GFR calc non Af Amer: >60 mL/min (ref >60)
GLUCOSE: 103 mg/dL — AB (ref 65–99)
POTASSIUM: 4.1 mmol/L (ref 3.5–5.1)
Sodium: 136 mmol/L (ref 135–145)

## 2015-08-19 LAB — CBC
HEMATOCRIT: 33.8 % — AB (ref 36.0–46.0)
HEMOGLOBIN: 10.9 g/dL — AB (ref 12.0–15.0)
MCH: 30.1 pg (ref 26.0–34.0)
MCHC: 32.2 g/dL (ref 30.0–36.0)
MCV: 93.4 fL (ref 78.0–100.0)
Platelets: 205 10*3/uL (ref 150–400)
RBC: 3.62 MIL/uL — AB (ref 3.87–5.11)
RDW: 12.6 % (ref 11.5–15.5)
WBC: 7.4 10*3/uL (ref 4.0–10.5)

## 2015-08-19 LAB — HEMOGLOBIN A1C
HEMOGLOBIN A1C: 6.2 % — AB (ref 4.8–5.6)
Mean Plasma Glucose: 131 mg/dL

## 2015-08-19 NOTE — Care Management Note (Signed)
Case Management Note  Patient Details  Name: Gina Bowers MRN: [id number] Date of Birth: 12-18-1925  Subjective/Objective:                    Action/Plan: Patient discharging home today with orders for HHPT. CM met with the patient and her caregiver and they state they would like to use [facility]. Suzanna with [facility] notified and accepted the referral. Information she requested was faxed to her at [phone], the number she provided. Bedside RN updated.   Expected Discharge Date:                  Expected Discharge Plan:  [place]  In-House Referral:     Discharge planning Services  CM Consult  Post Acute Care Choice:  Home Health Choice offered to:  Patient  DME Arranged:    DME Agency:     HH Arranged:  PT [place]:  [place]  Status of Service:  Completed, signed off  Medicare Important Message Given:    Date Medicare IM Given:    Medicare IM give by:    Date Additional Medicare IM Given:    Additional Medicare Important Message give by:     If discussed at [place] of Stay Meetings, dates discussed:    Additional Comments:  Pollie Friar, RN 08/19/2015, 3:34 PM

## 2015-08-19 NOTE — Progress Notes (Signed)
Discharge orders received, Pt for discharge home today with home health PT per [place]. IV d/c'd. D/c instructions and RX given with verbalized understanding. Family at bedside to assist patient with discharge. Staff bought pt downstairs via wheelchair. 08/19/15 1618.

## 2015-08-19 NOTE — Discharge Instructions (Signed)
Transient Ischemic Attack °A transient ischemic attack (TIA) is a "warning stroke" that causes stroke-like symptoms. Unlike a stroke, a TIA does not cause permanent damage to the brain. The symptoms of a TIA can happen very fast and do not last long. It is important to know the symptoms of a TIA and what to do. This can help prevent a major stroke or death. °CAUSES  °A TIA is caused by a temporary blockage in an artery in the brain or neck (carotid artery). The blockage does not allow the brain to get the blood supply it needs and can cause different symptoms. The blockage can be caused by either: °· A blood clot. °· Fatty buildup (plaque) in a neck or brain artery. °RISK FACTORS °· High blood pressure (hypertension). °· High cholesterol. °· Diabetes mellitus. °· Heart disease. °· The buildup of plaque in the blood vessels (peripheral artery disease or atherosclerosis). °· The buildup of plaque in the blood vessels that provide blood and oxygen to the brain (carotid artery stenosis). °· An abnormal heart rhythm (atrial fibrillation). °· Obesity. °· Using any tobacco products, including cigarettes, chewing tobacco, or electronic cigarettes. °· Taking oral contraceptives, especially in combination with using tobacco. °· Physical inactivity. °· A diet high in fats, salt (sodium), and calories. °· Excessive alcohol use. °· Use of illegal drugs (especially cocaine and methamphetamine). °· Being female. °· Being African American. °· Being over the age of 55 years. °· Family history of stroke. °· Previous history of blood clots, stroke, TIA, or heart attack. °· Sickle cell disease. °SIGNS AND SYMPTOMS  °TIA symptoms are the same as a stroke but are temporary. These symptoms usually develop suddenly, or may be newly present upon waking from sleep: °· Sudden weakness or numbness of the face, arm, or leg, especially on one side of the body. °· Sudden trouble walking or difficulty moving arms or legs. °· Sudden  confusion. °· Sudden personality changes. °· Trouble speaking (aphasia) or understanding. °· Difficulty swallowing. °· Sudden trouble seeing in one or both eyes. °· Double vision. °· Dizziness. °· Loss of balance or coordination. °· Sudden severe headache with no known cause. °· Trouble reading or writing. °· Loss of bowel or bladder control. °· Loss of consciousness. °DIAGNOSIS  °Your health care provider may be able to determine the presence or absence of a TIA based on your symptoms, history, and physical exam. CT scan of the brain is usually performed to help identify a TIA. Other tests may include: °· Electrocardiography (ECG). °· Continuous heart monitoring. °· Echocardiography. °· Carotid ultrasonography. °· MRI. °· A scan of the brain circulation. °· Blood tests. °TREATMENT  °Since the symptoms of TIA are the same as a stroke, it is important to seek treatment as soon as possible. You may need a medicine to dissolve a blood clot (thrombolytic) if that is the cause of the TIA. This medicine cannot be given if too much time has passed. Treatment may also include:  °· Rest, oxygen, fluids through an IV tube, and medicines to thin the blood (anticoagulants). °· Measures will be taken to prevent short-term and long-term complications, including infection from breathing foreign material into the lungs (aspiration pneumonia), blood clots in the legs, and falls. °· Procedures to either remove plaque in the carotid arteries or dilate carotid arteries that have narrowed due to plaque. Those procedures are: °¨ Carotid endarterectomy. °¨ Carotid angioplasty and stenting. °· Medicines and diet may be used to address diabetes, high blood pressure, and   other underlying risk factors. °HOME CARE INSTRUCTIONS  °· Take medicines only as directed by your health care provider. Follow the directions carefully. Medicines may be used to control risk factors for a stroke. Be sure you understand all your medicine instructions. °· You  may be told to take aspirin or the anticoagulant warfarin. Warfarin needs to be taken exactly as instructed. °¨ Taking too much or too little warfarin is dangerous. Too much warfarin increases the risk of bleeding. Too little warfarin continues to allow the risk for blood clots. While taking warfarin, you will need to have regular blood tests to measure your blood clotting time. A PT blood test measures how long it takes for blood to clot. Your PT is used to calculate another value called an INR. Your PT and INR help your health care provider to adjust your dose of warfarin. The dose can change for many reasons. It is critically important that you take warfarin exactly as prescribed. °¨ Many foods, especially foods high in vitamin K can interfere with warfarin and affect the PT and INR. Foods high in vitamin K include spinach, kale, broccoli, cabbage, collard and turnip greens, Brussels sprouts, peas, cauliflower, seaweed, and parsley, as well as beef and pork liver, green tea, and soybean oil. You should eat a consistent amount of foods high in vitamin K. Avoid major changes in your diet, or notify your health care provider before changing your diet. Arrange a visit with a dietitian to answer your questions. °¨ Many medicines can interfere with warfarin and affect the PT and INR. You must tell your health care provider about any and all medicines you take; this includes all vitamins and supplements. Be especially cautious with aspirin and anti-inflammatory medicines. Do not take or discontinue any prescribed or over-the-counter medicine except on the advice of your health care provider or pharmacist. °¨ Warfarin can have side effects, such as excessive bruising or bleeding. You will need to hold pressure over cuts for longer than usual. Your health care provider or pharmacist will discuss other potential side effects. °¨ Avoid sports or activities that may cause injury or bleeding. °¨ Be careful when shaving,  flossing your teeth, or handling sharp objects. °¨ Alcohol can change the body's ability to handle warfarin. It is best to avoid alcoholic drinks or consume only very small amounts while taking warfarin. Notify your health care provider if you change your alcohol intake. °¨ Notify your dentist or other health care providers before procedures. °· Eat a diet that includes 5 or more servings of fruits and vegetables each day. This may reduce the risk of stroke. Certain diets may be prescribed to address high blood pressure, high cholesterol, diabetes, or obesity. °¨ A diet low in sodium, saturated fat, trans fat, and cholesterol is recommended to manage high blood pressure. °¨ A diet low in saturated fat, trans fat, and cholesterol, and high in fiber may control cholesterol levels. °¨ A controlled-carbohydrate, controlled-sugar diet is recommended to manage diabetes. °¨ A reduced-calorie diet that is low in sodium, saturated fat, trans fat, and cholesterol is recommended to manage obesity. °· Maintain a healthy weight. °· Stay physically active. It is recommended that you get at least 30 minutes of activity on most or all days. °· Do not use any tobacco products, including cigarettes, chewing tobacco, or electronic cigarettes. If you need help quitting, ask your health care provider. °· Limit alcohol intake to no more than 1 drink per day for nonpregnant women and 2 drinks   per day for men. One drink equals 12 ounces of beer, 5 ounces of wine, or 1½ ounces of hard liquor. °· Do not abuse drugs. °· A safe home environment is important to reduce the risk of falls. Your health care provider may arrange for specialists to evaluate your home. Having grab bars in the bedroom and bathroom is often important. Your health care provider may arrange for equipment to be used at home, such as raised toilets and a seat for the shower. °· Follow all instructions for follow-up with your health care provider. This is very important.  This includes any referrals and lab tests. Proper follow-up can prevent a stroke or another TIA from occurring. °PREVENTION  °The risk of a TIA can be decreased by appropriately treating high blood pressure, high cholesterol, diabetes, heart disease, and obesity, and by quitting smoking, limiting alcohol, and staying physically active. °SEEK MEDICAL CARE IF: °· You have personality changes. °· You have difficulty swallowing. °· You are seeing double. °· You have dizziness. °· You have a fever. °SEEK IMMEDIATE MEDICAL CARE IF:  °Any of the following symptoms may represent a serious problem that is an emergency. Do not wait to see if the symptoms will go away. Get medical help right away. Call your local emergency services (911 in [place] Do not drive yourself to the hospital. °· You have sudden weakness or numbness of the face, arm, or leg, especially on one side of the body. °· You have sudden trouble walking or difficulty moving arms or legs. °· You have sudden confusion. °· You have trouble speaking (aphasia) or understanding. °· You have sudden trouble seeing in one or both eyes. °· You have a loss of balance or coordination. °· You have a sudden, severe headache with no known cause. °· You have new chest pain or an irregular heartbeat. °· You have a partial or total loss of consciousness. °MAKE SURE YOU:  °· Understand these instructions. °· Will watch your condition. °· Will get help right away if you are not doing well or get worse. °  °This information is not intended to replace advice given to you by your health care provider. Make sure you discuss any questions you have with your health care provider. °  °Document Released: 02/18/2005 Document Revised: 06/01/2014 Document Reviewed: 08/16/2013 °Elsevier Interactive Patient Education ©2016 [employer] ° °

## 2015-08-19 NOTE — Progress Notes (Signed)
Physical Therapy Treatment Patient Details Name: Gina Bowers MRN: [id number] DOB: 03/08/1926 Today's Date: 08/19/2015    History of Present Illness Gina Bowers is a 82 y.o. female with h/o CVA, TIA, CAD. Patient presents to the ED with acute onset of L visual field changes and left facial droop. Symptoms onset 4:30 pm today, have resolved at this point. CT head is negative. No weakness nor numbness of extremities.    PT Comments    Patient feels she is very near her baseline and eager to go home. She denies visual changes, although states "something doesn't feel right about my eye...but I can see fine." No issues with vision durign mobility (able to maneuver RW around obstacles in room and bathroom.    Follow Up Recommendations  Home health PT;Supervision/Assistance - 24 hour (her aide present and reports with family 24/7 x 2 days)     Equipment Recommendations  None recommended by PT    Recommendations for Other Services       Precautions / Restrictions Precautions Precautions: Fall Precaution Comments: L visual eye changes Restrictions Weight Bearing Restrictions: No    Mobility  Bed Mobility Overal bed mobility: Modified Independent             General bed mobility comments: increased time, more labored effort than normal but no external assist needed  Transfers Overall transfer level: Needs assistance Equipment used: Rolling walker (2 wheeled) Transfers: Sit to/from Stand Sit to Stand: Min guard         General transfer comment: pt accustomed to locking rollator and using handles to come to stand  Ambulation/Gait Ambulation/Gait assistance: Supervision Ambulation Distance (Feet): 160 Feet Assistive device: Rolling walker (2 wheeled) Gait Pattern/deviations: Step-through pattern;Decreased stride length;Trunk flexed Gait velocity: wfl for age   General Gait Details: pt tends to push RW too far ahead of her (again accustomed to rollator and using  a 2 wheel RW provided by hospital)   Stairs            Wheelchair Mobility    Modified Rankin (Stroke Patients Only) Modified Rankin (Stroke Patients Only) Pre-Morbid Rankin Score: Moderate disability Modified Rankin: Moderate disability     Balance Overall balance assessment: Needs assistance Sitting-balance support: Feet supported;No upper extremity supported Sitting balance-Leahy Scale: Good     Standing balance support: No upper extremity supported Standing balance-Leahy Scale: Fair                      Cognition Arousal/Alertness: Awake/alert Behavior During Therapy: WFL for tasks assessed/performed Overall Cognitive Status: Within Functional Limits for tasks assessed                      Exercises      General Comments General comments (skin integrity, edema, etc.): assisted pt into bathroom and pt indep with tolieting      Pertinent Vitals/Pain Pain Assessment: No/denies pain    Home Living                      Prior Function            PT Goals (current goals can now be found in the care plan section) Acute Rehab PT Goals Patient Stated Goal: home soon PT Goal Formulation: With patient Time For Goal Achievement: 08/24/15 Potential to Achieve Goals: Good Progress towards PT goals: Progressing toward goals    Frequency  Min 3X/week    PT Plan Current plan  remains appropriate    Co-evaluation             End of Session Equipment Utilized During Treatment: Gait belt Activity Tolerance: Patient tolerated treatment well Patient left: in chair;with call bell/phone within reach;with chair alarm set     Time: [id number] PT Time Calculation (min) (ACUTE ONLY): 22 min  Charges:  $Gait Training: 8-22 mins                    G Codes:      Harlem Bula 2015/08/30, 12:12 PM  Pager [phone]

## 2015-08-21 DIAGNOSIS — M21372 Foot drop, left foot: Secondary | ICD-10-CM | POA: Diagnosis not present

## 2015-08-21 DIAGNOSIS — I69354 Hemiplegia and hemiparesis following cerebral infarction affecting left non-dominant side: Secondary | ICD-10-CM | POA: Diagnosis not present

## 2015-08-21 DIAGNOSIS — I509 Heart failure, unspecified: Secondary | ICD-10-CM | POA: Diagnosis not present

## 2015-08-21 DIAGNOSIS — I110 Hypertensive heart disease with heart failure: Secondary | ICD-10-CM | POA: Diagnosis not present

## 2015-08-28 DIAGNOSIS — R05 Cough: Secondary | ICD-10-CM | POA: Diagnosis not present

## 2015-08-29 ENCOUNTER — Other Ambulatory Visit: Payer: Self-pay | Admitting: *Deleted

## 2015-08-30 DIAGNOSIS — J209 Acute bronchitis, unspecified: Secondary | ICD-10-CM | POA: Diagnosis not present

## 2015-08-30 DIAGNOSIS — I10 Essential (primary) hypertension: Secondary | ICD-10-CM | POA: Diagnosis not present

## 2015-08-30 DIAGNOSIS — G459 Transient cerebral ischemic attack, unspecified: Secondary | ICD-10-CM | POA: Diagnosis not present

## 2015-09-17 ENCOUNTER — Ambulatory Visit
Admission: RE | Admit: 2015-09-17 | Discharge: 2015-09-17 | Disposition: A | Discharge status: Home / Self Care | Payer: Medicare Other | Source: Ambulatory Visit | Attending: Internal Medicine | Admitting: Internal Medicine

## 2015-09-17 ENCOUNTER — Other Ambulatory Visit: Payer: Self-pay | Admitting: Internal Medicine

## 2015-09-17 DIAGNOSIS — R059 Cough, unspecified: Secondary | ICD-10-CM

## 2015-09-17 DIAGNOSIS — R0602 Shortness of breath: Secondary | ICD-10-CM | POA: Diagnosis not present

## 2015-09-17 DIAGNOSIS — R05 Cough: Secondary | ICD-10-CM

## 2015-09-23 DIAGNOSIS — M25552 Pain in left hip: Secondary | ICD-10-CM | POA: Diagnosis not present

## 2015-09-26 NOTE — Discharge Summary (Addendum)
Physician Discharge Summary  Patient ID: Gina Bowers MRN: [id number] DOB/AGE: 05-25-1926 82 y.o.  Admit date: 08/16/2015 Discharge date: 08/19/2015  Admission Diagnoses: TIA  Discharge Diagnoses:  Principal Problem:   TIA (transient ischemic attack) Active Problems:   Possible Renovascular hypertension   Hyperlipidemia   Acute CVA (cerebrovascular accident) [facility])   Discharged Condition: good  IMAGING  Ct Head Wo Contrast 08/16/2015  Chronic atrophy and small vessel ischemic changes. Slight asymmetry of white matter changes on the right. Can't exclude acute ischemia. No mass effect or midline shift. No acute intracranial hemorrhage.     Mr Gina Bowers Head/brain Wo Cm 08/16/2015   MRI HEAD:  Equivocal findings for subcentimeter acute infarct RIGHT occipital lobe. Stable moderate to severe chronic small vessel ischemic disease and old RIGHT corona radiata/basal ganglia lacunar infarct.   MRA HEAD:  No acute large vessel occlusion or high-grade stenosis. 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 Chronically occluded LEFT vertebral artery, with focal moderate to high-grade stenosis proximal basilar artery. Stable at least moderate stenosis proximal RIGHT V4 segment. Luminal irregularity of the intracranial vessels compatible with atherosclerosis.    D/C PHYSICAL EXAM HEENT- Normocephalic, no lesions, without obvious abnormality. Normal external eye and conjunctiva. Normal TM's bilaterally. Normal auditory canals and external ears. Normal external nose, mucus membranes and septum. Normal pharynx. Neck supple with no masses, nodes, nodules or enlargement. Cardiovascular - regular rate and rhythm, S1, S2 normal, no murmur, click, rub or gallop Lungs - chest clear, no wheezing, rales, normal symmetric air entry Abdomen - soft, non-tender; bowel sounds normal; no masses, no organomegaly Extremities - no joint deformities, effusion, or  inflammation and no edema  Neurologic Examination: Mental Status: Alert, oriented, thought content appropriate. Speech fluent without evidence of aphasia. Able to follow commands without difficulty. Cranial Nerves: II-Visual fields were normal. III/IV/VI-Pupils were equal and reacted normally to light. Extraocular movements were full and conjugate.  V/VII-no facial numbness and no facial weakness. VIII-normal. X-normal speech and symmetrical palatal movement. XI: trapezius strength/neck flexion strength normal bilaterally XII-midline tongue extension with normal strength. Motor: 5/5 bilaterally with normal tone and bulk Sensory: Normal throughout. Deep Tendon Reflexes: 2+ and symmetric. Plantars: Mute bilaterally Cerebellar: Normal finger-to-nose testing. Carotid auscultation: Normal   Hospital Course Gina Bowers is a 82 y.o. female with history of Coronary artery disease, hyperlipidemia, lung nodules, carotid bruit, obstructive sleep apnea, previous strokes, and congestive heart failure presenting with left facial droop and left visual field change. She did not receive IV t-PA due to resolution of deficits.  Equivocal stroke: Non-dominant subcentimeter acute infarct right occipital lobe possibly embolic from basilar artery.  Resultant transient eft visual field change and left face weakness  MRI equivocal findings for subcentimeter acute infarct RIGHT occipital lobe with old infarcts as noted above.  MRA left ICA aneurysm, chronically occluded left vertebral artery, and stenosis of the proximal basilar artery.  Carotid Doppler 1-39% ICA stenosis. Vertebral artery flow is antegrade. Left vertebral waveform is atypical.  2D Echo - EF 60-65%. No cardiac source of emboli identified.  LDL 75  HgbA1c pending at D/C  VTE prophylaxis -  Diet regular Room service appropriate?: Yes; Fluid consistency:: Thin  aspirin 81 mg daily and clopidogrel 75 mg daily prior to  admission, now on aspirin 81 mg daily and clopidogrel 75 mg daily  Patient counseled to be compliant with her antithrombotic medications  Ongoing aggressive stroke risk factor management  Therapy recommendations: Home health physical  therapy recommended & arranged  Disposition:  Consults: neurology  Discharge Exam Vitals: Blood pressure 119/56, pulse 69, temperature 98.2 F (36.8 C), temperature source Oral, resp. rate 18, height 5\' 1"  (1.549 m), weight 77.111 kg (170 lb), SpO2 95 %.   Disposition: 01-Home or Self Care  Discharge Instructions    Ambulatory referral to Neurology    Complete by:  As directed   Dr. Erlinda Hong requests follow up for this patient in 2 months.            Medication List    TAKE these medications        alendronate 70 MG tablet  Commonly known as:  FOSAMAX  Take 1 tablet (70 mg total) by mouth once a week. Take with a full glass of water on an empty stomach.     aspirin EC 81 MG tablet  Take 81 mg by mouth every evening.     CALCIUM 1000 + D PO  Take 2 tablets by mouth daily.     cephALEXin 250 MG capsule  Commonly known as:  KEFLEX  Take 1 capsule (250 mg total) by mouth daily.     clopidogrel 75 MG tablet  Commonly known as:  PLAVIX  Take 1 tablet (75 mg total) by mouth daily.     CRANBERRY EXTRACT PO  Take 500 mg by mouth daily.     fish oil-omega-3 fatty acids 1000 MG capsule  Take 1 g by mouth daily with breakfast.     levothyroxine 75 MCG tablet  Commonly known as:  SYNTHROID, LEVOTHROID  Take 1 tablet (75 mcg total) by mouth daily before breakfast.     lisinopril 5 MG tablet  Commonly known as:  PRINIVIL,ZESTRIL  Take 1 tablet (5 mg total) by mouth daily.     meclizine 25 MG tablet  Commonly known as:  ANTIVERT  Take 25 mg by mouth 3 (three) times daily as needed for dizziness.     mirabegron ER 50 MG Tb24 tablet  Commonly known as:  MYRBETRIQ  Take 1 tablet (50 mg total) by mouth 2 (two) times daily.     multivitamin  with minerals Tabs tablet  Take 1 tablet by mouth daily with breakfast.     ondansetron 4 MG disintegrating tablet  Commonly known as:  ZOFRAN ODT  Take one every 6 hours for nausea     OVER THE COUNTER MEDICATION  Take 2 capsules by mouth daily. Immuplex Immune Booster. Takes during the winter months     pantoprazole 40 MG tablet  Commonly known as:  PROTONIX  Take 1 tablet (40 mg total) by mouth daily.     rosuvastatin 20 MG tablet  Commonly known as:  CRESTOR  TAKE 1 TABLET (20 MG TOTAL) BY MOUTH EVERY EVENING.     sertraline 50 MG tablet  Commonly known as:  ZOLOFT  1.5 tablets by mouth every day     SYSTANE OP  Place 1 drop into both eyes 3 (three) times daily. Patient states its the Gel form. Not sure what percentage. Take every day per patient           Follow-up Information    Follow up with Xu,Jindong, MD. Schedule an appointment as soon as possible for a visit in 2 months.   Specialty:  Neurology   Contact information:   [address] [place] [address] [phone]       Follow up with Irven Shelling, MD In 2 weeks.  Specialty:  Internal Medicine   Why:  TIA   Contact information:   [address] [place] [address] [phone]       Follow up with [facility].   Specialty:  [place]   Why:  they will contact you to set up first visit   Contact information:   [place] [address] [phone]       Signed: Elwin Mocha 09/26/2015, 1:30 PM

## 2015-09-30 ENCOUNTER — Other Ambulatory Visit: Payer: Self-pay | Admitting: Cardiovascular Disease

## 2015-09-30 MED ORDER — ROSUVASTATIN CALCIUM 20 MG PO TABS: 20.0000 mg | ORAL_TABLET | Freq: Every day | ORAL | Status: DC

## 2015-09-30 NOTE — Telephone Encounter (Signed)
Rx request sent to pharmacy.  

## 2015-10-03 DIAGNOSIS — Z66 Do not resuscitate: Secondary | ICD-10-CM | POA: Diagnosis present

## 2015-10-03 DIAGNOSIS — Z882 Allergy status to sulfonamides status: Secondary | ICD-10-CM

## 2015-10-03 DIAGNOSIS — R0602 Shortness of breath: Secondary | ICD-10-CM | POA: Diagnosis not present

## 2015-10-03 DIAGNOSIS — N390 Urinary tract infection, site not specified: Secondary | ICD-10-CM | POA: Diagnosis not present

## 2015-10-03 DIAGNOSIS — I739 Peripheral vascular disease, unspecified: Secondary | ICD-10-CM | POA: Diagnosis present

## 2015-10-03 DIAGNOSIS — I25710 Atherosclerosis of autologous vein coronary artery bypass graft(s) with unstable angina pectoris: Secondary | ICD-10-CM | POA: Diagnosis not present

## 2015-10-03 DIAGNOSIS — E785 Hyperlipidemia, unspecified: Secondary | ICD-10-CM | POA: Diagnosis present

## 2015-10-03 DIAGNOSIS — G459 Transient cerebral ischemic attack, unspecified: Secondary | ICD-10-CM | POA: Diagnosis not present

## 2015-10-03 DIAGNOSIS — Z7902 Long term (current) use of antithrombotics/antiplatelets: Secondary | ICD-10-CM

## 2015-10-03 DIAGNOSIS — B952 Enterococcus as the cause of diseases classified elsewhere: Secondary | ICD-10-CM | POA: Diagnosis present

## 2015-10-03 DIAGNOSIS — H539 Unspecified visual disturbance: Secondary | ICD-10-CM | POA: Diagnosis not present

## 2015-10-03 DIAGNOSIS — Z955 Presence of coronary angioplasty implant and graft: Secondary | ICD-10-CM

## 2015-10-03 DIAGNOSIS — R0789 Other chest pain: Secondary | ICD-10-CM | POA: Diagnosis not present

## 2015-10-03 DIAGNOSIS — I708 Atherosclerosis of other arteries: Secondary | ICD-10-CM | POA: Diagnosis not present

## 2015-10-03 DIAGNOSIS — E039 Hypothyroidism, unspecified: Secondary | ICD-10-CM | POA: Diagnosis present

## 2015-10-03 DIAGNOSIS — I119 Hypertensive heart disease without heart failure: Secondary | ICD-10-CM | POA: Diagnosis not present

## 2015-10-03 DIAGNOSIS — Z7982 Long term (current) use of aspirin: Secondary | ICD-10-CM

## 2015-10-03 DIAGNOSIS — Z8673 Personal history of transient ischemic attack (TIA), and cerebral infarction without residual deficits: Secondary | ICD-10-CM

## 2015-10-03 DIAGNOSIS — R32 Unspecified urinary incontinence: Secondary | ICD-10-CM | POA: Diagnosis present

## 2015-10-03 DIAGNOSIS — Z951 Presence of aortocoronary bypass graft: Secondary | ICD-10-CM

## 2015-10-03 DIAGNOSIS — R079 Chest pain, unspecified: Secondary | ICD-10-CM | POA: Diagnosis not present

## 2015-10-04 ENCOUNTER — Emergency Department (HOSPITAL_COMMUNITY): Payer: Medicare Other

## 2015-10-04 ENCOUNTER — Inpatient Hospital Stay (HOSPITAL_COMMUNITY)
Admission: EM | Admit: 2015-10-04 | Discharge: 2015-10-09 | DRG: 287 | Disposition: A | Discharge status: Home Health | Payer: Medicare Other | Attending: Cardiovascular Disease | Admitting: Cardiovascular Disease

## 2015-10-04 ENCOUNTER — Encounter (HOSPITAL_COMMUNITY): Payer: Self-pay | Admitting: Emergency Medicine

## 2015-10-04 DIAGNOSIS — I200 Unstable angina: Secondary | ICD-10-CM | POA: Diagnosis present

## 2015-10-04 DIAGNOSIS — I119 Hypertensive heart disease without heart failure: Secondary | ICD-10-CM | POA: Diagnosis not present

## 2015-10-04 DIAGNOSIS — Z951 Presence of aortocoronary bypass graft: Secondary | ICD-10-CM | POA: Diagnosis not present

## 2015-10-04 DIAGNOSIS — E039 Hypothyroidism, unspecified: Secondary | ICD-10-CM | POA: Diagnosis not present

## 2015-10-04 DIAGNOSIS — N390 Urinary tract infection, site not specified: Secondary | ICD-10-CM | POA: Diagnosis present

## 2015-10-04 DIAGNOSIS — R079 Chest pain, unspecified: Secondary | ICD-10-CM | POA: Insufficient documentation

## 2015-10-04 DIAGNOSIS — Z955 Presence of coronary angioplasty implant and graft: Secondary | ICD-10-CM | POA: Diagnosis present

## 2015-10-04 DIAGNOSIS — R5383 Other fatigue: Secondary | ICD-10-CM | POA: Diagnosis present

## 2015-10-04 DIAGNOSIS — I208 Other forms of angina pectoris: Secondary | ICD-10-CM

## 2015-10-04 DIAGNOSIS — I5189 Other ill-defined heart diseases: Secondary | ICD-10-CM | POA: Diagnosis present

## 2015-10-04 DIAGNOSIS — I25810 Atherosclerosis of coronary artery bypass graft(s) without angina pectoris: Secondary | ICD-10-CM

## 2015-10-04 DIAGNOSIS — Z8673 Personal history of transient ischemic attack (TIA), and cerebral infarction without residual deficits: Secondary | ICD-10-CM

## 2015-10-04 DIAGNOSIS — I708 Atherosclerosis of other arteries: Secondary | ICD-10-CM | POA: Diagnosis not present

## 2015-10-04 DIAGNOSIS — I10 Essential (primary) hypertension: Secondary | ICD-10-CM | POA: Diagnosis not present

## 2015-10-04 DIAGNOSIS — I209 Angina pectoris, unspecified: Secondary | ICD-10-CM | POA: Diagnosis not present

## 2015-10-04 DIAGNOSIS — G459 Transient cerebral ischemic attack, unspecified: Secondary | ICD-10-CM | POA: Diagnosis present

## 2015-10-04 DIAGNOSIS — I7100 Dissection of unspecified site of aorta: Secondary | ICD-10-CM

## 2015-10-04 DIAGNOSIS — E785 Hyperlipidemia, unspecified: Secondary | ICD-10-CM | POA: Diagnosis present

## 2015-10-04 DIAGNOSIS — R0602 Shortness of breath: Secondary | ICD-10-CM | POA: Diagnosis not present

## 2015-10-04 DIAGNOSIS — R0789 Other chest pain: Secondary | ICD-10-CM

## 2015-10-04 HISTORY — DX: Chronic diastolic (congestive) heart failure: I50.32

## 2015-10-04 LAB — COMPREHENSIVE METABOLIC PANEL
ALBUMIN: 3.4 g/dL — AB (ref 3.5–5.0)
ALK PHOS: 52 U/L (ref 38–126)
ALT: 22 U/L (ref 14–54)
ANION GAP: 11 (ref 5–15)
AST: 21 U/L (ref 15–41)
BUN: 22 mg/dL — ABNORMAL HIGH (ref 6–20)
CALCIUM: 9.3 mg/dL (ref 8.9–10.3)
CO2: 25 mmol/L (ref 22–32)
Chloride: 101 mmol/L (ref 101–111)
Creatinine, Ser: 0.61 mg/dL (ref 0.44–1.00)
GFR calc Af Amer: >60 mL/min (ref >60)
GFR calc non Af Amer: >60 mL/min (ref >60)
GLUCOSE: 108 mg/dL — AB (ref 65–99)
Potassium: 4.2 mmol/L (ref 3.5–5.1)
SODIUM: 137 mmol/L (ref 135–145)
Total Bilirubin: 0.3 mg/dL (ref 0.3–1.2)
Total Protein: 6.4 g/dL — ABNORMAL LOW (ref 6.5–8.1)

## 2015-10-04 LAB — CBC WITH DIFFERENTIAL/PLATELET
BASOS ABS: 0 10*3/uL (ref 0.0–0.1)
BASOS PCT: 0 %
EOS ABS: 0.2 10*3/uL (ref 0.0–0.7)
Eosinophils Relative: 2 %
HCT: 38.7 % (ref 36.0–46.0)
HEMOGLOBIN: 12.4 g/dL (ref 12.0–15.0)
Lymphocytes Relative: 37 %
Lymphs Abs: 3.6 10*3/uL (ref 0.7–4.0)
MCH: 29.7 pg (ref 26.0–34.0)
MCHC: 32 g/dL (ref 30.0–36.0)
MCV: 92.8 fL (ref 78.0–100.0)
Monocytes Absolute: 0.6 10*3/uL (ref 0.1–1.0)
Monocytes Relative: 7 %
NEUTROS PCT: 54 %
Neutro Abs: 5.3 10*3/uL (ref 1.7–7.7)
Platelets: 260 10*3/uL (ref 150–400)
RBC: 4.17 MIL/uL (ref 3.87–5.11)
RDW: 13 % (ref 11.5–15.5)
WBC: 9.8 10*3/uL (ref 4.0–10.5)

## 2015-10-04 LAB — TROPONIN I
Troponin I: <0.03 ng/mL (ref <0.031)
Troponin I: <0.03 ng/mL (ref <0.031)

## 2015-10-04 LAB — I-STAT TROPONIN, ED: Troponin i, poc: 0 ng/mL (ref 0.00–0.08)

## 2015-10-04 LAB — HEPARIN LEVEL (UNFRACTIONATED): Heparin Unfractionated: 0.34 IU/mL (ref 0.30–0.70)

## 2015-10-04 MED ORDER — LOSARTAN POTASSIUM 25 MG PO TABS
25.0000 mg | ORAL_TABLET | Freq: Every day | ORAL | Status: DC
  Administered 2015-10-04 – 2015-10-09 (×6): 25 mg via ORAL
  Filled 2015-10-04 (×6): qty 1

## 2015-10-04 MED ORDER — PANTOPRAZOLE SODIUM 40 MG PO TBEC
40.0000 mg | DELAYED_RELEASE_TABLET | Freq: Every day | ORAL | Status: DC
  Administered 2015-10-04 – 2015-10-09 (×6): 40 mg via ORAL
  Filled 2015-10-04 (×6): qty 1

## 2015-10-04 MED ORDER — SERTRALINE HCL 50 MG PO TABS
50.0000 mg | ORAL_TABLET | Freq: Every day | ORAL | Status: DC
  Administered 2015-10-04 – 2015-10-09 (×6): 50 mg via ORAL
  Filled 2015-10-04 (×6): qty 1

## 2015-10-04 MED ORDER — IOPAMIDOL (ISOVUE-300) INJECTION 61%
INTRAVENOUS | Status: AC
  Filled 2015-10-04: qty 100

## 2015-10-04 MED ORDER — NITROGLYCERIN 2 % TD OINT
1.0000 [in_us] | TOPICAL_OINTMENT | Freq: Once | TRANSDERMAL | Status: AC
  Administered 2015-10-04: 1 [in_us] via TOPICAL
  Filled 2015-10-04: qty 1

## 2015-10-04 MED ORDER — CLOPIDOGREL BISULFATE 75 MG PO TABS
75.0000 mg | ORAL_TABLET | Freq: Every day | ORAL | Status: DC
  Administered 2015-10-04 – 2015-10-09 (×6): 75 mg via ORAL
  Filled 2015-10-04 (×6): qty 1

## 2015-10-04 MED ORDER — ACETAMINOPHEN 325 MG PO TABS
650.0000 mg | ORAL_TABLET | ORAL | Status: DC | PRN
  Administered 2015-10-07: 650 mg via ORAL
  Filled 2015-10-04: qty 2

## 2015-10-04 MED ORDER — LEVOTHYROXINE SODIUM 75 MCG PO TABS
75.0000 ug | ORAL_TABLET | Freq: Every day | ORAL | Status: DC
Start: 2015-10-04 — End: 2015-10-05
  Administered 2015-10-04: 75 ug via ORAL
  Filled 2015-10-04: qty 1

## 2015-10-04 MED ORDER — ONDANSETRON HCL 4 MG/2ML IJ SOLN: 4.0000 mg | Freq: Four times a day (QID) | INTRAMUSCULAR | Status: DC | PRN

## 2015-10-04 MED ORDER — ROSUVASTATIN CALCIUM 20 MG PO TABS
20.0000 mg | ORAL_TABLET | Freq: Every day | ORAL | Status: DC
  Administered 2015-10-04 – 2015-10-09 (×6): 20 mg via ORAL
  Filled 2015-10-04 (×7): qty 1

## 2015-10-04 MED ORDER — IOPAMIDOL (ISOVUE-370) INJECTION 76%
INTRAVENOUS | Status: AC
  Administered 2015-10-04: 100 mL
  Filled 2015-10-04: qty 100

## 2015-10-04 MED ORDER — ASPIRIN 81 MG PO CHEW
324.0000 mg | CHEWABLE_TABLET | Freq: Once | ORAL | Status: AC
  Administered 2015-10-04: 324 mg via ORAL
  Filled 2015-10-04: qty 4

## 2015-10-04 MED ORDER — HEPARIN (PORCINE) IN NACL 100-0.45 UNIT/ML-% IJ SOLN
800.0000 [IU]/h | INTRAMUSCULAR | Status: DC
  Administered 2015-10-04 (×2): 700 [IU]/h via INTRAVENOUS
  Administered 2015-10-05 – 2015-10-06 (×2): 800 [IU]/h via INTRAVENOUS
  Filled 2015-10-04 (×2): qty 250

## 2015-10-04 MED ORDER — HEPARIN BOLUS VIA INFUSION
3000.0000 [IU] | Freq: Once | INTRAVENOUS | Status: AC
  Administered 2015-10-04: 3000 [IU] via INTRAVENOUS
  Filled 2015-10-04: qty 3000

## 2015-10-04 MED ORDER — GI COCKTAIL ~~LOC~~: 30.0000 mL | Freq: Four times a day (QID) | ORAL | Status: DC | PRN

## 2015-10-04 NOTE — Progress Notes (Signed)
D/c Cardiology team and given principle problem they will take over primary. We will be available for any questions or concerns should any arise. Please call [phone].  Shaden Lacher, [employer]

## 2015-10-04 NOTE — ED Notes (Signed)
MD at bedside. 

## 2015-10-04 NOTE — Consult Note (Signed)
Cardiology Consult    Patient ID: Gina Bowers MRN: [id number], DOB/AGE: [birth year]   Admit date: 10/04/2015 Date of Consult: 10/04/2015  Primary Physician: Irven Shelling, MD Primary Cardiologist: Dr. Sallyanne Kuster  Requesting Provider: Dr. Loleta Books [facility]   Patient Profile    82 yo female with PMH of CAD s/p 4v CABG in 2009, (PCI with stent to native RCA following occlusion of the SVG to the RCA 2012), HLD/hypothyroidism/CVA/CHF/PVD who presented to the [facility] ED with chest pain.   Past Medical History   Past Medical History  Diagnosis Date  . Coronary artery disease     DR. Rollene Fare IS PT'S CARDIOLOGIST - HX CABG AND HAS STENT AND TOLD SHE HAS ANOTHER BLOCKAGE-BEING TREATED MEDICALLY  . High cholesterol   . Lung nodules     TOLD SHE HAS INTERSTITUAL LUNG DISEASE  . Shortness of breath     AND WHEEZING AT TIMES--NO INHALERS  . Vertigo   . Arthritis   . GERD (gastroesophageal reflux disease)   . Urinary incontinence   . UTI (lower urinary tract infection)     HX OF UTI'S-- LAST TIME COUPLE OF MONTHS AGO  . Hypothyroidism   . Blood transfusion 2007    AFTER HIP REPLACEMENT  . Fracture FEB 2013    FRACTURED LEFT ANKLE--NO SURGERY--WAS IN [place]OT UNTIL COUPLE WEEKS AGO  . Depression   . Carotid bruit     PT'S DAUGHTER STATES PT HAD RECENT CAROTID STUDY AND WAS TOLD NO SIGNIFICANT BLOCKAGES  . [place] virus     COUPLE OF WEEKS AGO--ALL SYMTOMS RESOLVED PER PT  . Kidney stones     IN THE PAST  . Stroke ([place])     SEVERAL STROKES -TOLD SHE HAS CEREBELLUM BLOCKAGE AS RESULT OF STROKE--BUT NEUROLOGIST SAID HE DID NOT WANT TO DO ANY PROCEDURE BECAUSE OF HER AGE .  PT HAS SLIGHT LEFT FOOT DROP-DRAGS FOOT WHEN WALKING - USES WALKER  . CAD (coronary artery disease) of artery bypass graft 06/06/2014     2009 CABG 4, Dr. Servando Snare (LIMA to LAD , SVG to diagonal, SVG to circumflex, SVG to PDA  Cath 2012 in  Recent ill,[place] patent LIMA ,  Patent SVG to diagonal occluded SVG to  RCA, report 5 x 18 BMS stent to native RCA  . Ischemic colitis ([place]) 2010  . Obstructive sleep apnea 2010  . CHF (congestive heart failure) [facility])     Past Surgical History  Procedure Laterality Date  . Coronary angioplasty  10/2010    WITH STENT PLACEMENT  . Coronary artery bypass graft  2009  . Joint replacement  2007    HIP REPLACEMENT -LEFT  . Bladder tack      1969 AND 1990  . Cystoscopy with injection  09/21/2011    Procedure: CYSTOSCOPY WITH INJECTION;  Surgeon: Ailene Rud, MD;  Location: [facility];  Service: Urology;  Laterality: N/A;  Peri Urethral Macroplastique Injection and Estring Placement     Allergies  Allergies  Allergen Reactions  . Sulfonamide Derivatives Hives    History of Present Illness    Gina Bowers is an 82 yo female patient of Dr. Sallyanne Kuster with PMH of CAD s/p 4v CABG in 2009, (PCI with stent to native RCA following occlusion of the SVG to the RCA 2012), HLD/hypothyroidism/CVA/CHF/PVD. Her last visit to the office was in 07/24/2015 where she was noted to have a new onset of HTN and started on low dose lisinopril. At that time, a renal artery duplex  US was scheduled given the late onset age of her HTN. There was no evidence of renal artery stenosis noted. At this office visit she was noted to be on ASA, plavix, statin, along with lisinopril which was added.   She currently lives at a retirement facility. States that she was in her usual state of health, which includes being very active around the facility involved in many activities, including bike riding, bingo and other social events. Wednesday she reports developing discomfort between her shoulder blades, like a musculoskeletal pain, while sitting in the beauty chair at the [place]. States his pain stayed consistent throughout the day, with some alleviation later that afternoon. When she arrived at home she felt fatigued enough to lay down and take a nap. Her daughter reports this is very unusual for her,  reporting that she normally stays up until around 1 AM on her ipad. Upon awaking she  still noted this pain between her shoulder blades which her daughter attempted to ease with essential oils. Reports some mild relief but went to bed early that night. On Thursday morning she continued to have this pain between her shoulder blades with some recurrence of chest pain into the left side of her chest. She denies any radiation into her bilateral jaws or arms. Continued to be fatigued, so her daughter brought her him to spend the night with her. Thursday night she reports the pain between her shoulder blades became worse with associated symptoms of nausea dyspnea, and left-sided chest pain. She was unable to sleep, and ultimately came to the ED for further evaluation. Reports being compliant with her medications at home. These symptoms she reports are very similar to previous cardiac events in 2012 when she was in [place] visiting family, and received a stent to the native RCA. Of note she reports being hypertensive since Monday, was recently started on lisinopril but this was DC'd in relation to dry hacking cough. The finding of hypertension is relatively new to her, and senseless and a probe was DC'd no other BP medications on board.  In the ED, she was given 324 aspirin, and started on IV heparin given concerns for CAD. Labs show negative cardiac troponins 2, creatinine of 0.61, hemoglobin of 12.4, and white blood cell count of 9.8. Chest x-ray was negative. EKG showed sinus rhythm with rate in the 60s, and no ST/T-wave abnormalities. She was admitted to internal medicine service. She received a CT angiogram of chest, with no acute findings.  Cardiology has been consult and in relationship patient's complaint of chest pain.  Inpatient Medications    . clopidogrel  75 mg Oral Daily  . levothyroxine  75 mcg Oral QAC breakfast  . losartan  25 mg Oral Daily  . pantoprazole  40 mg Oral Daily  . rosuvastatin   20 mg Oral Daily  . sertraline  50 mg Oral Daily    Family History    Family History  Problem Relation Age of Onset  . Heart disease Mother     Social History    Social History   Social History  . Marital Status: Widowed    Spouse Name: N/A  . Number of Children: N/A  . Years of Education: N/A   Occupational History  . Not on file.   Social History Main Topics  . Smoking status: Never Smoker   . Smokeless tobacco: Never Used  . Alcohol Use: No  . Drug Use: No  . Sexual Activity: Not on file  Other Topics Concern  . Not on file   Social History Narrative     Review of Systems    General:  No chills, fever, night sweats or weight changes.  Cardiovascular:  + chest pain, + dyspnea on exertion, edema, orthopnea, palpitations, paroxysmal nocturnal dyspnea. Dermatological: No rash, lesions/masses Respiratory: No cough,+ dyspnea Urologic: No hematuria, dysuria Abdominal:   No nausea, vomiting, diarrhea, bright red blood per rectum, melena, or hematemesis Neurologic:  No visual changes, + wkns, changes in mental status. All other systems reviewed and are otherwise negative except as noted above.  Physical Exam    Blood pressure 165/86, pulse 68, temperature 97.9 F (36.6 C), temperature source Oral, resp. rate 12, height 5\' 1"  (1.549 m), weight 170 lb (77.111 kg), SpO2 96 %.  General: Pleasant older female, NAD Psych: Normal affect. Neuro: Alert and oriented X 3. Moves all extremities spontaneously. HEENT: Normal  Neck: Supple without bruits or JVD. Lungs:  Resp regular and unlabored, CTA. Heart: RRR no s3, s4, or murmurs. Abdomen: Soft, non-tender, non-distended, BS + x 4.  Extremities: No clubbing, cyanosis or edema. DP/PT/Radials 2+ and equal bilaterally.  Labs    Troponin [facility] Test)  Recent Labs  10/04/15 0235  TROPIPOC 0.00    Recent Labs  10/04/15 0816  TROPONINI <0.03   Lab Results  Component Value Date   WBC 9.8 10/04/2015    HGB 12.4 10/04/2015   HCT 38.7 10/04/2015   MCV 92.8 10/04/2015   PLT 260 10/04/2015    Recent Labs Lab 10/04/15 0027  NA 137  K 4.2  CL 101  CO2 25  BUN 22*  CREATININE 0.61  CALCIUM 9.3  PROT 6.4*  BILITOT 0.3  ALKPHOS 52  ALT 22  AST 21  GLUCOSE 108*   Lab Results  Component Value Date   CHOL 147 08/17/2015   HDL 58 08/17/2015   LDLCALC 75 08/17/2015   TRIG 70 08/17/2015   No results found for: [facility]   Radiology Studies    Dg Chest 2 View  10/04/2015  CLINICAL DATA:  82 year old female with chest pain and shortness of breath EXAM: CHEST  2 VIEW COMPARISON:  Chest radiograph dated 09/17/2015 FINDINGS: Two views of the chest do not demonstrate a focal consolidation. There is no pleural effusion or pneumothorax. Stable vertebroplasty changes of the lower thoracic spine. No acute fracture. IMPRESSION: No active cardiopulmonary disease. Electronically Signed   By: Anner Crete M.D.   On: 10/04/2015 03:05    Ct Angio Chest Aorta W/cm &/or Wo/cm  10/04/2015  CLINICAL DATA:  Intermittent left shoulder and upper back pain since yesterday. Generalized weakness and fatigue. EXAM: CT ANGIOGRAPHY CHEST, ABDOMEN AND PELVIS TECHNIQUE: Multidetector CT imaging through the chest, abdomen and pelvis was performed using the standard protocol during bolus administration of intravenous contrast. Multiplanar reconstructed images and MIPs were obtained and reviewed to evaluate the vascular anatomy. CONTRAST:  100 mL Isovue 370 intravenous COMPARISON:  None. FINDINGS: CTA CHEST FINDINGS The thoracic aorta is normal in caliber. There is no dissection or aneurysm. Moderate non stenotic plaque is present at the origin of the left subclavian artery. The other great vessel origins are unremarkable. Proximal great vessels are patent. The pulmonary vasculature is also well opacified and is normal. Review of the MIP images confirms the above findings. Nonvascular findings in the chest: The lungs are  clear. Airways are patent and normal in caliber. There is no hilar or mediastinal adenopathy. There are no effusions.  There is a large hiatal hernia. CTA ABDOMEN AND PELVIS FINDINGS The abdominal aorta is normal in caliber with moderate atherosclerotic calcification. There is no dissection. The origins of the celiac and superior mesenteric artery are heavily calcified but only mildly stenotic with less than 50% diameter reduction. There are 2 right renal arteries, both patent although the main right renal artery does have bulky plaque at its origin. There is a single left renal artery, calcified at its origin but patent. Both common iliac arteries are widely patent and normal in caliber. Iliac bifurcations are patent bilaterally. External iliac arteries are patent bilaterally. Review of the MIP images confirms the above findings. Nonvascular findings in the abdomen and pelvis: There are normal appearances of the liver, gallbladder, bile ducts, pancreas, spleen and adrenals. There is a 4.2 cm benign cyst at the upper pole of the left kidney. There is a cortical scar at the upper pole of the right kidney. No suspicious parenchymal renal lesions are evident. The collecting systems and ureters are unremarkable bilaterally. Urinary bladder is unremarkable bilaterally. The stomach is remarkable only for the large hiatal hernia. Small bowel and colon are unremarkable. There is mild metal artifact from left hip prosthesis. No significant skeletal lesions are evident. Prior vertebral augmentation at T10. IMPRESSION: 1. Normal caliber thoracic and abdominal aorta. No dissection or aneurysm. Moderate plaque at the origin of many of the major branch vessels, but no critical stenoses. 2. No acute findings are evident in the chest, abdomen or pelvis. 3. Large hiatal hernia. Electronically Signed   By: Andreas Newport M.D.   On: 10/04/2015 05:33   Ct Angio Chest/abd/pel For Dissection W And/or W/wo  10/04/2015  CLINICAL DATA:   Intermittent left shoulder and upper back pain since yesterday. Generalized weakness and fatigue. EXAM: CT ANGIOGRAPHY CHEST, ABDOMEN AND PELVIS TECHNIQUE: Multidetector CT imaging through the chest, abdomen and pelvis was performed using the standard protocol during bolus administration of intravenous contrast. Multiplanar reconstructed images and MIPs were obtained and reviewed to evaluate the vascular anatomy. CONTRAST:  100 mL Isovue 370 intravenous COMPARISON:  None. FINDINGS: CTA CHEST FINDINGS The thoracic aorta is normal in caliber. There is no dissection or aneurysm. Moderate non stenotic plaque is present at the origin of the left subclavian artery. The other great vessel origins are unremarkable. Proximal great vessels are patent. The pulmonary vasculature is also well opacified and is normal. Review of the MIP images confirms the above findings. Nonvascular findings in the chest: The lungs are clear. Airways are patent and normal in caliber. There is no hilar or mediastinal adenopathy. There are no effusions. There is a large hiatal hernia. CTA ABDOMEN AND PELVIS FINDINGS The abdominal aorta is normal in caliber with moderate atherosclerotic calcification. There is no dissection. The origins of the celiac and superior mesenteric artery are heavily calcified but only mildly stenotic with less than 50% diameter reduction. There are 2 right renal arteries, both patent although the main right renal artery does have bulky plaque at its origin. There is a single left renal artery, calcified at its origin but patent. Both common iliac arteries are widely patent and normal in caliber. Iliac bifurcations are patent bilaterally. External iliac arteries are patent bilaterally. Review of the MIP images confirms the above findings. Nonvascular findings in the abdomen and pelvis: There are normal appearances of the liver, gallbladder, bile ducts, pancreas, spleen and adrenals. There is a 4.2 cm benign cyst at the  upper pole of the left kidney. There is  a cortical scar at the upper pole of the right kidney. No suspicious parenchymal renal lesions are evident. The collecting systems and ureters are unremarkable bilaterally. Urinary bladder is unremarkable bilaterally. The stomach is remarkable only for the large hiatal hernia. Small bowel and colon are unremarkable. There is mild metal artifact from left hip prosthesis. No significant skeletal lesions are evident. Prior vertebral augmentation at T10. IMPRESSION: 1. Normal caliber thoracic and abdominal aorta. No dissection or aneurysm. Moderate plaque at the origin of many of the major branch vessels, but no critical stenoses. 2. No acute findings are evident in the chest, abdomen or pelvis. 3. Large hiatal hernia. Electronically Signed   By: Andreas Newport M.D.   On: 10/04/2015 05:33    ECG & Cardiac Imaging    EKG: NSR Rate -60s. No significant ST/T-wave abnormalities.  ECHO: 08/18/2015:  Left ventricle: The cavity size was normal. Systolic function was  normal. The estimated ejection fraction was in the range of 60%  to 65%. Wall motion was normal; there were no regional wall  motion abnormalities. There was an increased relative  contribution of atrial contraction to ventricular filling.  Doppler parameters are consistent with abnormal left ventricular  relaxation (grade 1 diastolic dysfunction).  Assessment & Plan    1. Unstable Angina: She reports a three-day history of pain between the shoulder blades, with associated symptoms of chest pain, dyspnea, nausea and reports the symptoms are very similar to previous cardiac episode in 2012 when that was placed to RCA. She reports worsening fatigue over the past 3 days, interfering with her activities of daily living at a retirement center. She was seen in the ED and given 324 of aspirin along with IV heparin drip. Cardiac enzymes are negative 2. EKG showed NSR Rate-60 with no significant ST/T-wave  abnormalities. Also received is CTA with no acute findings. --Given her reports of symptoms and history of CABG, with SVG occlusion to the RCA requiring stent placement to the native RCA in 2012, there is concern for possible reocclusion to one of these areas. 2012 the OM was described as moderate diseased vessel with 75% proximal stenosis. --Of note she was noted to be hypertensive in the 190s per her report on Monday, which is probably continued since.  --Continue to cycle cardiac enzymes 3. --Remain on IV heparin. --Continue with Plavix, aspirin and statin.  --would make patient NPO and strongly consider patient for left heart cath if possible today.   2. HTN: This is a relatively new finding according to previous charts. Was started on lisinopril back on 07/24/2015, but developed a dry cough and this was DC'd. In the ED she was noted to be hypertensive, 1 inch of Nitropaste was placed to the patient's chest with relief of pain. Losartan was added via primary admitting team. Blood pressure is currently stable. --Continue to monitor.   3. Hypothyroidism  4. Hx of CVA  -Dr. Oval Linsey to see patient.   Barnet Pall, NP-C Pager [phone] 10/04/2015, 9:55 AM

## 2015-10-04 NOTE — H&P (Signed)
History and Physical  Patient Name: Gina Bowers     [id number]    DOB: 1926-02-16    DOA: 10/04/2015 PCP: Irven Shelling, MD   Patient coming from: [facility] living facility --> daughter's house --> ER     Chief Complaint: Chest pain  HPI: Gina Bowers is a 82 y.o. female with a past medical history significant for CAD s/p CABG in 2009, PCI to graft in 2012, hx of CVA and PVD, and hypothyroidism who presents with chest pain.  The pain started Wednesday with discomfort between her shoulders like a muscle after being out with daughter shopping.  Wednesday night, the pain was severe, associated with fatigue and sweats, jaw pain (she had to take her dentures out it felt so bad), and she just slept all night.  Thursday, the shoulder/back discomfort was somewhat less but still present, constant, and radiating at times to the neck.  She had fatigue, shortness of breath, whole body weakness, and nausea most of the day.  At one point, she told her daughter Thursday, "This feels like when I had my bypass surgery".  Finally, daughter felt she needed to be seen in the ER.  At no point has she had fever, cough, purulent sputum, dysuria, urinary symptoms.   In the ED, she was hypertensive to 190/85.  The patient's initial ECG showed normal sinus rhythm and troponin was negative.  CTA was negative for PE, dissection or renal artery stenosis.  Nitro paste was administered and her pain improved and BP improved.  Heparin was started given concern for ACS and [facility] was asked to admit.  She has never had high blood pressure goal less than to last October. When she saw her cardiologist, Dr. Abelina Bachelor, in March, he started lisinopril and ordered a renal artery early Doppler which was normal. She developed a hacking cough and so lisinopril was stopped by PCP, and she has been following her blood pressure at home which was normal until presentation when it was 190/85.      Review of Systems:  All  other systems negative except as just noted or noted in the history of present illness.  Past Medical History  Diagnosis Date  . Coronary artery disease     DR. Rollene Fare IS PT'S CARDIOLOGIST - HX CABG AND HAS STENT AND TOLD SHE HAS ANOTHER BLOCKAGE-BEING TREATED MEDICALLY  . High cholesterol   . Lung nodules     TOLD SHE HAS INTERSTITUAL LUNG DISEASE  . Shortness of breath     AND WHEEZING AT TIMES--NO INHALERS  . Vertigo   . Arthritis   . GERD (gastroesophageal reflux disease)   . Urinary incontinence   . UTI (lower urinary tract infection)     HX OF UTI'S-- LAST TIME COUPLE OF MONTHS AGO  . Hypothyroidism   . Blood transfusion 2007    AFTER HIP REPLACEMENT  . Fracture FEB 2013    FRACTURED LEFT ANKLE--NO SURGERY--WAS IN BOOT UNTIL COUPLE WEEKS AGO  . Depression   . Carotid bruit     PT'S DAUGHTER STATES PT HAD RECENT CAROTID STUDY AND WAS TOLD NO SIGNIFICANT BLOCKAGES  . Norwalk virus     COUPLE OF WEEKS AGO--ALL SYMTOMS RESOLVED PER PT  . Kidney stones     IN THE PAST  . Stroke ([facility])     SEVERAL STROKES -TOLD SHE HAS CEREBELLUM BLOCKAGE AS RESULT OF STROKE--BUT NEUROLOGIST SAID HE DID NOT WANT TO DO ANY PROCEDURE BECAUSE OF HER AGE .  PT  HAS SLIGHT LEFT FOOT DROP-DRAGS FOOT WHEN WALKING - USES WALKER  . CAD (coronary artery disease) of artery bypass graft 06/06/2014     2009 CABG 4, Dr. Servando Snare (LIMA to LAD , SVG to diagonal, SVG to circumflex, SVG to PDA  Cath 2012 in  Recent ill,[place] patent LIMA ,  Patent SVG to diagonal occluded SVG to RCA, report 5 x 18 BMS stent to native RCA  . Ischemic colitis ([place]) 2010  . Obstructive sleep apnea 2010  . CHF (congestive heart failure) [facility])     Past Surgical History  Procedure Laterality Date  . Coronary angioplasty  10/2010    WITH STENT PLACEMENT  . Coronary artery bypass graft  2009  . Joint replacement  2007    HIP REPLACEMENT -LEFT  . Bladder tack      1969 AND 1990  . Cystoscopy with injection  09/21/2011     Procedure: CYSTOSCOPY WITH INJECTION;  Surgeon: Ailene Rud, MD;  Location: [facility]S;  Service: Urology;  Laterality: N/A;  Peri Urethral Macroplastique Injection and Estring Placement    Social History: Patient lives in an independent living facility.  Patient walks with a walker.  She has an aide who assists her with dressing and showering. He has no dementia. She has never smoked.    Allergies  Allergen Reactions  . Sulfonamide Derivatives Hives    Family history: family history includes Heart disease in her mother.  Prior to Admission medications   Medication Sig Start Date End Date Taking? Authorizing Provider  acetaminophen (TYLENOL) 500 MG tablet Take 1,000 mg by mouth every 6 (six) hours as needed for mild pain.   Yes Historical Provider, MD  Albuterol Sulfate (PROAIR RESPICLICK) 123XX123 (90 Base) MCG/ACT AEPB Inhale 2 puffs into the lungs daily as needed (shortness of breath).   Yes Historical Provider, MD  alendronate (FOSAMAX) 70 MG tablet Take 1 tablet (70 mg total) by mouth once a week. Take with a full glass of water on an empty stomach. Patient taking differently: Take 70 mg by mouth once a week. Take with a full glass of water on an empty stomach.mon 06/06/14  Yes Mihai Croitoru, MD  aspirin EC 81 MG tablet Take 81 mg by mouth every evening.    Yes Historical Provider, MD  Calcium Carb-Cholecalciferol (CALCIUM 1000 + D PO) Take 1 tablet by mouth daily.    Yes Historical Provider, MD  clopidogrel (PLAVIX) 75 MG tablet Take 1 tablet (75 mg total) by mouth daily. 06/06/14  Yes Mihai Croitoru, MD  CRANBERRY EXTRACT PO Take 500 mg by mouth daily.   Yes Historical Provider, MD  fish oil-omega-3 fatty acids 1000 MG capsule Take 1 g by mouth daily with breakfast.   Yes Historical Provider, MD  levothyroxine (SYNTHROID, LEVOTHROID) 75 MCG tablet Take 1 tablet (75 mcg total) by mouth daily before breakfast. 06/06/14  Yes Mihai Croitoru, MD  meclizine (ANTIVERT) 25 MG tablet Take 25 mg  by mouth 3 (three) times daily as needed for dizziness.   Yes Historical Provider, MD  mirabegron ER (MYRBETRIQ) 50 MG TB24 tablet Take 1 tablet (50 mg total) by mouth 2 (two) times daily. 06/06/14  Yes Mihai Croitoru, MD  Multiple Vitamin (MULITIVITAMIN WITH MINERALS) TABS Take 1 tablet by mouth daily with breakfast.   Yes Historical Provider, MD  ondansetron (ZOFRAN ODT) 4 MG disintegrating tablet Take one every 6 hours for nausea 06/06/14  Yes Sanda Klein, MD  OVER THE COUNTER MEDICATION Take 2 capsules  by mouth daily. Immuplex Immune Booster. Takes during the winter months   Yes Historical Provider, MD  pantoprazole (PROTONIX) 40 MG tablet Take 1 tablet (40 mg total) by mouth daily. 06/04/15  Yes Mihai Croitoru, MD  Polyethyl Glycol-Propyl Glycol (SYSTANE OP) Place 1 drop into both eyes 3 (three) times daily. Patient states its the Gel form. Not sure what percentage. Take every day per patient   Yes Historical Provider, MD  Probiotic Product (PROBIOTIC PO) Take 1 tablet by mouth daily.   Yes Historical Provider, MD  rosuvastatin (CRESTOR) 20 MG tablet Take 1 tablet (20 mg total) by mouth daily. 09/30/15  Yes Mihai Croitoru, MD  sertraline (ZOLOFT) 50 MG tablet 1.5 tablets by mouth every day Patient taking differently: Take 50 mg by mouth daily.  06/06/14  Yes Mihai Croitoru, MD       Physical Exam: BP 172/82 mmHg  Pulse 74  Temp(Src) 97.5 F (36.4 C) (Oral)  Resp 14  Ht 5\' 1"  (1.549 m)  Wt 77.111 kg (170 lb)  BMI 32.14 kg/m2  SpO2 97% General appearance: Well-developed, adult female, alert and in no acute distress.   Eyes: Anicteric, conjunctiva pink, lids and lashes normal.     ENT: No nasal deformity, discharge, or epistaxis.  OP moist without lesions.  Edentulous. Skin: Warm and dry.   Cardiac: RRR, nl S1-S2, no murmurs appreciated.  Capillary refill is brisk.  JVP normal.  No LE edema.  Radial and DP pulses 2+ and symmetric.  No carotid bruits. Respiratory: Normal respiratory rate  and rhythm.  CTAB without rales or wheezes. Abdomen: Abdomen soft without rigidity.  No TTP. No ascites, distension.   MSK: No deformities or effusions.   Pain not reproduced with palpation of precordium.  No pain with arm movement. Neuro: Sensorium intact and responding to questions, attention normal.  Speech is fluent.  Moves all extremities equally and with normal coordination.    Psych: Behavior appropriate.  Affect normal.  No evidence of aural or visual hallucinations or delusions.       Labs on Admission:  The metabolic panel shows eelevated BUN to creatinine ratio. The complete blood count shows no anemia, thrombocytopenia, or leukocytosis. The initial troponin is negative.  Radiological Exams on Admission: Personally reviewed: Dg Chest 2 View  10/04/2015  CLINICAL DATA:  82 year old female with chest pain and shortness of breath EXAM: CHEST  2 VIEW COMPARISON:  Chest radiograph dated 09/17/2015 FINDINGS: Two views of the chest do not demonstrate a focal consolidation. There is no pleural effusion or pneumothorax. Stable vertebroplasty changes of the lower thoracic spine. No acute fracture. IMPRESSION: No active cardiopulmonary disease. Electronically Signed   By: Anner Crete M.D.   On: 10/04/2015 03:05   Ct Angio Chest Aorta W/cm &/or Wo/cm  10/04/2015  CLINICAL DATA:  Intermittent left shoulder and upper back pain since yesterday. Generalized weakness and fatigue. EXAM: CT ANGIOGRAPHY CHEST, ABDOMEN AND PELVIS TECHNIQUE: Multidetector CT imaging through the chest, abdomen and pelvis was performed using the standard protocol during bolus administration of intravenous contrast. Multiplanar reconstructed images and MIPs were obtained and reviewed to evaluate the vascular anatomy. CONTRAST:  100 mL Isovue 370 intravenous COMPARISON:  None. FINDINGS: CTA CHEST FINDINGS The thoracic aorta is normal in caliber. There is no dissection or aneurysm. Moderate non stenotic plaque is present at  the origin of the left subclavian artery. The other great vessel origins are unremarkable. Proximal great vessels are patent. The pulmonary vasculature is also well opacified and  is normal. Review of the MIP images confirms the above findings. Nonvascular findings in the chest: The lungs are clear. Airways are patent and normal in caliber. There is no hilar or mediastinal adenopathy. There are no effusions. There is a large hiatal hernia. CTA ABDOMEN AND PELVIS FINDINGS The abdominal aorta is normal in caliber with moderate atherosclerotic calcification. There is no dissection. The origins of the celiac and superior mesenteric artery are heavily calcified but only mildly stenotic with less than 50% diameter reduction. There are 2 right renal arteries, both patent although the main right renal artery does have bulky plaque at its origin. There is a single left renal artery, calcified at its origin but patent. Both common iliac arteries are widely patent and normal in caliber. Iliac bifurcations are patent bilaterally. External iliac arteries are patent bilaterally. Review of the MIP images confirms the above findings. Nonvascular findings in the abdomen and pelvis: There are normal appearances of the liver, gallbladder, bile ducts, pancreas, spleen and adrenals. There is a 4.2 cm benign cyst at the upper pole of the left kidney. There is a cortical scar at the upper pole of the right kidney. No suspicious parenchymal renal lesions are evident. The collecting systems and ureters are unremarkable bilaterally. Urinary bladder is unremarkable bilaterally. The stomach is remarkable only for the large hiatal hernia. Small bowel and colon are unremarkable. There is mild metal artifact from left hip prosthesis. No significant skeletal lesions are evident. Prior vertebral augmentation at T10. IMPRESSION: 1. Normal caliber thoracic and abdominal aorta. No dissection or aneurysm. Moderate plaque at the origin of many of the  major branch vessels, but no critical stenoses. 2. No acute findings are evident in the chest, abdomen or pelvis. 3. Large hiatal hernia. Electronically Signed   By: Andreas Newport M.D.   On: 10/04/2015 05:33   Ct Angio Chest/abd/pel For Dissection W And/or W/wo  10/04/2015  CLINICAL DATA:  Intermittent left shoulder and upper back pain since yesterday. Generalized weakness and fatigue. EXAM: CT ANGIOGRAPHY CHEST, ABDOMEN AND PELVIS TECHNIQUE: Multidetector CT imaging through the chest, abdomen and pelvis was performed using the standard protocol during bolus administration of intravenous contrast. Multiplanar reconstructed images and MIPs were obtained and reviewed to evaluate the vascular anatomy. CONTRAST:  100 mL Isovue 370 intravenous COMPARISON:  None. FINDINGS: CTA CHEST FINDINGS The thoracic aorta is normal in caliber. There is no dissection or aneurysm. Moderate non stenotic plaque is present at the origin of the left subclavian artery. The other great vessel origins are unremarkable. Proximal great vessels are patent. The pulmonary vasculature is also well opacified and is normal. Review of the MIP images confirms the above findings. Nonvascular findings in the chest: The lungs are clear. Airways are patent and normal in caliber. There is no hilar or mediastinal adenopathy. There are no effusions. There is a large hiatal hernia. CTA ABDOMEN AND PELVIS FINDINGS The abdominal aorta is normal in caliber with moderate atherosclerotic calcification. There is no dissection. The origins of the celiac and superior mesenteric artery are heavily calcified but only mildly stenotic with less than 50% diameter reduction. There are 2 right renal arteries, both patent although the main right renal artery does have bulky plaque at its origin. There is a single left renal artery, calcified at its origin but patent. Both common iliac arteries are widely patent and normal in caliber. Iliac bifurcations are patent  bilaterally. External iliac arteries are patent bilaterally. Review of the MIP images confirms the above findings.  Nonvascular findings in the abdomen and pelvis: There are normal appearances of the liver, gallbladder, bile ducts, pancreas, spleen and adrenals. There is a 4.2 cm benign cyst at the upper pole of the left kidney. There is a cortical scar at the upper pole of the right kidney. No suspicious parenchymal renal lesions are evident. The collecting systems and ureters are unremarkable bilaterally. Urinary bladder is unremarkable bilaterally. The stomach is remarkable only for the large hiatal hernia. Small bowel and colon are unremarkable. There is mild metal artifact from left hip prosthesis. No significant skeletal lesions are evident. Prior vertebral augmentation at T10. IMPRESSION: 1. Normal caliber thoracic and abdominal aorta. No dissection or aneurysm. Moderate plaque at the origin of many of the major branch vessels, but no critical stenoses. 2. No acute findings are evident in the chest, abdomen or pelvis. 3. Large hiatal hernia. Electronically Signed   By: Andreas Newport M.D.   On: 10/04/2015 05:33    EKG: Independently reviewed. Rate 66, QTc 448, no ischemic changes.    Assessment/Plan 1. Chest pain: This is new.  The patient has HEART score of 6. Angina is suspected, and heparin will be continued.  Other potential causes of chest pain (PE, dissection, pancreatitis, pneumonia/effusion, pericarditis) are doubted.  We have been asked to admit the patient for observation and etiology consultation with Cardiology. Aspirin was administered. -Serial troponins are ordered -Telemetry -Consult to cardiology, appreciate recommendations -Continue heparin gtt   2. Hypertension:  This is new.  Cough with lisinopril. -Losartan 25 mg ordered  3. Hypothyroidism:  -Continue home levothyroxine  4. Coronary disease:  -Continue Plavix, statin  5. Other chronic conditions:  -Hold  nonessential medicines, PPI, mirabegron and sertlraline while observation status    DVT prophylaxis: Heparin Diet: NPO Code Status: DO NOT RESUSCITATE Family Communication: Daughter at the bedside  Disposition Plan: Anticipate risk stratification by Cardiology.  Further disposition pending Cardiology. Consults called: InBasket electronic message sent to Cardiology pool Admission status: Telemetry   Medical decision making: Patient seen at 6:52 AM on 10/04/2015.  The patient was discussed with Dr. Dina Rich. What exists of the patient's chart was reviewed in depth.  Clinical condition: stable.      Edwin Dada [facility] Pager [phone]

## 2015-10-04 NOTE — Progress Notes (Signed)
    Pt placed on the Cath board for Monday. Will order a heart healthy diet.    Reino Bellis NP-C  Pager [phone]

## 2015-10-04 NOTE — Progress Notes (Signed)
ANTICOAGULATION CONSULT NOTE - Initial Consult  Pharmacy Consult for Heparin  Indication: chest pain/ACS  Allergies  Allergen Reactions  . Sulfonamide Derivatives Hives    Patient Measurements: Height: 5\' 1"  (154.9 cm) Weight: 170 lb (77.111 kg) IBW/kg (Calculated) : 47.8  Vital Signs: Temp: 97.5 F (36.4 C) (05/12 0010) Temp Source: Oral (05/12 0010) BP: 138/83 mmHg (05/12 0600) Pulse Rate: 74 (05/12 0600)  Labs:  Recent Labs  10/04/15 0027  HGB 12.4  HCT 38.7  PLT 260  CREATININE 0.61    Estimated Creatinine Clearance: 45.7 mL/min (by C-G formula based on Cr of 0.61).   Medical History: Past Medical History  Diagnosis Date  . Coronary artery disease     DR. Rollene Fare IS PT'S CARDIOLOGIST - HX CABG AND HAS STENT AND TOLD SHE HAS ANOTHER BLOCKAGE-BEING TREATED MEDICALLY  . High cholesterol   . Lung nodules     TOLD SHE HAS INTERSTITUAL LUNG DISEASE  . Shortness of breath     AND WHEEZING AT TIMES--NO INHALERS  . Vertigo   . Arthritis   . GERD (gastroesophageal reflux disease)   . Urinary incontinence   . UTI (lower urinary tract infection)     HX OF UTI'S-- LAST TIME COUPLE OF MONTHS AGO  . Hypothyroidism   . Blood transfusion 2007    AFTER HIP REPLACEMENT  . Fracture FEB 2013    FRACTURED LEFT ANKLE--NO SURGERY--WAS IN BOOT UNTIL COUPLE WEEKS AGO  . Depression   . Carotid bruit     PT'S DAUGHTER STATES PT HAD RECENT CAROTID STUDY AND WAS TOLD NO SIGNIFICANT BLOCKAGES  . [place] virus     COUPLE OF WEEKS AGO--ALL SYMTOMS RESOLVED PER PT  . Kidney stones     IN THE PAST  . Stroke ([place])     SEVERAL STROKES -TOLD SHE HAS CEREBELLUM BLOCKAGE AS RESULT OF STROKE--BUT NEUROLOGIST SAID HE DID NOT WANT TO DO ANY PROCEDURE BECAUSE OF HER AGE .  PT HAS SLIGHT LEFT FOOT DROP-DRAGS FOOT WHEN WALKING - USES WALKER  . CAD (coronary artery disease) of artery bypass graft 06/06/2014     2009 CABG 4, Dr. Servando Snare (LIMA to LAD , SVG to diagonal, SVG to circumflex, SVG  to PDA  Cath 2012 in  Recent ill,[place] patent LIMA ,  Patent SVG to diagonal occluded SVG to RCA, report 5 x 18 BMS stent to native RCA  . Ischemic colitis ([place]) 2010  . Obstructive sleep apnea 2010  . CHF (congestive heart failure) ([facility])    Assessment: 82 y/o F with significant cardiac history, here with fatigue/chest pain/shoulder pain, POC troponin is negative, CT Angio with no acute findings in chest/abdomen/pelvis, CBC good, renal function good.   Goal of Therapy:  Heparin level 0.3-0.7 units/ml Monitor platelets by anticoagulation protocol: Yes   Plan:  -Heparin 3000 units BOLUS -Start heparin at 700 units/hr -1430 HL -F/U cardiology plans  Narda Bonds 10/04/2015,6:16 AM

## 2015-10-04 NOTE — ED Notes (Signed)
Dr. Danford at bedside  

## 2015-10-04 NOTE — ED Notes (Signed)
Heart Heathy Tray ordered @ [id number].

## 2015-10-04 NOTE — ED Notes (Signed)
Pt. reports intermittent left shoulder / left upper back pain onset yesterday with generalized weakness/fatigue onset yesterday , denies injury or fall .

## 2015-10-04 NOTE — ED Provider Notes (Signed)
CSN: [id number]     Arrival date & time 10/03/15  2357 History  By signing my name below, I, Gina Bowers, attest that this documentation has been prepared under the direction and in the presence of Merryl Hacker, MD. Electronically Signed: Irene Bowers, ED Scribe. 10/04/2015. 5:40 AM.   Chief Complaint  Patient presents with  . Shoulder Pain  . Back Pain  . Weakness   The history is provided by the patient. No language interpreter was used.  HPI Comments: Gina Bowers is a 82 y.o. female with a hx of bypass graft surgery, stent placement, coronary artery disease and CHF who presents to the Emergency Department complaining of generalized fatigue onset one day ago. Pt reports associated nausea, SOB, mild central chest pain that she rates 3/10, lightheadedness, intermittent left shoulder and left upper back pain. Pt states that she is typically very active, but has not been able to do much recently. Daughter states that the pt told her that her weakness and fatigue feels like when she had to have her bypass surgery. Pt has taken 1 aspirin today to no relief. Pt is on Plavix. She denies worsening or alleviating factors. Pt denies injury or fall, leg swelling, vomiting, diarrhea, or rash.   Cardiologist: Sanda Klein, MD  Past Medical History  Diagnosis Date  . Coronary artery disease     DR. Rollene Fare IS PT'S CARDIOLOGIST - HX CABG AND HAS STENT AND TOLD SHE HAS ANOTHER BLOCKAGE-BEING TREATED MEDICALLY  . High cholesterol   . Lung nodules     TOLD SHE HAS INTERSTITUAL LUNG DISEASE  . Shortness of breath     AND WHEEZING AT TIMES--NO INHALERS  . Vertigo   . Arthritis   . GERD (gastroesophageal reflux disease)   . Urinary incontinence   . UTI (lower urinary tract infection)     HX OF UTI'S-- LAST TIME COUPLE OF MONTHS AGO  . Hypothyroidism   . Blood transfusion 2007    AFTER HIP REPLACEMENT  . Fracture FEB 2013    FRACTURED LEFT ANKLE--NO SURGERY--WAS IN BOOT UNTIL COUPLE  WEEKS AGO  . Depression   . Carotid bruit     PT'S DAUGHTER STATES PT HAD RECENT CAROTID STUDY AND WAS TOLD NO SIGNIFICANT BLOCKAGES  . [place] virus     COUPLE OF WEEKS AGO--ALL SYMTOMS RESOLVED PER PT  . Kidney stones     IN THE PAST  . Stroke ([place])     SEVERAL STROKES -TOLD SHE HAS CEREBELLUM BLOCKAGE AS RESULT OF STROKE--BUT NEUROLOGIST SAID HE DID NOT WANT TO DO ANY PROCEDURE BECAUSE OF HER AGE .  PT HAS SLIGHT LEFT FOOT DROP-DRAGS FOOT WHEN WALKING - USES WALKER  . CAD (coronary artery disease) of artery bypass graft 06/06/2014     2009 CABG 4, Dr. Servando Snare (LIMA to LAD , SVG to diagonal, SVG to circumflex, SVG to PDA  Cath 2012 in  Recent ill,[place] patent LIMA ,  Patent SVG to diagonal occluded SVG to RCA, report 5 x 18 BMS stent to native RCA  . Ischemic colitis ([place]) 2010  . Obstructive sleep apnea 2010  . CHF (congestive heart failure) [facility])    Past Surgical History  Procedure Laterality Date  . Coronary angioplasty  10/2010    WITH STENT PLACEMENT  . Coronary artery bypass graft  2009  . Joint replacement  2007    HIP REPLACEMENT -LEFT  . Bladder tack      1969 AND 1990  . Cystoscopy with  injection  09/21/2011    Procedure: CYSTOSCOPY WITH INJECTION;  Surgeon: Ailene Rud, MD;  Location: [facility];  Service: Urology;  Laterality: N/A;  Peri Urethral Macroplastique Injection and Estring Placement   Family History  Problem Relation Age of Onset  . Heart disease Mother    Social History  Substance Use Topics  . Smoking status: Never Smoker   . Smokeless tobacco: Never Used  . Alcohol Use: No   OB History    No data available     Review of Systems  Constitutional: Positive for activity change and fatigue.  Respiratory: Positive for shortness of breath.   Cardiovascular: Positive for chest pain. Negative for leg swelling.  Gastrointestinal: Positive for nausea. Negative for vomiting and diarrhea.  Musculoskeletal: Positive for back pain and arthralgias.   Skin: Negative for rash.  Neurological: Positive for light-headedness.  All other systems reviewed and are negative.  Allergies  Sulfonamide derivatives  Home Medications   Prior to Admission medications   Medication Sig Start Date End Date Taking? Authorizing Provider  acetaminophen (TYLENOL) 500 MG tablet Take 1,000 mg by mouth every 6 (six) hours as needed for mild pain.   Yes Historical Provider, MD  Albuterol Sulfate (PROAIR RESPICLICK) 123XX123 (90 Base) MCG/ACT AEPB Inhale 2 puffs into the lungs daily as needed (shortness of breath).   Yes Historical Provider, MD  alendronate (FOSAMAX) 70 MG tablet Take 1 tablet (70 mg total) by mouth once a week. Take with a full glass of water on an empty stomach. Patient taking differently: Take 70 mg by mouth once a week. Take with a full glass of water on an empty stomach.mon 06/06/14  Yes Mihai Croitoru, MD  aspirin EC 81 MG tablet Take 81 mg by mouth every evening.    Yes Historical Provider, MD  Calcium Carb-Cholecalciferol (CALCIUM 1000 + D PO) Take 1 tablet by mouth daily.    Yes Historical Provider, MD  clopidogrel (PLAVIX) 75 MG tablet Take 1 tablet (75 mg total) by mouth daily. 06/06/14  Yes Mihai Croitoru, MD  CRANBERRY EXTRACT PO Take 500 mg by mouth daily.   Yes Historical Provider, MD  fish oil-omega-3 fatty acids 1000 MG capsule Take 1 g by mouth daily with breakfast.   Yes Historical Provider, MD  levothyroxine (SYNTHROID, LEVOTHROID) 75 MCG tablet Take 1 tablet (75 mcg total) by mouth daily before breakfast. 06/06/14  Yes Mihai Croitoru, MD  meclizine (ANTIVERT) 25 MG tablet Take 25 mg by mouth 3 (three) times daily as needed for dizziness.   Yes Historical Provider, MD  mirabegron ER (MYRBETRIQ) 50 MG TB24 tablet Take 1 tablet (50 mg total) by mouth 2 (two) times daily. 06/06/14  Yes Mihai Croitoru, MD  Multiple Vitamin (MULITIVITAMIN WITH MINERALS) TABS Take 1 tablet by mouth daily with breakfast.   Yes Historical Provider, MD   ondansetron (ZOFRAN ODT) 4 MG disintegrating tablet Take one every 6 hours for nausea 06/06/14  Yes Mihai Croitoru, MD  OVER THE COUNTER MEDICATION Take 2 capsules by mouth daily. Immuplex Immune Booster. Takes during the winter months   Yes Historical Provider, MD  pantoprazole (PROTONIX) 40 MG tablet Take 1 tablet (40 mg total) by mouth daily. 06/04/15  Yes Mihai Croitoru, MD  Polyethyl Glycol-Propyl Glycol (SYSTANE OP) Place 1 drop into both eyes 3 (three) times daily. Patient states its the Gel form. Not sure what percentage. Take every day per patient   Yes Historical Provider, MD  Probiotic Product (PROBIOTIC PO) Take 1 tablet  by mouth daily.   Yes Historical Provider, MD  rosuvastatin (CRESTOR) 20 MG tablet Take 1 tablet (20 mg total) by mouth daily. 09/30/15  Yes Mihai Croitoru, MD  sertraline (ZOLOFT) 50 MG tablet 1.5 tablets by mouth every day Patient taking differently: Take 50 mg by mouth daily.  06/06/14  Yes Mihai Croitoru, MD   BP 156/82 mmHg  Pulse 65  Temp(Src) 97.5 F (36.4 C) (Oral)  Resp 18  Ht 5\' 1"  (1.549 m)  Wt 170 lb (77.111 kg)  BMI 32.14 kg/m2  SpO2 94% Physical Exam  Constitutional: She is oriented to person, place, and time. No distress.  Elderly, no acute distress  HENT:  Head: Normocephalic and atraumatic.  Eyes: Pupils are equal, round, and reactive to light.  Cardiovascular: Normal rate, regular rhythm and normal heart sounds.   Pulmonary/Chest: Effort normal and breath sounds normal. No respiratory distress. She has no wheezes.  Midline sternotomy scar  Abdominal: Soft. Bowel sounds are normal. There is no tenderness. There is no rebound.  Musculoskeletal: She exhibits edema.  Trace bilateral lower extremity edema  Neurological: She is alert and oriented to person, place, and time.  Skin: Skin is warm and dry.  Psychiatric: She has a normal mood and affect.  Nursing note and vitals reviewed.   ED Course  Procedures (including critical care  time) DIAGNOSTIC STUDIES: Oxygen Saturation is 96% on RA, normal by my interpretation.    COORDINATION OF CARE: 2:16 AM-Discussed treatment plan which includes labs and EKG with pt at bedside and pt agreed to plan.    Labs Review Labs Reviewed  COMPREHENSIVE METABOLIC PANEL - Abnormal; Notable for the following:    Glucose, Bld 108 (*)    BUN 22 (*)    Total Protein 6.4 (*)    Albumin 3.4 (*)    All other components within normal limits  CBC WITH DIFFERENTIAL/PLATELET  Randolm Idol, ED    Imaging Review Dg Chest 2 View  10/04/2015  CLINICAL DATA:  82 year old female with chest pain and shortness of breath EXAM: CHEST  2 VIEW COMPARISON:  Chest radiograph dated 09/17/2015 FINDINGS: Two views of the chest do not demonstrate a focal consolidation. There is no pleural effusion or pneumothorax. Stable vertebroplasty changes of the lower thoracic spine. No acute fracture. IMPRESSION: No active cardiopulmonary disease. Electronically Signed   By: Anner Crete M.D.   On: 10/04/2015 03:05   Ct Angio Chest Aorta W/cm &/or Wo/cm  10/04/2015  CLINICAL DATA:  Intermittent left shoulder and upper back pain since yesterday. Generalized weakness and fatigue. EXAM: CT ANGIOGRAPHY CHEST, ABDOMEN AND PELVIS TECHNIQUE: Multidetector CT imaging through the chest, abdomen and pelvis was performed using the standard protocol during bolus administration of intravenous contrast. Multiplanar reconstructed images and MIPs were obtained and reviewed to evaluate the vascular anatomy. CONTRAST:  100 mL Isovue 370 intravenous COMPARISON:  None. FINDINGS: CTA CHEST FINDINGS The thoracic aorta is normal in caliber. There is no dissection or aneurysm. Moderate non stenotic plaque is present at the origin of the left subclavian artery. The other great vessel origins are unremarkable. Proximal great vessels are patent. The pulmonary vasculature is also well opacified and is normal. Review of the MIP images confirms the  above findings. Nonvascular findings in the chest: The lungs are clear. Airways are patent and normal in caliber. There is no hilar or mediastinal adenopathy. There are no effusions. There is a large hiatal hernia. CTA ABDOMEN AND PELVIS FINDINGS The abdominal aorta is normal in  caliber with moderate atherosclerotic calcification. There is no dissection. The origins of the celiac and superior mesenteric artery are heavily calcified but only mildly stenotic with less than 50% diameter reduction. There are 2 right renal arteries, both patent although the main right renal artery does have bulky plaque at its origin. There is a single left renal artery, calcified at its origin but patent. Both common iliac arteries are widely patent and normal in caliber. Iliac bifurcations are patent bilaterally. External iliac arteries are patent bilaterally. Review of the MIP images confirms the above findings. Nonvascular findings in the abdomen and pelvis: There are normal appearances of the liver, gallbladder, bile ducts, pancreas, spleen and adrenals. There is a 4.2 cm benign cyst at the upper pole of the left kidney. There is a cortical scar at the upper pole of the right kidney. No suspicious parenchymal renal lesions are evident. The collecting systems and ureters are unremarkable bilaterally. Urinary bladder is unremarkable bilaterally. The stomach is remarkable only for the large hiatal hernia. Small bowel and colon are unremarkable. There is mild metal artifact from left hip prosthesis. No significant skeletal lesions are evident. Prior vertebral augmentation at T10. IMPRESSION: 1. Normal caliber thoracic and abdominal aorta. No dissection or aneurysm. Moderate plaque at the origin of many of the major branch vessels, but no critical stenoses. 2. No acute findings are evident in the chest, abdomen or pelvis. 3. Large hiatal hernia. Electronically Signed   By: Andreas Newport M.D.   On: 10/04/2015 05:33   Ct Angio  Chest/abd/pel For Dissection W And/or W/wo  10/04/2015  CLINICAL DATA:  Intermittent left shoulder and upper back pain since yesterday. Generalized weakness and fatigue. EXAM: CT ANGIOGRAPHY CHEST, ABDOMEN AND PELVIS TECHNIQUE: Multidetector CT imaging through the chest, abdomen and pelvis was performed using the standard protocol during bolus administration of intravenous contrast. Multiplanar reconstructed images and MIPs were obtained and reviewed to evaluate the vascular anatomy. CONTRAST:  100 mL Isovue 370 intravenous COMPARISON:  None. FINDINGS: CTA CHEST FINDINGS The thoracic aorta is normal in caliber. There is no dissection or aneurysm. Moderate non stenotic plaque is present at the origin of the left subclavian artery. The other great vessel origins are unremarkable. Proximal great vessels are patent. The pulmonary vasculature is also well opacified and is normal. Review of the MIP images confirms the above findings. Nonvascular findings in the chest: The lungs are clear. Airways are patent and normal in caliber. There is no hilar or mediastinal adenopathy. There are no effusions. There is a large hiatal hernia. CTA ABDOMEN AND PELVIS FINDINGS The abdominal aorta is normal in caliber with moderate atherosclerotic calcification. There is no dissection. The origins of the celiac and superior mesenteric artery are heavily calcified but only mildly stenotic with less than 50% diameter reduction. There are 2 right renal arteries, both patent although the main right renal artery does have bulky plaque at its origin. There is a single left renal artery, calcified at its origin but patent. Both common iliac arteries are widely patent and normal in caliber. Iliac bifurcations are patent bilaterally. External iliac arteries are patent bilaterally. Review of the MIP images confirms the above findings. Nonvascular findings in the abdomen and pelvis: There are normal appearances of the liver, gallbladder, bile ducts,  pancreas, spleen and adrenals. There is a 4.2 cm benign cyst at the upper pole of the left kidney. There is a cortical scar at the upper pole of the right kidney. No suspicious parenchymal renal lesions are  evident. The collecting systems and ureters are unremarkable bilaterally. Urinary bladder is unremarkable bilaterally. The stomach is remarkable only for the large hiatal hernia. Small bowel and colon are unremarkable. There is mild metal artifact from left hip prosthesis. No significant skeletal lesions are evident. Prior vertebral augmentation at T10. IMPRESSION: 1. Normal caliber thoracic and abdominal aorta. No dissection or aneurysm. Moderate plaque at the origin of many of the major branch vessels, but no critical stenoses. 2. No acute findings are evident in the chest, abdomen or pelvis. 3. Large hiatal hernia. Electronically Signed   By: Andreas Newport M.D.   On: 10/04/2015 05:33   I have personally reviewed and evaluated these images and lab results as part of my medical decision-making.   EKG Interpretation   Date/Time:  Friday Oct 04 2015 00:04:16 EDT Ventricular Rate:  66 PR Interval:  150 QRS Duration: 74 QT Interval:  428 QTC Calculation: 448 R Axis:   2 Text Interpretation:  Sinus rhythm with Premature atrial complexes Left  ventricular hypertrophy Abnormal ECG Confirmed by HORTON  MD, COURTNEY  [id number]) on 10/04/2015 2:00:52 AM      MDM   Final diagnoses:  Other chest pain    Patient presents with fatigue, chest and shoulder pain. Reports that this is similar to when she had to have her bypass. She is nontoxic. Vital signs reassuring. No evidence of ST elevation. Initial troponin is negative. Chest x-ray is reassuring. Patient's pain does radiate to her back. This could be concerning for possible dissection. CT angiogram obtained and negative for dissection. Patient's pain improved with nitroglycerin paste. She is currently pain-free. Heparin drip was ordered for  concerns for ACS. Will admit to the hospitalist and have cardiology evaluate.  I personally performed the services described in this documentation, which was scribed in my presence. The recorded information has been reviewed and is accurate.     Merryl Hacker, MD 10/04/15 [phone]

## 2015-10-04 NOTE — Progress Notes (Signed)
[facility] for Heparin  Indication: chest pain/ACS  Allergies  Allergen Reactions  . Sulfonamide Derivatives Hives    Patient Measurements: Height: 5\' 2"  (157.5 cm) Weight: 165 lb (74.844 kg) IBW/kg (Calculated) : 50.1  Vital Signs: Temp: 98.4 F (36.9 C) (05/12 1436) Temp Source: Oral (05/12 1436) BP: 156/65 mmHg (05/12 1436) Pulse Rate: 73 (05/12 1436)  Labs:  Recent Labs  10/04/15 0027 10/04/15 0816 10/04/15 1135 10/04/15 1452  HGB 12.4  --   --   --   HCT 38.7  --   --   --   PLT 260  --   --   --   HEPARINUNFRC  --   --   --  0.34  CREATININE 0.61  --   --   --   TROPONINI  --  <0.03 <0.03  --     Estimated Creatinine Clearance: 46 mL/min (by C-G formula based on Cr of 0.61).   Medical History: Past Medical History  Diagnosis Date  . Coronary artery disease     DR. Rollene Fare IS PT'S CARDIOLOGIST - HX CABG AND HAS STENT AND TOLD SHE HAS ANOTHER BLOCKAGE-BEING TREATED MEDICALLY  . High cholesterol   . Lung nodules     TOLD SHE HAS INTERSTITUAL LUNG DISEASE  . Shortness of breath     AND WHEEZING AT TIMES--NO INHALERS  . Vertigo   . Arthritis   . GERD (gastroesophageal reflux disease)   . Urinary incontinence   . UTI (lower urinary tract infection)     HX OF UTI'S-- LAST TIME COUPLE OF MONTHS AGO  . Hypothyroidism   . Blood transfusion 2007    AFTER HIP REPLACEMENT  . Fracture FEB 2013    FRACTURED LEFT ANKLE--NO SURGERY--WAS IN BOOT UNTIL COUPLE WEEKS AGO  . Depression   . Carotid bruit     PT'S DAUGHTER STATES PT HAD RECENT CAROTID STUDY AND WAS TOLD NO SIGNIFICANT BLOCKAGES  . [place] virus     COUPLE OF WEEKS AGO--ALL SYMTOMS RESOLVED PER PT  . Kidney stones     IN THE PAST  . Stroke ([place])     SEVERAL STROKES -TOLD SHE HAS CEREBELLUM BLOCKAGE AS RESULT OF STROKE--BUT NEUROLOGIST SAID HE DID NOT WANT TO DO ANY PROCEDURE BECAUSE OF HER AGE .  PT HAS SLIGHT LEFT FOOT DROP-DRAGS FOOT WHEN WALKING - USES WALKER   . CAD (coronary artery disease) of artery bypass graft 06/06/2014     2009 CABG 4, Dr. Servando Snare (LIMA to LAD , SVG to diagonal, SVG to circumflex, SVG to PDA  Cath 2012 in  Recent ill,[place] patent LIMA ,  Patent SVG to diagonal occluded SVG to RCA, report 5 x 18 BMS stent to native RCA  . Ischemic colitis ([place]) 2010  . Obstructive sleep apnea 2010  . CHF (congestive heart failure) ([facility])    Assessment: 82 y/o F with significant cardiac history, here with fatigue/chest pain/shoulder pain, POC troponin is negative, CT Angio with no acute findings in chest/abdomen/pelvis, CBC good, renal function good.   HL therapeutic x1 (0.34). CBC wnl, no bleed documented.   Goal of Therapy:  Heparin level 0.3-0.7 units/ml Monitor platelets by anticoagulation protocol: Yes   Plan:  -Continue heparin at 700 units/hr -8h HL to confirm -Daily HL/CBC -Monitor s/sx bleeding   Elicia Lamp, PharmD, BCPS Clinical Pharmacist Pager [phone] 10/04/2015 4:01 PM

## 2015-10-05 DIAGNOSIS — G459 Transient cerebral ischemic attack, unspecified: Secondary | ICD-10-CM | POA: Diagnosis not present

## 2015-10-05 DIAGNOSIS — N390 Urinary tract infection, site not specified: Secondary | ICD-10-CM | POA: Diagnosis present

## 2015-10-05 DIAGNOSIS — B952 Enterococcus as the cause of diseases classified elsewhere: Secondary | ICD-10-CM | POA: Diagnosis present

## 2015-10-05 DIAGNOSIS — Z7982 Long term (current) use of aspirin: Secondary | ICD-10-CM | POA: Diagnosis not present

## 2015-10-05 DIAGNOSIS — H539 Unspecified visual disturbance: Secondary | ICD-10-CM | POA: Diagnosis not present

## 2015-10-05 DIAGNOSIS — H547 Unspecified visual loss: Secondary | ICD-10-CM

## 2015-10-05 DIAGNOSIS — I10 Essential (primary) hypertension: Secondary | ICD-10-CM | POA: Diagnosis not present

## 2015-10-05 DIAGNOSIS — R0789 Other chest pain: Secondary | ICD-10-CM | POA: Diagnosis not present

## 2015-10-05 DIAGNOSIS — R32 Unspecified urinary incontinence: Secondary | ICD-10-CM | POA: Diagnosis present

## 2015-10-05 DIAGNOSIS — I69398 Other sequelae of cerebral infarction: Secondary | ICD-10-CM

## 2015-10-05 DIAGNOSIS — I519 Heart disease, unspecified: Secondary | ICD-10-CM

## 2015-10-05 DIAGNOSIS — Z8673 Personal history of transient ischemic attack (TIA), and cerebral infarction without residual deficits: Secondary | ICD-10-CM | POA: Diagnosis not present

## 2015-10-05 DIAGNOSIS — I200 Unstable angina: Secondary | ICD-10-CM | POA: Diagnosis not present

## 2015-10-05 DIAGNOSIS — E039 Hypothyroidism, unspecified: Secondary | ICD-10-CM | POA: Diagnosis present

## 2015-10-05 DIAGNOSIS — Z955 Presence of coronary angioplasty implant and graft: Secondary | ICD-10-CM | POA: Diagnosis not present

## 2015-10-05 DIAGNOSIS — E785 Hyperlipidemia, unspecified: Secondary | ICD-10-CM

## 2015-10-05 DIAGNOSIS — I739 Peripheral vascular disease, unspecified: Secondary | ICD-10-CM | POA: Diagnosis present

## 2015-10-05 DIAGNOSIS — Z882 Allergy status to sulfonamides status: Secondary | ICD-10-CM | POA: Diagnosis not present

## 2015-10-05 DIAGNOSIS — I25710 Atherosclerosis of autologous vein coronary artery bypass graft(s) with unstable angina pectoris: Secondary | ICD-10-CM | POA: Diagnosis present

## 2015-10-05 DIAGNOSIS — Z66 Do not resuscitate: Secondary | ICD-10-CM | POA: Diagnosis present

## 2015-10-05 DIAGNOSIS — I119 Hypertensive heart disease without heart failure: Secondary | ICD-10-CM | POA: Diagnosis present

## 2015-10-05 DIAGNOSIS — Z7902 Long term (current) use of antithrombotics/antiplatelets: Secondary | ICD-10-CM | POA: Diagnosis not present

## 2015-10-05 DIAGNOSIS — I25110 Atherosclerotic heart disease of native coronary artery with unstable angina pectoris: Secondary | ICD-10-CM | POA: Diagnosis not present

## 2015-10-05 DIAGNOSIS — Z951 Presence of aortocoronary bypass graft: Secondary | ICD-10-CM | POA: Diagnosis not present

## 2015-10-05 LAB — URINALYSIS, ROUTINE W REFLEX MICROSCOPIC
BILIRUBIN URINE: NEGATIVE
GLUCOSE, UA: NEGATIVE mg/dL
KETONES UR: NEGATIVE mg/dL
Nitrite: NEGATIVE
PH: 6 (ref 5.0–8.0)
Protein, ur: NEGATIVE mg/dL
SPECIFIC GRAVITY, URINE: 1.024 (ref 1.005–1.030)

## 2015-10-05 LAB — CBC
HEMATOCRIT: 36.3 % (ref 36.0–46.0)
Hemoglobin: 11.8 g/dL — ABNORMAL LOW (ref 12.0–15.0)
MCH: 30.5 pg (ref 26.0–34.0)
MCHC: 32.5 g/dL (ref 30.0–36.0)
MCV: 93.8 fL (ref 78.0–100.0)
PLATELETS: 242 10*3/uL (ref 150–400)
RBC: 3.87 MIL/uL (ref 3.87–5.11)
RDW: 13.1 % (ref 11.5–15.5)
WBC: 11.8 10*3/uL — AB (ref 4.0–10.5)

## 2015-10-05 LAB — HEPARIN LEVEL (UNFRACTIONATED)
Heparin Unfractionated: 0.26 IU/mL — ABNORMAL LOW (ref 0.30–0.70)
Heparin Unfractionated: 0.47 IU/mL (ref 0.30–0.70)

## 2015-10-05 LAB — URINE MICROSCOPIC-ADD ON

## 2015-10-05 MED ORDER — CIPROFLOXACIN HCL 500 MG PO TABS
250.0000 mg | ORAL_TABLET | Freq: Two times a day (BID) | ORAL | Status: AC
  Administered 2015-10-05 – 2015-10-07 (×6): 250 mg via ORAL
  Filled 2015-10-05 (×6): qty 1

## 2015-10-05 MED ORDER — ALBUTEROL SULFATE 108 (90 BASE) MCG/ACT IN AEPB: 2.0000 | INHALATION_SPRAY | Freq: Every day | RESPIRATORY_TRACT | Status: DC | PRN

## 2015-10-05 MED ORDER — ASPIRIN EC 81 MG PO TBEC
81.0000 mg | DELAYED_RELEASE_TABLET | Freq: Every evening | ORAL | Status: DC
  Administered 2015-10-05 – 2015-10-08 (×3): 81 mg via ORAL
  Filled 2015-10-05 (×4): qty 1

## 2015-10-05 MED ORDER — LEVOTHYROXINE SODIUM 75 MCG PO TABS
75.0000 ug | ORAL_TABLET | Freq: Every day | ORAL | Status: DC
  Administered 2015-10-05 – 2015-10-09 (×5): 75 ug via ORAL
  Filled 2015-10-05 (×6): qty 1

## 2015-10-05 MED ORDER — ALBUTEROL SULFATE (2.5 MG/3ML) 0.083% IN NEBU: 2.5000 mg | INHALATION_SOLUTION | Freq: Every day | RESPIRATORY_TRACT | Status: DC | PRN

## 2015-10-05 NOTE — Progress Notes (Signed)
[facility] [place] for Heparin  Indication: chest pain/ACS  Allergies  Allergen Reactions  . Sulfonamide Derivatives Hives    Patient Measurements: Height: 5\' 2"  (157.5 cm) Weight: 165 lb (74.844 kg) IBW/kg (Calculated) : 50.1  Vital Signs: Temp: 98.4 F (36.9 C) (05/12 1436) Temp Source: Oral (05/12 1436) BP: 156/65 mmHg (05/12 1436) Pulse Rate: 73 (05/12 1436)  Labs:  Recent Labs  10/04/15 0027 10/04/15 0816 10/04/15 1135 10/04/15 1452 10/04/15 2358  HGB 12.4  --   --   --   --   HCT 38.7  --   --   --   --   PLT 260  --   --   --   --   HEPARINUNFRC  --   --   --  0.34 0.26*  CREATININE 0.61  --   --   --   --   TROPONINI  --  <0.03 <0.03 <0.03  --     Estimated Creatinine Clearance: 46 mL/min (by C-G formula based on Cr of 0.61).   Assessment: 82 y/o F with significant cardiac history, here with fatigue/chest pain/shoulder pain, trop negative x 3, CT Angio with no acute findings in chest/abdomen/pelvis, CBC good, renal function good. HL is slightly low tonight. Cath Monday.   Goal of Therapy:  Heparin level 0.3-0.7 units/ml Monitor platelets by anticoagulation protocol: Yes   Plan:  -Inc heparin to 800 units/hr -0900 HL -Cath 5/15  Narda Bonds 10/05/2015,12:28 AM

## 2015-10-05 NOTE — Progress Notes (Signed)
ANTICOAGULATION CONSULT NOTE - Follow Up Consult  Pharmacy Consult for heparin Indication: chest pain/ACS  Allergies  Allergen Reactions  . Sulfonamide Derivatives Hives    Patient Measurements: Height: 5\' 2"  (157.5 cm) Weight: 165 lb 9.6 oz (75.116 kg) IBW/kg (Calculated) : 50.1 Heparin Dosing Weight: 66.3 kg  Vital Signs: Temp: 98.5 F (36.9 C) (05/13 0901) Temp Source: Oral (05/13 0901) BP: 120/47 mmHg (05/13 0901) Pulse Rate: 71 (05/13 0626)  Labs:  Recent Labs  10/04/15 0027 10/04/15 0816 10/04/15 1135 10/04/15 1452 10/04/15 2358 10/05/15 0933  HGB 12.4  --   --   --   --  11.8*  HCT 38.7  --   --   --   --  36.3  PLT 260  --   --   --   --  242  HEPARINUNFRC  --   --   --  0.34 0.26* 0.47  CREATININE 0.61  --   --   --   --   --   TROPONINI  --  <0.03 <0.03 <0.03  --   --     Estimated Creatinine Clearance: 46.1 mL/min (by C-G formula based on Cr of 0.61).   Medications:  Prescriptions prior to admission  Medication Sig Dispense Refill Last Dose  . acetaminophen (TYLENOL) 500 MG tablet Take 1,000 mg by mouth every 6 (six) hours as needed for mild pain.   10/03/2015 at Unknown time  . Albuterol Sulfate (PROAIR RESPICLICK) [id number] (90 Base) MCG/ACT AEPB Inhale 2 puffs into the lungs daily as needed (shortness of breath).   Past Week at Unknown time  . alendronate (FOSAMAX) 70 MG tablet Take 1 tablet (70 mg total) by mouth once a week. Take with a full glass of water on an empty stomach. (Patient taking differently: Take 70 mg by mouth once a week. Take with a full glass of water on an empty stomach.mon) 15 tablet 0 09/30/2015  . aspirin EC 81 MG tablet Take 81 mg by mouth every evening.    10/03/2015 at Unknown time  . Calcium Carb-Cholecalciferol (CALCIUM 1000 + D PO) Take 1 tablet by mouth daily.    10/03/2015 at Unknown time  . clopidogrel (PLAVIX) 75 MG tablet Take 1 tablet (75 mg total) by mouth daily. 90 tablet 3 10/03/2015 at Unknown time  . CRANBERRY EXTRACT PO  Take 500 mg by mouth daily.   10/03/2015 at Unknown time  . fish oil-omega-3 fatty acids 1000 MG capsule Take 1 g by mouth daily with breakfast.   10/03/2015 at Unknown time  . levothyroxine (SYNTHROID, LEVOTHROID) 75 MCG tablet Take 1 tablet (75 mcg total) by mouth daily before breakfast. 90 tablet 0 10/03/2015 at Unknown time  . meclizine (ANTIVERT) 25 MG tablet Take 25 mg by mouth 3 (three) times daily as needed for dizziness.   unk  . mirabegron ER (MYRBETRIQ) 50 MG TB24 tablet Take 1 tablet (50 mg total) by mouth 2 (two) times daily. 180 tablet 0 10/03/2015 at Unknown time  . Multiple Vitamin (MULITIVITAMIN WITH MINERALS) TABS Take 1 tablet by mouth daily with breakfast.   10/03/2015 at Unknown time  . ondansetron (ZOFRAN ODT) 4 MG disintegrating tablet Take one every 6 hours for nausea 12 tablet 0 unk  . OVER THE COUNTER MEDICATION Take 2 capsules by mouth daily. Immuplex Immune Booster. Takes during the winter months   10/03/2015 at Unknown time  . pantoprazole (PROTONIX) 40 MG tablet Take 1 tablet (40 mg total) by mouth daily. [place]  tablet 3 10/03/2015 at Unknown time  . Polyethyl Glycol-Propyl Glycol (SYSTANE OP) Place 1 drop into both eyes 3 (three) times daily. Patient states its the Gel form. Not sure what percentage. Take every day per patient   10/03/2015 at Unknown time  . Probiotic Product (PROBIOTIC PO) Take 1 tablet by mouth daily.   10/03/2015 at Unknown time  . rosuvastatin (CRESTOR) 20 MG tablet Take 1 tablet (20 mg total) by mouth daily. 90 tablet 0 10/03/2015 at Unknown time  . sertraline (ZOLOFT) 50 MG tablet 1.5 tablets by mouth every day (Patient taking differently: Take 50 mg by mouth daily. ) 135 tablet 0 10/03/2015 at Unknown time    Assessment: 82 yo F admitted 10/04/2015 with fatigue/chest pain and shoulder pain. Troponin negative x3 and CT angio with no acute findings. Pharmacy consulted to dose heparin. Cath planned for Monday.  HL 0.47 (therapeutic), Hgb 11.8, Plt 242. No s/sx of  bleeding noted.  Goal of Therapy:  Heparin level 0.3-0.7 units/ml Monitor platelets by anticoagulation protocol: Yes   Plan:  - Continue heparin 800 units/hr - Monitor daily HL, CBC and s/sx of bleeding  Dimitri Ped, PharmD. PGY-1 Pharmacy Resident Pager: [phone] 10/05/2015,11:07 AM

## 2015-10-05 NOTE — Progress Notes (Signed)
Subjective:  Weak when up but no further pain between her shoulder blades (presenting symptoms)  Objective:  Vital Signs in the last 24 hours: Temp:  [98 F (36.7 C)-98.5 F (36.9 C)] 98.5 F (36.9 C) (05/13 0901) Pulse Rate:  [71-78] 71 (05/13 0626) Resp:  [16-23] 17 (05/13 0626) BP: (101-167)/(47-107) 120/47 mmHg (05/13 0901) SpO2:  [93 %-98 %] 94 % (05/13 0901) Weight:  [165 lb (74.844 kg)-165 lb 9.6 oz (75.116 kg)] 165 lb 9.6 oz (75.116 kg) (05/13 0429)  Intake/Output from previous day:  Intake/Output Summary (Last 24 hours) at 10/05/15 1118 Last data filed at 10/05/15 0916  Gross per 24 hour  Intake 674.52 ml  Output    575 ml  Net  99.52 ml    Physical Exam: General appearance: alert, cooperative, no distress and mildly obese Lungs: clear to auscultation bilaterally Heart: regular rate and rhythm Skin: pale cool dry Neurologic: Grossly normal   Rate: 72  Rhythm: normal sinus rhythm  Lab Results:  Recent Labs  10/04/15 0027 10/05/15 0933  WBC 9.8 11.8*  HGB 12.4 11.8*  PLT 260 242    Recent Labs  10/04/15 0027  NA 137  K 4.2  CL 101  CO2 25  GLUCOSE 108*  BUN 22*  CREATININE 0.61    Recent Labs  10/04/15 1135 10/04/15 1452  TROPONINI <0.03 <0.03   No results for input(s): INR in the last 72 hours.  Scheduled Meds: . clopidogrel  75 mg Oral Daily  . levothyroxine  75 mcg Oral QAC breakfast  . losartan  25 mg Oral Daily  . pantoprazole  40 mg Oral Daily  . rosuvastatin  20 mg Oral Daily  . sertraline  50 mg Oral Daily   Continuous Infusions: . heparin 800 Units/hr (10/05/15 0916)   PRN Meds:.acetaminophen, gi cocktail, ondansetron (ZOFRAN) IV   Imaging: Imaging results have been reviewed   Assessment/Plan:  82 y.o. female with a past medical history significant for CAD s/p CABG in 2009, PCI to graft in 2012, hx of CVA and PVD, and hypothyroidism who presented 10/04/15 with [place]. She has ruled out for an MI. She has been  stable on Heparin and NTG paste. She does apparently have a UTI with frequency, dysuria, and bacteria and leukocytes in her urine.   Principal Problem:   Unstable angina ([place]) Active Problems:   Hypertension   S/P CABG 2009   S/P coronary artery stent placement-2012   UTI (urinary tract infection)   Hypothyroidism   Diastolic dysfunction-grade 1 with EF 60%   Hyperlipidemia   History of stroke-2014   Hypertensive heart disease without heart failure   PLAN: Cath planned for Monday. Will obtain urine culture, check PVR, and treat UTI with Cipro (sulfa allergy). Resume ASA.   Kerin Ransom PA-C 10/05/2015, 11:18 AM [phone]  I have seen and examined the patient along with Kerin Ransom PA-C.  I have reviewed the chart, notes and new data.  I agree with PA's note.  Key new complaints: no angina at rest or walking in room, lying comfortably fully supine Key examination changes: no signs of volume overload, still with small left visual field cut  Key new findings / data: normal cardiac enzymes  PLAN: In 2009 she underwent four-vessel bypass surgery. In 2012 she had chest pain while in [facility] and cardiac catheterization showed occlusion of the SVG to RCA and a stent was placed in the 95% stenosis of the native RCA. There was also 90%  stenosis of the left main coronary artery with a patent LIMA to the LAD and a patent SVG to the diagonal. There is no mention of the SVG to OM (OM is described as a moderate size vessel with a 75% proximal stenosis). A nuclear stress test in 2013 was normal and she has normal left ventricular systolic function by echo as well. Echo in 2013 showed evidence of diastolic dysfunction as well as elevated filling pressures although congestive heart failure has not been a clinically evident problem. Had a small right occipital stroke in March 2017. 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  Coronary and graft angiography Monday with Dr. Martinique. Already on ASA, clopidogrel and heparin IV. This procedure has been fully reviewed with the patient and informed consent has been obtained. Treat for UTI. Monitor for urinary retention.   Sanda Klein, MD, [place] [phone] 10/05/2015, 11:56 AM

## 2015-10-06 DIAGNOSIS — I10 Essential (primary) hypertension: Secondary | ICD-10-CM

## 2015-10-06 LAB — CBC
HCT: 33.9 % — ABNORMAL LOW (ref 36.0–46.0)
HEMOGLOBIN: 11.4 g/dL — AB (ref 12.0–15.0)
MCH: 31.5 pg (ref 26.0–34.0)
MCHC: 33.6 g/dL (ref 30.0–36.0)
MCV: 93.6 fL (ref 78.0–100.0)
Platelets: 224 10*3/uL (ref 150–400)
RBC: 3.62 MIL/uL — AB (ref 3.87–5.11)
RDW: 12.9 % (ref 11.5–15.5)
WBC: 9.7 10*3/uL (ref 4.0–10.5)

## 2015-10-06 LAB — HEPARIN LEVEL (UNFRACTIONATED): HEPARIN UNFRACTIONATED: 0.53 [IU]/mL (ref 0.30–0.70)

## 2015-10-06 MED ORDER — SODIUM CHLORIDE 0.9% FLUSH
3.0000 mL | Freq: Two times a day (BID) | INTRAVENOUS | Status: DC
  Administered 2015-10-07: 3 mL via INTRAVENOUS

## 2015-10-06 MED ORDER — SODIUM CHLORIDE 0.9 % WEIGHT BASED INFUSION
3.0000 mL/kg/h | INTRAVENOUS | Status: DC
  Administered 2015-10-07: 3 mL/kg/h via INTRAVENOUS

## 2015-10-06 MED ORDER — SODIUM CHLORIDE 0.9 % IV SOLN: 250.0000 mL | INTRAVENOUS | Status: DC | PRN

## 2015-10-06 MED ORDER — SODIUM CHLORIDE 0.9% FLUSH: 3.0000 mL | INTRAVENOUS | Status: DC | PRN

## 2015-10-06 MED ORDER — ASPIRIN 81 MG PO CHEW
81.0000 mg | CHEWABLE_TABLET | ORAL | Status: AC
  Administered 2015-10-07: 81 mg via ORAL
  Filled 2015-10-06: qty 1

## 2015-10-06 MED ORDER — SODIUM CHLORIDE 0.9 % WEIGHT BASED INFUSION
1.0000 mL/kg/h | INTRAVENOUS | Status: DC
  Administered 2015-10-07 (×2): 1 mL/kg/h via INTRAVENOUS

## 2015-10-06 NOTE — Progress Notes (Signed)
ANTICOAGULATION CONSULT NOTE - Follow Up Consult  Pharmacy Consult for heparin Indication: chest pain/ACS  Allergies  Allergen Reactions  . Sulfonamide Derivatives Hives    Patient Measurements: Height: 5\' 2"  (157.5 cm) Weight: 166 lb 8 oz (75.524 kg) IBW/kg (Calculated) : 50.1 Heparin Dosing Weight: 66 kg  Vital Signs: Temp: 98.4 F (36.9 C) (05/14 0524) Temp Source: Oral (05/14 0524) BP: 134/76 mmHg (05/14 0952) Pulse Rate: 69 (05/14 0952)  Labs:  Recent Labs  10/04/15 0027 10/04/15 0816 10/04/15 1135  10/04/15 1452 10/04/15 2358 10/05/15 0933 10/06/15 0402 10/06/15 0901  HGB 12.4  --   --   --   --   --  11.8* 11.4*  --   HCT 38.7  --   --   --   --   --  36.3 33.9*  --   PLT 260  --   --   --   --   --  242 224  --   HEPARINUNFRC  --   --   --   < > 0.34 0.26* 0.47  --  0.53  CREATININE 0.61  --   --   --   --   --   --   --   --   TROPONINI  --  <0.03 <0.03  --  <0.03  --   --   --   --   < > = values in this interval not displayed.  Estimated Creatinine Clearance: 46.3 mL/min (by C-G formula based on Cr of 0.61).  Assessment: 82 yo F admitted 10/04/2015 with fatigue/chest pain and shoulder pain. Troponin negative x3 and CT angio with no acute findings. Pharmacy consulted to dose heparin. Cath planned for Monday.  HL 0.53 (therapeutic), Hgb 11.4, Plt wnl. No s/sx of bleeding noted.  Goal of Therapy:  Heparin level 0.3-0.7 units/ml Monitor platelets by anticoagulation protocol: Yes   Plan:  - Continue heparin 800 units/hr - Monitor daily HL, CBC and s/sx of bleeding - F/u cath plans for Monday   Dimitri Ped, PharmD. PGY-1 Pharmacy Resident Pager: [phone] 10/06/2015,10:11 AM

## 2015-10-06 NOTE — Progress Notes (Signed)
Subjective:  No chest pain overnight, no trouble voiding this am  Objective:  Vital Signs in the last 24 hours: Temp:  [98.4 F (36.9 C)-98.9 F (37.2 C)] 98.4 F (36.9 C) (05/14 0524) Pulse Rate:  [62-71] 62 (05/14 0524) Resp:  [18] 18 (05/14 0524) BP: (107-144)/(46-59) 107/47 mmHg (05/14 0524) SpO2:  [94 %-96 %] 95 % (05/14 0524) Weight:  [166 lb 8 oz (75.524 kg)] 166 lb 8 oz (75.524 kg) (05/14 0524)  Intake/Output from previous day:  Intake/Output Summary (Last 24 hours) at 10/06/15 0757 Last data filed at 10/06/15 0500  Gross per 24 hour  Intake 952.14 ml  Output    400 ml  Net 552.14 ml    Physical Exam: General appearance: alert, cooperative, no distress and mildly obese Lungs: clear to auscultation bilaterally Heart: regular rate and rhythm Skin: pale cool dry Neurologic: Grossly normal 2+/4 FA pulses bilateraly   Rate: 72  Rhythm: normal sinus rhythm  Lab Results:  Recent Labs  10/05/15 0933 10/06/15 0402  WBC 11.8* 9.7  HGB 11.8* 11.4*  PLT 242 224    Recent Labs  10/04/15 0027  NA 137  K 4.2  CL 101  CO2 25  GLUCOSE 108*  BUN 22*  CREATININE 0.61    Recent Labs  10/04/15 1135 10/04/15 1452  TROPONINI <0.03 <0.03   No results for input(s): INR in the last 72 hours.  Scheduled Meds: . aspirin EC  81 mg Oral QPM  . ciprofloxacin  250 mg Oral BID  . clopidogrel  75 mg Oral Daily  . levothyroxine  75 mcg Oral QAC breakfast  . losartan  25 mg Oral Daily  . pantoprazole  40 mg Oral Daily  . rosuvastatin  20 mg Oral Daily  . sertraline  50 mg Oral Daily   Continuous Infusions: . heparin 800 Units/hr (10/05/15 0916)   PRN Meds:.acetaminophen, albuterol, gi cocktail, ondansetron (ZOFRAN) IV   Imaging: Imaging results have been reviewed   Assessment/Plan:  82 y.o. female with a past medical history significant for CAD s/p CABG in 2009, PCI to graft in 2012, hx of CVA and PVD, and hypothyroidism who presented 10/04/15 with  [place]. She has ruled out for an MI. She has been stable on Heparin and NTG paste. She is also being treated for UTI with Cipro 205 mg BID x 3 days. .   Principal Problem:   Unstable angina ([facility]) Active Problems:   Hypertension   S/P CABG 2009   S/P coronary artery stent placement-2012   UTI (urinary tract infection)   Hypothyroidism   Diastolic dysfunction-grade 1 with EF 60%   Hyperlipidemia   History of stroke-2014   Hypertensive heart disease without heart failure   PLAN: Cath planned for Monday, orders written. Urine culture pending.  Kerin Ransom PA-C 10/06/2015, 7:57 AM [phone]  I have seen and examined the patient along with Kerin Ransom PA-C.  I have reviewed the chart, notes and new data.  I agree with PA/NP's note.  Key new complaints: urinary symptoms are better Key examination changes: no signs of CHF or arrhythmia  2009 - CABG x 4. 2012 - [place], [place]: cardiac cath showed occlusion of the SVG to RCA; stent for 95% stenosis of native RCA. There was also 90% stenosis of the left main coronary artery, patent LIMA to the LAD, patent SVG to diagonal. There is no mention of the 4th SVG to OM (OM is described as a moderate size vessel with a  75% proximal stenosis).  2013 - nuclear stress test normal, echo with normal LVEF and evidence of diastolic dysfunctionand elevated filling pressures although congestive heart failure has not been a clinically evident problem.  2017 - small right occipital stroke in March 2017. 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  PLAN: Coronary and graft angiography Monday with Dr. Martinique. Already on ASA, clopidogrel and heparin IV. This procedure has been fully reviewed with the patient and informed consent has been obtained. Monitor for urinary retention.  Sanda Klein, MD, [place] [phone] 10/06/2015, 8:46 AM

## 2015-10-07 ENCOUNTER — Encounter (HOSPITAL_COMMUNITY)
Admission: EM | Disposition: A | Discharge status: Home Health | Payer: Self-pay | Source: Home / Self Care | Attending: Cardiovascular Disease

## 2015-10-07 DIAGNOSIS — I25110 Atherosclerotic heart disease of native coronary artery with unstable angina pectoris: Secondary | ICD-10-CM

## 2015-10-07 HISTORY — PX: CARDIAC CATHETERIZATION: SHX172

## 2015-10-07 LAB — CBC
HCT: 34.8 % — ABNORMAL LOW (ref 36.0–46.0)
HEMOGLOBIN: 11.3 g/dL — AB (ref 12.0–15.0)
MCH: 30.3 pg (ref 26.0–34.0)
MCHC: 32.5 g/dL (ref 30.0–36.0)
MCV: 93.3 fL (ref 78.0–100.0)
Platelets: 217 10*3/uL (ref 150–400)
RBC: 3.73 MIL/uL — AB (ref 3.87–5.11)
RDW: 13 % (ref 11.5–15.5)
WBC: 10.2 10*3/uL (ref 4.0–10.5)

## 2015-10-07 LAB — BASIC METABOLIC PANEL
Anion gap: 6 (ref 5–15)
BUN: 9 mg/dL (ref 6–20)
CALCIUM: 8.3 mg/dL — AB (ref 8.9–10.3)
CHLORIDE: 105 mmol/L (ref 101–111)
CO2: 26 mmol/L (ref 22–32)
CREATININE: 0.54 mg/dL (ref 0.44–1.00)
GFR calc non Af Amer: >60 mL/min (ref >60)
Glucose, Bld: 98 mg/dL (ref 65–99)
Potassium: 3.9 mmol/L (ref 3.5–5.1)
SODIUM: 137 mmol/L (ref 135–145)

## 2015-10-07 LAB — PROTIME-INR
INR: 1.07 (ref 0.00–1.49)
Prothrombin Time: 14.1 seconds (ref 11.6–15.2)

## 2015-10-07 LAB — HEPARIN LEVEL (UNFRACTIONATED): HEPARIN UNFRACTIONATED: 0.46 [IU]/mL (ref 0.30–0.70)

## 2015-10-07 SURGERY — LEFT HEART CATH AND CORS/GRAFTS ANGIOGRAPHY

## 2015-10-07 MED ORDER — VERAPAMIL HCL 2.5 MG/ML IV SOLN
INTRAVENOUS | Status: DC | PRN
  Administered 2015-10-07: 10 mL via INTRA_ARTERIAL

## 2015-10-07 MED ORDER — ISOSORBIDE MONONITRATE ER 30 MG PO TB24
15.0000 mg | ORAL_TABLET | Freq: Every day | ORAL | Status: DC
  Administered 2015-10-07 – 2015-10-09 (×3): 15 mg via ORAL
  Filled 2015-10-07 (×3): qty 1

## 2015-10-07 MED ORDER — SODIUM CHLORIDE 0.9 % WEIGHT BASED INFUSION
1.0000 mL/kg/h | INTRAVENOUS | Status: AC
  Administered 2015-10-07: 1 mL/kg/h via INTRAVENOUS

## 2015-10-07 MED ORDER — METOPROLOL SUCCINATE ER 25 MG PO TB24
25.0000 mg | ORAL_TABLET | Freq: Every day | ORAL | Status: DC
  Administered 2015-10-07 – 2015-10-09 (×3): 25 mg via ORAL
  Filled 2015-10-07 (×3): qty 1

## 2015-10-07 MED ORDER — HEPARIN SODIUM (PORCINE) 1000 UNIT/ML IJ SOLN
INTRAMUSCULAR | Status: AC
  Filled 2015-10-07: qty 1

## 2015-10-07 MED ORDER — SODIUM CHLORIDE 0.9% FLUSH: 3.0000 mL | INTRAVENOUS | Status: DC | PRN

## 2015-10-07 MED ORDER — SODIUM CHLORIDE 0.9 % IV SOLN: 250.0000 mL | INTRAVENOUS | Status: DC | PRN

## 2015-10-07 MED ORDER — HEPARIN SODIUM (PORCINE) 1000 UNIT/ML IJ SOLN
INTRAMUSCULAR | Status: DC | PRN
  Administered 2015-10-07: 4000 [IU] via INTRAVENOUS

## 2015-10-07 MED ORDER — VERAPAMIL HCL 2.5 MG/ML IV SOLN
INTRAVENOUS | Status: AC
  Filled 2015-10-07: qty 2

## 2015-10-07 MED ORDER — MIDAZOLAM HCL 2 MG/2ML IJ SOLN
INTRAMUSCULAR | Status: DC | PRN
  Administered 2015-10-07: 1 mg via INTRAVENOUS

## 2015-10-07 MED ORDER — LIDOCAINE HCL (PF) 1 % IJ SOLN
INTRAMUSCULAR | Status: AC
  Filled 2015-10-07: qty 30

## 2015-10-07 MED ORDER — SODIUM CHLORIDE 0.9% FLUSH
3.0000 mL | Freq: Two times a day (BID) | INTRAVENOUS | Status: DC
  Administered 2015-10-08 – 2015-10-09 (×3): 3 mL via INTRAVENOUS

## 2015-10-07 MED ORDER — IOPAMIDOL (ISOVUE-370) INJECTION 76%
INTRAVENOUS | Status: AC
Start: 2015-10-07 — End: 2015-10-07
  Filled 2015-10-07: qty 100

## 2015-10-07 MED ORDER — HEPARIN (PORCINE) IN NACL 2-0.9 UNIT/ML-% IJ SOLN
INTRAMUSCULAR | Status: AC
  Filled 2015-10-07: qty 1000

## 2015-10-07 MED ORDER — MIDAZOLAM HCL 2 MG/2ML IJ SOLN
INTRAMUSCULAR | Status: AC
  Filled 2015-10-07: qty 2

## 2015-10-07 MED ORDER — IOPAMIDOL (ISOVUE-370) INJECTION 76%
INTRAVENOUS | Status: AC
  Filled 2015-10-07: qty 100

## 2015-10-07 MED ORDER — LIDOCAINE HCL (PF) 1 % IJ SOLN
INTRAMUSCULAR | Status: DC | PRN
  Administered 2015-10-07: 2 mL

## 2015-10-07 MED ORDER — HEPARIN (PORCINE) IN NACL 2-0.9 UNIT/ML-% IJ SOLN
INTRAMUSCULAR | Status: DC | PRN
  Administered 2015-10-07: 15:00:00

## 2015-10-07 MED ORDER — HEPARIN (PORCINE) IN NACL 2-0.9 UNIT/ML-% IJ SOLN
INTRAMUSCULAR | Status: AC
  Filled 2015-10-07: qty 500

## 2015-10-07 MED ORDER — IOPAMIDOL (ISOVUE-370) INJECTION 76%
INTRAVENOUS | Status: DC | PRN
  Administered 2015-10-07: 110 mL

## 2015-10-07 SURGICAL SUPPLY — 13 items
CATH EXPO 5F MPA-1 (CATHETERS) ×2 IMPLANT
CATH INFINITI 5 FR 3DRC (CATHETERS) ×2 IMPLANT
CATH INFINITI 5 FR IM (CATHETERS) ×2 IMPLANT
CATH INFINITI 5FR AL1 (CATHETERS) ×2 IMPLANT
CATH INFINITI 5FR MULTPACK ANG (CATHETERS) ×2 IMPLANT
DEVICE RAD COMP TR BAND LRG (VASCULAR PRODUCTS) ×2 IMPLANT
GLIDESHEATH SLEND SS 6F .021 (SHEATH) ×2 IMPLANT
KIT HEART LEFT (KITS) ×3 IMPLANT
PACK CARDIAC CATHETERIZATION (CUSTOM PROCEDURE TRAY) ×3 IMPLANT
TRANSDUCER W/STOPCOCK (MISCELLANEOUS) ×3 IMPLANT
TUBING CIL FLEX 10 FLL-RA (TUBING) ×3 IMPLANT
WIRE HI TORQ VERSACORE-J 145CM (WIRE) ×2 IMPLANT
WIRE SAFE-T 1.5MM-J .035X260CM (WIRE) ×4 IMPLANT

## 2015-10-07 NOTE — Progress Notes (Signed)
    Subjective:  No chest pain overnight, no trouble voiding this am  Objective:  Vital Signs in the last 24 hours: Temp:  [97.9 F (36.6 C)-99 F (37.2 C)] 98.1 F (36.7 C) (05/15 [id number]) Pulse Rate:  [69] 69 (05/15 0608) Resp:  [18] 18 (05/15 0608) BP: (131-135)/(59-77) 133/59 mmHg (05/15 0608) SpO2:  [92 %-98 %] 98 % (05/15 0608) Weight:  [167 lb 9.6 oz (76.023 kg)] 167 lb 9.6 oz (76.023 kg) (05/15 [id number])  Intake/Output from previous day:  Intake/Output Summary (Last 24 hours) at 10/07/15 0905 Last data filed at 10/07/15 0749  Gross per 24 hour  Intake    740 ml  Output   2550 ml  Net  -1810 ml    Physical Exam: General appearance: alert, cooperative, no distress and mildly obese Lungs: clear to auscultation bilaterally Heart: regular rate and rhythm Skin: pale cool dry Neurologic: Grossly normal 2+/4 FA pulses bilateraly   Rate: 72  Rhythm: normal sinus rhythm  Lab Results:  Recent Labs  10/06/15 0402 10/07/15 0307  WBC 9.7 10.2  HGB 11.4* 11.3*  PLT 224 217    Recent Labs  10/07/15 0307  NA 137  K 3.9  CL 105  CO2 26  GLUCOSE 98  BUN 9  CREATININE 0.54    Recent Labs  10/04/15 1135 10/04/15 1452  TROPONINI <0.03 <0.03    Recent Labs  10/07/15 0307  INR 1.07    Scheduled Meds: . aspirin  81 mg Oral Pre-Cath  . aspirin EC  81 mg Oral QPM  . ciprofloxacin  250 mg Oral BID  . clopidogrel  75 mg Oral Daily  . levothyroxine  75 mcg Oral QAC breakfast  . losartan  25 mg Oral Daily  . pantoprazole  40 mg Oral Daily  . rosuvastatin  20 mg Oral Daily  . sertraline  50 mg Oral Daily  . sodium chloride flush  3 mL Intravenous Q12H   Continuous Infusions: . sodium chloride    . heparin 800 Units/hr (10/06/15 1827)   PRN Meds:.sodium chloride, acetaminophen, albuterol, gi cocktail, ondansetron (ZOFRAN) IV, sodium chloride flush   Imaging: Imaging results have been reviewed   Assessment/Plan:  82 y.o. female with a past medical  history significant for CAD s/p CABG in 2009, PCI to graft in 2012, hx of CVA and PVD, and hypothyroidism who presented 10/04/15 with [place]. She has ruled out for an MI. She has been stable on Heparin and NTG paste. She is also being treated for UTI with Cipro 205 mg BID x 3 days. .   Principal Problem:   Unstable angina ([facility]) Active Problems:   Hypertension   S/P CABG 2009   S/P coronary artery stent placement-2012   UTI (urinary tract infection)   Hypothyroidism   Diastolic dysfunction-grade 1 with EF 60%   Hyperlipidemia   History of stroke-March 2017 (rt brain)   Hypertensive heart disease without heart failure   PLAN: Cath today.  Urine culture pending.  Gina Ransom PA-C 10/07/2015, 9:05 AM [phone]  I have seen and examined the patient along with Gina Ransom PA-C.  I have reviewed the chart, notes and new data.  I agree with PA's note.  For cath +/- PCI today.This procedure has been fully reviewed with the patient and written informed consent has been obtained.  Gina Klein, MD, [place] [phone] 10/07/2015, 10:58 AM

## 2015-10-07 NOTE — Progress Notes (Signed)
ANTICOAGULATION CONSULT NOTE - Follow Up Consult  Pharmacy Consult for heparin Indication: chest pain/ACS  Allergies  Allergen Reactions  . Sulfonamide Derivatives Hives    Patient Measurements: Height: 5\' 2"  (157.5 cm) Weight: 167 lb 9.6 oz (76.023 kg) (scale a) IBW/kg (Calculated) : 50.1 Heparin Dosing Weight: 66 kg  Vital Signs: Temp: 98.1 F (36.7 C) (05/15 0608) Temp Source: Oral (05/15 0608) BP: 133/59 mmHg (05/15 0608) Pulse Rate: 69 (05/15 0608)  Labs:  Recent Labs  10/04/15 1135 10/04/15 1452  10/05/15 0933 10/06/15 0402 10/06/15 0901 10/07/15 0307  HGB  --   --   < > 11.8* 11.4*  --  11.3*  HCT  --   --   --  36.3 33.9*  --  34.8*  PLT  --   --   --  242 224  --  217  LABPROT  --   --   --   --   --   --  14.1  INR  --   --   --   --   --   --  1.07  HEPARINUNFRC  --  0.34  < > 0.47  --  0.53 0.46  CREATININE  --   --   --   --   --   --  0.54  TROPONINI <0.03 <0.03  --   --   --   --   --   < > = values in this interval not displayed.  Estimated Creatinine Clearance: 46.4 mL/min (by C-G formula based on Cr of 0.54).  Assessment: 82 yo F admitted 10/04/2015 with fatigue/chest pain and shoulder pain. Troponin negative x3 and CT angio with no acute findings. Pharmacy consulted to dose heparin. Cath planned for today   HL 0.46 (therapeutic), Hgb stable, Plt wnl. No s/sx of bleeding noted.  Goal of Therapy:  Heparin level 0.3-0.7 units/ml Monitor platelets by anticoagulation protocol: Yes   Plan:  - Continue heparin 800 units/hr - Monitor daily HL, CBC and s/sx of bleeding - F/u after cath   Albertina Parr, PharmD., BCPS Clinical Pharmacist Pager [phone]

## 2015-10-07 NOTE — H&P (View-Only) (Signed)
    Subjective:  No chest pain overnight, no trouble voiding this am  Objective:  Vital Signs in the last 24 hours: Temp:  [97.9 F (36.6 C)-99 F (37.2 C)] 98.1 F (36.7 C) (05/15 [id number]) Pulse Rate:  [69] 69 (05/15 0608) Resp:  [18] 18 (05/15 0608) BP: (131-135)/(59-77) 133/59 mmHg (05/15 0608) SpO2:  [92 %-98 %] 98 % (05/15 0608) Weight:  [167 lb 9.6 oz (76.023 kg)] 167 lb 9.6 oz (76.023 kg) (05/15 [id number])  Intake/Output from previous day:  Intake/Output Summary (Last 24 hours) at 10/07/15 0905 Last data filed at 10/07/15 0749  Gross per 24 hour  Intake    740 ml  Output   2550 ml  Net  -1810 ml    Physical Exam: General appearance: alert, cooperative, no distress and mildly obese Lungs: clear to auscultation bilaterally Heart: regular rate and rhythm Skin: pale cool dry Neurologic: Grossly normal 2+/4 FA pulses bilateraly   Rate: 72  Rhythm: normal sinus rhythm  Lab Results:  Recent Labs  10/06/15 0402 10/07/15 0307  WBC 9.7 10.2  HGB 11.4* 11.3*  PLT 224 217    Recent Labs  10/07/15 0307  NA 137  K 3.9  CL 105  CO2 26  GLUCOSE 98  BUN 9  CREATININE 0.54    Recent Labs  10/04/15 1135 10/04/15 1452  TROPONINI <0.03 <0.03    Recent Labs  10/07/15 0307  INR 1.07    Scheduled Meds: . aspirin  81 mg Oral Pre-Cath  . aspirin EC  81 mg Oral QPM  . ciprofloxacin  250 mg Oral BID  . clopidogrel  75 mg Oral Daily  . levothyroxine  75 mcg Oral QAC breakfast  . losartan  25 mg Oral Daily  . pantoprazole  40 mg Oral Daily  . rosuvastatin  20 mg Oral Daily  . sertraline  50 mg Oral Daily  . sodium chloride flush  3 mL Intravenous Q12H   Continuous Infusions: . sodium chloride    . heparin 800 Units/hr (10/06/15 1827)   PRN Meds:.sodium chloride, acetaminophen, albuterol, gi cocktail, ondansetron (ZOFRAN) IV, sodium chloride flush   Imaging: Imaging results have been reviewed   Assessment/Plan:  82 y.o. female with a past medical  history significant for CAD s/p CABG in 2009, PCI to graft in 2012, hx of CVA and PVD, and hypothyroidism who presented 10/04/15 with [place]. She has ruled out for an MI. She has been stable on Heparin and NTG paste. She is also being treated for UTI with Cipro 205 mg BID x 3 days. .   Principal Problem:   Unstable angina ([facility]) Active Problems:   Hypertension   S/P CABG 2009   S/P coronary artery stent placement-2012   UTI (urinary tract infection)   Hypothyroidism   Diastolic dysfunction-grade 1 with EF 60%   Hyperlipidemia   History of stroke-March 2017 (rt brain)   Hypertensive heart disease without heart failure   PLAN: Cath today.  Urine culture pending.  Kerin Ransom PA-C 10/07/2015, 9:05 AM [phone]  I have seen and examined the patient along with Kerin Ransom PA-C.  I have reviewed the chart, notes and new data.  I agree with PA's note.  For cath +/- PCI today.This procedure has been fully reviewed with the patient and written informed consent has been obtained.  Sanda Klein, MD, [place] [phone] 10/07/2015, 10:58 AM

## 2015-10-07 NOTE — Interval H&P Note (Signed)
History and Physical Interval Note:  10/07/2015 1:45 PM  Gina Bowers  has presented today for surgery, with the diagnosis of [place]  The various methods of treatment have been discussed with the patient and family. After consideration of risks, benefits and other options for treatment, the patient has consented to  Procedure(s): Left Heart Cath and Coronary Angiography (N/A) as a surgical intervention .  The patient's history has been reviewed, patient examined, no change in status, stable for surgery.  I have reviewed the patient's chart and labs.  Questions were answered to the patient's satisfaction.   Cath Lab Visit (complete for each Cath Lab visit)  Clinical Evaluation Leading to the Procedure:   ACS: Yes.    Non-ACS:    Anginal Classification: CCS IV  Anti-ischemic medical therapy: Minimal Therapy (1 class of medications)  Non-Invasive Test Results: No non-invasive testing performed  Prior CABG: Previous CABG        Collier Salina [facility] 10/07/2015 1:46 PM

## 2015-10-08 ENCOUNTER — Encounter (HOSPITAL_COMMUNITY): Payer: Self-pay | Admitting: Cardiology

## 2015-10-08 DIAGNOSIS — N3000 Acute cystitis without hematuria: Secondary | ICD-10-CM

## 2015-10-08 DIAGNOSIS — G453 Amaurosis fugax: Secondary | ICD-10-CM

## 2015-10-08 DIAGNOSIS — R5383 Other fatigue: Secondary | ICD-10-CM | POA: Diagnosis present

## 2015-10-08 LAB — CBC
HEMATOCRIT: 31.9 % — AB (ref 36.0–46.0)
HEMOGLOBIN: 10.3 g/dL — AB (ref 12.0–15.0)
MCH: 30.2 pg (ref 26.0–34.0)
MCHC: 32.3 g/dL (ref 30.0–36.0)
MCV: 93.5 fL (ref 78.0–100.0)
Platelets: 207 10*3/uL (ref 150–400)
RBC: 3.41 MIL/uL — AB (ref 3.87–5.11)
RDW: 13.2 % (ref 11.5–15.5)
WBC: 8.6 10*3/uL (ref 4.0–10.5)

## 2015-10-08 LAB — URINE CULTURE: Culture: 60000 — AB

## 2015-10-08 LAB — TSH: TSH: 2.17 u[IU]/mL (ref 0.350–4.500)

## 2015-10-08 MED ORDER — ISOSORBIDE MONONITRATE ER 30 MG PO TB24: 15.0000 mg | ORAL_TABLET | Freq: Every day | ORAL | Status: DC

## 2015-10-08 MED ORDER — SERTRALINE HCL 50 MG PO TABS: 50.0000 mg | ORAL_TABLET | Freq: Every day | ORAL | Status: DC

## 2015-10-08 MED ORDER — AMOXICILLIN 500 MG PO CAPS
500.0000 mg | ORAL_CAPSULE | Freq: Three times a day (TID) | ORAL | Status: DC
  Administered 2015-10-08 – 2015-10-09 (×4): 500 mg via ORAL
  Filled 2015-10-08 (×4): qty 1

## 2015-10-08 MED ORDER — ALENDRONATE SODIUM 70 MG PO TABS: 70.0000 mg | ORAL_TABLET | ORAL | Status: AC

## 2015-10-08 MED ORDER — LOSARTAN POTASSIUM 25 MG PO TABS: 25.0000 mg | ORAL_TABLET | Freq: Every day | ORAL | Status: DC

## 2015-10-08 MED ORDER — AMOXICILLIN 500 MG PO CAPS: 500.0000 mg | ORAL_CAPSULE | Freq: Three times a day (TID) | ORAL | Status: DC

## 2015-10-08 MED ORDER — NITROGLYCERIN 0.4 MG SL SUBL: 0.4000 mg | SUBLINGUAL_TABLET | SUBLINGUAL | Status: DC | PRN

## 2015-10-08 MED ORDER — METOPROLOL SUCCINATE ER 25 MG PO TB24: 25.0000 mg | ORAL_TABLET | Freq: Every day | ORAL | Status: DC

## 2015-10-08 NOTE — Progress Notes (Signed)
Subjective:  No chest pain overnight but pt had an episode of visual disturbance in her Lt eye after her cath- lasted about an hour and was similar to her neurologic symptoms in March when she had a stroke. No residual effects this am. She has generalized "weakness" and a headache this am.   Objective:  Vital Signs in the last 24 hours: Temp:  [97.9 F (36.6 C)-98.6 F (37 C)] 98.3 F (36.8 C) (05/16 0557) Pulse Rate:  [62-72] 62 (05/16 0557) Resp:  [4-19] 18 (05/16 0557) BP: (103-174)/(51-82) 130/57 mmHg (05/16 0557) SpO2:  [0 %-100 %] 94 % (05/16 0557) Weight:  [166 lb 1.6 oz (75.342 kg)] 166 lb 1.6 oz (75.342 kg) (05/16 0557)  Intake/Output from previous day:  Intake/Output Summary (Last 24 hours) at 10/08/15 0905 Last data filed at 10/08/15 0849  Gross per 24 hour  Intake 2676.48 ml  Output   1100 ml  Net 1576.48 ml    Physical Exam: General appearance: alert, cooperative, no distress and mildly obese Lungs: clear to auscultation bilaterally Heart: regular rate and rhythm Skin: pale cool dry Neck-soft RCA bruit Lt RA site ecchymotic, no hematoma Neurologic: Grossly normal, no focal deficits noted 2+/4 FA pulses bilateraly   Rate: 72  Rhythm: normal sinus rhythm  Lab Results:  Recent Labs  10/07/15 0307 10/08/15 0244  WBC 10.2 8.6  HGB 11.3* 10.3*  PLT 217 207    Recent Labs  10/07/15 0307  NA 137  K 3.9  CL 105  CO2 26  GLUCOSE 98  BUN 9  CREATININE 0.54   No results for input(s): TROPONINI in the last 72 hours.  Invalid input(s): CK, MB  Recent Labs  10/07/15 0307  INR 1.07    Scheduled Meds: . aspirin EC  81 mg Oral QPM  . clopidogrel  75 mg Oral Daily  . isosorbide mononitrate  15 mg Oral Daily  . levothyroxine  75 mcg Oral QAC breakfast  . losartan  25 mg Oral Daily  . metoprolol succinate  25 mg Oral Daily  . pantoprazole  40 mg Oral Daily  . rosuvastatin  20 mg Oral Daily  . sertraline  50 mg Oral Daily  . sodium chloride  flush  3 mL Intravenous Q12H   Continuous Infusions:   PRN Meds:.sodium chloride, acetaminophen, albuterol, gi cocktail, ondansetron (ZOFRAN) IV, sodium chloride flush   Imaging: Imaging results have been reviewed   Assessment/Plan:  82 y.o. female with a past medical history significant for CAD s/p CABG in 2009, PCI to graft in 2012, hx of CVA March 2017, PVD mild CA dis, and hypothyroidism who presented 10/04/15 with [place]. She has ruled out for an MI. Cath done 10/07/15 showed OM disease-plan is for medical Rx. Procedure complicated possibly by TIA.  She is also being treated for UTI with Cipro 205 mg BID x 3 days.   Principal Problem:   Unstable angina (HCC) Active Problems:   TIA post cath 10/07/15   Hypertension   S/P CABG 2009-cath 10/07/15-medical Rx   S/P coronary artery stent placement-2012   UTI (urinary tract infection)   Fatigue   Hypothyroidism   Diastolic dysfunction-grade 1 with EF 60%   Hyperlipidemia   History of stroke-March 2017 (rt brain)   Hypertensive heart disease without heart failure   PLAN:  Will discuss with MD-not sure if head CT indicated, not sure how it would change Rx.   Give her am meds and ambulate, possible discharge later.  Check TSH secondary to generalized fatigue.  Urine culture 60K Enterococcus- complete 3 days of Cirpo.  Kerin Ransom PA-C 10/08/2015, 9:05 AM [phone]  I have seen and examined the patient along with Kerin Ransom PA-C.  I have reviewed the chart, notes and new data.  I agree with PA/NP's note.  Key new complaints: headache (likely from nitrates). The visual disturbance has returned to baseline Key examination changes: no HF Key new findings / data: cath results reviewed with patient.  PLAN: Will titrate antianginals depending on symptoms and side effects. This will be best accomplished as an out patient.  I don't think CT head will change management. She may have had a transient ischemic event related to cath or  hemodynamic changes with known occlusion of the left vertebral artery and basilar artery stenosis. Her cerebrovascular disease and BP may limit the use of conventional antianginals. If so, will try Ranexa. Feels weak and wobbly. Get PT eval before discharge. Her enterococcus is resistant to quinolones and she still has burning dysuria. Switch to amoxicillin.   Sanda Klein, MD, [place] [phone] 10/08/2015, 11:00 AM

## 2015-10-08 NOTE — Evaluation (Addendum)
Physical Therapy Evaluation Patient Details Name: Gina Bowers MRN: [id number] DOB: 27-Jul-1925 Today's Date: 10/08/2015   History of Present Illness  Gina Bowers is a 82 y.o. female with a past medical history significant for CAD s/p CABG in 2009, PCI to graft in 2012, hx of CVA and PVD, and hypothyroidism who presents with chest pain.Cardiac cath 5/15 with post-procedure visual changes x 1 hour (likely TIA as symptoms similar to her presentation when she had a CVA in April)    Clinical Impression  Patient evaluated and overall mobilizing with minguard to supervision for safety. Agree with pt/daughter's plan for pt to reside with daughter until they feel confident new medicines are not adversely effecting her BP/HR and safety with mobility. Patient has had steady decline in strength and endurance due to multiple illnesses/set-backs this year. Feel she could benefit from HHPT to assist with increasing independence and safety with mobility. Also can assist with decision to return to [place].     Follow Up Recommendations Home health PT;Supervision for mobility/OOB    Equipment Recommendations  None recommended by PT    Recommendations for Other Services       Precautions / Restrictions Precautions Precautions: Fall Precaution Comments: per daughter, pt has not fallen in past 6 mos however had several falls prior to that      Mobility  Bed Mobility Overal bed mobility: Needs Assistance Bed Mobility: Sit to Supine       Sit to supine: Min assist   General bed mobility comments: daughter assisted pt raising legs into bed (appeared pt may have been able to do without assist)  Transfers Overall transfer level: Needs assistance Equipment used: Rolling walker (2 wheeled) Transfers: Sit to/from Stand Sit to Stand: Supervision;Min guard   Daughter/pt concerned re: stepping into tub at her daughter's home; practiced stepping over a trash can turned on it's side with  pt facing wall with bil UE support and minguard assist.         General transfer comment: pt accustomed to rollator with locking brakes, yet using 2 wheel RW here  Ambulation/Gait Ambulation/Gait assistance: Min guard;Supervision Ambulation Distance (Feet): 15 Feet (coming out of rest room; 30) Assistive device: Rolling walker (2 wheeled) Gait Pattern/deviations: Step-through pattern;Decreased stride length;Trunk flexed   Gait velocity interpretation: at or above normal speed for age/gender General Gait Details: steady, tends to push RW a bit far ahead  Stairs Stairs:  (deferred; pt climbed dtr's stairs Thurs without difficulty)          Wheelchair Mobility    Modified Rankin (Stroke Patients Only)       Balance Overall balance assessment: Needs assistance;History of Falls (falls >6 months ago) Sitting-balance support: No upper extremity supported;Feet supported Sitting balance-Leahy Scale: Good     Standing balance support: No upper extremity supported Standing balance-Leahy Scale: Fair Standing balance comment: washing hands at sink; reaching for towels                             Pertinent Vitals/Pain BP sitting after walk 116/58. Describes no change in symptoms; has chronically been feeling fatigued.  Pain Assessment: No/denies pain    Home Living Family/patient expects to be discharged to:: Private residence (Independent living) Living Arrangements: Alone Available Help at Discharge: Family;Personal care attendant;Available 24 hours/day (to go to daughter's on d/c ) Type of Home: House (daughter's home info) Home Access: Stairs to enter (daughter's home) Entrance Stairs-Rails: Right  Entrance Stairs-Number of Steps: 3 Home Layout: One level Home Equipment: Clinical cytogeneticist - 4 wheels;Bedside commode (BSC over toilet) Additional Comments: usual aide is out after having surgery    Prior Function Level of Independence: Needs assistance   Gait  / Transfers Assistance Needed: uses rollator; walks to dining room; dtr reports steady decline this year as back-to-back medical issues  ADL's / Homemaking Assistance Needed: caregiver assists with dressing and bathing. All meals and cleaning provided by ALF        Hand Dominance   Dominant Hand: Right    Extremity/Trunk Assessment   Upper Extremity Assessment: Generalized weakness           Lower Extremity Assessment: Generalized weakness (knee extension 4/5 RLE, 3+/5 LLE; coordination intact)      Cervical / Trunk Assessment: Kyphotic  Communication   Communication: No difficulties  Cognition Arousal/Alertness: Awake/alert Behavior During Therapy: WFL for tasks assessed/performed Overall Cognitive Status: Within Functional Limits for tasks assessed                      General Comments General comments (skin integrity, edema, etc.): Lengthy discussion with pt and dtr re: d/c plan (going to stay with dtr due to concern changes in cardiac meds may make her weak/unsteady)    Exercises        Assessment/Plan    PT Assessment All further PT needs can be met in the next venue of care  PT Diagnosis Generalized weakness   PT Problem List Decreased strength;Decreased activity tolerance;Decreased balance;Decreased mobility;Obesity  PT Treatment Interventions     PT Goals (Current goals can be found in the Care Plan section) Acute Rehab PT Goals Patient Stated Goal: ultimately return to Independent living PT Goal Formulation: All assessment and education complete, DC therapy    Frequency     Barriers to discharge        Co-evaluation               End of Session Equipment Utilized During Treatment: Gait belt Activity Tolerance: Patient tolerated treatment well Patient left: in bed;with call bell/phone within reach;with family/visitor present Nurse Communication: Mobility status (recommend HHPT; no DME needs)         Time: 1330-1411 PT Time  Calculation (min) (ACUTE ONLY): 41 min   Charges:   PT Evaluation $PT Eval Moderate Complexity: 1 Procedure PT Treatments $Gait Training: 8-22 mins $Self Care/Home Management: 8-22   PT G Codes:        Fallynn Gravett 05-Nov-2015, 2:29 PM Pager [phone]

## 2015-10-09 ENCOUNTER — Encounter (HOSPITAL_COMMUNITY): Payer: Self-pay | Admitting: Nurse Practitioner

## 2015-10-09 DIAGNOSIS — G450 Vertebro-basilar artery syndrome: Secondary | ICD-10-CM

## 2015-10-09 LAB — CBC
HCT: 33 % — ABNORMAL LOW (ref 36.0–46.0)
HEMOGLOBIN: 10.8 g/dL — AB (ref 12.0–15.0)
MCH: 30.6 pg (ref 26.0–34.0)
MCHC: 32.7 g/dL (ref 30.0–36.0)
MCV: 93.5 fL (ref 78.0–100.0)
Platelets: 218 10*3/uL (ref 150–400)
RBC: 3.53 MIL/uL — AB (ref 3.87–5.11)
RDW: 13.2 % (ref 11.5–15.5)
WBC: 9.4 10*3/uL (ref 4.0–10.5)

## 2015-10-09 MED ORDER — SOLIFENACIN SUCCINATE 5 MG PO TABS: 5.0000 mg | ORAL_TABLET | Freq: Every day | ORAL | Status: DC

## 2015-10-09 MED ORDER — AMOXICILLIN 500 MG PO CAPS: 500.0000 mg | ORAL_CAPSULE | Freq: Three times a day (TID) | ORAL | Status: DC

## 2015-10-09 NOTE — Progress Notes (Deleted)
Pt insisting to go to his car in the parking lotto have it fix.  Explained to pt that since he is  admitted in the hospital at this time will not be a good idea to do so.  Asked to speak with MD.  Dr.  Merrilyn Puma notified and came up to talk with pt.  Will continue to monitor.

## 2015-10-09 NOTE — Discharge Summary (Signed)
Discharge Summary    Patient ID: Gina Bowers,  MRN: [id number], DOB/AGE: 80-Feb-1927 80 y.o.  Admit date: 10/04/2015 Discharge date: 10/09/2015  Primary Care Provider: Irven Shelling Primary Cardiologist: Jerilynn Mages. Croitoru, MD   Discharge Diagnoses    Principal Problem:   Unstable angina (HCC)  **status post diagnostic catheterization this admission revealing 2/4 patent grafts with patent RCA stent  Medical therapy.  Active Problems:   Hypertensive heart disease without heart failure   S/P CABG 2009-cath 10/07/15-medical Rx   S/P coronary artery stent placement-2012   TIA post cath 10/07/15  **1 hour episode of left eye visual disturbance following catheterization.   Diastolic dysfunction-grade 1 with EF 60%   Hypothyroidism   Hyperlipidemia   History of stroke-March 2017 (rt brain)   UTI (urinary tract infection)  **Enterococcus species - resistant to fluoroquinolones  changed to amoxicillin.   Fatigue   Allergies Allergies  Allergen Reactions  . Sulfonamide Derivatives Hives    Diagnostic Studies/Procedures    Cardiac Catheterization 5.15.2017  Coronary Findings     Dominance: Right    Left Anterior Descending   . Mid LAD lesion, 100% stenosed.   . First Diagonal Branch   . 1st Diag lesion, 100% stenosed.      Left Circumflex   . First Obtuse Marginal Branch   The vessel is small in size. The first OM is approximately 2.25 mm in diameter and is diffusely diseased over a 40 mm segment.   . 1st Mrg lesion, 90% stenosed. Diffuse.   . Second Obtuse Marginal Branch   The vessel is small in size. The second OM is 2 mm in diameter with focal 70% disease.   . 2nd Mrg lesion, 70% stenosed. Discrete.   . Third Obtuse Marginal Branch   The vessel is small in size.      Right Coronary Artery   . Prox RCA lesion, 45% stenosed. Discrete.   . Prox RCA to Mid RCA lesion, 0% stenosed. Previously placed Prox RCA to Mid RCA bare metal stent is patent.      Graft  Angiography     Free Graft to Dist RCA  SVG was injected .   Marland Kitchen Origin lesion, 100% stenosed.      Free Graft to 1st Mrg  SVG was injected .   Marland Kitchen Origin lesion, 100% stenosed.      Free Graft to 1st Diag  SVG was injected is normal in caliber, and is anatomically normal.      Free LIMA Graft to Dist LAD  LIMA was injected is normal in caliber, and is anatomically normal.         _____________   History of Present Illness     82 y/o ? ? with a h/o CAD s/p four vessel bypass in 2009, CVD, HTN, HL, Carotid dzs, and hypothyroidism.  She was in her usual state of health until approximately 2 days prior to admission, when she began to experience midscapular back pain radiating to her jaw and associated with diaphoresis, and fatigue.  This persisted into the following day and eventually prompted her to present to the ED for further eval. There, she was hypertensive (190/85) and ECG was non-acute.  Troponin was normal.  CTA of the chest, abdomen, and pelvis was performed and was negative for dissection or PE.  She was placed on heparin and nitrates and admitted for further evaluatino.  Hospital Course     Consultants: None   Gina Bowers ruled out  for MI.  Cardiology was consulted on 5/12 and subsequently assumed care.  Symptoms were felt to be concerning for angina and thus she was scheduled for diagnostic catheterization.  She had no further pain over the weekend and underwent diagnostic cath on Monday 5/15, revealing 2/4 patent grafts with a patent RCA stent.  She has severe disease in the native OM1 with an occluded graft formerly to that area.  Medical therapy was recommended and she was placed on  blocker, ARB, and low dose nitrate therapy.  Following her catheterization, she noted a left eye visual disturbance that lasted for approximately 1 hr.  She did not report this until the following morning.  As symptoms had resolved and she was already being treated with asa/plavix, no further imaging was  ordered.    Gina Bowers was seen by physical therapy on 5/16 and recommendation was made for HHPT. She has had no further midscapular pain and has been ambulating without difficulty.  Her BP is under much better control with the addition of  blocker, ARB, and nitrate therapy.  We have also discontinued her previous home dose of Myrbetriq, as it is known to contribute to hypertension.  In it's place, we have sent in a Rx for Vesicare.    Of note, she was diagnosed with UTI on admission and was initially placed on cipro.  Culture grew out Enterococcus, which was resistant to fluoroquinolones, and thus she was switched to amoxicillin on 5/16.  She will be need to complete a 5 day course of therapy. She will be discharged home today in good condition.   _____________  Discharge Vitals Blood pressure 125/65, pulse 57, temperature 98.2 F (36.8 C), temperature source Oral, resp. rate 18, height 5\' 2"  (1.575 m), weight 168 lb (76.204 kg), SpO2 98 %.  Filed Weights   10/07/15 0608 10/08/15 0557 10/09/15 0555  Weight: 167 lb 9.6 oz (76.023 kg) 166 lb 1.6 oz (75.342 kg) 168 lb (76.204 kg)    Labs & Radiologic Studies    CBC  Recent Labs  10/08/15 0244 10/09/15 0420  WBC 8.6 9.4  HGB 10.3* 10.8*  HCT 31.9* 33.0*  MCV 93.5 93.5  PLT 207 99991111   Basic Metabolic Panel  Recent Labs  10/07/15 0307  NA 137  K 3.9  CL 105  CO2 26  GLUCOSE 98  BUN 9  CREATININE 0.54  CALCIUM 8.3*   Liver Function Tests Lab Results  Component Value Date   ALT 22 10/04/2015   AST 21 10/04/2015   ALKPHOS 52 10/04/2015   BILITOT 0.3 10/04/2015    Cardiac Enzymes Lab Results  Component Value Date   TROPONINI <0.03 10/04/2015    Thyroid Function Tests  Recent Labs  10/08/15 0908  TSH 2.170  _____________   Dg Chest 2 View  10/04/2015  CLINICAL DATA:  82 year old female with chest pain and shortness of breath EXAM: CHEST  2 VIEW COMPARISON:  Chest radiograph dated 09/17/2015 FINDINGS: Two views of  the chest do not demonstrate a focal consolidation. There is no pleural effusion or pneumothorax. Stable vertebroplasty changes of the lower thoracic spine. No acute fracture. IMPRESSION: No active cardiopulmonary disease. Electronically Signed   By: Anner Crete M.D.   On: 10/04/2015 03:05   Ct Angio Chest/abd/pel For Dissection W And/or W/wo  10/04/2015  CLINICAL DATA:  Intermittent left shoulder and upper back pain since yesterday. Generalized weakness and fatigue. EXAM: CT ANGIOGRAPHY CHEST, ABDOMEN AND PELVIS  IMPRESSION: 1. Normal caliber thoracic  and abdominal aorta. No dissection or aneurysm. Moderate plaque at the origin of many of the major branch vessels, but no critical stenoses. 2. No acute findings are evident in the chest, abdomen or pelvis. 3. Large hiatal hernia. Electronically Signed   By: Andreas Newport M.D.   On: 10/04/2015 05:33   Disposition   Pt is being discharged home today in good condition.  Follow-up Plans & Appointments    Follow-up Information    Follow up with Sanda Klein, MD On 10/28/2015.   Specialty:  Cardiology   Why:  10:45   Contact information:   [address] [place] [address] [phone]        Discharge Medications   Current Discharge Medication List    START taking these medications   Details  amoxicillin (AMOXIL) 500 MG capsule Take 1 capsule (500 mg total) by mouth 3 (three) times daily. Qty: 6 capsule, Refills: 0    isosorbide mononitrate (IMDUR) 30 MG 24 hr tablet Take 0.5 tablets (15 mg total) by mouth daily. Qty: 30 tablet, Refills: 5    losartan (COZAAR) 25 MG tablet Take 1 tablet (25 mg total) by mouth daily. Qty: 30 tablet, Refills: 5    metoprolol succinate (TOPROL-XL) 25 MG 24 hr tablet Take 1 tablet (25 mg total) by mouth daily. Qty: 30 tablet, Refills: 5    nitroGLYCERIN (NITROSTAT) 0.4 MG SL tablet Place 1 tablet (0.4 mg total) under the tongue every 5 (five) minutes as needed for chest  pain. Qty: 25 tablet, Refills: 2    solifenacin (VESICARE) 5 MG tablet Take 1 tablet (5 mg total) by mouth daily. Qty: 30 tablet, Refills: 3      CONTINUE these medications which have CHANGED   Details  alendronate (FOSAMAX) 70 MG tablet Take 1 tablet (70 mg total) by mouth once a week. Take with a full glass of water on an empty stomach.mon Qty: 15 tablet, Refills: 0    sertraline (ZOLOFT) 50 MG tablet Take 1 tablet (50 mg total) by mouth daily. Qty: 135 tablet, Refills: 0      CONTINUE these medications which have NOT CHANGED   Details  acetaminophen (TYLENOL) 500 MG tablet Take 1,000 mg by mouth every 6 (six) hours as needed for mild pain.    Albuterol Sulfate (PROAIR RESPICLICK) 123XX123 (90 Base) MCG/ACT AEPB Inhale 2 puffs into the lungs daily as needed (shortness of breath).    aspirin EC 81 MG tablet Take 81 mg by mouth every evening.     Calcium Carb-Cholecalciferol (CALCIUM 1000 + D PO) Take 1 tablet by mouth daily.     clopidogrel (PLAVIX) 75 MG tablet Take 1 tablet (75 mg total) by mouth daily. Qty: 90 tablet, Refills: 3    CRANBERRY EXTRACT PO Take 500 mg by mouth daily.    fish oil-omega-3 fatty acids 1000 MG capsule Take 1 g by mouth daily with breakfast.    levothyroxine (SYNTHROID, LEVOTHROID) 75 MCG tablet Take 1 tablet (75 mcg total) by mouth daily before breakfast. Qty: 90 tablet, Refills: 0    meclizine (ANTIVERT) 25 MG tablet Take 25 mg by mouth 3 (three) times daily as needed for dizziness.    Multiple Vitamin (MULITIVITAMIN WITH MINERALS) TABS Take 1 tablet by mouth daily with breakfast.    ondansetron (ZOFRAN ODT) 4 MG disintegrating tablet Take one every 6 hours for nausea Qty: 12 tablet, Refills: 0    pantoprazole (PROTONIX) 40 MG tablet Take 1 tablet (40 mg total)  by mouth daily. Qty: 90 tablet, Refills: 3    Polyethyl Glycol-Propyl Glycol (SYSTANE OP) Place 1 drop into both eyes 3 (three) times daily. Patient states its the Gel form. Not sure what  percentage. Take every day per patient    Probiotic Product (PROBIOTIC PO) Take 1 tablet by mouth daily.    rosuvastatin (CRESTOR) 20 MG tablet Take 1 tablet (20 mg total) by mouth daily. Qty: 90 tablet, Refills: 0      STOP taking these medications     mirabegron ER (MYRBETRIQ) 50 MG TB24 tablet      OVER THE COUNTER MEDICATION          Outstanding Labs/Studies   None  Duration of Discharge Encounter   Greater than 30 minutes including physician time.  Signed, Murray Hodgkins NP 10/09/2015, 8:33 AM

## 2015-10-09 NOTE — Progress Notes (Signed)
Patient will be staying with her daughter Gina Bowers at discharge Address: [address] [place], [place] [address] Cell phone # [phone]  Orders faxed to  [facility] at [facility] as requested; Aneta Mins [phone]

## 2015-10-09 NOTE — Progress Notes (Signed)
All d/c instructions explained and give to pt and daughter verbalized understanding prior to d/c at 1504 to home via transport.  Karie Kirks, Therapist, sports.

## 2015-10-09 NOTE — Progress Notes (Signed)
Patient Name: Gina Bowers Date of Encounter: 10/09/2015  Hospital Problem List     Principal Problem:   Unstable angina [facility]) Active Problems:   Hypertensive heart disease without heart failure   S/P CABG 2009-cath 10/07/15-medical Rx   S/P coronary artery stent placement-2012   TIA post cath 0000000   Diastolic dysfunction-grade 1 with EF 60%   Hypothyroidism   Hyperlipidemia   History of stroke-March 2017 (rt brain)   UTI (urinary tract infection)   Fatigue    Subjective   No chest pain or sob.  Feels stronger.  In good spirits.  Looking forward to going home.  Inpatient Medications    . amoxicillin  500 mg Oral Q8H  . aspirin EC  81 mg Oral QPM  . clopidogrel  75 mg Oral Daily  . isosorbide mononitrate  15 mg Oral Daily  . levothyroxine  75 mcg Oral QAC breakfast  . losartan  25 mg Oral Daily  . metoprolol succinate  25 mg Oral Daily  . pantoprazole  40 mg Oral Daily  . rosuvastatin  20 mg Oral Daily  . sertraline  50 mg Oral Daily  . sodium chloride flush  3 mL Intravenous Q12H    Vital Signs    Filed Vitals:   10/08/15 0557 10/08/15 1205 10/08/15 1945 10/09/15 0555  BP: 130/57 108/43 98/49 125/65  Pulse: 62 58 64 57  Temp: 98.3 F (36.8 C) 98.8 F (37.1 C) 98.2 F (36.8 C) 98.2 F (36.8 C)  TempSrc: Oral Oral Oral Oral  Resp: 18 18 18 18   Height:      Weight: 166 lb 1.6 oz (75.342 kg)   168 lb (76.204 kg)  SpO2: 94% 95% 98% 98%    Intake/Output Summary (Last 24 hours) at 10/09/15 0750 Last data filed at 10/09/15 0500  Gross per 24 hour  Intake   1240 ml  Output   1400 ml  Net   -160 ml   Filed Weights   10/07/15 0608 10/08/15 0557 10/09/15 0555  Weight: 167 lb 9.6 oz (76.023 kg) 166 lb 1.6 oz (75.342 kg) 168 lb (76.204 kg)    Physical Exam    General: Pleasant, NAD. Neuro: Alert and oriented X 3. Moves all extremities spontaneously. Psych: Normal affect. HEENT:  Normal  Neck: Supple without bruits or JVD. Lungs:  Resp regular and  unlabored, initially scattered rhonchi - cleared with deep breathing. Heart: RRR no s3, s4, or murmurs. Abdomen: Soft, non-tender, non-distended, BS + x 4.  Extremities: No clubbing, cyanosis or edema. DP/PT/Radials 2+ and equal bilaterally. L wrist cath site mildly ecchymotic w/o bleeding, bruit, hematoma.  Labs    CBC  Recent Labs  10/08/15 0244 10/09/15 0420  WBC 8.6 9.4  HGB 10.3* 10.8*  HCT 31.9* 33.0*  MCV 93.5 93.5  PLT 207 [id number]   Basic Metabolic Panel  Recent Labs  10/07/15 0307  NA 137  K 3.9  CL 105  CO2 26  GLUCOSE 98  BUN 9  CREATININE 0.54  CALCIUM 8.3*   Thyroid Function Tests  Recent Labs  10/08/15 0908  TSH 2.170   Lab Results  Component Value Date   LDLCALC 75 08/17/2015     Telemetry    RSR, 60's  Radiology    Dg Chest 2 View  10/04/2015  CLINICAL DATA:  82 year old female with chest pain and shortness of breath EXAM: CHEST  2 VIEW COMPARISON:  Chest radiograph dated 09/17/2015 FINDINGS: Two views of the chest  do not demonstrate a focal consolidation. There is no pleural effusion or pneumothorax. Stable vertebroplasty changes of the lower thoracic spine. No acute fracture. IMPRESSION: No active cardiopulmonary disease. Electronically Signed   By: Anner Crete M.D.   On: 10/04/2015 03:05   Dg Chest 2 View  09/17/2015  CLINICAL DATA:  Bronchitis.  Persistent cough, shortness of breath. EXAM: CHEST  2 VIEW COMPARISON:  08/28/2015 FINDINGS: Prior CABG. Heart is normal size. No confluent airspace opacities or effusions. Prior vertebroplasty in the lower thoracic spine. No acute bony abnormality. IMPRESSION: No active cardiopulmonary disease. Electronically Signed   By: Rolm Baptise M.D.   On: 09/17/2015 11:05   Ct Angio Chest Aorta W/cm &/or Wo/cm  10/04/2015  CLINICAL DATA:  Intermittent left shoulder and upper back pain since yesterday. Generalized weakness and fatigue. EXAM: CT ANGIOGRAPHY CHEST, ABDOMEN AND PELVIS TECHNIQUE: Multidetector  CT imaging through the chest, abdomen and pelvis was performed using the standard protocol during bolus administration of intravenous contrast. Multiplanar reconstructed images and MIPs were obtained and reviewed to evaluate the vascular anatomy. CONTRAST:  100 mL Isovue 370 intravenous COMPARISON:  None. FINDINGS: CTA CHEST FINDINGS The thoracic aorta is normal in caliber. There is no dissection or aneurysm. Moderate non stenotic plaque is present at the origin of the left subclavian artery. The other great vessel origins are unremarkable. Proximal great vessels are patent. The pulmonary vasculature is also well opacified and is normal. Review of the MIP images confirms the above findings. Nonvascular findings in the chest: The lungs are clear. Airways are patent and normal in caliber. There is no hilar or mediastinal adenopathy. There are no effusions. There is a large hiatal hernia. CTA ABDOMEN AND PELVIS FINDINGS The abdominal aorta is normal in caliber with moderate atherosclerotic calcification. There is no dissection. The origins of the celiac and superior mesenteric artery are heavily calcified but only mildly stenotic with less than 50% diameter reduction. There are 2 right renal arteries, both patent although the main right renal artery does have bulky plaque at its origin. There is a single left renal artery, calcified at its origin but patent. Both common iliac arteries are widely patent and normal in caliber. Iliac bifurcations are patent bilaterally. External iliac arteries are patent bilaterally. Review of the MIP images confirms the above findings. Nonvascular findings in the abdomen and pelvis: There are normal appearances of the liver, gallbladder, bile ducts, pancreas, spleen and adrenals. There is a 4.2 cm benign cyst at the upper pole of the left kidney. There is a cortical scar at the upper pole of the right kidney. No suspicious parenchymal renal lesions are evident. The collecting systems and  ureters are unremarkable bilaterally. Urinary bladder is unremarkable bilaterally. The stomach is remarkable only for the large hiatal hernia. Small bowel and colon are unremarkable. There is mild metal artifact from left hip prosthesis. No significant skeletal lesions are evident. Prior vertebral augmentation at T10. IMPRESSION: 1. Normal caliber thoracic and abdominal aorta. No dissection or aneurysm. Moderate plaque at the origin of many of the major branch vessels, but no critical stenoses. 2. No acute findings are evident in the chest, abdomen or pelvis. 3. Large hiatal hernia. Electronically Signed   By: Andreas Newport M.D.   On: 10/04/2015 05:33   Ct Angio Chest/abd/pel For Dissection W And/or W/wo  10/04/2015  CLINICAL DATA:  Intermittent left shoulder and upper back pain since yesterday. Generalized weakness and fatigue. EXAM: CT ANGIOGRAPHY CHEST, ABDOMEN AND PELVIS TECHNIQUE: Multidetector CT  imaging through the chest, abdomen and pelvis was performed using the standard protocol during bolus administration of intravenous contrast. Multiplanar reconstructed images and MIPs were obtained and reviewed to evaluate the vascular anatomy. CONTRAST:  100 mL Isovue 370 intravenous COMPARISON:  None. FINDINGS: CTA CHEST FINDINGS The thoracic aorta is normal in caliber. There is no dissection or aneurysm. Moderate non stenotic plaque is present at the origin of the left subclavian artery. The other great vessel origins are unremarkable. Proximal great vessels are patent. The pulmonary vasculature is also well opacified and is normal. Review of the MIP images confirms the above findings. Nonvascular findings in the chest: The lungs are clear. Airways are patent and normal in caliber. There is no hilar or mediastinal adenopathy. There are no effusions. There is a large hiatal hernia. CTA ABDOMEN AND PELVIS FINDINGS The abdominal aorta is normal in caliber with moderate atherosclerotic calcification. There is no  dissection. The origins of the celiac and superior mesenteric artery are heavily calcified but only mildly stenotic with less than 50% diameter reduction. There are 2 right renal arteries, both patent although the main right renal artery does have bulky plaque at its origin. There is a single left renal artery, calcified at its origin but patent. Both common iliac arteries are widely patent and normal in caliber. Iliac bifurcations are patent bilaterally. External iliac arteries are patent bilaterally. Review of the MIP images confirms the above findings. Nonvascular findings in the abdomen and pelvis: There are normal appearances of the liver, gallbladder, bile ducts, pancreas, spleen and adrenals. There is a 4.2 cm benign cyst at the upper pole of the left kidney. There is a cortical scar at the upper pole of the right kidney. No suspicious parenchymal renal lesions are evident. The collecting systems and ureters are unremarkable bilaterally. Urinary bladder is unremarkable bilaterally. The stomach is remarkable only for the large hiatal hernia. Small bowel and colon are unremarkable. There is mild metal artifact from left hip prosthesis. No significant skeletal lesions are evident. Prior vertebral augmentation at T10. IMPRESSION: 1. Normal caliber thoracic and abdominal aorta. No dissection or aneurysm. Moderate plaque at the origin of many of the major branch vessels, but no critical stenoses. 2. No acute findings are evident in the chest, abdomen or pelvis. 3. Large hiatal hernia. Electronically Signed   By: Andreas Newport M.D.   On: 10/04/2015 05:33    Assessment & Plan    1.  Unstable Angina/CAD: S/p cath on 5/15 revealing native multivessel dzs with patent LIMA  LAD, VG  Diag, occluded VG  OM & VG  RCA.  Patent native RCA stent.  Severe OM2 dzs.  Med Rx recommended and she ha been placed on isosorbide and  blocker.  Walked some yesterday w/o chest pain.  Feeling stronger today.  Eager to go home.   Cont asa, statin,  blocker, low dose imdur.  Plan f/u within the next 2 wks.  HHPT @ d/c 2/2 deconditioning.  2.  Hypertensive Heart Disease:  BP had been trending high following admission.  Much better over past 24 hrs on  blocker, ARB, nitrate.  Continue current doses.  Dtr to check bp daily @ home.    3.  HL:  LDL 75 in March.  Cont statin therapy.  4.  TIA post-cath:  No recurrent symptoms.  Cont asa, plavix, statin.  5.  Enterococcus UTI: Resistant to quinolone.  Placed on amoxicillin yesterday.  Plan to treat for 5 days.  6.  Urinary  incontinence:  Previously on Myrbetriq  held on admission 2/2 elevated BPs.  Plan to initiate vesicare 5 mg daily @ d/c.  QTc nl.  She has f/u with urology in July.  Signed, Murray Hodgkins NP  I have seen and examined the patient along with Murray Hodgkins NP.  I have reviewed the chart, notes and new data.  I agree with NP's note.  PLAN: Biggest challenge will be adjusting meds to provide optimal angina relief, without dropping BP so low that she has neuro symptoms related to severe posterior cerebral circulation disease. The problem is compounded by noncompliant arterial system with wide pulse pressure/low diastolic pressure. Will require frequent f/u while we titrate her antianginals.  Sanda Klein, MD, [place] [phone] 10/09/2015, 11:24 AM

## 2015-10-09 NOTE — Care Management Note (Signed)
Case Management Note  Patient Details  Name: Gina Bowers MRN: [id number] Date of Birth: [birth year]  Subjective/Objective:    Admitted with Unstable Angina                Action/Plan:  CM talked to pt with daughter at bedside, Patient will be staying with her daughter [name] at discharge ( [facility] resides at an [facility]). Patient was active with [facility] care and request to have them again. She has a walker and cane at home. She also have a caregiver but she is out at this time with an injury. Attending MD at discharge please enter the face to face document in Epic for [facility] services / [facility], PT and nurses aide.   Expected Discharge Date:  10/05/15               Expected Discharge Plan:  [place]  In-House Referral:  Clinical Social Work  Discharge planning Services  CM Consult  HH Arranged:  RN, PT, Nurse's Aide [place] Agency:  [place]  Status of Service:  In process, will continue to follow  Sherrilyn Rist [phone] 10/09/2015, 10:03 AM

## 2015-10-09 NOTE — Discharge Instructions (Signed)
**PLEASE REMEMBER TO BRING ALL OF YOUR MEDICATIONS TO EACH OF YOUR FOLLOW-UP OFFICE VISITS.  Radial Site Care Refer to this sheet in the next few weeks. These instructions provide you with information on caring for yourself after your procedure. Your caregiver may also give you more specific instructions. Your treatment has been planned according to current medical practices, but problems sometimes occur. Call your caregiver if you have any problems or questions after your procedure. HOME CARE INSTRUCTIONS  You may shower the day after the procedure.Remove the bandage (dressing) and gently wash the site with plain soap and water.Gently pat the site dry.   Do not apply powder or lotion to the site.   Do not submerge the affected site in water for 3 to 5 days.   Inspect the site at least twice daily.   Do not flex or bend the affected arm for 24 hours.   No lifting over 5 pounds (2.3 kg) for 5 days after your procedure.   Do not drive home if you are discharged the same day of the procedure. Have someone else drive you.   What to expect:  Any bruising will usually fade within 1 to 2 weeks.   Blood that collects in the tissue (hematoma) may be painful to the touch. It should usually decrease in size and tenderness within 1 to 2 weeks.  SEEK IMMEDIATE MEDICAL CARE IF:  You have unusual pain at the radial site.   You have redness, warmth, swelling, or pain at the radial site.   You have drainage (other than a small amount of blood on the dressing).   You have chills.   You have a fever or persistent symptoms for more than 72 hours.   You have a fever and your symptoms suddenly get worse.   Your arm becomes pale, cool, tingly, or numb.   You have heavy bleeding from the site. Hold pressure on the site.    Angina Pectoris Angina pectoris is a very bad feeling in the chest, neck, or arm. Your doctor may call it angina. There are four types of angina. Angina is caused by a lack  of blood in the middle and thickest layer of the heart wall (myocardium). Angina may feel like a crushing or squeezing pain in the chest. It may feel like tightness or heavy pressure in the chest. Some people say it feels like gas, heartburn, or indigestion. Some people have symptoms other than pain. These include:  Shortness of breath.  Cold sweats.  Feeling sick to your stomach (nausea).  Feeling light-headed. Many women have chest discomfort and some of the other symptoms. However, women often have different symptoms, such as:  Feeling tired (fatigue).  Feeling nervous for no reason.  Feeling weak for no reason.  Dizziness or fainting. Women may have angina without any symptoms. HOME CARE  Take medicines only as told by your doctor.  Take care of other health issues as told by your doctor. These include: 1. High blood pressure (hypertension). 2. Diabetes.  Follow a heart-healthy diet. Your doctor can help you to choose healthy food options and make changes.  Talk to your doctor to learn more about healthy cooking methods and use them. These include: 1. Roasting. 2. Grilling. 3. Broiling. 4. Baking. 5. Poaching. 6. Steaming. 7. Stir-frying.  Follow an exercise program approved by your doctor.  Keep a healthy weight. Lose weight as told by your doctor.  Rest when you are tired.  Learn to manage stress.  Do not use any tobacco, such as cigarettes, chewing tobacco, or electronic cigarettes. If you need help quitting, ask your doctor.  If you drink alcohol, and your doctor says it is okay, limit yourself to no more than 1 drink per day. One drink equals 12 ounces of beer, 5 ounces of wine, or 1 ounces of hard liquor.  Stop illegal drug use.  Keep all follow-up visits as told by your doctor. This is important. Do not take these medicines unless your doctor says that you can:  Nonsteroidal anti-inflammatory drugs (NSAIDs). These  include:  Ibuprofen.  Naproxen.  Celecoxib.  Vitamin supplements that have vitamin A, vitamin E, or both.  Hormone therapy that contains estrogen with or without progestin. GET HELP RIGHT AWAY IF:  You have pain in your chest, neck, arm, jaw, stomach, or back that:  Lasts more than a few minutes.  Comes back.  Does not get better after you take medicine under your tongue (sublingual nitroglycerin).  You have any of these symptoms for no reason:  Gas, heartburn, or indigestion.  Sweating a lot.  Shortness of breath or trouble breathing.  Feeling sick to your stomach or throwing up.  Feeling more tired than usual.  Feeling nervous or worrying more than usual.  Feeling weak.  Diarrhea.  You are suddenly dizzy or light-headed.  You faint or pass out. These symptoms may be an emergency. Do not wait to see if the symptoms will go away. Get medical help right away. Call your local emergency services (911 in the [place] Do not drive yourself to the hospital.   This information is not intended to replace advice given to you by your health care provider. Make sure you discuss any questions you have with your health care provider.   Document Released: 10/28/2007 Document Revised: 09/25/2014 Document Reviewed: 09/12/2013 Elsevier Interactive Patient Education [employer].

## 2015-10-11 DIAGNOSIS — N390 Urinary tract infection, site not specified: Secondary | ICD-10-CM | POA: Diagnosis not present

## 2015-10-11 DIAGNOSIS — E039 Hypothyroidism, unspecified: Secondary | ICD-10-CM | POA: Diagnosis not present

## 2015-10-11 DIAGNOSIS — Z87440 Personal history of urinary (tract) infections: Secondary | ICD-10-CM | POA: Diagnosis not present

## 2015-10-11 DIAGNOSIS — Z9181 History of falling: Secondary | ICD-10-CM | POA: Diagnosis not present

## 2015-10-11 DIAGNOSIS — F419 Anxiety disorder, unspecified: Secondary | ICD-10-CM | POA: Diagnosis not present

## 2015-10-11 DIAGNOSIS — I69354 Hemiplegia and hemiparesis following cerebral infarction affecting left non-dominant side: Secondary | ICD-10-CM | POA: Diagnosis not present

## 2015-10-11 DIAGNOSIS — I2510 Atherosclerotic heart disease of native coronary artery without angina pectoris: Secondary | ICD-10-CM | POA: Diagnosis not present

## 2015-10-11 DIAGNOSIS — Z7901 Long term (current) use of anticoagulants: Secondary | ICD-10-CM | POA: Diagnosis not present

## 2015-10-11 DIAGNOSIS — Z96642 Presence of left artificial hip joint: Secondary | ICD-10-CM | POA: Diagnosis not present

## 2015-10-11 DIAGNOSIS — E785 Hyperlipidemia, unspecified: Secondary | ICD-10-CM | POA: Diagnosis not present

## 2015-10-11 DIAGNOSIS — M21372 Foot drop, left foot: Secondary | ICD-10-CM | POA: Diagnosis not present

## 2015-10-11 DIAGNOSIS — I5030 Unspecified diastolic (congestive) heart failure: Secondary | ICD-10-CM | POA: Diagnosis not present

## 2015-10-11 DIAGNOSIS — J984 Other disorders of lung: Secondary | ICD-10-CM | POA: Diagnosis not present

## 2015-10-11 DIAGNOSIS — Z951 Presence of aortocoronary bypass graft: Secondary | ICD-10-CM | POA: Diagnosis not present

## 2015-10-11 DIAGNOSIS — F329 Major depressive disorder, single episode, unspecified: Secondary | ICD-10-CM | POA: Diagnosis not present

## 2015-10-11 DIAGNOSIS — I110 Hypertensive heart disease with heart failure: Secondary | ICD-10-CM | POA: Diagnosis not present

## 2015-10-11 DIAGNOSIS — I651 Occlusion and stenosis of basilar artery: Secondary | ICD-10-CM | POA: Diagnosis not present

## 2015-10-11 DIAGNOSIS — G4733 Obstructive sleep apnea (adult) (pediatric): Secondary | ICD-10-CM | POA: Diagnosis not present

## 2015-10-14 DIAGNOSIS — I2510 Atherosclerotic heart disease of native coronary artery without angina pectoris: Secondary | ICD-10-CM | POA: Diagnosis not present

## 2015-10-14 DIAGNOSIS — I5030 Unspecified diastolic (congestive) heart failure: Secondary | ICD-10-CM | POA: Diagnosis not present

## 2015-10-14 DIAGNOSIS — I69354 Hemiplegia and hemiparesis following cerebral infarction affecting left non-dominant side: Secondary | ICD-10-CM | POA: Diagnosis not present

## 2015-10-14 DIAGNOSIS — I651 Occlusion and stenosis of basilar artery: Secondary | ICD-10-CM | POA: Diagnosis not present

## 2015-10-14 DIAGNOSIS — I110 Hypertensive heart disease with heart failure: Secondary | ICD-10-CM | POA: Diagnosis not present

## 2015-10-14 DIAGNOSIS — N390 Urinary tract infection, site not specified: Secondary | ICD-10-CM | POA: Diagnosis not present

## 2015-10-15 DIAGNOSIS — I110 Hypertensive heart disease with heart failure: Secondary | ICD-10-CM | POA: Diagnosis not present

## 2015-10-15 DIAGNOSIS — I69354 Hemiplegia and hemiparesis following cerebral infarction affecting left non-dominant side: Secondary | ICD-10-CM | POA: Diagnosis not present

## 2015-10-15 DIAGNOSIS — N390 Urinary tract infection, site not specified: Secondary | ICD-10-CM | POA: Diagnosis not present

## 2015-10-15 DIAGNOSIS — I2510 Atherosclerotic heart disease of native coronary artery without angina pectoris: Secondary | ICD-10-CM | POA: Diagnosis not present

## 2015-10-15 DIAGNOSIS — I5030 Unspecified diastolic (congestive) heart failure: Secondary | ICD-10-CM | POA: Diagnosis not present

## 2015-10-15 DIAGNOSIS — I651 Occlusion and stenosis of basilar artery: Secondary | ICD-10-CM | POA: Diagnosis not present

## 2015-10-16 DIAGNOSIS — N390 Urinary tract infection, site not specified: Secondary | ICD-10-CM | POA: Diagnosis not present

## 2015-10-16 DIAGNOSIS — I69354 Hemiplegia and hemiparesis following cerebral infarction affecting left non-dominant side: Secondary | ICD-10-CM | POA: Diagnosis not present

## 2015-10-16 DIAGNOSIS — I5030 Unspecified diastolic (congestive) heart failure: Secondary | ICD-10-CM | POA: Diagnosis not present

## 2015-10-16 DIAGNOSIS — I651 Occlusion and stenosis of basilar artery: Secondary | ICD-10-CM | POA: Diagnosis not present

## 2015-10-16 DIAGNOSIS — I2510 Atherosclerotic heart disease of native coronary artery without angina pectoris: Secondary | ICD-10-CM | POA: Diagnosis not present

## 2015-10-16 DIAGNOSIS — I110 Hypertensive heart disease with heart failure: Secondary | ICD-10-CM | POA: Diagnosis not present

## 2015-10-18 DIAGNOSIS — I651 Occlusion and stenosis of basilar artery: Secondary | ICD-10-CM | POA: Diagnosis not present

## 2015-10-18 DIAGNOSIS — I2510 Atherosclerotic heart disease of native coronary artery without angina pectoris: Secondary | ICD-10-CM | POA: Diagnosis not present

## 2015-10-18 DIAGNOSIS — I5030 Unspecified diastolic (congestive) heart failure: Secondary | ICD-10-CM | POA: Diagnosis not present

## 2015-10-18 DIAGNOSIS — I110 Hypertensive heart disease with heart failure: Secondary | ICD-10-CM | POA: Diagnosis not present

## 2015-10-18 DIAGNOSIS — N390 Urinary tract infection, site not specified: Secondary | ICD-10-CM | POA: Diagnosis not present

## 2015-10-18 DIAGNOSIS — I69354 Hemiplegia and hemiparesis following cerebral infarction affecting left non-dominant side: Secondary | ICD-10-CM | POA: Diagnosis not present

## 2015-10-22 DIAGNOSIS — N390 Urinary tract infection, site not specified: Secondary | ICD-10-CM | POA: Diagnosis not present

## 2015-10-22 DIAGNOSIS — I110 Hypertensive heart disease with heart failure: Secondary | ICD-10-CM | POA: Diagnosis not present

## 2015-10-22 DIAGNOSIS — I2510 Atherosclerotic heart disease of native coronary artery without angina pectoris: Secondary | ICD-10-CM | POA: Diagnosis not present

## 2015-10-22 DIAGNOSIS — I69354 Hemiplegia and hemiparesis following cerebral infarction affecting left non-dominant side: Secondary | ICD-10-CM | POA: Diagnosis not present

## 2015-10-22 DIAGNOSIS — I5030 Unspecified diastolic (congestive) heart failure: Secondary | ICD-10-CM | POA: Diagnosis not present

## 2015-10-22 DIAGNOSIS — I651 Occlusion and stenosis of basilar artery: Secondary | ICD-10-CM | POA: Diagnosis not present

## 2015-10-23 DIAGNOSIS — I69354 Hemiplegia and hemiparesis following cerebral infarction affecting left non-dominant side: Secondary | ICD-10-CM | POA: Diagnosis not present

## 2015-10-23 DIAGNOSIS — I110 Hypertensive heart disease with heart failure: Secondary | ICD-10-CM | POA: Diagnosis not present

## 2015-10-23 DIAGNOSIS — I5030 Unspecified diastolic (congestive) heart failure: Secondary | ICD-10-CM | POA: Diagnosis not present

## 2015-10-23 DIAGNOSIS — N390 Urinary tract infection, site not specified: Secondary | ICD-10-CM | POA: Diagnosis not present

## 2015-10-23 DIAGNOSIS — I2510 Atherosclerotic heart disease of native coronary artery without angina pectoris: Secondary | ICD-10-CM | POA: Diagnosis not present

## 2015-10-23 DIAGNOSIS — I651 Occlusion and stenosis of basilar artery: Secondary | ICD-10-CM | POA: Diagnosis not present

## 2015-10-24 DIAGNOSIS — I5030 Unspecified diastolic (congestive) heart failure: Secondary | ICD-10-CM | POA: Diagnosis not present

## 2015-10-24 DIAGNOSIS — I69354 Hemiplegia and hemiparesis following cerebral infarction affecting left non-dominant side: Secondary | ICD-10-CM | POA: Diagnosis not present

## 2015-10-24 DIAGNOSIS — N390 Urinary tract infection, site not specified: Secondary | ICD-10-CM | POA: Diagnosis not present

## 2015-10-24 DIAGNOSIS — I110 Hypertensive heart disease with heart failure: Secondary | ICD-10-CM | POA: Diagnosis not present

## 2015-10-24 DIAGNOSIS — I2510 Atherosclerotic heart disease of native coronary artery without angina pectoris: Secondary | ICD-10-CM | POA: Diagnosis not present

## 2015-10-24 DIAGNOSIS — I651 Occlusion and stenosis of basilar artery: Secondary | ICD-10-CM | POA: Diagnosis not present

## 2015-10-28 ENCOUNTER — Encounter: Payer: Self-pay | Admitting: Cardiovascular Disease

## 2015-10-28 ENCOUNTER — Ambulatory Visit (INDEPENDENT_AMBULATORY_CARE_PROVIDER_SITE_OTHER): Payer: Medicare Other | Admitting: Cardiovascular Disease

## 2015-10-28 VITALS — BP 124/82 | HR 60 | Ht 61.5 in | Wt 171.6 lb

## 2015-10-28 DIAGNOSIS — E785 Hyperlipidemia, unspecified: Secondary | ICD-10-CM

## 2015-10-28 DIAGNOSIS — I25810 Atherosclerosis of coronary artery bypass graft(s) without angina pectoris: Secondary | ICD-10-CM

## 2015-10-28 DIAGNOSIS — Z8673 Personal history of transient ischemic attack (TIA), and cerebral infarction without residual deficits: Secondary | ICD-10-CM

## 2015-10-28 DIAGNOSIS — I701 Atherosclerosis of renal artery: Secondary | ICD-10-CM

## 2015-10-28 DIAGNOSIS — I119 Hypertensive heart disease without heart failure: Secondary | ICD-10-CM | POA: Diagnosis not present

## 2015-10-28 DIAGNOSIS — I651 Occlusion and stenosis of basilar artery: Secondary | ICD-10-CM

## 2015-10-28 DIAGNOSIS — I6509 Occlusion and stenosis of unspecified vertebral artery: Secondary | ICD-10-CM

## 2015-10-28 DIAGNOSIS — I10 Essential (primary) hypertension: Secondary | ICD-10-CM | POA: Diagnosis not present

## 2015-10-28 NOTE — Progress Notes (Signed)
Patient ID: Gina Bowers, female   DOB: 1926-01-10, 82 y.o.   MRN: [id number] Patient ID: Gina Bowers, female   DOB: 1925/08/11, 82 y.o.   MRN: [id number]    Cardiology Office Note    Date:  10/28/2015   ID:  Gina Bowers, DOB [birth year], MRN [id number]  PCP:  Irven Shelling, MD  Cardiologist:   Sanda Klein, MD   Chief Complaint  Patient presents with  . Follow-up    3 month  pt c/o occasional SOB on exertion, no other Sx.    History of Present Illness:  Gina Bowers is a 82 y.o. female an extensive history of coronary and peripheral arterial disease who presents for follow-up To recent hospitalization for angina pectoris. She has not had any recent falls or new complaints of focal neurological deficits. She continues to take aspirin and Plavix and high-dose statin. He is now also taking a low-dose long-acting nitrate. She is on beta blockers, but the dose is limited by bradycardia.  She has not had any chest discomfort since hospital discharge. She does complain of fatigue and lack of energy. She has been little with her daughter, but once to return to her room at [facility].  Repeat cardiac catheterization during her recent hospital stay showed no significant change in her coronary anatomy. The stent in the right coronary artery is widely patent. The mammary artery to the LAD and SVG to diagonal bypasses are open. There is a long segment of 90% stenosis in the oblique marginal artery that was felt best managed by medical therapy. Left ventricular end-diastolic pressure was only 7 mmHg.        Her echo was unchanged: - Left ventricle: The cavity size was normal. Systolic function was  normal. The estimated ejection fraction was in the range of 60%  to 65%. Wall motion was normal; there were no regional wall  motion abnormalities. There was an increased relative  contribution of atrial contraction to ventricular filling.  Doppler parameters are consistent  with abnormal left ventricular  relaxation (grade 1 diastolic dysfunction).  In 2009 she underwent four-vessel bypass surgery. In 2012 she had chest pain while in [facility] and cardiac catheterization showed occlusion of the SVG to RCA and a stent was placed in the 95% stenosis of the native RCA. There was also 90% stenosis of the left main coronary artery with a patent LIMA to the LAD and a patent SVG to the diagonal. There is no mention of the SVG to OM (OM is described as a moderate size vessel with a 75% proximal stenosis). A nuclear stress test in 2013 was normal and she has normal left ventricular systolic function by echo as well. Echo in 2013 showed evidence of diastolic dysfunction as well as elevated filling pressures although congestive heart failure has not been a clinically evident problem. She has a history of ischemic colitis in 2010. She had a stroke in the remote past, Possibly another one in 2013. 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 She has mild short-term memory loss.   Past Medical History  Diagnosis Date  . Coronary artery disease     a. 2009 CABG 4, Dr. Servando Snare (LIMA to LAD , SVG to diagonal, SVG to circumflex, SVG to PDA); b. Cath 2012  in  Recent ill,[place] patent LIMA , Patent SVG to diagonal, occluded SVG to RCA. 5x41mm BMS->native RCA; c. 09/2015 Cath: LM nl, LAD 135m, D1 100, LCX small, OM1 90/small, OM2 70, RCA 45p, patent stent, VG->dRCA 100, VG->OM1 100, VG->Diag nl, LIMA->LAD nl->Med Rx.  . High cholesterol   . Lung nodules     TOLD SHE HAS INTERSTITUAL LUNG DISEASE  . Shortness of breath     AND WHEEZING AT TIMES--NO INHALERS  . Vertigo   . Arthritis   . GERD (gastroesophageal reflux disease)   .  Urinary incontinence   . UTI (lower urinary tract infection)     HX OF UTI'S-- LAST TIME COUPLE OF MONTHS AGO  . Hypothyroidism   . Blood transfusion 2007    AFTER HIP REPLACEMENT  . Fracture FEB 2013    FRACTURED LEFT ANKLE--NO SURGERY--WAS IN BOOT UNTIL COUPLE WEEKS AGO  . Depression   . Carotid bruit     PT'S DAUGHTER STATES PT HAD RECENT CAROTID STUDY AND WAS TOLD NO SIGNIFICANT BLOCKAGES  . [place] virus     COUPLE OF WEEKS AGO--ALL SYMTOMS RESOLVED PER PT  . Kidney stones     IN THE PAST  . Stroke ([place])     SEVERAL STROKES -TOLD SHE HAS CEREBELLUM BLOCKAGE AS RESULT OF STROKE--BUT NEUROLOGIST SAID HE DID NOT WANT TO DO ANY PROCEDURE BECAUSE OF HER AGE .  PT HAS SLIGHT LEFT FOOT DROP-DRAGS FOOT WHEN WALKING - USES WALKER  . Ischemic colitis ([place]) 2010  . Obstructive sleep apnea 2010  . Chronic diastolic CHF (congestive heart failure) ([place])     a. 07/2015 Ech: Ef 60-65%, Gr1 DD.    Past Surgical History  Procedure Laterality Date  . Coronary angioplasty  10/2010    WITH STENT PLACEMENT  . Coronary artery bypass graft  2009  . Joint replacement  2007    HIP REPLACEMENT -LEFT  . Bladder tack      1969 AND 1990  . Cystoscopy with injection  09/21/2011    Procedure: CYSTOSCOPY WITH INJECTION;  Surgeon: Ailene Rud, MD;  Location: [facility];  Service: Urology;  Laterality: N/A;  Peri Urethral Macroplastique Injection and Estring Placement  . Hernia repair    . Appendectomy    . Tonsillectomy    . Abdominal hysterectomy    . Cataract extraction    . Cardiac catheterization N/A 10/07/2015    Procedure: Left Heart Cath and Cors/Grafts Angiography;  Surgeon: Peter M Martinique, MD;  Location: [facility];  Service: Cardiovascular;  Laterality: N/A;    Outpatient Prescriptions Prior to Visit  Medication Sig Dispense Refill  . acetaminophen (TYLENOL) 500 MG tablet Take 1,000 mg by mouth every 6 (six) hours as needed for mild pain.    . Albuterol Sulfate (PROAIR RESPICLICK)  [id number] (90 Base) MCG/ACT AEPB Inhale 2 puffs into the lungs daily as needed (shortness of breath).    Marland Kitchen alendronate (FOSAMAX) 70 MG tablet Take 1 tablet (70 mg total) by mouth once a week. Take with a full glass of water on an empty stomach.mon 15 tablet 0  . aspirin EC 81 MG tablet Take 81 mg by mouth every evening.     . Calcium Carb-Cholecalciferol (CALCIUM 1000 + D PO) Take 1 tablet by mouth daily.     . clopidogrel (PLAVIX) 75 MG tablet Take 1 tablet (75 mg total) by mouth daily. 90 tablet 3  . CRANBERRY EXTRACT PO Take 500 mg by mouth daily.    Marland Kitchen  fish oil-omega-3 fatty acids 1000 MG capsule Take 1 g by mouth daily with breakfast.    . isosorbide mononitrate (IMDUR) 30 MG 24 hr tablet Take 0.5 tablets (15 mg total) by mouth daily. 30 tablet 5  . levothyroxine (SYNTHROID, LEVOTHROID) 75 MCG tablet Take 1 tablet (75 mcg total) by mouth daily before breakfast. 90 tablet 0  . losartan (COZAAR) 25 MG tablet Take 1 tablet (25 mg total) by mouth daily. 30 tablet 5  . meclizine (ANTIVERT) 25 MG tablet Take 25 mg by mouth 3 (three) times daily as needed for dizziness.    . metoprolol succinate (TOPROL-XL) 25 MG 24 hr tablet Take 1 tablet (25 mg total) by mouth daily. 30 tablet 5  . Multiple Vitamin (MULITIVITAMIN WITH MINERALS) TABS Take 1 tablet by mouth daily with breakfast.    . nitroGLYCERIN (NITROSTAT) 0.4 MG SL tablet Place 1 tablet (0.4 mg total) under the tongue every 5 (five) minutes as needed for chest pain. 25 tablet 2  . ondansetron (ZOFRAN ODT) 4 MG disintegrating tablet Take one every 6 hours for nausea 12 tablet 0  . pantoprazole (PROTONIX) 40 MG tablet Take 1 tablet (40 mg total) by mouth daily. 90 tablet 3  . Polyethyl Glycol-Propyl Glycol (SYSTANE OP) Place 1 drop into both eyes 3 (three) times daily. Patient states its the Gel form. Not sure what percentage. Take every day per patient    . Probiotic Product (PROBIOTIC PO) Take 1 tablet by mouth daily.    . rosuvastatin (CRESTOR) 20 MG  tablet Take 1 tablet (20 mg total) by mouth daily. 90 tablet 0  . sertraline (ZOLOFT) 50 MG tablet Take 1 tablet (50 mg total) by mouth daily. 135 tablet 0  . solifenacin (VESICARE) 5 MG tablet Take 1 tablet (5 mg total) by mouth daily. 30 tablet 3  . amoxicillin (AMOXIL) 500 MG capsule Take 1 capsule (500 mg total) by mouth 3 (three) times daily. 6 capsule 0   No facility-administered medications prior to visit.     Allergies:   Sulfonamide derivatives   Social History   Social History  . Marital Status: Widowed    Spouse Name: N/A  . Number of Children: N/A  . Years of Education: N/A   Social History Main Topics  . Smoking status: Never Smoker   . Smokeless tobacco: Never Used  . Alcohol Use: No  . Drug Use: No  . Sexual Activity: Not on file   Other Topics Concern  . Not on file   Social History Narrative     Family History:  The patient's family history includes Heart disease in her mother.   ROS:   Please see the history of present illness.    ROS All other systems reviewed and are negative.   PHYSICAL EXAM:   VS:  BP 124/82 mmHg  Pulse 60  Ht 5' 1.5" (1.562 m)  Wt 77.837 kg (171 lb 9.6 oz)  BMI 31.90 kg/m2   GEN: Well nourished, well developed, in no acute distress HEENT: normal Neck: no JVD, bilateral carotid bruits, louder on the right, no masses Cardiac: RRR; no murmurs, rubs, or gallops,no edema , sternotomy scar, normal radial, ulnar and pedal pulses bilaterally, equal blood pressure in the right and left upper extremities Respiratory:  clear to auscultation bilaterally, normal work of breathing GI: soft, nontender, nondistended, + BS MS: no deformity or atrophy Skin: warm and dry, no rash Neuro:  Alert and Oriented x 3, Strength and sensation are intact  Psych: euthymic mood, full affect  Wt Readings from Last 3 Encounters:  10/28/15 77.837 kg (171 lb 9.6 oz)  10/09/15 76.204 kg (168 lb)  08/16/15 77.111 kg (170 lb)      Studies/Labs Reviewed:    EKG:  EKG is ordered today.  The ekg ordered today demonstrates normal sinus rhythm, normal tracing, QTC 447 ms  Recent Labs: 10/04/2015: ALT 22 10/07/2015: BUN 9; Creatinine, Ser 0.54; Potassium 3.9; Sodium 137 10/08/2015: TSH 2.170 10/09/2015: Hemoglobin 10.8*; Platelets 218   Lipid Panel    Component Value Date/Time   CHOL 147 08/17/2015 0648   TRIG 70 08/17/2015 0648   HDL 58 08/17/2015 0648   CHOLHDL 2.5 08/17/2015 0648   VLDL 14 08/17/2015 0648   LDLCALC 75 08/17/2015 0648      ASSESSMENT:    1. Coronary artery disease involving coronary bypass graft of native heart without angina pectoris   2. Hypertensive heart disease without heart failure   3. Essential hypertension   4. Vertebrobasilar artery stenosis   5. History of stroke-March 2017 (rt brain)   6. Hyperlipidemia      PLAN:  In order of problems listed above:   1. CAD s/p CABG: Currently without any symptoms of angina pectoris. On appropriate antiplatelet, statin therapy and antianginal medications. Option for OM percutaneous revascularization and stent, but this was felt to be challenging and with low likelihood of long-term patency due to the small caliber and lengthy segment of stenosis in this vessel. 2. Diastolic dysfunction by echo: No symptoms of heart failure, no clinical signs of hypervolemia 3. HTN: No renal artery stenosis by ultrasound; would avoid excessive blood pressure reduction in view of the presence of significant atherosclerotic or vascular disease 4. ASCVD: She has severe stenosis in the vertebral basilar system with occluded left vertebral artery and left PICA, thankfully currently without any attributable symptoms. She has had a stroke in the past. 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 5. History of stroke: MRI 2014 showed remote posterior right corona radiata infarction, possibly associated with her tendency  for foot drop 6. HLP: All lipid parameters are satisfactory    Medication Adjustments/Labs and Tests Ordered: Current medicines are reviewed at length with the patient today.  Concerns regarding medicines are outlined above.  Medication changes, Labs and Tests ordered today are listed in the Patient Instructions below. Patient Instructions  Your physician recommends that you continue on your current medications as directed. Please refer to the Current Medication list given to you today.  Dr Sallyanne Kuster recommends that you schedule a follow-up appointment in 3 months.  If you need a refill on your cardiac medications before your next appointment, please call your pharmacy.       Signed, Sanda Klein, MD  10/28/2015 12:51 PM    [address], [place], [place]  [address] Phone: [phone]; Fax: [phone]

## 2015-10-28 NOTE — Patient Instructions (Signed)
Your physician recommends that you continue on your current medications as directed. Please refer to the Current Medication list given to you today.  Dr Sallyanne Kuster recommends that you schedule a follow-up appointment in 3 months.  If you need a refill on your cardiac medications before your next appointment, please call your pharmacy.

## 2015-10-29 DIAGNOSIS — I2510 Atherosclerotic heart disease of native coronary artery without angina pectoris: Secondary | ICD-10-CM | POA: Diagnosis not present

## 2015-10-29 DIAGNOSIS — I5030 Unspecified diastolic (congestive) heart failure: Secondary | ICD-10-CM | POA: Diagnosis not present

## 2015-10-29 DIAGNOSIS — N390 Urinary tract infection, site not specified: Secondary | ICD-10-CM | POA: Diagnosis not present

## 2015-10-29 DIAGNOSIS — I110 Hypertensive heart disease with heart failure: Secondary | ICD-10-CM | POA: Diagnosis not present

## 2015-10-29 DIAGNOSIS — I69354 Hemiplegia and hemiparesis following cerebral infarction affecting left non-dominant side: Secondary | ICD-10-CM | POA: Diagnosis not present

## 2015-10-29 DIAGNOSIS — I651 Occlusion and stenosis of basilar artery: Secondary | ICD-10-CM | POA: Diagnosis not present

## 2015-10-31 DIAGNOSIS — I2510 Atherosclerotic heart disease of native coronary artery without angina pectoris: Secondary | ICD-10-CM | POA: Diagnosis not present

## 2015-10-31 DIAGNOSIS — I651 Occlusion and stenosis of basilar artery: Secondary | ICD-10-CM | POA: Diagnosis not present

## 2015-10-31 DIAGNOSIS — I5030 Unspecified diastolic (congestive) heart failure: Secondary | ICD-10-CM | POA: Diagnosis not present

## 2015-10-31 DIAGNOSIS — N390 Urinary tract infection, site not specified: Secondary | ICD-10-CM | POA: Diagnosis not present

## 2015-10-31 DIAGNOSIS — I69354 Hemiplegia and hemiparesis following cerebral infarction affecting left non-dominant side: Secondary | ICD-10-CM | POA: Diagnosis not present

## 2015-10-31 DIAGNOSIS — I110 Hypertensive heart disease with heart failure: Secondary | ICD-10-CM | POA: Diagnosis not present

## 2015-11-01 ENCOUNTER — Telehealth: Payer: Self-pay

## 2015-11-01 DIAGNOSIS — I2510 Atherosclerotic heart disease of native coronary artery without angina pectoris: Secondary | ICD-10-CM | POA: Diagnosis not present

## 2015-11-01 DIAGNOSIS — I110 Hypertensive heart disease with heart failure: Secondary | ICD-10-CM | POA: Diagnosis not present

## 2015-11-01 DIAGNOSIS — I5030 Unspecified diastolic (congestive) heart failure: Secondary | ICD-10-CM | POA: Diagnosis not present

## 2015-11-01 DIAGNOSIS — I69354 Hemiplegia and hemiparesis following cerebral infarction affecting left non-dominant side: Secondary | ICD-10-CM | POA: Diagnosis not present

## 2015-11-01 DIAGNOSIS — N390 Urinary tract infection, site not specified: Secondary | ICD-10-CM | POA: Diagnosis not present

## 2015-11-01 DIAGNOSIS — I651 Occlusion and stenosis of basilar artery: Secondary | ICD-10-CM | POA: Diagnosis not present

## 2015-11-01 NOTE — Telephone Encounter (Signed)
Faxed signed plan of care back to Aundria Rud at [facility] [phone].

## 2015-11-04 ENCOUNTER — Encounter: Payer: Self-pay | Admitting: Neurology

## 2015-11-04 ENCOUNTER — Ambulatory Visit (INDEPENDENT_AMBULATORY_CARE_PROVIDER_SITE_OTHER): Payer: Medicare Other | Admitting: Neurology

## 2015-11-04 VITALS — BP 117/61 | HR 59 | Ht 61.5 in | Wt 170.2 lb

## 2015-11-04 DIAGNOSIS — E785 Hyperlipidemia, unspecified: Secondary | ICD-10-CM | POA: Insufficient documentation

## 2015-11-04 DIAGNOSIS — I10 Essential (primary) hypertension: Secondary | ICD-10-CM | POA: Diagnosis not present

## 2015-11-04 DIAGNOSIS — I701 Atherosclerosis of renal artery: Secondary | ICD-10-CM

## 2015-11-04 DIAGNOSIS — Z8673 Personal history of transient ischemic attack (TIA), and cerebral infarction without residual deficits: Secondary | ICD-10-CM | POA: Diagnosis not present

## 2015-11-04 DIAGNOSIS — I25700 Atherosclerosis of coronary artery bypass graft(s), unspecified, with unstable angina pectoris: Secondary | ICD-10-CM

## 2015-11-04 DIAGNOSIS — I25810 Atherosclerosis of coronary artery bypass graft(s) without angina pectoris: Secondary | ICD-10-CM | POA: Insufficient documentation

## 2015-11-04 DIAGNOSIS — G43809 Other migraine, not intractable, without status migrainosus: Secondary | ICD-10-CM | POA: Diagnosis not present

## 2015-11-04 DIAGNOSIS — G43109 Migraine with aura, not intractable, without status migrainosus: Secondary | ICD-10-CM

## 2015-11-04 NOTE — Patient Instructions (Signed)
-   continue ASA and plavix and crestor for stroke and cardiac prevention.  - eye flashing symptoms more consistent with ocular migraine or migraine equivalent. No need medication for prevention at this time. Can use tylenol for HA abortion. - check BP at home and record. - Follow up with your primary care physician for stroke risk factor modification. Recommend maintain blood pressure goal <130/80, diabetes with hemoglobin A1c goal below 6.5% and lipids with LDL cholesterol goal below 70 mg/dL.  - carotid doppler yearly. - let us know the timing for neuro rehab referral.  - follow up in 4 months.

## 2015-11-04 NOTE — Progress Notes (Signed)
NEUROLOGY CLINIC NEW PATIENT NOTE  NAME: Gina Bowers DOB: 07-05-1925 REFERRING PHYSICIAN: Lavone Orn, MD  I saw Gina Bowers as a new consult in the neurovascular clinic today regarding  Chief Complaint  Patient presents with  . Follow-up    Stroke follow up from March 2017, has strokes in the past saw Dr.Sethi in the past in 2007. Pt was hospitalzied in May 2017 for cardiac issues  .  HPI: Gina Bowers is a 82 y.o. female with PMH of Stroke 25 years ago with left-sided weakness but no residual, right CR small infarct in 10/2011, CAD s/p CABG in 2009, HTN, HLD who presents as a new patient for Recent admission questionable for stroke.   Patient was admitted on 08/16/2015 for left facial droop, and left eye flashing light. Patient stated that the flashing light only on the left eye temporal half. The flashing lasting about 30 minutes to 1 hour and then resolved followed by headache for several hours, was given Tylenol in ER. She cannot remember where the location of a headache. MRI showed equivocal right occipital punctate DWI signal changes, however, not convinced for any infarct at all. MRA showed chronic left VA occlusion and proximal BA stenosis. CUS and TTE unremarkable. LDL 75 and A1c 6.2. Her symptoms resolved, she was discharged with continued aspirin, Plavix and Crestor.  As per daughter, she had a stroke 25 years ago, with left facial weakness and left-sided weakness, recovered fully. She followed with Dr. Leonie Man in clinic around 2006 due to imbalance and vertigo. Had carotid Doppler showed left vertebral artery occlusion. No intervention needed and vestibular rehabilitation recommended at that time. She was in loss follow-up. In 2009, one month after her CABG procedure, she had one episode of visual changes and altered mental status, MRI negative for any acute stroke, was told to have a mini stroke at that time.   In 10/2011, patient again had left-sided weakness with left  facial droop, improved after admission. Carotid Doppler and TTE unremarkable, MRI showed small right CR acute infarct. MRA showed stable left VA occlusion and proximal BA stenosis. LDL 75 and A1c 5.7. She was continued on aspirin Plavix and Crestor. MRI brain in 2014 also showed no acute stroke.   As per patient and daughter, patient had several times of left eye flashing light episodes for the last 20 years. Last 2 times of left eye flashing light was in 07/2015 and 09/2015 when she was admitted for chest pain. Most of time not associated with headache. However, the last episode in May 2017, she did have headache one hour after flashing light episode relieved by Tylenol although lasting for several hours.  Patient also had a history of hypertension, hyperlipidemia, for which she is on Crestor, losartan, metoprolol. She had CAD s/p CABG in 2009 as well as stenting, continues to have unstable angina in May 2017 and the following with cardiology at this time.  She denies any cigarette smoking, alcohol, or illicit drugs.  Past Medical History  Diagnosis Date  . Coronary artery disease     a. 2009 CABG 4, Dr. Servando Snare (LIMA to LAD , SVG to diagonal, SVG to circumflex, SVG to PDA); b. Cath 2012 in  Recent ill,[place] patent LIMA , Patent SVG to diagonal, occluded SVG to RCA. 5x110mm BMS->native RCA; c. 09/2015 Cath: LM nl, LAD 140m, D1 100, LCX small, OM1 90/small, OM2 70, RCA 45p, patent stent, VG->dRCA 100, VG->OM1 100, VG->Diag nl, LIMA->LAD nl->Med Rx.  Marland Kitchen  High cholesterol   . Lung nodules     TOLD SHE HAS INTERSTITUAL LUNG DISEASE  . Shortness of breath     AND WHEEZING AT TIMES--NO INHALERS  . Vertigo   . Arthritis   . GERD (gastroesophageal reflux disease)   . Urinary incontinence   . UTI (lower urinary tract infection)     HX OF UTI'S-- LAST TIME COUPLE OF MONTHS AGO  . Hypothyroidism   . Blood transfusion 2007    AFTER HIP REPLACEMENT  . Fracture FEB 2013    FRACTURED LEFT ANKLE--NO  SURGERY--WAS IN BOOT UNTIL COUPLE WEEKS AGO  . Depression   . Carotid bruit     PT'S DAUGHTER STATES PT HAD RECENT CAROTID STUDY AND WAS TOLD NO SIGNIFICANT BLOCKAGES  . [place] virus     COUPLE OF WEEKS AGO--ALL SYMTOMS RESOLVED PER PT  . Kidney stones     IN THE PAST  . Stroke ([place])     SEVERAL STROKES -TOLD SHE HAS CEREBELLUM BLOCKAGE AS RESULT OF STROKE--BUT NEUROLOGIST SAID HE DID NOT WANT TO DO ANY PROCEDURE BECAUSE OF HER AGE .  PT HAS SLIGHT LEFT FOOT DROP-DRAGS FOOT WHEN WALKING - USES WALKER  . Ischemic colitis ([place]) 2010  . Obstructive sleep apnea 2010  . Chronic diastolic CHF (congestive heart failure) ([place])     a. 07/2015 Ech: Ef 60-65%, Gr1 DD.   Past Surgical History  Procedure Laterality Date  . Coronary angioplasty  10/2010    WITH STENT PLACEMENT  . Coronary artery bypass graft  2009  . Joint replacement  2007    HIP REPLACEMENT -LEFT  . Bladder tack      1969 AND 1990  . Cystoscopy with injection  09/21/2011    Procedure: CYSTOSCOPY WITH INJECTION;  Surgeon: Ailene Rud, MD;  Location: [facility];  Service: Urology;  Laterality: N/A;  Peri Urethral Macroplastique Injection and Estring Placement  . Hernia repair    . Appendectomy    . Tonsillectomy    . Abdominal hysterectomy    . Cataract extraction    . Cardiac catheterization N/A 10/07/2015    Procedure: Left Heart Cath and Cors/Grafts Angiography;  Surgeon: Peter M Martinique, MD;  Location: [facility];  Service: Cardiovascular;  Laterality: N/A;   Family History  Problem Relation Age of Onset  . Heart disease Mother    Current Outpatient Prescriptions  Medication Sig Dispense Refill  . acetaminophen (TYLENOL) 500 MG tablet Take 1,000 mg by mouth every 6 (six) hours as needed for mild pain.    . Albuterol Sulfate (PROAIR RESPICLICK) 123XX123 (90 Base) MCG/ACT AEPB Inhale 2 puffs into the lungs daily as needed (shortness of breath).    Marland Kitchen alendronate (FOSAMAX) 70 MG tablet Take 1 tablet (70 mg total) by  mouth once a week. Take with a full glass of water on an empty stomach.mon 15 tablet 0  . aspirin EC 81 MG tablet Take 81 mg by mouth every evening.     . Calcium Carb-Cholecalciferol (CALCIUM 1000 + D PO) Take 1 tablet by mouth daily.     . cephALEXin (KEFLEX) 250 MG capsule Take 1 capsule by mouth daily.    . clopidogrel (PLAVIX) 75 MG tablet Take 1 tablet (75 mg total) by mouth daily. 90 tablet 3  . CRANBERRY EXTRACT PO Take 500 mg by mouth daily.    . fish oil-omega-3 fatty acids 1000 MG capsule Take 1 g by mouth daily with breakfast.    .  isosorbide mononitrate (IMDUR) 30 MG 24 hr tablet Take 0.5 tablets (15 mg total) by mouth daily. 30 tablet 5  . levothyroxine (SYNTHROID, LEVOTHROID) 75 MCG tablet Take 1 tablet (75 mcg total) by mouth daily before breakfast. 90 tablet 0  . losartan (COZAAR) 25 MG tablet Take 1 tablet (25 mg total) by mouth daily. 30 tablet 5  . meclizine (ANTIVERT) 25 MG tablet Take 25 mg by mouth 3 (three) times daily as needed for dizziness.    . metoprolol succinate (TOPROL-XL) 25 MG 24 hr tablet Take 1 tablet (25 mg total) by mouth daily. 30 tablet 5  . Multiple Vitamin (MULITIVITAMIN WITH MINERALS) TABS Take 1 tablet by mouth daily with breakfast.    . nitroGLYCERIN (NITROSTAT) 0.4 MG SL tablet Place 1 tablet (0.4 mg total) under the tongue every 5 (five) minutes as needed for chest pain. 25 tablet 2  . ondansetron (ZOFRAN ODT) 4 MG disintegrating tablet Take one every 6 hours for nausea 12 tablet 0  . pantoprazole (PROTONIX) 40 MG tablet Take 1 tablet (40 mg total) by mouth daily. 90 tablet 3  . Polyethyl Glycol-Propyl Glycol (SYSTANE OP) Place 1 drop into both eyes 3 (three) times daily. Patient states its the Gel form. Not sure what percentage. Take every day per patient    . Probiotic Product (PROBIOTIC PO) Take 1 tablet by mouth daily.    . rosuvastatin (CRESTOR) 20 MG tablet Take 1 tablet (20 mg total) by mouth daily. 90 tablet 0  . sertraline (ZOLOFT) 50 MG  tablet Take 1 tablet (50 mg total) by mouth daily. 135 tablet 0  . solifenacin (VESICARE) 5 MG tablet Take 1 tablet (5 mg total) by mouth daily. 30 tablet 3   No current facility-administered medications for this visit.   Allergies  Allergen Reactions  . Sulfonamide Derivatives Hives   Social History   Social History  . Marital Status: Widowed    Spouse Name: N/A  . Number of Children: N/A  . Years of Education: N/A   Occupational History  . Not on file.   Social History Main Topics  . Smoking status: Never Smoker   . Smokeless tobacco: Never Used  . Alcohol Use: No  . Drug Use: No  . Sexual Activity: Not on file   Other Topics Concern  . Not on file   Social History Narrative    Review of Systems Full 14 system review of systems performed and notable only for those listed, all others are neg:  Constitutional:  Activity change, fatigue Cardiovascular:  Ear/Nose/Throat:  Trouble swallowing Skin:  Eyes:  Blurry vision Respiratory:  Wheezing, SOB, choking Gastroitestinal:  Constipation Genitourinary: Incontinence of bladder, urgency Hematology/Lymphatic:  Bruise/bleed easily Endocrine:  Musculoskeletal:  Aching muscles, walking difficulty, neck pain Allergy/Immunology:   Neurological:  Memory loss, weakness Psychiatric: Sometimes confusion Sleep: Daytime sleepiness   Physical Exam  Filed Vitals:   11/04/15 1136  BP: 117/61  Pulse: 59    General - Well nourished, well developed, in no apparent distress.  Ophthalmologic - fundi not visualized due to eye movement  Cardiovascular - Regular rate and rhythm.   Neck - supple, no nuchal rigidity .  Mental Status -  Level of arousal and orientation to time, place, and person were intact. Language including expression, naming, repetition, comprehension, reading, and writing was assessed and found intact. Fund of Knowledge was assessed and was intact.  Cranial Nerves II - XII - II - Visual field intact  OU. III, IV,  VI - Extraocular movements intact. V - Facial sensation intact bilaterally. VII - Facial movement intact bilaterally. VIII - Hearing & vestibular intact bilaterally. X - Palate elevates symmetrically. XI - Chin turning & shoulder shrug intact bilaterally. XII - Tongue protrusion intact.  Motor Strength - The patient's strength was normal in all extremities except the subtle left hand dexterity difficulty and pronator drift was absent.  Bulk was normal and fasciculations were absent.   Motor Tone - Muscle tone was assessed at the neck and appendages and was normal.  Reflexes - The patient's reflexes were normal in all extremities and she had no pathological reflexes.  Sensory - Light touch, temperature/pinprick were assessed and were normal.    Coordination - The patient had normal movements in the hands with no ataxia or dysmetria.  Tremor was absent.  Gait and Station - walk with walker, mild stooped posturing.   Imaging  I have personally reviewed the radiological images below and agree with the radiology interpretations.  MRI and MRA 10/2011 10 mm acute infarct in the deepcerebral white matter on the right. Atrophy and chronic microvascular ischemia. Diffuse intracranial atherosclerotic disease. Occluded left vertebral artery  MRI and MRA 07/2012 No acute infarct. Remote infarct posterior right corona radiata. Prominent small vessel disease type changes. No intracranial hemorrhage. No intracranial mass lesion or abnormal enhancement. Global atrophy without hydrocephalus. 7 x 6 x 5 mm aneurysm medial aspect left internal carotid artery cavernous segment minimally changed since prior MR angiogram. 2.4 mm bulge lateral aspect of the left internal carotid artery pre cavernous segment is stable. Prominent intracranial atherosclerotic type changes most notable involving the posterior circulation with occluded left vertebral artery and left PICA. The review he does  still date 31.   MRI and MRA 07/2015 MRI HEAD: Equivocal findings for subcentimeter acute infarct RIGHT occipital lobe. Stable moderate to severe chronic small vessel ischemic disease and old RIGHT corona radiata/basal ganglia lacunar infarct. MRA HEAD: No acute large vessel occlusion or high-grade stenosis. 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 Chronically occluded LEFT vertebral artery, with focal moderate to high-grade stenosis proximal basilar artery. Stable at least moderate stenosis proximal RIGHT V4 segment. Luminal irregularity of the intracranial vessels compatible with atherosclerosis.  CUS 07/2015 - Bilateral; intimal wall thickening CCA. Mild soft plaque origin ICA. 1-395 ICA plaquing. Bilateral vertebral artery flow is antegrade. Left vertebral waveform is abnormal suggesting distal stenosis  TTE - - Left ventricle: The cavity size was normal. Systolic function was  normal. The estimated ejection fraction was in the range of 60%  to 65%. Wall motion was normal; there were no regional wall  motion abnormalities. There was an increased relative  contribution of atrial contraction to ventricular filling.  Doppler parameters are consistent with abnormal left ventricular  relaxation (grade 1 diastolic dysfunction).  Lab Review Component     Latest Ref Rng 11/13/2011 08/17/2015 10/08/2015  Cholesterol     0 - 200 mg/dL 157 147   Triglycerides     <150 mg/dL 117 70   HDL Cholesterol     >40 mg/dL 59 58   Total CHOL/HDL Ratio      2.7 2.5   VLDL     0 - 40 mg/dL 23 14   LDL (calc)     0 - 99 mg/dL 75 75   Hemoglobin A1C     4.8 - 5.6 % 5.7 (H) 6.2 (H)   Mean Plasma Glucose  117 (H) 131   TSH     0.350 - 4.500 uIU/mL   2.170     Assessment and Plan:   In summary, Gina Bowers is a 82 y.o. female with PMH of Stroke 25 years ago with left-sided weakness but no residual, right CR small infarct in 10/2011, CAD s/p CABG in  2009, HTN, HLD who was admitted on 08/16/2015 for left facial droop and left eye flashing light at temporal half followed by HA. MRI showed equivocal right occipital punctate DWI signal changes which is not convinced for any infarct at all. MRA showed chronic left VA occlusion and proximal BA stenosis. CUS and TTE unremarkable. LDL 75 and A1c 6.2. Her symptoms resolved, she was discharged with continued aspirin, Plavix and Crestor. Patient had several times of left eye flashing light episodes for the last 20 years. Most of time not associated with headache. However, the last two episodes in hospital, she did have HA after the visual changes. The visual symptoms more consistent with ocular migraine. No need prophylaxis as it is very infrequent. She also follows with cardiology for CAD.  - continue ASA and plavix and crestor for stroke and cardiac prevention.  - pt ocular migraine or migraine equivalent does not require medication for prevention at this time. Can use tylenol for HA abortion. - check BP at home and record. - Follow up with your primary care physician for stroke risk factor modification. Recommend maintain blood pressure goal <130/80, diabetes with hemoglobin A1c goal below 6.5% and lipids with LDL cholesterol goal below 70 mg/dL.  - carotid doppler yearly follow up. - neuro rehab referral after cardio rehab - will discuss about intracranial aneurysm next visit but will not need intervention as it is stable over time.  - follow up in 4 months.   I recommend aggressive blood pressure control with a goal <130/80 mm Hg.  Lipids should be managed intensively, with a goal LDL < 70 mg/dL.  I encouraged the patient to discuss these important issues with her primary care physician.  I counseled the patient on measures to reduce stroke risk, including the importance of medication compliance, risk factor control, exercise, healthy diet, and avoidance of smoking.  I reviewed stroke warning signs and  symptoms and appropriate actions to take if such occurs.   Thank you very much for the opportunity to participate in the care of this patient.  Please do not hesitate to call if any questions or concerns arise.  No orders of the defined types were placed in this encounter.    No orders of the defined types were placed in this encounter.    Patient Instructions  - continue ASA and plavix and crestor for stroke and cardiac prevention.  - eye flashing symptoms more consistent with ocular migraine or migraine equivalent. No need medication for prevention at this time. Can use tylenol for HA abortion. - check BP at home and record. - Follow up with your primary care physician for stroke risk factor modification. Recommend maintain blood pressure goal <130/80, diabetes with hemoglobin A1c goal below 6.5% and lipids with LDL cholesterol goal below 70 mg/dL.  - carotid doppler yearly. - let us know the timing for neuro rehab referral.  - follow up in 4 months.    Rosalin Hawking, MD PhD [facility] [address], [place], [place] [address] [phone]

## 2015-11-05 DIAGNOSIS — I110 Hypertensive heart disease with heart failure: Secondary | ICD-10-CM | POA: Diagnosis not present

## 2015-11-05 DIAGNOSIS — N390 Urinary tract infection, site not specified: Secondary | ICD-10-CM | POA: Diagnosis not present

## 2015-11-05 DIAGNOSIS — I5030 Unspecified diastolic (congestive) heart failure: Secondary | ICD-10-CM | POA: Diagnosis not present

## 2015-11-05 DIAGNOSIS — I2510 Atherosclerotic heart disease of native coronary artery without angina pectoris: Secondary | ICD-10-CM | POA: Diagnosis not present

## 2015-11-05 DIAGNOSIS — I69354 Hemiplegia and hemiparesis following cerebral infarction affecting left non-dominant side: Secondary | ICD-10-CM | POA: Diagnosis not present

## 2015-11-05 DIAGNOSIS — I651 Occlusion and stenosis of basilar artery: Secondary | ICD-10-CM | POA: Diagnosis not present

## 2015-11-07 DIAGNOSIS — I69354 Hemiplegia and hemiparesis following cerebral infarction affecting left non-dominant side: Secondary | ICD-10-CM | POA: Diagnosis not present

## 2015-11-07 DIAGNOSIS — I5030 Unspecified diastolic (congestive) heart failure: Secondary | ICD-10-CM | POA: Diagnosis not present

## 2015-11-07 DIAGNOSIS — I651 Occlusion and stenosis of basilar artery: Secondary | ICD-10-CM | POA: Diagnosis not present

## 2015-11-07 DIAGNOSIS — I110 Hypertensive heart disease with heart failure: Secondary | ICD-10-CM | POA: Diagnosis not present

## 2015-11-07 DIAGNOSIS — N390 Urinary tract infection, site not specified: Secondary | ICD-10-CM | POA: Diagnosis not present

## 2015-11-07 DIAGNOSIS — I2510 Atherosclerotic heart disease of native coronary artery without angina pectoris: Secondary | ICD-10-CM | POA: Diagnosis not present

## 2015-11-12 DIAGNOSIS — N390 Urinary tract infection, site not specified: Secondary | ICD-10-CM | POA: Diagnosis not present

## 2015-11-12 DIAGNOSIS — I5030 Unspecified diastolic (congestive) heart failure: Secondary | ICD-10-CM | POA: Diagnosis not present

## 2015-11-12 DIAGNOSIS — I69354 Hemiplegia and hemiparesis following cerebral infarction affecting left non-dominant side: Secondary | ICD-10-CM | POA: Diagnosis not present

## 2015-11-12 DIAGNOSIS — I2510 Atherosclerotic heart disease of native coronary artery without angina pectoris: Secondary | ICD-10-CM | POA: Diagnosis not present

## 2015-11-12 DIAGNOSIS — I651 Occlusion and stenosis of basilar artery: Secondary | ICD-10-CM | POA: Diagnosis not present

## 2015-11-12 DIAGNOSIS — I110 Hypertensive heart disease with heart failure: Secondary | ICD-10-CM | POA: Diagnosis not present

## 2015-11-14 DIAGNOSIS — I651 Occlusion and stenosis of basilar artery: Secondary | ICD-10-CM | POA: Diagnosis not present

## 2015-11-14 DIAGNOSIS — I69354 Hemiplegia and hemiparesis following cerebral infarction affecting left non-dominant side: Secondary | ICD-10-CM | POA: Diagnosis not present

## 2015-11-14 DIAGNOSIS — N390 Urinary tract infection, site not specified: Secondary | ICD-10-CM | POA: Diagnosis not present

## 2015-11-14 DIAGNOSIS — I2510 Atherosclerotic heart disease of native coronary artery without angina pectoris: Secondary | ICD-10-CM | POA: Diagnosis not present

## 2015-11-14 DIAGNOSIS — I5030 Unspecified diastolic (congestive) heart failure: Secondary | ICD-10-CM | POA: Diagnosis not present

## 2015-11-14 DIAGNOSIS — I110 Hypertensive heart disease with heart failure: Secondary | ICD-10-CM | POA: Diagnosis not present

## 2015-11-20 ENCOUNTER — Other Ambulatory Visit: Payer: Self-pay

## 2015-11-20 MED ORDER — ROSUVASTATIN CALCIUM 20 MG PO TABS: 20.0000 mg | ORAL_TABLET | Freq: Every day | ORAL | Status: DC

## 2015-11-22 NOTE — Telephone Encounter (Signed)
Re-faxed plan of care on 11/12/15.

## 2015-11-27 DIAGNOSIS — Z8673 Personal history of transient ischemic attack (TIA), and cerebral infarction without residual deficits: Secondary | ICD-10-CM | POA: Diagnosis not present

## 2015-11-27 DIAGNOSIS — N952 Postmenopausal atrophic vaginitis: Secondary | ICD-10-CM | POA: Diagnosis not present

## 2015-11-27 DIAGNOSIS — R32 Unspecified urinary incontinence: Secondary | ICD-10-CM | POA: Diagnosis not present

## 2015-11-27 DIAGNOSIS — N398 Other specified disorders of urinary system: Secondary | ICD-10-CM | POA: Diagnosis not present

## 2015-11-27 DIAGNOSIS — N390 Urinary tract infection, site not specified: Secondary | ICD-10-CM | POA: Diagnosis not present

## 2015-12-11 ENCOUNTER — Telehealth: Payer: Self-pay | Admitting: Cardiovascular Disease

## 2015-12-11 NOTE — Telephone Encounter (Signed)
Received records from [facility] for appointment on 02/05/16 with Dr Sallyanne Kuster.  Records given to [facility] (medical records) for Dr Croitoru's schedule on 02/05/16. lp

## 2015-12-13 ENCOUNTER — Telehealth: Payer: Self-pay

## 2015-12-13 NOTE — Telephone Encounter (Signed)
Faxed signed order to Rosina Lowenstein at [facility]

## 2016-02-05 ENCOUNTER — Encounter: Payer: Self-pay | Admitting: Cardiovascular Disease

## 2016-02-05 ENCOUNTER — Ambulatory Visit (INDEPENDENT_AMBULATORY_CARE_PROVIDER_SITE_OTHER): Payer: Medicare Other | Admitting: Cardiovascular Disease

## 2016-02-05 VITALS — BP 131/79 | HR 60 | Ht 61.5 in | Wt 176.4 lb

## 2016-02-05 DIAGNOSIS — I119 Hypertensive heart disease without heart failure: Secondary | ICD-10-CM | POA: Diagnosis not present

## 2016-02-05 DIAGNOSIS — I6509 Occlusion and stenosis of unspecified vertebral artery: Secondary | ICD-10-CM

## 2016-02-05 DIAGNOSIS — I25708 Atherosclerosis of coronary artery bypass graft(s), unspecified, with other forms of angina pectoris: Secondary | ICD-10-CM

## 2016-02-05 DIAGNOSIS — E785 Hyperlipidemia, unspecified: Secondary | ICD-10-CM

## 2016-02-05 DIAGNOSIS — I651 Occlusion and stenosis of basilar artery: Secondary | ICD-10-CM | POA: Diagnosis not present

## 2016-02-05 DIAGNOSIS — I10 Essential (primary) hypertension: Secondary | ICD-10-CM

## 2016-02-05 DIAGNOSIS — Z8673 Personal history of transient ischemic attack (TIA), and cerebral infarction without residual deficits: Secondary | ICD-10-CM

## 2016-02-05 DIAGNOSIS — I701 Atherosclerosis of renal artery: Secondary | ICD-10-CM | POA: Diagnosis not present

## 2016-02-05 NOTE — Patient Instructions (Signed)
Dr Croitoru recommends that you schedule a follow-up appointment in 6 months. You will receive a reminder letter in the mail two months in advance. If you don't receive a letter, please call our office to schedule the follow-up appointment.  If you need a refill on your cardiac medications before your next appointment, please call your pharmacy. 

## 2016-02-05 NOTE — Progress Notes (Signed)
Patient ID: Gina Bowers, female   DOB: 08-Nov-1925, 82 y.o.   MRN: [id number] Patient ID: Gina Bowers, female   DOB: [birth year], 82 y.o.   MRN: [id number]    Cardiology Office Note    Date:  02/07/2016   ID:  Gina Bowers, DOB [birth year], MRN [id number]  PCP:  Gina Shelling, MD  Cardiologist:   Gina Klein, MD   Chief Complaint  Patient presents with  . Follow-up    3 Months;sob; andomly. dizzines; randomly.    History of Present Illness:  Gina Bowers is a 82 y.o. female an extensive history of coronary and peripheral arterial disease who presents for follow-up   She is back at [facility]. She fell yesterday on her walker. She bruised her left breast. This is her first fall in a year. She denies any focal neurological deficits. There was no preceding dizziness or syncope. She simply felt weak and unsteady. She describes herself as having more good days than bad days since her last appointment. Her bad days are generally related to fatigue and weakness. She does not have angina pectoris.   She continues to take aspirin and Plavix and high-dose statin, low-dose long-acting nitrates and beta blockers, dose limited by bradycardia.  Repeat cardiac catheterization during her recent hospital stay showed no significant change in her coronary anatomy. The stent in the right coronary artery is widely patent. The mammary artery to the LAD and SVG to diagonal bypasses are open. There is a long segment of 90% stenosis in the oblique marginal artery that was felt best managed by medical therapy. Left ventricular end-diastolic pressure was only 7 mmHg.        Her echo was unchanged: - Left ventricle: The cavity size was normal. Systolic function was  normal. The estimated ejection fraction was in the range of 60%  to 65%. Wall motion was normal; there were no regional wall  motion abnormalities. There was an increased relative  contribution of atrial contraction to  ventricular filling.  Doppler parameters are consistent with abnormal left ventricular  relaxation (grade 1 diastolic dysfunction).  In 2009 she underwent four-vessel bypass surgery. In 2012 she had chest pain while in [facility] and cardiac catheterization showed occlusion of the SVG to RCA and a stent was placed in the 95% stenosis of the native RCA. There was also 90% stenosis of the left main coronary artery with a patent LIMA to the LAD and a patent SVG to the diagonal. There is no mention of the SVG to OM (OM is described as a moderate size vessel with a 75% proximal stenosis). A nuclear stress test in 2013 was normal and she has normal left ventricular systolic function by echo as well. Echo in 2013 showed evidence of diastolic dysfunction as well as elevated filling pressures although congestive heart failure has not been a clinically evident problem. She has a history of ischemic colitis in 2010. She had a stroke in the remote past, Possibly another one in 2013. 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 She has mild short-term memory loss.   Past Medical History:  Diagnosis Date  . Arthritis   . Blood transfusion 2007   AFTER HIP REPLACEMENT  . Carotid bruit    PT'S DAUGHTER STATES PT HAD RECENT  CAROTID STUDY AND WAS TOLD NO SIGNIFICANT BLOCKAGES  . Chronic diastolic CHF (congestive heart failure) ([place])    a. 07/2015 Ech: Ef 60-65%, Gr1 DD.  Marland Kitchen Coronary artery disease    a. 2009 CABG 4, Dr. Servando Snare (LIMA to LAD , SVG to diagonal, SVG to circumflex, SVG to PDA); b. Cath 2012 in  Recent ill,[place] patent LIMA , Patent SVG to diagonal, occluded SVG to RCA. 5x92mm BMS->native RCA; c. 09/2015 Cath: LM nl, LAD 122m, D1 100, LCX small,  OM1 90/small, OM2 70, RCA 45p, patent stent, VG->dRCA 100, VG->OM1 100, VG->Diag nl, LIMA->LAD nl->Med Rx.  . Depression   . Fracture FEB 2013   FRACTURED LEFT ANKLE--NO SURGERY--WAS IN BOOT UNTIL COUPLE WEEKS AGO  . GERD (gastroesophageal reflux disease)   . High cholesterol   . Hypothyroidism   . Ischemic colitis ([place]) 2010  . Kidney stones    IN THE PAST  . 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 Norwalk virus    COUPLE OF WEEKS AGO--ALL SYMTOMS RESOLVED PER PT  . Obstructive sleep apnea 2010  . Shortness of breath    AND WHEEZING AT TIMES--NO INHALERS  . Stroke ([place])    SEVERAL STROKES -TOLD SHE HAS CEREBELLUM BLOCKAGE AS RESULT OF STROKE--BUT NEUROLOGIST SAID HE DID NOT WANT TO DO ANY PROCEDURE BECAUSE OF HER AGE .  PT HAS SLIGHT LEFT FOOT DROP-DRAGS FOOT WHEN WALKING - USES WALKER  . Urinary incontinence   . UTI (lower urinary tract infection)    HX OF UTI'S-- LAST TIME COUPLE OF MONTHS AGO  . Vertigo     Past Surgical History:  Procedure Laterality Date  . ABDOMINAL HYSTERECTOMY    . APPENDECTOMY    . [place] AND 1990  . CARDIAC CATHETERIZATION N/A 10/07/2015   Procedure: Left Heart Cath and Cors/Grafts Angiography;  Surgeon: Peter M Martinique, MD;  Location: [facility];  Service: Cardiovascular;  Laterality: N/A;  . CATARACT EXTRACTION    . CORONARY ANGIOPLASTY  10/2010   WITH STENT PLACEMENT  . CORONARY ARTERY BYPASS GRAFT  2009  . CYSTOSCOPY WITH INJECTION  09/21/2011   Procedure: CYSTOSCOPY WITH INJECTION;  Surgeon: Ailene Rud, MD;  Location: [facility];  Service: Urology;  Laterality: N/A;  Peri Urethral Macroplastique Injection and Estring Placement  . HERNIA REPAIR    . JOINT REPLACEMENT  2007   HIP REPLACEMENT -LEFT  . TONSILLECTOMY      Outpatient Medications Prior to Visit  Medication Sig Dispense Refill  . acetaminophen (TYLENOL) 500 MG tablet Take 1,000 mg by mouth every 6 (six) hours as needed for mild pain.      . Albuterol Sulfate (PROAIR RESPICLICK) [id number] (90 Base) MCG/ACT AEPB Inhale 2 puffs into the lungs daily as needed (shortness of breath).    Marland Kitchen alendronate (FOSAMAX) 70 MG tablet Take 1 tablet (70 mg total) by mouth once a week. Take with a full glass of water on an empty stomach.mon 15 tablet 0  . aspirin EC 81 MG tablet Take 81 mg by mouth every evening.     . Calcium Carb-Cholecalciferol (CALCIUM 1000 + D PO) Take 1 tablet by mouth daily.     . cephALEXin (KEFLEX) 250 MG capsule Take 1 capsule by mouth daily.    . clopidogrel (PLAVIX) 75 MG tablet Take 1 tablet (75 mg total) by mouth daily. 90 tablet 3  . CRANBERRY EXTRACT PO Take 500 mg by  mouth daily.    . fish oil-omega-3 fatty acids 1000 MG capsule Take 1 g by mouth daily with breakfast.    . isosorbide mononitrate (IMDUR) 30 MG 24 hr tablet Take 0.5 tablets (15 mg total) by mouth daily. 30 tablet 5  . levothyroxine (SYNTHROID, LEVOTHROID) 75 MCG tablet Take 1 tablet (75 mcg total) by mouth daily before breakfast. 90 tablet 0  . losartan (COZAAR) 25 MG tablet Take 1 tablet (25 mg total) by mouth daily. 30 tablet 5  . meclizine (ANTIVERT) 25 MG tablet Take 25 mg by mouth 3 (three) times daily as needed for dizziness.    . metoprolol succinate (TOPROL-XL) 25 MG 24 hr tablet Take 1 tablet (25 mg total) by mouth daily. 30 tablet 5  . Multiple Vitamin (MULITIVITAMIN WITH MINERALS) TABS Take 1 tablet by mouth daily with breakfast.    . nitroGLYCERIN (NITROSTAT) 0.4 MG SL tablet Place 1 tablet (0.4 mg total) under the tongue every 5 (five) minutes as needed for chest pain. 25 tablet 2  . ondansetron (ZOFRAN ODT) 4 MG disintegrating tablet Take one every 6 hours for nausea 12 tablet 0  . pantoprazole (PROTONIX) 40 MG tablet Take 1 tablet (40 mg total) by mouth daily. 90 tablet 3  . Polyethyl Glycol-Propyl Glycol (SYSTANE OP) Place 1 drop into both eyes 3 (three) times daily. Patient states its the Gel form. Not sure what percentage. Take every day per  patient    . Probiotic Product (PROBIOTIC PO) Take 1 tablet by mouth daily.    . rosuvastatin (CRESTOR) 20 MG tablet Take 1 tablet (20 mg total) by mouth daily. 90 tablet 3  . sertraline (ZOLOFT) 50 MG tablet Take 1 tablet (50 mg total) by mouth daily. 135 tablet 0  . solifenacin (VESICARE) 5 MG tablet Take 1 tablet (5 mg total) by mouth daily. 30 tablet 3   No facility-administered medications prior to visit.      Allergies:   Sulfonamide derivatives   Social History   Social History  . Marital status: Widowed    Spouse name: N/A  . Number of children: N/A  . Years of education: N/A   Social History Main Topics  . Smoking status: Never Smoker  . Smokeless tobacco: Never Used  . Alcohol use No  . Drug use: No  . Sexual activity: Not Asked   Other Topics Concern  . None   Social History Narrative  . None     Family History:  The patient's family history includes Heart disease in her mother.   ROS:   Please see the history of present illness.    ROS All other systems reviewed and are negative.   PHYSICAL EXAM:   VS:  BP 131/79   Pulse 60   Ht 5' 1.5" (1.562 m)   Wt 176 lb 6.4 oz (80 kg)   BMI 32.79 kg/m    GEN: Well nourished, well developed, in no acute distress  HEENT: normal  Neck: no JVD, bilateral carotid bruits, louder on the right, no masses Cardiac: RRR; no murmurs, rubs, or gallops,no edema , sternotomy scar, normal radial, ulnar and pedal pulses bilaterally, equal blood pressure in the right and left upper extremities Respiratory:  clear to auscultation bilaterally, normal work of breathing GI: soft, nontender, nondistended, + BS MS: no deformity or atrophy  Skin: warm and dry, no rash Neuro:  Alert and Oriented x 3, Strength and sensation are intact Psych: euthymic mood, full affect  Wt Readings from  Last 3 Encounters:  02/05/16 176 lb 6.4 oz (80 kg)  11/04/15 170 lb 3.2 oz (77.2 kg)  10/28/15 171 lb 9.6 oz (77.8 kg)      Studies/Labs  Reviewed:   EKG:  EKG is ordered today.  The ekg ordered today demonstrates normal sinus rhythm, normal tracing, QTC 447 ms  Recent Labs: 10/04/2015: ALT 22 10/07/2015: BUN 9; Creatinine, Ser 0.54; Potassium 3.9; Sodium 137 10/08/2015: TSH 2.170 10/09/2015: Hemoglobin 10.8; Platelets 218   Lipid Panel    Component Value Date/Time   CHOL 147 08/17/2015 0648   TRIG 70 08/17/2015 0648   HDL 58 08/17/2015 0648   CHOLHDL 2.5 08/17/2015 0648   VLDL 14 08/17/2015 0648   LDLCALC 75 08/17/2015 0648      ASSESSMENT:    1. Coronary artery disease involving coronary bypass graft of native heart with other forms of angina pectoris ([place])   2. Hypertensive heart disease without heart failure   3. Essential hypertension   4. Vertebrobasilar artery stenosis   5. History of stroke-March 2017 (rt brain)   6. Hyperlipidemia      PLAN:  In order of problems listed above:   1. CAD s/p CABG: Currently without any symptoms of angina pectoris. On appropriate antiplatelet, statin therapy and antianginal medications. Option for OM percutaneous revascularization and stent, but this was felt to be challenging and with low likelihood of long-term patency due to the small caliber and lengthy segment of stenosis in this vessel. 2. Diastolic dysfunction by echo: No symptoms of heart failure, no clinical signs of hypervolemia. Try to avoid diuretics. 3. HTN: No renal artery stenosis by ultrasound; would avoid excessive blood pressure reduction in view of the presence of significant atherosclerotic cerebrovascular disease. Have asked her daughter to try to check her blood pressure when she has one of her "bad days" and feels weak. We may have to retreat on her antihypertensive medications.  4. ASCVD: Her recent fall sounds more like a trip than a true "drop attack", but need to consider that possibility. She has severe stenosis in the vertebral basilar system with occluded left vertebral artery and left PICA,  thankfully currently without any attributable symptoms. She has had a stroke in the past. 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 5. History of stroke: MRI 2014 showed remote posterior right corona radiata infarction, possibly associated with her tendency for foot drop 6. HLP: All lipid parameters are satisfactory    Medication Adjustments/Labs and Tests Ordered: Current medicines are reviewed at length with the patient today.  Concerns regarding medicines are outlined above.  Medication changes, Labs and Tests ordered today are listed in the Patient Instructions below. Patient Instructions  Dr Sallyanne Kuster recommends that you schedule a follow-up appointment in 6 months. You will receive a reminder letter in the mail two months in advance. If you don't receive a letter, please call our office to schedule the follow-up appointment.  If you need a refill on your cardiac medications before your next appointment, please call your pharmacy.      Signed, Gina Klein, MD  02/07/2016 12:00 PM    [address], [place], [place]  [address] Phone: [phone]; Fax: [phone]

## 2016-02-07 ENCOUNTER — Encounter: Payer: Self-pay | Admitting: Cardiovascular Disease

## 2016-02-20 DIAGNOSIS — Z23 Encounter for immunization: Secondary | ICD-10-CM | POA: Diagnosis not present

## 2016-02-21 ENCOUNTER — Telehealth: Payer: Self-pay

## 2016-02-21 NOTE — Telephone Encounter (Signed)
Patient's daughter, [name], brought in handicap form to be signed by Dr C. Form signed and placed at front desk for pickup. Daughter notified.

## 2016-03-05 ENCOUNTER — Emergency Department (HOSPITAL_COMMUNITY)
Admission: EM | Admit: 2016-03-05 | Discharge: 2016-03-05 | Disposition: A | Discharge status: Home / Self Care | Payer: Medicare Other | Attending: Emergency Medicine | Admitting: Emergency Medicine

## 2016-03-05 ENCOUNTER — Encounter (HOSPITAL_COMMUNITY): Payer: Self-pay | Admitting: Emergency Medicine

## 2016-03-05 DIAGNOSIS — I2510 Atherosclerotic heart disease of native coronary artery without angina pectoris: Secondary | ICD-10-CM | POA: Insufficient documentation

## 2016-03-05 DIAGNOSIS — I5032 Chronic diastolic (congestive) heart failure: Secondary | ICD-10-CM | POA: Diagnosis not present

## 2016-03-05 DIAGNOSIS — I110 Hypertensive heart disease with heart failure: Secondary | ICD-10-CM | POA: Diagnosis not present

## 2016-03-05 DIAGNOSIS — Z96642 Presence of left artificial hip joint: Secondary | ICD-10-CM | POA: Diagnosis not present

## 2016-03-05 DIAGNOSIS — Z8673 Personal history of transient ischemic attack (TIA), and cerebral infarction without residual deficits: Secondary | ICD-10-CM | POA: Diagnosis not present

## 2016-03-05 DIAGNOSIS — Z951 Presence of aortocoronary bypass graft: Secondary | ICD-10-CM | POA: Diagnosis not present

## 2016-03-05 DIAGNOSIS — Z955 Presence of coronary angioplasty implant and graft: Secondary | ICD-10-CM | POA: Insufficient documentation

## 2016-03-05 DIAGNOSIS — R030 Elevated blood-pressure reading, without diagnosis of hypertension: Secondary | ICD-10-CM | POA: Diagnosis not present

## 2016-03-05 DIAGNOSIS — Z79899 Other long term (current) drug therapy: Secondary | ICD-10-CM | POA: Insufficient documentation

## 2016-03-05 DIAGNOSIS — Z7982 Long term (current) use of aspirin: Secondary | ICD-10-CM | POA: Insufficient documentation

## 2016-03-05 DIAGNOSIS — R040 Epistaxis: Secondary | ICD-10-CM | POA: Diagnosis not present

## 2016-03-05 DIAGNOSIS — E039 Hypothyroidism, unspecified: Secondary | ICD-10-CM | POA: Insufficient documentation

## 2016-03-05 NOTE — ED Notes (Signed)
Bed: [id number] Expected date:  Expected time:  Means of arrival:  Comments: EMS-nosebleed/HTN

## 2016-03-05 NOTE — ED Provider Notes (Signed)
[facility] DEPT Provider Note   CSN: [id number] Arrival date & time: 03/05/16  1303     History   Chief Complaint Chief Complaint  Patient presents with  . Epistaxis    HPI Gina Bowers is a 82 y.o. female.  The history is provided by the patient.  Epistaxis   This is a new problem. The current episode started 1 to 2 hours ago. The problem occurs constantly. The problem has been resolved. Associated with: plavix. The bleeding has been from the left nare. She has tried applying pressure and vasoconstrictors (bleeding stopped after afrin admin with EMS) for the symptoms. The treatment provided significant relief. Her past medical history does not include bleeding disorder, sinus problems, nose-picking or frequent nosebleeds.    Past Medical History:  Diagnosis Date  . Arthritis   . Blood transfusion 2007   AFTER HIP REPLACEMENT  . Carotid bruit    PT'S DAUGHTER STATES PT HAD RECENT CAROTID STUDY AND WAS TOLD NO SIGNIFICANT BLOCKAGES  . Chronic diastolic CHF (congestive heart failure) ([place])    a. 07/2015 Ech: Ef 60-65%, Gr1 DD.  Marland Kitchen Coronary artery disease    a. 2009 CABG 4, Dr. Servando Snare (LIMA to LAD , SVG to diagonal, SVG to circumflex, SVG to PDA); b. Cath 2012 in  Recent ill,[place] patent LIMA , Patent SVG to diagonal, occluded SVG to RCA. 5x41mm BMS->native RCA; c. 09/2015 Cath: LM nl, LAD 136m, D1 100, LCX small, OM1 90/small, OM2 70, RCA 45p, patent stent, VG->dRCA 100, VG->OM1 100, VG->Diag nl, LIMA->LAD nl->Med Rx.  . Depression   . Fracture FEB 2013   FRACTURED LEFT ANKLE--NO SURGERY--WAS IN BOOT UNTIL COUPLE WEEKS AGO  . GERD (gastroesophageal reflux disease)   . High cholesterol   . Hypothyroidism   . Ischemic colitis ([place]) 2010  . Kidney stones    IN THE PAST  . 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 Norwalk virus    COUPLE OF WEEKS AGO--ALL SYMTOMS RESOLVED PER PT  . Obstructive sleep apnea 2010  . Shortness of breath    AND  WHEEZING AT TIMES--NO INHALERS  . Stroke ([place])    SEVERAL STROKES -TOLD SHE HAS CEREBELLUM BLOCKAGE AS RESULT OF STROKE--BUT NEUROLOGIST SAID HE DID NOT WANT TO DO ANY PROCEDURE BECAUSE OF HER AGE .  PT HAS SLIGHT LEFT FOOT DROP-DRAGS FOOT WHEN WALKING - USES WALKER  . Urinary incontinence   . UTI (lower urinary tract infection)    HX OF UTI'S-- LAST TIME COUPLE OF MONTHS AGO  . Vertigo     Patient Active Problem List   Diagnosis Date Noted  . Ocular migraine 11/04/2015  . Essential hypertension 11/04/2015  . HLD (hyperlipidemia) 11/04/2015  . Coronary artery disease involving coronary bypass graft of native heart 11/04/2015  . Fatigue 10/08/2015  . UTI (urinary tract infection) 10/05/2015  . Chest pain 10/04/2015  . Hypertension 10/04/2015  . Unstable angina ([place])   . Hypertensive heart disease without heart failure   . S/P CABG 2009-cath 10/07/15-medical Rx   . S/P coronary artery stent placement-2012   . Acute CVA (cerebrovascular accident) ([place]) 08/17/2015  . Possible Renovascular hypertension 07/25/2015  . Hyperlipidemia 07/25/2015  . History of stroke-March 2017 (rt brain) 07/25/2015  . Vertebrobasilar artery stenosis 07/25/2015  . CAD (coronary artery disease) of artery bypass graft 06/06/2014  . Diastolic dysfunction-grade 1 with EF 60% 06/06/2014  . CVA (cerebral vascular accident) ([place]) 11/17/2011  . TIA post cath 10/07/15  11/13/2011  . Weakness 11/13/2011  . OBSTRUCTIVE SLEEP APNEA 11/23/2008  . PULMONARY NODULE 11/23/2008  . Hypoxemia 11/23/2008  . Hypothyroidism 11/22/2008  . ANXIETY 11/22/2008  . STROKE 11/22/2008    Past Surgical History:  Procedure Laterality Date  . ABDOMINAL HYSTERECTOMY    . APPENDECTOMY    . [place] AND 1990  . CARDIAC CATHETERIZATION N/A 10/07/2015   Procedure: Left Heart Cath and Cors/Grafts Angiography;  Surgeon: Peter M Martinique, MD;  Location: [facility];  Service: Cardiovascular;  Laterality: N/A;  . CATARACT  EXTRACTION    . CORONARY ANGIOPLASTY  10/2010   WITH STENT PLACEMENT  . CORONARY ARTERY BYPASS GRAFT  2009  . CYSTOSCOPY WITH INJECTION  09/21/2011   Procedure: CYSTOSCOPY WITH INJECTION;  Surgeon: Ailene Rud, MD;  Location: [facility];  Service: Urology;  Laterality: N/A;  Peri Urethral Macroplastique Injection and Estring Placement  . HERNIA REPAIR    . JOINT REPLACEMENT  2007   HIP REPLACEMENT -LEFT  . TONSILLECTOMY      OB History    No data available       Home Medications    Prior to Admission medications   Medication Sig Start Date End Date Taking? Authorizing Provider  acetaminophen (TYLENOL) 500 MG tablet Take 1,000 mg by mouth every 6 (six) hours as needed for mild pain.   Yes Historical Provider, MD  Albuterol Sulfate (PROAIR RESPICLICK) 123XX123 (90 Base) MCG/ACT AEPB Inhale 2 puffs into the lungs daily as needed (shortness of breath).   Yes Historical Provider, MD  alendronate (FOSAMAX) 70 MG tablet Take 1 tablet (70 mg total) by mouth once a week. Take with a full glass of water on an empty stomach.mon 10/08/15  Yes Erlene Quan, PA-C  aspirin EC 81 MG tablet Take 81 mg by mouth every evening.    Yes Historical Provider, MD  Calcium Carb-Cholecalciferol (CALCIUM 1000 + D PO) Take 1 tablet by mouth every evening.    Yes Historical Provider, MD  cephALEXin (KEFLEX) 250 MG capsule Take 1 capsule by mouth every evening.  10/09/15  Yes Historical Provider, MD  clopidogrel (PLAVIX) 75 MG tablet Take 1 tablet (75 mg total) by mouth daily. 06/06/14  Yes Mihai Croitoru, MD  CRANBERRY EXTRACT PO Take 500 mg by mouth 2 (two) times daily.    Yes Historical Provider, MD  fish oil-omega-3 fatty acids 1000 MG capsule Take 1 g by mouth every evening.    Yes Historical Provider, MD  isosorbide mononitrate (IMDUR) 30 MG 24 hr tablet Take 0.5 tablets (15 mg total) by mouth daily. 10/08/15  Yes Erlene Quan, PA-C  levothyroxine (SYNTHROID, LEVOTHROID) 75 MCG tablet Take 1 tablet (75 mcg total)  by mouth daily before breakfast. 06/06/14  Yes Mihai Croitoru, MD  losartan (COZAAR) 25 MG tablet Take 1 tablet (25 mg total) by mouth daily. 10/08/15  Yes Luke K Kilroy, PA-C  meclizine (ANTIVERT) 25 MG tablet Take 25 mg by mouth 3 (three) times daily as needed for dizziness.   Yes Historical Provider, MD  metoprolol succinate (TOPROL-XL) 25 MG 24 hr tablet Take 1 tablet (25 mg total) by mouth daily. 10/08/15  Yes Erlene Quan, PA-C  Multiple Vitamin (MULITIVITAMIN WITH MINERALS) TABS Take 1 tablet by mouth daily with breakfast.   Yes Historical Provider, MD  nitroGLYCERIN (NITROSTAT) 0.4 MG SL tablet Place 1 tablet (0.4 mg total) under the tongue every 5 (five) minutes as needed for chest  pain. 10/08/15  Yes Luke K Kilroy, PA-C  ondansetron (ZOFRAN ODT) 4 MG disintegrating tablet Take one every 6 hours for nausea Patient taking differently: Take 4 mg by mouth every 6 (six) hours as needed for nausea or vomiting.  06/06/14  Yes Mihai Croitoru, MD  OVER THE COUNTER MEDICATION Take 1 capsule by mouth 2 (two) times daily. Immuplex Capsule..   Yes Historical Provider, MD  pantoprazole (PROTONIX) 40 MG tablet Take 1 tablet (40 mg total) by mouth daily. 06/04/15  Yes Mihai Croitoru, MD  Polyethyl Glycol-Propyl Glycol (SYSTANE OP) Place 1 drop into both eyes 3 (three) times daily. Patient states its the Gel form. Not sure what percentage. Take every day per patient   Yes Historical Provider, MD  rosuvastatin (CRESTOR) 20 MG tablet Take 1 tablet (20 mg total) by mouth daily. Patient taking differently: Take 20 mg by mouth every evening.  11/20/15  Yes Mihai Croitoru, MD  sertraline (ZOLOFT) 50 MG tablet Take 1 tablet (50 mg total) by mouth daily. Patient taking differently: Take 50 mg by mouth every evening.  10/08/15  Yes Erlene Quan, PA-C  solifenacin (VESICARE) 5 MG tablet Take 1 tablet (5 mg total) by mouth daily. Patient taking differently: Take 5 mg by mouth every evening.  10/09/15  Yes Rogelia Mire, NP    Family History Family History  Problem Relation Age of Onset  . Heart disease Mother     Social History Social History  Substance Use Topics  . Smoking status: Never Smoker  . Smokeless tobacco: Never Used  . Alcohol use No     Allergies   Sulfonamide derivatives   Review of Systems Review of Systems  HENT: Positive for nosebleeds.   All other systems reviewed and are negative.    Physical Exam Updated Vital Signs BP 156/74 (BP Location: Left Arm)   Pulse 67   Temp 98.6 F (37 C) (Oral)   Resp 16   Ht 5\' 1"  (1.549 m)   Wt 170 lb (77.1 kg)   SpO2 100%   BMI 32.12 kg/m   Physical Exam  Constitutional: She is oriented to person, place, and time. She appears well-developed and well-nourished. No distress.  HENT:  Head: Normocephalic.  Nose: No nasal septal hematoma.  Dried blood in left nare  Eyes: Conjunctivae are normal.  Neck: Neck supple. No tracheal deviation present.  Cardiovascular: Normal rate and regular rhythm.   Pulmonary/Chest: Effort normal. No respiratory distress.  Abdominal: Soft. She exhibits no distension.  Neurological: She is alert and oriented to person, place, and time.  Skin: Skin is warm and dry.  Psychiatric: She has a normal mood and affect.     ED Treatments / Results  Labs (all labs ordered are listed, but only abnormal results are displayed) Labs Reviewed - No data to display  EKG  EKG Interpretation None       Radiology No results found.  Procedures Procedures (including critical care time)  Medications Ordered in ED Medications - No data to display   Initial Impression / Assessment and Plan / ED Course  I have reviewed the triage vital signs and the nursing notes.  Pertinent labs & imaging results that were available during my care of the patient were reviewed by me and considered in my medical decision making (see chart for details).  Clinical Course    82 y.o. female presents with  bleeding from nose which appears to have been self-limited. No signs of anemia or  other complicating feature. Recommended direct pressure and afrin for refractory bleeding for no longer than 3 days total. Return precautions discussed for uncontrolled bleeding.   Final Clinical Impressions(s) / ED Diagnoses   Final diagnoses:  Left-sided epistaxis    New Prescriptions New Prescriptions   No medications on file     Leo Grosser, MD 03/05/16 [phone]

## 2016-03-05 NOTE — ED Triage Notes (Signed)
Pt presents via EMS with epistaxis since 11:30.  Pt states she was "just sitting" when it began, and states that she saw "several quarter-sized clots come out".  Per EMS, pt has a history of hypertension which has been higher recently x 1 month & is on plavix. Pt received one squirt of afrin in the left side.  Pt is A & O x 4 and denies SHOB, dizziness, nausea, and vomiting.

## 2016-03-09 ENCOUNTER — Ambulatory Visit (INDEPENDENT_AMBULATORY_CARE_PROVIDER_SITE_OTHER): Payer: Medicare Other | Admitting: Neurology

## 2016-03-09 ENCOUNTER — Encounter: Payer: Self-pay | Admitting: Neurology

## 2016-03-09 VITALS — BP 142/65 | HR 58 | Ht 61.5 in | Wt 177.0 lb

## 2016-03-09 DIAGNOSIS — E785 Hyperlipidemia, unspecified: Secondary | ICD-10-CM | POA: Diagnosis not present

## 2016-03-09 DIAGNOSIS — G43109 Migraine with aura, not intractable, without status migrainosus: Secondary | ICD-10-CM | POA: Diagnosis not present

## 2016-03-09 DIAGNOSIS — R040 Epistaxis: Secondary | ICD-10-CM

## 2016-03-09 DIAGNOSIS — I25810 Atherosclerosis of coronary artery bypass graft(s) without angina pectoris: Secondary | ICD-10-CM | POA: Diagnosis not present

## 2016-03-09 DIAGNOSIS — I10 Essential (primary) hypertension: Secondary | ICD-10-CM

## 2016-03-09 DIAGNOSIS — I701 Atherosclerosis of renal artery: Secondary | ICD-10-CM | POA: Diagnosis not present

## 2016-03-09 NOTE — Patient Instructions (Addendum)
-   continue ASA and plavix and crestor for stroke and cardiac prevention.  - pt ocular migraine or migraine equivalent does not require medication for prevention at this time. Can use tylenol for HA abortion. - check BP at home and record. - bleeding precautious - Follow up with your primary care physician for stroke risk factor modification. Recommend maintain blood pressure goal <130/80, diabetes with hemoglobin A1c goal below 6.5% and lipids with LDL cholesterol goal below 70 mg/dL.  - carotid doppler and MRA next visit - follow up in 6 months.

## 2016-03-09 NOTE — Progress Notes (Signed)
NEUROLOGY CLINIC NEW PATIENT NOTE  NAME: Gina Bowers DOB: August 09, 1925 REFERRING PHYSICIAN: Lavone Orn, MD  I saw Gina Bowers as a new consult in the neurovascular clinic today regarding  Chief Complaint  Patient presents with  . Follow-up    Ocular Migraine, Stroke follow up.Pt had nose bleed and was seen in the ED  .  HPI: Gina Bowers is a 82 y.o. female with PMH of Stroke 25 years ago with left-sided weakness but no residual, right CR small infarct in 10/2011, CAD s/p CABG in 2009, HTN, HLD who presents as a new patient for Recent admission questionable for stroke.   Patient was admitted on 08/16/2015 for left facial droop, and left eye flashing light. Patient stated that the flashing light only on the left eye temporal half. The flashing lasting about 30 minutes to 1 hour and then resolved followed by headache for several hours, was given Tylenol in ER. She cannot remember where the location of a headache. MRI showed equivocal right occipital punctate DWI signal changes, however, not convinced for any infarct at all. MRA showed chronic left VA occlusion and proximal BA stenosis. CUS and TTE unremarkable. LDL 75 and A1c 6.2. Her symptoms resolved, she was discharged with continued aspirin, Plavix and Crestor.  As per daughter, she had a stroke 25 years ago, with left facial weakness and left-sided weakness, recovered fully. She followed with Dr. Leonie Bowers in clinic around 2006 due to imbalance and vertigo. Had carotid Doppler showed left vertebral artery occlusion. No intervention needed and vestibular rehabilitation recommended at that time. She was in loss follow-up. In 2009, one month after her CABG procedure, she had one episode of visual changes and altered mental status, MRI negative for any acute stroke, was told to have a mini stroke at that time.   In 10/2011, patient again had left-sided weakness with left facial droop, improved after admission. Carotid Doppler and TTE  unremarkable, MRI showed small right CR acute infarct. MRA showed stable left VA occlusion and proximal BA stenosis. LDL 75 and A1c 5.7. She was continued on aspirin Plavix and Crestor. MRI brain in 2014 also showed no acute stroke.   As per patient and daughter, patient had several times of left eye flashing light episodes for the last 20 years. Last 2 times of left eye flashing light was in 07/2015 and 09/2015 when she was admitted for chest pain. Most of time not associated with headache. However, the last episode in May 2017, she did have headache one hour after flashing light episode relieved by Tylenol although lasting for several hours.  Patient also had a history of hypertension, hyperlipidemia, for which she is on Crestor, losartan, metoprolol. She had CAD s/p CABG in 2009 as well as stenting, continues to have unstable angina in May 2017 and the following with cardiology at this time.  She denies any cigarette smoking, alcohol, or illicit drugs.  Interval history: During the interval time, pt has been doing well. She was sent to ER last month for epistaxis, not able to resolve with EMS, but in ER it resolved. She was continued on ASA and plavix. Other than that, she is doing well, BP today 142/65.    Past Medical History:  Diagnosis Date  . Arthritis   . Blood transfusion 2007   AFTER HIP REPLACEMENT  . Carotid bruit    PT'S DAUGHTER STATES PT HAD RECENT CAROTID STUDY AND WAS TOLD NO SIGNIFICANT BLOCKAGES  . Chronic diastolic CHF (congestive  heart failure) ([place])    a. 07/2015 Ech: Ef 60-65%, Gr1 DD.  Marland Kitchen Coronary artery disease    a. 2009 CABG 4, Dr. Servando Bowers (LIMA to LAD , SVG to diagonal, SVG to circumflex, SVG to PDA); b. Cath 2012 in  Recent ill,[place] patent LIMA , Patent SVG to diagonal, occluded SVG to RCA. 5x62mm BMS->native RCA; c. 09/2015 Cath: LM nl, LAD 153m, D1 100, LCX small, OM1 90/small, OM2 70, RCA 45p, patent stent, VG->dRCA 100, VG->OM1 100, VG->Diag nl, LIMA->LAD  nl->Med Rx.  . Depression   . Fracture FEB 2013   FRACTURED LEFT ANKLE--NO SURGERY--WAS IN BOOT UNTIL COUPLE WEEKS AGO  . GERD (gastroesophageal reflux disease)   . High cholesterol   . Hypothyroidism   . Ischemic colitis ([place]) 2010  . Kidney stones    IN THE PAST  . 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 [place] virus    COUPLE OF WEEKS AGO--ALL SYMTOMS RESOLVED PER PT  . Obstructive sleep apnea 2010  . Shortness of breath    AND WHEEZING AT TIMES--NO INHALERS  . Stroke ([place])    SEVERAL STROKES -TOLD SHE HAS CEREBELLUM BLOCKAGE AS RESULT OF STROKE--BUT NEUROLOGIST SAID HE DID NOT WANT TO DO ANY PROCEDURE BECAUSE OF HER AGE .  PT HAS SLIGHT LEFT FOOT DROP-DRAGS FOOT WHEN WALKING - USES WALKER  . Urinary incontinence   . UTI (lower urinary tract infection)    HX OF UTI'S-- LAST TIME COUPLE OF MONTHS AGO  . Vertigo    Past Surgical History:  Procedure Laterality Date  . ABDOMINAL HYSTERECTOMY    . APPENDECTOMY    . [place] AND 1990  . CARDIAC CATHETERIZATION N/A 10/07/2015   Procedure: Left Heart Cath and Cors/Grafts Angiography;  Surgeon: Gina M Martinique, MD;  Location: [facility];  Service: Cardiovascular;  Laterality: N/A;  . CATARACT EXTRACTION    . CORONARY ANGIOPLASTY  10/2010   WITH STENT PLACEMENT  . CORONARY ARTERY BYPASS GRAFT  2009  . CYSTOSCOPY WITH INJECTION  09/21/2011   Procedure: CYSTOSCOPY WITH INJECTION;  Surgeon: Gina Rud, MD;  Location: [facility];  Service: Urology;  Laterality: N/A;  Peri Urethral Macroplastique Injection and Estring Placement  . HERNIA REPAIR    . JOINT REPLACEMENT  2007   HIP REPLACEMENT -LEFT  . TONSILLECTOMY     Family History  Problem Relation Age of Onset  . Heart disease Mother    Current Outpatient Prescriptions  Medication Sig Dispense Refill  . acetaminophen (TYLENOL) 500 MG tablet Take 1,000 mg by mouth every 6 (six) hours as needed for mild pain.    . Albuterol Sulfate  (PROAIR RESPICLICK) 123XX123 (90 Base) MCG/ACT AEPB Inhale 2 puffs into the lungs daily as needed (shortness of breath).    Marland Kitchen alendronate (FOSAMAX) 70 MG tablet Take 1 tablet (70 mg total) by mouth once a week. Take with a full glass of water on an empty stomach.mon 15 tablet 0  . aspirin EC 81 MG tablet Take 81 mg by mouth every evening.     . Calcium Carb-Cholecalciferol (CALCIUM 1000 + D PO) Take 1 tablet by mouth every evening.     . cephALEXin (KEFLEX) 250 MG capsule Take 1 capsule by mouth every evening.     . clopidogrel (PLAVIX) 75 MG tablet Take 1 tablet (75 mg total) by mouth daily. 90 tablet 3  . CRANBERRY EXTRACT PO Take 500 mg by  mouth 2 (two) times daily.     . fish oil-omega-3 fatty acids 1000 MG capsule Take 1 g by mouth every evening.     . isosorbide mononitrate (IMDUR) 30 MG 24 hr tablet Take 0.5 tablets (15 mg total) by mouth daily. 30 tablet 5  . levothyroxine (SYNTHROID, LEVOTHROID) 75 MCG tablet Take 1 tablet (75 mcg total) by mouth daily before breakfast. 90 tablet 0  . losartan (COZAAR) 25 MG tablet Take 1 tablet (25 mg total) by mouth daily. 30 tablet 5  . meclizine (ANTIVERT) 25 MG tablet Take 25 mg by mouth 3 (three) times daily as needed for dizziness.    . metoprolol succinate (TOPROL-XL) 25 MG 24 hr tablet Take 1 tablet (25 mg total) by mouth daily. 30 tablet 5  . Multiple Vitamin (MULITIVITAMIN WITH MINERALS) TABS Take 1 tablet by mouth daily with breakfast.    . nitroGLYCERIN (NITROSTAT) 0.4 MG SL tablet Place 1 tablet (0.4 mg total) under the tongue every 5 (five) minutes as needed for chest pain. 25 tablet 2  . ondansetron (ZOFRAN ODT) 4 MG disintegrating tablet Take one every 6 hours for nausea (Patient taking differently: Take 4 mg by mouth every 6 (six) hours as needed for nausea or vomiting. ) 12 tablet 0  . OVER THE COUNTER MEDICATION Take 1 capsule by mouth 2 (two) times daily. Immuplex Capsule..    . pantoprazole (PROTONIX) 40 MG tablet Take 1 tablet (40 mg  total) by mouth daily. 90 tablet 3  . Polyethyl Glycol-Propyl Glycol (SYSTANE OP) Place 1 drop into both eyes 3 (three) times daily. Patient states its the Gel form. Not sure what percentage. Take every day per patient    . rosuvastatin (CRESTOR) 20 MG tablet Take 1 tablet (20 mg total) by mouth daily. (Patient taking differently: Take 20 mg by mouth every evening. ) 90 tablet 3  . sertraline (ZOLOFT) 50 MG tablet Take 1 tablet (50 mg total) by mouth daily. (Patient taking differently: Take 50 mg by mouth every evening. ) 135 tablet 0  . solifenacin (VESICARE) 5 MG tablet Take 1 tablet (5 mg total) by mouth daily. (Patient taking differently: Take 5 mg by mouth every evening. ) 30 tablet 3   No current facility-administered medications for this visit.    Allergies  Allergen Reactions  . Sulfonamide Derivatives Hives   Social History   Social History  . Marital status: Widowed    Spouse name: N/A  . Number of children: N/A  . Years of education: N/A   Occupational History  . Not on file.   Social History Main Topics  . Smoking status: Never Smoker  . Smokeless tobacco: Never Used  . Alcohol use No  . Drug use: No  . Sexual activity: Not on file   Other Topics Concern  . Not on file   Social History Narrative  . No narrative on file    Review of Systems Full 14 system review of systems performed and notable only for those listed, all others are neg:  Constitutional:  fatigue Cardiovascular:  Ear/Nose/Throat:  Trouble swallowing Skin:  Eyes:  Blurry itching Respiratory:  Wheezing, SOB Gastroitestinal:  Constipation Genitourinary: Incontinence of bladder, urgency, frequent urination Hematology/Lymphatic:  Bruise/bleed easily Endocrine:  Musculoskeletal:  Aching muscles, walking difficulty, neck pain Allergy/Immunology:   Neurological:  weakness Psychiatric:  Sleep: frequent waking   Physical Exam  Vitals:   03/09/16 1130  BP: (!) 142/65  Pulse: (!) 58     General -  Well nourished, well developed, in no apparent distress.  Ophthalmologic - fundi not visualized due to eye movement  Cardiovascular - Regular rate and rhythm.   Neck - supple, no nuchal rigidity .  Mental Status -  Level of arousal and orientation to time, place, and person were intact. Language including expression, naming, repetition, comprehension, reading, and writing was assessed and found intact. Fund of Knowledge was assessed and was intact.  Cranial Nerves II - XII - II - Visual field intact OU. III, IV, VI - Extraocular movements intact. V - Facial sensation intact bilaterally. VII - Facial movement intact bilaterally. VIII - Hearing & vestibular intact bilaterally. X - Palate elevates symmetrically. XI - Chin turning & shoulder shrug intact bilaterally. XII - Tongue protrusion intact.  Motor Strength - The patient's strength was normal in all extremities except the subtle left hand dexterity difficulty and pronator drift was absent.  Bulk was normal and fasciculations were absent.   Motor Tone - Muscle tone was assessed at the neck and appendages and was normal.  Reflexes - The patient's reflexes were normal in all extremities and she had no pathological reflexes.  Sensory - Light touch, temperature/pinprick were assessed and were normal.    Coordination - The patient had normal movements in the hands with no ataxia or dysmetria.  Tremor was absent.  Gait and Station - walk with walker, slow wiht mild stooped posturing.   Imaging  I have personally reviewed the radiological images below and agree with the radiology interpretations.  MRI and MRA 10/2011 10 mm acute infarct in the deepcerebral white matter on the right. Atrophy and chronic microvascular ischemia. Diffuse intracranial atherosclerotic disease. Occluded left vertebral artery  MRI and MRA 07/2012 No acute infarct. Remote infarct posterior right corona radiata. Prominent small  vessel disease type changes. No intracranial hemorrhage. No intracranial mass lesion or abnormal enhancement. Global atrophy without hydrocephalus. 7 x 6 x 5 mm aneurysm medial aspect left internal carotid artery cavernous segment minimally changed since prior MR angiogram. 2.4 mm bulge lateral aspect of the left internal carotid artery pre cavernous segment is stable. Prominent intracranial atherosclerotic type changes most notable involving the posterior circulation with occluded left vertebral artery and left PICA. The review he does still date 5.   MRI and MRA 07/2015 MRI HEAD: Equivocal findings for subcentimeter acute infarct RIGHT occipital lobe. Stable moderate to severe chronic small vessel ischemic disease and old RIGHT corona radiata/basal ganglia lacunar infarct. MRA HEAD: No acute large vessel occlusion or high-grade stenosis. 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 Chronically occluded LEFT vertebral artery, with focal moderate to high-grade stenosis proximal basilar artery. Stable at least moderate stenosis proximal RIGHT V4 segment. Luminal irregularity of the intracranial vessels compatible with atherosclerosis.  CUS 07/2015 - Bilateral; intimal wall thickening CCA. Mild soft plaque origin ICA. 1-395 ICA plaquing. Bilateral vertebral artery flow is antegrade. Left vertebral waveform is abnormal suggesting distal stenosis  TTE - - Left ventricle: The cavity size was normal. Systolic function was  normal. The estimated ejection fraction was in the range of 60%  to 65%. Wall motion was normal; there were no regional wall  motion abnormalities. There was an increased relative  contribution of atrial contraction to ventricular filling.  Doppler parameters are consistent with abnormal left ventricular  relaxation (grade 1 diastolic dysfunction).  Lab Review Component     Latest Ref Rng 11/13/2011 08/17/2015 10/08/2015  Cholesterol      0 -  200 mg/dL 157 147   Triglycerides     <150 mg/dL 117 70   HDL Cholesterol     >40 mg/dL 59 58   Total CHOL/HDL Ratio      2.7 2.5   VLDL     0 - 40 mg/dL 23 14   LDL (calc)     0 - 99 mg/dL 75 75   Hemoglobin A1C     4.8 - 5.6 % 5.7 (H) 6.2 (H)   Mean Plasma Glucose      117 (H) 131   TSH     0.350 - 4.500 uIU/mL   2.170     Assessment:   Gina Bowers is a 82 y.o. female with PMH of Stroke 25 years ago with left-sided weakness but no residual, right CR small infarct in 10/2011, CAD s/p CABG in 2009, HTN, HLD who was admitted on 08/16/2015 for left facial droop and left eye flashing light at temporal half followed by HA. MRI showed equivocal right occipital punctate DWI signal changes which is not convinced for any infarct at all. MRA showed chronic left VA occlusion and proximal BA stenosis. CUS and TTE unremarkable. LDL 75 and A1c 6.2. Her symptoms resolved, she was discharged with continued aspirin, Plavix and Crestor. Patient had several times of left eye flashing light episodes for the last 20 years. Most of time not associated with headache. However, the last two episodes in hospital, she did have HA after the visual changes. The visual symptoms more consistent with ocular migraine. No need prophylaxis as it is very infrequent. She also follows with cardiology for CAD. She had recent ED visit for epistaxis and no medication change was made.  Plan: - continue ASA and plavix and crestor for stroke and cardiac prevention.  - pt ocular migraine or migraine equivalent does not require medication for prevention at this time. Can use tylenol for HA abortion. - check BP at home and record. - bleeding precautious - Follow up with your primary care physician for stroke risk factor modification. Recommend maintain blood pressure goal <130/80, diabetes with hemoglobin A1c goal below 6.5% and lipids with LDL cholesterol goal below 70 mg/dL.  - carotid doppler and MRA next visit - follow  up in 6 months.  I spent more than 25 minutes of face to face time with the patient. Greater than 50% of time was spent in counseling and coordination of care. We discussed continued monitoring of aneurysm, carotid disease, as well as bleeding precautions and visual aura management.  No orders of the defined types were placed in this encounter.   No orders of the defined types were placed in this encounter.   Patient Instructions  - continue ASA and plavix and crestor for stroke and cardiac prevention.  - pt ocular migraine or migraine equivalent does not require medication for prevention at this time. Can use tylenol for HA abortion. - check BP at home and record. - bleeding precautious - Follow up with your primary care physician for stroke risk factor modification. Recommend maintain blood pressure goal <130/80, diabetes with hemoglobin A1c goal below 6.5% and lipids with LDL cholesterol goal below 70 mg/dL.  - carotid doppler and MRA next visit - follow up in 6 months.   Rosalin Hawking, MD PhD [facility] [address], [place], [place] [address] [phone]

## 2016-03-30 ENCOUNTER — Other Ambulatory Visit: Payer: Self-pay | Admitting: Cardiology

## 2016-03-30 NOTE — Telephone Encounter (Signed)
Rx request sent to pharmacy.  

## 2016-04-01 ENCOUNTER — Other Ambulatory Visit: Payer: Self-pay | Admitting: Cardiology

## 2016-04-01 NOTE — Telephone Encounter (Signed)
Rx has been sent to the pharmacy electronically. ° °

## 2016-04-03 ENCOUNTER — Telehealth: Payer: Self-pay | Admitting: Cardiovascular Disease

## 2016-04-03 NOTE — Telephone Encounter (Signed)
Spoke with daughter [name] and patient went to ED 03/05/16 and has had several nose bleeds since then. Only one reading with nosebleeds and that was 04/02/16. Confirmed with daughter patient taking Losartan 25 mg daily, Toprol 25 mg daily, and Imdur 30 mg 1/2 daily. She does have an appointment 04/09/16 with PCP. She does not have ENT. Discussed with Jacklynn Ganong D and will increase Losartan to 50 mg daily and ok to use Afrin as needed SHORT TERM. Advised daughter and scheduled follow up with PA 04/20/16, keep f/u with PCP and call if continues to be elevated

## 2016-04-03 NOTE — Telephone Encounter (Signed)
New message      Pt c/o BP issue: STAT if pt c/o blurred vision, one-sided weakness or slurred speech  1. What are your last 5 BP readings? Nov 1 140/82, nov 6 145/74, nov 8 192/83 and 172/83 by wrist, nov 9 160/75 am and nov 9 pm during nosebleed episode 178/88, 180/86, 186/101, 169/98  2. Are you having any other symptoms (ex. Dizziness, headache, blurred vision, passed out)? Nosebleeds only 3. What is your BP issue? Pt went to the ER oct 12 because of nosebleeds.  Her bp has been high.  Not sure if bp is causing nosebleeds or if nosebleeds are causing bp to be high. Please advise

## 2016-04-06 ENCOUNTER — Telehealth: Payer: Self-pay | Admitting: Cardiovascular Disease

## 2016-04-06 NOTE — Telephone Encounter (Signed)
Ok to divide losartan to bid.  Encourage her to keep pcp appt

## 2016-04-06 NOTE — Telephone Encounter (Signed)
F/u Message  Pt call requesting to speak with Rn about telephone message from 11/10. Please call back to discuss

## 2016-04-06 NOTE — Telephone Encounter (Signed)
Thank you MCr 

## 2016-04-06 NOTE — Telephone Encounter (Signed)
Spoke to patient's daughter. As she notes, discussed BP elevations Friday w Rip Harbour and received instruction for patient to increase losartan to 50mg  daily. Aware that this may take a few days to reach intended effect. Pt still having BP elevations of 180-190/100 this AM. Daughter notes pt was late getting up this AM and so didn't take her morning meds until ~3 hrs late.  Daughter wants to try splitting the dose of losartan to 25mg  in AM and 25mg  in PM. She had been given recommendation to do this in the past. I voiced this should be OK.  Recommended to keep PCP f/u on Thursday.  Pt to see Almyra Deforest on 11/27 for BP.  Will see if any advice in interim.

## 2016-04-06 NOTE — Telephone Encounter (Signed)
Returned daughter's call w pharmD advice. Advice communicated, she voiced understanding and thanks.

## 2016-04-09 ENCOUNTER — Other Ambulatory Visit: Payer: Self-pay

## 2016-04-09 DIAGNOSIS — N3281 Overactive bladder: Secondary | ICD-10-CM | POA: Diagnosis not present

## 2016-04-09 DIAGNOSIS — I10 Essential (primary) hypertension: Secondary | ICD-10-CM | POA: Diagnosis not present

## 2016-04-09 DIAGNOSIS — R040 Epistaxis: Secondary | ICD-10-CM | POA: Diagnosis not present

## 2016-04-09 DIAGNOSIS — Z1389 Encounter for screening for other disorder: Secondary | ICD-10-CM | POA: Diagnosis not present

## 2016-04-09 DIAGNOSIS — Z0000 Encounter for general adult medical examination without abnormal findings: Secondary | ICD-10-CM | POA: Diagnosis not present

## 2016-04-09 DIAGNOSIS — R32 Unspecified urinary incontinence: Secondary | ICD-10-CM | POA: Diagnosis not present

## 2016-04-09 DIAGNOSIS — F325 Major depressive disorder, single episode, in full remission: Secondary | ICD-10-CM | POA: Diagnosis not present

## 2016-04-09 MED ORDER — SERTRALINE HCL 50 MG PO TABS: 50.0000 mg | ORAL_TABLET | Freq: Every day | ORAL | 2 refills | Status: AC

## 2016-04-20 ENCOUNTER — Encounter: Payer: Self-pay | Admitting: Physician Assistant

## 2016-04-20 ENCOUNTER — Ambulatory Visit (INDEPENDENT_AMBULATORY_CARE_PROVIDER_SITE_OTHER): Payer: Medicare Other | Admitting: Physician Assistant

## 2016-04-20 VITALS — BP 122/76 | HR 55 | Ht 61.0 in | Wt 174.0 lb

## 2016-04-20 DIAGNOSIS — I701 Atherosclerosis of renal artery: Secondary | ICD-10-CM

## 2016-04-20 DIAGNOSIS — I25810 Atherosclerosis of coronary artery bypass graft(s) without angina pectoris: Secondary | ICD-10-CM

## 2016-04-20 DIAGNOSIS — E785 Hyperlipidemia, unspecified: Secondary | ICD-10-CM

## 2016-04-20 DIAGNOSIS — I10 Essential (primary) hypertension: Secondary | ICD-10-CM | POA: Diagnosis not present

## 2016-04-20 DIAGNOSIS — E039 Hypothyroidism, unspecified: Secondary | ICD-10-CM

## 2016-04-20 MED ORDER — ISOSORBIDE MONONITRATE ER 30 MG PO TB24: 30.0000 mg | ORAL_TABLET | Freq: Every day | ORAL | 9 refills | Status: DC

## 2016-04-20 MED ORDER — LOSARTAN POTASSIUM 25 MG PO TABS: 25.0000 mg | ORAL_TABLET | Freq: Two times a day (BID) | ORAL | 9 refills | Status: DC

## 2016-04-20 NOTE — Patient Instructions (Addendum)
Medication Instructions:  INCREASE- Isosorbide 30 mg daily  Labwork: None Ordered  Testing/Procedures: None Ordered  Follow-Up: Your physician recommends that you schedule a follow-up appointment in: February with Dr croitoru   Any Other Special Instructions Will Be Listed Below (If Applicable).   If you need a refill on your cardiac medications before your next appointment, please call your pharmacy.

## 2016-04-20 NOTE — Progress Notes (Signed)
Cardiology Office Note    Date:  04/20/2016   ID:  Gina Bowers, DOB Jan 27, 1926, MRN [id number]  PCP:  Gina Shelling, MD  Cardiologist:  Gina Bowers  Chief Complaint  Patient presents with  . Follow-up    seen for Gina Bowers, uncontrolled high blood pressure    History of Present Illness:  Gina Bowers is a 82 y.o. female with PMH of HLD, hypothyroidism, OSA, CVA and CAD s/p CABG x 4 (LIMA to LAD, SVG to diag, SVG to LCx, SVG to PDA) 2009. She had a cardiac catheterization in [facility] showed patent LIMA, patent SVG to diagonal, occluded SVG to RCA, he underwent bare metal stent placement to native RCA. Nuclear stress test in 2013 was normal. She was admitted in March 2017 with left facial droop and left visual field changes. MRI showed equal finding for subcentimeter acute infarct in the right occipital lobe with old infarct. MRA showed relatively stable 7 x 4 mm left supraclinoid internal carotid artery aneurysm, chronically occluded left vertebral artery with focal moderate to high-grade stenosis in the proximal basilar artery. Echocardiogram obtained on 08/18/2015 showed EF 60-65%, G1DD. She was started on lisinopril in March with new onset of hypertension, renal artery Doppler was negative for renal artery stenosis. Lisinopril was eventually changed to losartan. She presented on 10/04/2015 to the hospital with unstable angina. Cardiac cath performed on 10/07/2015 showed patent LIMA to LAD, patent SVG to diagonal, occluded SVG to OM, occluded SVG to RCA, patent stent in the native RCA. She does have 90% OM1 lesion that could be treated percutaneously, however it is a long segment in the small artery, therefore long-term patency would be very limited. Overall coronary anatomy is unchanged, medical therapy was recommended. She was started on low-dose beta blocker and nitrate. Her previous Gina Bowers was switched to Gina Bowers as Gina Bowers is known to contribute to  hypertension. During the hospitalization, she did develop Gina Bowers, he was placed on Gina Bowers initially, however culture grew Gina Bowers which was resistant to fluoroquinolone, therefore she was switched to Gina Bowers. She was most recently seen in the emergency room for epistaxis that was self-limited, no significant anemia as noted. Only conservative management with direct pressure and Gina Bowers for refractory bleeding was recommended.  Patient presents today for cartilage office visit, the last time she has had a nosebleed was about 2 weeks ago. Her current issue seems to be uncontrolled high blood pressure. She says her blood pressure is running somewhere between 140s to 180s at home. She lives in [place] independent living facility, her blood pressure is checked twice a day, more if BP is high. Her losartan was recently increased to 25 mg twice a day. Otherwise she has no significant discomfort. She denies any headache, dizziness, shortness breath and chest discomfort. 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 Her recent TSH was normal. Previous renal artery ultrasound was negative for stenosis. There is no obvious secondary explanation for her elevated blood pressure. On further review, it seems majority of the blood pressure that was obtained was prior to her morning medication. Her blood pressure on arrival to the cardiology office today is 122/76 which is considered normal for her. I have told her that I would be happy if her systolic blood pressure is between 120s to 140s. I have recommended for her to check her blood pressure only twice a day, first time roughly 2 hours after her morning medication, the second time at  8 PM every day. I think with more scheduled blood pressure check, her blood pressure will be much better. As far as her labile blood pressure, I will increase her Imdur to 30 mg daily. Given her advanced age, I would not try to lower her blood pressure too  much as it increases the chance of fall.   Past Medical History:  Diagnosis Date  . Arthritis   . Blood transfusion 2007   AFTER HIP REPLACEMENT  . Carotid bruit    PT'S DAUGHTER STATES PT HAD RECENT CAROTID STUDY AND WAS TOLD NO SIGNIFICANT BLOCKAGES  . Chronic diastolic CHF (congestive heart failure) (Gina Bowers)    a. 07/2015 Ech: Ef 60-65%, Gr1 DD.  Marland Kitchen Coronary artery disease    a. 2009 CABG 4, Dr. Servando Bowers (LIMA to LAD , SVG to diagonal, SVG to circumflex, SVG to PDA); b. Cath 2012 in  Recent ill,[place] patent LIMA , Patent SVG to diagonal, occluded SVG to RCA. 5x32mm BMS->native RCA; c. 09/2015 Cath: LM nl, LAD 119m, D1 100, LCX small, OM1 90/small, OM2 70, RCA 45p, patent stent, VG->dRCA 100, VG->OM1 100, VG->Diag nl, LIMA->LAD nl->Med Rx.  . Depression   . Fracture FEB 2013   FRACTURED LEFT ANKLE--NO SURGERY--WAS IN BOOT UNTIL COUPLE WEEKS AGO  . GERD (gastroesophageal reflux disease)   . High cholesterol   . Hypothyroidism   . Ischemic colitis ([place]) 2010  . Kidney stones    IN THE PAST  . 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 [place] virus    COUPLE OF WEEKS AGO--ALL SYMTOMS RESOLVED PER PT  . Obstructive sleep apnea 2010  . Shortness of breath    AND WHEEZING AT TIMES--NO INHALERS  . Stroke ([place])    SEVERAL STROKES -TOLD SHE HAS CEREBELLUM BLOCKAGE AS RESULT OF STROKE--BUT NEUROLOGIST SAID HE DID NOT WANT TO DO ANY PROCEDURE BECAUSE OF HER AGE .  PT HAS SLIGHT LEFT FOOT DROP-DRAGS FOOT WHEN WALKING - USES WALKER  . Urinary incontinence   . Gina Bowers (lower urinary tract infection)    HX OF Gina Bowers'S-- LAST TIME COUPLE OF MONTHS AGO  . Vertigo     Past Surgical History:  Procedure Laterality Date  . ABDOMINAL HYSTERECTOMY    . APPENDECTOMY    . [place] AND 1990  . CARDIAC CATHETERIZATION N/A 10/07/2015   Procedure: Left Heart Cath and Cors/Grafts Angiography;  Surgeon: Gina M Martinique, MD;  Location: [facility];  Service: Cardiovascular;   Laterality: N/A;  . CATARACT EXTRACTION    . CORONARY ANGIOPLASTY  10/2010   WITH STENT PLACEMENT  . CORONARY ARTERY BYPASS GRAFT  2009  . CYSTOSCOPY WITH INJECTION  09/21/2011   Procedure: CYSTOSCOPY WITH INJECTION;  Surgeon: Ailene Rud, MD;  Location: [facility];  Service: Urology;  Laterality: N/A;  Peri Urethral Macroplastique Injection and Estring Placement  . HERNIA REPAIR    . JOINT REPLACEMENT  2007   HIP REPLACEMENT -LEFT  . TONSILLECTOMY      Current Medications: Outpatient Medications Prior to Visit  Medication Sig Dispense Refill  . acetaminophen (TYLENOL) 500 MG tablet Take 1,000 mg by mouth every 6 (six) hours as needed for mild pain.    . Albuterol Sulfate (PROAIR RESPICLICK) 123XX123 (90 Base) MCG/ACT AEPB Inhale 2 puffs into the lungs daily as needed (shortness of breath).    Marland Kitchen alendronate (FOSAMAX) 70 MG tablet Take 1 tablet (70 mg total) by mouth once a  week. Take with a full glass of water on an empty stomach.mon 15 tablet 0  . aspirin EC 81 MG tablet Take 81 mg by mouth every evening.     . Calcium Carb-Cholecalciferol (CALCIUM 1000 + D PO) Take 1 tablet by mouth every evening.     . cephALEXin (KEFLEX) 250 MG capsule Take 1 capsule by mouth every evening.     . clopidogrel (PLAVIX) 75 MG tablet Take 1 tablet (75 mg total) by mouth daily. 90 tablet 3  . CRANBERRY EXTRACT PO Take 500 mg by mouth 2 (two) times daily.     . fish oil-omega-3 fatty acids 1000 MG capsule Take 1 g by mouth every evening.     Marland Kitchen levothyroxine (SYNTHROID, LEVOTHROID) 75 MCG tablet Take 1 tablet (75 mcg total) by mouth daily before breakfast. 90 tablet 0  . meclizine (ANTIVERT) 25 MG tablet Take 25 mg by mouth 3 (three) times daily as needed for dizziness.    . metoprolol succinate (TOPROL-XL) 25 MG 24 hr tablet TAKE ONE TABLET BY MOUTH DAILY 30 tablet 6  . Multiple Vitamin (MULITIVITAMIN WITH MINERALS) TABS Take 1 tablet by mouth daily with breakfast.    . nitroGLYCERIN (NITROSTAT) 0.4 MG SL  tablet Place 1 tablet (0.4 mg total) under the tongue every 5 (five) minutes as needed for chest pain. 25 tablet 2  . ondansetron (ZOFRAN ODT) 4 MG disintegrating tablet Take one every 6 hours for nausea (Patient taking differently: Take 4 mg by mouth every 6 (six) hours as needed for nausea or vomiting. ) 12 tablet 0  . OVER THE COUNTER MEDICATION Take 1 capsule by mouth 2 (two) times daily. Immuplex Capsule..    . pantoprazole (PROTONIX) 40 MG tablet Take 1 tablet (40 mg total) by mouth daily. 90 tablet 3  . Polyethyl Glycol-Propyl Glycol (SYSTANE OP) Place 1 drop into both eyes 3 (three) times daily. Patient states its the Gel form. Not sure what percentage. Take every day per patient    . rosuvastatin (CRESTOR) 20 MG tablet Take 1 tablet (20 mg total) by mouth daily. (Patient taking differently: Take 20 mg by mouth every evening. ) 90 tablet 3  . sertraline (ZOLOFT) 50 MG tablet Take 1 tablet (50 mg total) by mouth daily. 135 tablet 2  . solifenacin (Gina Bowers) 5 MG tablet Take 1 tablet (5 mg total) by mouth daily. (Patient taking differently: Take 5 mg by mouth every evening. ) 30 tablet 3  . isosorbide mononitrate (IMDUR) 30 MG 24 hr tablet Take 0.5 tablets (15 mg total) by mouth daily. 30 tablet 5  . losartan (COZAAR) 25 MG tablet Take 25 mg by mouth as directed. 2 tablets by mouth daily     No facility-administered medications prior to visit.      Allergies:   Sulfonamide derivatives   Social History   Social History  . Marital status: Widowed    Spouse name: N/A  . Number of children: N/A  . Years of education: N/A   Social History Main Topics  . Smoking status: Never Smoker  . Smokeless tobacco: Never Used  . Alcohol use No  . Drug use: No  . Sexual activity: Not Asked   Other Topics Concern  . None   Social History Narrative  . None     Family History:  The patient's family history includes Heart disease in her mother.   ROS:   Please see the history of present  illness.    ROS All  other systems reviewed and are negative.   PHYSICAL EXAM:   VS:  BP 122/76   Pulse (!) 55   Ht 5\' 1"  (1.549 m)   Wt 174 lb (78.9 kg)   BMI 32.88 kg/m    GEN: Well nourished, well developed, in no acute distress  HEENT: normal  Neck: no JVD, carotid bruits, or masses Cardiac: RRR; no murmurs, rubs, or gallops,no edema  Respiratory:  clear to auscultation bilaterally, normal work of breathing GI: soft, nontender, nondistended, + BS MS: no deformity or atrophy  Skin: warm and dry, no rash Neuro:  Alert and Oriented x 3, Strength and sensation are intact Psych: euthymic mood, full affect  Wt Readings from Last 3 Encounters:  04/20/16 174 lb (78.9 kg)  03/09/16 177 lb (80.3 kg)  03/05/16 170 lb (77.1 kg)      Studies/Labs Reviewed:   EKG:  EKG is not ordered today.   Recent Labs: 10/04/2015: ALT 22 10/07/2015: BUN 9; Creatinine, Ser 0.54; Potassium 3.9; Sodium 137 10/08/2015: TSH 2.170 10/09/2015: Hemoglobin 10.8; Platelets 218   Lipid Panel    Component Value Date/Time   CHOL 147 08/17/2015 0648   TRIG 70 08/17/2015 0648   HDL 58 08/17/2015 0648   CHOLHDL 2.5 08/17/2015 0648   VLDL 14 08/17/2015 0648   LDLCALC 75 08/17/2015 0648    Additional studies/ records that were reviewed today include:   Myoview 11/05/2011    Echo 08/18/2015  LV EF: 60% -   65%   - Left ventricle: The cavity size was normal. Systolic function was   normal. The estimated ejection fraction was in the range of 60%   to 65%. Wall motion was normal; there were no regional wall   motion abnormalities. There was an increased relative   contribution of atrial contraction to ventricular filling.   Doppler parameters are consistent with abnormal left ventricular   relaxation (grade 1 diastolic dysfunction).     Cath 10/07/2015 Conclusion    1st Mrg lesion, 90% stenosed.  2nd Mrg lesion, 70% stenosed.  1st Diag lesion, 100% stenosed.  Mid LAD lesion, 100%  stenosed.  Prox RCA lesion, 45% stenosed.  SVG was injected .  Origin lesion, 100% stenosed.  SVG was injected .  Origin lesion, 100% stenosed.  SVG was injected is normal in caliber, and is anatomically normal.  LIMA was injected is normal in caliber, and is anatomically normal.   1. Severe 2 vessel obstructive CAD 2. Patent LIMA to the LAD 3. Patent SVG to the diagonal 4. Occluded SVG to the OM 5. Occluded SVG to the RCA 6. The stent in the RCA is still patent.  Plan: recommend medical therapy. The patient is currently on no antianginal therapy. Will start low dose beta blocker and nitrate. The first OM could be treated percutaneously but it is a long segment in a small artery and I think the long term patency would be limited.       ASSESSMENT:    1. Essential hypertension   2. Coronary artery disease involving coronary bypass graft of native heart without angina pectoris   3. Hyperlipidemia, unspecified hyperlipidemia type   4. Hypothyroidism, unspecified type      PLAN:  In order of problems listed above:  1. Hypertension: Blood pressure has been labile at home, however blood pressure is normally in the office. On further investigation, it seems her blood pressure has been high prior to her taking the blood pressure medication. We have no recorded  blood pressure measurement after her morning blood pressure medication except in the office today. I think she would be able to tolerate increasing Imdur to 30 mg daily from 15 mg daily, otherwise given her advanced age I would not try to aggressively control her blood pressure. I have discussed with the patient regarding blood pressure goal for her would be 120-140s.   2. CAD s/p CABG: Recent cath in May 2017 shows stable anatomy, small OM lesion not amenable for PCI. Continue medical therapy. No obvious angina.  3. HLD: Continue Crestor  4. Hypothyroidism: On levothyroxin.    Medication Adjustments/Labs and Tests  Ordered: Current medicines are reviewed at length with the patient today.  Concerns regarding medicines are outlined above.  Medication changes, Labs and Tests ordered today are listed in the Patient Instructions below. Patient Instructions  Medication Instructions:  INCREASE- Isosorbide 30 mg daily  Labwork: None Ordered  Testing/Procedures: None Ordered  Follow-Up: Your physician recommends that you schedule a follow-up appointment in: February with Dr croitoru   Any Other Special Instructions Will Be Listed Below (If Applicable).   If you need a refill on your cardiac medications before your next appointment, please call your pharmacy.      Hilbert Corrigan, [place]  04/20/2016 5:10 PM    [address], [place], [place]  [address] Phone: [phone]; Fax: [phone]

## 2016-05-14 DIAGNOSIS — N3000 Acute cystitis without hematuria: Secondary | ICD-10-CM | POA: Diagnosis not present

## 2016-06-05 DIAGNOSIS — N3000 Acute cystitis without hematuria: Secondary | ICD-10-CM | POA: Diagnosis not present

## 2016-06-08 ENCOUNTER — Other Ambulatory Visit: Payer: Self-pay | Admitting: Cardiovascular Disease

## 2016-06-08 MED ORDER — PANTOPRAZOLE SODIUM 40 MG PO TBEC: 40.0000 mg | DELAYED_RELEASE_TABLET | Freq: Every day | ORAL | 3 refills | Status: DC

## 2016-06-08 NOTE — Telephone Encounter (Signed)
Rx request sent to pharmacy.  

## 2016-06-10 ENCOUNTER — Inpatient Hospital Stay (HOSPITAL_COMMUNITY)
Admission: EM | Admit: 2016-06-10 | Discharge: 2016-06-19 | DRG: 871 | Disposition: A | Discharge status: Home Health | Payer: Medicare Other | Attending: Internal Medicine | Admitting: Internal Medicine

## 2016-06-10 ENCOUNTER — Emergency Department (HOSPITAL_COMMUNITY): Payer: Medicare Other

## 2016-06-10 ENCOUNTER — Encounter (HOSPITAL_COMMUNITY): Payer: Self-pay

## 2016-06-10 DIAGNOSIS — R6510 Systemic inflammatory response syndrome (SIRS) of non-infectious origin without acute organ dysfunction: Secondary | ICD-10-CM

## 2016-06-10 DIAGNOSIS — N10 Acute tubulo-interstitial nephritis: Secondary | ICD-10-CM | POA: Diagnosis not present

## 2016-06-10 DIAGNOSIS — I25810 Atherosclerosis of coronary artery bypass graft(s) without angina pectoris: Secondary | ICD-10-CM | POA: Diagnosis present

## 2016-06-10 DIAGNOSIS — E784 Other hyperlipidemia: Secondary | ICD-10-CM | POA: Diagnosis not present

## 2016-06-10 DIAGNOSIS — F419 Anxiety disorder, unspecified: Secondary | ICD-10-CM | POA: Diagnosis present

## 2016-06-10 DIAGNOSIS — Z66 Do not resuscitate: Secondary | ICD-10-CM | POA: Diagnosis present

## 2016-06-10 DIAGNOSIS — E785 Hyperlipidemia, unspecified: Secondary | ICD-10-CM | POA: Diagnosis present

## 2016-06-10 DIAGNOSIS — I2510 Atherosclerotic heart disease of native coronary artery without angina pectoris: Secondary | ICD-10-CM | POA: Diagnosis present

## 2016-06-10 DIAGNOSIS — Z7982 Long term (current) use of aspirin: Secondary | ICD-10-CM

## 2016-06-10 DIAGNOSIS — Z87440 Personal history of urinary (tract) infections: Secondary | ICD-10-CM

## 2016-06-10 DIAGNOSIS — R0602 Shortness of breath: Secondary | ICD-10-CM | POA: Diagnosis not present

## 2016-06-10 DIAGNOSIS — B974 Respiratory syncytial virus as the cause of diseases classified elsewhere: Secondary | ICD-10-CM | POA: Diagnosis present

## 2016-06-10 DIAGNOSIS — I110 Hypertensive heart disease with heart failure: Secondary | ICD-10-CM | POA: Diagnosis present

## 2016-06-10 DIAGNOSIS — I6350 Cerebral infarction due to unspecified occlusion or stenosis of unspecified cerebral artery: Secondary | ICD-10-CM | POA: Diagnosis not present

## 2016-06-10 DIAGNOSIS — I10 Essential (primary) hypertension: Secondary | ICD-10-CM | POA: Diagnosis present

## 2016-06-10 DIAGNOSIS — G9341 Metabolic encephalopathy: Secondary | ICD-10-CM | POA: Diagnosis present

## 2016-06-10 DIAGNOSIS — Z882 Allergy status to sulfonamides status: Secondary | ICD-10-CM

## 2016-06-10 DIAGNOSIS — E871 Hypo-osmolality and hyponatremia: Secondary | ICD-10-CM | POA: Diagnosis present

## 2016-06-10 DIAGNOSIS — Z8673 Personal history of transient ischemic attack (TIA), and cerebral infarction without residual deficits: Secondary | ICD-10-CM

## 2016-06-10 DIAGNOSIS — F329 Major depressive disorder, single episode, unspecified: Secondary | ICD-10-CM | POA: Diagnosis present

## 2016-06-10 DIAGNOSIS — Z8249 Family history of ischemic heart disease and other diseases of the circulatory system: Secondary | ICD-10-CM

## 2016-06-10 DIAGNOSIS — E039 Hypothyroidism, unspecified: Secondary | ICD-10-CM | POA: Diagnosis present

## 2016-06-10 DIAGNOSIS — G4733 Obstructive sleep apnea (adult) (pediatric): Secondary | ICD-10-CM | POA: Diagnosis present

## 2016-06-10 DIAGNOSIS — Z7902 Long term (current) use of antithrombotics/antiplatelets: Secondary | ICD-10-CM

## 2016-06-10 DIAGNOSIS — E669 Obesity, unspecified: Secondary | ICD-10-CM | POA: Diagnosis present

## 2016-06-10 DIAGNOSIS — N390 Urinary tract infection, site not specified: Secondary | ICD-10-CM | POA: Diagnosis present

## 2016-06-10 DIAGNOSIS — A419 Sepsis, unspecified organism: Principal | ICD-10-CM | POA: Diagnosis present

## 2016-06-10 DIAGNOSIS — Z96642 Presence of left artificial hip joint: Secondary | ICD-10-CM | POA: Diagnosis present

## 2016-06-10 DIAGNOSIS — J470 Bronchiectasis with acute lower respiratory infection: Secondary | ICD-10-CM | POA: Diagnosis present

## 2016-06-10 DIAGNOSIS — B952 Enterococcus as the cause of diseases classified elsewhere: Secondary | ICD-10-CM | POA: Diagnosis present

## 2016-06-10 DIAGNOSIS — J209 Acute bronchitis, unspecified: Secondary | ICD-10-CM | POA: Diagnosis present

## 2016-06-10 DIAGNOSIS — R0600 Dyspnea, unspecified: Secondary | ICD-10-CM

## 2016-06-10 DIAGNOSIS — R05 Cough: Secondary | ICD-10-CM | POA: Diagnosis not present

## 2016-06-10 DIAGNOSIS — Z9849 Cataract extraction status, unspecified eye: Secondary | ICD-10-CM

## 2016-06-10 DIAGNOSIS — Z7983 Long term (current) use of bisphosphonates: Secondary | ICD-10-CM

## 2016-06-10 DIAGNOSIS — Z6832 Body mass index (BMI) 32.0-32.9, adult: Secondary | ICD-10-CM

## 2016-06-10 DIAGNOSIS — Z955 Presence of coronary angioplasty implant and graft: Secondary | ICD-10-CM

## 2016-06-10 DIAGNOSIS — E7800 Pure hypercholesterolemia, unspecified: Secondary | ICD-10-CM | POA: Diagnosis present

## 2016-06-10 DIAGNOSIS — R509 Fever, unspecified: Secondary | ICD-10-CM | POA: Diagnosis not present

## 2016-06-10 DIAGNOSIS — Z951 Presence of aortocoronary bypass graft: Secondary | ICD-10-CM

## 2016-06-10 DIAGNOSIS — K219 Gastro-esophageal reflux disease without esophagitis: Secondary | ICD-10-CM | POA: Diagnosis present

## 2016-06-10 DIAGNOSIS — J208 Acute bronchitis due to other specified organisms: Secondary | ICD-10-CM | POA: Diagnosis present

## 2016-06-10 DIAGNOSIS — Z90710 Acquired absence of both cervix and uterus: Secondary | ICD-10-CM

## 2016-06-10 DIAGNOSIS — I25708 Atherosclerosis of coronary artery bypass graft(s), unspecified, with other forms of angina pectoris: Secondary | ICD-10-CM | POA: Diagnosis not present

## 2016-06-10 DIAGNOSIS — N3000 Acute cystitis without hematuria: Secondary | ICD-10-CM | POA: Diagnosis not present

## 2016-06-10 DIAGNOSIS — I5032 Chronic diastolic (congestive) heart failure: Secondary | ICD-10-CM | POA: Diagnosis present

## 2016-06-10 LAB — CBC WITH DIFFERENTIAL/PLATELET
BASOS PCT: 0 %
Basophils Absolute: 0 10*3/uL (ref 0.0–0.1)
Eosinophils Absolute: 0.7 10*3/uL (ref 0.0–0.7)
Eosinophils Relative: 6 %
HCT: 36.7 % (ref 36.0–46.0)
Hemoglobin: 12.1 g/dL (ref 12.0–15.0)
LYMPHS ABS: 1.8 10*3/uL (ref 0.7–4.0)
Lymphocytes Relative: 16 %
MCH: 30.9 pg (ref 26.0–34.0)
MCHC: 33 g/dL (ref 30.0–36.0)
MCV: 93.6 fL (ref 78.0–100.0)
MONO ABS: 0.4 10*3/uL (ref 0.1–1.0)
MONOS PCT: 3 %
NEUTROS ABS: 8.7 10*3/uL — AB (ref 1.7–7.7)
Neutrophils Relative %: 75 %
Platelets: 233 10*3/uL (ref 150–400)
RBC: 3.92 MIL/uL (ref 3.87–5.11)
RDW: 13 % (ref 11.5–15.5)
WBC: 11.6 10*3/uL — ABNORMAL HIGH (ref 4.0–10.5)

## 2016-06-10 LAB — RESPIRATORY PANEL BY PCR
Adenovirus: NOT DETECTED
Bordetella pertussis: NOT DETECTED
CHLAMYDOPHILA PNEUMONIAE-RVPPCR: NOT DETECTED
Coronavirus 229E: NOT DETECTED
Coronavirus HKU1: NOT DETECTED
Coronavirus NL63: NOT DETECTED
Coronavirus OC43: NOT DETECTED
INFLUENZA A-RVPPCR: NOT DETECTED
Influenza B: NOT DETECTED
MYCOPLASMA PNEUMONIAE-RVPPCR: NOT DETECTED
Metapneumovirus: NOT DETECTED
PARAINFLUENZA VIRUS 3-RVPPCR: NOT DETECTED
PARAINFLUENZA VIRUS 4-RVPPCR: NOT DETECTED
Parainfluenza Virus 1: NOT DETECTED
Parainfluenza Virus 2: NOT DETECTED
RESPIRATORY SYNCYTIAL VIRUS-RVPPCR: DETECTED — AB
RHINOVIRUS / ENTEROVIRUS - RVPPCR: NOT DETECTED

## 2016-06-10 LAB — URINALYSIS, COMPLETE (UACMP) WITH MICROSCOPIC
Bacteria, UA: NONE SEEN
Bilirubin Urine: NEGATIVE
GLUCOSE, UA: NEGATIVE mg/dL
HGB URINE DIPSTICK: NEGATIVE
KETONES UR: 5 mg/dL — AB
NITRITE: NEGATIVE
PH: 7 (ref 5.0–8.0)
PROTEIN: NEGATIVE mg/dL
Specific Gravity, Urine: 1.014 (ref 1.005–1.030)
Squamous Epithelial / LPF: NONE SEEN

## 2016-06-10 LAB — INFLUENZA PANEL BY PCR (TYPE A & B)
INFLBPCR: NEGATIVE
Influenza A By PCR: NEGATIVE

## 2016-06-10 LAB — BASIC METABOLIC PANEL
ANION GAP: 10 (ref 5–15)
BUN: 8 mg/dL (ref 6–20)
CHLORIDE: 96 mmol/L — AB (ref 101–111)
CO2: 26 mmol/L (ref 22–32)
Calcium: 9.1 mg/dL (ref 8.9–10.3)
Creatinine, Ser: 0.5 mg/dL (ref 0.44–1.00)
GFR calc non Af Amer: >60 mL/min (ref >60)
GLUCOSE: 107 mg/dL — AB (ref 65–99)
Potassium: 3.9 mmol/L (ref 3.5–5.1)
Sodium: 132 mmol/L — ABNORMAL LOW (ref 135–145)

## 2016-06-10 LAB — CREATININE, SERUM
CREATININE: 0.53 mg/dL (ref 0.44–1.00)
GFR calc Af Amer: >60 mL/min (ref >60)
GFR calc non Af Amer: >60 mL/min (ref >60)

## 2016-06-10 LAB — CBC
HCT: 36.1 % (ref 36.0–46.0)
Hemoglobin: 12 g/dL (ref 12.0–15.0)
MCH: 31 pg (ref 26.0–34.0)
MCHC: 33.2 g/dL (ref 30.0–36.0)
MCV: 93.3 fL (ref 78.0–100.0)
PLATELETS: 210 10*3/uL (ref 150–400)
RBC: 3.87 MIL/uL (ref 3.87–5.11)
RDW: 13.1 % (ref 11.5–15.5)
WBC: 9.4 10*3/uL (ref 4.0–10.5)

## 2016-06-10 LAB — I-STAT CG4 LACTIC ACID, ED: Lactic Acid, Venous: 0.93 mmol/L (ref 0.5–1.9)

## 2016-06-10 MED ORDER — ONDANSETRON HCL 4 MG/2ML IJ SOLN
4.0000 mg | Freq: Four times a day (QID) | INTRAMUSCULAR | Status: DC | PRN
  Administered 2016-06-10: 4 mg via INTRAVENOUS
  Filled 2016-06-10: qty 2

## 2016-06-10 MED ORDER — CLOPIDOGREL BISULFATE 75 MG PO TABS
75.0000 mg | ORAL_TABLET | Freq: Every day | ORAL | Status: DC
  Administered 2016-06-10 – 2016-06-19 (×10): 75 mg via ORAL
  Filled 2016-06-10 (×10): qty 1

## 2016-06-10 MED ORDER — ACETAMINOPHEN 325 MG PO TABS
650.0000 mg | ORAL_TABLET | Freq: Once | ORAL | Status: AC
  Administered 2016-06-10: 650 mg via ORAL
  Filled 2016-06-10: qty 2

## 2016-06-10 MED ORDER — ISOSORBIDE MONONITRATE ER 30 MG PO TB24
30.0000 mg | ORAL_TABLET | Freq: Every day | ORAL | Status: DC
  Administered 2016-06-10 – 2016-06-19 (×10): 30 mg via ORAL
  Filled 2016-06-10 (×10): qty 1

## 2016-06-10 MED ORDER — POLYVINYL ALCOHOL 1.4 % OP SOLN
1.0000 [drp] | Freq: Three times a day (TID) | OPHTHALMIC | Status: DC
  Administered 2016-06-10 – 2016-06-19 (×24): 1 [drp] via OPHTHALMIC
  Filled 2016-06-10: qty 15

## 2016-06-10 MED ORDER — SODIUM CHLORIDE 0.9 % IV BOLUS (SEPSIS)
500.0000 mL | Freq: Once | INTRAVENOUS | Status: AC
Start: 2016-06-10 — End: 2016-06-11
  Administered 2016-06-10: 500 mL via INTRAVENOUS

## 2016-06-10 MED ORDER — DEXTROSE 5 % IV SOLN
2.0000 g | Freq: Once | INTRAVENOUS | Status: AC
  Administered 2016-06-10: 2 g via INTRAVENOUS
  Filled 2016-06-10: qty 2

## 2016-06-10 MED ORDER — ONDANSETRON HCL 4 MG PO TABS: 4.0000 mg | ORAL_TABLET | Freq: Four times a day (QID) | ORAL | Status: DC | PRN

## 2016-06-10 MED ORDER — LEVOTHYROXINE SODIUM 75 MCG PO TABS
75.0000 ug | ORAL_TABLET | Freq: Every day | ORAL | Status: DC
  Administered 2016-06-11 – 2016-06-19 (×9): 75 ug via ORAL
  Filled 2016-06-10 (×3): qty 1
  Filled 2016-06-10: qty 3
  Filled 2016-06-10 (×5): qty 1

## 2016-06-10 MED ORDER — ALBUTEROL SULFATE (2.5 MG/3ML) 0.083% IN NEBU
2.5000 mg | INHALATION_SOLUTION | Freq: Every day | RESPIRATORY_TRACT | Status: DC | PRN
  Administered 2016-06-11: 2.5 mg via RESPIRATORY_TRACT
  Filled 2016-06-10: qty 3

## 2016-06-10 MED ORDER — DARIFENACIN HYDROBROMIDE ER 7.5 MG PO TB24
7.5000 mg | ORAL_TABLET | Freq: Every day | ORAL | Status: DC
  Administered 2016-06-10 – 2016-06-18 (×9): 7.5 mg via ORAL
  Filled 2016-06-10 (×10): qty 1

## 2016-06-10 MED ORDER — PANTOPRAZOLE SODIUM 40 MG PO TBEC
40.0000 mg | DELAYED_RELEASE_TABLET | Freq: Every day | ORAL | Status: DC
  Administered 2016-06-10 – 2016-06-19 (×10): 40 mg via ORAL
  Filled 2016-06-10 (×10): qty 1

## 2016-06-10 MED ORDER — OMEGA-3 FATTY ACIDS 1000 MG PO CAPS: 1.0000 g | ORAL_CAPSULE | Freq: Every evening | ORAL | Status: DC

## 2016-06-10 MED ORDER — IBUPROFEN 400 MG PO TABS
400.0000 mg | ORAL_TABLET | Freq: Once | ORAL | Status: AC
  Administered 2016-06-10: 400 mg via ORAL
  Filled 2016-06-10: qty 1

## 2016-06-10 MED ORDER — METOPROLOL SUCCINATE ER 25 MG PO TB24
25.0000 mg | ORAL_TABLET | Freq: Every day | ORAL | Status: DC
  Administered 2016-06-10 – 2016-06-19 (×10): 25 mg via ORAL
  Filled 2016-06-10 (×10): qty 1

## 2016-06-10 MED ORDER — SERTRALINE HCL 50 MG PO TABS
50.0000 mg | ORAL_TABLET | Freq: Every day | ORAL | Status: DC
  Administered 2016-06-10 – 2016-06-19 (×10): 50 mg via ORAL
  Filled 2016-06-10 (×10): qty 1

## 2016-06-10 MED ORDER — ALENDRONATE SODIUM 70 MG PO TABS: 70.0000 mg | ORAL_TABLET | ORAL | Status: DC

## 2016-06-10 MED ORDER — ACETAMINOPHEN 500 MG PO TABS
1000.0000 mg | ORAL_TABLET | Freq: Four times a day (QID) | ORAL | Status: DC | PRN
  Administered 2016-06-10 – 2016-06-12 (×2): 1000 mg via ORAL
  Filled 2016-06-10 (×2): qty 2

## 2016-06-10 MED ORDER — POLYETHYLENE GLYCOL 3350 17 G PO PACK
17.0000 g | PACK | Freq: Every day | ORAL | Status: DC | PRN
  Administered 2016-06-12 – 2016-06-15 (×3): 17 g via ORAL
  Filled 2016-06-10 (×3): qty 1

## 2016-06-10 MED ORDER — POLYETHYL GLYCOL-PROPYL GLYCOL 0.4-0.3 % OP GEL: Freq: Three times a day (TID) | OPHTHALMIC | Status: DC

## 2016-06-10 MED ORDER — ASPIRIN EC 81 MG PO TBEC
81.0000 mg | DELAYED_RELEASE_TABLET | Freq: Every evening | ORAL | Status: DC
Start: 2016-06-10 — End: 2016-06-19
  Administered 2016-06-10 – 2016-06-18 (×9): 81 mg via ORAL
  Filled 2016-06-10 (×9): qty 1

## 2016-06-10 MED ORDER — SODIUM CHLORIDE 0.9 % IV SOLN
INTRAVENOUS | Status: AC
  Administered 2016-06-10: 17:00:00 via INTRAVENOUS

## 2016-06-10 MED ORDER — NITROGLYCERIN 0.4 MG SL SUBL: 0.4000 mg | SUBLINGUAL_TABLET | SUBLINGUAL | Status: DC | PRN

## 2016-06-10 MED ORDER — IPRATROPIUM-ALBUTEROL 0.5-2.5 (3) MG/3ML IN SOLN: 3.0000 mL | Freq: Four times a day (QID) | RESPIRATORY_TRACT | Status: DC | PRN

## 2016-06-10 MED ORDER — ENOXAPARIN SODIUM 40 MG/0.4ML ~~LOC~~ SOLN
40.0000 mg | SUBCUTANEOUS | Status: DC
  Administered 2016-06-11 – 2016-06-15 (×5): 40 mg via SUBCUTANEOUS
  Filled 2016-06-10 (×6): qty 0.4

## 2016-06-10 MED ORDER — IPRATROPIUM-ALBUTEROL 0.5-2.5 (3) MG/3ML IN SOLN
3.0000 mL | Freq: Four times a day (QID) | RESPIRATORY_TRACT | Status: DC
  Administered 2016-06-10: 3 mL via RESPIRATORY_TRACT
  Filled 2016-06-10: qty 3

## 2016-06-10 MED ORDER — LOSARTAN POTASSIUM 25 MG PO TABS
25.0000 mg | ORAL_TABLET | Freq: Two times a day (BID) | ORAL | Status: DC
  Administered 2016-06-10 – 2016-06-19 (×18): 25 mg via ORAL
  Filled 2016-06-10 (×18): qty 1

## 2016-06-10 MED ORDER — ROSUVASTATIN CALCIUM 20 MG PO TABS
20.0000 mg | ORAL_TABLET | Freq: Every evening | ORAL | Status: DC
  Administered 2016-06-10 – 2016-06-18 (×9): 20 mg via ORAL
  Filled 2016-06-10 (×10): qty 1

## 2016-06-10 MED ORDER — DEXTROSE 5 % IV SOLN
1.0000 g | INTRAVENOUS | Status: DC
  Administered 2016-06-11 – 2016-06-16 (×6): 1 g via INTRAVENOUS
  Filled 2016-06-10 (×6): qty 10

## 2016-06-10 MED ORDER — WHITE PETROLATUM GEL
Status: AC
  Administered 2016-06-10: 1
  Filled 2016-06-10: qty 1

## 2016-06-10 NOTE — ED Notes (Signed)
Patient transported to X-ray 

## 2016-06-10 NOTE — ED Provider Notes (Addendum)
[facility] DEPT Provider Note   CSN: [id number] Arrival date & time: 06/10/16  1100     History   Chief Complaint Chief Complaint  Patient presents with  . Fever  . Urinary Tract Infection    HPI Gina Bowers is a 83 y.o. female.  HPI History is obtained from patient and from patient's daughter who accompanies her. Patient has been more confused over proximal past 2 days. Her daughter took urine into her urologist's office 2 days ago and was determined to have urinary tract infection. Was started on Macrobid first dose yesterday.. Patient is also had cough for the past 3-4 days, productive of slight amount of yellow sputum. Patient denies pain anywhere. She had temperature of 100.4 at home earlier today and her daughter reports that she's was confused this morning. Nothing makes symptoms better or worse. No other associated symptoms Past Medical History:  Diagnosis Date  . Arthritis   . Blood transfusion 2007   AFTER HIP REPLACEMENT  . Carotid bruit    PT'S DAUGHTER STATES PT HAD RECENT CAROTID STUDY AND WAS TOLD NO SIGNIFICANT BLOCKAGES  . Chronic diastolic CHF (congestive heart failure) ([place])    a. 07/2015 Ech: Ef 60-65%, Gr1 DD.  Marland Kitchen Coronary artery disease    a. 2009 CABG 4, Dr. Servando Snare (LIMA to LAD , SVG to diagonal, SVG to circumflex, SVG to PDA); b. Cath 2012 in  Recent ill,[place] patent LIMA , Patent SVG to diagonal, occluded SVG to RCA. 5x81mm BMS->native RCA; c. 09/2015 Cath: LM nl, LAD 135m, D1 100, LCX small, OM1 90/small, OM2 70, RCA 45p, patent stent, VG->dRCA 100, VG->OM1 100, VG->Diag nl, LIMA->LAD nl->Med Rx.  . Depression   . Fracture FEB 2013   FRACTURED LEFT ANKLE--NO SURGERY--WAS IN BOOT UNTIL COUPLE WEEKS AGO  . GERD (gastroesophageal reflux disease)   . High cholesterol   . Hypothyroidism   . Ischemic colitis ([place]) 2010  . Kidney stones    IN THE PAST  . 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 [place] virus    COUPLE OF  WEEKS AGO--ALL SYMTOMS RESOLVED PER PT  . Obstructive sleep apnea 2010  . Shortness of breath    AND WHEEZING AT TIMES--NO INHALERS  . Stroke ([place])    SEVERAL STROKES -TOLD SHE HAS CEREBELLUM BLOCKAGE AS RESULT OF STROKE--BUT NEUROLOGIST SAID HE DID NOT WANT TO DO ANY PROCEDURE BECAUSE OF HER AGE .  PT HAS SLIGHT LEFT FOOT DROP-DRAGS FOOT WHEN WALKING - USES WALKER  . Urinary incontinence   . UTI (lower urinary tract infection)    HX OF UTI'S-- LAST TIME COUPLE OF MONTHS AGO  . Vertigo     Patient Active Problem List   Diagnosis Date Noted  . Epistaxis 03/09/2016  . Ocular migraine 11/04/2015  . Essential hypertension 11/04/2015  . HLD (hyperlipidemia) 11/04/2015  . Coronary artery disease involving coronary bypass graft of native heart 11/04/2015  . Fatigue 10/08/2015  . UTI (urinary tract infection) 10/05/2015  . Chest pain 10/04/2015  . Hypertension 10/04/2015  . Unstable angina ([place])   . Hypertensive heart disease without heart failure   . S/P CABG 2009-cath 10/07/15-medical Rx   . S/P coronary artery stent placement-2012   . Acute CVA (cerebrovascular accident) ([place]) 08/17/2015  . Possible Renovascular hypertension 07/25/2015  . Hyperlipidemia 07/25/2015  . History of stroke-March 2017 (rt brain) 07/25/2015  . Vertebrobasilar artery stenosis 07/25/2015  . CAD (coronary artery disease) of artery bypass graft 06/06/2014  .  Diastolic dysfunction-grade 1 with EF 60% 06/06/2014  . CVA (cerebral vascular accident) ([place]) 11/17/2011  . TIA post cath 10/07/15 11/13/2011  . Weakness 11/13/2011  . OBSTRUCTIVE SLEEP APNEA 11/23/2008  . PULMONARY NODULE 11/23/2008  . Hypoxemia 11/23/2008  . Hypothyroidism 11/22/2008  . ANXIETY 11/22/2008  . STROKE 11/22/2008    Past Surgical History:  Procedure Laterality Date  . ABDOMINAL HYSTERECTOMY    . APPENDECTOMY    . [place] AND 1990  . CARDIAC CATHETERIZATION N/A 10/07/2015   Procedure: Left Heart Cath and Cors/Grafts  Angiography;  Surgeon: Peter M Martinique, MD;  Location: [facility];  Service: Cardiovascular;  Laterality: N/A;  . CATARACT EXTRACTION    . CORONARY ANGIOPLASTY  10/2010   WITH STENT PLACEMENT  . CORONARY ARTERY BYPASS GRAFT  2009  . CYSTOSCOPY WITH INJECTION  09/21/2011   Procedure: CYSTOSCOPY WITH INJECTION;  Surgeon: Ailene Rud, MD;  Location: [facility];  Service: Urology;  Laterality: N/A;  Peri Urethral Macroplastique Injection and Estring Placement  . HERNIA REPAIR    . JOINT REPLACEMENT  2007   HIP REPLACEMENT -LEFT  . TONSILLECTOMY      OB History    No data available       Home Medications    Prior to Admission medications   Medication Sig Start Date End Date Taking? Authorizing Provider  acetaminophen (TYLENOL) 500 MG tablet Take 1,000 mg by mouth every 6 (six) hours as needed for mild pain.    Historical Provider, MD  Albuterol Sulfate (PROAIR RESPICLICK) 123XX123 (90 Base) MCG/ACT AEPB Inhale 2 puffs into the lungs daily as needed (shortness of breath).    Historical Provider, MD  alendronate (FOSAMAX) 70 MG tablet Take 1 tablet (70 mg total) by mouth once a week. Take with a full glass of water on an empty stomach.mon 10/08/15   Erlene Quan, PA-C  aspirin EC 81 MG tablet Take 81 mg by mouth every evening.     Historical Provider, MD  Calcium Carb-Cholecalciferol (CALCIUM 1000 + D PO) Take 1 tablet by mouth every evening.     Historical Provider, MD  cephALEXin (KEFLEX) 250 MG capsule Take 1 capsule by mouth every evening.  10/09/15   Historical Provider, MD  clopidogrel (PLAVIX) 75 MG tablet Take 1 tablet (75 mg total) by mouth daily. 06/06/14   Mihai Croitoru, MD  CRANBERRY EXTRACT PO Take 500 mg by mouth 2 (two) times daily.     Historical Provider, MD  fish oil-omega-3 fatty acids 1000 MG capsule Take 1 g by mouth every evening.     Historical Provider, MD  isosorbide mononitrate (IMDUR) 30 MG 24 hr tablet Take 1 tablet (30 mg total) by mouth daily. 04/20/16   Almyra Deforest, PA  levothyroxine (SYNTHROID, LEVOTHROID) 75 MCG tablet Take 1 tablet (75 mcg total) by mouth daily before breakfast. 06/06/14   Mihai Croitoru, MD  losartan (COZAAR) 25 MG tablet Take 1 tablet (25 mg total) by mouth 2 (two) times daily. 04/20/16   Almyra Deforest, PA  meclizine (ANTIVERT) 25 MG tablet Take 25 mg by mouth 3 (three) times daily as needed for dizziness.    Historical Provider, MD  metoprolol succinate (TOPROL-XL) 25 MG 24 hr tablet TAKE ONE TABLET BY MOUTH DAILY 03/30/16   Mihai Croitoru, MD  Multiple Vitamin (MULITIVITAMIN WITH MINERALS) TABS Take 1 tablet by mouth daily with breakfast.    Historical Provider, MD  nitroGLYCERIN (NITROSTAT) 0.4 MG SL  tablet Place 1 tablet (0.4 mg total) under the tongue every 5 (five) minutes as needed for chest pain. 10/08/15   Erlene Quan, PA-C  ondansetron (ZOFRAN ODT) 4 MG disintegrating tablet Take one every 6 hours for nausea Patient taking differently: Take 4 mg by mouth every 6 (six) hours as needed for nausea or vomiting.  06/06/14   Mihai Croitoru, MD  OVER THE COUNTER MEDICATION Take 1 capsule by mouth 2 (two) times daily. Immuplex Capsule.Marland Kitchen    Historical Provider, MD  pantoprazole (PROTONIX) 40 MG tablet Take 1 tablet (40 mg total) by mouth daily. 06/08/16   Mihai Croitoru, MD  Polyethyl Glycol-Propyl Glycol (SYSTANE OP) Place 1 drop into both eyes 3 (three) times daily. Patient states its the Gel form. Not sure what percentage. Take every day per patient    Historical Provider, MD  rosuvastatin (CRESTOR) 20 MG tablet Take 1 tablet (20 mg total) by mouth daily. Patient taking differently: Take 20 mg by mouth every evening.  11/20/15   Mihai Croitoru, MD  sertraline (ZOLOFT) 50 MG tablet Take 1 tablet (50 mg total) by mouth daily. 04/09/16   Mihai Croitoru, MD  solifenacin (VESICARE) 5 MG tablet Take 1 tablet (5 mg total) by mouth daily. Patient taking differently: Take 5 mg by mouth every evening.  10/09/15   Rogelia Mire, NP    Family  History Family History  Problem Relation Age of Onset  . Heart disease Mother     Social History Social History  Substance Use Topics  . Smoking status: Never Smoker  . Smokeless tobacco: Never Used  . Alcohol use No     Allergies   Sulfonamide derivatives   Review of Systems Review of Systems  Constitutional: Positive for fever.  HENT: Negative.   Respiratory: Positive for cough.   Cardiovascular: Negative.   Gastrointestinal: Negative.   Musculoskeletal: Negative.   Skin: Negative.   Neurological: Negative.        Confusion  Psychiatric/Behavioral: Negative.   All other systems reviewed and are negative.    Physical Exam Updated Vital Signs BP 146/97   Pulse 76   Temp 101.6 F (38.7 C) (Rectal)   Resp 20   Ht 5\' 1"  (1.549 m)   Wt 170 lb (77.1 kg)   SpO2 92%   BMI 32.12 kg/m   Physical Exam  Constitutional: She appears well-developed and well-nourished.  HENT:  Head: Normocephalic and atraumatic.  Eyes: Conjunctivae are normal. Pupils are equal, round, and reactive to light.  Neck: Neck supple. No tracheal deviation present. No thyromegaly present.  Cardiovascular: Normal rate and regular rhythm.   No murmur heard. Pulmonary/Chest: Effort normal and breath sounds normal.  Abdominal: Soft. Bowel sounds are normal. She exhibits no distension. There is no tenderness.  Musculoskeletal: Normal range of motion. She exhibits no edema or tenderness.  Neurological: She is alert. Coordination normal.  Skin: Skin is warm and dry. No rash noted.  Psychiatric: She has a normal mood and affect.  Nursing note and vitals reviewed.    ED Treatments / Results  Labs (all labs ordered are listed, but only abnormal results are displayed) Labs Reviewed - No data to display  EKG  EKG Interpretation None       Chest x-ray viewed by me Results for orders placed or performed during the hospital encounter of 06/10/16  Urinalysis, Complete w Microscopic  Result  Value Ref Range   Color, Urine AMBER (A) YELLOW   APPearance CLEAR CLEAR  Specific Gravity, Urine 1.014 1.005 - 1.030   pH 7.0 5.0 - 8.0   Glucose, UA NEGATIVE NEGATIVE mg/dL   Hgb urine dipstick NEGATIVE NEGATIVE   Bilirubin Urine NEGATIVE NEGATIVE   Ketones, ur 5 (A) NEGATIVE mg/dL   Protein, ur NEGATIVE NEGATIVE mg/dL   Nitrite NEGATIVE NEGATIVE   Leukocytes, UA SMALL (A) NEGATIVE   RBC / HPF 0-5 0 - 5 RBC/hpf   WBC, UA TOO NUMEROUS TO COUNT 0 - 5 WBC/hpf   Bacteria, UA NONE SEEN NONE SEEN   Squamous Epithelial / LPF NONE SEEN NONE SEEN   Mucous PRESENT   CBC with Differential  Result Value Ref Range   WBC 11.6 (H) 4.0 - 10.5 K/uL   RBC 3.92 3.87 - 5.11 MIL/uL   Hemoglobin 12.1 12.0 - 15.0 g/dL   HCT 36.7 36.0 - 46.0 %   MCV 93.6 78.0 - 100.0 fL   MCH 30.9 26.0 - 34.0 pg   MCHC 33.0 30.0 - 36.0 g/dL   RDW 13.0 11.5 - 15.5 %   Platelets 233 150 - 400 K/uL   Neutrophils Relative % 75 %   Neutro Abs 8.7 (H) 1.7 - 7.7 K/uL   Lymphocytes Relative 16 %   Lymphs Abs 1.8 0.7 - 4.0 K/uL   Monocytes Relative 3 %   Monocytes Absolute 0.4 0.1 - 1.0 K/uL   Eosinophils Relative 6 %   Eosinophils Absolute 0.7 0.0 - 0.7 K/uL   Basophils Relative 0 %   Basophils Absolute 0.0 0.0 - 0.1 K/uL  Influenza panel by PCR (type A & B)  Result Value Ref Range   Influenza A By PCR NEGATIVE NEGATIVE   Influenza B By PCR NEGATIVE NEGATIVE  Basic metabolic panel  Result Value Ref Range   Sodium 132 (L) 135 - 145 mmol/L   Potassium 3.9 3.5 - 5.1 mmol/L   Chloride 96 (L) 101 - 111 mmol/L   CO2 26 22 - 32 mmol/L   Glucose, Bld 107 (H) 65 - 99 mg/dL   BUN 8 6 - 20 mg/dL   Creatinine, Ser 0.50 0.44 - 1.00 mg/dL   Calcium 9.1 8.9 - 10.3 mg/dL   GFR calc non Af Amer >60 >60 mL/min   GFR calc Af Amer >60 >60 mL/min   Anion gap 10 5 - 15  I-Stat CG4 Lactic Acid, ED  Result Value Ref Range   Lactic Acid, Venous 0.93 0.5 - 1.9 mmol/L   Dg Chest 2 View  Result Date: 06/10/2016 CLINICAL DATA:   Shortness of breath, cough, history coronary artery disease, stroke, CHF, hypertension EXAM: CHEST  2 VIEW COMPARISON:  10/04/2015 FINDINGS: Normal heart size post CABG. Atherosclerotic calcification aorta. Lungs clear. No pleural effusion or pneumothorax. Diffuse osseous demineralization. IMPRESSION: No acute abnormalities. Aortic atherosclerosis. Electronically Signed   By: Lavonia Dana M.D.   On: 06/10/2016 12:30   Radiology No results found.  Procedures Procedures (including critical care time)  Medications Ordered in ED Medications - No data to display   Initial Impression / Assessment and Plan / ED Course  I have reviewed the triage vital signs and the nursing notes.  Pertinent labs & imaging results that were available during my care of the patient were reviewed by me and considered in my medical decision making (see chart for details).  Clinical Course     Code sepsis called. Based on fever, confusion. Source of infection likely urine. IV antibiotics ordered. Hospitalist service consulted and will make arranged for  overnight stay  Final Clinical Impressions(s) / ED Diagnoses  Diagnosis acute pyelonephritis Final diagnoses:  None    New Prescriptions New Prescriptions   No medications on file     Orlie Dakin, MD 06/10/16 [place], MD 06/10/16 [phone]

## 2016-06-10 NOTE — H&P (Signed)
History and Physical    Gina Bowers [id number] DOB: July 07, 1925 DOA: 06/10/2016  PCP: Irven Shelling, MD   Patient coming from: [place] state  Chief Complaint: Confusion shortness of breath, progressive generalized weakness  HPI: Gina Bowers is a 83 y.o. female with medical history significant of coronary artery disease, status post CABG, hypertension, hyperlipidemia, hypothyroidism, who presented to the emergency department since of increasing weakness, shortness of breath, congestion, confusion. The daughter, who is present at bedside reported that patient had fever of 100.844F, at times would be more confused than usual, and these morning had an instant and off/urinary incontinence that usually happens when she has urinary tract infections. Patient was diagnosed with UTI in December again last week by her urologist in [place]. She was placed on Macrobid for Escherichia coli UTI, took it for a nearly 2 days, but symptoms have not been improving and daughter who transferred to the emergency room. Of note patient had a enterococcal UTI in May 2017 and was hospitalized in this facility  ED Course: In the emergency room her temperature was 101.44F on arrival, otherwise vital signs were stable. Blood work revealed mild leukocytosis of 11,600 and chest x-ray was negative for acute cardiopulmonary process. Patient had been tested negatively for influenza PCR  Catheterized UA revealed large amount of white blood cells. Patient was given IV Rocephin in the emergency department she denied abdominal pain, dysuria, nausea, vomiting, complained of cough, occasional pleuritic chest pain with severe coughing and runny nose.  Review of Systems: As per HPI otherwise 10 point review of systems negative.   Ambulatory Status: Ambulates independently  Past Medical History:  Diagnosis Date  . Arthritis   . Blood transfusion 2007   AFTER HIP REPLACEMENT  . Carotid bruit    PT'S DAUGHTER  STATES PT HAD RECENT CAROTID STUDY AND WAS TOLD NO SIGNIFICANT BLOCKAGES  . Chronic diastolic CHF (congestive heart failure) ([place])    a. 07/2015 Ech: Ef 60-65%, Gr1 DD.  Marland Kitchen Coronary artery disease    a. 2009 CABG 4, Dr. Servando Snare (LIMA to LAD , SVG to diagonal, SVG to circumflex, SVG to PDA); b. Cath 2012 in  Recent ill,[place] patent LIMA , Patent SVG to diagonal, occluded SVG to RCA. 5x61mm BMS->native RCA; c. 09/2015 Cath: LM nl, LAD 172m, D1 100, LCX small, OM1 90/small, OM2 70, RCA 45p, patent stent, VG->dRCA 100, VG->OM1 100, VG->Diag nl, LIMA->LAD nl->Med Rx.  . Depression   . Fracture FEB 2013   FRACTURED LEFT ANKLE--NO SURGERY--WAS IN BOOT UNTIL COUPLE WEEKS AGO  . GERD (gastroesophageal reflux disease)   . High cholesterol   . Hypothyroidism   . Ischemic colitis ([place]) 2010  . Kidney stones    IN THE PAST  . 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 [place] virus    COUPLE OF WEEKS AGO--ALL SYMTOMS RESOLVED PER PT  . Obstructive sleep apnea 2010  . Shortness of breath    AND WHEEZING AT TIMES--NO INHALERS  . Stroke ([place])    SEVERAL STROKES -TOLD SHE HAS CEREBELLUM BLOCKAGE AS RESULT OF STROKE--BUT NEUROLOGIST SAID HE DID NOT WANT TO DO ANY PROCEDURE BECAUSE OF HER AGE .  PT HAS SLIGHT LEFT FOOT DROP-DRAGS FOOT WHEN WALKING - USES WALKER  . Urinary incontinence   . UTI (lower urinary tract infection)    HX OF UTI'S-- LAST TIME COUPLE OF MONTHS AGO  . Vertigo     Past Surgical History:  Procedure Laterality Date  .  ABDOMINAL HYSTERECTOMY    . APPENDECTOMY    . [place] AND 1990  . CARDIAC CATHETERIZATION N/A 10/07/2015   Procedure: Left Heart Cath and Cors/Grafts Angiography;  Surgeon: Peter M Martinique, MD;  Location: [facility];  Service: Cardiovascular;  Laterality: N/A;  . CATARACT EXTRACTION    . CORONARY ANGIOPLASTY  10/2010   WITH STENT PLACEMENT  . CORONARY ARTERY BYPASS GRAFT  2009  . CYSTOSCOPY WITH INJECTION  09/21/2011    Procedure: CYSTOSCOPY WITH INJECTION;  Surgeon: Ailene Rud, MD;  Location: [facility];  Service: Urology;  Laterality: N/A;  Peri Urethral Macroplastique Injection and Estring Placement  . HERNIA REPAIR    . JOINT REPLACEMENT  2007   HIP REPLACEMENT -LEFT  . TONSILLECTOMY      Social History   Social History  . Marital status: Widowed    Spouse name: N/A  . Number of children: N/A  . Years of education: N/A   Occupational History  . Not on file.   Social History Main Topics  . Smoking status: Never Smoker  . Smokeless tobacco: Never Used  . Alcohol use No  . Drug use: No  . Sexual activity: Not on file   Other Topics Concern  . Not on file   Social History Narrative  . No narrative on file    Allergies  Allergen Reactions  . Sulfonamide Derivatives Hives    Family History  Problem Relation Age of Onset  . Heart disease Mother     Prior to Admission medications   Medication Sig Start Date End Date Taking? Authorizing Provider  acetaminophen (TYLENOL) 500 MG tablet Take 1,000 mg by mouth every 6 (six) hours as needed for mild pain.    Historical Provider, MD  Albuterol Sulfate (PROAIR RESPICLICK) [id number] (90 Base) MCG/ACT AEPB Inhale 2 puffs into the lungs daily as needed (shortness of breath).    Historical Provider, MD  alendronate (FOSAMAX) 70 MG tablet Take 1 tablet (70 mg total) by mouth once a week. Take with a full glass of water on an empty stomach.mon 10/08/15   Erlene Quan, PA-C  aspirin EC 81 MG tablet Take 81 mg by mouth every evening.     Historical Provider, MD  Calcium Carb-Cholecalciferol (CALCIUM 1000 + D PO) Take 1 tablet by mouth every evening.     Historical Provider, MD  cephALEXin (KEFLEX) 250 MG capsule Take 1 capsule by mouth every evening.  10/09/15   Historical Provider, MD  clopidogrel (PLAVIX) 75 MG tablet Take 1 tablet (75 mg total) by mouth daily. 06/06/14   Mihai Croitoru, MD  CRANBERRY EXTRACT PO Take 500 mg by mouth 2 (two) times  daily.     Historical Provider, MD  fish oil-omega-3 fatty acids 1000 MG capsule Take 1 g by mouth every evening.     Historical Provider, MD  isosorbide mononitrate (IMDUR) 30 MG 24 hr tablet Take 1 tablet (30 mg total) by mouth daily. 04/20/16   Almyra Deforest, PA  levothyroxine (SYNTHROID, LEVOTHROID) 75 MCG tablet Take 1 tablet (75 mcg total) by mouth daily before breakfast. 06/06/14   Mihai Croitoru, MD  losartan (COZAAR) 25 MG tablet Take 1 tablet (25 mg total) by mouth 2 (two) times daily. 04/20/16   Almyra Deforest, PA  meclizine (ANTIVERT) 25 MG tablet Take 25 mg by mouth 3 (three) times daily as needed for dizziness.    Historical Provider, MD  metoprolol succinate (TOPROL-XL) 25 MG  24 hr tablet TAKE ONE TABLET BY MOUTH DAILY 03/30/16   Sanda Klein, MD  Multiple Vitamin (MULITIVITAMIN WITH MINERALS) TABS Take 1 tablet by mouth daily with breakfast.    Historical Provider, MD  nitroGLYCERIN (NITROSTAT) 0.4 MG SL tablet Place 1 tablet (0.4 mg total) under the tongue every 5 (five) minutes as needed for chest pain. 10/08/15   Erlene Quan, PA-C  ondansetron (ZOFRAN ODT) 4 MG disintegrating tablet Take one every 6 hours for nausea Patient taking differently: Take 4 mg by mouth every 6 (six) hours as needed for nausea or vomiting.  06/06/14   Mihai Croitoru, MD  OVER THE COUNTER MEDICATION Take 1 capsule by mouth 2 (two) times daily. Immuplex Capsule.Marland Kitchen    Historical Provider, MD  pantoprazole (PROTONIX) 40 MG tablet Take 1 tablet (40 mg total) by mouth daily. 06/08/16   Mihai Croitoru, MD  Polyethyl Glycol-Propyl Glycol (SYSTANE OP) Place 1 drop into both eyes 3 (three) times daily. Patient states its the Gel form. Not sure what percentage. Take every day per patient    Historical Provider, MD  rosuvastatin (CRESTOR) 20 MG tablet Take 1 tablet (20 mg total) by mouth daily. Patient taking differently: Take 20 mg by mouth every evening.  11/20/15   Mihai Croitoru, MD  sertraline (ZOLOFT) 50 MG tablet Take 1  tablet (50 mg total) by mouth daily. 04/09/16   Mihai Croitoru, MD  solifenacin (VESICARE) 5 MG tablet Take 1 tablet (5 mg total) by mouth daily. Patient taking differently: Take 5 mg by mouth every evening.  10/09/15   Rogelia Mire, NP    Physical Exam: Vitals:   06/10/16 1145 06/10/16 1200 06/10/16 1300 06/10/16 1330  BP: 160/79 160/79 155/73   Pulse: 73 77 69 70  Resp: 16 16  20   Temp:      TempSrc:      SpO2: 95% 94% 93% 94%  Weight:      Height:         General: Appears calm and comfortable Eyes: PERRLA, EOMI, normal lids, iris ENT:  grossly normal hearing, lips & tongue, mucous membranes moist and intact Neck: no lymphoadenopathy, masses or thyromegaly Cardiovascular: RRR, no m/r/g. No JVD, carotid bruits. No LE edema.  Respiratory: bilateral faint expiratory  wheezes,coarse breath sound. Normal respiratory effort. No accessory muscle use observed Abdomen: soft, non-tender, non-distended, no organomegaly or masses appreciated. BS present in all quadrants Skin: no rash, ulcers or induration seen on limited exam Musculoskeletal: grossly normal tone BUE/BLE, good ROM, no bony abnormality or joint deformities observed Psychiatric: grossly normal mood and affect, speech fluent and appropriate, alert and oriented x3 Neurologic: CN II-XII grossly intact, moves all extremities in coordinated fashion, sensation intact  Labs on Admission: I have personally reviewed following labs and imaging studies  CBC, BMP  GFR: Estimated Creatinine Clearance: 45.7 mL/min (by C-G formula based on SCr of 0.5 mg/dL).   Creatinine Clearance: Estimated Creatinine Clearance: 45.7 mL/min (by C-G formula based on SCr of 0.5 mg/dL).    Radiological Exams on Admission: Dg Chest 2 View  Result Date: 06/10/2016 CLINICAL DATA:  Shortness of breath, cough, history coronary artery disease, stroke, CHF, hypertension EXAM: CHEST  2 VIEW COMPARISON:  10/04/2015 FINDINGS: Normal heart size post  CABG. Atherosclerotic calcification aorta. Lungs clear. No pleural effusion or pneumothorax. Diffuse osseous demineralization. IMPRESSION: No acute abnormalities. Aortic atherosclerosis. Electronically Signed   By: Lavonia Dana M.D.   On: 06/10/2016 12:30    EKG: Independently reviewed -  not ordered, telemetry showed NSR with HR 72  Assessment/Plan Principal Problem:   Urinary tract infection Active Problems:   Hypothyroidism   Obstructive sleep apnea   Cerebral artery occlusion with cerebral infarction ([place])   Hyperlipidemia   Hypertension   S/P CABG 2009-cath 10/07/15-medical Rx   Coronary artery disease involving coronary bypass graft of native heart   Urinary tract infection Continue IV Rocephin and adjust the choice of antibiotic according to the result of urine culture Monitory fever, provide antipyretics, monitor white blood cells count trend  Acute bronchitis Influenza PCR negative, we'll check respiratory viral panel, submit sputum culture Start nebulizer treatments for wheezing  CAD, status post CABG Patient is currently angina free, vital signs stable Continue aspirin, Plavix, beta blocker, Imdur and nitroglycerin when necessary  Hyperlipidemia Continue Crestor  Hypertension Blood pressure is well-controlled, continue current medications  Anxiety/depression Continue Zoloft  Hypothyroidism Continue Synthroid  DVT prophylaxis: , Lovenox  Code Status:  DO NOT RESUSCITATE  Family Communication:  Daughter Disposition Plan: MedSurg  Consults called:  none Admission status: Inpatient   York Grice, [place] Pager: [phone] [facility]  If 7PM-7AM, please contact night-coverage [email or web address] Password [email or web address]  06/10/2016, 2:08 PM

## 2016-06-10 NOTE — ED Triage Notes (Signed)
Pt from [facility] for revelation of UTI and fever that do not seem to be getting better. Pt was treated and diagnosed on Sunday with UTI and been on antibiotics since.

## 2016-06-11 ENCOUNTER — Inpatient Hospital Stay (HOSPITAL_COMMUNITY): Payer: Medicare Other

## 2016-06-11 DIAGNOSIS — R0600 Dyspnea, unspecified: Secondary | ICD-10-CM

## 2016-06-11 LAB — CBC
HEMATOCRIT: 31.3 % — AB (ref 36.0–46.0)
Hemoglobin: 10.4 g/dL — ABNORMAL LOW (ref 12.0–15.0)
MCH: 31.5 pg (ref 26.0–34.0)
MCHC: 33.2 g/dL (ref 30.0–36.0)
MCV: 94.8 fL (ref 78.0–100.0)
PLATELETS: 208 10*3/uL (ref 150–400)
RBC: 3.3 MIL/uL — ABNORMAL LOW (ref 3.87–5.11)
RDW: 13.3 % (ref 11.5–15.5)
WBC: 7.6 10*3/uL (ref 4.0–10.5)

## 2016-06-11 LAB — URINE CULTURE: CULTURE: NO GROWTH

## 2016-06-11 LAB — ECHOCARDIOGRAM COMPLETE
HEIGHTINCHES: 61 in
WEIGHTICAEL: 2720 [oz_av]

## 2016-06-11 LAB — BASIC METABOLIC PANEL
Anion gap: 8 (ref 5–15)
BUN: 11 mg/dL (ref 6–20)
CO2: 25 mmol/L (ref 22–32)
Calcium: 8.1 mg/dL — ABNORMAL LOW (ref 8.9–10.3)
Chloride: 101 mmol/L (ref 101–111)
Creatinine, Ser: 0.53 mg/dL (ref 0.44–1.00)
GFR calc Af Amer: >60 mL/min (ref >60)
Glucose, Bld: 100 mg/dL — ABNORMAL HIGH (ref 65–99)
POTASSIUM: 4.6 mmol/L (ref 3.5–5.1)
SODIUM: 134 mmol/L — AB (ref 135–145)

## 2016-06-11 MED ORDER — GUAIFENESIN-DM 100-10 MG/5ML PO SYRP
5.0000 mL | ORAL_SOLUTION | ORAL | Status: DC | PRN
  Administered 2016-06-11 – 2016-06-18 (×4): 5 mL via ORAL
  Filled 2016-06-11 (×4): qty 5

## 2016-06-11 NOTE — Progress Notes (Signed)
CRITICAL VALUE ALERT  Critical value received:  Positive for Respiratory Syncytial Virus  Date of notification:  06/10/16  Time of notification:  1955  Critical value read back:Yes.    Nurse who received alert:  Judge Stall, RN  MD notified (1st page):  Schorr  Time of first page:  2003  MD notified (2nd page):  Time of second page:  Responding MD:  N/A  Time MD responded:  N/A

## 2016-06-11 NOTE — Evaluation (Signed)
Physical Therapy Evaluation Patient Details Name: Gina Bowers MRN: [id number] DOB: 14-Jul-1925 Today's Date: 06/11/2016   History of Present Illness  Gina Bowers is a 83 y.o. female  who presents with recurrent UTIs w/ SIRS from current UTI.  Also with viral URI with pt on Droplet precautions.  Clinical Impression  Pt admitted with above diagnosis. Pt currently with functional limitations due to the deficits listed below (see PT Problem List). Pt was able to ambulate in room.  Did not take out as pt would have had to wear mask so walked laps in room.  Pt did well with ambulation in room.  Will benefit from HHPT f/u.  Daughter to line up extra aide time prn.   Pt will benefit from skilled PT to increase their independence and safety with mobility to allow discharge to the venue listed below.    Follow Up Recommendations Home health PT;Supervision - Intermittent    Equipment Recommendations  None recommended by PT    Recommendations for Other Services       Precautions / Restrictions Precautions Precautions: Fall Precaution Comments: DROPLET precautions Restrictions Weight Bearing Restrictions: No      Mobility  Bed Mobility Overal bed mobility: Needs Assistance Bed Mobility: Supine to Sit     Supine to sit: Mod assist;+2 for physical assistance     General bed mobility comments: Pt needed some help with LEs and for elevation of trunk.   Transfers Overall transfer level: Needs assistance Equipment used: Rolling walker (2 wheeled) Transfers: Sit to/from [facility] Sit to Stand: Mod assist;+2 safety/equipment Stand pivot transfers: Mod assist;+2 safety/equipment       General transfer comment: Pt with posterior lean.  Needed assist to stabilize.  Pt was able to take pivotal steps around to the 3N1 and then tried to urinate but couldn't.    Ambulation/Gait Ambulation/Gait assistance: Min assist;Min guard Ambulation Distance (Feet): 45  Feet Assistive device: Rolling walker (2 wheeled) Gait Pattern/deviations: Step-through pattern;Decreased stride length;Drifts right/left;Wide base of support;Trunk flexed   Gait velocity interpretation: Below normal speed for age/gender General Gait Details: Pt was able to take laps in room.  No LOB.  Some cues to stay close to RW. Also assist to steer around obstacles in room.    Stairs            Wheelchair Mobility    Modified Rankin (Stroke Patients Only)       Balance Overall balance assessment: Needs assistance Sitting-balance support: No upper extremity supported;Feet supported Sitting balance-Leahy Scale: Fair Sitting balance - Comments: once to EOB could sit without assist.  Postural control: Posterior lean Standing balance support: Bilateral upper extremity supported;During functional activity Standing balance-Leahy Scale: Poor Standing balance comment: relies on UE support.              High level balance activites: Backward walking;Direction changes;Turns High Level Balance Comments: Needs min assist with challenges.              Pertinent Vitals/Pain Pain Assessment: No/denies pain  O2 sats > 92% on RA therefore nurse approved leaving O2 off with pt on continuous pulse ox.    Home Living Family/patient expects to be discharged to::  (I living) Living Arrangements: Alone Available Help at Discharge: Family;Personal care attendant;Available PRN/intermittently (has aide in am and pm and can get her more prn. ) Type of Home: Independent living facility Home Access: Level entry     Home Layout: One level Home Equipment: Walker - 4  wheels;Shower seat;Grab bars - tub/shower;Grab bars - toilet;Bedside commode;Wheelchair - manual (Serta bed with rail)      Prior Function Level of Independence: Independent with assistive device(s);Needs assistance   Gait / Transfers Assistance Needed: uses rollator; walks to dining room  ADL's / Homemaking Assistance  Needed: caregiver assists with dressing and bathing. All meals and cleaning provided by ALF        Hand Dominance   Dominant Hand: Right    Extremity/Trunk Assessment   Upper Extremity Assessment Upper Extremity Assessment: Defer to OT evaluation    Lower Extremity Assessment Lower Extremity Assessment: Generalized weakness    Cervical / Trunk Assessment Cervical / Trunk Assessment: Kyphotic  Communication   Communication: No difficulties  Cognition Arousal/Alertness: Awake/alert Behavior During Therapy: WFL for tasks assessed/performed Overall Cognitive Status: Within Functional Limits for tasks assessed                      General Comments      Exercises     Assessment/Plan    PT Assessment Patient needs continued PT services  PT Problem List Decreased activity tolerance;Decreased balance;Decreased mobility;Decreased knowledge of use of DME;Decreased safety awareness;Decreased knowledge of precautions          PT Treatment Interventions DME instruction;Gait training;Functional mobility training;Therapeutic activities;Therapeutic exercise;Balance training;Patient/family education    PT Goals (Current goals can be found in the Care Plan section)  Acute Rehab PT Goals Patient Stated Goal: to go home PT Goal Formulation: With patient Time For Goal Achievement: 06/25/16 Potential to Achieve Goals: Good    Frequency Min 3X/week   Barriers to discharge        Co-evaluation               End of Session Equipment Utilized During Treatment: Gait belt Activity Tolerance: Patient limited by fatigue Patient left: in chair;with call bell/phone within reach;with family/visitor present Nurse Communication: Mobility status         Time: 1000-1032 PT Time Calculation (min) (ACUTE ONLY): 32 min   Charges:   PT Evaluation $PT Eval Moderate Complexity: 1 Procedure PT Treatments $Gait Training: 8-22 mins   PT G Codes:        Gina Bowers 06-24-2016, 12:58 PM Gina Bowers,PT Acute Rehabilitation [phone] (pager)

## 2016-06-11 NOTE — Progress Notes (Signed)
Notified Schorr patient has not voided; bladder scan showed 211cc. Orders to in and out cath if bladder scan =/> 350 or strong urge to void and unable.

## 2016-06-11 NOTE — Progress Notes (Signed)
Patient ID: Gina Bowers, female   DOB: 1926/04/22, 83 y.o.   MRN: [id number]    PROGRESS NOTE    JOHANNAH BOLHUIS  [id number] DOB: 10/22/1925 DOA: 06/10/2016  PCP: Irven Shelling, MD   Brief Narrative:  83 y.o. female with coronary artery disease, status post CABG, hypertension, hyperlipidemia, hypothyroidism, who presented to the emergency department for evaluation of increasing weakness, shortness of breath, congestion, confusion. The daughter, who was present at bedside reported that patient had fever of 100.52F, at times would be more confused than usual.  ED Course: In the emergency room, pt's temperature was 101.70F on arrival, otherwise vital signs were stable. Blood work revealed mild leukocytosis of 11,600 and chest x-ray was negative for acute cardiopulmonary process.   Assessment and Plan: Acute metabolic encephalopathy - secondary to RSV respiratory infection and UTI - pt more alert this AM - continue to provide ABX for UTI and follow up on urine cultures - continue to provide supportive care for RSV - PT eval requested   Acute viral bronchitis, RSV + - continue to provide supportive care - bronchodilators as needed - antitussives as needed   CAD, status post CABG - currently angina free, vital signs stable - Continue aspirin, Plavix, beta blocker, Imdur and nitroglycerin when necessary  Hyperlipidemia - Continue Crestor  Hypertension, essential  - Blood pressure is well-controlled, continue current medications  Anxiety/depression - Continue Zoloft  Hypothyroidism - Continue Synthroid  Obesity - Body mass index is 32.12 kg/m  DVT prophylaxis: Lovenox SQ Code Status: DNR Family Communication: Patient and daughter at bedside  Disposition Plan: To be determined   Consultants:   None  Procedures:   None   Antimicrobials:   Rocephin 1/17 -->  Subjective: No events overnight.   Objective: Vitals:   06/11/16 0200 06/11/16 0533  06/11/16 1000 06/11/16 1445  BP:  (!) 137/59 116/63 127/60  Pulse:  72 69 70  Resp:  20  18  Temp: 98.4 F (36.9 C) 97.5 F (36.4 C)  98.2 F (36.8 C)  TempSrc: Oral Oral  Tympanic  SpO2:  99%  95%  Weight:      Height:        Intake/Output Summary (Last 24 hours) at 06/11/16 1936 Last data filed at 06/11/16 1600  Gross per 24 hour  Intake              590 ml  Output              250 ml  Net              340 ml   Filed Weights   06/10/16 1109  Weight: 77.1 kg (170 lb)    Examination:  General exam: Appears calm and comfortable  Respiratory system: Clear to auscultation. Respiratory effort normal. Cardiovascular system: S1 & S2 heard, RRR. No JVD, rubs, gallops or clicks. No pedal edema. Gastrointestinal system: Abdomen is nondistended, soft and nontender. No organomegaly or masses felt.  Central nervous system: Alert and oriented. No focal neurological deficits.  Data Reviewed: I have personally reviewed following labs and imaging studies  CBC:  Recent Labs Lab 06/10/16 1202 06/10/16 1625 06/11/16 0537  WBC 11.6* 9.4 7.6  NEUTROABS 8.7*  --   --   HGB 12.1 12.0 10.4*  HCT 36.7 36.1 31.3*  MCV 93.6 93.3 94.8  PLT 233 210 [id number]   Basic Metabolic Panel:  Recent Labs Lab 06/10/16 1200 06/10/16 1625 06/11/16 0537  NA 132*  --  134*  K 3.9  --  4.6  CL 96*  --  101  CO2 26  --  25  GLUCOSE 107*  --  100*  BUN 8  --  11  CREATININE 0.50 0.53 0.53  CALCIUM 9.1  --  8.1*   Urine analysis:    Component Value Date/Time   COLORURINE AMBER (A) 06/10/2016 1150   APPEARANCEUR CLEAR 06/10/2016 1150   LABSPEC 1.014 06/10/2016 1150   PHURINE 7.0 06/10/2016 1150   GLUCOSEU NEGATIVE 06/10/2016 1150   HGBUR NEGATIVE 06/10/2016 1150   BILIRUBINUR NEGATIVE 06/10/2016 1150   KETONESUR 5 (A) 06/10/2016 1150   PROTEINUR NEGATIVE 06/10/2016 1150   UROBILINOGEN 0.2 07/12/2012 1448   NITRITE NEGATIVE 06/10/2016 1150   LEUKOCYTESUR SMALL (A) 06/10/2016 1150   Recent  Results (from the past 240 hour(s))  Blood culture (routine x 2)     Status: None (Preliminary result)   Collection Time: 06/10/16 11:45 AM  Result Value Ref Range Status   Specimen Description BLOOD RIGHT WRIST  Final   Special Requests BOTTLES DRAWN AEROBIC AND ANAEROBIC 5CC  Final   Culture NO GROWTH 1 DAY  Final   Report Status PENDING  Incomplete  Urine culture     Status: None   Collection Time: 06/10/16 11:50 AM  Result Value Ref Range Status   Specimen Description URINE, RANDOM  Final   Special Requests NONE  Final   Culture NO GROWTH  Final   Report Status 06/11/2016 FINAL  Final  Respiratory Panel by PCR     Status: Abnormal   Collection Time: 06/10/16 12:12 PM  Result Value Ref Range Status   Adenovirus NOT DETECTED NOT DETECTED Final   Coronavirus 229E NOT DETECTED NOT DETECTED Final   Coronavirus HKU1 NOT DETECTED NOT DETECTED Final   Coronavirus NL63 NOT DETECTED NOT DETECTED Final   Coronavirus OC43 NOT DETECTED NOT DETECTED Final   Metapneumovirus NOT DETECTED NOT DETECTED Final   Rhinovirus / Enterovirus NOT DETECTED NOT DETECTED Final   Influenza A NOT DETECTED NOT DETECTED Final   Influenza B NOT DETECTED NOT DETECTED Final   Parainfluenza Virus 1 NOT DETECTED NOT DETECTED Final   Parainfluenza Virus 2 NOT DETECTED NOT DETECTED Final   Parainfluenza Virus 3 NOT DETECTED NOT DETECTED Final   Parainfluenza Virus 4 NOT DETECTED NOT DETECTED Final   Respiratory Syncytial Virus DETECTED (A) NOT DETECTED Final    Comment: CRITICAL RESULT CALLED TO, READ BACK BY AND VERIFIED WITH: J.PANELO,RN 06/10/16 @1956  BY V.WILKINS    Bordetella pertussis NOT DETECTED NOT DETECTED Final   Chlamydophila pneumoniae NOT DETECTED NOT DETECTED Final   Mycoplasma pneumoniae NOT DETECTED NOT DETECTED Final      Radiology Studies: Dg Chest 2 View  Result Date: 06/10/2016 CLINICAL DATA:  Shortness of breath, cough, history coronary artery disease, stroke, CHF, hypertension EXAM:  CHEST  2 VIEW COMPARISON:  10/04/2015 FINDINGS: Normal heart size post CABG. Atherosclerotic calcification aorta. Lungs clear. No pleural effusion or pneumothorax. Diffuse osseous demineralization. IMPRESSION: No acute abnormalities. Aortic atherosclerosis. Electronically Signed   By: Lavonia Dana M.D.   On: 06/10/2016 12:30    Scheduled Meds: . aspirin EC  81 mg Oral QPM  . cefTRIAXone (ROCEPHIN)  IV  1 g Intravenous Q24H  . clopidogrel  75 mg Oral Daily  . darifenacin  7.5 mg Oral Daily  . enoxaparin (LOVENOX) injection  40 mg Subcutaneous Q24H  . isosorbide mononitrate  30 mg Oral Daily  . levothyroxine  75 mcg Oral QAC breakfast  . losartan  25 mg Oral BID  . metoprolol succinate  25 mg Oral Daily  . pantoprazole  40 mg Oral Daily  . polyvinyl alcohol  1 drop Both Eyes TID  . rosuvastatin  20 mg Oral QPM  . sertraline  50 mg Oral Daily   Continuous Infusions:   LOS: 1 day   Time spent: 20 minutes   Faye Ramsay, MD [facility] Pager [phone]  If 7PM-7AM, please contact night-coverage [email or web address] Password [facility] 06/11/2016, 7:36 PM

## 2016-06-11 NOTE — Progress Notes (Signed)
Notified Schorr at 2300 that patient's temperature was 101.3 orally. Schorr ordered 400mg  Ibuprofen PO and 500cc NS bolus over 2 hours.

## 2016-06-12 ENCOUNTER — Inpatient Hospital Stay (HOSPITAL_COMMUNITY): Payer: Medicare Other

## 2016-06-12 LAB — BASIC METABOLIC PANEL
Anion gap: 10 (ref 5–15)
BUN: 7 mg/dL (ref 6–20)
CHLORIDE: 94 mmol/L — AB (ref 101–111)
CO2: 27 mmol/L (ref 22–32)
Calcium: 8.1 mg/dL — ABNORMAL LOW (ref 8.9–10.3)
Creatinine, Ser: 0.44 mg/dL (ref 0.44–1.00)
GFR calc Af Amer: >60 mL/min (ref >60)
GFR calc non Af Amer: >60 mL/min (ref >60)
GLUCOSE: 103 mg/dL — AB (ref 65–99)
POTASSIUM: 3.8 mmol/L (ref 3.5–5.1)
Sodium: 131 mmol/L — ABNORMAL LOW (ref 135–145)

## 2016-06-12 LAB — CBC
HCT: 30.4 % — ABNORMAL LOW (ref 36.0–46.0)
HEMOGLOBIN: 10.1 g/dL — AB (ref 12.0–15.0)
MCH: 30.8 pg (ref 26.0–34.0)
MCHC: 33.2 g/dL (ref 30.0–36.0)
MCV: 92.7 fL (ref 78.0–100.0)
Platelets: 206 10*3/uL (ref 150–400)
RBC: 3.28 MIL/uL — AB (ref 3.87–5.11)
RDW: 12.9 % (ref 11.5–15.5)
WBC: 7.3 10*3/uL (ref 4.0–10.5)

## 2016-06-12 MED ORDER — IPRATROPIUM-ALBUTEROL 0.5-2.5 (3) MG/3ML IN SOLN
3.0000 mL | Freq: Three times a day (TID) | RESPIRATORY_TRACT | Status: DC
  Administered 2016-06-12 – 2016-06-18 (×17): 3 mL via RESPIRATORY_TRACT
  Filled 2016-06-12 (×18): qty 3

## 2016-06-12 MED ORDER — HYDRALAZINE HCL 20 MG/ML IJ SOLN
5.0000 mg | INTRAMUSCULAR | Status: DC | PRN
  Administered 2016-06-17 – 2016-06-19 (×3): 5 mg via INTRAVENOUS
  Filled 2016-06-12 (×3): qty 1

## 2016-06-12 MED ORDER — IPRATROPIUM-ALBUTEROL 0.5-2.5 (3) MG/3ML IN SOLN
3.0000 mL | RESPIRATORY_TRACT | Status: DC | PRN
  Administered 2016-06-12 – 2016-06-18 (×4): 3 mL via RESPIRATORY_TRACT
  Filled 2016-06-12 (×4): qty 3

## 2016-06-12 NOTE — Progress Notes (Addendum)
Patient ID: Gina Bowers, female   DOB: [birth year], 83 y.o.   MRN: [id number]    PROGRESS NOTE   Gina Bowers  [id number] DOB: 08-30-1925 DOA: 06/10/2016  PCP: Irven Shelling, MD   Brief Narrative:  83 y.o. female with coronary artery disease, status post CABG, hypertension, hyperlipidemia, hypothyroidism, who presented to the emergency department for evaluation of increasing weakness, shortness of breath, congestion, confusion. The daughter, who was present at bedside reported that patient had fever of 100.10F, at times would be more confused than usual.  ED Course: In the emergency room, pt's temperature was 101.665F on arrival, otherwise vital signs were stable. Blood work revealed mild leukocytosis of 11,600 and chest x-ray was negative for acute cardiopulmonary process.   Assessment and Plan: Acute metabolic encephalopathy - secondary to RSV respiratory infection and UTI - pt more alert this AM but still feels weak with muscle aches  - continue to provide ABX for UTI and follow up on urine cultures - continue to provide supportive care for RSV - PT eval requested, HH PT recommended   Acute viral bronchitis, RSV + - continue to provide supportive care as pt still with Tmax 102.65F - bronchodilators as needed - antitussives as needed  - pt still congested, will ask for CT chest for clearer evaluation and to rule out underlying PNA  - may need to add additional ABX if PNA recognized on CT chest   Hyponatremia - mild, encouraged poor oral intake   LE edema - trace, no signs of volume overload - ECHO done on the admission with stable EF  - monitor daily weights   CAD, status post CABG - currently angina free, vital signs stable - Continue aspirin, Plavix, beta blocker, Imdur   Hyperlipidemia - Continue Crestor  Hypertension, essential  - above target range this AM - will add hydralazine as needed   Anxiety/depression - Continue  Zoloft  Hypothyroidism - Continue Synthroid  Obesity - Body mass index is 32.12 kg/m  DVT prophylaxis: Lovenox SQ Code Status: DNR Family Communication: Patient and daughter at bedside  Disposition Plan: To be determined, likely home in 2-3 days   Consultants:   None  Procedures:   None   Antimicrobials:   Rocephin 1/17 -->  Subjective: No events overnight.   Objective: Vitals:   06/11/16 1445 06/11/16 2056 06/12/16 0100 06/12/16 0559  BP: 127/60 (!) 165/64  (!) 177/64  Pulse: 70 79  72  Resp: 18 17  17   Temp: 98.2 F (36.8 C) 100.1 F (37.8 C) (!) 102.9 F (39.4 C) 98.7 F (37.1 C)  TempSrc: Tympanic Oral Oral Oral  SpO2: 95% 97%  95%  Weight:      Height:        Intake/Output Summary (Last 24 hours) at 06/12/16 1031 Last data filed at 06/12/16 1010  Gross per 24 hour  Intake             1110 ml  Output              450 ml  Net              660 ml   Filed Weights   06/10/16 1109  Weight: 77.1 kg (170 lb)    Examination:  General exam: Appears calm and comfortable  Respiratory system: Respiratory effort normal. Scattered rhonchi bilaterally with diminished breath sounds at bases  Cardiovascular system: S1 & S2 heard, RRR. No JVD, rubs, gallops or clicks. Trace bilateral LE edema  Gastrointestinal system: Abdomen is nondistended, soft and nontender. No organomegaly or masses felt.  Central nervous system: Alert and oriented. No focal neurological deficits.  Data Reviewed: I have personally reviewed following labs and imaging studies  CBC:  Recent Labs Lab 06/10/16 1202 06/10/16 1625 06/11/16 0537 06/12/16 0543  WBC 11.6* 9.4 7.6 7.3  NEUTROABS 8.7*  --   --   --   HGB 12.1 12.0 10.4* 10.1*  HCT 36.7 36.1 31.3* 30.4*  MCV 93.6 93.3 94.8 92.7  PLT 233 210 208 [id number]   Basic Metabolic Panel:  Recent Labs Lab 06/10/16 1200 06/10/16 1625 06/11/16 0537 06/12/16 0543  NA 132*  --  134* 131*  K 3.9  --  4.6 3.8  CL 96*  --  101 94*   CO2 26  --  25 27  GLUCOSE 107*  --  100* 103*  BUN 8  --  11 7  CREATININE 0.50 0.53 0.53 0.44  CALCIUM 9.1  --  8.1* 8.1*   Urine analysis:    Component Value Date/Time   COLORURINE AMBER (A) 06/10/2016 1150   APPEARANCEUR CLEAR 06/10/2016 1150   LABSPEC 1.014 06/10/2016 1150   PHURINE 7.0 06/10/2016 1150   GLUCOSEU NEGATIVE 06/10/2016 1150   HGBUR NEGATIVE 06/10/2016 1150   BILIRUBINUR NEGATIVE 06/10/2016 1150   KETONESUR 5 (A) 06/10/2016 1150   PROTEINUR NEGATIVE 06/10/2016 1150   UROBILINOGEN 0.2 07/12/2012 1448   NITRITE NEGATIVE 06/10/2016 1150   LEUKOCYTESUR SMALL (A) 06/10/2016 1150   Recent Results (from the past 240 hour(s))  Blood culture (routine x 2)     Status: None (Preliminary result)   Collection Time: 06/10/16 11:45 AM  Result Value Ref Range Status   Specimen Description BLOOD RIGHT WRIST  Final   Special Requests BOTTLES DRAWN AEROBIC AND ANAEROBIC 5CC  Final   Culture NO GROWTH 1 DAY  Final   Report Status PENDING  Incomplete  Urine culture     Status: None   Collection Time: 06/10/16 11:50 AM  Result Value Ref Range Status   Specimen Description URINE, RANDOM  Final   Special Requests NONE  Final   Culture NO GROWTH  Final   Report Status 06/11/2016 FINAL  Final  Respiratory Panel by PCR     Status: Abnormal   Collection Time: 06/10/16 12:12 PM  Result Value Ref Range Status   Adenovirus NOT DETECTED NOT DETECTED Final   Coronavirus 229E NOT DETECTED NOT DETECTED Final   Coronavirus HKU1 NOT DETECTED NOT DETECTED Final   Coronavirus NL63 NOT DETECTED NOT DETECTED Final   Coronavirus OC43 NOT DETECTED NOT DETECTED Final   Metapneumovirus NOT DETECTED NOT DETECTED Final   Rhinovirus / Enterovirus NOT DETECTED NOT DETECTED Final   Influenza A NOT DETECTED NOT DETECTED Final   Influenza B NOT DETECTED NOT DETECTED Final   Parainfluenza Virus 1 NOT DETECTED NOT DETECTED Final   Parainfluenza Virus 2 NOT DETECTED NOT DETECTED Final   Parainfluenza  Virus 3 NOT DETECTED NOT DETECTED Final   Parainfluenza Virus 4 NOT DETECTED NOT DETECTED Final   Respiratory Syncytial Virus DETECTED (A) NOT DETECTED Final    Comment: CRITICAL RESULT CALLED TO, READ BACK BY AND VERIFIED WITH: J.PANELO,RN 06/10/16 @1956  BY V.WILKINS    Bordetella pertussis NOT DETECTED NOT DETECTED Final   Chlamydophila pneumoniae NOT DETECTED NOT DETECTED Final   Mycoplasma pneumoniae NOT DETECTED NOT DETECTED Final    Radiology Studies: Dg Chest 2 View Result Date: 06/10/2016 No acute abnormalities. Aortic  atherosclerosis.   Scheduled Meds: . aspirin EC  81 mg Oral QPM  . cefTRIAXone (ROCEPHIN)  IV  1 g Intravenous Q24H  . clopidogrel  75 mg Oral Daily  . darifenacin  7.5 mg Oral Daily  . enoxaparin (LOVENOX) injection  40 mg Subcutaneous Q24H  . isosorbide mononitrate  30 mg Oral Daily  . levothyroxine  75 mcg Oral QAC breakfast  . losartan  25 mg Oral BID  . metoprolol succinate  25 mg Oral Daily  . pantoprazole  40 mg Oral Daily  . polyvinyl alcohol  1 drop Both Eyes TID  . rosuvastatin  20 mg Oral QPM  . sertraline  50 mg Oral Daily   Continuous Infusions:   LOS: 2 days   Time spent: 20 minutes   Faye Ramsay, MD [facility] Pager [phone]  If 7PM-7AM, please contact night-coverage [email or web address] Password [email or web address] 06/12/2016, 10:31 AM

## 2016-06-12 NOTE — Care Management Important Message (Signed)
Important Message  Patient Details  Name: Gina Bowers MRN: [id number] Date of Birth: 02/08/1926   Medicare Important Message Given:  Yes Signed copy to scan   Erenest Rasher, RN 06/12/2016, 6:27 PM

## 2016-06-12 NOTE — Care Management Note (Signed)
Case Management Note  Patient Details  Name: Gina Bowers MRN: [id number] Date of Birth: 1926/02/04  Subjective/Objective:    Acute metabolic encephalopathy, RSV, acute viral bronchitis                Action/Plan: Discharge Planning: NCM spoke to pt and Rick Duff # [phone] at bedside. Offered choice for [facility]. Dtr states pt had HH in the past. She will follow up with NCM with name. States pt's [place] now have their own agency in house. Pt has RW and bedside commode. She has aide that comes in the am and pm to assist with ADL's. Family pays privately. May need oxygen at dc. Unit RN will check walking oxygen sats prior to dc to assess if oxygen needed. Will attempt to wean oxygen. Dtr has some concerns about pt having oxygen in home because pt may trip on tubing. Reports she had oxygen in the past but she was living in home with her. Will have MD put in for light weight portable ([employer]clipse) along with oxygen order if needed at dc.   PCP Lavone Orn MD  Expected Discharge Date:                 Expected Discharge Plan:  [place]  In-House Referral:  NA  Discharge planning Services  CM Consult  Post Acute Care Choice:  Home Health Choice offered to:  Adult Children  DME Arranged:  N/A DME Agency:  NA  HH Arranged:  PT, RN, OT HH Agency:     Status of Service:  In process, will continue to follow  If discussed at Long Length of Stay Meetings, dates discussed:    Additional Comments:  Erenest Rasher, RN 06/12/2016, 6:19 PM

## 2016-06-13 LAB — BASIC METABOLIC PANEL
Anion gap: 7 (ref 5–15)
CO2: 30 mmol/L (ref 22–32)
Calcium: 8.5 mg/dL — ABNORMAL LOW (ref 8.9–10.3)
Chloride: 98 mmol/L — ABNORMAL LOW (ref 101–111)
Creatinine, Ser: 0.39 mg/dL — ABNORMAL LOW (ref 0.44–1.00)
GFR calc Af Amer: >60 mL/min (ref >60)
GLUCOSE: 116 mg/dL — AB (ref 65–99)
POTASSIUM: 3.5 mmol/L (ref 3.5–5.1)
Sodium: 135 mmol/L (ref 135–145)

## 2016-06-13 LAB — CBC
HCT: 30.3 % — ABNORMAL LOW (ref 36.0–46.0)
Hemoglobin: 10.1 g/dL — ABNORMAL LOW (ref 12.0–15.0)
MCH: 30.8 pg (ref 26.0–34.0)
MCHC: 33.3 g/dL (ref 30.0–36.0)
MCV: 92.4 fL (ref 78.0–100.0)
PLATELETS: 237 10*3/uL (ref 150–400)
RBC: 3.28 MIL/uL — AB (ref 3.87–5.11)
RDW: 12.8 % (ref 11.5–15.5)
WBC: 6.7 10*3/uL (ref 4.0–10.5)

## 2016-06-13 MED ORDER — OMEGA-3-ACID ETHYL ESTERS 1 G PO CAPS
1.0000 g | ORAL_CAPSULE | Freq: Every evening | ORAL | Status: DC
  Administered 2016-06-13 – 2016-06-18 (×6): 1 g via ORAL
  Filled 2016-06-13 (×6): qty 1

## 2016-06-13 NOTE — Progress Notes (Signed)
Patient ID: Gina Bowers, female   DOB: 04/25/1926, 83 y.o.   MRN: [id number]    PROGRESS NOTE   Gina Bowers  [id number] DOB: 1925-06-24 DOA: 06/10/2016  PCP: Irven Shelling, MD   Brief Narrative:  83 y.o. female with coronary artery disease, status post CABG, hypertension, hyperlipidemia, hypothyroidism, who presented to the emergency department for evaluation of increasing weakness, shortness of breath, congestion, confusion. The daughter, who was present at bedside reported that patient had fever of 100.47F, at times would be more confused than usual.  ED Course: In the emergency room, pt's temperature was 101.33F on arrival, otherwise vital signs were stable. Blood work revealed mild leukocytosis of 11,600 and chest x-ray was negative for acute cardiopulmonary process.   Assessment and Plan: Acute metabolic encephalopathy - secondary to RSV respiratory infection and UTI - pt more alert this AM and feels better  - continue to provide ABX for UTI and follow up on urine cultures - continue to provide supportive care for RSV - PT eval requested, HH PT recommended   Acute viral bronchitis, RSV + - continue to provide supportive care, fever curve trending down  - bronchodilators as needed - antitussives as needed  - pt overall improving   Hyponatremia - mild, encouraged poor oral intake and Na is now WNL  - BMP in AM  LE edema - trace, no signs of volume overload - ECHO done on the admission with stable EF  - monitor daily weights   CAD, status post CABG - currently angina free, vital signs stable - Continue aspirin, Plavix, beta blocker, Imdur   Hyperlipidemia - Continue Crestor   Hypertension, essential  - above target range this AM - continue hydralazine as needed   Anxiety/depression - Continue Zoloft  Hypothyroidism - Continue Synthroid  Obesity - Body mass index is 32.12 kg/m  DVT prophylaxis: Lovenox SQ Code Status: DNR Family  Communication: Patient and daughter at bedside  Disposition Plan: To be determined, likely home in 1-2 days   Consultants:   None  Procedures:   None   Antimicrobials:   Rocephin 1/17 -->  Subjective: No events overnight.   Objective: Vitals:   06/12/16 2121 06/13/16 0406 06/13/16 0500 06/13/16 0905  BP:  (!) 157/71    Pulse:  75    Resp:  18    Temp:  98.4 F (36.9 C)    TempSrc:  Oral    SpO2: 98% 96%  95%  Weight:   82.1 kg (181 lb)   Height:        Intake/Output Summary (Last 24 hours) at 06/13/16 1137 Last data filed at 06/13/16 1000  Gross per 24 hour  Intake              470 ml  Output             1250 ml  Net             -780 ml   Filed Weights   06/10/16 1109 06/13/16 0500  Weight: 77.1 kg (170 lb) 82.1 kg (181 lb)    Examination:  General exam: Appears calm and comfortable  Respiratory system: Respiratory effort normal. Scattered rhonchi bilaterally with diminished breath sounds at bases  Cardiovascular system: S1 & S2 heard, RRR. No JVD, rubs, gallops or clicks. Trace bilateral LE edema  Gastrointestinal system: Abdomen is nondistended, soft and nontender. No organomegaly or masses felt.  Central nervous system: Alert and oriented. No focal neurological deficits.  Data Reviewed:  I have personally reviewed following labs and imaging studies  CBC:  Recent Labs Lab 06/10/16 1202 06/10/16 1625 06/11/16 0537 06/12/16 0543 06/13/16 0527  WBC 11.6* 9.4 7.6 7.3 6.7  NEUTROABS 8.7*  --   --   --   --   HGB 12.1 12.0 10.4* 10.1* 10.1*  HCT 36.7 36.1 31.3* 30.4* 30.3*  MCV 93.6 93.3 94.8 92.7 92.4  PLT 233 210 208 206 [id number]   Basic Metabolic Panel:  Recent Labs Lab 06/10/16 1200 06/10/16 1625 06/11/16 0537 06/12/16 0543 06/13/16 0527  NA 132*  --  134* 131* 135  K 3.9  --  4.6 3.8 3.5  CL 96*  --  101 94* 98*  CO2 26  --  25 27 30   GLUCOSE 107*  --  100* 103* 116*  BUN 8  --  11 7 <5*  CREATININE 0.50 0.53 0.53 0.44 0.39*  CALCIUM  9.1  --  8.1* 8.1* 8.5*   Urine analysis:    Component Value Date/Time   COLORURINE AMBER (A) 06/10/2016 1150   APPEARANCEUR CLEAR 06/10/2016 1150   LABSPEC 1.014 06/10/2016 1150   PHURINE 7.0 06/10/2016 1150   GLUCOSEU NEGATIVE 06/10/2016 1150   HGBUR NEGATIVE 06/10/2016 1150   BILIRUBINUR NEGATIVE 06/10/2016 1150   KETONESUR 5 (A) 06/10/2016 1150   PROTEINUR NEGATIVE 06/10/2016 1150   UROBILINOGEN 0.2 07/12/2012 1448   NITRITE NEGATIVE 06/10/2016 1150   LEUKOCYTESUR SMALL (A) 06/10/2016 1150   Recent Results (from the past 240 hour(s))  Blood culture (routine x 2)     Status: None (Preliminary result)   Collection Time: 06/10/16 11:45 AM  Result Value Ref Range Status   Specimen Description BLOOD RIGHT WRIST  Final   Special Requests BOTTLES DRAWN AEROBIC AND ANAEROBIC 5CC  Final   Culture NO GROWTH 3 DAYS  Final   Report Status PENDING  Incomplete  Urine culture     Status: None   Collection Time: 06/10/16 11:50 AM  Result Value Ref Range Status   Specimen Description URINE, RANDOM  Final   Special Requests NONE  Final   Culture NO GROWTH  Final   Report Status 06/11/2016 FINAL  Final  Respiratory Panel by PCR     Status: Abnormal   Collection Time: 06/10/16 12:12 PM  Result Value Ref Range Status   Adenovirus NOT DETECTED NOT DETECTED Final   Coronavirus 229E NOT DETECTED NOT DETECTED Final   Coronavirus HKU1 NOT DETECTED NOT DETECTED Final   Coronavirus NL63 NOT DETECTED NOT DETECTED Final   Coronavirus OC43 NOT DETECTED NOT DETECTED Final   Metapneumovirus NOT DETECTED NOT DETECTED Final   Rhinovirus / Enterovirus NOT DETECTED NOT DETECTED Final   Influenza A NOT DETECTED NOT DETECTED Final   Influenza B NOT DETECTED NOT DETECTED Final   Parainfluenza Virus 1 NOT DETECTED NOT DETECTED Final   Parainfluenza Virus 2 NOT DETECTED NOT DETECTED Final   Parainfluenza Virus 3 NOT DETECTED NOT DETECTED Final   Parainfluenza Virus 4 NOT DETECTED NOT DETECTED Final    Respiratory Syncytial Virus DETECTED (A) NOT DETECTED Final    Comment: CRITICAL RESULT CALLED TO, READ BACK BY AND VERIFIED WITH: J.PANELO,RN 06/10/16 @1956  BY V.WILKINS    Bordetella pertussis NOT DETECTED NOT DETECTED Final   Chlamydophila pneumoniae NOT DETECTED NOT DETECTED Final   Mycoplasma pneumoniae NOT DETECTED NOT DETECTED Final    Radiology Studies: Dg Chest 2 View Result Date: 06/10/2016 No acute abnormalities. Aortic atherosclerosis.   Scheduled Meds: . aspirin  EC  81 mg Oral QPM  . cefTRIAXone (ROCEPHIN)  IV  1 g Intravenous Q24H  . clopidogrel  75 mg Oral Daily  . darifenacin  7.5 mg Oral Daily  . enoxaparin (LOVENOX) injection  40 mg Subcutaneous Q24H  . ipratropium-albuterol  3 mL Nebulization TID  . isosorbide mononitrate  30 mg Oral Daily  . levothyroxine  75 mcg Oral QAC breakfast  . losartan  25 mg Oral BID  . metoprolol succinate  25 mg Oral Daily  . omega-3 acid ethyl esters  1 g Oral QPM  . pantoprazole  40 mg Oral Daily  . polyvinyl alcohol  1 drop Both Eyes TID  . rosuvastatin  20 mg Oral QPM  . sertraline  50 mg Oral Daily   Continuous Infusions:   LOS: 3 days   Time spent: 20 minutes   Faye Ramsay, MD [facility] Pager [phone]  If 7PM-7AM, please contact night-coverage [email or web address] Password [facility] 06/13/2016, 11:37 AM

## 2016-06-14 LAB — BASIC METABOLIC PANEL
ANION GAP: 9 (ref 5–15)
BUN: 6 mg/dL (ref 6–20)
CHLORIDE: 95 mmol/L — AB (ref 101–111)
CO2: 32 mmol/L (ref 22–32)
CREATININE: 0.44 mg/dL (ref 0.44–1.00)
Calcium: 8.6 mg/dL — ABNORMAL LOW (ref 8.9–10.3)
GFR calc non Af Amer: >60 mL/min (ref >60)
Glucose, Bld: 99 mg/dL (ref 65–99)
Potassium: 3.9 mmol/L (ref 3.5–5.1)
Sodium: 136 mmol/L (ref 135–145)

## 2016-06-14 LAB — CBC
HCT: 30.1 % — ABNORMAL LOW (ref 36.0–46.0)
HEMOGLOBIN: 9.9 g/dL — AB (ref 12.0–15.0)
MCH: 30.7 pg (ref 26.0–34.0)
MCHC: 32.9 g/dL (ref 30.0–36.0)
MCV: 93.2 fL (ref 78.0–100.0)
Platelets: 252 10*3/uL (ref 150–400)
RBC: 3.23 MIL/uL — ABNORMAL LOW (ref 3.87–5.11)
RDW: 12.8 % (ref 11.5–15.5)
WBC: 8.4 10*3/uL (ref 4.0–10.5)

## 2016-06-14 MED ORDER — SENNOSIDES-DOCUSATE SODIUM 8.6-50 MG PO TABS
1.0000 | ORAL_TABLET | Freq: Two times a day (BID) | ORAL | Status: DC
  Administered 2016-06-14 – 2016-06-19 (×11): 1 via ORAL
  Filled 2016-06-14 (×11): qty 1

## 2016-06-14 MED ORDER — METHYLPREDNISOLONE SODIUM SUCC 40 MG IJ SOLR
40.0000 mg | Freq: Every day | INTRAMUSCULAR | Status: DC
  Administered 2016-06-14 – 2016-06-18 (×5): 40 mg via INTRAVENOUS
  Filled 2016-06-14 (×5): qty 1

## 2016-06-14 NOTE — Progress Notes (Signed)
Patient ID: Gina Bowers, female   DOB: 08-23-1925, 83 y.o.   MRN: [id number]    PROGRESS NOTE   Gina Bowers  [id number] DOB: February 15, 1926 DOA: 06/10/2016  PCP: Irven Shelling, MD   Brief Narrative:  83 y.o. female with coronary artery disease, status post CABG, hypertension, hyperlipidemia, hypothyroidism, who presented to the emergency department for evaluation of increasing weakness, shortness of breath, congestion, confusion. The daughter, who was present at bedside reported that patient had fever of 100.58F, at times would be more confused than usual.  ED Course: In the emergency room, pt's temperature was 101.69F on arrival, otherwise vital signs were stable. Blood work revealed mild leukocytosis of 11,600 and chest x-ray was negative for acute cardiopulmonary process.   Assessment and Plan: Acute metabolic encephalopathy - secondary to RSV respiratory infection and UTI - pt more alert this AM and feels better  - continue to provide ABX for UTI and follow up on urine cultures - continue to provide supportive care for RSV - PT eval requested, HH PT recommended   Acute viral bronchitis, RSV + - continue to provide supportive care, fever curve trending down  - bronchodilators as needed as pt still wheezing - will add low dose solumedrol and see how pt does  - antitussives as needed  - pt overall improving but as noted above, still wheezing   Hyponatremia - mild, encouraged poor oral intake and Na remains WNL - BMP in AM  LE edema - trace, no signs of volume overload - ECHO done on the admission with stable EF  - monitor daily weights   CAD, status post CABG - currently angina free, vital signs stable - Continue aspirin, Plavix, beta blocker, Imdur   Hyperlipidemia - Continue Crestor   Hypertension, essential  - above target range this AM - continue hydralazine as needed   Anxiety/depression - Continue Zoloft  Hypothyroidism - Continue  Synthroid  Obesity - Body mass index is 32.12 kg/m  DVT prophylaxis: Lovenox SQ Code Status: DNR Family Communication: Patient and daughter at bedside  Disposition Plan: To be determined, likely home in 1-2 days if wheezing improves   Consultants:   None  Procedures:   None   Antimicrobials:   Rocephin 1/17 -->  Subjective: No events overnight.   Objective: Vitals:   06/13/16 2123 06/13/16 2139 06/14/16 0508 06/14/16 0908  BP:  123/74 (!) 167/70   Pulse:  73 65   Resp:  18 17   Temp:  99 F (37.2 C) 98.1 F (36.7 C)   TempSrc:  Oral Oral   SpO2: 92% 95% 92% 91%  Weight:   81.4 kg (179 lb 7.3 oz)   Height:        Intake/Output Summary (Last 24 hours) at 06/14/16 1300 Last data filed at 06/14/16 1013  Gross per 24 hour  Intake              240 ml  Output              700 ml  Net             -460 ml   Filed Weights   06/10/16 1109 06/13/16 0500 06/14/16 0508  Weight: 77.1 kg (170 lb) 82.1 kg (181 lb) 81.4 kg (179 lb 7.3 oz)    Examination:  General exam: Appears calm and comfortable  Respiratory system: Respiratory effort normal. Scattered rhonchi bilaterally, expiratory wheezing  Cardiovascular system: S1 & S2 heard, RRR. No JVD, rubs, gallops or  clicks. No edema  Gastrointestinal system: Abdomen is nondistended, soft and nontender. No organomegaly or masses felt.  Central nervous system: Alert and oriented. No focal neurological deficits.  Data Reviewed: I have personally reviewed following labs and imaging studies  CBC:  Recent Labs Lab 06/10/16 1202 06/10/16 1625 06/11/16 0537 06/12/16 0543 06/13/16 0527 06/14/16 0434  WBC 11.6* 9.4 7.6 7.3 6.7 8.4  NEUTROABS 8.7*  --   --   --   --   --   HGB 12.1 12.0 10.4* 10.1* 10.1* 9.9*  HCT 36.7 36.1 31.3* 30.4* 30.3* 30.1*  MCV 93.6 93.3 94.8 92.7 92.4 93.2  PLT 233 210 208 206 237 [id number]   Basic Metabolic Panel:  Recent Labs Lab 06/10/16 1200 06/10/16 1625 06/11/16 0537 06/12/16 0543  06/13/16 0527 06/14/16 0434  NA 132*  --  134* 131* 135 136  K 3.9  --  4.6 3.8 3.5 3.9  CL 96*  --  101 94* 98* 95*  CO2 26  --  25 27 30  32  GLUCOSE 107*  --  100* 103* 116* 99  BUN 8  --  11 7 <5* 6  CREATININE 0.50 0.53 0.53 0.44 0.39* 0.44  CALCIUM 9.1  --  8.1* 8.1* 8.5* 8.6*   Urine analysis:    Component Value Date/Time   COLORURINE AMBER (A) 06/10/2016 1150   APPEARANCEUR CLEAR 06/10/2016 1150   LABSPEC 1.014 06/10/2016 1150   PHURINE 7.0 06/10/2016 1150   GLUCOSEU NEGATIVE 06/10/2016 1150   HGBUR NEGATIVE 06/10/2016 1150   BILIRUBINUR NEGATIVE 06/10/2016 1150   KETONESUR 5 (A) 06/10/2016 1150   PROTEINUR NEGATIVE 06/10/2016 1150   UROBILINOGEN 0.2 07/12/2012 1448   NITRITE NEGATIVE 06/10/2016 1150   LEUKOCYTESUR SMALL (A) 06/10/2016 1150   Recent Results (from the past 240 hour(s))  Blood culture (routine x 2)     Status: None (Preliminary result)   Collection Time: 06/10/16 11:45 AM  Result Value Ref Range Status   Specimen Description BLOOD RIGHT WRIST  Final   Special Requests BOTTLES DRAWN AEROBIC AND ANAEROBIC 5CC  Final   Culture NO GROWTH 3 DAYS  Final   Report Status PENDING  Incomplete  Urine culture     Status: None   Collection Time: 06/10/16 11:50 AM  Result Value Ref Range Status   Specimen Description URINE, RANDOM  Final   Special Requests NONE  Final   Culture NO GROWTH  Final   Report Status 06/11/2016 FINAL  Final  Respiratory Panel by PCR     Status: Abnormal   Collection Time: 06/10/16 12:12 PM  Result Value Ref Range Status   Adenovirus NOT DETECTED NOT DETECTED Final   Coronavirus 229E NOT DETECTED NOT DETECTED Final   Coronavirus HKU1 NOT DETECTED NOT DETECTED Final   Coronavirus NL63 NOT DETECTED NOT DETECTED Final   Coronavirus OC43 NOT DETECTED NOT DETECTED Final   Metapneumovirus NOT DETECTED NOT DETECTED Final   Rhinovirus / Enterovirus NOT DETECTED NOT DETECTED Final   Influenza A NOT DETECTED NOT DETECTED Final   Influenza  B NOT DETECTED NOT DETECTED Final   Parainfluenza Virus 1 NOT DETECTED NOT DETECTED Final   Parainfluenza Virus 2 NOT DETECTED NOT DETECTED Final   Parainfluenza Virus 3 NOT DETECTED NOT DETECTED Final   Parainfluenza Virus 4 NOT DETECTED NOT DETECTED Final   Respiratory Syncytial Virus DETECTED (A) NOT DETECTED Final    Comment: CRITICAL RESULT CALLED TO, READ BACK BY AND VERIFIED WITH: J.PANELO,RN 06/10/16 @1956  BY V.WILKINS  Bordetella pertussis NOT DETECTED NOT DETECTED Final   Chlamydophila pneumoniae NOT DETECTED NOT DETECTED Final   Mycoplasma pneumoniae NOT DETECTED NOT DETECTED Final    Radiology Studies: Dg Chest 2 View Result Date: 06/10/2016 No acute abnormalities. Aortic atherosclerosis.   Scheduled Meds: . aspirin EC  81 mg Oral QPM  . cefTRIAXone (ROCEPHIN)  IV  1 g Intravenous Q24H  . clopidogrel  75 mg Oral Daily  . darifenacin  7.5 mg Oral Daily  . enoxaparin (LOVENOX) injection  40 mg Subcutaneous Q24H  . ipratropium-albuterol  3 mL Nebulization TID  . isosorbide mononitrate  30 mg Oral Daily  . levothyroxine  75 mcg Oral QAC breakfast  . losartan  25 mg Oral BID  . methylPREDNISolone (SOLU-MEDROL) injection  40 mg Intravenous Daily  . metoprolol succinate  25 mg Oral Daily  . omega-3 acid ethyl esters  1 g Oral QPM  . pantoprazole  40 mg Oral Daily  . polyvinyl alcohol  1 drop Both Eyes TID  . rosuvastatin  20 mg Oral QPM  . sertraline  50 mg Oral Daily   Continuous Infusions:   LOS: 4 days   Time spent: 20 minutes   Faye Ramsay, MD [facility] Pager [phone]  If 7PM-7AM, please contact night-coverage [email or web address] Password [email or web address] 06/14/2016, 1:00 PM

## 2016-06-15 LAB — BASIC METABOLIC PANEL
ANION GAP: 8 (ref 5–15)
BUN: 10 mg/dL (ref 6–20)
CHLORIDE: 97 mmol/L — AB (ref 101–111)
CO2: 28 mmol/L (ref 22–32)
CREATININE: 0.43 mg/dL — AB (ref 0.44–1.00)
Calcium: 8.9 mg/dL (ref 8.9–10.3)
GFR calc non Af Amer: >60 mL/min (ref >60)
Glucose, Bld: 100 mg/dL — ABNORMAL HIGH (ref 65–99)
Potassium: 3.9 mmol/L (ref 3.5–5.1)
Sodium: 133 mmol/L — ABNORMAL LOW (ref 135–145)

## 2016-06-15 LAB — CULTURE, BLOOD (ROUTINE X 2): CULTURE: NO GROWTH

## 2016-06-15 LAB — CBC
HEMATOCRIT: 30.4 % — AB (ref 36.0–46.0)
HEMOGLOBIN: 10.2 g/dL — AB (ref 12.0–15.0)
MCH: 31 pg (ref 26.0–34.0)
MCHC: 33.6 g/dL (ref 30.0–36.0)
MCV: 92.4 fL (ref 78.0–100.0)
Platelets: 255 10*3/uL (ref 150–400)
RBC: 3.29 MIL/uL — AB (ref 3.87–5.11)
RDW: 12.9 % (ref 11.5–15.5)
WBC: 9.3 10*3/uL (ref 4.0–10.5)

## 2016-06-15 MED ORDER — BISACODYL 10 MG RE SUPP
10.0000 mg | Freq: Every day | RECTAL | Status: DC | PRN
  Administered 2016-06-16: 10 mg via RECTAL
  Filled 2016-06-15: qty 1

## 2016-06-15 NOTE — Progress Notes (Signed)
Patient ID: Gina Bowers, female   DOB: 1925-08-01, 83 y.o.   MRN: [id number]    PROGRESS NOTE   GERENE LOUDERMILK  [id number] DOB: [birth year] DOA: 06/10/2016  PCP: Irven Shelling, MD   Brief Narrative:  83 y.o. female with coronary artery disease, status post CABG, hypertension, hyperlipidemia, hypothyroidism, who presented to the emergency department for evaluation of increasing weakness, shortness of breath, congestion, confusion. The daughter, who was present at bedside reported that patient had fever of 100.68F, at times would be more confused than usual.  ED Course: In the emergency room, pt's temperature was 101.18F on arrival, otherwise vital signs were stable. Blood work revealed mild leukocytosis of 11,600 and chest x-ray was negative for acute cardiopulmonary process.   Assessment and Plan: Acute metabolic encephalopathy - secondary to RSV respiratory infection and UTI - pt more alert this AM and feels better  - continue to provide ABX for UTI and follow up on urine cultures - continue to provide supportive care for RSV - PT eval requested, HH PT recommended   Acute viral bronchitis, RSV + - continue to provide supportive care, fever curve trending down  - bronchodilators as needed as pt still wheezing but this is improving  - attempt to taper off solumedrol  - antitussives as needed  - pt overall improving but as noted above, still wheezing   Hyponatremia - mild, encouraged poor oral intake and Na remains overall stable  - BMP in AM  LE edema - trace, no signs of volume overload - ECHO done on the admission with stable EF  - monitor daily weights   CAD, status post CABG - currently angina free, vital signs stable - Continue aspirin, Plavix, beta blocker, Imdur   Hyperlipidemia - Continue Crestor   Hypertension, essential  - above target range this AM - continue hydralazine as needed   Anxiety/depression - Continue Zoloft  Hypothyroidism -  Continue Synthroid  Obesity - Body mass index is 32.12 kg/m  DVT prophylaxis: Lovenox SQ Code Status: DNR Family Communication: Patient and daughter at bedside  Disposition Plan: To be determined, likely home in am  Consultants:   None  Procedures:   None   Antimicrobials:   Rocephin 1/17 -->  Subjective: No events overnight.   Objective: Vitals:   06/14/16 1302 06/14/16 2124 06/15/16 0521 06/15/16 1018  BP: (!) 133/57 (!) 160/75 (!) 169/69   Pulse: 68 69 67   Resp: 16 17    Temp: 97.5 F (36.4 C) 98 F (36.7 C) 98.2 F (36.8 C)   TempSrc: Oral Oral Oral   SpO2: 95% 98% 98% 97%  Weight:      Height:        Intake/Output Summary (Last 24 hours) at 06/15/16 1230 Last data filed at 06/15/16 0600  Gross per 24 hour  Intake               50 ml  Output                0 ml  Net               50 ml   Filed Weights   06/10/16 1109 06/13/16 0500 06/14/16 0508  Weight: 77.1 kg (170 lb) 82.1 kg (181 lb) 81.4 kg (179 lb 7.3 oz)    Examination:  General exam: Appears calm and comfortable  Respiratory system: Respiratory effort normal. Scattered rhonchi bilaterally, mild expiratory wheezing  Cardiovascular system: S1 & S2 heard, RRR. No JVD,  rubs, gallops or clicks. No edema  Gastrointestinal system: Abdomen is nondistended, soft and nontender. No organomegaly or masses felt.  Central nervous system: Alert and oriented. No focal neurological deficits.  Data Reviewed: I have personally reviewed following labs and imaging studies  CBC:  Recent Labs Lab 06/10/16 1202  06/11/16 0537 06/12/16 0543 06/13/16 0527 06/14/16 0434 06/15/16 0717  WBC 11.6*  < > 7.6 7.3 6.7 8.4 9.3  NEUTROABS 8.7*  --   --   --   --   --   --   HGB 12.1  < > 10.4* 10.1* 10.1* 9.9* 10.2*  HCT 36.7  < > 31.3* 30.4* 30.3* 30.1* 30.4*  MCV 93.6  < > 94.8 92.7 92.4 93.2 92.4  PLT 233  < > 208 206 237 252 255  < > = values in this interval not displayed. Basic Metabolic Panel:  Recent  Labs Lab 06/11/16 0537 06/12/16 0543 06/13/16 0527 06/14/16 0434 06/15/16 0717  NA 134* 131* 135 136 133*  K 4.6 3.8 3.5 3.9 3.9  CL 101 94* 98* 95* 97*  CO2 25 27 30  32 28  GLUCOSE 100* 103* 116* 99 100*  BUN 11 7 <5* 6 10  CREATININE 0.53 0.44 0.39* 0.44 0.43*  CALCIUM 8.1* 8.1* 8.5* 8.6* 8.9   Urine analysis:    Component Value Date/Time   COLORURINE AMBER (A) 06/10/2016 1150   APPEARANCEUR CLEAR 06/10/2016 1150   LABSPEC 1.014 06/10/2016 1150   PHURINE 7.0 06/10/2016 1150   GLUCOSEU NEGATIVE 06/10/2016 1150   HGBUR NEGATIVE 06/10/2016 1150   BILIRUBINUR NEGATIVE 06/10/2016 1150   KETONESUR 5 (A) 06/10/2016 1150   PROTEINUR NEGATIVE 06/10/2016 1150   UROBILINOGEN 0.2 07/12/2012 1448   NITRITE NEGATIVE 06/10/2016 1150   LEUKOCYTESUR SMALL (A) 06/10/2016 1150   Recent Results (from the past 240 hour(s))  Blood culture (routine x 2)     Status: None (Preliminary result)   Collection Time: 06/10/16 11:45 AM  Result Value Ref Range Status   Specimen Description BLOOD RIGHT WRIST  Final   Special Requests BOTTLES DRAWN AEROBIC AND ANAEROBIC 5CC  Final   Culture NO GROWTH 4 DAYS  Final   Report Status PENDING  Incomplete  Urine culture     Status: None   Collection Time: 06/10/16 11:50 AM  Result Value Ref Range Status   Specimen Description URINE, RANDOM  Final   Special Requests NONE  Final   Culture NO GROWTH  Final   Report Status 06/11/2016 FINAL  Final  Respiratory Panel by PCR     Status: Abnormal   Collection Time: 06/10/16 12:12 PM  Result Value Ref Range Status   Adenovirus NOT DETECTED NOT DETECTED Final   Coronavirus 229E NOT DETECTED NOT DETECTED Final   Coronavirus HKU1 NOT DETECTED NOT DETECTED Final   Coronavirus NL63 NOT DETECTED NOT DETECTED Final   Coronavirus OC43 NOT DETECTED NOT DETECTED Final   Metapneumovirus NOT DETECTED NOT DETECTED Final   Rhinovirus / Enterovirus NOT DETECTED NOT DETECTED Final   Influenza A NOT DETECTED NOT DETECTED  Final   Influenza B NOT DETECTED NOT DETECTED Final   Parainfluenza Virus 1 NOT DETECTED NOT DETECTED Final   Parainfluenza Virus 2 NOT DETECTED NOT DETECTED Final   Parainfluenza Virus 3 NOT DETECTED NOT DETECTED Final   Parainfluenza Virus 4 NOT DETECTED NOT DETECTED Final   Respiratory Syncytial Virus DETECTED (A) NOT DETECTED Final    Comment: CRITICAL RESULT CALLED TO, READ BACK BY AND VERIFIED  WITH: J.PANELO,RN 06/10/16 @1956  BY V.WILKINS    Bordetella pertussis NOT DETECTED NOT DETECTED Final   Chlamydophila pneumoniae NOT DETECTED NOT DETECTED Final   Mycoplasma pneumoniae NOT DETECTED NOT DETECTED Final    Radiology Studies: Dg Chest 2 View Result Date: 06/10/2016 No acute abnormalities. Aortic atherosclerosis.   Scheduled Meds: . aspirin EC  81 mg Oral QPM  . cefTRIAXone (ROCEPHIN)  IV  1 g Intravenous Q24H  . clopidogrel  75 mg Oral Daily  . darifenacin  7.5 mg Oral Daily  . enoxaparin (LOVENOX) injection  40 mg Subcutaneous Q24H  . ipratropium-albuterol  3 mL Nebulization TID  . isosorbide mononitrate  30 mg Oral Daily  . levothyroxine  75 mcg Oral QAC breakfast  . losartan  25 mg Oral BID  . methylPREDNISolone (SOLU-MEDROL) injection  40 mg Intravenous Daily  . metoprolol succinate  25 mg Oral Daily  . omega-3 acid ethyl esters  1 g Oral QPM  . pantoprazole  40 mg Oral Daily  . polyvinyl alcohol  1 drop Both Eyes TID  . rosuvastatin  20 mg Oral QPM  . senna-docusate  1 tablet Oral BID  . sertraline  50 mg Oral Daily   Continuous Infusions:   LOS: 5 days   Time spent: 20 minutes   Faye Ramsay, MD [facility] Pager [phone]  If 7PM-7AM, please contact night-coverage [email or web address] Password [facility] 06/15/2016, 12:30 PM

## 2016-06-15 NOTE — Care Management Note (Signed)
Case Management Note  Patient Details  Name: Gina Bowers MRN: [id number] Date of Birth: 11-Apr-1926  Subjective/Objective:                    Action/Plan:  Discussed discharge planning at bedside with patient and daughter Gina Bowers [id number].  At discharge patient will be staying at [facility] home which is [address], [place]   Patient already has walker, bedside commode, lift chair, bed wedge , shower seat  Nurse will check oxygen saturations to see if home oxygen needed. If so MD order needed. Expected Discharge Date:                  Expected Discharge Plan:  [place]  In-House Referral:  NA  Discharge planning Services  CM Consult  Post Acute Care Choice:  Home Health Choice offered to:  Adult Children  DME Arranged:  N/A DME Agency:  NA  HH Arranged:  PT, RN, OT, Nurse's Aide [place] Agency:  [place]  Status of Service:  Completed, signed off  If discussed at [place] of Stay Meetings, dates discussed:    Additional Comments:  Marilu Favre, RN 06/15/2016, 12:23 PM

## 2016-06-15 NOTE — Progress Notes (Signed)
Physical Therapy Treatment Patient Details Name: Gina Bowers MRN: [id number] DOB: 11-08-1925 Today's Date: 06/15/2016    History of Present Illness Gina Bowers is a 83 y.o. female  who presents with recurrent UTIs w/ SIRS from current UTI.      PT Comments    Patient is progressing toward mobility goals and demonstrated increased activity tolerance this am. Daughter present throughout session and reported pt will be staying with her for a couple of weeks prior to going back to [facility]. Stair training with daughter completed this session as daughter has steps to enter home. Pt's SpO2 between 90-95% on RA throughout session. Continue to progress as tolerated with anticipated d/c home with HHPT.   Follow Up Recommendations  Home health PT;Supervision - Intermittent     Equipment Recommendations  None recommended by PT    Recommendations for Other Services       Precautions / Restrictions Precautions Precautions: Fall Precaution Comments: droplet precaution d/c'd per RN Restrictions Weight Bearing Restrictions: No    Mobility  Bed Mobility Overal bed mobility: Needs Assistance Bed Mobility: Supine to Sit     Supine to sit: Supervision     General bed mobility comments: use of rails and HOB elevated slightly; increased time and effort  Transfers Overall transfer level: Needs assistance Equipment used: Rolling walker (2 wheeled) Transfers: Sit to/from Stand Sit to Stand: Min guard         General transfer comment: min guard for safety from EOB and BSC  Ambulation/Gait Ambulation/Gait assistance: Supervision Ambulation Distance (Feet): 200 Feet Assistive device: Rolling walker (2 wheeled) Gait Pattern/deviations: Step-through pattern;Decreased stride length;Drifts right/left;Wide base of support;Trunk flexed Gait velocity: decreased   General Gait Details: cues for proximity of RW; slow, steady gait; SpO2 90-91% with ambulation    Stairs Stairs: Yes   Stair  Management: One rail Right;Forwards;Step to pattern;Sideways Number of Stairs: 5 General stair comments: cues for step to pattern and descending sideways; practiced with R hand rail to simulate daughter's home; daughter assisted with ascending forward  Wheelchair Mobility    Modified Rankin (Stroke Patients Only)       Balance Overall balance assessment: Needs assistance Sitting-balance support: No upper extremity supported;Feet supported Sitting balance-Leahy Scale: Fair       Standing balance-Leahy Scale: Fair Standing balance comment: pt able to assist with pulling up underward without UE support                    Cognition Arousal/Alertness: Awake/alert Behavior During Therapy: WFL for tasks assessed/performed Overall Cognitive Status: Within Functional Limits for tasks assessed                      Exercises      General Comments General comments (skin integrity, edema, etc.): SpO2 remainsed between 90-95% on RA throughout session; dauther present and actively participated      Pertinent Vitals/Pain Pain Assessment: No/denies pain    Home Living                      Prior Function            PT Goals (current goals can now be found in the care plan section) Acute Rehab PT Goals Patient Stated Goal: to go home PT Goal Formulation: With patient Time For Goal Achievement: 06/25/16 Potential to Achieve Goals: Good Progress towards PT goals: Progressing toward goals    Frequency    Min 3X/week  PT Plan Current plan remains appropriate    Co-evaluation             End of Session Equipment Utilized During Treatment: Gait belt Activity Tolerance: Patient tolerated treatment well Patient left: in chair;with call bell/phone within reach;with family/visitor present     Time: 1131-1208 PT Time Calculation (min) (ACUTE ONLY): 37 min  Charges:  $Gait Training: 8-22 mins $Therapeutic Activity: 8-22 mins                     G Codes:      Salina April, PTA Pager: [phone]   06/15/2016, 12:35 PM

## 2016-06-15 NOTE — Clinical Social Work Note (Signed)
CSW consulted as pt is from a facility. Pt is from [facility], therefore CSW does not need to follow. RNCM is following for discharge planning needs. CSW is signing off as no further needs identified.   Darden Dates, MSW, LCSW  Clinical Social Worker  [phone]

## 2016-06-16 LAB — URINALYSIS, ROUTINE W REFLEX MICROSCOPIC
BILIRUBIN URINE: NEGATIVE
Glucose, UA: NEGATIVE mg/dL
Hgb urine dipstick: NEGATIVE
Ketones, ur: NEGATIVE mg/dL
NITRITE: NEGATIVE
PROTEIN: NEGATIVE mg/dL
Specific Gravity, Urine: 1.006 (ref 1.005–1.030)
pH: 7 (ref 5.0–8.0)

## 2016-06-16 LAB — BASIC METABOLIC PANEL
ANION GAP: 7 (ref 5–15)
BUN: 15 mg/dL (ref 6–20)
CHLORIDE: 97 mmol/L — AB (ref 101–111)
CO2: 29 mmol/L (ref 22–32)
CREATININE: 0.43 mg/dL — AB (ref 0.44–1.00)
Calcium: 8.9 mg/dL (ref 8.9–10.3)
GFR calc non Af Amer: >60 mL/min (ref >60)
Glucose, Bld: 96 mg/dL (ref 65–99)
POTASSIUM: 4.3 mmol/L (ref 3.5–5.1)
Sodium: 133 mmol/L — ABNORMAL LOW (ref 135–145)

## 2016-06-16 LAB — CBC
HEMATOCRIT: 30.5 % — AB (ref 36.0–46.0)
HEMOGLOBIN: 10.3 g/dL — AB (ref 12.0–15.0)
MCH: 31.1 pg (ref 26.0–34.0)
MCHC: 33.8 g/dL (ref 30.0–36.0)
MCV: 92.1 fL (ref 78.0–100.0)
Platelets: 297 10*3/uL (ref 150–400)
RBC: 3.31 MIL/uL — ABNORMAL LOW (ref 3.87–5.11)
RDW: 12.7 % (ref 11.5–15.5)
WBC: 9.9 10*3/uL (ref 4.0–10.5)

## 2016-06-16 MED ORDER — IPRATROPIUM-ALBUTEROL 0.5-2.5 (3) MG/3ML IN SOLN: 3.0000 mL | RESPIRATORY_TRACT | 1 refills | Status: DC | PRN

## 2016-06-16 MED ORDER — PREDNISONE 10 MG PO TABS: ORAL_TABLET | ORAL | 0 refills | Status: DC

## 2016-06-16 MED ORDER — SODIUM CHLORIDE 0.9 % IV SOLN
3.0000 g | Freq: Three times a day (TID) | INTRAVENOUS | Status: DC
  Administered 2016-06-16 – 2016-06-19 (×9): 3 g via INTRAVENOUS
  Filled 2016-06-16 (×11): qty 3

## 2016-06-16 NOTE — Discharge Summary (Signed)
Physician Discharge Summary  Gina Bowers [id number] DOB: 1925/10/17 DOA: 06/10/2016  PCP: Irven Shelling, MD  Admit date: 06/10/2016 Discharge date: 06/17/2016  Recommendations for Outpatient Follow-up:  1. Pt will need to follow up with PCP in 2-3 weeks post discharge 2. Please obtain BMP to evaluate electrolytes and kidney function   Discharge Diagnoses:  Principal Problem:   Urinary tract infection Active Problems:   Hypothyroidism   Obstructive sleep apnea   Cerebral artery occlusion with cerebral infarction [facility])   Discharge Condition: Stable  Diet recommendation: Heart healthy diet discussed in details   Brief Narrative:  83 y.o.femalewith coronary artery disease, status post CABG, hypertension, hyperlipidemia, hypothyroidism, who presented to the emergency department for evaluation of increasing weakness, shortness of breath, congestion, confusion. The daughter, who was present at bedside reported that patient had fever of 100.33F, at times would be more confused than usual.  ED Course:In the emergency room, pt's temperature was 101.54F on arrival, otherwise vital signs were stable. Blood work revealed mild leukocytosis of 11,600 and chest x-ray was negative for acute cardiopulmonary process.   Assessment and Plan: Acute metabolic encephalopathy - secondary to RSV respiratory infection and UTI - pt more alert this AM and feels better  - continue to provide ABX for UTI and follow up on urine cultures - continue to provide supportive care for RSV - PT eval requested, HH PT recommended   Acute viral bronchitis, RSV + - continue to provide supportive care, fever curve trending down  - bronchodilators as needed  - antitussives as needed  - pt overall improving, taper down steroids   Hyponatremia - mild, encouraged poor oral intake and Na remains overall stable   LE edema - trace, no signs of volume overload - ECHO done on the admission with  stable EF   CAD, status post CABG  - currently angina free, vital signs stable - Continue aspirin, Plavix, beta blocker, Imdur   Hyperlipidemia - Continue Crestor   Hypertension, essential  - fluctuating, continue home medical regimen   Anxiety/depression - Continue Zoloft  Hypothyroidism - Continue Synthroid  Obesity - Body mass index is 32.12 kg/m  DVT prophylaxis: Lovenox SQ Code Status: DNR Family Communication: Patient and daughter at bedside  Disposition Plan: Home in AM  Consultants:   None  Procedures:   None   Antimicrobials:   Rocephin 1/17 --> 1/23  Unasyn 1/23 -->   Procedures/Studies: Dg Chest 2 View  Result Date: 06/10/2016 CLINICAL DATA:  Shortness of breath, cough, history coronary artery disease, stroke, CHF, hypertension EXAM: CHEST  2 VIEW COMPARISON:  10/04/2015 FINDINGS: Normal heart size post CABG. Atherosclerotic calcification aorta. Lungs clear. No pleural effusion or pneumothorax. Diffuse osseous demineralization. IMPRESSION: No acute abnormalities. Aortic atherosclerosis. Electronically Signed   By: Lavonia Dana M.D.   On: 06/10/2016 12:30   Ct Chest Wo Contrast  Result Date: 06/13/2016 CLINICAL DATA:  83 y/o  F; dyspnea. EXAM: CT CHEST WITHOUT CONTRAST TECHNIQUE: Multidetector CT imaging of the chest was performed following the standard protocol without IV contrast. COMPARISON:  06/10/2016 chest radiograph. FINDINGS: Cardiovascular: Normal heart size. No pericardial effusion. Severe coronary artery calcifications. Aortic valvular calcification associated with aortic stenosis. Ascending thoracic aorta measures 40 mm. Aortic atherosclerosis with mild calcification. Mediastinum/Nodes: No lymphadenopathy.  Large hiatal hernia. Lungs/Pleura: Left upper level punctate calcified granuloma. Peripheral reticular markings with lung base predominance compatible with mild pulmonary fibrosis. No honeycombing. No pleural effusion. No  consolidation. Mild bronchiectatic changes at the lung bases  and scattered mucous plugging. Upper Abdomen: Aortic atherosclerosis. Left upper pole renal cyst measuring 4.6 cm. Musculoskeletal: Healed median sternotomy. T10 vertebral body mild compression deformity post kyphoplasty. Right third anterior subacute rib fracture with callus. IMPRESSION: 1. Severe coronary artery calcification. 2. Aortic valvular calcification associated with aortic stenosis. 3. 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 Recommend annual imaging followup by CTA or MRA. This recommendation follows 2010 ACCF/AHA/AATS/ACR/ASA/SCA/SCAI/SIR/STS/SVM Guidelines for the Diagnosis and Management of Patients with Thoracic Aortic Disease. Circulation. 2010; 121: [id number] 4. Mild aortic atherosclerosis. 5. Mild pulmonary fibrosis. Mild basilar bronchiectasis. No honeycombing. Electronically Signed   By: Kristine Garbe M.D.   On: 06/13/2016 04:00    Discharge Exam: Vitals:   06/16/16 0608 06/16/16 0848  BP: (!) 168/71 (!) 180/63  Pulse: 62 70  Resp: 17 18  Temp: 98.2 F (36.8 C) 97.7 F (36.5 C)   Vitals:   06/15/16 2130 06/16/16 0608 06/16/16 0821 06/16/16 0848  BP: 125/72 (!) 168/71  (!) 180/63  Pulse: 76 62  70  Resp: 17 17  18   Temp: 98.5 F (36.9 C) 98.2 F (36.8 C)  97.7 F (36.5 C)  TempSrc: Oral Oral  Oral  SpO2: 92% 93% 92% 97%  Weight:  80.9 kg (178 lb 5.6 oz)    Height:        General: Pt is alert, follows commands appropriately, not in acute distress Cardiovascular: Regular rate and rhythm, S1/S2 +, no murmurs, no rubs, no gallops Respiratory: Clear to auscultation bilaterally, no wheezing, no crackles, no rhonchi Abdominal: Soft, non tender, non distended, bowel sounds +, no guarding Extremities: no edema, no cyanosis, pulses palpable bilaterally DP and PT  Discharge Instructions   Allergies as of 06/16/2016      Reactions   Sulfa Antibiotics Hives   Sulfonamide Derivatives Hives       Medication List    STOP taking these medications   nitrofurantoin (macrocrystal-monohydrate) 100 MG capsule Commonly known as:  MACROBID     TAKE these medications   acetaminophen 500 MG tablet Commonly known as:  TYLENOL Take 1,000 mg by mouth every 6 (six) hours as needed for mild pain or fever.   alendronate 70 MG tablet Commonly known as:  FOSAMAX Take 1 tablet (70 mg total) by mouth once a week. Take with a full glass of water on an empty stomach.mon What changed:  when to take this  additional instructions   aspirin EC 81 MG tablet Take 81 mg by mouth every evening.   CALCIUM 1000 + D PO Take 1 tablet by mouth every evening.   cephALEXin 250 MG capsule Commonly known as:  KEFLEX Take 1 capsule by mouth every evening.   clopidogrel 75 MG tablet Commonly known as:  PLAVIX Take 1 tablet (75 mg total) by mouth daily.   CRANBERRY EXTRACT PO Take 500 mg by mouth 2 (two) times daily.   fish oil-omega-3 fatty acids 1000 MG capsule Take 1 g by mouth every evening.   ipratropium-albuterol 0.5-2.5 (3) MG/3ML Soln Commonly known as:  DUONEB Take 3 mLs by nebulization every 4 (four) hours as needed.   isosorbide mononitrate 30 MG 24 hr tablet Commonly known as:  IMDUR Take 1 tablet (30 mg total) by mouth daily. What changed:  how much to take  when to take this   levothyroxine 75 MCG tablet Commonly known as:  SYNTHROID, LEVOTHROID Take 1 tablet (75 mcg total) by mouth daily before breakfast.   losartan 25 MG tablet Commonly known  as:  COZAAR Take 1 tablet (25 mg total) by mouth 2 (two) times daily.   meclizine 25 MG tablet Commonly known as:  ANTIVERT Take 25 mg by mouth 3 (three) times daily as needed for dizziness.   metoprolol succinate 25 MG 24 hr tablet Commonly known as:  TOPROL-XL TAKE ONE TABLET BY MOUTH DAILY   multivitamin with minerals Tabs tablet Take 1 tablet by mouth daily with breakfast.   nitroGLYCERIN 0.4 MG SL tablet Commonly  known as:  NITROSTAT Place 1 tablet (0.4 mg total) under the tongue every 5 (five) minutes as needed for chest pain.   ondansetron 4 MG disintegrating tablet Commonly known as:  ZOFRAN ODT Take one every 6 hours for nausea What changed:  how much to take  how to take this  when to take this  reasons to take this  additional instructions   OVER THE COUNTER MEDICATION See admin instructions. Immuplex Capsule: Take 1 capsule by mouth 2 times a day   pantoprazole 40 MG tablet Commonly known as:  PROTONIX Take 1 tablet (40 mg total) by mouth daily.   predniSONE 10 MG tablet Commonly known as:  DELTASONE Take 50 mg tablet on 1/24 and taper down by 10 mg daily until completed   PROAIR RESPICLICK 123XX123 (90 Base) MCG/ACT Aepb Generic drug:  Albuterol Sulfate Inhale 2 puffs into the lungs daily as needed (shortness of breath).   Probiotic Caps Take 1 capsule by mouth daily with breakfast.   rosuvastatin 20 MG tablet Commonly known as:  CRESTOR Take 1 tablet (20 mg total) by mouth daily. What changed:  when to take this   sertraline 50 MG tablet Commonly known as:  ZOLOFT Take 1 tablet (50 mg total) by mouth daily.   solifenacin 5 MG tablet Commonly known as:  VESICARE Take 1 tablet (5 mg total) by mouth daily. What changed:  when to take this   SYSTANE OP Place 1 drop into both eyes 3 (three) times daily. Patient states its the Gel form (soothes)            Durable Medical Equipment        Start     Ordered   06/15/16 1233  For home use only DME Nebulizer machine  Once    Question:  Patient needs a nebulizer to treat with the following condition  Answer:  PNA (pneumonia)   06/15/16 1233     Follow-up Information    Irven Shelling, MD Follow up.   Specialty:  Internal Medicine Contact information: [address] [place] [address] [phone]          The results of significant diagnostics from this hospitalization (including  imaging, microbiology, ancillary and laboratory) are listed below for reference.     Microbiology: Recent Results (from the past 240 hour(s))  Blood culture (routine x 2)     Status: None   Collection Time: 06/10/16 11:45 AM  Result Value Ref Range Status   Specimen Description BLOOD RIGHT WRIST  Final   Special Requests BOTTLES DRAWN AEROBIC AND ANAEROBIC 5CC  Final   Culture NO GROWTH 5 DAYS  Final   Report Status 06/15/2016 FINAL  Final  Urine culture     Status: None   Collection Time: 06/10/16 11:50 AM  Result Value Ref Range Status   Specimen Description URINE, RANDOM  Final   Special Requests NONE  Final   Culture NO GROWTH  Final   Report Status 06/11/2016 FINAL  Final  Respiratory Panel by PCR     Status: Abnormal   Collection Time: 06/10/16 12:12 PM  Result Value Ref Range Status   Adenovirus NOT DETECTED NOT DETECTED Final   Coronavirus 229E NOT DETECTED NOT DETECTED Final   Coronavirus HKU1 NOT DETECTED NOT DETECTED Final   Coronavirus NL63 NOT DETECTED NOT DETECTED Final   Coronavirus OC43 NOT DETECTED NOT DETECTED Final   Metapneumovirus NOT DETECTED NOT DETECTED Final   Rhinovirus / Enterovirus NOT DETECTED NOT DETECTED Final   Influenza A NOT DETECTED NOT DETECTED Final   Influenza B NOT DETECTED NOT DETECTED Final   Parainfluenza Virus 1 NOT DETECTED NOT DETECTED Final   Parainfluenza Virus 2 NOT DETECTED NOT DETECTED Final   Parainfluenza Virus 3 NOT DETECTED NOT DETECTED Final   Parainfluenza Virus 4 NOT DETECTED NOT DETECTED Final   Respiratory Syncytial Virus DETECTED (A) NOT DETECTED Final    Comment: CRITICAL RESULT CALLED TO, READ BACK BY AND VERIFIED WITH: J.PANELO,RN 06/10/16 @1956  BY V.WILKINS    Bordetella pertussis NOT DETECTED NOT DETECTED Final   Chlamydophila pneumoniae NOT DETECTED NOT DETECTED Final   Mycoplasma pneumoniae NOT DETECTED NOT DETECTED Final     Labs: Basic Metabolic Panel:  Recent Labs Lab 06/12/16 0543 06/13/16 0527  06/14/16 0434 06/15/16 0717 06/16/16 0547  NA 131* 135 136 133* 133*  K 3.8 3.5 3.9 3.9 4.3  CL 94* 98* 95* 97* 97*  CO2 27 30 32 28 29  GLUCOSE 103* 116* 99 100* 96  BUN 7 <5* 6 10 15   CREATININE 0.44 0.39* 0.44 0.43* 0.43*  CALCIUM 8.1* 8.5* 8.6* 8.9 8.9   CBC:  Recent Labs Lab 06/10/16 1202  06/12/16 0543 06/13/16 0527 06/14/16 0434 06/15/16 0717 06/16/16 0547  WBC 11.6*  < > 7.3 6.7 8.4 9.3 9.9  NEUTROABS 8.7*  --   --   --   --   --   --   HGB 12.1  < > 10.1* 10.1* 9.9* 10.2* 10.3*  HCT 36.7  < > 30.4* 30.3* 30.1* 30.4* 30.5*  MCV 93.6  < > 92.7 92.4 93.2 92.4 92.1  PLT 233  < > 206 237 252 255 297  < > = values in this interval not displayed.  SIGNED: Time coordinating discharge:  30 minutes  Faye Ramsay, MD  [facility] 06/16/2016, 9:10 AM Pager [phone]  If 7PM-7AM, please contact night-coverage [email or web address] Password [email or web address]

## 2016-06-16 NOTE — Care Management Important Message (Signed)
Important Message  Patient Details  Name: Gina Bowers MRN: [id number] Date of Birth: [birth year]   Medicare Important Message Given:  Yes    [name] Montine Circle 06/16/2016, 12:04 PM

## 2016-06-16 NOTE — Progress Notes (Addendum)
Pharmacy Antibiotic Note  MAJESTI DUHN is a 83 y.o. female admitted on 06/10/2016 with UTI.  Pharmacy has been consulted for Unasyn dosing.  Pt with UTI symptoms & pt feeling worse, and UA looks a bit worse than on admit.  Pt initially treated with Ceftriaxone for [facility] UTI (from outpt cx) on admit and now changing to Unasyn.  Hx Enterococcal UTI 09/2015, s: Ampicillin and r: Levofloxacin.   Plan: Unasyn 3g IV q8 F/U culture results  Height: 5\' 1"  (154.9 cm) Weight: 178 lb 5.6 oz (80.9 kg) IBW/kg (Calculated) : 47.8  Temp (24hrs), Avg:98.2 F (36.8 C), Min:97.7 F (36.5 C), Max:98.5 F (36.9 C)   Recent Labs Lab 06/10/16 1208  06/12/16 0543 06/13/16 0527 06/14/16 0434 06/15/16 0717 06/16/16 0547  WBC  --   < > 7.3 6.7 8.4 9.3 9.9  CREATININE  --   < > 0.44 0.39* 0.44 0.43* 0.43*  LATICACIDVEN 0.93  --   --   --   --   --   --   < > = values in this interval not displayed.  Estimated Creatinine Clearance: 46.8 mL/min (by C-G formula based on SCr of 0.43 mg/dL (L)).    Allergies  Allergen Reactions  . Sulfa Antibiotics Hives  . Sulfonamide Derivatives Hives    Antimicrobials this admission:  1/17-1/23 Ceftriaxone >> 1/23 Unasyn >>   Microbiology results:  1/17 UCx neg 1/17 Blood neg 1/17 Resp panel (+)RSV 1/23 UCx:    Thank you for allowing pharmacy to be a part of this patient's care.   Gracy Bruins, PharmD Clinical Pharmacist [facility]

## 2016-06-16 NOTE — Discharge Instructions (Signed)
Respiratory Syncytial Virus, Adult Respiratory syncytial virus (RSV) is a common viral infection. It is caused by a virus that is similar to viruses that cause cold and flu symptoms. RSV can affect the nose, throat, and upper air passages (upper respiratory system) and the windpipe and lungs (lower respiratory system). If RSV affects the air passages in your lungs, you have bronchiolitis. If RSV affects your lungs, you have pneumonia. RSV spreads from person to person (is contagious) through droplets from coughs and sneezes (respiratory secretions). RSV is rarely serious when it occurs in adults. What are the causes? An RSV infection is caused by the respiratory syncytial virus. When a sick person coughs or sneezes, there are particles of the virus in the droplets. You can get sick if you breathe in (inhale) those droplets. The virus can also survive for a while in droplets that land on surfaces. If you touch a contaminated surface and then touch your face, the virus can enter your body through your mouth, nose, or eyes. The virus also spreads through kissing, close contact, and shared eating or drinking utensils. What increases the risk? You may have a higher risk for RSV infection if:  You are 5 or older.  You have a long-term (chronic) lung condition, such as COPD.  You have a weakened disease-fighting system (immune system).  You have Down syndrome.  You have heart disease.  You work in a hospital or other health care facility.  You live in a long-term health care facility. What are the signs or symptoms? Symptoms of RSV include:  Runny nose.  Coughing. You may have a cough that brings up mucus (productive cough).  Sneezing.  Fever.  Decreased appetite.  Breathing loudly (wheezing).  Shortness of breath.  Fluid buildup in the lungs (respiratory distress). How is this diagnosed? This condition may be diagnosed based on:  Your symptoms.  Your medical history.  A  physical exam.  A chest X-ray to rule out pneumonia.  Blood tests or tests of mucus from your lungs (sputum). These tests may be done if you are older.  A test of your respiratory secretions. How is this treated? In most cases, the RSV infection will go away after 1-2 weeks of caring for yourself at home. If you have severe RSV and you develop pneumonia, you may need to be treated in the hospital with oxygen, antibiotic medicine, and medicines to open your breathing tubes (bronchodilators). Follow these instructions at home:  Take over-the-counter and prescription medicines only as told by your health care provider.  Drink enough fluid to keep your urine clear or pale yellow.  Rest at home until your symptoms go away.  Eat a healthy diet.  Do not use any products that contain nicotine or tobacco, such as cigarettes and e-cigarettes. If you need help quitting, ask your health care provider.  Keep all follow-up visits as told by your health care provider. This is important. How is this prevented? To prevent catching and spreading the RSV virus:  Wash your hands often with soap and water. If soap and water are not available, use an alcohol-based hand sanitizer. If you have not cleaned your hands, do not touch your face.  If you have cold-like or flu-like symptoms, stay home.  Cover your nose and mouth when you cough or sneeze.  Avoid large groups of people.  Keep a safe distance from people who are coughing and sneezing. Contact a health care provider if:  Your symptoms get worse.  Your  symptoms have not improved after 2 weeks.  You have a fever.  You have continuing (persistent) sweatiness, hot flashes, or chills.  You cough up much more mucus than usual.  You cough up blood.  You feel very tired (are lethargic).  You become confused.  You have respiratory distress that gets worse. This information is not intended to replace advice given to you by your health care  provider. Make sure you discuss any questions you have with your health care provider. Document Released: 10/22/2015 Document Revised: 11/29/2015 Document Reviewed: 10/22/2015 Elsevier Interactive Patient Education  2017 [employer].

## 2016-06-17 NOTE — Progress Notes (Signed)
Awaiting urine cultures before we can d/c home. D/C summary prepared 1/23.   Faye Ramsay, MD  [facility] Pager [phone]  If 7PM-7AM, please contact night-coverage [email or web address] Password [email or web address]

## 2016-06-17 NOTE — Progress Notes (Signed)
Physical Therapy Treatment Patient Details Name: Gina Bowers MRN: [id number] DOB: [birth year] Today's Date: 06/17/2016    History of Present Illness Gina Bowers is a 83 y.o. female  who presents with recurrent UTIs w/ SIRS from current UTI.      PT Comments    Pt increasing her ambulation distance to 220 ft with rw and supervision. SpO2 92% sitting and increasing to 97% during ambulation on RA. Pt is progressing with her mobility, anticipate D/C to home following acute stay. Daughter present and supportive throughout session.   Follow Up Recommendations  Home health PT;Supervision - Intermittent     Equipment Recommendations  None recommended by PT    Recommendations for Other Services       Precautions / Restrictions Precautions Precautions: Fall Restrictions Weight Bearing Restrictions: No    Mobility  Bed Mobility Overal bed mobility: Needs Assistance Bed Mobility: Supine to Sit     Supine to sit: Supervision        Transfers Overall transfer level: Needs assistance Equipment used: Rolling walker (2 wheeled) Transfers: Sit to/from Stand Sit to Stand: Min guard         General transfer comment: cues for hand placement  Ambulation/Gait Ambulation/Gait assistance: Supervision Ambulation Distance (Feet): 220 Feet Assistive device: Rolling walker (2 wheeled) Gait Pattern/deviations: Step-through pattern;Decreased step length - right;Decreased step length - left;Trunk flexed Gait velocity: decreased   General Gait Details: SpO2- 92% sitting, increasing to 97% during ambulation on RA.    Stairs            Wheelchair Mobility    Modified Rankin (Stroke Patients Only)       Balance Overall balance assessment: Needs assistance Sitting-balance support: No upper extremity supported;Feet supported Sitting balance-Leahy Scale: Good     Standing balance support: Bilateral upper extremity supported Standing balance-Leahy Scale: Poor Standing  balance comment: using rw                    Cognition Arousal/Alertness: Awake/alert Behavior During Therapy: WFL for tasks assessed/performed Overall Cognitive Status: Within Functional Limits for tasks assessed                      Exercises      General Comments        Pertinent Vitals/Pain Pain Assessment: No/denies pain    Home Living                      Prior Function            PT Goals (current goals can now be found in the care plan section) Acute Rehab PT Goals Patient Stated Goal: to go home PT Goal Formulation: With patient Time For Goal Achievement: 06/25/16 Potential to Achieve Goals: Good Progress towards PT goals: Progressing toward goals    Frequency    Min 3X/week      PT Plan Current plan remains appropriate    Co-evaluation             End of Session Equipment Utilized During Treatment: Gait belt Activity Tolerance: Patient tolerated treatment well Patient left: in chair;with call bell/phone within reach;with family/visitor present     Time: 1500-1520 PT Time Calculation (min) (ACUTE ONLY): 20 min  Charges:  $Gait Training: 8-22 mins                    G Codes:      Cassell Clement, PT,  CSCS Pager [phone] Office [phone]  06/17/2016, 4:25 PM

## 2016-06-18 LAB — BASIC METABOLIC PANEL
Anion gap: 11 (ref 5–15)
BUN: 15 mg/dL (ref 6–20)
CHLORIDE: 97 mmol/L — AB (ref 101–111)
CO2: 26 mmol/L (ref 22–32)
Calcium: 9.1 mg/dL (ref 8.9–10.3)
Creatinine, Ser: 0.55 mg/dL (ref 0.44–1.00)
GFR calc Af Amer: >60 mL/min (ref >60)
GLUCOSE: 97 mg/dL (ref 65–99)
Potassium: 3.6 mmol/L (ref 3.5–5.1)
Sodium: 134 mmol/L — ABNORMAL LOW (ref 135–145)

## 2016-06-18 LAB — CBC
HEMATOCRIT: 32.8 % — AB (ref 36.0–46.0)
Hemoglobin: 10.9 g/dL — ABNORMAL LOW (ref 12.0–15.0)
MCH: 30.7 pg (ref 26.0–34.0)
MCHC: 33.2 g/dL (ref 30.0–36.0)
MCV: 92.4 fL (ref 78.0–100.0)
PLATELETS: 328 10*3/uL (ref 150–400)
RBC: 3.55 MIL/uL — AB (ref 3.87–5.11)
RDW: 13.1 % (ref 11.5–15.5)
WBC: 12.8 10*3/uL — ABNORMAL HIGH (ref 4.0–10.5)

## 2016-06-18 LAB — URINE CULTURE: Culture: >=100000 — AB

## 2016-06-18 MED ORDER — PREDNISONE 20 MG PO TABS: 40.0000 mg | ORAL_TABLET | Freq: Every day | ORAL | Status: DC

## 2016-06-18 MED ORDER — PREDNISONE 20 MG PO TABS
40.0000 mg | ORAL_TABLET | Freq: Every day | ORAL | Status: AC
  Administered 2016-06-19: 40 mg via ORAL
  Filled 2016-06-18: qty 2

## 2016-06-18 MED ORDER — PREDNISONE 20 MG PO TABS
30.0000 mg | ORAL_TABLET | Freq: Every day | ORAL | Status: DC
  Filled 2016-06-18: qty 1

## 2016-06-18 MED ORDER — PREDNISONE 20 MG PO TABS: 20.0000 mg | ORAL_TABLET | Freq: Every day | ORAL | Status: DC

## 2016-06-18 MED ORDER — PREDNISONE 10 MG PO TABS: 10.0000 mg | ORAL_TABLET | Freq: Every day | ORAL | Status: DC

## 2016-06-18 NOTE — Progress Notes (Signed)
Patient ID: Gina Bowers, female   DOB: 04/11/1926, 83 y.o.   MRN: [id number]    PROGRESS NOTE    Gina Bowers  [id number] DOB: 11/04/1925 DOA: 06/10/2016  PCP: Irven Shelling, MD   Brief Narrative:  83 y.o.femalewith coronary artery disease, status post CABG, hypertension, hyperlipidemia, hypothyroidism, who presented to the emergency department for evaluation of increasing weakness, shortness of breath, congestion, confusion. The daughter, who was present at bedside reported that patient had fever of 100.75F, at times would be more confused than usual.  ED Course:In the emergency room, pt's temperature was 101.68F on arrival, otherwise vital signs were stable. Blood work revealed mild leukocytosis of 11,600 and chest x-ray was negative for acute cardiopulmonary process.   Assessment and Plan: Acute metabolic encephalopathy - secondary to RSV respiratory infection and UTI - pt more alert this AM and feels better  - continue to provide supportive care for RSV - PT eval requested, HH PT recommended   Acute viral bronchitis, RSV + - continue to provide supportive care, fever curve trending down  - bronchodilators as needed  - antitussives as needed  - pt overall improving, taper down steroids   UTI - urine cultures negative initially but now positive for enterococcus infection, final sensitivity report pending - currently on Unasyn day #2, adjust abx coverage based on sensitivity report   Hyponatremia - mild, encouraged poor oral intake and Na remains overall stable  - BMP in AM  LE edema - trace, no signs of volume overload - ECHO done on the admission with stable EF   CAD, status post CABG  - currently angina free, vital signs stable - Continue aspirin, Plavix, beta blocker, Imdur   Hyperlipidemia - Continue Crestor   Hypertension, essential  - fluctuating, continue home medical regimen   Anxiety/depression - Continue  Zoloft  Hypothyroidism - Continue Synthroid  Obesity - Body mass index is 32.12 kg/m  DVT prophylaxis:Lovenox SQ Code Status:DNR Family Communication:Patient and daughter at bedside  Disposition Plan:Home once urine cultures are back   Consultants:  None  Procedures:  None   Antimicrobials:   Rocephin 1/17 --> 1/23  Unasyn 1/23 -->   Subjective: No events overnight.   Objective: Vitals:   06/17/16 1951 06/17/16 2131 06/18/16 0543 06/18/16 0902  BP:  (!) 150/65 (!) 176/88   Pulse:  69 68   Resp:  17 17   Temp:  98.3 F (36.8 C) 98.4 F (36.9 C)   TempSrc:  Oral Oral   SpO2: 93% 94% 96% 97%  Weight:   79.7 kg (175 lb 11.3 oz)   Height:        Intake/Output Summary (Last 24 hours) at 06/18/16 0955 Last data filed at 06/18/16 0300  Gross per 24 hour  Intake              900 ml  Output                0 ml  Net              900 ml   Filed Weights   06/16/16 0608 06/17/16 0602 06/18/16 0543  Weight: 80.9 kg (178 lb 5.6 oz) 78.9 kg (173 lb 15.1 oz) 79.7 kg (175 lb 11.3 oz)    Examination:  General exam: Appears calm and comfortable  Respiratory system: Clear to auscultation. Respiratory effort normal. Cardiovascular system: S1 & S2 heard, RRR. No JVD, rubs, gallops or clicks. No pedal edema. Gastrointestinal system: Abdomen is nondistended,  soft and nontender. No organomegaly or masses felt.  Central nervous system: Alert and oriented. No focal neurological deficits. Extremities: Symmetric 5 x 5 power. Skin: No rashes, lesions or ulcers Psychiatry: Judgement and insight appear normal. Mood & affect appropriate.   Data Reviewed: I have personally reviewed following labs and imaging studies  CBC:  Recent Labs Lab 06/13/16 0527 06/14/16 0434 06/15/16 0717 06/16/16 0547 06/18/16 0634  WBC 6.7 8.4 9.3 9.9 12.8*  HGB 10.1* 9.9* 10.2* 10.3* 10.9*  HCT 30.3* 30.1* 30.4* 30.5* 32.8*  MCV 92.4 93.2 92.4 92.1 92.4  PLT 237 252 255 297 [id number]    Basic Metabolic Panel:  Recent Labs Lab 06/13/16 0527 06/14/16 0434 06/15/16 0717 06/16/16 0547 06/18/16 0634  NA 135 136 133* 133* 134*  K 3.5 3.9 3.9 4.3 3.6  CL 98* 95* 97* 97* 97*  CO2 30 32 28 29 26   GLUCOSE 116* 99 100* 96 97  BUN <5* 6 10 15 15   CREATININE 0.39* 0.44 0.43* 0.43* 0.55  CALCIUM 8.5* 8.6* 8.9 8.9 9.1   Urine analysis:    Component Value Date/Time   COLORURINE STRAW (A) 06/16/2016 1059   APPEARANCEUR CLEAR 06/16/2016 1059   LABSPEC 1.006 06/16/2016 1059   PHURINE 7.0 06/16/2016 1059   GLUCOSEU NEGATIVE 06/16/2016 1059   HGBUR NEGATIVE 06/16/2016 1059   [place] 06/16/2016 1059   [place] 06/16/2016 1059   PROTEINUR NEGATIVE 06/16/2016 1059   UROBILINOGEN 0.2 07/12/2012 1448   NITRITE NEGATIVE 06/16/2016 1059   LEUKOCYTESUR MODERATE (A) 06/16/2016 1059   Recent Results (from the past 240 hour(s))  Blood culture (routine x 2)     Status: None   Collection Time: 06/10/16 11:45 AM  Result Value Ref Range Status   Specimen Description BLOOD RIGHT WRIST  Final   Special Requests BOTTLES DRAWN AEROBIC AND ANAEROBIC 5CC  Final   Culture NO GROWTH 5 DAYS  Final   Report Status 06/15/2016 FINAL  Final  Urine culture     Status: None   Collection Time: 06/10/16 11:50 AM  Result Value Ref Range Status   Specimen Description URINE, RANDOM  Final   Special Requests NONE  Final   Culture NO GROWTH  Final   Report Status 06/11/2016 FINAL  Final  Respiratory Panel by PCR     Status: Abnormal   Collection Time: 06/10/16 12:12 PM  Result Value Ref Range Status   Adenovirus NOT DETECTED NOT DETECTED Final   Coronavirus 229E NOT DETECTED NOT DETECTED Final   Coronavirus HKU1 NOT DETECTED NOT DETECTED Final   Coronavirus NL63 NOT DETECTED NOT DETECTED Final   Coronavirus OC43 NOT DETECTED NOT DETECTED Final   Metapneumovirus NOT DETECTED NOT DETECTED Final   Rhinovirus / Enterovirus NOT DETECTED NOT DETECTED Final   Influenza A NOT  DETECTED NOT DETECTED Final   Influenza B NOT DETECTED NOT DETECTED Final   Parainfluenza Virus 1 NOT DETECTED NOT DETECTED Final   Parainfluenza Virus 2 NOT DETECTED NOT DETECTED Final   Parainfluenza Virus 3 NOT DETECTED NOT DETECTED Final   Parainfluenza Virus 4 NOT DETECTED NOT DETECTED Final   Respiratory Syncytial Virus DETECTED (A) NOT DETECTED Final    Comment: CRITICAL RESULT CALLED TO, READ BACK BY AND VERIFIED WITH: J.PANELO,RN 06/10/16 @1956  BY V.WILKINS    Bordetella pertussis NOT DETECTED NOT DETECTED Final   Chlamydophila pneumoniae NOT DETECTED NOT DETECTED Final   Mycoplasma pneumoniae NOT DETECTED NOT DETECTED Final    Radiology Studies: No results found.  Scheduled Meds: . ampicillin-sulbactam (UNASYN) IV  3 g Intravenous Q8H  . aspirin EC  81 mg Oral QPM  . clopidogrel  75 mg Oral Daily  . darifenacin  7.5 mg Oral Daily  . ipratropium-albuterol  3 mL Nebulization TID  . isosorbide mononitrate  30 mg Oral Daily  . levothyroxine  75 mcg Oral QAC breakfast  . losartan  25 mg Oral BID  . methylPREDNISolone (SOLU-MEDROL) injection  40 mg Intravenous Daily  . metoprolol succinate  25 mg Oral Daily  . omega-3 acid ethyl esters  1 g Oral QPM  . pantoprazole  40 mg Oral Daily  . polyvinyl alcohol  1 drop Both Eyes TID  . rosuvastatin  20 mg Oral QPM  . senna-docusate  1 tablet Oral BID  . sertraline  50 mg Oral Daily   Continuous Infusions:   LOS: 8 days   Time spent: 20 minutes   Faye Ramsay, MD [facility] Pager [phone]  If 7PM-7AM, please contact night-coverage [email or web address] Password [facility] 06/18/2016, 9:55 AM

## 2016-06-18 NOTE — Progress Notes (Signed)
Patients pulseox dropped into low 80's while sleeping several times during night. 2L O2/[place] added. Incentive spirometer encouraged. Patient rested comfortably  Rest of night. Will continue to monitor.  Jimmie Molly, RN

## 2016-06-19 DIAGNOSIS — Z951 Presence of aortocoronary bypass graft: Secondary | ICD-10-CM

## 2016-06-19 DIAGNOSIS — I10 Essential (primary) hypertension: Secondary | ICD-10-CM

## 2016-06-19 DIAGNOSIS — N3000 Acute cystitis without hematuria: Secondary | ICD-10-CM

## 2016-06-19 DIAGNOSIS — N10 Acute tubulo-interstitial nephritis: Secondary | ICD-10-CM

## 2016-06-19 DIAGNOSIS — G4733 Obstructive sleep apnea (adult) (pediatric): Secondary | ICD-10-CM

## 2016-06-19 DIAGNOSIS — R6510 Systemic inflammatory response syndrome (SIRS) of non-infectious origin without acute organ dysfunction: Secondary | ICD-10-CM

## 2016-06-19 DIAGNOSIS — I25708 Atherosclerosis of coronary artery bypass graft(s), unspecified, with other forms of angina pectoris: Secondary | ICD-10-CM

## 2016-06-19 DIAGNOSIS — I6350 Cerebral infarction due to unspecified occlusion or stenosis of unspecified cerebral artery: Secondary | ICD-10-CM

## 2016-06-19 LAB — CBC
HEMATOCRIT: 30.6 % — AB (ref 36.0–46.0)
HEMOGLOBIN: 10 g/dL — AB (ref 12.0–15.0)
MCH: 30.2 pg (ref 26.0–34.0)
MCHC: 32.7 g/dL (ref 30.0–36.0)
MCV: 92.4 fL (ref 78.0–100.0)
Platelets: 327 10*3/uL (ref 150–400)
RBC: 3.31 MIL/uL — AB (ref 3.87–5.11)
RDW: 13.2 % (ref 11.5–15.5)
WBC: 12.3 10*3/uL — ABNORMAL HIGH (ref 4.0–10.5)

## 2016-06-19 LAB — BASIC METABOLIC PANEL
Anion gap: 9 (ref 5–15)
BUN: 14 mg/dL (ref 6–20)
CHLORIDE: 98 mmol/L — AB (ref 101–111)
CO2: 25 mmol/L (ref 22–32)
CREATININE: 0.51 mg/dL (ref 0.44–1.00)
Calcium: 8.6 mg/dL — ABNORMAL LOW (ref 8.9–10.3)
GFR calc Af Amer: >60 mL/min (ref >60)
GFR calc non Af Amer: >60 mL/min (ref >60)
Glucose, Bld: 110 mg/dL — ABNORMAL HIGH (ref 65–99)
POTASSIUM: 3.8 mmol/L (ref 3.5–5.1)
SODIUM: 132 mmol/L — AB (ref 135–145)

## 2016-06-19 MED ORDER — AMOXICILLIN 500 MG PO TABS: 500.0000 mg | ORAL_TABLET | Freq: Three times a day (TID) | ORAL | 0 refills | Status: DC

## 2016-06-19 NOTE — Consult Note (Signed)
[facility] CM Primary Care Navigator  06/19/2016  Gina Bowers [birth year] [id number]  Met with patient and daughter [name]) at the bedside to identify possible discharge needs. Patient's daughter reports fever, confusion, respiratory congestion and report of UTI from urologist that had led to this admission.  Patient endorses Dr. Lavone Orn with [facility] at [facility] as her primary care provider.  Patient resides at [facility] facility for 5 years with a private pay caregiver who assists her in a daily basis as reported.   Patient shared using [facility] in [place] and in [place] (when with daughter) to obtain medications without difficulty.   Patient's daughter manages medications for her using "pill box" system every 2 weeks.   Family provides transportation to her doctors' appointments.  Discharge plan is to go home to daughter's house for recovery then, will return back to [place]. She will be discharged with home health services from [facility] per daughter.  Patient and daughter voiced understanding to call primary care provider's office when she returns home, for a post discharge follow-up appointment within a week or sooner if needed. Patient letter provided as a reminder.  Both patient and daughter denied any needs or concerns regarding further health management at this time. [facility] care management contact information provided for future needs may arise.  For additional questions please contact:  Edwena Felty A. Trevor Duty, BSN, RN-BC [facility] PRIMARY CARE Navigator Cell: [phone]

## 2016-06-19 NOTE — Progress Notes (Signed)
Gina Bowers to be D/C'd  per MD order. Discussed with the patient and all questions fully answered.  VSS, Skin clean, dry and intact without evidence of skin break down, no evidence of skin tears noted.  IV catheter discontinued intact. Site without signs and symptoms of complications. Dressing and pressure applied.  An After Visit Summary was printed and given to the patient. Patient received prescription.  D/c education completed with patient/family including follow up instructions, medication list, d/c activities limitations if indicated, with other d/c instructions as indicated by MD - patient able to verbalize understanding, all questions fully answered.   Patient instructed to return to ED, call 911, or call MD for any changes in condition.   Patient to be escorted via [place], and D/C home via private auto.

## 2016-06-19 NOTE — Progress Notes (Signed)
Pharmacy Antibiotic Note  Gina Bowers is a 83 y.o. female admitted on 06/10/2016 with UTI.  Pharmacy has been consulted for Unasyn dosing.  Pt with UTI symptoms and was feeling worse. UA looked a bit worse than on admit.  Pt initially treated with Ceftriaxone for [facility] UTI (from outpt cx) on admit and changed to Unasyn.  Hx Enterococcal UTI 09/2015, s: Ampicillin and r: Levofloxacin. Culture reveals  E. faecalis.  Plan: Unasyn 3g IV q8h Recommend 7 days total antibiotics Consider amoxicillin upon discharge  Pharmacy signing off   Height: 5\' 1"  (154.9 cm) Weight: 177 lb 11.1 oz (80.6 kg) IBW/kg (Calculated) : 47.8  Temp (24hrs), Avg:98.2 F (36.8 C), Min:97.8 F (36.6 C), Max:98.8 F (37.1 C)   Recent Labs Lab 06/14/16 0434 06/15/16 0717 06/16/16 0547 06/18/16 0634 06/19/16 0429  WBC 8.4 9.3 9.9 12.8* 12.3*  CREATININE 0.44 0.43* 0.43* 0.55 0.51    Estimated Creatinine Clearance: 46.7 mL/min (by C-G formula based on SCr of 0.51 mg/dL).    Allergies  Allergen Reactions  . Sulfa Antibiotics Hives  . Sulfonamide Derivatives Hives    Antimicrobials this admission:  CTX 1/17 >> 1/23 Unasyn 1/23 >>   Microbiology results:  1/17 resp panel PCR - RSV 1/17 UCx - negative 1/17 BCx x2 - negative 1/23 UCx - E. faecalis (S amp, nitrofurantoin, vanc)  Thank you for allowing pharmacy to be a part of this patient's care.   Renold Genta, PharmD, BCPS Clinical Pharmacist Phone for today - [place] - [phone] 06/19/2016 10:32 AM

## 2016-06-19 NOTE — Care Management Important Message (Signed)
Important Message  Patient Details  Name: Gina Bowers MRN: [id number] Date of Birth: 01-12-1926   Medicare Important Message Given:  Yes    [name] [name] 06/19/2016, 2:30 PM

## 2016-06-19 NOTE — Discharge Summary (Signed)
Physician Discharge Summary  Gina Bowers [id number] DOB: 1925-10-28 DOA: 06/10/2016  PCP: Irven Shelling, MD  Admit date: 06/10/2016 Discharge date: 06/18/2016  Recommendations for Outpatient Follow-up:  1. Pt will need to follow up with PCP in 1-2 weeks post discharge 2. Please obtain BMP to evaluate electrolytes and kidney function. 3. Amoxicillin 500 mg 3 times a day for 5 more days   Discharge Diagnoses:  Principal Problem:   Urinary tract infection Active Problems:   Hypothyroidism   Obstructive sleep apnea   Cerebral artery occlusion with cerebral infarction [facility])   Discharge Condition: Stable Diet recommendation: Heart healthy diet discussed in details   Brief Narrative:  83 y.o.femalewith coronary artery disease, status post CABG, hypertension, hyperlipidemia, hypothyroidism, who presented to the emergency department for evaluation of increasing weakness, shortness of breath, congestion, confusion. The daughter, who was present at bedside reported that patient had fever of 100.674F, at times would be more confused than usual.  ED Course:In the emergency room, pt's temperature was 101.74F on arrival, otherwise vital signs were stable. Blood work revealed mild leukocytosis of 11,600 and chest x-ray was negative for acute cardiopulmonary process.   Assessment and Plan:  Acute metabolic encephalopathy - Came in with confusion, this is likely secondary to acute viral bronchitis and UTI. - This is improved as a treatment for the above 2 mentioned problem and instituted. - On discharge per daughter at bedside she is back to her baseline.  Acute viral bronchitis, RSV + - Continue to provide supportive care, fever curve trending down  - This is treated with supportive management with bronchodilators, mucolytics, antitussives and oxygen as needed. - Seen by PT recommended home health PT. - Doing very well no requirement for oxygen on discharge.  Enterococcus  UTI -Urinalysis was consistent with UTI with TNTC WBCs. -Started initially on Unasyn, culture shows ampicillin sensitive enterococcus, discharge amoxicillin for 5 more days.  Hyponatremia - mild, encouraged poor oral intake and Na remains overall stable   LE edema - trace, no signs of volume overload - ECHO done on the admission with stable EF   CAD, status post CABG  - currently angina free, vital signs stable - Continue aspirin, Plavix, beta blocker, Imdur   Hyperlipidemia - Continue Crestor   Hypertension, essential  - fluctuating, continue home medical regimen   Anxiety/depression - Continue Zoloft  Hypothyroidism - Continue Synthroid  Obesity - Body mass index is 32.12 kg/m   Consultants:   None  Procedures:   None    Procedures/Studies: Dg Chest 2 View  Result Date: 06/10/2016 CLINICAL DATA:  Shortness of breath, cough, history coronary artery disease, stroke, CHF, hypertension EXAM: CHEST  2 VIEW COMPARISON:  10/04/2015 FINDINGS: Normal heart size post CABG. Atherosclerotic calcification aorta. Lungs clear. No pleural effusion or pneumothorax. Diffuse osseous demineralization. IMPRESSION: No acute abnormalities. Aortic atherosclerosis. Electronically Signed   By: Lavonia Dana M.D.   On: 06/10/2016 12:30   Ct Chest Wo Contrast  Result Date: 06/13/2016 CLINICAL DATA:  83 y/o  F; dyspnea. EXAM: CT CHEST WITHOUT CONTRAST TECHNIQUE: Multidetector CT imaging of the chest was performed following the standard protocol without IV contrast. COMPARISON:  06/10/2016 chest radiograph. FINDINGS: Cardiovascular: Normal heart size. No pericardial effusion. Severe coronary artery calcifications. Aortic valvular calcification associated with aortic stenosis. Ascending thoracic aorta measures 40 mm. Aortic atherosclerosis with mild calcification. Mediastinum/Nodes: No lymphadenopathy.  Large hiatal hernia. Lungs/Pleura: Left upper level punctate calcified granuloma.  Peripheral reticular markings with lung base predominance compatible with  mild pulmonary fibrosis. No honeycombing. No pleural effusion. No consolidation. Mild bronchiectatic changes at the lung bases and scattered mucous plugging. Upper Abdomen: Aortic atherosclerosis. Left upper pole renal cyst measuring 4.6 cm. Musculoskeletal: Healed median sternotomy. T10 vertebral body mild compression deformity post kyphoplasty. Right third anterior subacute rib fracture with callus. IMPRESSION: 1. Severe coronary artery calcification. 2. Aortic valvular calcification associated with aortic stenosis. 3. 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 Recommend annual imaging followup by CTA or MRA. This recommendation follows 2010 ACCF/AHA/AATS/ACR/ASA/SCA/SCAI/SIR/STS/SVM Guidelines for the Diagnosis and Management of Patients with Thoracic Aortic Disease. Circulation. 2010; 121: [id number] 4. Mild aortic atherosclerosis. 5. Mild pulmonary fibrosis. Mild basilar bronchiectasis. No honeycombing. Electronically Signed   By: Kristine Garbe M.D.   On: 06/13/2016 04:00   Discharge Exam: Vitals:   06/18/16 2137 06/19/16 0517  BP: 140/68 (!) 167/80  Pulse: 69 65  Resp: 18 18  Temp: 98.8 F (37.1 C) 98 F (36.7 C)   Vitals:   06/18/16 1432 06/18/16 1756 06/18/16 2137 06/19/16 0517  BP: (!) 143/58  140/68 (!) 167/80  Pulse: 74  69 65  Resp: 18  18 18   Temp: 97.8 F (36.6 C)  98.8 F (37.1 C) 98 F (36.7 C)  TempSrc: Oral  Oral Oral  SpO2: 96% 95% 91% 94%  Weight:    80.6 kg (177 lb 11.1 oz)  Height:        General: Pt is alert, follows commands appropriately, not in acute distress Cardiovascular: Regular rate and rhythm, S1/S2 +, no murmurs, no rubs, no gallops Respiratory: Clear to auscultation bilaterally, no wheezing, no crackles, no rhonchi Abdominal: Soft, non tender, non distended, bowel sounds +, no guarding Extremities: no edema, no cyanosis, pulses palpable bilaterally DP and PT  Discharge  Instructions  Discharge Instructions    Diet - low sodium heart healthy    Complete by:  As directed    Increase activity slowly    Complete by:  As directed      Allergies as of 06/19/2016      Reactions   Sulfa Antibiotics Hives   Sulfonamide Derivatives Hives      Medication List    STOP taking these medications   cephALEXin 250 MG capsule Commonly known as:  KEFLEX   nitrofurantoin (macrocrystal-monohydrate) 100 MG capsule Commonly known as:  MACROBID     TAKE these medications   acetaminophen 500 MG tablet Commonly known as:  TYLENOL Take 1,000 mg by mouth every 6 (six) hours as needed for mild pain or fever.   alendronate 70 MG tablet Commonly known as:  FOSAMAX Take 1 tablet (70 mg total) by mouth once a week. Take with a full glass of water on an empty stomach.mon What changed:  when to take this  additional instructions   amoxicillin 500 MG tablet Commonly known as:  AMOXIL Take 1 tablet (500 mg total) by mouth 3 (three) times daily.   aspirin EC 81 MG tablet Take 81 mg by mouth every evening.   CALCIUM 1000 + D PO Take 1 tablet by mouth every evening.   clopidogrel 75 MG tablet Commonly known as:  PLAVIX Take 1 tablet (75 mg total) by mouth daily.   CRANBERRY EXTRACT PO Take 500 mg by mouth 2 (two) times daily.   fish oil-omega-3 fatty acids 1000 MG capsule Take 1 g by mouth every evening.   ipratropium-albuterol 0.5-2.5 (3) MG/3ML Soln Commonly known as:  DUONEB Take 3 mLs by nebulization every 4 (four) hours  as needed.   isosorbide mononitrate 30 MG 24 hr tablet Commonly known as:  IMDUR Take 1 tablet (30 mg total) by mouth daily. What changed:  how much to take  when to take this   levothyroxine 75 MCG tablet Commonly known as:  SYNTHROID, LEVOTHROID Take 1 tablet (75 mcg total) by mouth daily before breakfast.   losartan 25 MG tablet Commonly known as:  COZAAR Take 1 tablet (25 mg total) by mouth 2 (two) times daily.    meclizine 25 MG tablet Commonly known as:  ANTIVERT Take 25 mg by mouth 3 (three) times daily as needed for dizziness.   metoprolol succinate 25 MG 24 hr tablet Commonly known as:  TOPROL-XL TAKE ONE TABLET BY MOUTH DAILY   multivitamin with minerals Tabs tablet Take 1 tablet by mouth daily with breakfast.   nitroGLYCERIN 0.4 MG SL tablet Commonly known as:  NITROSTAT Place 1 tablet (0.4 mg total) under the tongue every 5 (five) minutes as needed for chest pain.   ondansetron 4 MG disintegrating tablet Commonly known as:  ZOFRAN ODT Take one every 6 hours for nausea What changed:  how much to take  how to take this  when to take this  reasons to take this  additional instructions   OVER THE COUNTER MEDICATION See admin instructions. Immuplex Capsule: Take 1 capsule by mouth 2 times a day   pantoprazole 40 MG tablet Commonly known as:  PROTONIX Take 1 tablet (40 mg total) by mouth daily.   predniSONE 10 MG tablet Commonly known as:  DELTASONE Take 50 mg tablet on 1/24 and taper down by 10 mg daily until completed   PROAIR RESPICLICK 123XX123 (90 Base) MCG/ACT Aepb Generic drug:  Albuterol Sulfate Inhale 2 puffs into the lungs daily as needed (shortness of breath).   Probiotic Caps Take 1 capsule by mouth daily with breakfast.   rosuvastatin 20 MG tablet Commonly known as:  CRESTOR Take 1 tablet (20 mg total) by mouth daily. What changed:  when to take this   sertraline 50 MG tablet Commonly known as:  ZOLOFT Take 1 tablet (50 mg total) by mouth daily.   solifenacin 5 MG tablet Commonly known as:  VESICARE Take 1 tablet (5 mg total) by mouth daily. What changed:  when to take this   SYSTANE OP Place 1 drop into both eyes 3 (three) times daily. Patient states its the Gel form (soothes)            Durable Medical Equipment        Start     Ordered   06/15/16 1233  For home use only DME Nebulizer machine  Once    Question:  Patient needs a nebulizer  to treat with the following condition  Answer:  PNA (pneumonia)   06/15/16 1233     Follow-up Information    Irven Shelling, MD Follow up.   Specialty:  Internal Medicine Contact information: [address]  [place] [address] [phone]          The results of significant diagnostics from this hospitalization (including imaging, microbiology, ancillary and laboratory) are listed below for reference.     Microbiology: Recent Results (from the past 240 hour(s))  Blood culture (routine x 2)     Status: None   Collection Time: 06/10/16 11:45 AM  Result Value Ref Range Status   Specimen Description BLOOD RIGHT WRIST  Final   Special Requests BOTTLES DRAWN AEROBIC AND ANAEROBIC 5CC  Final   Culture NO GROWTH 5 DAYS  Final   Report Status 06/15/2016 FINAL  Final  Urine culture     Status: None   Collection Time: 06/10/16 11:50 AM  Result Value Ref Range Status   Specimen Description URINE, RANDOM  Final   Special Requests NONE  Final   Culture NO GROWTH  Final   Report Status 06/11/2016 FINAL  Final  Respiratory Panel by PCR     Status: Abnormal   Collection Time: 06/10/16 12:12 PM  Result Value Ref Range Status   Adenovirus NOT DETECTED NOT DETECTED Final   Coronavirus 229E NOT DETECTED NOT DETECTED Final   Coronavirus HKU1 NOT DETECTED NOT DETECTED Final   Coronavirus NL63 NOT DETECTED NOT DETECTED Final   Coronavirus OC43 NOT DETECTED NOT DETECTED Final   Metapneumovirus NOT DETECTED NOT DETECTED Final   Rhinovirus / Enterovirus NOT DETECTED NOT DETECTED Final   Influenza A NOT DETECTED NOT DETECTED Final   Influenza B NOT DETECTED NOT DETECTED Final   Parainfluenza Virus 1 NOT DETECTED NOT DETECTED Final   Parainfluenza Virus 2 NOT DETECTED NOT DETECTED Final   Parainfluenza Virus 3 NOT DETECTED NOT DETECTED Final   Parainfluenza Virus 4 NOT DETECTED NOT DETECTED Final   Respiratory Syncytial Virus DETECTED (A) NOT DETECTED Final    Comment:  CRITICAL RESULT CALLED TO, READ BACK BY AND VERIFIED WITH: J.PANELO,RN 06/10/16 @1956  BY V.WILKINS    Bordetella pertussis NOT DETECTED NOT DETECTED Final   Chlamydophila pneumoniae NOT DETECTED NOT DETECTED Final   Mycoplasma pneumoniae NOT DETECTED NOT DETECTED Final  Culture, Urine     Status: Abnormal   Collection Time: 06/16/16 10:59 AM  Result Value Ref Range Status   Specimen Description URINE, CLEAN CATCH  Final   Special Requests NONE  Final   Culture >=100,000 COLONIES/mL ENTEROCOCCUS FAECALIS (A)  Final   Report Status 06/18/2016 FINAL  Final   Organism ID, Bacteria ENTEROCOCCUS FAECALIS (A)  Final      Susceptibility   Enterococcus faecalis - MIC*    AMPICILLIN <=2 SENSITIVE Sensitive     LEVOFLOXACIN >=8 RESISTANT Resistant     NITROFURANTOIN <=16 SENSITIVE Sensitive     VANCOMYCIN 1 SENSITIVE Sensitive     * >=100,000 COLONIES/mL ENTEROCOCCUS FAECALIS     Labs: Basic Metabolic Panel:  Recent Labs Lab 06/14/16 0434 06/15/16 0717 06/16/16 0547 06/18/16 0634 06/19/16 0429  NA 136 133* 133* 134* 132*  K 3.9 3.9 4.3 3.6 3.8  CL 95* 97* 97* 97* 98*  CO2 32 28 29 26 25   GLUCOSE 99 100* 96 97 110*  BUN 6 10 15 15 14   CREATININE 0.44 0.43* 0.43* 0.55 0.51  CALCIUM 8.6* 8.9 8.9 9.1 8.6*   CBC:  Recent Labs Lab 06/14/16 0434 06/15/16 0717 06/16/16 0547 06/18/16 0634 06/19/16 0429  WBC 8.4 9.3 9.9 12.8* 12.3*  HGB 9.9* 10.2* 10.3* 10.9* 10.0*  HCT 30.1* 30.4* 30.5* 32.8* 30.6*  MCV 93.2 92.4 92.1 92.4 92.4  PLT 252 255 297 328 327    SIGNED: Time coordinating discharge:  30 minutes  Odus Clasby A, MD  [facility] 06/19/2016, 10:48 AM Pager [phone]  If 7PM-7AM, please contact night-coverage [email or web address] Password [email or web address]

## 2016-06-20 DIAGNOSIS — F419 Anxiety disorder, unspecified: Secondary | ICD-10-CM | POA: Diagnosis not present

## 2016-06-20 DIAGNOSIS — I2510 Atherosclerotic heart disease of native coronary artery without angina pectoris: Secondary | ICD-10-CM | POA: Diagnosis not present

## 2016-06-20 DIAGNOSIS — E039 Hypothyroidism, unspecified: Secondary | ICD-10-CM | POA: Diagnosis not present

## 2016-06-20 DIAGNOSIS — Z9181 History of falling: Secondary | ICD-10-CM | POA: Diagnosis not present

## 2016-06-20 DIAGNOSIS — Z7901 Long term (current) use of anticoagulants: Secondary | ICD-10-CM | POA: Diagnosis not present

## 2016-06-20 DIAGNOSIS — I5032 Chronic diastolic (congestive) heart failure: Secondary | ICD-10-CM | POA: Diagnosis not present

## 2016-06-20 DIAGNOSIS — N390 Urinary tract infection, site not specified: Secondary | ICD-10-CM | POA: Diagnosis not present

## 2016-06-20 DIAGNOSIS — Z96642 Presence of left artificial hip joint: Secondary | ICD-10-CM | POA: Diagnosis not present

## 2016-06-20 DIAGNOSIS — J984 Other disorders of lung: Secondary | ICD-10-CM | POA: Diagnosis not present

## 2016-06-20 DIAGNOSIS — M21372 Foot drop, left foot: Secondary | ICD-10-CM | POA: Diagnosis not present

## 2016-06-20 DIAGNOSIS — I110 Hypertensive heart disease with heart failure: Secondary | ICD-10-CM | POA: Diagnosis not present

## 2016-06-20 DIAGNOSIS — Z87440 Personal history of urinary (tract) infections: Secondary | ICD-10-CM | POA: Diagnosis not present

## 2016-06-20 DIAGNOSIS — E785 Hyperlipidemia, unspecified: Secondary | ICD-10-CM | POA: Diagnosis not present

## 2016-06-20 DIAGNOSIS — I69354 Hemiplegia and hemiparesis following cerebral infarction affecting left non-dominant side: Secondary | ICD-10-CM | POA: Diagnosis not present

## 2016-06-20 DIAGNOSIS — G4733 Obstructive sleep apnea (adult) (pediatric): Secondary | ICD-10-CM | POA: Diagnosis not present

## 2016-06-20 DIAGNOSIS — F329 Major depressive disorder, single episode, unspecified: Secondary | ICD-10-CM | POA: Diagnosis not present

## 2016-06-20 DIAGNOSIS — J209 Acute bronchitis, unspecified: Secondary | ICD-10-CM | POA: Diagnosis not present

## 2016-06-20 DIAGNOSIS — Z951 Presence of aortocoronary bypass graft: Secondary | ICD-10-CM | POA: Diagnosis not present

## 2016-06-20 DIAGNOSIS — B952 Enterococcus as the cause of diseases classified elsewhere: Secondary | ICD-10-CM | POA: Diagnosis not present

## 2016-06-24 DIAGNOSIS — J209 Acute bronchitis, unspecified: Secondary | ICD-10-CM | POA: Diagnosis not present

## 2016-06-24 DIAGNOSIS — I69354 Hemiplegia and hemiparesis following cerebral infarction affecting left non-dominant side: Secondary | ICD-10-CM | POA: Diagnosis not present

## 2016-06-24 DIAGNOSIS — N390 Urinary tract infection, site not specified: Secondary | ICD-10-CM | POA: Diagnosis not present

## 2016-06-24 DIAGNOSIS — I110 Hypertensive heart disease with heart failure: Secondary | ICD-10-CM | POA: Diagnosis not present

## 2016-06-24 DIAGNOSIS — B952 Enterococcus as the cause of diseases classified elsewhere: Secondary | ICD-10-CM | POA: Diagnosis not present

## 2016-06-24 DIAGNOSIS — M21372 Foot drop, left foot: Secondary | ICD-10-CM | POA: Diagnosis not present

## 2016-06-25 DIAGNOSIS — B952 Enterococcus as the cause of diseases classified elsewhere: Secondary | ICD-10-CM | POA: Diagnosis not present

## 2016-06-25 DIAGNOSIS — J209 Acute bronchitis, unspecified: Secondary | ICD-10-CM | POA: Diagnosis not present

## 2016-06-25 DIAGNOSIS — N390 Urinary tract infection, site not specified: Secondary | ICD-10-CM | POA: Diagnosis not present

## 2016-06-25 DIAGNOSIS — M21372 Foot drop, left foot: Secondary | ICD-10-CM | POA: Diagnosis not present

## 2016-06-25 DIAGNOSIS — I69354 Hemiplegia and hemiparesis following cerebral infarction affecting left non-dominant side: Secondary | ICD-10-CM | POA: Diagnosis not present

## 2016-06-25 DIAGNOSIS — I110 Hypertensive heart disease with heart failure: Secondary | ICD-10-CM | POA: Diagnosis not present

## 2016-06-26 DIAGNOSIS — J209 Acute bronchitis, unspecified: Secondary | ICD-10-CM | POA: Diagnosis not present

## 2016-06-26 DIAGNOSIS — B952 Enterococcus as the cause of diseases classified elsewhere: Secondary | ICD-10-CM | POA: Diagnosis not present

## 2016-06-26 DIAGNOSIS — N390 Urinary tract infection, site not specified: Secondary | ICD-10-CM | POA: Diagnosis not present

## 2016-06-26 DIAGNOSIS — I110 Hypertensive heart disease with heart failure: Secondary | ICD-10-CM | POA: Diagnosis not present

## 2016-06-26 DIAGNOSIS — I69354 Hemiplegia and hemiparesis following cerebral infarction affecting left non-dominant side: Secondary | ICD-10-CM | POA: Diagnosis not present

## 2016-06-26 DIAGNOSIS — M21372 Foot drop, left foot: Secondary | ICD-10-CM | POA: Diagnosis not present

## 2016-06-29 DIAGNOSIS — J209 Acute bronchitis, unspecified: Secondary | ICD-10-CM | POA: Diagnosis not present

## 2016-06-29 DIAGNOSIS — I110 Hypertensive heart disease with heart failure: Secondary | ICD-10-CM | POA: Diagnosis not present

## 2016-06-29 DIAGNOSIS — I69354 Hemiplegia and hemiparesis following cerebral infarction affecting left non-dominant side: Secondary | ICD-10-CM | POA: Diagnosis not present

## 2016-06-29 DIAGNOSIS — B952 Enterococcus as the cause of diseases classified elsewhere: Secondary | ICD-10-CM | POA: Diagnosis not present

## 2016-06-29 DIAGNOSIS — M21372 Foot drop, left foot: Secondary | ICD-10-CM | POA: Diagnosis not present

## 2016-06-29 DIAGNOSIS — N390 Urinary tract infection, site not specified: Secondary | ICD-10-CM | POA: Diagnosis not present

## 2016-06-30 DIAGNOSIS — N390 Urinary tract infection, site not specified: Secondary | ICD-10-CM | POA: Diagnosis not present

## 2016-06-30 DIAGNOSIS — I69354 Hemiplegia and hemiparesis following cerebral infarction affecting left non-dominant side: Secondary | ICD-10-CM | POA: Diagnosis not present

## 2016-06-30 DIAGNOSIS — J209 Acute bronchitis, unspecified: Secondary | ICD-10-CM | POA: Diagnosis not present

## 2016-06-30 DIAGNOSIS — I110 Hypertensive heart disease with heart failure: Secondary | ICD-10-CM | POA: Diagnosis not present

## 2016-06-30 DIAGNOSIS — M21372 Foot drop, left foot: Secondary | ICD-10-CM | POA: Diagnosis not present

## 2016-06-30 DIAGNOSIS — B952 Enterococcus as the cause of diseases classified elsewhere: Secondary | ICD-10-CM | POA: Diagnosis not present

## 2016-07-01 ENCOUNTER — Ambulatory Visit
Admission: RE | Admit: 2016-07-01 | Discharge: 2016-07-01 | Disposition: A | Discharge status: Home / Self Care | Payer: Medicare Other | Source: Ambulatory Visit | Attending: Internal Medicine | Admitting: Internal Medicine

## 2016-07-01 ENCOUNTER — Other Ambulatory Visit: Payer: Self-pay | Admitting: Internal Medicine

## 2016-07-01 DIAGNOSIS — N390 Urinary tract infection, site not specified: Secondary | ICD-10-CM | POA: Diagnosis not present

## 2016-07-01 DIAGNOSIS — K5900 Constipation, unspecified: Secondary | ICD-10-CM | POA: Diagnosis not present

## 2016-07-01 DIAGNOSIS — R059 Cough, unspecified: Secondary | ICD-10-CM

## 2016-07-01 DIAGNOSIS — R0602 Shortness of breath: Secondary | ICD-10-CM | POA: Diagnosis not present

## 2016-07-01 DIAGNOSIS — R05 Cough: Secondary | ICD-10-CM

## 2016-07-01 DIAGNOSIS — J189 Pneumonia, unspecified organism: Secondary | ICD-10-CM | POA: Diagnosis not present

## 2016-07-01 DIAGNOSIS — R509 Fever, unspecified: Secondary | ICD-10-CM | POA: Diagnosis not present

## 2016-07-03 DIAGNOSIS — M21372 Foot drop, left foot: Secondary | ICD-10-CM | POA: Diagnosis not present

## 2016-07-03 DIAGNOSIS — N390 Urinary tract infection, site not specified: Secondary | ICD-10-CM | POA: Diagnosis not present

## 2016-07-03 DIAGNOSIS — I110 Hypertensive heart disease with heart failure: Secondary | ICD-10-CM | POA: Diagnosis not present

## 2016-07-03 DIAGNOSIS — I69354 Hemiplegia and hemiparesis following cerebral infarction affecting left non-dominant side: Secondary | ICD-10-CM | POA: Diagnosis not present

## 2016-07-03 DIAGNOSIS — B952 Enterococcus as the cause of diseases classified elsewhere: Secondary | ICD-10-CM | POA: Diagnosis not present

## 2016-07-03 DIAGNOSIS — J209 Acute bronchitis, unspecified: Secondary | ICD-10-CM | POA: Diagnosis not present

## 2016-07-04 DIAGNOSIS — I110 Hypertensive heart disease with heart failure: Secondary | ICD-10-CM | POA: Diagnosis not present

## 2016-07-04 DIAGNOSIS — B952 Enterococcus as the cause of diseases classified elsewhere: Secondary | ICD-10-CM | POA: Diagnosis not present

## 2016-07-04 DIAGNOSIS — N390 Urinary tract infection, site not specified: Secondary | ICD-10-CM | POA: Diagnosis not present

## 2016-07-04 DIAGNOSIS — M21372 Foot drop, left foot: Secondary | ICD-10-CM | POA: Diagnosis not present

## 2016-07-04 DIAGNOSIS — I69354 Hemiplegia and hemiparesis following cerebral infarction affecting left non-dominant side: Secondary | ICD-10-CM | POA: Diagnosis not present

## 2016-07-04 DIAGNOSIS — J209 Acute bronchitis, unspecified: Secondary | ICD-10-CM | POA: Diagnosis not present

## 2016-07-06 DIAGNOSIS — N390 Urinary tract infection, site not specified: Secondary | ICD-10-CM | POA: Diagnosis not present

## 2016-07-06 DIAGNOSIS — I110 Hypertensive heart disease with heart failure: Secondary | ICD-10-CM | POA: Diagnosis not present

## 2016-07-06 DIAGNOSIS — J209 Acute bronchitis, unspecified: Secondary | ICD-10-CM | POA: Diagnosis not present

## 2016-07-06 DIAGNOSIS — B952 Enterococcus as the cause of diseases classified elsewhere: Secondary | ICD-10-CM | POA: Diagnosis not present

## 2016-07-06 DIAGNOSIS — M21372 Foot drop, left foot: Secondary | ICD-10-CM | POA: Diagnosis not present

## 2016-07-06 DIAGNOSIS — I69354 Hemiplegia and hemiparesis following cerebral infarction affecting left non-dominant side: Secondary | ICD-10-CM | POA: Diagnosis not present

## 2016-07-07 DIAGNOSIS — I110 Hypertensive heart disease with heart failure: Secondary | ICD-10-CM | POA: Diagnosis not present

## 2016-07-07 DIAGNOSIS — N390 Urinary tract infection, site not specified: Secondary | ICD-10-CM | POA: Diagnosis not present

## 2016-07-07 DIAGNOSIS — M21372 Foot drop, left foot: Secondary | ICD-10-CM | POA: Diagnosis not present

## 2016-07-07 DIAGNOSIS — B952 Enterococcus as the cause of diseases classified elsewhere: Secondary | ICD-10-CM | POA: Diagnosis not present

## 2016-07-07 DIAGNOSIS — I69354 Hemiplegia and hemiparesis following cerebral infarction affecting left non-dominant side: Secondary | ICD-10-CM | POA: Diagnosis not present

## 2016-07-07 DIAGNOSIS — J209 Acute bronchitis, unspecified: Secondary | ICD-10-CM | POA: Diagnosis not present

## 2016-07-09 DIAGNOSIS — I69354 Hemiplegia and hemiparesis following cerebral infarction affecting left non-dominant side: Secondary | ICD-10-CM | POA: Diagnosis not present

## 2016-07-09 DIAGNOSIS — N390 Urinary tract infection, site not specified: Secondary | ICD-10-CM | POA: Diagnosis not present

## 2016-07-09 DIAGNOSIS — J209 Acute bronchitis, unspecified: Secondary | ICD-10-CM | POA: Diagnosis not present

## 2016-07-09 DIAGNOSIS — I110 Hypertensive heart disease with heart failure: Secondary | ICD-10-CM | POA: Diagnosis not present

## 2016-07-09 DIAGNOSIS — B952 Enterococcus as the cause of diseases classified elsewhere: Secondary | ICD-10-CM | POA: Diagnosis not present

## 2016-07-09 DIAGNOSIS — M21372 Foot drop, left foot: Secondary | ICD-10-CM | POA: Diagnosis not present

## 2016-07-14 DIAGNOSIS — M21372 Foot drop, left foot: Secondary | ICD-10-CM | POA: Diagnosis not present

## 2016-07-14 DIAGNOSIS — J209 Acute bronchitis, unspecified: Secondary | ICD-10-CM | POA: Diagnosis not present

## 2016-07-14 DIAGNOSIS — B952 Enterococcus as the cause of diseases classified elsewhere: Secondary | ICD-10-CM | POA: Diagnosis not present

## 2016-07-14 DIAGNOSIS — N390 Urinary tract infection, site not specified: Secondary | ICD-10-CM | POA: Diagnosis not present

## 2016-07-14 DIAGNOSIS — I110 Hypertensive heart disease with heart failure: Secondary | ICD-10-CM | POA: Diagnosis not present

## 2016-07-14 DIAGNOSIS — I69354 Hemiplegia and hemiparesis following cerebral infarction affecting left non-dominant side: Secondary | ICD-10-CM | POA: Diagnosis not present

## 2016-07-15 DIAGNOSIS — J209 Acute bronchitis, unspecified: Secondary | ICD-10-CM | POA: Diagnosis not present

## 2016-07-15 DIAGNOSIS — B952 Enterococcus as the cause of diseases classified elsewhere: Secondary | ICD-10-CM | POA: Diagnosis not present

## 2016-07-15 DIAGNOSIS — I69354 Hemiplegia and hemiparesis following cerebral infarction affecting left non-dominant side: Secondary | ICD-10-CM | POA: Diagnosis not present

## 2016-07-15 DIAGNOSIS — M21372 Foot drop, left foot: Secondary | ICD-10-CM | POA: Diagnosis not present

## 2016-07-15 DIAGNOSIS — N390 Urinary tract infection, site not specified: Secondary | ICD-10-CM | POA: Diagnosis not present

## 2016-07-15 DIAGNOSIS — I110 Hypertensive heart disease with heart failure: Secondary | ICD-10-CM | POA: Diagnosis not present

## 2016-07-16 DIAGNOSIS — J209 Acute bronchitis, unspecified: Secondary | ICD-10-CM | POA: Diagnosis not present

## 2016-07-16 DIAGNOSIS — M21372 Foot drop, left foot: Secondary | ICD-10-CM | POA: Diagnosis not present

## 2016-07-16 DIAGNOSIS — N390 Urinary tract infection, site not specified: Secondary | ICD-10-CM | POA: Diagnosis not present

## 2016-07-16 DIAGNOSIS — I69354 Hemiplegia and hemiparesis following cerebral infarction affecting left non-dominant side: Secondary | ICD-10-CM | POA: Diagnosis not present

## 2016-07-16 DIAGNOSIS — B952 Enterococcus as the cause of diseases classified elsewhere: Secondary | ICD-10-CM | POA: Diagnosis not present

## 2016-07-16 DIAGNOSIS — I110 Hypertensive heart disease with heart failure: Secondary | ICD-10-CM | POA: Diagnosis not present

## 2016-07-17 DIAGNOSIS — J209 Acute bronchitis, unspecified: Secondary | ICD-10-CM | POA: Diagnosis not present

## 2016-07-17 DIAGNOSIS — I110 Hypertensive heart disease with heart failure: Secondary | ICD-10-CM | POA: Diagnosis not present

## 2016-07-17 DIAGNOSIS — M21372 Foot drop, left foot: Secondary | ICD-10-CM | POA: Diagnosis not present

## 2016-07-17 DIAGNOSIS — B952 Enterococcus as the cause of diseases classified elsewhere: Secondary | ICD-10-CM | POA: Diagnosis not present

## 2016-07-17 DIAGNOSIS — I69354 Hemiplegia and hemiparesis following cerebral infarction affecting left non-dominant side: Secondary | ICD-10-CM | POA: Diagnosis not present

## 2016-07-17 DIAGNOSIS — N390 Urinary tract infection, site not specified: Secondary | ICD-10-CM | POA: Diagnosis not present

## 2016-07-20 DIAGNOSIS — B952 Enterococcus as the cause of diseases classified elsewhere: Secondary | ICD-10-CM | POA: Diagnosis not present

## 2016-07-20 DIAGNOSIS — I69354 Hemiplegia and hemiparesis following cerebral infarction affecting left non-dominant side: Secondary | ICD-10-CM | POA: Diagnosis not present

## 2016-07-20 DIAGNOSIS — J209 Acute bronchitis, unspecified: Secondary | ICD-10-CM | POA: Diagnosis not present

## 2016-07-20 DIAGNOSIS — M21372 Foot drop, left foot: Secondary | ICD-10-CM | POA: Diagnosis not present

## 2016-07-20 DIAGNOSIS — N390 Urinary tract infection, site not specified: Secondary | ICD-10-CM | POA: Diagnosis not present

## 2016-07-20 DIAGNOSIS — I110 Hypertensive heart disease with heart failure: Secondary | ICD-10-CM | POA: Diagnosis not present

## 2016-07-21 ENCOUNTER — Ambulatory Visit (INDEPENDENT_AMBULATORY_CARE_PROVIDER_SITE_OTHER): Payer: Medicare Other | Admitting: Cardiovascular Disease

## 2016-07-21 ENCOUNTER — Encounter: Payer: Self-pay | Admitting: Cardiovascular Disease

## 2016-07-21 VITALS — BP 128/68 | HR 59 | Ht 61.0 in | Wt 172.8 lb

## 2016-07-21 DIAGNOSIS — I10 Essential (primary) hypertension: Secondary | ICD-10-CM | POA: Diagnosis not present

## 2016-07-21 DIAGNOSIS — I25708 Atherosclerosis of coronary artery bypass graft(s), unspecified, with other forms of angina pectoris: Secondary | ICD-10-CM | POA: Diagnosis not present

## 2016-07-21 DIAGNOSIS — I6350 Cerebral infarction due to unspecified occlusion or stenosis of unspecified cerebral artery: Secondary | ICD-10-CM | POA: Diagnosis not present

## 2016-07-21 DIAGNOSIS — I119 Hypertensive heart disease without heart failure: Secondary | ICD-10-CM | POA: Diagnosis not present

## 2016-07-21 DIAGNOSIS — M21372 Foot drop, left foot: Secondary | ICD-10-CM | POA: Diagnosis not present

## 2016-07-21 DIAGNOSIS — I110 Hypertensive heart disease with heart failure: Secondary | ICD-10-CM | POA: Diagnosis not present

## 2016-07-21 DIAGNOSIS — B952 Enterococcus as the cause of diseases classified elsewhere: Secondary | ICD-10-CM | POA: Diagnosis not present

## 2016-07-21 DIAGNOSIS — I209 Angina pectoris, unspecified: Secondary | ICD-10-CM

## 2016-07-21 DIAGNOSIS — E7849 Other hyperlipidemia: Secondary | ICD-10-CM

## 2016-07-21 DIAGNOSIS — I69354 Hemiplegia and hemiparesis following cerebral infarction affecting left non-dominant side: Secondary | ICD-10-CM | POA: Diagnosis not present

## 2016-07-21 DIAGNOSIS — E784 Other hyperlipidemia: Secondary | ICD-10-CM

## 2016-07-21 DIAGNOSIS — J209 Acute bronchitis, unspecified: Secondary | ICD-10-CM | POA: Diagnosis not present

## 2016-07-21 DIAGNOSIS — N390 Urinary tract infection, site not specified: Secondary | ICD-10-CM | POA: Diagnosis not present

## 2016-07-21 NOTE — Patient Instructions (Signed)
Dr Croitoru recommends that you schedule a follow-up appointment in 6 months. You will receive a reminder letter in the mail two months in advance. If you don't receive a letter, please call our office to schedule the follow-up appointment.  If you need a refill on your cardiac medications before your next appointment, please call your pharmacy. 

## 2016-07-21 NOTE — Progress Notes (Signed)
Patient ID: Gina Bowers, female   DOB: 03-24-1926, 83 y.o.   MRN: [id number] Patient ID: Gina Bowers, female   DOB: August 01, 1925, 83 y.o.   MRN: [id number]    Cardiology Office Note    Date:  07/22/2016   ID:  Gina Bowers, DOB 1925-11-27, MRN [id number]  PCP:  Irven Shelling, MD  Cardiologist:   Sanda Klein, MD   Chief Complaint  Patient presents with  . Follow-up    pt c/o DOE    History of Present Illness:  Gina Bowers is a 83 y.o. female an extensive history of coronary and peripheral arterial disease who presents for follow-up.  She has been hospitalized twice in rapid succession with pneumonia, complicated by transient encephalopathy. Had RSV and UTI enterococcus. Had an echocardiogram during that hospitalization with normal LVEF, estimated sPAP 54 mm Hg.  She spends most of the day and noted chair or bed, but is able to use a walker to get to the bathroom. She is getting physical therapy. She is staying with her daughter.  She continues to take aspirin and Plavix and high-dose statin, low-dose long-acting nitrates and beta blockers, dose limited by bradycardia.  May 2017 cardiac catheterization showed no significant change in her coronary anatomy. The stent in the right coronary artery is widely patent. The mammary artery to the LAD and SVG to diagonal bypasses are open. There is a long segment of 90% stenosis in the oblique marginal artery that was felt best managed by medical therapy. Left ventricular end-diastolic pressure was only 7 mmHg.        Her echo was unchanged: - Left ventricle: The cavity size was normal. Systolic function was  normal. The estimated ejection fraction was in the range of 60%  to 65%. Wall motion was normal; there were no regional wall  motion abnormalities. There was an increased relative  contribution of atrial contraction to ventricular filling.  Doppler parameters are consistent with abnormal left ventricular  relaxation  (grade 1 diastolic dysfunction).  In 2009 she underwent four-vessel bypass surgery. In 2012 she had chest pain while in [facility] and cardiac catheterization showed occlusion of the SVG to RCA and a stent was placed in the 95% stenosis of the native RCA. There was also 90% stenosis of the left main coronary artery with a patent LIMA to the LAD and a patent SVG to the diagonal. There is no mention of the SVG to OM (OM is described as a moderate size vessel with a 75% proximal stenosis). A nuclear stress test in 2013 was normal and she has normal left ventricular systolic function by echo as well. Echo in 2013 showed evidence of diastolic dysfunction as well as elevated filling pressures although congestive heart failure has not been a clinically evident problem. She has a history of ischemic colitis in 2010. She had a stroke in the remote past, Possibly another one in 2013. 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 She has mild short-term memory loss.   Past Medical History:  Diagnosis Date  . Arthritis   . Blood transfusion 2007   AFTER HIP REPLACEMENT  . Carotid bruit    PT'S DAUGHTER STATES PT HAD RECENT CAROTID STUDY AND WAS TOLD NO SIGNIFICANT BLOCKAGES  . Chronic diastolic CHF (congestive heart  failure) ([place])    a. 07/2015 Ech: Ef 60-65%, Gr1 DD.  Marland Kitchen Coronary artery disease    a. 2009 CABG 4, Dr. Servando Snare (LIMA to LAD , SVG to diagonal, SVG to circumflex, SVG to PDA); b. Cath 2012 in  Recent ill,[place] patent LIMA , Patent SVG to diagonal, occluded SVG to RCA. 5x18mm BMS->native RCA; c. 09/2015 Cath: LM nl, LAD 164m, D1 100, LCX small, OM1 90/small, OM2 70, RCA 45p, patent stent, VG->dRCA 100, VG->OM1 100, VG->Diag nl, LIMA->LAD nl->Med  Rx.  . Depression   . Fracture FEB 2013   FRACTURED LEFT ANKLE--NO SURGERY--WAS IN BOOT UNTIL COUPLE WEEKS AGO  . GERD (gastroesophageal reflux disease)   . High cholesterol   . Hypothyroidism   . Ischemic colitis ([place]) 2010  . Kidney stones    IN THE PAST  . 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 [place] virus    COUPLE OF WEEKS AGO--ALL SYMTOMS RESOLVED PER PT  . Obstructive sleep apnea 2010  . Shortness of breath    AND WHEEZING AT TIMES--NO INHALERS  . Stroke ([place])    SEVERAL STROKES -TOLD SHE HAS CEREBELLUM BLOCKAGE AS RESULT OF STROKE--BUT NEUROLOGIST SAID HE DID NOT WANT TO DO ANY PROCEDURE BECAUSE OF HER AGE .  PT HAS SLIGHT LEFT FOOT DROP-DRAGS FOOT WHEN WALKING - USES WALKER  . Urinary incontinence   . UTI (lower urinary tract infection)    HX OF UTI'S-- LAST TIME COUPLE OF MONTHS AGO  . Vertigo     Past Surgical History:  Procedure Laterality Date  . ABDOMINAL HYSTERECTOMY    . APPENDECTOMY    . [place] AND 1990  . CARDIAC CATHETERIZATION N/A 10/07/2015   Procedure: Left Heart Cath and Cors/Grafts Angiography;  Surgeon: Peter M Martinique, MD;  Location: [facility];  Service: Cardiovascular;  Laterality: N/A;  . CATARACT EXTRACTION    . CORONARY ANGIOPLASTY  10/2010   WITH STENT PLACEMENT  . CORONARY ARTERY BYPASS GRAFT  2009  . CYSTOSCOPY WITH INJECTION  09/21/2011   Procedure: CYSTOSCOPY WITH INJECTION;  Surgeon: Ailene Rud, MD;  Location: [facility];  Service: Urology;  Laterality: N/A;  Peri Urethral Macroplastique Injection and Estring Placement  . HERNIA REPAIR    . JOINT REPLACEMENT  2007   HIP REPLACEMENT -LEFT  . TONSILLECTOMY      Outpatient Medications Prior to Visit  Medication Sig Dispense Refill  . acetaminophen (TYLENOL) 500 MG tablet Take 1,000 mg by mouth every 6 (six) hours as needed for mild pain or fever.     . Albuterol Sulfate (PROAIR RESPICLICK) 123XX123 (90 Base) MCG/ACT AEPB Inhale 2 puffs into the  lungs daily as needed (shortness of breath).    Marland Kitchen alendronate (FOSAMAX) 70 MG tablet Take 1 tablet (70 mg total) by mouth once a week. Take with a full glass of water on an empty stomach.mon (Patient taking differently: Take 70 mg by mouth every Monday. Take with a full glass of water on an empty stomach) 15 tablet 0  . amoxicillin (AMOXIL) 500 MG tablet Take 1 tablet (500 mg total) by mouth 3 (three) times daily. 15 tablet 0  . aspirin EC 81 MG tablet Take 81 mg by mouth every evening.     . Calcium Carb-Cholecalciferol (CALCIUM 1000 + D PO) Take 1 tablet by mouth every evening.     . clopidogrel (PLAVIX) 75 MG tablet Take 1 tablet (75 mg  total) by mouth daily. 90 tablet 3  . CRANBERRY EXTRACT PO Take 500 mg by mouth 2 (two) times daily.     . fish oil-omega-3 fatty acids 1000 MG capsule Take 1 g by mouth every evening.     Marland Kitchen ipratropium-albuterol (DUONEB) 0.5-2.5 (3) MG/3ML SOLN Take 3 mLs by nebulization every 4 (four) hours as needed. 360 mL 1  . isosorbide mononitrate (IMDUR) 30 MG 24 hr tablet Take 1 tablet (30 mg total) by mouth daily. (Patient taking differently: Take 15 mg by mouth every morning. ) 30 tablet 9  . levothyroxine (SYNTHROID, LEVOTHROID) 75 MCG tablet Take 1 tablet (75 mcg total) by mouth daily before breakfast. 90 tablet 0  . losartan (COZAAR) 25 MG tablet Take 1 tablet (25 mg total) by mouth 2 (two) times daily. 60 tablet 9  . meclizine (ANTIVERT) 25 MG tablet Take 25 mg by mouth 3 (three) times daily as needed for dizziness.    . metoprolol succinate (TOPROL-XL) 25 MG 24 hr tablet TAKE ONE TABLET BY MOUTH DAILY 30 tablet 6  . Multiple Vitamin (MULITIVITAMIN WITH MINERALS) TABS Take 1 tablet by mouth daily with breakfast.    . nitroGLYCERIN (NITROSTAT) 0.4 MG SL tablet Place 1 tablet (0.4 mg total) under the tongue every 5 (five) minutes as needed for chest pain. 25 tablet 2  . ondansetron (ZOFRAN ODT) 4 MG disintegrating tablet Take one every 6 hours for nausea (Patient  taking differently: Take 4 mg by mouth every 6 (six) hours as needed for nausea or vomiting. ) 12 tablet 0  . OVER THE COUNTER MEDICATION See admin instructions. Immuplex Capsule: Take 1 capsule by mouth 2 times a day    . pantoprazole (PROTONIX) 40 MG tablet Take 1 tablet (40 mg total) by mouth daily. 90 tablet 3  . Polyethyl Glycol-Propyl Glycol (SYSTANE OP) Place 1 drop into both eyes 3 (three) times daily. Patient states its the Gel form (soothes)    . predniSONE (DELTASONE) 10 MG tablet Take 50 mg tablet on 1/24 and taper down by 10 mg daily until completed 15 tablet 0  . Probiotic CAPS Take 1 capsule by mouth daily with breakfast.    . rosuvastatin (CRESTOR) 20 MG tablet Take 1 tablet (20 mg total) by mouth daily. (Patient taking differently: Take 20 mg by mouth every evening. ) 90 tablet 3  . sertraline (ZOLOFT) 50 MG tablet Take 1 tablet (50 mg total) by mouth daily. 135 tablet 2  . solifenacin (VESICARE) 5 MG tablet Take 1 tablet (5 mg total) by mouth daily. (Patient taking differently: Take 5 mg by mouth every evening. ) 30 tablet 3   No facility-administered medications prior to visit.      Allergies:   Sulfa antibiotics and Sulfonamide derivatives   Social History   Social History  . Marital status: Widowed    Spouse name: N/A  . Number of children: N/A  . Years of education: N/A   Social History Main Topics  . Smoking status: Never Smoker  . Smokeless tobacco: Never Used  . Alcohol use No  . Drug use: No  . Sexual activity: Not Asked   Other Topics Concern  . None   Social History Narrative  . None     Family History:  The patient's family history includes Heart disease in her mother.   ROS:   Please see the history of present illness.    ROS All other systems reviewed and are negative.   PHYSICAL EXAM:  VS:  BP 128/68   Pulse (!) 59   Ht 5\' 1"  (1.549 m)   Wt 78.4 kg (172 lb 12.8 oz)   BMI 32.65 kg/m    GEN: Well nourished, well developed, in no acute  distress  HEENT: normal  Neck: no JVD, bilateral carotid bruits, louder on the right, no masses Cardiac: RRR; no murmurs, rubs, or gallops,no edema , sternotomy scar, normal radial, ulnar and pedal pulses bilaterally, equal blood pressure in the right and left upper extremities Respiratory:  clear to auscultation bilaterally, normal work of breathing GI: soft, nontender, nondistended, + BS MS: no deformity or atrophy  Skin: warm and dry, no rash Neuro:  Alert and Oriented x 3, Strength and sensation are intact Psych: euthymic mood, full affect  Wt Readings from Last 3 Encounters:  07/21/16 78.4 kg (172 lb 12.8 oz)  06/19/16 80.6 kg (177 lb 11.1 oz)  04/20/16 78.9 kg (174 lb)      Studies/Labs Reviewed:   EKG:  EKG is ordered today.  The ekg ordered today demonstratesMild sinus bradycardia, otherwise normal tracing, QTC 447 ms  Recent Labs: 10/04/2015: ALT 22 10/08/2015: TSH 2.170 06/19/2016: BUN 14; Creatinine, Ser 0.51; Hemoglobin 10.0; Platelets 327; Potassium 3.8; Sodium 132   Lipid Panel    Component Value Date/Time   CHOL 147 08/17/2015 0648   TRIG 70 08/17/2015 0648   HDL 58 08/17/2015 0648   CHOLHDL 2.5 08/17/2015 0648   VLDL 14 08/17/2015 0648   LDLCALC 75 08/17/2015 0648      ASSESSMENT:    1. Coronary artery disease involving coronary bypass graft of native heart with other forms of angina pectoris ([place])   2. Hypertensive heart disease without heart failure   3. Essential hypertension   4. Cerebral artery occlusion with cerebral infarction ([place])   5. Other hyperlipidemia      PLAN:  In order of problems listed above:   1. CAD s/p CABG: Currently without any symptoms of angina pectoris. On appropriate antiplatelet, statin therapy and antianginal medications. Option for OM percutaneous revascularization and stent, but this was felt to be challenging and with low likelihood of long-term patency due to the small caliber and lengthy segment of stenosis in this  vessel. Considering her other numerous medical problems, I don't think this procedure would be beneficial. 2. Diastolic dysfunction by echo: No symptoms of heart failure, no clinical signs of hypervolemia. Try to avoid diuretics. 3. HTN: would avoid excessive blood pressure reduction in view of the presence of significant atherosclerotic cerebrovascular disease. Can't say for sure, but summer her falls may have been related to this. 4. ASCVD: she has severe stenosis in the vertebral basilar system with occluded left vertebral artery and left PICA, thankfully currently without any attributable symptoms. She has had a stroke in the past. 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 MRI 2014 showed remote posterior right corona radiata infarction, possibly associated with her tendency for foot drop 5. HLP: All lipid parameters are satisfactory    Medication Adjustments/Labs and Tests Ordered: Current medicines are reviewed at length with the patient today.  Concerns regarding medicines are outlined above.  Medication changes, Labs and Tests ordered today are listed in the Patient Instructions below. Patient Instructions  Dr Sallyanne Kuster recommends that you schedule a follow-up appointment in 6 months. You will receive a reminder letter in the mail two months in advance. If you don't receive  a letter, please call our office to schedule the follow-up appointment.  If you need a refill on your cardiac medications before your next appointment, please call your pharmacy.      Signed, Sanda Klein, MD  07/22/2016 5:21 PM    [address], [place], [place]  [address] Phone: [phone]; Fax: [phone]

## 2016-07-22 DIAGNOSIS — I69354 Hemiplegia and hemiparesis following cerebral infarction affecting left non-dominant side: Secondary | ICD-10-CM | POA: Diagnosis not present

## 2016-07-22 DIAGNOSIS — J209 Acute bronchitis, unspecified: Secondary | ICD-10-CM | POA: Diagnosis not present

## 2016-07-22 DIAGNOSIS — B952 Enterococcus as the cause of diseases classified elsewhere: Secondary | ICD-10-CM | POA: Diagnosis not present

## 2016-07-22 DIAGNOSIS — I110 Hypertensive heart disease with heart failure: Secondary | ICD-10-CM | POA: Diagnosis not present

## 2016-07-22 DIAGNOSIS — M21372 Foot drop, left foot: Secondary | ICD-10-CM | POA: Diagnosis not present

## 2016-07-22 DIAGNOSIS — N390 Urinary tract infection, site not specified: Secondary | ICD-10-CM | POA: Diagnosis not present

## 2016-07-23 DIAGNOSIS — R531 Weakness: Secondary | ICD-10-CM | POA: Diagnosis not present

## 2016-07-23 DIAGNOSIS — R350 Frequency of micturition: Secondary | ICD-10-CM | POA: Diagnosis not present

## 2016-07-23 DIAGNOSIS — M21372 Foot drop, left foot: Secondary | ICD-10-CM | POA: Diagnosis not present

## 2016-07-23 DIAGNOSIS — I69354 Hemiplegia and hemiparesis following cerebral infarction affecting left non-dominant side: Secondary | ICD-10-CM | POA: Diagnosis not present

## 2016-07-23 DIAGNOSIS — B952 Enterococcus as the cause of diseases classified elsewhere: Secondary | ICD-10-CM | POA: Diagnosis not present

## 2016-07-23 DIAGNOSIS — I110 Hypertensive heart disease with heart failure: Secondary | ICD-10-CM | POA: Diagnosis not present

## 2016-07-23 DIAGNOSIS — I10 Essential (primary) hypertension: Secondary | ICD-10-CM | POA: Diagnosis not present

## 2016-07-23 DIAGNOSIS — R1312 Dysphagia, oropharyngeal phase: Secondary | ICD-10-CM | POA: Diagnosis not present

## 2016-07-23 DIAGNOSIS — N390 Urinary tract infection, site not specified: Secondary | ICD-10-CM | POA: Diagnosis not present

## 2016-07-23 DIAGNOSIS — Z8701 Personal history of pneumonia (recurrent): Secondary | ICD-10-CM | POA: Diagnosis not present

## 2016-07-23 DIAGNOSIS — J209 Acute bronchitis, unspecified: Secondary | ICD-10-CM | POA: Diagnosis not present

## 2016-07-24 DIAGNOSIS — N390 Urinary tract infection, site not specified: Secondary | ICD-10-CM | POA: Diagnosis not present

## 2016-07-24 DIAGNOSIS — B952 Enterococcus as the cause of diseases classified elsewhere: Secondary | ICD-10-CM | POA: Diagnosis not present

## 2016-07-24 DIAGNOSIS — I69354 Hemiplegia and hemiparesis following cerebral infarction affecting left non-dominant side: Secondary | ICD-10-CM | POA: Diagnosis not present

## 2016-07-24 DIAGNOSIS — M21372 Foot drop, left foot: Secondary | ICD-10-CM | POA: Diagnosis not present

## 2016-07-24 DIAGNOSIS — J209 Acute bronchitis, unspecified: Secondary | ICD-10-CM | POA: Diagnosis not present

## 2016-07-24 DIAGNOSIS — I110 Hypertensive heart disease with heart failure: Secondary | ICD-10-CM | POA: Diagnosis not present

## 2016-07-27 DIAGNOSIS — I110 Hypertensive heart disease with heart failure: Secondary | ICD-10-CM | POA: Diagnosis not present

## 2016-07-27 DIAGNOSIS — B952 Enterococcus as the cause of diseases classified elsewhere: Secondary | ICD-10-CM | POA: Diagnosis not present

## 2016-07-27 DIAGNOSIS — M21372 Foot drop, left foot: Secondary | ICD-10-CM | POA: Diagnosis not present

## 2016-07-27 DIAGNOSIS — I69354 Hemiplegia and hemiparesis following cerebral infarction affecting left non-dominant side: Secondary | ICD-10-CM | POA: Diagnosis not present

## 2016-07-27 DIAGNOSIS — J209 Acute bronchitis, unspecified: Secondary | ICD-10-CM | POA: Diagnosis not present

## 2016-07-27 DIAGNOSIS — N390 Urinary tract infection, site not specified: Secondary | ICD-10-CM | POA: Diagnosis not present

## 2016-07-30 DIAGNOSIS — B952 Enterococcus as the cause of diseases classified elsewhere: Secondary | ICD-10-CM | POA: Diagnosis not present

## 2016-07-30 DIAGNOSIS — N390 Urinary tract infection, site not specified: Secondary | ICD-10-CM | POA: Diagnosis not present

## 2016-07-30 DIAGNOSIS — I110 Hypertensive heart disease with heart failure: Secondary | ICD-10-CM | POA: Diagnosis not present

## 2016-07-30 DIAGNOSIS — M21372 Foot drop, left foot: Secondary | ICD-10-CM | POA: Diagnosis not present

## 2016-07-30 DIAGNOSIS — I69354 Hemiplegia and hemiparesis following cerebral infarction affecting left non-dominant side: Secondary | ICD-10-CM | POA: Diagnosis not present

## 2016-07-30 DIAGNOSIS — J209 Acute bronchitis, unspecified: Secondary | ICD-10-CM | POA: Diagnosis not present

## 2016-07-31 DIAGNOSIS — J209 Acute bronchitis, unspecified: Secondary | ICD-10-CM | POA: Diagnosis not present

## 2016-07-31 DIAGNOSIS — I110 Hypertensive heart disease with heart failure: Secondary | ICD-10-CM | POA: Diagnosis not present

## 2016-07-31 DIAGNOSIS — B952 Enterococcus as the cause of diseases classified elsewhere: Secondary | ICD-10-CM | POA: Diagnosis not present

## 2016-07-31 DIAGNOSIS — N390 Urinary tract infection, site not specified: Secondary | ICD-10-CM | POA: Diagnosis not present

## 2016-07-31 DIAGNOSIS — I69354 Hemiplegia and hemiparesis following cerebral infarction affecting left non-dominant side: Secondary | ICD-10-CM | POA: Diagnosis not present

## 2016-07-31 DIAGNOSIS — M21372 Foot drop, left foot: Secondary | ICD-10-CM | POA: Diagnosis not present

## 2016-08-04 DIAGNOSIS — I69354 Hemiplegia and hemiparesis following cerebral infarction affecting left non-dominant side: Secondary | ICD-10-CM | POA: Diagnosis not present

## 2016-08-04 DIAGNOSIS — M21372 Foot drop, left foot: Secondary | ICD-10-CM | POA: Diagnosis not present

## 2016-08-04 DIAGNOSIS — I110 Hypertensive heart disease with heart failure: Secondary | ICD-10-CM | POA: Diagnosis not present

## 2016-08-04 DIAGNOSIS — N390 Urinary tract infection, site not specified: Secondary | ICD-10-CM | POA: Diagnosis not present

## 2016-08-04 DIAGNOSIS — B952 Enterococcus as the cause of diseases classified elsewhere: Secondary | ICD-10-CM | POA: Diagnosis not present

## 2016-08-04 DIAGNOSIS — J209 Acute bronchitis, unspecified: Secondary | ICD-10-CM | POA: Diagnosis not present

## 2016-08-06 DIAGNOSIS — I110 Hypertensive heart disease with heart failure: Secondary | ICD-10-CM | POA: Diagnosis not present

## 2016-08-06 DIAGNOSIS — B952 Enterococcus as the cause of diseases classified elsewhere: Secondary | ICD-10-CM | POA: Diagnosis not present

## 2016-08-06 DIAGNOSIS — J209 Acute bronchitis, unspecified: Secondary | ICD-10-CM | POA: Diagnosis not present

## 2016-08-06 DIAGNOSIS — I69354 Hemiplegia and hemiparesis following cerebral infarction affecting left non-dominant side: Secondary | ICD-10-CM | POA: Diagnosis not present

## 2016-08-06 DIAGNOSIS — M21372 Foot drop, left foot: Secondary | ICD-10-CM | POA: Diagnosis not present

## 2016-08-06 DIAGNOSIS — N390 Urinary tract infection, site not specified: Secondary | ICD-10-CM | POA: Diagnosis not present

## 2016-08-10 DIAGNOSIS — N390 Urinary tract infection, site not specified: Secondary | ICD-10-CM | POA: Diagnosis not present

## 2016-08-10 DIAGNOSIS — I69354 Hemiplegia and hemiparesis following cerebral infarction affecting left non-dominant side: Secondary | ICD-10-CM | POA: Diagnosis not present

## 2016-08-10 DIAGNOSIS — B952 Enterococcus as the cause of diseases classified elsewhere: Secondary | ICD-10-CM | POA: Diagnosis not present

## 2016-08-10 DIAGNOSIS — M21372 Foot drop, left foot: Secondary | ICD-10-CM | POA: Diagnosis not present

## 2016-08-10 DIAGNOSIS — J209 Acute bronchitis, unspecified: Secondary | ICD-10-CM | POA: Diagnosis not present

## 2016-08-10 DIAGNOSIS — I110 Hypertensive heart disease with heart failure: Secondary | ICD-10-CM | POA: Diagnosis not present

## 2016-08-11 DIAGNOSIS — M21372 Foot drop, left foot: Secondary | ICD-10-CM | POA: Diagnosis not present

## 2016-08-11 DIAGNOSIS — B952 Enterococcus as the cause of diseases classified elsewhere: Secondary | ICD-10-CM | POA: Diagnosis not present

## 2016-08-11 DIAGNOSIS — I110 Hypertensive heart disease with heart failure: Secondary | ICD-10-CM | POA: Diagnosis not present

## 2016-08-11 DIAGNOSIS — I69354 Hemiplegia and hemiparesis following cerebral infarction affecting left non-dominant side: Secondary | ICD-10-CM | POA: Diagnosis not present

## 2016-08-11 DIAGNOSIS — N390 Urinary tract infection, site not specified: Secondary | ICD-10-CM | POA: Diagnosis not present

## 2016-08-11 DIAGNOSIS — J209 Acute bronchitis, unspecified: Secondary | ICD-10-CM | POA: Diagnosis not present

## 2016-08-12 DIAGNOSIS — N390 Urinary tract infection, site not specified: Secondary | ICD-10-CM | POA: Diagnosis not present

## 2016-08-12 DIAGNOSIS — M21372 Foot drop, left foot: Secondary | ICD-10-CM | POA: Diagnosis not present

## 2016-08-12 DIAGNOSIS — I69354 Hemiplegia and hemiparesis following cerebral infarction affecting left non-dominant side: Secondary | ICD-10-CM | POA: Diagnosis not present

## 2016-08-12 DIAGNOSIS — I110 Hypertensive heart disease with heart failure: Secondary | ICD-10-CM | POA: Diagnosis not present

## 2016-08-12 DIAGNOSIS — J209 Acute bronchitis, unspecified: Secondary | ICD-10-CM | POA: Diagnosis not present

## 2016-08-12 DIAGNOSIS — B952 Enterococcus as the cause of diseases classified elsewhere: Secondary | ICD-10-CM | POA: Diagnosis not present

## 2016-08-13 DIAGNOSIS — N390 Urinary tract infection, site not specified: Secondary | ICD-10-CM | POA: Diagnosis not present

## 2016-08-13 DIAGNOSIS — I110 Hypertensive heart disease with heart failure: Secondary | ICD-10-CM | POA: Diagnosis not present

## 2016-08-13 DIAGNOSIS — M21372 Foot drop, left foot: Secondary | ICD-10-CM | POA: Diagnosis not present

## 2016-08-13 DIAGNOSIS — B952 Enterococcus as the cause of diseases classified elsewhere: Secondary | ICD-10-CM | POA: Diagnosis not present

## 2016-08-13 DIAGNOSIS — I69354 Hemiplegia and hemiparesis following cerebral infarction affecting left non-dominant side: Secondary | ICD-10-CM | POA: Diagnosis not present

## 2016-08-13 DIAGNOSIS — J209 Acute bronchitis, unspecified: Secondary | ICD-10-CM | POA: Diagnosis not present

## 2016-08-17 DIAGNOSIS — I69354 Hemiplegia and hemiparesis following cerebral infarction affecting left non-dominant side: Secondary | ICD-10-CM | POA: Diagnosis not present

## 2016-08-17 DIAGNOSIS — I110 Hypertensive heart disease with heart failure: Secondary | ICD-10-CM | POA: Diagnosis not present

## 2016-08-17 DIAGNOSIS — N390 Urinary tract infection, site not specified: Secondary | ICD-10-CM | POA: Diagnosis not present

## 2016-08-17 DIAGNOSIS — J209 Acute bronchitis, unspecified: Secondary | ICD-10-CM | POA: Diagnosis not present

## 2016-08-17 DIAGNOSIS — M21372 Foot drop, left foot: Secondary | ICD-10-CM | POA: Diagnosis not present

## 2016-08-17 DIAGNOSIS — B952 Enterococcus as the cause of diseases classified elsewhere: Secondary | ICD-10-CM | POA: Diagnosis not present

## 2016-08-18 DIAGNOSIS — I69354 Hemiplegia and hemiparesis following cerebral infarction affecting left non-dominant side: Secondary | ICD-10-CM | POA: Diagnosis not present

## 2016-08-18 DIAGNOSIS — N390 Urinary tract infection, site not specified: Secondary | ICD-10-CM | POA: Diagnosis not present

## 2016-08-18 DIAGNOSIS — J209 Acute bronchitis, unspecified: Secondary | ICD-10-CM | POA: Diagnosis not present

## 2016-08-18 DIAGNOSIS — M21372 Foot drop, left foot: Secondary | ICD-10-CM | POA: Diagnosis not present

## 2016-08-18 DIAGNOSIS — B952 Enterococcus as the cause of diseases classified elsewhere: Secondary | ICD-10-CM | POA: Diagnosis not present

## 2016-08-18 DIAGNOSIS — I110 Hypertensive heart disease with heart failure: Secondary | ICD-10-CM | POA: Diagnosis not present

## 2016-09-04 DIAGNOSIS — R399 Unspecified symptoms and signs involving the genitourinary system: Secondary | ICD-10-CM | POA: Diagnosis not present

## 2016-09-07 ENCOUNTER — Ambulatory Visit: Payer: Medicare Other | Admitting: Neurology

## 2016-10-26 DIAGNOSIS — R531 Weakness: Secondary | ICD-10-CM | POA: Diagnosis not present

## 2016-10-26 DIAGNOSIS — I2510 Atherosclerotic heart disease of native coronary artery without angina pectoris: Secondary | ICD-10-CM | POA: Diagnosis not present

## 2016-10-26 DIAGNOSIS — M21372 Foot drop, left foot: Secondary | ICD-10-CM | POA: Diagnosis not present

## 2016-10-26 DIAGNOSIS — N390 Urinary tract infection, site not specified: Secondary | ICD-10-CM | POA: Diagnosis not present

## 2016-10-26 DIAGNOSIS — I69354 Hemiplegia and hemiparesis following cerebral infarction affecting left non-dominant side: Secondary | ICD-10-CM | POA: Diagnosis not present

## 2016-10-26 DIAGNOSIS — I509 Heart failure, unspecified: Secondary | ICD-10-CM | POA: Diagnosis not present

## 2016-10-26 DIAGNOSIS — Z951 Presence of aortocoronary bypass graft: Secondary | ICD-10-CM | POA: Diagnosis not present

## 2016-10-26 DIAGNOSIS — Z7901 Long term (current) use of anticoagulants: Secondary | ICD-10-CM | POA: Diagnosis not present

## 2016-10-29 DIAGNOSIS — M21372 Foot drop, left foot: Secondary | ICD-10-CM | POA: Diagnosis not present

## 2016-10-29 DIAGNOSIS — R531 Weakness: Secondary | ICD-10-CM | POA: Diagnosis not present

## 2016-10-29 DIAGNOSIS — I2510 Atherosclerotic heart disease of native coronary artery without angina pectoris: Secondary | ICD-10-CM | POA: Diagnosis not present

## 2016-10-29 DIAGNOSIS — I69354 Hemiplegia and hemiparesis following cerebral infarction affecting left non-dominant side: Secondary | ICD-10-CM | POA: Diagnosis not present

## 2016-10-29 DIAGNOSIS — N390 Urinary tract infection, site not specified: Secondary | ICD-10-CM | POA: Diagnosis not present

## 2016-10-29 DIAGNOSIS — I509 Heart failure, unspecified: Secondary | ICD-10-CM | POA: Diagnosis not present

## 2016-10-30 DIAGNOSIS — K219 Gastro-esophageal reflux disease without esophagitis: Secondary | ICD-10-CM | POA: Diagnosis not present

## 2016-10-30 DIAGNOSIS — R5381 Other malaise: Secondary | ICD-10-CM | POA: Diagnosis not present

## 2016-10-30 DIAGNOSIS — I10 Essential (primary) hypertension: Secondary | ICD-10-CM | POA: Diagnosis not present

## 2016-11-02 DIAGNOSIS — N390 Urinary tract infection, site not specified: Secondary | ICD-10-CM | POA: Diagnosis not present

## 2016-11-02 DIAGNOSIS — I69354 Hemiplegia and hemiparesis following cerebral infarction affecting left non-dominant side: Secondary | ICD-10-CM | POA: Diagnosis not present

## 2016-11-02 DIAGNOSIS — R531 Weakness: Secondary | ICD-10-CM | POA: Diagnosis not present

## 2016-11-02 DIAGNOSIS — I509 Heart failure, unspecified: Secondary | ICD-10-CM | POA: Diagnosis not present

## 2016-11-02 DIAGNOSIS — M21372 Foot drop, left foot: Secondary | ICD-10-CM | POA: Diagnosis not present

## 2016-11-02 DIAGNOSIS — I2510 Atherosclerotic heart disease of native coronary artery without angina pectoris: Secondary | ICD-10-CM | POA: Diagnosis not present

## 2016-11-05 DIAGNOSIS — I2510 Atherosclerotic heart disease of native coronary artery without angina pectoris: Secondary | ICD-10-CM | POA: Diagnosis not present

## 2016-11-05 DIAGNOSIS — M21372 Foot drop, left foot: Secondary | ICD-10-CM | POA: Diagnosis not present

## 2016-11-05 DIAGNOSIS — I509 Heart failure, unspecified: Secondary | ICD-10-CM | POA: Diagnosis not present

## 2016-11-05 DIAGNOSIS — I69354 Hemiplegia and hemiparesis following cerebral infarction affecting left non-dominant side: Secondary | ICD-10-CM | POA: Diagnosis not present

## 2016-11-05 DIAGNOSIS — R531 Weakness: Secondary | ICD-10-CM | POA: Diagnosis not present

## 2016-11-05 DIAGNOSIS — N390 Urinary tract infection, site not specified: Secondary | ICD-10-CM | POA: Diagnosis not present

## 2016-11-09 DIAGNOSIS — N390 Urinary tract infection, site not specified: Secondary | ICD-10-CM | POA: Diagnosis not present

## 2016-11-09 DIAGNOSIS — M21372 Foot drop, left foot: Secondary | ICD-10-CM | POA: Diagnosis not present

## 2016-11-09 DIAGNOSIS — I509 Heart failure, unspecified: Secondary | ICD-10-CM | POA: Diagnosis not present

## 2016-11-09 DIAGNOSIS — I2510 Atherosclerotic heart disease of native coronary artery without angina pectoris: Secondary | ICD-10-CM | POA: Diagnosis not present

## 2016-11-09 DIAGNOSIS — R531 Weakness: Secondary | ICD-10-CM | POA: Diagnosis not present

## 2016-11-09 DIAGNOSIS — I69354 Hemiplegia and hemiparesis following cerebral infarction affecting left non-dominant side: Secondary | ICD-10-CM | POA: Diagnosis not present

## 2016-11-13 DIAGNOSIS — N390 Urinary tract infection, site not specified: Secondary | ICD-10-CM | POA: Diagnosis not present

## 2016-11-13 DIAGNOSIS — R531 Weakness: Secondary | ICD-10-CM | POA: Diagnosis not present

## 2016-11-13 DIAGNOSIS — I2510 Atherosclerotic heart disease of native coronary artery without angina pectoris: Secondary | ICD-10-CM | POA: Diagnosis not present

## 2016-11-13 DIAGNOSIS — I69354 Hemiplegia and hemiparesis following cerebral infarction affecting left non-dominant side: Secondary | ICD-10-CM | POA: Diagnosis not present

## 2016-11-13 DIAGNOSIS — I509 Heart failure, unspecified: Secondary | ICD-10-CM | POA: Diagnosis not present

## 2016-11-13 DIAGNOSIS — M21372 Foot drop, left foot: Secondary | ICD-10-CM | POA: Diagnosis not present

## 2016-11-14 ENCOUNTER — Other Ambulatory Visit: Payer: Self-pay | Admitting: Cardiovascular Disease

## 2016-11-16 DIAGNOSIS — I69354 Hemiplegia and hemiparesis following cerebral infarction affecting left non-dominant side: Secondary | ICD-10-CM | POA: Diagnosis not present

## 2016-11-16 DIAGNOSIS — I2510 Atherosclerotic heart disease of native coronary artery without angina pectoris: Secondary | ICD-10-CM | POA: Diagnosis not present

## 2016-11-16 DIAGNOSIS — N390 Urinary tract infection, site not specified: Secondary | ICD-10-CM | POA: Diagnosis not present

## 2016-11-16 DIAGNOSIS — I509 Heart failure, unspecified: Secondary | ICD-10-CM | POA: Diagnosis not present

## 2016-11-16 DIAGNOSIS — R531 Weakness: Secondary | ICD-10-CM | POA: Diagnosis not present

## 2016-11-16 DIAGNOSIS — M21372 Foot drop, left foot: Secondary | ICD-10-CM | POA: Diagnosis not present

## 2016-11-16 NOTE — Telephone Encounter (Signed)
Rx(s) sent to pharmacy electronically.  

## 2016-11-18 DIAGNOSIS — I509 Heart failure, unspecified: Secondary | ICD-10-CM | POA: Diagnosis not present

## 2016-11-18 DIAGNOSIS — M21372 Foot drop, left foot: Secondary | ICD-10-CM | POA: Diagnosis not present

## 2016-11-18 DIAGNOSIS — R531 Weakness: Secondary | ICD-10-CM | POA: Diagnosis not present

## 2016-11-18 DIAGNOSIS — I2510 Atherosclerotic heart disease of native coronary artery without angina pectoris: Secondary | ICD-10-CM | POA: Diagnosis not present

## 2016-11-18 DIAGNOSIS — I69354 Hemiplegia and hemiparesis following cerebral infarction affecting left non-dominant side: Secondary | ICD-10-CM | POA: Diagnosis not present

## 2016-11-18 DIAGNOSIS — N390 Urinary tract infection, site not specified: Secondary | ICD-10-CM | POA: Diagnosis not present

## 2016-11-24 DIAGNOSIS — I69354 Hemiplegia and hemiparesis following cerebral infarction affecting left non-dominant side: Secondary | ICD-10-CM | POA: Diagnosis not present

## 2016-11-24 DIAGNOSIS — I509 Heart failure, unspecified: Secondary | ICD-10-CM | POA: Diagnosis not present

## 2016-11-24 DIAGNOSIS — I2510 Atherosclerotic heart disease of native coronary artery without angina pectoris: Secondary | ICD-10-CM | POA: Diagnosis not present

## 2016-11-24 DIAGNOSIS — N390 Urinary tract infection, site not specified: Secondary | ICD-10-CM | POA: Diagnosis not present

## 2016-11-24 DIAGNOSIS — R531 Weakness: Secondary | ICD-10-CM | POA: Diagnosis not present

## 2016-11-24 DIAGNOSIS — M21372 Foot drop, left foot: Secondary | ICD-10-CM | POA: Diagnosis not present

## 2016-11-26 DIAGNOSIS — I509 Heart failure, unspecified: Secondary | ICD-10-CM | POA: Diagnosis not present

## 2016-11-26 DIAGNOSIS — R531 Weakness: Secondary | ICD-10-CM | POA: Diagnosis not present

## 2016-11-26 DIAGNOSIS — I2510 Atherosclerotic heart disease of native coronary artery without angina pectoris: Secondary | ICD-10-CM | POA: Diagnosis not present

## 2016-11-26 DIAGNOSIS — I69354 Hemiplegia and hemiparesis following cerebral infarction affecting left non-dominant side: Secondary | ICD-10-CM | POA: Diagnosis not present

## 2016-11-26 DIAGNOSIS — N390 Urinary tract infection, site not specified: Secondary | ICD-10-CM | POA: Diagnosis not present

## 2016-11-26 DIAGNOSIS — M21372 Foot drop, left foot: Secondary | ICD-10-CM | POA: Diagnosis not present

## 2016-12-01 ENCOUNTER — Ambulatory Visit: Payer: Medicare Other | Admitting: Neurology

## 2016-12-01 DIAGNOSIS — M21372 Foot drop, left foot: Secondary | ICD-10-CM | POA: Diagnosis not present

## 2016-12-01 DIAGNOSIS — R531 Weakness: Secondary | ICD-10-CM | POA: Diagnosis not present

## 2016-12-01 DIAGNOSIS — I2510 Atherosclerotic heart disease of native coronary artery without angina pectoris: Secondary | ICD-10-CM | POA: Diagnosis not present

## 2016-12-01 DIAGNOSIS — N390 Urinary tract infection, site not specified: Secondary | ICD-10-CM | POA: Diagnosis not present

## 2016-12-01 DIAGNOSIS — I69354 Hemiplegia and hemiparesis following cerebral infarction affecting left non-dominant side: Secondary | ICD-10-CM | POA: Diagnosis not present

## 2016-12-01 DIAGNOSIS — I509 Heart failure, unspecified: Secondary | ICD-10-CM | POA: Diagnosis not present

## 2016-12-01 DIAGNOSIS — N3000 Acute cystitis without hematuria: Secondary | ICD-10-CM | POA: Diagnosis not present

## 2016-12-02 DIAGNOSIS — I2510 Atherosclerotic heart disease of native coronary artery without angina pectoris: Secondary | ICD-10-CM | POA: Diagnosis not present

## 2016-12-02 DIAGNOSIS — I69354 Hemiplegia and hemiparesis following cerebral infarction affecting left non-dominant side: Secondary | ICD-10-CM | POA: Diagnosis not present

## 2016-12-02 DIAGNOSIS — I509 Heart failure, unspecified: Secondary | ICD-10-CM | POA: Diagnosis not present

## 2016-12-02 DIAGNOSIS — R531 Weakness: Secondary | ICD-10-CM | POA: Diagnosis not present

## 2016-12-02 DIAGNOSIS — N390 Urinary tract infection, site not specified: Secondary | ICD-10-CM | POA: Diagnosis not present

## 2016-12-02 DIAGNOSIS — M21372 Foot drop, left foot: Secondary | ICD-10-CM | POA: Diagnosis not present

## 2016-12-11 ENCOUNTER — Other Ambulatory Visit: Payer: Self-pay | Admitting: Cardiology

## 2016-12-22 ENCOUNTER — Other Ambulatory Visit: Payer: Self-pay | Admitting: Cardiovascular Disease

## 2017-01-06 ENCOUNTER — Other Ambulatory Visit: Payer: Self-pay | Admitting: Physician Assistant

## 2017-01-06 NOTE — Telephone Encounter (Signed)
Please review for refill. Thanks!  

## 2017-01-21 DIAGNOSIS — N3000 Acute cystitis without hematuria: Secondary | ICD-10-CM | POA: Diagnosis not present

## 2017-02-03 DIAGNOSIS — R399 Unspecified symptoms and signs involving the genitourinary system: Secondary | ICD-10-CM | POA: Diagnosis not present

## 2017-02-17 DIAGNOSIS — Z23 Encounter for immunization: Secondary | ICD-10-CM | POA: Diagnosis not present

## 2017-03-11 DIAGNOSIS — H35373 Puckering of macula, bilateral: Secondary | ICD-10-CM | POA: Diagnosis not present

## 2017-03-16 ENCOUNTER — Encounter (HOSPITAL_COMMUNITY): Payer: Self-pay | Admitting: Emergency Medicine

## 2017-03-16 ENCOUNTER — Emergency Department (HOSPITAL_COMMUNITY)
Admission: EM | Admit: 2017-03-16 | Discharge: 2017-03-16 | Disposition: A | Discharge status: Home / Self Care | Payer: Medicare Other | Attending: Emergency Medicine | Admitting: Emergency Medicine

## 2017-03-16 ENCOUNTER — Emergency Department (HOSPITAL_COMMUNITY): Payer: Medicare Other

## 2017-03-16 DIAGNOSIS — E039 Hypothyroidism, unspecified: Secondary | ICD-10-CM | POA: Insufficient documentation

## 2017-03-16 DIAGNOSIS — Z79899 Other long term (current) drug therapy: Secondary | ICD-10-CM | POA: Diagnosis not present

## 2017-03-16 DIAGNOSIS — Z7982 Long term (current) use of aspirin: Secondary | ICD-10-CM | POA: Diagnosis not present

## 2017-03-16 DIAGNOSIS — I5032 Chronic diastolic (congestive) heart failure: Secondary | ICD-10-CM | POA: Insufficient documentation

## 2017-03-16 DIAGNOSIS — Z955 Presence of coronary angioplasty implant and graft: Secondary | ICD-10-CM | POA: Diagnosis not present

## 2017-03-16 DIAGNOSIS — Z7902 Long term (current) use of antithrombotics/antiplatelets: Secondary | ICD-10-CM | POA: Insufficient documentation

## 2017-03-16 DIAGNOSIS — R531 Weakness: Secondary | ICD-10-CM

## 2017-03-16 DIAGNOSIS — Z951 Presence of aortocoronary bypass graft: Secondary | ICD-10-CM | POA: Diagnosis not present

## 2017-03-16 DIAGNOSIS — I2510 Atherosclerotic heart disease of native coronary artery without angina pectoris: Secondary | ICD-10-CM | POA: Insufficient documentation

## 2017-03-16 DIAGNOSIS — N390 Urinary tract infection, site not specified: Secondary | ICD-10-CM | POA: Diagnosis not present

## 2017-03-16 DIAGNOSIS — Z96642 Presence of left artificial hip joint: Secondary | ICD-10-CM | POA: Diagnosis not present

## 2017-03-16 DIAGNOSIS — H538 Other visual disturbances: Secondary | ICD-10-CM

## 2017-03-16 DIAGNOSIS — Z8673 Personal history of transient ischemic attack (TIA), and cerebral infarction without residual deficits: Secondary | ICD-10-CM | POA: Diagnosis not present

## 2017-03-16 DIAGNOSIS — I110 Hypertensive heart disease with heart failure: Secondary | ICD-10-CM | POA: Insufficient documentation

## 2017-03-16 DIAGNOSIS — R404 Transient alteration of awareness: Secondary | ICD-10-CM | POA: Diagnosis not present

## 2017-03-16 DIAGNOSIS — R51 Headache: Secondary | ICD-10-CM | POA: Diagnosis not present

## 2017-03-16 DIAGNOSIS — R9431 Abnormal electrocardiogram [ECG] [EKG]: Secondary | ICD-10-CM | POA: Diagnosis not present

## 2017-03-16 LAB — I-STAT CHEM 8, ED
BUN: 11 mg/dL (ref 6–20)
CALCIUM ION: 1.08 mmol/L — AB (ref 1.15–1.40)
CHLORIDE: 95 mmol/L — AB (ref 101–111)
Creatinine, Ser: 0.5 mg/dL (ref 0.44–1.00)
GLUCOSE: 93 mg/dL (ref 65–99)
HCT: 34 % — ABNORMAL LOW (ref 36.0–46.0)
Hemoglobin: 11.6 g/dL — ABNORMAL LOW (ref 12.0–15.0)
Potassium: 4.1 mmol/L (ref 3.5–5.1)
Sodium: 133 mmol/L — ABNORMAL LOW (ref 135–145)
TCO2: 28 mmol/L (ref 22–32)

## 2017-03-16 LAB — URINALYSIS, ROUTINE W REFLEX MICROSCOPIC
Bacteria, UA: NONE SEEN
Bilirubin Urine: NEGATIVE
Glucose, UA: NEGATIVE mg/dL
Hgb urine dipstick: NEGATIVE
Ketones, ur: NEGATIVE mg/dL
Nitrite: NEGATIVE
PH: 7 (ref 5.0–8.0)
Protein, ur: NEGATIVE mg/dL
SPECIFIC GRAVITY, URINE: 1.01 (ref 1.005–1.030)

## 2017-03-16 LAB — COMPREHENSIVE METABOLIC PANEL
ALBUMIN: 3.3 g/dL — AB (ref 3.5–5.0)
ALK PHOS: 61 U/L (ref 38–126)
ALT: 18 U/L (ref 14–54)
AST: 18 U/L (ref 15–41)
Anion gap: 9 (ref 5–15)
BILIRUBIN TOTAL: 0.7 mg/dL (ref 0.3–1.2)
BUN: 10 mg/dL (ref 6–20)
CO2: 25 mmol/L (ref 22–32)
Calcium: 9 mg/dL (ref 8.9–10.3)
Chloride: 98 mmol/L — ABNORMAL LOW (ref 101–111)
Creatinine, Ser: 0.51 mg/dL (ref 0.44–1.00)
GFR calc Af Amer: >60 mL/min (ref >60)
GFR calc non Af Amer: >60 mL/min (ref >60)
Glucose, Bld: 94 mg/dL (ref 65–99)
Potassium: 4.1 mmol/L (ref 3.5–5.1)
Sodium: 132 mmol/L — ABNORMAL LOW (ref 135–145)
Total Protein: 6.1 g/dL — ABNORMAL LOW (ref 6.5–8.1)

## 2017-03-16 LAB — RAPID URINE DRUG SCREEN, HOSP PERFORMED
AMPHETAMINES: NOT DETECTED
BENZODIAZEPINES: NOT DETECTED
Barbiturates: NOT DETECTED
COCAINE: NOT DETECTED
OPIATES: NOT DETECTED
Tetrahydrocannabinol: NOT DETECTED

## 2017-03-16 LAB — I-STAT TROPONIN, ED: TROPONIN I, POC: 0 ng/mL (ref 0.00–0.08)

## 2017-03-16 LAB — PROTIME-INR
INR: 0.96
Prothrombin Time: 12.7 seconds (ref 11.4–15.2)

## 2017-03-16 LAB — CBC
HCT: 34.5 % — ABNORMAL LOW (ref 36.0–46.0)
Hemoglobin: 11.8 g/dL — ABNORMAL LOW (ref 12.0–15.0)
MCH: 31.7 pg (ref 26.0–34.0)
MCHC: 34.2 g/dL (ref 30.0–36.0)
MCV: 92.7 fL (ref 78.0–100.0)
PLATELETS: 221 10*3/uL (ref 150–400)
RBC: 3.72 MIL/uL — ABNORMAL LOW (ref 3.87–5.11)
RDW: 12.9 % (ref 11.5–15.5)
WBC: 8.5 10*3/uL (ref 4.0–10.5)

## 2017-03-16 LAB — DIFFERENTIAL
BASOS ABS: 0 10*3/uL (ref 0.0–0.1)
Basophils Relative: 1 %
EOS PCT: 5 %
Eosinophils Absolute: 0.5 10*3/uL (ref 0.0–0.7)
LYMPHS ABS: 2.3 10*3/uL (ref 0.7–4.0)
LYMPHS PCT: 27 %
Monocytes Absolute: 0.7 10*3/uL (ref 0.1–1.0)
Monocytes Relative: 8 %
NEUTROS PCT: 59 %
Neutro Abs: 5.3 10*3/uL (ref 1.7–7.7)

## 2017-03-16 LAB — APTT: aPTT: 29 seconds (ref 24–36)

## 2017-03-16 LAB — ETHANOL

## 2017-03-16 MED ORDER — TETRACAINE HCL 0.5 % OP SOLN
2.0000 [drp] | Freq: Once | OPHTHALMIC | Status: AC
  Administered 2017-03-16: 2 [drp] via OPHTHALMIC
  Filled 2017-03-16: qty 4

## 2017-03-16 MED ORDER — FLUORESCEIN SODIUM 1 MG OP STRP
1.0000 | ORAL_STRIP | Freq: Once | OPHTHALMIC | Status: AC
  Administered 2017-03-16: 1 via OPHTHALMIC
  Filled 2017-03-16: qty 1

## 2017-03-16 MED ORDER — CEPHALEXIN 500 MG PO CAPS: 500.0000 mg | ORAL_CAPSULE | Freq: Two times a day (BID) | ORAL | 0 refills | Status: AC

## 2017-03-16 NOTE — ED Notes (Signed)
This RN called by lab regarding add on urine culture. This RN instructed to send urine culture order requisition with empty grey urine tube to main lab. Main lab confirmed grey urine tube did not have to be filled, this RN instructed to send empty due to urine specimen being send at approx 1:30 PM. Urine req and empty grey tube sent to main lab.

## 2017-03-16 NOTE — ED Provider Notes (Signed)
[facility] EMERGENCY DEPARTMENT Provider Note   CSN: [id number] Arrival date & time: 03/16/17  1325     History   Chief Complaint Chief Complaint  Patient presents with  . Weakness    HPI Gina Bowers is a 83 y.o. female.  HPI  83 year old female with a history of diastolic CHF, CAD, prior stroke with mild left-sided deficits presents with acute left eye blurry vision and trouble opening her left eye.  History is taken from daughter and patient.  The patient went to bed late last night, later than typical at 11 PM.  She was woken up by the caregiver at 10:30 AM, typically does not sleep this long.  When she first awoke she had trouble opening her left eye and states she could not open it.  By the time the daughter saw her she was still complaining of not being able to open her eye but her eye was already open.  She also complained of blurry vision.  No double vision.  The blurry vision is only in the left eye.  Daughter noticed a slightly worse facial droop than her typical from her residual stroke.  The patient complains of no headache but states her head felt funny, which she thinks is from the blurry vision.  No dizziness, ocular pain, slurred speech, focal weakness/numbness, chest pain or shortness of breath.  Caregiver noted that the patient's urine was dark this morning and she has a history of prior UTIs.  The patient also seemed generally weak today and had a hard time walking.  She was slower than normal but was able to ambulate and felt tired.  Past Medical History:  Diagnosis Date  . Arthritis   . Blood transfusion 2007   AFTER HIP REPLACEMENT  . Carotid bruit    PT'S DAUGHTER STATES PT HAD RECENT CAROTID STUDY AND WAS TOLD NO SIGNIFICANT BLOCKAGES  . Chronic diastolic CHF (congestive heart failure) ([place])    a. 07/2015 Ech: Ef 60-65%, Gr1 DD.  Marland Kitchen Coronary artery disease    a. 2009 CABG 4, Dr. Servando Snare (LIMA to LAD , SVG to diagonal, SVG to circumflex,  SVG to PDA); b. Cath 2012 in  Recent ill,[place] patent LIMA , Patent SVG to diagonal, occluded SVG to RCA. 5x66mm BMS->native RCA; c. 09/2015 Cath: LM nl, LAD 188m, D1 100, LCX small, OM1 90/small, OM2 70, RCA 45p, patent stent, VG->dRCA 100, VG->OM1 100, VG->Diag nl, LIMA->LAD nl->Med Rx.  . Depression   . Fracture FEB 2013   FRACTURED LEFT ANKLE--NO SURGERY--WAS IN BOOT UNTIL COUPLE WEEKS AGO  . GERD (gastroesophageal reflux disease)   . High cholesterol   . Hypothyroidism   . Ischemic colitis ([place]) 2010  . Kidney stones    IN THE PAST  . 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 [place] virus    COUPLE OF WEEKS AGO--ALL SYMTOMS RESOLVED PER PT  . Obstructive sleep apnea 2010  . Shortness of breath    AND WHEEZING AT TIMES--NO INHALERS  . Stroke ([place])    SEVERAL STROKES -TOLD SHE HAS CEREBELLUM BLOCKAGE AS RESULT OF STROKE--BUT NEUROLOGIST SAID HE DID NOT WANT TO DO ANY PROCEDURE BECAUSE OF HER AGE .  PT HAS SLIGHT LEFT FOOT DROP-DRAGS FOOT WHEN WALKING - USES WALKER  . Urinary incontinence   . UTI (lower urinary tract infection)    HX OF UTI'S-- LAST TIME COUPLE OF MONTHS AGO  . Vertigo     Patient  Active Problem List   Diagnosis Date Noted  . UTI (urinary tract infection) 06/10/2016  . Acute pyelonephritis   . SIRS (systemic inflammatory response syndrome) (HCC)   . Epistaxis 03/09/2016  . Ocular migraine 11/04/2015  . Essential hypertension 11/04/2015  . HLD (hyperlipidemia) 11/04/2015  . Coronary artery disease involving coronary bypass graft of native heart 11/04/2015  . Fatigue 10/08/2015  . Urinary tract infection 10/05/2015  . Chest pain 10/04/2015  . Hypertension 10/04/2015  . Unstable angina ([place])   . Hypertensive heart disease without heart failure   . S/P CABG 2009-cath 10/07/15-medical Rx   . S/P coronary artery stent placement-2012   . Acute CVA (cerebrovascular accident) ([place]) 08/17/2015  . Possible Renovascular hypertension 07/25/2015  .  Hyperlipidemia 07/25/2015  . History of stroke-March 2017 (rt brain) 07/25/2015  . Vertebrobasilar artery stenosis 07/25/2015  . CAD (coronary artery disease) of artery bypass graft 06/06/2014  . Diastolic dysfunction-grade 1 with EF 60% 06/06/2014  . CVA (cerebral vascular accident) ([place]) 11/17/2011  . TIA post cath 10/07/15 11/13/2011  . Weakness 11/13/2011  . Obstructive sleep apnea 11/23/2008  . PULMONARY NODULE 11/23/2008  . Hypoxemia 11/23/2008  . Hypothyroidism 11/22/2008  . ANXIETY 11/22/2008  . Cerebral artery occlusion with cerebral infarction ([place]) 11/22/2008    Past Surgical History:  Procedure Laterality Date  . ABDOMINAL HYSTERECTOMY    . APPENDECTOMY    . [place] AND 1990  . CARDIAC CATHETERIZATION N/A 10/07/2015   Procedure: Left Heart Cath and Cors/Grafts Angiography;  Surgeon: Peter M Martinique, MD;  Location: [facility];  Service: Cardiovascular;  Laterality: N/A;  . CATARACT EXTRACTION    . CORONARY ANGIOPLASTY  10/2010   WITH STENT PLACEMENT  . CORONARY ARTERY BYPASS GRAFT  2009  . CYSTOSCOPY WITH INJECTION  09/21/2011   Procedure: CYSTOSCOPY WITH INJECTION;  Surgeon: Ailene Rud, MD;  Location: [facility];  Service: Urology;  Laterality: N/A;  Peri Urethral Macroplastique Injection and Estring Placement  . HERNIA REPAIR    . JOINT REPLACEMENT  2007   HIP REPLACEMENT -LEFT  . TONSILLECTOMY      OB History    No data available       Home Medications    Prior to Admission medications   Medication Sig Start Date End Date Taking? Authorizing Provider  acetaminophen (TYLENOL) 500 MG tablet Take 1,000 mg by mouth every 6 (six) hours as needed for mild pain or fever.     [provider]  Albuterol Sulfate (PROAIR RESPICLICK) 742 (90 Base) MCG/ACT AEPB Inhale 2 puffs into the lungs daily as needed (shortness of breath).    [provider]  alendronate (FOSAMAX) 70 MG tablet Take 1 tablet (70 mg total) by mouth once a  week. Take with a full glass of water on an empty stomach.mon Patient taking differently: Take 70 mg by mouth every Monday. Take with a full glass of water on an empty stomach 10/08/15   Kerin Ransom K, PA-C  amoxicillin (AMOXIL) 500 MG tablet Take 1 tablet (500 mg total) by mouth 3 (three) times daily. 06/19/16   Verlee Monte, MD  aspirin EC 81 MG tablet Take 81 mg by mouth every evening.     [provider]  Calcium Carb-Cholecalciferol (CALCIUM 1000 + D PO) Take 1 tablet by mouth every evening.     [provider]  cephALEXin (KEFLEX) 500 MG capsule Take 1 capsule (500 mg total) by mouth 2 (two) times  daily. 03/16/17 03/19/17  Sherwood Gambler, MD  clopidogrel (PLAVIX) 75 MG tablet Take 1 tablet (75 mg total) by mouth daily. 06/06/14   Croitoru, Mihai, MD  CRANBERRY EXTRACT PO Take 500 mg by mouth 2 (two) times daily.     [provider]  fish oil-omega-3 fatty acids 1000 MG capsule Take 1 g by mouth every evening.     [provider]  ipratropium-albuterol (DUONEB) 0.5-2.5 (3) MG/3ML SOLN Take 3 mLs by nebulization every 4 (four) hours as needed. 06/16/16   Theodis Blaze, MD  isosorbide mononitrate (IMDUR) 30 MG 24 hr tablet TAKE 1/2 TABLET (15MG  TOTAL) BY MOUTH DAILY 12/11/16   Croitoru, Dani Gobble, MD  levothyroxine (SYNTHROID, LEVOTHROID) 75 MCG tablet Take 1 tablet (75 mcg total) by mouth daily before breakfast. 06/06/14   Croitoru, Mihai, MD  losartan (COZAAR) 25 MG tablet TAKE ONE TABLET BY MOUTH TWICE A DAY 01/06/17   Croitoru, Mihai, MD  meclizine (ANTIVERT) 25 MG tablet Take 25 mg by mouth 3 (three) times daily as needed for dizziness.    [provider]  metoprolol succinate (TOPROL-XL) 25 MG 24 hr tablet TAKE ONE TABLET BY MOUTH DAILY 11/16/16   Lelon Perla, MD  Multiple Vitamin (MULITIVITAMIN WITH MINERALS) TABS Take 1 tablet by mouth daily with breakfast.    [provider]  nitroGLYCERIN (NITROSTAT) 0.4 MG SL tablet Place 1 tablet (0.4  mg total) under the tongue every 5 (five) minutes as needed for chest pain. 10/08/15   Erlene Quan, PA-C  ondansetron (ZOFRAN ODT) 4 MG disintegrating tablet Take one every 6 hours for nausea Patient taking differently: Take 4 mg by mouth every 6 (six) hours as needed for nausea or vomiting.  06/06/14   Croitoru, Dani Gobble, MD  OVER THE COUNTER MEDICATION See admin instructions. Immuplex Capsule: Take 1 capsule by mouth 2 times a day    [provider]  pantoprazole (PROTONIX) 40 MG tablet Take 1 tablet (40 mg total) by mouth daily. 06/08/16   Croitoru, Mihai, MD  Polyethyl Glycol-Propyl Glycol (SYSTANE OP) Place 1 drop into both eyes 3 (three) times daily. Patient states its the Gel form (soothes)    [provider]  predniSONE (DELTASONE) 10 MG tablet Take 50 mg tablet on 1/24 and taper down by 10 mg daily until completed 06/16/16   Theodis Blaze, MD  Probiotic CAPS Take 1 capsule by mouth daily with breakfast.    [provider]  rosuvastatin (CRESTOR) 20 MG tablet TAKE ONE TABLET BY MOUTH DAILY 12/22/16   Croitoru, Dani Gobble, MD  sertraline (ZOLOFT) 50 MG tablet Take 1 tablet (50 mg total) by mouth daily. 04/09/16   Croitoru, Mihai, MD  solifenacin (VESICARE) 5 MG tablet Take 1 tablet (5 mg total) by mouth daily. Patient taking differently: Take 5 mg by mouth every evening.  10/09/15   Rogelia Mire, NP    Family History Family History  Problem Relation Age of Onset  . Heart disease Mother     Social History Social History  Substance Use Topics  . Smoking status: Never Smoker  . Smokeless tobacco: Never Used  . Alcohol use No     Allergies   Sulfa antibiotics and Sulfonamide derivatives   Review of Systems Review of Systems  Constitutional: Positive for fatigue. Negative for fever.  Eyes: Positive for visual disturbance. Negative for pain and redness.  Respiratory: Negative for shortness of breath.   Cardiovascular: Negative for chest pain.    Gastrointestinal: Negative  for vomiting.  Genitourinary: Negative for dysuria.  Neurological: Negative for dizziness, speech difficulty, weakness, numbness and headaches.  All other systems reviewed and are negative.    Physical Exam Updated Vital Signs BP (!) 170/65 (BP Location: Left Arm)   Pulse 65   Temp 98 F (36.7 C)   Resp 14   SpO2 95%   Physical Exam  Constitutional: She is oriented to person, place, and time. She appears well-developed and well-nourished. No distress.  HENT:  Head: Normocephalic and atraumatic.  Right Ear: External ear normal.  Left Ear: External ear normal.  Nose: Nose normal.  Eyes: Pupils are equal, round, and reactive to light. EOM are normal. Right eye exhibits no discharge. Left eye exhibits no discharge.  Reports diffuse blurry vision but can read my name tag with each eye. No ptosis   Neck: Neck supple.  Cardiovascular: Normal rate, regular rhythm and normal heart sounds.   Pulmonary/Chest: Effort normal and breath sounds normal.  Abdominal: Soft. There is no tenderness.  Neurological: She is alert and oriented to person, place, and time.  CN 3-12 grossly intact. 5/5 strength in all 4 extremities. Grossly normal sensation. Normal finger to nose.   Skin: Skin is warm and dry. She is not diaphoretic.  Nursing note and vitals reviewed.    ED Treatments / Results  Labs (all labs ordered are listed, but only abnormal results are displayed) Labs Reviewed  CBC - Abnormal; Notable for the following:       Result Value   RBC 3.72 (*)    Hemoglobin 11.8 (*)    HCT 34.5 (*)    All other components within normal limits  URINALYSIS, ROUTINE W REFLEX MICROSCOPIC - Abnormal; Notable for the following:    Leukocytes, UA SMALL (*)    Squamous Epithelial / LPF 0-5 (*)    All other components within normal limits  COMPREHENSIVE METABOLIC PANEL - Abnormal; Notable for the following:    Sodium 132 (*)    Chloride 98 (*)    Total Protein 6.1 (*)     Albumin 3.3 (*)    All other components within normal limits  I-STAT CHEM 8, ED - Abnormal; Notable for the following:    Sodium 133 (*)    Chloride 95 (*)    Calcium, Ion 1.08 (*)    Hemoglobin 11.6 (*)    HCT 34.0 (*)    All other components within normal limits  URINE CULTURE  ETHANOL  PROTIME-INR  APTT  DIFFERENTIAL  RAPID URINE DRUG SCREEN, HOSP PERFORMED  CBG MONITORING, ED  I-STAT TROPONIN, ED    EKG  EKG Interpretation  Date/Time:  Tuesday March 16 2017 13:38:44 EDT Ventricular Rate:  59 PR Interval:    QRS Duration: 88 QT Interval:  452 QTC Calculation: 448 R Axis:   11 Text Interpretation:  Sinus rhythm Abnormal R-wave progression, early transition Left ventricular hypertrophy no significant change since 2017 Confirmed by Sherwood Gambler [id number]) on 03/16/2017 2:22:23 PM       Radiology Ct Head Wo Contrast  Result Date: 03/16/2017 CLINICAL DATA:  Ophthalmoplegia which has resolved EXAM: CT HEAD WITHOUT CONTRAST TECHNIQUE: Contiguous axial images were obtained from the base of the skull through the vertex without intravenous contrast. COMPARISON:  08/16/2015 FINDINGS: Brain: Atrophic changes are noted similar to that noted on the prior exam. Chronic white matter ischemic change is seen as well. No findings to suggest acute hemorrhage, acute infarction or space-occupying mass lesion are noted. Vascular: No hyperdense  vessel or unexpected calcification. Skull: Normal. Negative for fracture or focal lesion. Sinuses/Orbits: No acute finding. Other: None IMPRESSION: Chronic atrophic and ischemic changes without acute abnormality. Electronically Signed   By: Inez Catalina M.D.   On: 03/16/2017 14:33    Procedures Procedures (including critical care time)  EMERGENCY DEPARTMENT [place] OCULAR EXAM "Study: Limited Ultrasound of Orbit "  INDICATIONS: Vision loss Linear probe utilized to obtain images in both long and short axis of the orbit having the patient look left  and right if possible.  PERFORMED BY: Myself IMAGES ARCHIVED?: Yes LIMITATIONS: none VIEWS USED: Left orbit INTERPRETATION: No retinal detachment, Lens in proper position  Medications Ordered in ED Medications  tetracaine (PONTOCAINE) 0.5 % ophthalmic solution 2 drop (2 drops Left Eye Given by Other 03/16/17 1614)  fluorescein ophthalmic strip 1 strip (1 strip Left Eye Given by Other 03/16/17 1614)     Initial Impression / Assessment and Plan / ED Course  I have reviewed the triage vital signs and the nursing notes.  Pertinent labs & imaging results that were available during my care of the patient were reviewed by me and considered in my medical decision making (see chart for details).     Unclear cause of the patient's eye symptoms.  Patient and family are most concerned about possible recurrent stroke.  While they reports she was unable to open her eye earlier, she has no difficulty with that right now.  No facial droop.  Neuro exam unremarkable.  She does have some blurry vision in that eye.  She states this is new but family also states she has macular problems that she is following with ophthalmology for.  My suspicion is this might be acute on chronic.  Given no ptosis or other acute findings I think stroke is less likely.  I did discuss that she could have had a small stroke just in her retinal artery.  Offered MRI but family agrees they would not do much different since she is Artie on a blood thinner and would not want to be admitted as this does not seem to have a very large effect on her life right now.  Thus MRI deferred.  No clear ocular cause as her intraocular pressure on my exam is 13, her bedside ultrasound shows no obvious retinal detachment, and there is no sign of corneal abrasion on fluorescein staining.  Thus, follow-up closely with her ophthalmologist.  She also has dark urine and frequently gets UTIs.  Urine is borderline for UTI with small leukocytes and white blood  cells.  Treat with Keflex.  Return precautions.  Final Clinical Impressions(s) / ED Diagnoses   Final diagnoses:  Generalized weakness  Acute urinary tract infection  Blurry vision, left eye    New Prescriptions New Prescriptions   CEPHALEXIN (KEFLEX) 500 MG CAPSULE    Take 1 capsule (500 mg total) by mouth 2 (two) times daily.     Sherwood Gambler, MD 03/16/17 [phone]

## 2017-03-16 NOTE — ED Triage Notes (Signed)
Patient from home with generalized weakness and blurred vision in the left eye since she woke up at 1030 this morning.  Patient states she went to sleep at 2300 last night and was asymptomatic.  History of strokes and MI, takes Plavix and 81mg  aspirin everyday. Denies any other complaints at this time.  Alert and oriented and in no apparent distress at this time. 18g saline lock in right forearm.

## 2017-03-18 ENCOUNTER — Other Ambulatory Visit: Payer: Self-pay | Admitting: Cardiovascular Disease

## 2017-03-18 NOTE — Telephone Encounter (Signed)
Rx(s) sent to pharmacy electronically.  

## 2017-03-19 LAB — URINE CULTURE: Culture: >=100000 — AB

## 2017-03-20 ENCOUNTER — Telehealth: Payer: Self-pay

## 2017-03-20 NOTE — Progress Notes (Signed)
ED Antimicrobial Stewardship Positive Culture Follow Up   Gina Bowers is an 83 y.o. female who presented to [facility] on 03/16/2017 with a chief complaint of blurry vision and general weakness.  Chief Complaint  Patient presents with  . Weakness    Recent Results (from the past 720 hour(s))  Urine culture     Status: Abnormal   Collection Time: 03/16/17  1:45 PM  Result Value Ref Range Status   Specimen Description URINE, CLEAN CATCH  Final   Special Requests NONE  Final   Culture >=100,000 COLONIES/mL ENTEROCOCCUS FAECALIS (A)  Final   Report Status 03/19/2017 FINAL  Final   Organism ID, Bacteria ENTEROCOCCUS FAECALIS (A)  Final      Susceptibility   Enterococcus faecalis - MIC*    AMPICILLIN <=2 SENSITIVE Sensitive     LEVOFLOXACIN >=8 RESISTANT Resistant     NITROFURANTOIN <=16 SENSITIVE Sensitive     VANCOMYCIN 1 SENSITIVE Sensitive     * >=100,000 COLONIES/mL ENTEROCOCCUS FAECALIS    [x]  Treated with cephalexin 500 mg twice daily, organism resistant to prescribed antimicrobial  New antibiotic prescription: Nitrofurantoin 100 mg twice daily x 5 days only if patient continues to show symptoms of UTI   ED Provider: Delia Heady, PA-C  Albertina Parr, PharmD., [place] Pharmacist Pager [phone]

## 2017-03-20 NOTE — Telephone Encounter (Signed)
Post ED Visit - Positive Culture Follow-up: Successful Patient Follow-Up  Culture assessed and recommendations reviewed by: []  Elenor Quinones, Pharm.D. []  Heide Guile, Pharm.D., BCPS AQ-ID []  Parks Neptune, Pharm.D., BCPS []  Alycia Rossetti, Pharm.D., BCPS []  [place], Pharm.D., BCPS, AAHIVP []  Legrand Como, Pharm.D., BCPS, AAHIVP []  Salome Arnt, PharmD, BCPS []  Dimitri Ped, PharmD, BCPS []  Vincenza Hews, PharmD, BCPS [facility] Pharm D Positive urine culture  []  Patient discharged without antimicrobial prescription and treatment is now indicated [x]  Organism is resistant to prescribed ED discharge antimicrobial []  Patient with positive blood cultures  Changes discussed with ED provider: Delia Heady PA New antibiotic prescription Nitrofurantoin 100 mg BID x 5 days Called to [facility] [phone]  Contacted patient, date 03/20/17, time 1141   Cristel Rail, [place] Comment 03/20/2017, 11:39 AM

## 2017-04-23 DIAGNOSIS — N390 Urinary tract infection, site not specified: Secondary | ICD-10-CM | POA: Diagnosis not present

## 2017-05-12 ENCOUNTER — Other Ambulatory Visit: Payer: Self-pay | Admitting: Cardiology

## 2017-05-14 ENCOUNTER — Other Ambulatory Visit: Payer: Self-pay | Admitting: Cardiology

## 2017-06-07 ENCOUNTER — Other Ambulatory Visit: Payer: Self-pay | Admitting: Cardiovascular Disease

## 2017-06-07 NOTE — Telephone Encounter (Signed)
Rx(s) sent to pharmacy electronically.  

## 2017-06-08 ENCOUNTER — Emergency Department (HOSPITAL_COMMUNITY): Payer: Medicare Other

## 2017-06-08 ENCOUNTER — Encounter (HOSPITAL_COMMUNITY): Payer: Self-pay | Admitting: Emergency Medicine

## 2017-06-08 ENCOUNTER — Inpatient Hospital Stay (HOSPITAL_COMMUNITY)
Admission: EM | Admit: 2017-06-08 | Discharge: 2017-06-12 | DRG: 535 | Disposition: A | Discharge status: Skilled Nursing Facility | Payer: Medicare Other | Attending: Internal Medicine | Admitting: Internal Medicine

## 2017-06-08 ENCOUNTER — Observation Stay (HOSPITAL_COMMUNITY): Payer: Medicare Other

## 2017-06-08 ENCOUNTER — Other Ambulatory Visit: Payer: Self-pay

## 2017-06-08 DIAGNOSIS — S4992XA Unspecified injury of left shoulder and upper arm, initial encounter: Secondary | ICD-10-CM | POA: Diagnosis not present

## 2017-06-08 DIAGNOSIS — F329 Major depressive disorder, single episode, unspecified: Secondary | ICD-10-CM | POA: Diagnosis present

## 2017-06-08 DIAGNOSIS — N390 Urinary tract infection, site not specified: Secondary | ICD-10-CM | POA: Diagnosis present

## 2017-06-08 DIAGNOSIS — Z7982 Long term (current) use of aspirin: Secondary | ICD-10-CM

## 2017-06-08 DIAGNOSIS — S8992XA Unspecified injury of left lower leg, initial encounter: Secondary | ICD-10-CM | POA: Diagnosis not present

## 2017-06-08 DIAGNOSIS — S199XXA Unspecified injury of neck, initial encounter: Secondary | ICD-10-CM | POA: Diagnosis not present

## 2017-06-08 DIAGNOSIS — Z951 Presence of aortocoronary bypass graft: Secondary | ICD-10-CM

## 2017-06-08 DIAGNOSIS — S32592A Other specified fracture of left pubis, initial encounter for closed fracture: Secondary | ICD-10-CM

## 2017-06-08 DIAGNOSIS — E871 Hypo-osmolality and hyponatremia: Secondary | ICD-10-CM | POA: Diagnosis not present

## 2017-06-08 DIAGNOSIS — F411 Generalized anxiety disorder: Secondary | ICD-10-CM | POA: Diagnosis present

## 2017-06-08 DIAGNOSIS — S32402A Unspecified fracture of left acetabulum, initial encounter for closed fracture: Secondary | ICD-10-CM | POA: Diagnosis not present

## 2017-06-08 DIAGNOSIS — W19XXXA Unspecified fall, initial encounter: Secondary | ICD-10-CM

## 2017-06-08 DIAGNOSIS — I5032 Chronic diastolic (congestive) heart failure: Secondary | ICD-10-CM | POA: Diagnosis not present

## 2017-06-08 DIAGNOSIS — S0990XA Unspecified injury of head, initial encounter: Secondary | ICD-10-CM | POA: Diagnosis not present

## 2017-06-08 DIAGNOSIS — T148XXA Other injury of unspecified body region, initial encounter: Secondary | ICD-10-CM | POA: Diagnosis not present

## 2017-06-08 DIAGNOSIS — Z79899 Other long term (current) drug therapy: Secondary | ICD-10-CM

## 2017-06-08 DIAGNOSIS — Z955 Presence of coronary angioplasty implant and graft: Secondary | ICD-10-CM

## 2017-06-08 DIAGNOSIS — E039 Hypothyroidism, unspecified: Secondary | ICD-10-CM | POA: Diagnosis present

## 2017-06-08 DIAGNOSIS — S32599A Other specified fracture of unspecified pubis, initial encounter for closed fracture: Secondary | ICD-10-CM | POA: Diagnosis present

## 2017-06-08 DIAGNOSIS — I110 Hypertensive heart disease with heart failure: Secondary | ICD-10-CM | POA: Diagnosis present

## 2017-06-08 DIAGNOSIS — M79662 Pain in left lower leg: Secondary | ICD-10-CM

## 2017-06-08 DIAGNOSIS — M25552 Pain in left hip: Secondary | ICD-10-CM | POA: Diagnosis not present

## 2017-06-08 DIAGNOSIS — M898X6 Other specified disorders of bone, lower leg: Secondary | ICD-10-CM

## 2017-06-08 DIAGNOSIS — Z66 Do not resuscitate: Secondary | ICD-10-CM | POA: Diagnosis present

## 2017-06-08 DIAGNOSIS — G4733 Obstructive sleep apnea (adult) (pediatric): Secondary | ICD-10-CM | POA: Diagnosis present

## 2017-06-08 DIAGNOSIS — S32409A Unspecified fracture of unspecified acetabulum, initial encounter for closed fracture: Secondary | ICD-10-CM | POA: Diagnosis present

## 2017-06-08 DIAGNOSIS — B965 Pseudomonas (aeruginosa) (mallei) (pseudomallei) as the cause of diseases classified elsewhere: Secondary | ICD-10-CM | POA: Diagnosis not present

## 2017-06-08 DIAGNOSIS — Y92002 Bathroom of unspecified non-institutional (private) residence single-family (private) house as the place of occurrence of the external cause: Secondary | ICD-10-CM

## 2017-06-08 DIAGNOSIS — E7800 Pure hypercholesterolemia, unspecified: Secondary | ICD-10-CM | POA: Diagnosis present

## 2017-06-08 DIAGNOSIS — Z7901 Long term (current) use of anticoagulants: Secondary | ICD-10-CM

## 2017-06-08 DIAGNOSIS — K219 Gastro-esophageal reflux disease without esophagitis: Secondary | ICD-10-CM | POA: Diagnosis present

## 2017-06-08 DIAGNOSIS — F419 Anxiety disorder, unspecified: Secondary | ICD-10-CM | POA: Diagnosis present

## 2017-06-08 DIAGNOSIS — Y92009 Unspecified place in unspecified non-institutional (private) residence as the place of occurrence of the external cause: Secondary | ICD-10-CM | POA: Diagnosis not present

## 2017-06-08 DIAGNOSIS — S32425A Nondisplaced fracture of posterior wall of left acetabulum, initial encounter for closed fracture: Secondary | ICD-10-CM

## 2017-06-08 DIAGNOSIS — S79912A Unspecified injury of left hip, initial encounter: Secondary | ICD-10-CM | POA: Diagnosis not present

## 2017-06-08 DIAGNOSIS — I2510 Atherosclerotic heart disease of native coronary artery without angina pectoris: Secondary | ICD-10-CM | POA: Diagnosis present

## 2017-06-08 DIAGNOSIS — S32502A Unspecified fracture of left pubis, initial encounter for closed fracture: Secondary | ICD-10-CM | POA: Diagnosis not present

## 2017-06-08 DIAGNOSIS — S32424A Nondisplaced fracture of posterior wall of right acetabulum, initial encounter for closed fracture: Secondary | ICD-10-CM | POA: Diagnosis not present

## 2017-06-08 DIAGNOSIS — E785 Hyperlipidemia, unspecified: Secondary | ICD-10-CM | POA: Diagnosis present

## 2017-06-08 DIAGNOSIS — Z96642 Presence of left artificial hip joint: Secondary | ICD-10-CM | POA: Diagnosis present

## 2017-06-08 DIAGNOSIS — R52 Pain, unspecified: Secondary | ICD-10-CM

## 2017-06-08 DIAGNOSIS — Z882 Allergy status to sulfonamides status: Secondary | ICD-10-CM

## 2017-06-08 DIAGNOSIS — K5900 Constipation, unspecified: Secondary | ICD-10-CM | POA: Diagnosis not present

## 2017-06-08 DIAGNOSIS — R51 Headache: Secondary | ICD-10-CM | POA: Diagnosis not present

## 2017-06-08 DIAGNOSIS — M810 Age-related osteoporosis without current pathological fracture: Secondary | ICD-10-CM | POA: Diagnosis present

## 2017-06-08 DIAGNOSIS — M542 Cervicalgia: Secondary | ICD-10-CM | POA: Diagnosis not present

## 2017-06-08 LAB — CBC WITH DIFFERENTIAL/PLATELET
Basophils Absolute: 0 10*3/uL (ref 0.0–0.1)
Basophils Relative: 0 %
Eosinophils Absolute: 0.2 10*3/uL (ref 0.0–0.7)
Eosinophils Relative: 2 %
HCT: 36.8 % (ref 36.0–46.0)
HEMOGLOBIN: 12.3 g/dL (ref 12.0–15.0)
LYMPHS ABS: 2.4 10*3/uL (ref 0.7–4.0)
LYMPHS PCT: 27 %
MCH: 31.5 pg (ref 26.0–34.0)
MCHC: 33.4 g/dL (ref 30.0–36.0)
MCV: 94.1 fL (ref 78.0–100.0)
Monocytes Absolute: 0.4 10*3/uL (ref 0.1–1.0)
Monocytes Relative: 4 %
NEUTROS ABS: 5.9 10*3/uL (ref 1.7–7.7)
NEUTROS PCT: 67 %
Platelets: 201 10*3/uL (ref 150–400)
RBC: 3.91 MIL/uL (ref 3.87–5.11)
RDW: 13.1 % (ref 11.5–15.5)
WBC: 8.9 10*3/uL (ref 4.0–10.5)

## 2017-06-08 LAB — COMPREHENSIVE METABOLIC PANEL
ALT: 21 U/L (ref 14–54)
AST: 26 U/L (ref 15–41)
Albumin: 3.3 g/dL — ABNORMAL LOW (ref 3.5–5.0)
Alkaline Phosphatase: 57 U/L (ref 38–126)
Anion gap: 10 (ref 5–15)
BUN: 10 mg/dL (ref 6–20)
CHLORIDE: 97 mmol/L — AB (ref 101–111)
CO2: 26 mmol/L (ref 22–32)
Calcium: 9 mg/dL (ref 8.9–10.3)
Creatinine, Ser: 0.54 mg/dL (ref 0.44–1.00)
Glucose, Bld: 113 mg/dL — ABNORMAL HIGH (ref 65–99)
POTASSIUM: 3.6 mmol/L (ref 3.5–5.1)
Sodium: 133 mmol/L — ABNORMAL LOW (ref 135–145)
Total Bilirubin: 0.7 mg/dL (ref 0.3–1.2)
Total Protein: 5.9 g/dL — ABNORMAL LOW (ref 6.5–8.1)

## 2017-06-08 LAB — PHOSPHORUS: PHOSPHORUS: 3.4 mg/dL (ref 2.5–4.6)

## 2017-06-08 LAB — TROPONIN I: Troponin I: <0.03 ng/mL (ref <0.03)

## 2017-06-08 LAB — TSH: TSH: 6.545 u[IU]/mL — AB (ref 0.350–4.500)

## 2017-06-08 LAB — MAGNESIUM: MAGNESIUM: 1.9 mg/dL (ref 1.7–2.4)

## 2017-06-08 LAB — CK: Total CK: 87 U/L (ref 38–234)

## 2017-06-08 MED ORDER — SODIUM CHLORIDE 0.9 % IV SOLN: INTRAVENOUS | Status: DC

## 2017-06-08 MED ORDER — CEPHALEXIN 250 MG PO CAPS
250.0000 mg | ORAL_CAPSULE | Freq: Every evening | ORAL | Status: DC
  Administered 2017-06-08 – 2017-06-09 (×2): 250 mg via ORAL
  Filled 2017-06-08 (×2): qty 1

## 2017-06-08 MED ORDER — ROSUVASTATIN CALCIUM 10 MG PO TABS
20.0000 mg | ORAL_TABLET | Freq: Every day | ORAL | Status: DC
  Administered 2017-06-08 – 2017-06-12 (×5): 20 mg via ORAL
  Filled 2017-06-08: qty 2
  Filled 2017-06-08: qty 1
  Filled 2017-06-08 (×3): qty 2

## 2017-06-08 MED ORDER — HYDROCODONE-ACETAMINOPHEN 5-325 MG PO TABS
1.0000 | ORAL_TABLET | Freq: Once | ORAL | Status: AC
  Administered 2017-06-08: 1 via ORAL
  Filled 2017-06-08: qty 1

## 2017-06-08 MED ORDER — HYDRALAZINE HCL 20 MG/ML IJ SOLN: 10.0000 mg | Freq: Three times a day (TID) | INTRAMUSCULAR | Status: DC | PRN

## 2017-06-08 MED ORDER — SODIUM CHLORIDE 0.9% FLUSH
3.0000 mL | Freq: Two times a day (BID) | INTRAVENOUS | Status: DC
  Administered 2017-06-09 – 2017-06-11 (×2): 3 mL via INTRAVENOUS

## 2017-06-08 MED ORDER — HYDROCODONE-ACETAMINOPHEN 5-325 MG PO TABS
1.0000 | ORAL_TABLET | ORAL | Status: DC | PRN
  Administered 2017-06-09 – 2017-06-12 (×3): 1 via ORAL
  Filled 2017-06-08 (×3): qty 1

## 2017-06-08 MED ORDER — ONDANSETRON HCL 4 MG/2ML IJ SOLN: 4.0000 mg | Freq: Four times a day (QID) | INTRAMUSCULAR | Status: DC | PRN

## 2017-06-08 MED ORDER — ISOSORBIDE MONONITRATE ER 30 MG PO TB24
15.0000 mg | ORAL_TABLET | Freq: Every day | ORAL | Status: DC
  Administered 2017-06-09 – 2017-06-12 (×4): 15 mg via ORAL
  Filled 2017-06-08 (×4): qty 1

## 2017-06-08 MED ORDER — LOSARTAN POTASSIUM 25 MG PO TABS
25.0000 mg | ORAL_TABLET | Freq: Two times a day (BID) | ORAL | Status: DC
  Administered 2017-06-08 – 2017-06-12 (×7): 25 mg via ORAL
  Filled 2017-06-08 (×8): qty 1

## 2017-06-08 MED ORDER — CLOPIDOGREL BISULFATE 75 MG PO TABS
75.0000 mg | ORAL_TABLET | Freq: Every day | ORAL | Status: DC
  Administered 2017-06-08 – 2017-06-12 (×5): 75 mg via ORAL
  Filled 2017-06-08 (×5): qty 1

## 2017-06-08 MED ORDER — ACETAMINOPHEN 650 MG RE SUPP: 650.0000 mg | Freq: Four times a day (QID) | RECTAL | Status: DC | PRN

## 2017-06-08 MED ORDER — DARIFENACIN HYDROBROMIDE ER 7.5 MG PO TB24
7.5000 mg | ORAL_TABLET | Freq: Every day | ORAL | Status: DC
  Administered 2017-06-08 – 2017-06-12 (×5): 7.5 mg via ORAL
  Filled 2017-06-08 (×5): qty 1

## 2017-06-08 MED ORDER — ONDANSETRON HCL 4 MG PO TABS: 4.0000 mg | ORAL_TABLET | Freq: Four times a day (QID) | ORAL | Status: DC | PRN

## 2017-06-08 MED ORDER — METOPROLOL SUCCINATE ER 25 MG PO TB24
25.0000 mg | ORAL_TABLET | Freq: Every day | ORAL | Status: DC
  Administered 2017-06-08 – 2017-06-12 (×5): 25 mg via ORAL
  Filled 2017-06-08 (×5): qty 1

## 2017-06-08 MED ORDER — IPRATROPIUM-ALBUTEROL 0.5-2.5 (3) MG/3ML IN SOLN: 3.0000 mL | RESPIRATORY_TRACT | Status: DC | PRN

## 2017-06-08 MED ORDER — NITROGLYCERIN 0.4 MG SL SUBL: 0.4000 mg | SUBLINGUAL_TABLET | SUBLINGUAL | Status: DC | PRN

## 2017-06-08 MED ORDER — ENOXAPARIN SODIUM 40 MG/0.4ML ~~LOC~~ SOLN
40.0000 mg | SUBCUTANEOUS | Status: DC
  Filled 2017-06-08: qty 0.4

## 2017-06-08 MED ORDER — ASPIRIN EC 81 MG PO TBEC
81.0000 mg | DELAYED_RELEASE_TABLET | Freq: Every evening | ORAL | Status: DC
  Administered 2017-06-08 – 2017-06-11 (×4): 81 mg via ORAL
  Filled 2017-06-08 (×4): qty 1

## 2017-06-08 MED ORDER — LEVOTHYROXINE SODIUM 75 MCG PO TABS
75.0000 ug | ORAL_TABLET | Freq: Every day | ORAL | Status: DC
  Administered 2017-06-09 – 2017-06-10 (×2): 75 ug via ORAL
  Filled 2017-06-08 (×3): qty 1

## 2017-06-08 MED ORDER — PANTOPRAZOLE SODIUM 40 MG PO TBEC
40.0000 mg | DELAYED_RELEASE_TABLET | Freq: Every day | ORAL | Status: DC
  Administered 2017-06-09 – 2017-06-12 (×4): 40 mg via ORAL
  Filled 2017-06-08 (×4): qty 1

## 2017-06-08 MED ORDER — ACETAMINOPHEN 325 MG PO TABS
650.0000 mg | ORAL_TABLET | Freq: Four times a day (QID) | ORAL | Status: DC | PRN
  Administered 2017-06-10 – 2017-06-12 (×4): 650 mg via ORAL
  Filled 2017-06-08 (×4): qty 2

## 2017-06-08 MED ORDER — SERTRALINE HCL 50 MG PO TABS
50.0000 mg | ORAL_TABLET | Freq: Every day | ORAL | Status: DC
  Administered 2017-06-08 – 2017-06-12 (×5): 50 mg via ORAL
  Filled 2017-06-08 (×5): qty 1

## 2017-06-08 NOTE — Progress Notes (Signed)
Nondisplaced left acetabular fracture adjacent to well fixed acetabulum from total hip replacement This occurred after a fall Plan nonweightbearing for 6 weeks I will let Dr. Durward Fortes know that the patient is in the hospital

## 2017-06-08 NOTE — ED Provider Notes (Signed)
[facility] EMERGENCY DEPARTMENT Provider Note   CSN: [id number] Arrival date & time: 06/08/17  1202     History   Chief Complaint Chief Complaint  Patient presents with  . Fall  . Hip Pain    HPI Gina Bowers is a 84 y.o. female.  Patient is an 84 year old female with a history of CHF, CAD, ischemic colitis, prior hip replacement presenting today after a fall at home.  Patient states she got up and went to the bathroom and she had been sitting on the toilet about 10 minutes and her right leg fell asleep.  She went to stand up and her leg gave out causing her to fall onto her right hip.  She gently hit her head on the side of the shower but states she did not hit it hard and she did not lose consciousness.  However given the fall and her location and the pain in her right hip she was not able to get up and laid on the floor for about 3 hours until her sitter came.  Patient denies any pain anywhere else except for her right hip.  She was not able to stand or ambulate after this.  She states she has had a hip replacement in that hip.  She denies any numbness of the leg now.  Her leg was fine prior to going to the bathroom and there is no numbness now she figures it was related to sitting down for a prolonged period of time.  She denies any urinary symptoms, chest pain, abdominal pain, shortness of breath.  She is otherwise feeling her normal self.   The history is provided by the patient.  Fall  This is a new problem. The current episode started 3 to 5 hours ago. The problem occurs constantly. The problem has not changed since onset.Pertinent negatives include no chest pain, no abdominal pain, no headaches and no shortness of breath. Associated symptoms comments: Left hip pain. The symptoms are aggravated by bending, twisting and standing. The symptoms are relieved by rest. The treatment provided no relief.  Hip Pain  Pertinent negatives include no chest pain, no  abdominal pain, no headaches and no shortness of breath.    Past Medical History:  Diagnosis Date  . Arthritis   . Blood transfusion 2007   AFTER HIP REPLACEMENT  . Carotid bruit    PT'S DAUGHTER STATES PT HAD RECENT CAROTID STUDY AND WAS TOLD NO SIGNIFICANT BLOCKAGES  . Chronic diastolic CHF (congestive heart failure) ([place])    a. 07/2015 Ech: Ef 60-65%, Gr1 DD.  Marland Kitchen Coronary artery disease    a. 2009 CABG 4, Dr. Servando Snare (LIMA to LAD , SVG to diagonal, SVG to circumflex, SVG to PDA); b. Cath 2012 in  Recent ill,[place] patent LIMA , Patent SVG to diagonal, occluded SVG to RCA. 5x63mm BMS->native RCA; c. 09/2015 Cath: LM nl, LAD 14m, D1 100, LCX small, OM1 90/small, OM2 70, RCA 45p, patent stent, VG->dRCA 100, VG->OM1 100, VG->Diag nl, LIMA->LAD nl->Med Rx.  . Depression   . Fracture FEB 2013   FRACTURED LEFT ANKLE--NO SURGERY--WAS IN BOOT UNTIL COUPLE WEEKS AGO  . GERD (gastroesophageal reflux disease)   . High cholesterol   . Hypothyroidism   . Ischemic colitis ([place]) 2010  . Kidney stones    IN THE PAST  . 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 [place] virus    COUPLE OF WEEKS AGO--ALL SYMTOMS RESOLVED PER PT  .  Obstructive sleep apnea 2010  . Shortness of breath    AND WHEEZING AT TIMES--NO INHALERS  . Stroke ([place])    SEVERAL STROKES -TOLD SHE HAS CEREBELLUM BLOCKAGE AS RESULT OF STROKE--BUT NEUROLOGIST SAID HE DID NOT WANT TO DO ANY PROCEDURE BECAUSE OF HER AGE .  PT HAS SLIGHT LEFT FOOT DROP-DRAGS FOOT WHEN WALKING - USES WALKER  . Urinary incontinence   . UTI (lower urinary tract infection)    HX OF UTI'S-- LAST TIME COUPLE OF MONTHS AGO  . Vertigo     Patient Active Problem List   Diagnosis Date Noted  . UTI (urinary tract infection) 06/10/2016  . Acute pyelonephritis   . SIRS (systemic inflammatory response syndrome) (HCC)   . Epistaxis 03/09/2016  . Ocular migraine 11/04/2015  . Essential hypertension 11/04/2015  . HLD (hyperlipidemia)  11/04/2015  . Coronary artery disease involving coronary bypass graft of native heart 11/04/2015  . Fatigue 10/08/2015  . Urinary tract infection 10/05/2015  . Chest pain 10/04/2015  . Hypertension 10/04/2015  . Unstable angina ([place])   . Hypertensive heart disease without heart failure   . S/P CABG 2009-cath 10/07/15-medical Rx   . S/P coronary artery stent placement-2012   . Acute CVA (cerebrovascular accident) ([place]) 08/17/2015  . Possible Renovascular hypertension 07/25/2015  . Hyperlipidemia 07/25/2015  . History of stroke-March 2017 (rt brain) 07/25/2015  . Vertebrobasilar artery stenosis 07/25/2015  . CAD (coronary artery disease) of artery bypass graft 06/06/2014  . Diastolic dysfunction-grade 1 with EF 60% 06/06/2014  . CVA (cerebral vascular accident) ([place]) 11/17/2011  . TIA post cath 10/07/15 11/13/2011  . Weakness 11/13/2011  . Obstructive sleep apnea 11/23/2008  . PULMONARY NODULE 11/23/2008  . Hypoxemia 11/23/2008  . Hypothyroidism 11/22/2008  . ANXIETY 11/22/2008  . Cerebral artery occlusion with cerebral infarction ([place]) 11/22/2008    Past Surgical History:  Procedure Laterality Date  . ABDOMINAL HYSTERECTOMY    . APPENDECTOMY    . [place] AND 1990  . CARDIAC CATHETERIZATION N/A 10/07/2015   Procedure: Left Heart Cath and Cors/Grafts Angiography;  Surgeon: Peter M Martinique, MD;  Location: [facility];  Service: Cardiovascular;  Laterality: N/A;  . CATARACT EXTRACTION    . CORONARY ANGIOPLASTY  10/2010   WITH STENT PLACEMENT  . CORONARY ARTERY BYPASS GRAFT  2009  . CYSTOSCOPY WITH INJECTION  09/21/2011   Procedure: CYSTOSCOPY WITH INJECTION;  Surgeon: Ailene Rud, MD;  Location: [facility];  Service: Urology;  Laterality: N/A;  Peri Urethral Macroplastique Injection and Estring Placement  . HERNIA REPAIR    . JOINT REPLACEMENT  2007   HIP REPLACEMENT -LEFT  . TONSILLECTOMY      OB History    No data available       Home  Medications    Prior to Admission medications   Medication Sig Start Date End Date Taking? Authorizing Provider  acetaminophen (TYLENOL) 500 MG tablet Take 1,000 mg by mouth every 6 (six) hours as needed for mild pain or fever.     [provider]  Albuterol Sulfate (PROAIR RESPICLICK) 034 (90 Base) MCG/ACT AEPB Inhale 2 puffs into the lungs daily as needed (shortness of breath).    [provider]  alendronate (FOSAMAX) 70 MG tablet Take 1 tablet (70 mg total) by mouth once a week. Take with a full glass of water on an empty stomach.mon Patient taking differently: Take 70 mg by mouth every Monday. Take with a full glass of  water on an empty stomach 10/08/15   Kerin Ransom K, PA-C  amoxicillin (AMOXIL) 500 MG tablet Take 1 tablet (500 mg total) by mouth 3 (three) times daily. 06/19/16   Verlee Monte, MD  aspirin EC 81 MG tablet Take 81 mg by mouth every evening.     [provider]  Calcium Carb-Cholecalciferol (CALCIUM 1000 + D PO) Take 1 tablet by mouth every evening.     [provider]  clopidogrel (PLAVIX) 75 MG tablet Take 1 tablet (75 mg total) by mouth daily. 06/06/14   Croitoru, Mihai, MD  CRANBERRY EXTRACT PO Take 500 mg by mouth 2 (two) times daily.     [provider]  fish oil-omega-3 fatty acids 1000 MG capsule Take 1 g by mouth every evening.     [provider]  ipratropium-albuterol (DUONEB) 0.5-2.5 (3) MG/3ML SOLN Take 3 mLs by nebulization every 4 (four) hours as needed. 06/16/16   Theodis Blaze, MD  isosorbide mononitrate (IMDUR) 30 MG 24 hr tablet TAKE 1/2 TABLET (15MG  TOTAL) BY MOUTH DAILY 12/11/16   Croitoru, Dani Gobble, MD  levothyroxine (SYNTHROID, LEVOTHROID) 75 MCG tablet Take 1 tablet (75 mcg total) by mouth daily before breakfast. 06/06/14   Croitoru, Mihai, MD  losartan (COZAAR) 25 MG tablet TAKE ONE TABLET BY MOUTH TWICE A DAY 01/06/17   Croitoru, Mihai, MD  meclizine (ANTIVERT) 25 MG tablet Take 25 mg by mouth 3 (three)  times daily as needed for dizziness.    [provider]  metoprolol succinate (TOPROL-XL) 25 MG 24 hr tablet TAKE ONE TABLET BY MOUTH DAILY 05/14/17   Lelon Perla, MD  Multiple Vitamin (MULITIVITAMIN WITH MINERALS) TABS Take 1 tablet by mouth daily with breakfast.    [provider]  nitroGLYCERIN (NITROSTAT) 0.4 MG SL tablet Place 1 tablet (0.4 mg total) under the tongue every 5 (five) minutes as needed for chest pain. 10/08/15   Erlene Quan, PA-C  ondansetron (ZOFRAN ODT) 4 MG disintegrating tablet Take one every 6 hours for nausea Patient taking differently: Take 4 mg by mouth every 6 (six) hours as needed for nausea or vomiting.  06/06/14   Croitoru, Dani Gobble, MD  OVER THE COUNTER MEDICATION See admin instructions. Immuplex Capsule: Take 1 capsule by mouth 2 times a day    [provider]  pantoprazole (PROTONIX) 40 MG tablet Take 1 tablet (40 mg total) by mouth daily. NEEDS APPOINTMENT FOR FUTURE REFILLS 06/07/17   Croitoru, Dani Gobble, MD  Polyethyl Glycol-Propyl Glycol (SYSTANE OP) Place 1 drop into both eyes 3 (three) times daily. Patient states its the Gel form (soothes)    [provider]  predniSONE (DELTASONE) 10 MG tablet Take 50 mg tablet on 1/24 and taper down by 10 mg daily until completed 06/16/16   Theodis Blaze, MD  Probiotic CAPS Take 1 capsule by mouth daily with breakfast.    [provider]  rosuvastatin (CRESTOR) 20 MG tablet TAKE ONE TABLET BY MOUTH DAILY 03/18/17   Croitoru, Dani Gobble, MD  sertraline (ZOLOFT) 50 MG tablet Take 1 tablet (50 mg total) by mouth daily. 04/09/16   Croitoru, Mihai, MD  solifenacin (VESICARE) 5 MG tablet Take 1 tablet (5 mg total) by mouth daily. Patient taking differently: Take 5 mg by mouth every evening.  10/09/15   Theora Gianotti, NP    Family History Family History  Problem Relation Age of Onset  . Heart disease Mother     Social History Social History   Tobacco  Use  . Smoking status:  Never Smoker  . Smokeless tobacco: Never Used  Substance Use Topics  . Alcohol use: No  . Drug use: No     Allergies   Sulfa antibiotics and Sulfonamide derivatives   Review of Systems Review of Systems  Respiratory: Negative for shortness of breath.   Cardiovascular: Negative for chest pain.  Gastrointestinal: Negative for abdominal pain.  Neurological: Negative for headaches.  All other systems reviewed and are negative.    Physical Exam Updated Vital Signs BP (!) 150/62   Pulse 61   Temp 98.3 F (36.8 C) (Oral)   Resp 19   Ht 5\' 1"  (1.549 m)   Wt 78 kg (172 lb)   SpO2 95%   BMI 32.50 kg/m   Physical Exam  Constitutional: She is oriented to person, place, and time. She appears well-developed and well-nourished. No distress.  HENT:  Head: Normocephalic and atraumatic.  Mouth/Throat: Oropharynx is clear and moist.  Eyes: Conjunctivae and EOM are normal. Pupils are equal, round, and reactive to light.  Neck: Normal range of motion. Neck supple.  Cardiovascular: Normal rate, regular rhythm and intact distal pulses.  No murmur heard. Pulmonary/Chest: Effort normal and breath sounds normal. No respiratory distress. She has no wheezes. She has no rales.  Abdominal: Soft. She exhibits no distension. There is no tenderness. There is no rebound and no guarding.  Musculoskeletal: Normal range of motion. She exhibits tenderness. She exhibits no edema.       Left hip: She exhibits normal range of motion.       Legs: Neurological: She is alert and oriented to person, place, and time.  Skin: Skin is warm and dry. No rash noted. No erythema.  Psychiatric: She has a normal mood and affect. Her behavior is normal.  Nursing note and vitals reviewed.    ED Treatments / Results  Labs (all labs ordered are listed, but only abnormal results are displayed) Labs Reviewed - No data to display  EKG  EKG Interpretation None       Radiology Ct Hip Left Wo Contrast  Result  Date: 06/08/2017 CLINICAL DATA:  Left hip pain status post fall today. EXAM: CT OF THE LEFT HIP WITHOUT CONTRAST TECHNIQUE: Multidetector CT imaging of the left hip was performed according to the standard protocol. Multiplanar CT image reconstructions were also generated. COMPARISON:  None. FINDINGS: Bones/Joint/Cartilage Left total hip arthroplasty without hardware failure or complication. No periarticular fluid collection or osteolysis. Nondisplaced fracture of the left posterior acetabulum adjacent to the acetabular cup component of the prosthesis. Comminuted and displaced left inferior pubic ramus fracture. Normal alignment. No joint effusion. No aggressive osseous lesion. Ligaments Ligaments are suboptimally evaluated by CT. Muscles and Tendons Small intramuscular hematoma in the left obturator externus muscle. No other focal muscle signal abnormality. Soft tissue No fluid collection or hematoma.  No soft tissue mass. IMPRESSION: 1. Comminuted and displaced left inferior pubic ramus fracture. 2. Nondisplaced fracture of the left posterior acetabulum adjacent to the acetabular cup component of the prosthesis. Electronically Signed   By: Kathreen Devoid   On: 06/08/2017 15:37   Dg Hip Unilat W Or Wo Pelvis 2-3 Views Left  Result Date: 06/08/2017 CLINICAL DATA:  Pt states she fell in her bathroom this a.m. because her left foot went to sleep and when she went to bear weight on her foot, she fell down onto her left hip. EXAM: DG HIP (WITH OR WITHOUT PELVIS) 2-3V LEFT COMPARISON:  CT abdomen  and pelvis dated 12/11/2008. FINDINGS: Left hip arthroplasty hardware appears intact and appropriately positioned. No fracture line or displaced fracture fragment seen about the left acetabulum or within the proximal left femur. There is a displaced fracture deformity of the left inferior pubic ramus, of uncertain age, new compared to the CT of 12/11/2008. Remainder of the osseous pelvis appears intact and normally aligned.  Degenerative changes noted at the right hip, at least moderate in degree with joint space narrowing and osseous spurring. IMPRESSION: 1. Displaced fracture deformity of the left inferior pubic ramus, of uncertain age, new compared to most recent comparison imaging which is a CT of 12/11/2008. Favor chronic but of uncertain age. 2. Left hip arthroplasty hardware appears intact and appropriately positioned. No fracture seen about the left acetabulum or within the proximal left femur. 3. Degenerative osteoarthritis at the right hip joint, moderate in degree. Electronically Signed   By: Franki Cabot M.D.   On: 06/08/2017 14:11    Procedures Procedures (including critical care time)  Medications Ordered in ED Medications  HYDROcodone-acetaminophen (NORCO/VICODIN) 5-325 MG per tablet 1 tablet (1 tablet Oral Given 06/08/17 1328)     Initial Impression / Assessment and Plan / ED Course  I have reviewed the triage vital signs and the nursing notes.  Pertinent labs & imaging results that were available during my care of the patient were reviewed by me and considered in my medical decision making (see chart for details).    Patient with fall this morning that was mechanical with concern for possible pelvic fracture.  Low suspicion for prosthetic dislocation and low suspicion for femur fracture.  Left hip imaging is pending and patient given oral pain control as she appears fairly comfortable.  Patient is able to bend her knees and lift her buttocks off the bed but it is painful.  2:35 PM Plain film without evidence of acute fracture however will do a CT to rule out occult fracture  4:07 PM CT showed an acute comminuted and displaced left inferior pubic ramus fracture as well as a nondisplaced fracture of the left posterior acetabulum.  Prosthesis appears intact.  Will discuss with patient's orthopedist.  Patient's pain is significantly improved after 1 Vicodin.  5:09 PM Discussed with Dr. Marlou Sa who  will come see the pt.  Final Clinical Impressions(s) / ED Diagnoses   Final diagnoses:  Closed nondisplaced fracture of posterior wall of left acetabulum, initial encounter ([place])  Closed fracture of left inferior pubic ramus, initial encounter [facility])    ED Discharge Orders    None       Blanchie Dessert, MD 06/08/17 1710

## 2017-06-08 NOTE — ED Notes (Signed)
Pt placed on a fracture pan.  Pt tolerated log-rolling very well.

## 2017-06-08 NOTE — ED Notes (Signed)
Patient transported to CT 

## 2017-06-08 NOTE — ED Notes (Signed)
Pt returned from CT.  Juice given to pt, okayed by [facility] EDP.  Family at bedside.

## 2017-06-08 NOTE — ED Notes (Signed)
Hospitalist at bedside,

## 2017-06-08 NOTE — H&P (Signed)
[facility] History and Physical  Gina Bowers YWV:[id number] DOB: 05/05/1926 DOA: 06/08/2017  Referring physician:  PCP: Lavone Orn, MD   Chief Complaint: "I fell because my foot fell asleep."  HPI: Gina Bowers is a 84 y.o. female with past medical history significant for osteoporosis, heart failure, coronary artery disease status post CABG, low thyroid, high cholesterol presents to the emergency room with hip and shoulder pain.  Patient states that she has been in her normal state of health.  Has not been feeling ill.  Has bladder issues that require her to go to the bathroom often.  Woke up this morning to go to the bathroom.  States that normally once on the commode it takes a while to urinate.  After 10 minutes patient's foot was numb.  She decided to use her walker and leave the restroom.  When she stood up her left leg went from under her.  Patient then fell on her bottom and hit her head and L shoulder. No LOC.  Patient cried out for help and banged on walls but no one came to her aid.  Later in the day her home health worker found her in the floor and EMS was activated.  They lifted her onto a stretcher and transported her to the emergency room.  ED course: X-ray hip showed pubic rami fracture.  CT hip done which shows nondisplaced fracture of the left posterior acetabulum adjacent to prosthesis.  EDP contacted orthopedics who advised patient be nonweightbearing.  Can be discharged if she can pivot.  Patient unable to ambulate to any degree.  Hospitalist consulted for admission.   Review of Systems:  As per HPI otherwise 10 point review of systems negative.    Past Medical History:  Diagnosis Date  . Arthritis   . Blood transfusion 2007   AFTER HIP REPLACEMENT  . Carotid bruit    PT'S DAUGHTER STATES PT HAD RECENT CAROTID STUDY AND WAS TOLD NO SIGNIFICANT BLOCKAGES  . Chronic diastolic CHF (congestive heart failure) ([place])    a. 07/2015 Ech: Ef 60-65%, Gr1 DD.  Marland Kitchen  Coronary artery disease    a. 2009 CABG 4, Dr. Servando Snare (LIMA to LAD , SVG to diagonal, SVG to circumflex, SVG to PDA); b. Cath 2012 in  Recent ill,[place] patent LIMA , Patent SVG to diagonal, occluded SVG to RCA. 5x60mm BMS->native RCA; c. 09/2015 Cath: LM nl, LAD 162m, D1 100, LCX small, OM1 90/small, OM2 70, RCA 45p, patent stent, VG->dRCA 100, VG->OM1 100, VG->Diag nl, LIMA->LAD nl->Med Rx.  . Depression   . Fracture FEB 2013   FRACTURED LEFT ANKLE--NO SURGERY--WAS IN BOOT UNTIL COUPLE WEEKS AGO  . GERD (gastroesophageal reflux disease)   . High cholesterol   . Hypothyroidism   . Ischemic colitis ([place]) 2010  . Kidney stones    IN THE PAST  . 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 [place] virus    COUPLE OF WEEKS AGO--ALL SYMTOMS RESOLVED PER PT  . Obstructive sleep apnea 2010  . Shortness of breath    AND WHEEZING AT TIMES--NO INHALERS  . Stroke ([place])    SEVERAL STROKES -TOLD SHE HAS CEREBELLUM BLOCKAGE AS RESULT OF STROKE--BUT NEUROLOGIST SAID HE DID NOT WANT TO DO ANY PROCEDURE BECAUSE OF HER AGE .  PT HAS SLIGHT LEFT FOOT DROP-DRAGS FOOT WHEN WALKING - USES WALKER  . Urinary incontinence   . UTI (lower urinary tract infection)    HX OF UTI'S-- LAST  TIME COUPLE OF MONTHS AGO  . Vertigo    Past Surgical History:  Procedure Laterality Date  . ABDOMINAL HYSTERECTOMY    . APPENDECTOMY    . [place] AND 1990  . CARDIAC CATHETERIZATION N/A 10/07/2015   Procedure: Left Heart Cath and Cors/Grafts Angiography;  Surgeon: Peter M Martinique, MD;  Location: [facility];  Service: Cardiovascular;  Laterality: N/A;  . CATARACT EXTRACTION    . CORONARY ANGIOPLASTY  10/2010   WITH STENT PLACEMENT  . CORONARY ARTERY BYPASS GRAFT  2009  . CYSTOSCOPY WITH INJECTION  09/21/2011   Procedure: CYSTOSCOPY WITH INJECTION;  Surgeon: Ailene Rud, MD;  Location: [facility];  Service: Urology;  Laterality: N/A;  Peri Urethral Macroplastique Injection and Estring  Placement  . HERNIA REPAIR    . JOINT REPLACEMENT  2007   HIP REPLACEMENT -LEFT  . TONSILLECTOMY     Social History:  reports that  has never smoked. she has never used smokeless tobacco. She reports that she does not drink alcohol or use drugs.  Allergies  Allergen Reactions  . Sulfa Antibiotics Hives  . Sulfonamide Derivatives Hives    Family History  Problem Relation Age of Onset  . Heart disease Mother      Prior to Admission medications   Medication Sig Start Date End Date Taking? Authorizing Provider  acetaminophen (TYLENOL) 500 MG tablet Take 1,000 mg by mouth every 6 (six) hours as needed for mild pain or fever.    Yes [provider]  Albuterol Sulfate (PROAIR RESPICLICK) 951 (90 Base) MCG/ACT AEPB Inhale 2 puffs into the lungs daily as needed (shortness of breath).   Yes [provider]  alendronate (FOSAMAX) 70 MG tablet Take 1 tablet (70 mg total) by mouth once a week. Take with a full glass of water on an empty stomach.mon Patient taking differently: Take 70 mg by mouth every Monday. Take with a full glass of water on an empty stomach 10/08/15  Yes Ilean China  aspirin EC 81 MG tablet Take 81 mg by mouth every evening.    Yes [provider]  Calcium Carb-Cholecalciferol (CALCIUM 1000 + D PO) Take 1 tablet by mouth every evening.    Yes [provider]  cephALEXin (KEFLEX) 250 MG capsule Take 250 mg by mouth every evening. 01/21/17 01/21/18 Yes [provider]  clopidogrel (PLAVIX) 75 MG tablet Take 1 tablet (75 mg total) by mouth daily. 06/06/14  Yes Croitoru, Mihai, MD  CRANBERRY EXTRACT PO Take 500 mg by mouth 2 (two) times daily.    Yes [provider]  fish oil-omega-3 fatty acids 1000 MG capsule Take 1 g by mouth every evening.    Yes [provider]  ipratropium-albuterol (DUONEB) 0.5-2.5 (3) MG/3ML SOLN Take 3 mLs by nebulization every 4 (four) hours as needed. 06/16/16  Yes Theodis Blaze, MD    isosorbide mononitrate (IMDUR) 30 MG 24 hr tablet TAKE 1/2 TABLET (15MG  TOTAL) BY MOUTH DAILY 12/11/16  Yes Croitoru, Mihai, MD  levothyroxine (SYNTHROID, LEVOTHROID) 75 MCG tablet Take 1 tablet (75 mcg total) by mouth daily before breakfast. 06/06/14  Yes Croitoru, Mihai, MD  losartan (COZAAR) 25 MG tablet TAKE ONE TABLET BY MOUTH TWICE A DAY 01/06/17  Yes Croitoru, Mihai, MD  meclizine (ANTIVERT) 25 MG tablet Take 25 mg by mouth 3 (three) times daily as needed for dizziness.   Yes [provider]  metoprolol succinate (TOPROL-XL) 25 MG 24  hr tablet TAKE ONE TABLET BY MOUTH DAILY 05/14/17  Yes Crenshaw, Denice Bors, MD  Multiple Vitamin (MULITIVITAMIN WITH MINERALS) TABS Take 1 tablet by mouth daily with breakfast.   Yes [provider]  nitroGLYCERIN (NITROSTAT) 0.4 MG SL tablet Place 1 tablet (0.4 mg total) under the tongue every 5 (five) minutes as needed for chest pain. 10/08/15  Yes Kilroy, Luke K, PA-C  ondansetron (ZOFRAN ODT) 4 MG disintegrating tablet Take one every 6 hours for nausea Patient taking differently: Take 4 mg by mouth every 6 (six) hours as needed for nausea or vomiting.  06/06/14  Yes Croitoru, Dani Gobble, MD  OVER THE COUNTER MEDICATION See admin instructions. Immuplex Capsule: Take 1 capsule by mouth 2 times a day   Yes [provider]  OVER THE COUNTER MEDICATION Take 1 capsule by mouth 2 (two) times daily. D-MANOS   Yes [provider]  pantoprazole (PROTONIX) 40 MG tablet Take 1 tablet (40 mg total) by mouth daily. NEEDS APPOINTMENT FOR FUTURE REFILLS 06/07/17  Yes Croitoru, Mihai, MD  Polyethyl Glycol-Propyl Glycol (SYSTANE OP) Place 1 drop into both eyes 2 (two) times daily. Patient states its the Gel form (soothes)   Yes [provider]  Probiotic CAPS Take 1 capsule by mouth daily with breakfast.   Yes [provider]  rosuvastatin (CRESTOR) 20 MG tablet TAKE ONE TABLET BY MOUTH DAILY 03/18/17  Yes Croitoru, Mihai, MD   sertraline (ZOLOFT) 50 MG tablet Take 1 tablet (50 mg total) by mouth daily. 04/09/16  Yes Croitoru, Mihai, MD  solifenacin (VESICARE) 5 MG tablet Take 1 tablet (5 mg total) by mouth daily. Patient taking differently: Take 5 mg by mouth every evening.  10/09/15  Yes Theora Gianotti, NP   Physical Exam: Vitals:   06/08/17 1600 06/08/17 1615 06/08/17 1630 06/08/17 1645  BP: (!) 147/96 (!) 138/92 (!) 136/56 130/65  Pulse: 63 63 63 60  Resp: 15 16 19 19   Temp:      TempSrc:      SpO2: 94% 95% 92% 93%  Weight:      Height:        Wt Readings from Last 3 Encounters:  06/08/17 78 kg (172 lb)  07/21/16 78.4 kg (172 lb 12.8 oz)  06/19/16 80.6 kg (177 lb 11.1 oz)    General:  Appears calm and comfortable; A&Ox3 Eyes:  PERRL, EOMI, normal lids, iris ENT:  grossly normal hearing, lips & tongue Neck:  no LAD, masses or thyromegaly Cardiovascular:  RRR, no m/r/g. No LE edema.  Respiratory:  CTA bilaterally, no w/r/r. Normal respiratory effort. Abdomen:  soft, ntnd Skin:  no rash or induration seen on limited exam; bruising L lateral shoulder Musculoskeletal:  grossly normal tone BUE/BLE; fibular head tenderness L, L hip tenderness; C7/T1 spinous process tenderness but nothing below, no chest wall tenderness Psychiatric:  grossly normal mood and affect, speech fluent and appropriate Neurologic:  CN 2-12 grossly intact, moves all extremities in coordinated fashion.          Labs on Admission:  Basic Metabolic Panel: No results for input(s): NA, K, CL, CO2, GLUCOSE, BUN, CREATININE, CALCIUM, MG, PHOS in the last 168 hours. Liver Function Tests: No results for input(s): AST, ALT, ALKPHOS, BILITOT, PROT, ALBUMIN in the last 168 hours. No results for input(s): LIPASE, AMYLASE in the last 168 hours. No results for input(s): AMMONIA in the last 168 hours. CBC: Recent Labs  Lab 06/08/17 1911  WBC 8.9  NEUTROABS 5.9  HGB  12.3  HCT 36.8  MCV 94.1  PLT 201   Cardiac  Enzymes: No results for input(s): CKTOTAL, CKMB, CKMBINDEX, TROPONINI in the last 168 hours.  BNP (last 3 results) No results for input(s): BNP in the last 8760 hours.  ProBNP (last 3 results) No results for input(s): PROBNP in the last 8760 hours.   Creatinine clearance cannot be calculated (Patient's most recent lab result is older than the maximum 21 days allowed.)  CBG: No results for input(s): GLUCAP in the last 168 hours.  Radiological Exams on Admission: Ct Hip Left Wo Contrast  Result Date: 06/08/2017 CLINICAL DATA:  Left hip pain status post fall today. EXAM: CT OF THE LEFT HIP WITHOUT CONTRAST TECHNIQUE: Multidetector CT imaging of the left hip was performed according to the standard protocol. Multiplanar CT image reconstructions were also generated. COMPARISON:  None. FINDINGS: Bones/Joint/Cartilage Left total hip arthroplasty without hardware failure or complication. No periarticular fluid collection or osteolysis. Nondisplaced fracture of the left posterior acetabulum adjacent to the acetabular cup component of the prosthesis. Comminuted and displaced left inferior pubic ramus fracture. Normal alignment. No joint effusion. No aggressive osseous lesion. Ligaments Ligaments are suboptimally evaluated by CT. Muscles and Tendons Small intramuscular hematoma in the left obturator externus muscle. No other focal muscle signal abnormality. Soft tissue No fluid collection or hematoma.  No soft tissue mass. IMPRESSION: 1. Comminuted and displaced left inferior pubic ramus fracture. 2. Nondisplaced fracture of the left posterior acetabulum adjacent to the acetabular cup component of the prosthesis. Electronically Signed   By: Kathreen Devoid   On: 06/08/2017 15:37   Dg Hip Unilat W Or Wo Pelvis 2-3 Views Left  Result Date: 06/08/2017 CLINICAL DATA:  Pt states she fell in her bathroom this a.m. because her left foot went to sleep and when she went to bear weight on her foot, she fell down onto  her left hip. EXAM: DG HIP (WITH OR WITHOUT PELVIS) 2-3V LEFT COMPARISON:  CT abdomen and pelvis dated 12/11/2008. FINDINGS: Left hip arthroplasty hardware appears intact and appropriately positioned. No fracture line or displaced fracture fragment seen about the left acetabulum or within the proximal left femur. There is a displaced fracture deformity of the left inferior pubic ramus, of uncertain age, new compared to the CT of 12/11/2008. Remainder of the osseous pelvis appears intact and normally aligned. Degenerative changes noted at the right hip, at least moderate in degree with joint space narrowing and osseous spurring. IMPRESSION: 1. Displaced fracture deformity of the left inferior pubic ramus, of uncertain age, new compared to most recent comparison imaging which is a CT of 12/11/2008. Favor chronic but of uncertain age. 2. Left hip arthroplasty hardware appears intact and appropriately positioned. No fracture seen about the left acetabulum or within the proximal left femur. 3. Degenerative osteoarthritis at the right hip joint, moderate in degree. Electronically Signed   By: Franki Cabot M.D.   On: 06/08/2017 14:11    EKG: pending  Assessment/Plan Principal Problem:   Fall at home, initial encounter Active Problems:   Pubic ramus fracture ([place])   Acetabulum fracture ([place])  Fall CT of the head and C-spine ordered X-ray knee to evaluate fibular head ordered X-ray of the shoulder ordered EDP had consulted orthopedics will see patient in a.m.  Hypertension When necessary hydralazine 10 mg IV as needed for severe blood pressure Cont cozaar. toprol  CAD Cont asa and plavix & imdur Prn ntg sl  SOB chronic Duoneb prn  UTI prevention Cont  keflex  Osteoporosis Fosfamax on Monday if still inpy  Hypothyroidism Cont OP synthroid 75 mcg qd No signs of hyper or hypothyroidism  GERD Cont PPI  Hyperlipidemia Continue statin  Depression/Anxiety No SI/HI Cont  zoloft  Bladder Disorder Cont vesicare-> enablex  Code Status: DNR DVT Prophylaxis: lovenox Family Communication: dgtr at bedside Disposition Plan: Pending Improvement  Status: tele, obs  Elwin Mocha, MD Family Medicine [facility] [email or web address] Password [email or web address]

## 2017-06-08 NOTE — ED Triage Notes (Signed)
Pt in from [place] with c/o L hip pain after fall today. Pt states she was on commode, and L leg fell asleep. When she stood, using walker - she hit head on wall (denies LOC) and landed on L hip. Takes Plavix, no obvious deformity, good pedal pulses. Pain to L posterior hip/butt. Fell at 0800, on floor x 4 hrs before staff found her

## 2017-06-08 NOTE — ED Notes (Addendum)
[facility] EDP went to speak to pt and family to discuss plan of care and her options.  Family members and pt decided for admission d/t strict no weight bearing with her L leg for 6 weeks.  Family states that it will be difficult for them to take care of pt at home.  Juice and sandwich given to pt.

## 2017-06-09 ENCOUNTER — Encounter (HOSPITAL_COMMUNITY): Payer: Self-pay

## 2017-06-09 ENCOUNTER — Other Ambulatory Visit: Payer: Self-pay

## 2017-06-09 DIAGNOSIS — R52 Pain, unspecified: Secondary | ICD-10-CM | POA: Diagnosis not present

## 2017-06-09 DIAGNOSIS — E785 Hyperlipidemia, unspecified: Secondary | ICD-10-CM | POA: Diagnosis present

## 2017-06-09 DIAGNOSIS — E569 Vitamin deficiency, unspecified: Secondary | ICD-10-CM | POA: Diagnosis not present

## 2017-06-09 DIAGNOSIS — K219 Gastro-esophageal reflux disease without esophagitis: Secondary | ICD-10-CM | POA: Diagnosis present

## 2017-06-09 DIAGNOSIS — Z955 Presence of coronary angioplasty implant and graft: Secondary | ICD-10-CM | POA: Diagnosis not present

## 2017-06-09 DIAGNOSIS — R42 Dizziness and giddiness: Secondary | ICD-10-CM | POA: Diagnosis not present

## 2017-06-09 DIAGNOSIS — Z7901 Long term (current) use of anticoagulants: Secondary | ICD-10-CM | POA: Diagnosis not present

## 2017-06-09 DIAGNOSIS — E871 Hypo-osmolality and hyponatremia: Secondary | ICD-10-CM | POA: Diagnosis present

## 2017-06-09 DIAGNOSIS — H04129 Dry eye syndrome of unspecified lacrimal gland: Secondary | ICD-10-CM | POA: Diagnosis not present

## 2017-06-09 DIAGNOSIS — R062 Wheezing: Secondary | ICD-10-CM | POA: Diagnosis not present

## 2017-06-09 DIAGNOSIS — Z7982 Long term (current) use of aspirin: Secondary | ICD-10-CM | POA: Diagnosis not present

## 2017-06-09 DIAGNOSIS — M79662 Pain in left lower leg: Secondary | ICD-10-CM | POA: Diagnosis not present

## 2017-06-09 DIAGNOSIS — R0602 Shortness of breath: Secondary | ICD-10-CM | POA: Diagnosis not present

## 2017-06-09 DIAGNOSIS — F411 Generalized anxiety disorder: Secondary | ICD-10-CM | POA: Diagnosis not present

## 2017-06-09 DIAGNOSIS — Z96642 Presence of left artificial hip joint: Secondary | ICD-10-CM | POA: Diagnosis present

## 2017-06-09 DIAGNOSIS — Z79899 Other long term (current) drug therapy: Secondary | ICD-10-CM | POA: Diagnosis not present

## 2017-06-09 DIAGNOSIS — R110 Nausea: Secondary | ICD-10-CM | POA: Diagnosis not present

## 2017-06-09 DIAGNOSIS — S32592D Other specified fracture of left pubis, subsequent encounter for fracture with routine healing: Secondary | ICD-10-CM | POA: Diagnosis not present

## 2017-06-09 DIAGNOSIS — B965 Pseudomonas (aeruginosa) (mallei) (pseudomallei) as the cause of diseases classified elsewhere: Secondary | ICD-10-CM | POA: Diagnosis present

## 2017-06-09 DIAGNOSIS — Z66 Do not resuscitate: Secondary | ICD-10-CM | POA: Diagnosis present

## 2017-06-09 DIAGNOSIS — Z792 Long term (current) use of antibiotics: Secondary | ICD-10-CM | POA: Diagnosis not present

## 2017-06-09 DIAGNOSIS — S32402A Unspecified fracture of left acetabulum, initial encounter for closed fracture: Secondary | ICD-10-CM | POA: Diagnosis present

## 2017-06-09 DIAGNOSIS — M6281 Muscle weakness (generalized): Secondary | ICD-10-CM | POA: Diagnosis not present

## 2017-06-09 DIAGNOSIS — Z111 Encounter for screening for respiratory tuberculosis: Secondary | ICD-10-CM | POA: Diagnosis not present

## 2017-06-09 DIAGNOSIS — E7849 Other hyperlipidemia: Secondary | ICD-10-CM | POA: Diagnosis not present

## 2017-06-09 DIAGNOSIS — W19XXXA Unspecified fall, initial encounter: Secondary | ICD-10-CM | POA: Diagnosis not present

## 2017-06-09 DIAGNOSIS — E7800 Pure hypercholesterolemia, unspecified: Secondary | ICD-10-CM | POA: Diagnosis present

## 2017-06-09 DIAGNOSIS — S32425A Nondisplaced fracture of posterior wall of left acetabulum, initial encounter for closed fracture: Secondary | ICD-10-CM | POA: Diagnosis not present

## 2017-06-09 DIAGNOSIS — R2689 Other abnormalities of gait and mobility: Secondary | ICD-10-CM | POA: Diagnosis not present

## 2017-06-09 DIAGNOSIS — E039 Hypothyroidism, unspecified: Secondary | ICD-10-CM | POA: Diagnosis not present

## 2017-06-09 DIAGNOSIS — I110 Hypertensive heart disease with heart failure: Secondary | ICD-10-CM | POA: Diagnosis present

## 2017-06-09 DIAGNOSIS — I10 Essential (primary) hypertension: Secondary | ICD-10-CM | POA: Diagnosis not present

## 2017-06-09 DIAGNOSIS — K5900 Constipation, unspecified: Secondary | ICD-10-CM | POA: Diagnosis not present

## 2017-06-09 DIAGNOSIS — S32425D Nondisplaced fracture of posterior wall of left acetabulum, subsequent encounter for fracture with routine healing: Secondary | ICD-10-CM | POA: Diagnosis not present

## 2017-06-09 DIAGNOSIS — F329 Major depressive disorder, single episode, unspecified: Secondary | ICD-10-CM | POA: Diagnosis present

## 2017-06-09 DIAGNOSIS — Z882 Allergy status to sulfonamides status: Secondary | ICD-10-CM | POA: Diagnosis not present

## 2017-06-09 DIAGNOSIS — F419 Anxiety disorder, unspecified: Secondary | ICD-10-CM | POA: Diagnosis present

## 2017-06-09 DIAGNOSIS — S3993XA Unspecified injury of pelvis, initial encounter: Secondary | ICD-10-CM | POA: Diagnosis not present

## 2017-06-09 DIAGNOSIS — I2510 Atherosclerotic heart disease of native coronary artery without angina pectoris: Secondary | ICD-10-CM | POA: Diagnosis present

## 2017-06-09 DIAGNOSIS — N390 Urinary tract infection, site not specified: Secondary | ICD-10-CM | POA: Diagnosis present

## 2017-06-09 DIAGNOSIS — R05 Cough: Secondary | ICD-10-CM | POA: Diagnosis not present

## 2017-06-09 DIAGNOSIS — S32592A Other specified fracture of left pubis, initial encounter for closed fracture: Principal | ICD-10-CM

## 2017-06-09 DIAGNOSIS — Z9181 History of falling: Secondary | ICD-10-CM | POA: Diagnosis not present

## 2017-06-09 DIAGNOSIS — I509 Heart failure, unspecified: Secondary | ICD-10-CM | POA: Diagnosis not present

## 2017-06-09 DIAGNOSIS — I5032 Chronic diastolic (congestive) heart failure: Secondary | ICD-10-CM | POA: Diagnosis present

## 2017-06-09 DIAGNOSIS — Y92002 Bathroom of unspecified non-institutional (private) residence single-family (private) house as the place of occurrence of the external cause: Secondary | ICD-10-CM | POA: Diagnosis not present

## 2017-06-09 DIAGNOSIS — N329 Bladder disorder, unspecified: Secondary | ICD-10-CM | POA: Diagnosis not present

## 2017-06-09 DIAGNOSIS — Y92009 Unspecified place in unspecified non-institutional (private) residence as the place of occurrence of the external cause: Secondary | ICD-10-CM | POA: Diagnosis not present

## 2017-06-09 DIAGNOSIS — G8911 Acute pain due to trauma: Secondary | ICD-10-CM | POA: Diagnosis not present

## 2017-06-09 DIAGNOSIS — G4733 Obstructive sleep apnea (adult) (pediatric): Secondary | ICD-10-CM | POA: Diagnosis present

## 2017-06-09 DIAGNOSIS — M810 Age-related osteoporosis without current pathological fracture: Secondary | ICD-10-CM | POA: Diagnosis present

## 2017-06-09 LAB — URINALYSIS, ROUTINE W REFLEX MICROSCOPIC
Bilirubin Urine: NEGATIVE
Glucose, UA: NEGATIVE mg/dL
Hgb urine dipstick: NEGATIVE
KETONES UR: NEGATIVE mg/dL
Nitrite: POSITIVE — AB
PROTEIN: NEGATIVE mg/dL
Specific Gravity, Urine: 1.018 (ref 1.005–1.030)
pH: 5 (ref 5.0–8.0)

## 2017-06-09 LAB — BASIC METABOLIC PANEL
ANION GAP: 9 (ref 5–15)
BUN: 10 mg/dL (ref 6–20)
CHLORIDE: 99 mmol/L — AB (ref 101–111)
CO2: 24 mmol/L (ref 22–32)
Calcium: 9 mg/dL (ref 8.9–10.3)
Creatinine, Ser: 0.53 mg/dL (ref 0.44–1.00)
GFR calc Af Amer: >60 mL/min (ref >60)
GFR calc non Af Amer: >60 mL/min (ref >60)
GLUCOSE: 111 mg/dL — AB (ref 65–99)
POTASSIUM: 4.1 mmol/L (ref 3.5–5.1)
Sodium: 132 mmol/L — ABNORMAL LOW (ref 135–145)

## 2017-06-09 LAB — CBC
HEMATOCRIT: 35.1 % — AB (ref 36.0–46.0)
HEMOGLOBIN: 11.6 g/dL — AB (ref 12.0–15.0)
MCH: 31.2 pg (ref 26.0–34.0)
MCHC: 33 g/dL (ref 30.0–36.0)
MCV: 94.4 fL (ref 78.0–100.0)
Platelets: 198 10*3/uL (ref 150–400)
RBC: 3.72 MIL/uL — ABNORMAL LOW (ref 3.87–5.11)
RDW: 13.2 % (ref 11.5–15.5)
WBC: 10.2 10*3/uL (ref 4.0–10.5)

## 2017-06-09 LAB — T4, FREE: FREE T4: 0.94 ng/dL (ref 0.61–1.12)

## 2017-06-09 MED ORDER — MORPHINE SULFATE (PF) 4 MG/ML IV SOLN
1.0000 mg | INTRAVENOUS | Status: DC | PRN
  Administered 2017-06-09 – 2017-06-10 (×6): 1 mg via INTRAVENOUS
  Filled 2017-06-09 (×7): qty 1

## 2017-06-09 MED ORDER — SODIUM CHLORIDE 0.9 % IV SOLN
INTRAVENOUS | Status: DC
  Administered 2017-06-10: 03:00:00 via INTRAVENOUS

## 2017-06-09 NOTE — Progress Notes (Signed)
Subjective:    Patient reports pain as mild.    Objective: Vital signs in last 24 hours: Temp:  [98.2 F (36.8 C)-99.4 F (37.4 C)] 98.2 F (36.8 C) (01/16 0500) Pulse Rate:  [49-72] 65 (01/16 0500) Resp:  [13-24] 15 (01/16 0500) BP: (68-172)/(32-97) 126/46 (01/16 0500) SpO2:  [91 %-98 %] 92 % (01/16 0500) Weight:  [165 lb (74.8 kg)-172 lb (78 kg)] 172 lb (78 kg) (01/15 1228)  Intake/Output from previous day: 01/15 0701 - 01/16 0700 In: -  Out: 200 [Urine:200] Intake/Output this shift: No intake/output data recorded.  Recent Labs    06/08/17 1911 06/09/17 0444  HGB 12.3 11.6*   Recent Labs    06/08/17 1911 06/09/17 0444  WBC 8.9 10.2  RBC 3.91 3.72*  HCT 36.8 35.1*  PLT 201 198   Recent Labs    06/08/17 1911 06/09/17 0444  NA 133* 132*  K 3.6 4.1  CL 97* 99*  CO2 26 24  BUN 10 10  CREATININE 0.54 0.53  GLUCOSE 113* 111*  CALCIUM 9.0 9.0   No results for input(s): LABPT, INR in the last 72 hours.  Neurologically intact Neurovascular intact Compartment soft  Assessment/Plan:long discussion with Mrs Luebbe and daughter re NWBing LLE for about 6 weeks. Might be better to be in rehab during that time, Will consult social services. Films demonstrate non displaced acetabular fractures about a left THR. OK for bed to chair. Comfortable this am    Discharge to SNF  [place] 06/09/2017, 7:48 AM

## 2017-06-09 NOTE — Evaluation (Signed)
Physical Therapy Evaluation Patient Details Name: Gina Bowers MRN: [id number] DOB: [birth year] Today's Date: 06/09/2017   History of Present Illness  Pt is an 84 y/o female who presents s/p fall at home. She sustained a pubic rami fracture, and L non-displaced fracture of L posterior acetabulum adjacent to prosthesis from prior THA. She is currently NWB on the LLE. PMH significant for vertigo, UTI, CVA, CAD, CHF.  Clinical Impression  Pt admitted with above diagnosis. Pt currently with functional limitations due to the deficits listed below (see PT Problem List). At the time of PT eval pt was able to perform transfers with +2 max assist for balance support and safety. Therapist maintained foot under pt's LLE to ensure maintenance of NWB status. Pt will benefit from skilled PT to increase their independence and safety with mobility to allow discharge to the venue listed below.       Follow Up Recommendations SNF;Supervision/Assistance - 24 hour    Equipment Recommendations  Rolling walker with 5" wheels    Recommendations for Other Services       Precautions / Restrictions Precautions Precautions: Fall Restrictions Weight Bearing Restrictions: Yes LLE Weight Bearing: Non weight bearing      Mobility  Bed Mobility Overal bed mobility: Needs Assistance Bed Mobility: Supine to Sit     Supine to sit: Min assist;+2 for physical assistance     General bed mobility comments: Assist for LE advacement towards EOB, as well as for trunk elevation to full sitting. Increased time and use of bed pad to assist with scooting required.   Transfers Overall transfer level: Needs assistance Equipment used: 2 person hand held assist Transfers: Sit to/from [employer] Sit to Stand: Max assist;+2 physical assistance   Squat pivot transfers: Max assist;+2 physical assistance     General transfer comment: Assist for power-up to full stand as well as for pivot around to chair.  Bed pad under hips for increased support.   Ambulation/Gait             General Gait Details: Unable  Stairs            Wheelchair Mobility    Modified Rankin (Stroke Patients Only)       Balance Overall balance assessment: Needs assistance Sitting-balance support: Feet supported;No upper extremity supported Sitting balance-Leahy Scale: Fair     Standing balance support: Bilateral upper extremity supported Standing balance-Leahy Scale: Zero Standing balance comment: +2 required                             Pertinent Vitals/Pain Pain Assessment: 0-10 Pain Score: 6  Pain Location: LLE    Home Living Family/patient expects to be discharged to:: Private residence Living Arrangements: Alone                    Prior Function Level of Independence: Needs assistance         Comments: Uses the rollator all the time. Shower chair at home. Aide comes in 3 times a day to assist with ADL's, meds, and meals (supervision for meals as pt has difficulty swallowing at times)     Hand Dominance   Dominant Hand: Right    Extremity/Trunk Assessment   Upper Extremity Assessment Upper Extremity Assessment: Defer to OT evaluation    Lower Extremity Assessment Lower Extremity Assessment: LLE deficits/detail;RLE deficits/detail RLE Deficits / Details: Pt indicates pain and difficulty moving RLE - possibly due to  pubic rami fracture.  LLE Deficits / Details: Pain and difficulty initiating movement of LLE due to above mentioned acetabular fracture    Cervical / Trunk Assessment Cervical / Trunk Assessment: Kyphotic  Communication   Communication: HOH  Cognition Arousal/Alertness: Awake/alert Behavior During Therapy: WFL for tasks assessed/performed Overall Cognitive Status: Within Functional Limits for tasks assessed                                        General Comments      Exercises     Assessment/Plan    PT Assessment  Patient needs continued PT services  PT Problem List Decreased strength;Decreased range of motion;Decreased activity tolerance;Decreased balance;Decreased mobility;Decreased knowledge of use of DME;Decreased safety awareness;Decreased knowledge of precautions;Pain       PT Treatment Interventions DME instruction;Stair training;Gait training;Functional mobility training;Therapeutic activities;Therapeutic exercise;Neuromuscular re-education;Patient/family education    PT Goals (Current goals can be found in the Care Plan section)  Acute Rehab PT Goals Patient Stated Goal: Get stronger PT Goal Formulation: With patient/family Time For Goal Achievement: 06/23/17 Potential to Achieve Goals: Good    Frequency Min 3X/week   Barriers to discharge        Co-evaluation               AM-PAC PT "6 Clicks" Daily Activity  Outcome Measure Difficulty turning over in bed (including adjusting bedclothes, sheets and blankets)?: Unable Difficulty moving from lying on back to sitting on the side of the bed? : Unable Difficulty sitting down on and standing up from a chair with arms (e.g., wheelchair, bedside commode, etc,.)?: Unable Help needed moving to and from a bed to chair (including a wheelchair)?: Total Help needed walking in hospital room?: Total Help needed climbing 3-5 steps with a railing? : Total 6 Click Score: 6    End of Session Equipment Utilized During Treatment: Gait belt Activity Tolerance: Patient limited by pain Patient left: in chair;with call bell/phone within reach;with family/visitor present Nurse Communication: Mobility status PT Visit Diagnosis: Pain;Difficulty in walking, not elsewhere classified (R26.2) Pain - Right/Left: Left Pain - part of body: Hip    Time: 4920-1007 PT Time Calculation (min) (ACUTE ONLY): 18 min   Charges:   PT Evaluation $PT Eval Moderate Complexity: 1 Mod     PT G Codes:        Rolinda Roan, PT, DPT Acute Rehabilitation  Services Pager: [phone]   Thelma Comp 06/09/2017, 11:55 AM

## 2017-06-09 NOTE — Progress Notes (Signed)
PT fluids increased to 38ml/hr and morphine given to patient complaining of pain 8/10

## 2017-06-09 NOTE — Progress Notes (Signed)
[facility]                                                                              Patient Demographics  Gina Bowers, is a 84 y.o. female, DOB - [birth year], PIR:[id number]  Admit date - 06/08/2017   Admitting Physician Elwin Mocha, MD  Outpatient Primary MD for the patient is Lavone Orn, MD  Outpatient specialists:   LOS - 0  days   Medical records reviewed and are as summarized below:    Chief Complaint  Patient presents with  . Fall  . Hip Pain       Brief summary   Patient is a 84 year old female with osteoporosis, CAD status post CABG, hypothyroidism, hyperlipidemia stented from independent living facility with left hip pain.  Per patient, she woke up on the morning of admission to go to the bathroom, when she stood up her left leg went out and patient fell on her bathroom, hitting her head, left shoulder as well.  Patient reports that she was on the floor for 3 hours.  Later in the day her home health nurse found her on the floor and EMS was called. X-ray hip showed pubic rami fracture.  CT hip showed nondisplaced fracture of the left posterior stable M adjacent to prosthesis.   Assessment & Plan    Principal Problem:   Fall at home, initial encounter, pubic rami fracture, left stable arm fracture nondisplaced -Orthopedics consulted, seen by Dr. Durward Fortes recommended nonweightbearing left lower extremity for 6 weeks, follow-up after that for further management Patient will benefit from  skilled nursing facility rehab -PT OT evaluation recommended skilled nursing facility  -Pain not well controlled, 8-9/10 on movement, placed on IV morphine every 4 hours as needed for severe pain -CK normal -CT head and C-spine with no acute findings  Active Problems: Hyponatremia -Sodium 132, patient was on the floor for 3 hours, likely may have some underlying dehydration -Placed on IV fluids, 75 cc an hour, recheck BMET in a.m.     Hypothyroidism -TSH 6.5, check free T4, T3    Hyperlipidemia -Continue statin  Osteoporosis -Continue Fosamax weekly  History of recurrent UTIs -Follow UA and culture  History of depression/anxiety -Currently stable, no SI/HI, continue Zoloft  Hypertension -Continue Cozaar, Toprol  History of CAD -Continue Toprol, Cozaar, Imdur, statin, Plavix  Code Status: DNR DVT Prophylaxis:  SCD's Family Communication: Discussed in detail with the patient, all imaging results, lab results explained to the patient and daughter at the bedside   Disposition Plan:   Time Spent in minutes   35 minutes  Procedures:  None  Consultants:   Orthopedics  Antimicrobials:      Medications  Scheduled Meds: . aspirin EC  81 mg Oral QPM  . cephALEXin  250 mg Oral QPM  . clopidogrel  75 mg Oral Daily  . darifenacin  7.5 mg Oral Daily  . isosorbide mononitrate  15 mg Oral Daily  . levothyroxine  75 mcg Oral QAC breakfast  . losartan  25 mg Oral BID  . metoprolol succinate  25 mg Oral Daily  . pantoprazole  40 mg Oral  Daily  . rosuvastatin  20 mg Oral Daily  . sertraline  50 mg Oral Daily  . sodium chloride flush  3 mL Intravenous Q12H   Continuous Infusions: . sodium chloride 75 mL/hr at 06/09/17 1005   PRN Meds:.acetaminophen **OR** acetaminophen, hydrALAZINE, HYDROcodone-acetaminophen, ipratropium-albuterol, morphine injection, nitroGLYCERIN, ondansetron **OR** ondansetron (ZOFRAN) IV   Antibiotics   Anti-infectives (From admission, onward)   Start     Dose/Rate Route Frequency Ordered Stop   06/08/17 1930  cephALEXin (KEFLEX) capsule 250 mg     250 mg Oral Every evening 06/08/17 1915          Subjective:   Gina Bowers was seen and examined today.  Complaining of pain 8-9/10 in the left hip on slight movement. Patient denies dizziness, chest pain, shortness of breath, abdominal pain, nausea or vomiting.  Low-grade temp of 99.4 degrees.  Objective:   Vitals:    06/08/17 2100 06/08/17 2115 06/08/17 2315 06/09/17 0500  BP: (!) 112/48 97/60 (!) 115/32 (!) 126/46  Pulse: 66 64 60 65  Resp: (!) 22 19 18 15   Temp:   99.4 F (37.4 C) 98.2 F (36.8 C)  TempSrc:   Oral Oral  SpO2: 91% 94% 95% 92%  Weight:      Height:        Intake/Output Summary (Last 24 hours) at 06/09/2017 1214 Last data filed at 06/09/2017 0900 Gross per 24 hour  Intake -  Output 2200 ml  Net -2200 ml     Wt Readings from Last 3 Encounters:  06/08/17 78 kg (172 lb)  07/21/16 78.4 kg (172 lb 12.8 oz)  06/19/16 80.6 kg (177 lb 11.1 oz)     Exam  General: Alert and oriented x 3, NAD, uncomfortable  Eyes:   HEENT:  Atraumatic, normocephalic, dry mm  Cardiovascular: S1 S2 auscultated, no rubs, murmurs or gallops. Regular rate and rhythm.  Respiratory: Clear to auscultation bilaterally, no wheezing, rales or rhonchi  Gastrointestinal: Soft, nontender, nondistended, + bowel sounds  Ext: no pedal edema bilaterally  Neuro: difficult to assess with pain and tenderness on the left lower extremity  Musculoskeletal: No digital cyanosis, clubbing  Skin: No rashes  Psych: Normal affect and demeanor, alert and oriented x3    Data Reviewed:  I have personally reviewed following labs and imaging studies  Micro Results No results found for this or any previous visit (from the past 240 hour(s)).  Radiology Reports Dg Knee 1-2 Views Left  Result Date: 06/08/2017 CLINICAL DATA:  Pain of left fibula. Pt stated that she fell this morning around 7am, falling to her left and landing on her left side. She said her entire left knee jt hurts wether she flexes or extends her knee. And she has pain in her anterior and lateral left shoulder with a red colored bruise on the lateral side of her left shoulder. EXAM: LEFT KNEE - 1-2 VIEW COMPARISON:  None. FINDINGS: No fracture. Knee joint is normally spaced and aligned. No significant arthropathic change. No joint effusion. Bones are  demineralized.  No bone lesion. Mild lateral subcutaneous soft tissue edema is noted. IMPRESSION: No fracture or dislocation. Electronically Signed   By: Lajean Manes M.D.   On: 06/08/2017 20:53   Ct Head Wo Contrast  Result Date: 06/08/2017 CLINICAL DATA:  Pt states she fell and hit head on shower today. Pt denies LOC. Pt denies head/neck pain. EXAM: CT HEAD WITHOUT CONTRAST CT CERVICAL SPINE WITHOUT CONTRAST TECHNIQUE: Multidetector CT imaging of the head  and cervical spine was performed following the standard protocol without intravenous contrast. Multiplanar CT image reconstructions of the cervical spine were also generated. COMPARISON:  03/16/2017 FINDINGS: CT HEAD FINDINGS Brain: No evidence of acute infarction, hemorrhage, hydrocephalus, extra-axial collection or mass lesion/mass effect. There is ventricular and sulcal enlargement reflecting moderate diffuse atrophy. Patchy white matter hypoattenuation is noted consistent with moderate chronic microvascular ischemic change. Vascular: No hyperdense vessel or unexpected calcification. Skull: Normal. Negative for fracture or focal lesion. Sinuses/Orbits: Globes and orbits are unremarkable. Visualized sinuses and mastoid air cells are clear. Other: None. CT CERVICAL SPINE FINDINGS Alignment: Normal. Skull base and vertebrae: No acute fracture. No primary bone lesion or focal pathologic process. Soft tissues and spinal canal: No prevertebral fluid or swelling. No visible canal hematoma. Disc levels: Mild loss of disc height at C6-C7. There are endplate osteophytes from C4 through C7. Remaining disc spaces are well preserved. There are significant facet degenerative changes most evident on the right from C2-C3 through C6-C7. No central stenosis.  No convincing disc herniation. Upper chest: No acute finding. No masses or adenopathy. Minor scarring at the lung apices. Other: None. IMPRESSION: HEAD CT 1. No acute intracranial abnormalities. 2. Atrophy and chronic  microvascular ischemic change. CERVICAL CT 1. No fracture or acute finding. Electronically Signed   By: Lajean Manes M.D.   On: 06/08/2017 20:27   Ct Cervical Spine Wo Contrast  Result Date: 06/08/2017 CLINICAL DATA:  Pt states she fell and hit head on shower today. Pt denies LOC. Pt denies head/neck pain. EXAM: CT HEAD WITHOUT CONTRAST CT CERVICAL SPINE WITHOUT CONTRAST TECHNIQUE: Multidetector CT imaging of the head and cervical spine was performed following the standard protocol without intravenous contrast. Multiplanar CT image reconstructions of the cervical spine were also generated. COMPARISON:  03/16/2017 FINDINGS: CT HEAD FINDINGS Brain: No evidence of acute infarction, hemorrhage, hydrocephalus, extra-axial collection or mass lesion/mass effect. There is ventricular and sulcal enlargement reflecting moderate diffuse atrophy. Patchy white matter hypoattenuation is noted consistent with moderate chronic microvascular ischemic change. Vascular: No hyperdense vessel or unexpected calcification. Skull: Normal. Negative for fracture or focal lesion. Sinuses/Orbits: Globes and orbits are unremarkable. Visualized sinuses and mastoid air cells are clear. Other: None. CT CERVICAL SPINE FINDINGS Alignment: Normal. Skull base and vertebrae: No acute fracture. No primary bone lesion or focal pathologic process. Soft tissues and spinal canal: No prevertebral fluid or swelling. No visible canal hematoma. Disc levels: Mild loss of disc height at C6-C7. There are endplate osteophytes from C4 through C7. Remaining disc spaces are well preserved. There are significant facet degenerative changes most evident on the right from C2-C3 through C6-C7. No central stenosis.  No convincing disc herniation. Upper chest: No acute finding. No masses or adenopathy. Minor scarring at the lung apices. Other: None. IMPRESSION: HEAD CT 1. No acute intracranial abnormalities. 2. Atrophy and chronic microvascular ischemic change. CERVICAL  CT 1. No fracture or acute finding. Electronically Signed   By: Lajean Manes M.D.   On: 06/08/2017 20:27   Ct Hip Left Wo Contrast  Result Date: 06/08/2017 CLINICAL DATA:  Left hip pain status post fall today. EXAM: CT OF THE LEFT HIP WITHOUT CONTRAST TECHNIQUE: Multidetector CT imaging of the left hip was performed according to the standard protocol. Multiplanar CT image reconstructions were also generated. COMPARISON:  None. FINDINGS: Bones/Joint/Cartilage Left total hip arthroplasty without hardware failure or complication. No periarticular fluid collection or osteolysis. Nondisplaced fracture of the left posterior acetabulum adjacent to the acetabular cup component  of the prosthesis. Comminuted and displaced left inferior pubic ramus fracture. Normal alignment. No joint effusion. No aggressive osseous lesion. Ligaments Ligaments are suboptimally evaluated by CT. Muscles and Tendons Small intramuscular hematoma in the left obturator externus muscle. No other focal muscle signal abnormality. Soft tissue No fluid collection or hematoma.  No soft tissue mass. IMPRESSION: 1. Comminuted and displaced left inferior pubic ramus fracture. 2. Nondisplaced fracture of the left posterior acetabulum adjacent to the acetabular cup component of the prosthesis. Electronically Signed   By: Kathreen Devoid   On: 06/08/2017 15:37   Dg Shoulder Left  Result Date: 06/08/2017 CLINICAL DATA:  Pain of left fibula. Pt stated that she fell this morning around 7am, falling to her left and landing on her left side. She said her entire left knee jt hurts wether she flexes or extends her knee. And she has pain in her anterior and lateral left shoulder with a red colored bruise on the lateral side of her left shoulder. EXAM: LEFT SHOULDER - 2+ VIEW COMPARISON:  None. FINDINGS: No fracture or dislocation. Bones are demineralized. No bone lesion. There are no significant arthropathic changes. Soft tissues are unremarkable. IMPRESSION: No  fracture or dislocation. Electronically Signed   By: Lajean Manes M.D.   On: 06/08/2017 20:54   Dg Hip Unilat W Or Wo Pelvis 2-3 Views Left  Result Date: 06/08/2017 CLINICAL DATA:  Pt states she fell in her bathroom this a.m. because her left foot went to sleep and when she went to bear weight on her foot, she fell down onto her left hip. EXAM: DG HIP (WITH OR WITHOUT PELVIS) 2-3V LEFT COMPARISON:  CT abdomen and pelvis dated 12/11/2008. FINDINGS: Left hip arthroplasty hardware appears intact and appropriately positioned. No fracture line or displaced fracture fragment seen about the left acetabulum or within the proximal left femur. There is a displaced fracture deformity of the left inferior pubic ramus, of uncertain age, new compared to the CT of 12/11/2008. Remainder of the osseous pelvis appears intact and normally aligned. Degenerative changes noted at the right hip, at least moderate in degree with joint space narrowing and osseous spurring. IMPRESSION: 1. Displaced fracture deformity of the left inferior pubic ramus, of uncertain age, new compared to most recent comparison imaging which is a CT of 12/11/2008. Favor chronic but of uncertain age. 2. Left hip arthroplasty hardware appears intact and appropriately positioned. No fracture seen about the left acetabulum or within the proximal left femur. 3. Degenerative osteoarthritis at the right hip joint, moderate in degree. Electronically Signed   By: Franki Cabot M.D.   On: 06/08/2017 14:11    Lab Data:  CBC: Recent Labs  Lab 06/08/17 1911 06/09/17 0444  WBC 8.9 10.2  NEUTROABS 5.9  --   HGB 12.3 11.6*  HCT 36.8 35.1*  MCV 94.1 94.4  PLT 201 102   Basic Metabolic Panel: Recent Labs  Lab 06/08/17 1911 06/08/17 1916 06/09/17 0444  NA 133*  --  132*  K 3.6  --  4.1  CL 97*  --  99*  CO2 26  --  24  GLUCOSE 113*  --  111*  BUN 10  --  10  CREATININE 0.54  --  0.53  CALCIUM 9.0  --  9.0  MG  --  1.9  --   PHOS  --  3.4  --     GFR: Estimated Creatinine Clearance: 45.1 mL/min (by C-G formula based on SCr of 0.53 mg/dL). Liver Function  Tests: Recent Labs  Lab 06/08/17 1911  AST 26  ALT 21  ALKPHOS 57  BILITOT 0.7  PROT 5.9*  ALBUMIN 3.3*   No results for input(s): LIPASE, AMYLASE in the last 168 hours. No results for input(s): AMMONIA in the last 168 hours. Coagulation Profile: No results for input(s): INR, PROTIME in the last 168 hours. Cardiac Enzymes: Recent Labs  Lab 06/08/17 1911  CKTOTAL 87  TROPONINI <0.03   BNP (last 3 results) No results for input(s): PROBNP in the last 8760 hours. HbA1C: No results for input(s): HGBA1C in the last 72 hours. CBG: No results for input(s): GLUCAP in the last 168 hours. Lipid Profile: No results for input(s): CHOL, HDL, LDLCALC, TRIG, CHOLHDL, LDLDIRECT in the last 72 hours. Thyroid Function Tests: Recent Labs    06/08/17 1916  TSH 6.545*   Anemia Panel: No results for input(s): VITAMINB12, FOLATE, FERRITIN, TIBC, IRON, RETICCTPCT in the last 72 hours. Urine analysis:    Component Value Date/Time   COLORURINE YELLOW 03/16/2017 [place] 03/16/2017 1345   LABSPEC 1.010 03/16/2017 1345   PHURINE 7.0 03/16/2017 1345   GLUCOSEU NEGATIVE 03/16/2017 1345   HGBUR NEGATIVE 03/16/2017 1345   BILIRUBINUR NEGATIVE 03/16/2017 1345   KETONESUR NEGATIVE 03/16/2017 1345   PROTEINUR NEGATIVE 03/16/2017 1345   UROBILINOGEN 0.2 07/12/2012 1448   NITRITE NEGATIVE 03/16/2017 1345   LEUKOCYTESUR SMALL (A) 03/16/2017 1345     Ripudeep Rai M.D. [facility] 06/09/2017, 12:14 PM  Pager: [phone] Between 7am to 7pm - call Pager - [phone]  After 7pm go to [email or web address] - password [email or web address]  Call night coverage person covering after 7pm

## 2017-06-10 DIAGNOSIS — E7849 Other hyperlipidemia: Secondary | ICD-10-CM

## 2017-06-10 DIAGNOSIS — E039 Hypothyroidism, unspecified: Secondary | ICD-10-CM

## 2017-06-10 LAB — CBC
HCT: 33.5 % — ABNORMAL LOW (ref 36.0–46.0)
Hemoglobin: 11 g/dL — ABNORMAL LOW (ref 12.0–15.0)
MCH: 31.2 pg (ref 26.0–34.0)
MCHC: 32.8 g/dL (ref 30.0–36.0)
MCV: 94.9 fL (ref 78.0–100.0)
PLATELETS: 182 10*3/uL (ref 150–400)
RBC: 3.53 MIL/uL — AB (ref 3.87–5.11)
RDW: 13 % (ref 11.5–15.5)
WBC: 9.8 10*3/uL (ref 4.0–10.5)

## 2017-06-10 LAB — BASIC METABOLIC PANEL
ANION GAP: 8 (ref 5–15)
BUN: 7 mg/dL (ref 6–20)
CHLORIDE: 102 mmol/L (ref 101–111)
CO2: 24 mmol/L (ref 22–32)
Calcium: 8.2 mg/dL — ABNORMAL LOW (ref 8.9–10.3)
Creatinine, Ser: 0.42 mg/dL — ABNORMAL LOW (ref 0.44–1.00)
GFR calc Af Amer: >60 mL/min (ref >60)
GFR calc non Af Amer: >60 mL/min (ref >60)
GLUCOSE: 104 mg/dL — AB (ref 65–99)
POTASSIUM: 4 mmol/L (ref 3.5–5.1)
Sodium: 134 mmol/L — ABNORMAL LOW (ref 135–145)

## 2017-06-10 LAB — T3: T3, Total: 64 ng/dL — ABNORMAL LOW (ref 71–180)

## 2017-06-10 MED ORDER — POLYETHYLENE GLYCOL 3350 17 G PO PACK
17.0000 g | PACK | Freq: Once | ORAL | Status: AC
  Administered 2017-06-10: 17 g via ORAL
  Filled 2017-06-10: qty 1

## 2017-06-10 MED ORDER — HYDRALAZINE HCL 20 MG/ML IJ SOLN
10.0000 mg | Freq: Four times a day (QID) | INTRAMUSCULAR | Status: DC | PRN
  Administered 2017-06-12: 10 mg via INTRAVENOUS
  Filled 2017-06-10: qty 1

## 2017-06-10 MED ORDER — DEXTROSE 5 % IV SOLN
1.0000 g | INTRAVENOUS | Status: DC
  Administered 2017-06-10 – 2017-06-12 (×3): 1 g via INTRAVENOUS
  Filled 2017-06-10 (×3): qty 10

## 2017-06-10 MED ORDER — LEVOTHYROXINE SODIUM 88 MCG PO TABS
88.0000 ug | ORAL_TABLET | Freq: Every day | ORAL | Status: DC
  Administered 2017-06-11 – 2017-06-12 (×2): 88 ug via ORAL
  Filled 2017-06-10 (×2): qty 1

## 2017-06-10 MED ORDER — SENNOSIDES-DOCUSATE SODIUM 8.6-50 MG PO TABS
1.0000 | ORAL_TABLET | Freq: Two times a day (BID) | ORAL | Status: DC
  Administered 2017-06-10 – 2017-06-11 (×3): 1 via ORAL
  Filled 2017-06-10 (×5): qty 1

## 2017-06-10 NOTE — NC FL2 (Signed)
[place] LEVEL OF CARE SCREENING TOOL     IDENTIFICATION  Patient Name: Gina Bowers Birthdate: Jan 27, 1926 Sex: female Admission Date (Current Location): 06/08/2017  [facility] and [place] Number:  Herbalist and Address:  [facility], [place] [address], [place], [place] [address]      Provider Number: [id number]  Attending Physician Name and Address:  Mendel Corning, MD  Relative Name and Phone Number:  Denali Sharma, daughter, [phone]    Current Level of Care: Hospital Recommended Level of Care: [place] Prior Approval Number:    Date Approved/Denied:   PASRR Number: [id number]  Discharge Plan: SNF    Current Diagnoses: Patient Active Problem List   Diagnosis Date Noted  . Pubic ramus fracture ([place]) 06/08/2017  . Acetabulum fracture ([place]) 06/08/2017  . Fall at home, initial encounter 06/08/2017  . UTI (urinary tract infection) 06/10/2016  . Acute pyelonephritis   . SIRS (systemic inflammatory response syndrome) (HCC)   . Epistaxis 03/09/2016  . Ocular migraine 11/04/2015  . Essential hypertension 11/04/2015  . HLD (hyperlipidemia) 11/04/2015  . Coronary artery disease involving coronary bypass graft of native heart 11/04/2015  . Fatigue 10/08/2015  . Urinary tract infection 10/05/2015  . Chest pain 10/04/2015  . Hypertension 10/04/2015  . Unstable angina ([place])   . Hypertensive heart disease without heart failure   . S/P CABG 2009-cath 10/07/15-medical Rx   . S/P coronary artery stent placement-2012   . Acute CVA (cerebrovascular accident) ([place]) 08/17/2015  . Possible Renovascular hypertension 07/25/2015  . Hyperlipidemia 07/25/2015  . History of stroke-March 2017 (rt brain) 07/25/2015  . Vertebrobasilar artery stenosis 07/25/2015  . CAD (coronary artery disease) of artery bypass graft 06/06/2014  . Diastolic dysfunction-grade 1 with EF 60% 06/06/2014  . CVA (cerebral vascular accident) ([place])  11/17/2011  . TIA post cath 10/07/15 11/13/2011  . Weakness 11/13/2011  . Obstructive sleep apnea 11/23/2008  . PULMONARY NODULE 11/23/2008  . Hypoxemia 11/23/2008  . Hypothyroidism 11/22/2008  . Anxiety state 11/22/2008  . Cerebral artery occlusion with cerebral infarction ([place]) 11/22/2008    Orientation RESPIRATION BLADDER Height & Weight     Self, Time, Situation, Place  Normal Continent Weight: 172 lb (78 kg) Height:  5\' 1"  (154.9 cm)  BEHAVIORAL SYMPTOMS/MOOD NEUROLOGICAL BOWEL NUTRITION STATUS      Continent Diet(See DC Summary)  AMBULATORY STATUS COMMUNICATION OF NEEDS Skin   Extensive Assist Verbally Bruising(Pubic Rami Fracture)                       Personal Care Assistance Level of Assistance  Bathing, Feeding, Dressing Bathing Assistance: Limited assistance Feeding assistance: Independent Dressing Assistance: Limited assistance     Functional Limitations Info  Sight, Hearing, Speech Sight Info: Adequate Hearing Info: Adequate Speech Info: Adequate    SPECIAL CARE FACTORS FREQUENCY  PT (By licensed PT), OT (By licensed OT)     PT Frequency: 5x week OT Frequency: 5x week            Contractures      Additional Factors Info  Code Status, Allergies, Psychotropic Code Status Info: DNR Allergies Info: SULFA ANTIBIOTICS, SULFONAMIDE DERIVATIVES  Psychotropic Info: Zoloft         Current Medications (06/10/2017):  This is the current hospital active medication list Current Facility-Administered Medications  Medication Dose Route Frequency Provider Last Rate Last Dose  . 0.9 %  sodium chloride infusion   Intravenous Continuous Rai,  Ripudeep K, MD 75 mL/hr at 06/10/17 0322    . acetaminophen (TYLENOL) tablet 650 mg  650 mg Oral Q6H PRN Elwin Mocha, MD       Or  . acetaminophen (TYLENOL) suppository 650 mg  650 mg Rectal Q6H PRN Elwin Mocha, MD      . aspirin EC tablet 81 mg  81 mg Oral QPM Elwin Mocha, MD   81 mg at 06/09/17 1732  .  cefTRIAXone (ROCEPHIN) 1 g in dextrose 5 % 50 mL IVPB  1 g Intravenous Q24H Rai, Ripudeep K, MD 100 mL/hr at 06/10/17 0732 1 g at 06/10/17 0732  . clopidogrel (PLAVIX) tablet 75 mg  75 mg Oral Daily Elwin Mocha, MD   75 mg at 06/10/17 3810  . darifenacin (ENABLEX) 24 hr tablet 7.5 mg  7.5 mg Oral Daily Elwin Mocha, MD   7.5 mg at 06/10/17 1751  . hydrALAZINE (APRESOLINE) injection 10 mg  10 mg Intravenous Q8H PRN Elwin Mocha, MD      . HYDROcodone-acetaminophen (NORCO/VICODIN) 5-325 MG per tablet 1-2 tablet  1-2 tablet Oral Q4H PRN Elwin Mocha, MD   1 tablet at 06/09/17 0501  . ipratropium-albuterol (DUONEB) 0.5-2.5 (3) MG/3ML nebulizer solution 3 mL  3 mL Nebulization Q4H PRN Elwin Mocha, MD      . isosorbide mononitrate (IMDUR) 24 hr tablet 15 mg  15 mg Oral Daily Elwin Mocha, MD   15 mg at 06/10/17 0834  . [START ON 06/11/2017] levothyroxine (SYNTHROID, LEVOTHROID) tablet 88 mcg  88 mcg Oral QAC breakfast Rai, Ripudeep K, MD      . losartan (COZAAR) tablet 25 mg  25 mg Oral BID Elwin Mocha, MD   25 mg at 06/10/17 0835  . metoprolol succinate (TOPROL-XL) 24 hr tablet 25 mg  25 mg Oral Daily Elwin Mocha, MD   25 mg at 06/10/17 0834  . morphine 4 MG/ML injection 1 mg  1 mg Intravenous Q4H PRN Rai, Ripudeep K, MD   1 mg at 06/10/17 0833  . nitroGLYCERIN (NITROSTAT) SL tablet 0.4 mg  0.4 mg Sublingual Q5 min PRN Elwin Mocha, MD      . ondansetron [facility]) tablet 4 mg  4 mg Oral Q6H PRN Elwin Mocha, MD       Or  . ondansetron [facility]) injection 4 mg  4 mg Intravenous Q6H PRN Elwin Mocha, MD      . pantoprazole (PROTONIX) EC tablet 40 mg  40 mg Oral Daily Elwin Mocha, MD   40 mg at 06/10/17 0835  . polyethylene glycol (MIRALAX / GLYCOLAX) packet 17 g  17 g Oral Once Rai, Ripudeep K, MD      . rosuvastatin (CRESTOR) tablet 20 mg  20 mg Oral Daily Elwin Mocha, MD   20 mg at 06/10/17 0834  . senna-docusate (Senokot-S) tablet 1 tablet  1  tablet Oral BID Rai, Ripudeep K, MD      . sertraline (ZOLOFT) tablet 50 mg  50 mg Oral Daily Elwin Mocha, MD   50 mg at 06/10/17 0835  . sodium chloride flush (NS) 0.9 % injection 3 mL  3 mL Intravenous Q12H Elwin Mocha, MD   3 mL at 06/09/17 0037     Discharge Medications: Please see discharge summary for a list of discharge medications.  Relevant Imaging Results:  Relevant Lab Results:   Additional Information SS#: [place]  V Manjot Hinks, LCSW

## 2017-06-10 NOTE — Progress Notes (Signed)
Subjective:    Patient reports pain as mild.    Objective: Vital signs in last 24 hours: Temp:  [99.1 F (37.3 C)-99.7 F (37.6 C)] 99.1 F (37.3 C) (01/17 0500) Pulse Rate:  [62-65] 62 (01/17 0500) Resp:  [17] 17 (01/17 0500) BP: (180-181)/(64-73) 180/64 (01/17 0500) SpO2:  [92 %-93 %] 93 % (01/17 0500)  Intake/Output from previous day: 01/16 0701 - 01/17 0700 In: 368.8 [I.V.:368.8] Out: 2600 [Urine:2600] Intake/Output this shift: No intake/output data recorded.  Recent Labs    06/08/17 1911 06/09/17 0444 06/10/17 0818  HGB 12.3 11.6* 11.0*   Recent Labs    06/09/17 0444 06/10/17 0818  WBC 10.2 9.8  RBC 3.72* 3.53*  HCT 35.1* 33.5*  PLT 198 182   Recent Labs    06/08/17 1911 06/09/17 0444  NA 133* 132*  K 3.6 4.1  CL 97* 99*  CO2 26 24  BUN 10 10  CREATININE 0.54 0.53  GLUCOSE 113* 111*  CALCIUM 9.0 9.0   No results for input(s): LABPT, INR in the last 72 hours.  Neurologically intact Compartment soft  Assessment/Plan:    Discharge to SNF Pain in c-spine, left shoulder and left knee better. Reviewed films of all of the above with Mrs Delafuente. Comfortable at rest.Plan is to maintain bed to chair for at least 4-6 weeks to allow pelvic fractures to heal. Would like to see in office in 2-3 weeks to monitor films. Garald Balding 06/10/2017, 9:11 AM

## 2017-06-10 NOTE — Clinical Social Work Note (Addendum)
Clinical Social Work Assessment  Patient Details  Name: Gina Bowers MRN: [id number] Date of Birth: May 06, 1926  Date of referral:  06/10/17               Reason for consult:  Facility Placement                Permission sought to share information with:  Chartered certified accountant granted to share information::  Yes, Verbal Permission Granted  Name::     Orthoptist::  SNF-  Relationship::  daughter  Contact Information:     Housing/Transportation Living arrangements for the past 2 months:  [place] of Information:  Patient, Other (Comment Required)(Friend at bedside) Patient Interpreter Needed:  None Criminal Activity/Legal Involvement Pertinent to Current Situation/Hospitalization:  No - Comment as needed Significant Relationships:  Friend, Adult Children, Other Family Members Lives with:  Self, Other (Comment)(Independent Living at [facility]) Do you feel safe going back to the place where you live?  No Need for family participation in patient care:  Yes (Comment)  Care giving concerns:  Pt resides in independent living at [facility]. Pt has new impairment that will require short term rehab. Patient and familycaregiver in agreement with short term rehab needs. CSW explained SNF process and placement with patient at friend at bedside. They both are interested and voiced understanding of all CSW discussed. They have interest in the following SNF's-[facility], [facility], [facility] and have given permission to send to [employer]  Social Worker assessment / plan:  CSW will f/u with family on SNF offers once received and assist with disposition.  Employment status:  Retired Forensic scientist:  Medicare PT Recommendations:  [place] / Referral to community resources:  [place]  Patient/Family's Response to care:  Patient/friend appreciative of CSW coming to office  to meet and discuss the disposition. CSW answered all questions and patient appeared appreciative of CSW assistance.   Patient/Family's Understanding of and Emotional Response to Diagnosis, Current Treatment, and Prognosis:  Patient and friend reported good understanding of patient's impairment as they are in agreement with SNF and have already obtained a list of the SNF's that they are interested in. Pt acknowledges that with new impairment she is unable to return to independence yet and hopes to improve soon and get back home. Pt reports no issues and acknowledged SNF need. No other issues or concerns identified.  Emotional Assessment Appearance:  Appears stated age Attitude/Demeanor/Rapport:  (Cooperative and Engaging) Affect (typically observed):  Accepting, Appropriate Orientation:  Oriented to Self, Oriented to Place, Oriented to  Time, Oriented to Situation Alcohol / Substance use:  Not Applicable Psych involvement (Current and /or in the community):  No (Comment)  Discharge Needs  Concerns to be addressed:  Discharge Planning Concerns Readmission within the last 30 days:  No Current discharge risk:  Dependent with Mobility, Physical Impairment Barriers to Discharge:  No Barriers Identified   Normajean Baxter, LCSW 06/10/2017, 10:57 AM

## 2017-06-10 NOTE — Social Work (Addendum)
CSW contacted daughter and discussed SNF offers. She indicated that she would review and let CSW know which SNF they select.  CSW will leave SNF list with offers in the room with patient and caregiver to discuss with daughter.  CSW will f/u.  Elissa Hefty, LCSW Clinical Social Worker [phone]

## 2017-06-10 NOTE — Progress Notes (Signed)
[facility]                                                                              Patient Demographics  Gina Bowers, is a 84 y.o. female, DOB - 10-10-1925, [id number]  Admit date - 06/08/2017   Admitting Physician Elwin Mocha, MD  Outpatient Primary MD for the patient is Lavone Orn, MD  Outpatient specialists:   LOS - 1  days   Medical records reviewed and are as summarized below:    Chief Complaint  Patient presents with  . Fall  . Hip Pain       Brief summary   Patient is a 84 year old female with osteoporosis, CAD status post CABG, hypothyroidism, hyperlipidemia stented from independent living facility with left hip pain.  Per patient, she woke up on the morning of admission to go to the bathroom, when she stood up her left leg went out and patient fell on her bathroom, hitting her head, left shoulder as well.  Patient reports that she was on the floor for 3 hours.  Later in the day her home health nurse found her on the floor and EMS was called. X-ray hip showed pubic rami fracture.  CT hip showed nondisplaced fracture of the left posterior stable M adjacent to prosthesis.   Assessment & Plan    Principal Problem:   Fall at home, initial encounter, pubic rami fracture, left stable arm fracture nondisplaced -Orthopedics consulted, seen by Dr. Durward Fortes recommended nonweightbearing left lower extremity for 6 weeks, follow-up after that for further management Patient will benefit from  skilled nursing facility rehab -PT OT evaluation recommended skilled nursing facility  -Pain better controlled with meds, placed on bowel regimen -CK normal -CT head and C-spine with no acute findings  Active Problems:  Urinary tract infection with history of recurrent UTIs, enterococcus -Unfortunately urine cultures still not sent, UA and culture were ordered together (discussed with RN) -Based on UA results, patient was placed on IV  rocephin  Hyponatremia -Sodium improving, continue gentle hydration  -Still negative balance of 2.19 L, encourage patient for p.o. diet and water    Hypothyroidism -TSH 6.5, T3 low 64, free T4 0.9. -Increase Synthroid to 88 MCG daily    Hyperlipidemia -Continue statin  Osteoporosis -Continue Fosamax weekly  History of depression/anxiety -Currently stable, no SI/HI, continue Zoloft  Hypertension -Continue Cozaar, Toprol.  BP readings elevated, add hydralazine as needed  History of CAD -Continue Toprol, Cozaar, Imdur, statin, Plavix  Code Status: DNR DVT Prophylaxis:  SCD's Family Communication: Discussed in detail with the patient, all imaging results, lab results explained to the patient    Disposition Plan:   Time Spent in minutes   35 minutes  Procedures:  None  Consultants:   Orthopedics  Antimicrobials:   IV Rocephin 1/17>   Medications  Scheduled Meds: . aspirin EC  81 mg Oral QPM  . clopidogrel  75 mg Oral Daily  . darifenacin  7.5 mg Oral Daily  . isosorbide mononitrate  15 mg Oral Daily  . [START ON 06/11/2017] levothyroxine  88 mcg Oral QAC breakfast  . losartan  25 mg Oral  BID  . metoprolol succinate  25 mg Oral Daily  . pantoprazole  40 mg Oral Daily  . rosuvastatin  20 mg Oral Daily  . senna-docusate  1 tablet Oral BID  . sertraline  50 mg Oral Daily  . sodium chloride flush  3 mL Intravenous Q12H   Continuous Infusions: . sodium chloride 75 mL/hr at 06/10/17 0322  . cefTRIAXone (ROCEPHIN)  IV 1 g (06/10/17 0732)   PRN Meds:.acetaminophen **OR** acetaminophen, hydrALAZINE, HYDROcodone-acetaminophen, ipratropium-albuterol, morphine injection, nitroGLYCERIN, ondansetron **OR** ondansetron (ZOFRAN) IV   Antibiotics   Anti-infectives (From admission, onward)   Start     Dose/Rate Route Frequency Ordered Stop   06/10/17 0630  cefTRIAXone (ROCEPHIN) 1 g in dextrose 5 % 50 mL IVPB     1 g 100 mL/hr over 30 Minutes Intravenous Every 24  hours 06/10/17 0616     06/08/17 1930  cephALEXin (KEFLEX) capsule 250 mg  Status:  Discontinued     250 mg Oral Every evening 06/08/17 1915 06/10/17 0615        Subjective:   Gina Bowers was seen and examined today.  Feeling somewhat better today, feels constipated.  Left hip pain 8/10 on movement.  Low-grade temp of 99.7 F.  Patient denies dizziness, chest pain, shortness of breath, abdominal pain, nausea or vomiting.   Objective:   Vitals:   06/08/17 2315 06/09/17 0500 06/09/17 1936 06/10/17 0500  BP: (!) 115/32 (!) 126/46 (!) 181/73 (!) 180/64  Pulse: 60 65 65 62  Resp: 18 15 17 17   Temp: 99.4 F (37.4 C) 98.2 F (36.8 C) 99.7 F (37.6 C) 99.1 F (37.3 C)  TempSrc: Oral Oral Oral Oral  SpO2: 95% 92% 92% 93%  Weight:      Height:        Intake/Output Summary (Last 24 hours) at 06/10/2017 1422 Last data filed at 06/10/2017 0900 Gross per 24 hour  Intake 608.75 ml  Output 600 ml  Net 8.75 ml     Wt Readings from Last 3 Encounters:  06/08/17 78 kg (172 lb)  07/21/16 78.4 kg (172 lb 12.8 oz)  06/19/16 80.6 kg (177 lb 11.1 oz)     Exam   General: Alert and oriented x 3, NAD  Eyes:   HEENT:    Cardiovascular: S1 S2 clear, RRR  No pedal edema b/l  Respiratory: Clear to auscultation bilaterally, no wheezing, rales or rhonchi  Gastrointestinal: Soft, nontender, nondistended, + bowel sounds  Ext: no pedal edema bilaterally  Neuro: no new deficits  Musculoskeletal: No digital cyanosis, clubbing  Skin: No rashes  Psych: Normal affect and demeanor, alert and oriented x3     Data Reviewed:  I have personally reviewed following labs and imaging studies  Micro Results No results found for this or any previous visit (from the past 240 hour(s)).  Radiology Reports Dg Knee 1-2 Views Left  Result Date: 06/08/2017 CLINICAL DATA:  Pain of left fibula. Pt stated that she fell this morning around 7am, falling to her left and landing on her left side. She  said her entire left knee jt hurts wether she flexes or extends her knee. And she has pain in her anterior and lateral left shoulder with a red colored bruise on the lateral side of her left shoulder. EXAM: LEFT KNEE - 1-2 VIEW COMPARISON:  None. FINDINGS: No fracture. Knee joint is normally spaced and aligned. No significant arthropathic change. No joint effusion. Bones are demineralized.  No bone lesion. Mild lateral subcutaneous  soft tissue edema is noted. IMPRESSION: No fracture or dislocation. Electronically Signed   By: Lajean Manes M.D.   On: 06/08/2017 20:53   Ct Head Wo Contrast  Result Date: 06/08/2017 CLINICAL DATA:  Pt states she fell and hit head on shower today. Pt denies LOC. Pt denies head/neck pain. EXAM: CT HEAD WITHOUT CONTRAST CT CERVICAL SPINE WITHOUT CONTRAST TECHNIQUE: Multidetector CT imaging of the head and cervical spine was performed following the standard protocol without intravenous contrast. Multiplanar CT image reconstructions of the cervical spine were also generated. COMPARISON:  03/16/2017 FINDINGS: CT HEAD FINDINGS Brain: No evidence of acute infarction, hemorrhage, hydrocephalus, extra-axial collection or mass lesion/mass effect. There is ventricular and sulcal enlargement reflecting moderate diffuse atrophy. Patchy white matter hypoattenuation is noted consistent with moderate chronic microvascular ischemic change. Vascular: No hyperdense vessel or unexpected calcification. Skull: Normal. Negative for fracture or focal lesion. Sinuses/Orbits: Globes and orbits are unremarkable. Visualized sinuses and mastoid air cells are clear. Other: None. CT CERVICAL SPINE FINDINGS Alignment: Normal. Skull base and vertebrae: No acute fracture. No primary bone lesion or focal pathologic process. Soft tissues and spinal canal: No prevertebral fluid or swelling. No visible canal hematoma. Disc levels: Mild loss of disc height at C6-C7. There are endplate osteophytes from C4 through C7.  Remaining disc spaces are well preserved. There are significant facet degenerative changes most evident on the right from C2-C3 through C6-C7. No central stenosis.  No convincing disc herniation. Upper chest: No acute finding. No masses or adenopathy. Minor scarring at the lung apices. Other: None. IMPRESSION: HEAD CT 1. No acute intracranial abnormalities. 2. Atrophy and chronic microvascular ischemic change. CERVICAL CT 1. No fracture or acute finding. Electronically Signed   By: Lajean Manes M.D.   On: 06/08/2017 20:27   Ct Cervical Spine Wo Contrast  Result Date: 06/08/2017 CLINICAL DATA:  Pt states she fell and hit head on shower today. Pt denies LOC. Pt denies head/neck pain. EXAM: CT HEAD WITHOUT CONTRAST CT CERVICAL SPINE WITHOUT CONTRAST TECHNIQUE: Multidetector CT imaging of the head and cervical spine was performed following the standard protocol without intravenous contrast. Multiplanar CT image reconstructions of the cervical spine were also generated. COMPARISON:  03/16/2017 FINDINGS: CT HEAD FINDINGS Brain: No evidence of acute infarction, hemorrhage, hydrocephalus, extra-axial collection or mass lesion/mass effect. There is ventricular and sulcal enlargement reflecting moderate diffuse atrophy. Patchy white matter hypoattenuation is noted consistent with moderate chronic microvascular ischemic change. Vascular: No hyperdense vessel or unexpected calcification. Skull: Normal. Negative for fracture or focal lesion. Sinuses/Orbits: Globes and orbits are unremarkable. Visualized sinuses and mastoid air cells are clear. Other: None. CT CERVICAL SPINE FINDINGS Alignment: Normal. Skull base and vertebrae: No acute fracture. No primary bone lesion or focal pathologic process. Soft tissues and spinal canal: No prevertebral fluid or swelling. No visible canal hematoma. Disc levels: Mild loss of disc height at C6-C7. There are endplate osteophytes from C4 through C7. Remaining disc spaces are well  preserved. There are significant facet degenerative changes most evident on the right from C2-C3 through C6-C7. No central stenosis.  No convincing disc herniation. Upper chest: No acute finding. No masses or adenopathy. Minor scarring at the lung apices. Other: None. IMPRESSION: HEAD CT 1. No acute intracranial abnormalities. 2. Atrophy and chronic microvascular ischemic change. CERVICAL CT 1. No fracture or acute finding. Electronically Signed   By: Lajean Manes M.D.   On: 06/08/2017 20:27   Ct Hip Left Wo Contrast  Result Date: 06/08/2017 CLINICAL DATA:  Left hip pain status post fall today. EXAM: CT OF THE LEFT HIP WITHOUT CONTRAST TECHNIQUE: Multidetector CT imaging of the left hip was performed according to the standard protocol. Multiplanar CT image reconstructions were also generated. COMPARISON:  None. FINDINGS: Bones/Joint/Cartilage Left total hip arthroplasty without hardware failure or complication. No periarticular fluid collection or osteolysis. Nondisplaced fracture of the left posterior acetabulum adjacent to the acetabular cup component of the prosthesis. Comminuted and displaced left inferior pubic ramus fracture. Normal alignment. No joint effusion. No aggressive osseous lesion. Ligaments Ligaments are suboptimally evaluated by CT. Muscles and Tendons Small intramuscular hematoma in the left obturator externus muscle. No other focal muscle signal abnormality. Soft tissue No fluid collection or hematoma.  No soft tissue mass. IMPRESSION: 1. Comminuted and displaced left inferior pubic ramus fracture. 2. Nondisplaced fracture of the left posterior acetabulum adjacent to the acetabular cup component of the prosthesis. Electronically Signed   By: Kathreen Devoid   On: 06/08/2017 15:37   Dg Shoulder Left  Result Date: 06/08/2017 CLINICAL DATA:  Pain of left fibula. Pt stated that she fell this morning around 7am, falling to her left and landing on her left side. She said her entire left knee jt  hurts wether she flexes or extends her knee. And she has pain in her anterior and lateral left shoulder with a red colored bruise on the lateral side of her left shoulder. EXAM: LEFT SHOULDER - 2+ VIEW COMPARISON:  None. FINDINGS: No fracture or dislocation. Bones are demineralized. No bone lesion. There are no significant arthropathic changes. Soft tissues are unremarkable. IMPRESSION: No fracture or dislocation. Electronically Signed   By: Lajean Manes M.D.   On: 06/08/2017 20:54   Dg Hip Unilat W Or Wo Pelvis 2-3 Views Left  Result Date: 06/08/2017 CLINICAL DATA:  Pt states she fell in her bathroom this a.m. because her left foot went to sleep and when she went to bear weight on her foot, she fell down onto her left hip. EXAM: DG HIP (WITH OR WITHOUT PELVIS) 2-3V LEFT COMPARISON:  CT abdomen and pelvis dated 12/11/2008. FINDINGS: Left hip arthroplasty hardware appears intact and appropriately positioned. No fracture line or displaced fracture fragment seen about the left acetabulum or within the proximal left femur. There is a displaced fracture deformity of the left inferior pubic ramus, of uncertain age, new compared to the CT of 12/11/2008. Remainder of the osseous pelvis appears intact and normally aligned. Degenerative changes noted at the right hip, at least moderate in degree with joint space narrowing and osseous spurring. IMPRESSION: 1. Displaced fracture deformity of the left inferior pubic ramus, of uncertain age, new compared to most recent comparison imaging which is a CT of 12/11/2008. Favor chronic but of uncertain age. 2. Left hip arthroplasty hardware appears intact and appropriately positioned. No fracture seen about the left acetabulum or within the proximal left femur. 3. Degenerative osteoarthritis at the right hip joint, moderate in degree. Electronically Signed   By: Franki Cabot M.D.   On: 06/08/2017 14:11    Lab Data:  CBC: Recent Labs  Lab 06/08/17 1911 06/09/17 0444  06/10/17 0818  WBC 8.9 10.2 9.8  NEUTROABS 5.9  --   --   HGB 12.3 11.6* 11.0*  HCT 36.8 35.1* 33.5*  MCV 94.1 94.4 94.9  PLT 201 198 301   Basic Metabolic Panel: Recent Labs  Lab 06/08/17 1911 06/08/17 1916 06/09/17 0444 06/10/17 0818  NA 133*  --  132* 134*  K 3.6  --  4.1 4.0  CL 97*  --  99* 102  CO2 26  --  24 24  GLUCOSE 113*  --  111* 104*  BUN 10  --  10 7  CREATININE 0.54  --  0.53 0.42*  CALCIUM 9.0  --  9.0 8.2*  MG  --  1.9  --   --   PHOS  --  3.4  --   --    GFR: Estimated Creatinine Clearance: 45.1 mL/min (A) (by C-G formula based on SCr of 0.42 mg/dL (L)). Liver Function Tests: Recent Labs  Lab 06/08/17 1911  AST 26  ALT 21  ALKPHOS 57  BILITOT 0.7  PROT 5.9*  ALBUMIN 3.3*   No results for input(s): LIPASE, AMYLASE in the last 168 hours. No results for input(s): AMMONIA in the last 168 hours. Coagulation Profile: No results for input(s): INR, PROTIME in the last 168 hours. Cardiac Enzymes: Recent Labs  Lab 06/08/17 1911  CKTOTAL 87  TROPONINI <0.03   BNP (last 3 results) No results for input(s): PROBNP in the last 8760 hours. HbA1C: No results for input(s): HGBA1C in the last 72 hours. CBG: No results for input(s): GLUCAP in the last 168 hours. Lipid Profile: No results for input(s): CHOL, HDL, LDLCALC, TRIG, CHOLHDL, LDLDIRECT in the last 72 hours. Thyroid Function Tests: Recent Labs    06/08/17 1916 06/09/17 1100  TSH 6.545*  --   FREET4  --  0.94   Anemia Panel: No results for input(s): VITAMINB12, FOLATE, FERRITIN, TIBC, IRON, RETICCTPCT in the last 72 hours. Urine analysis:    Component Value Date/Time   COLORURINE YELLOW 06/09/2017 1108   APPEARANCEUR HAZY (A) 06/09/2017 1108   LABSPEC 1.018 06/09/2017 1108   PHURINE 5.0 06/09/2017 1108   GLUCOSEU NEGATIVE 06/09/2017 1108   HGBUR NEGATIVE 06/09/2017 1108   [place] 06/09/2017 1108   [place] 06/09/2017 1108   PROTEINUR NEGATIVE 06/09/2017 1108    UROBILINOGEN 0.2 07/12/2012 1448   NITRITE POSITIVE (A) 06/09/2017 1108   LEUKOCYTESUR LARGE (A) 06/09/2017 1108     Laren Orama M.D. [facility] 06/10/2017, 2:22 PM  Pager: [phone] Between 7am to 7pm - call Pager - [phone]  After 7pm go to [email or web address] - password [email or web address]  Call night coverage person covering after 7pm

## 2017-06-10 NOTE — Social Work (Addendum)
CSW met with patient, daughter and granddaughter at bedside to discuss SNF offers. Daughter accepted bed offer from [facility]. CSW contacted admission staff at [facility] and left message.   CSW will f/u.  Elissa Hefty, LCSW Clinical Social Worker [phone]

## 2017-06-11 LAB — CBC
HCT: 33.2 % — ABNORMAL LOW (ref 36.0–46.0)
Hemoglobin: 11 g/dL — ABNORMAL LOW (ref 12.0–15.0)
MCH: 31.3 pg (ref 26.0–34.0)
MCHC: 33.1 g/dL (ref 30.0–36.0)
MCV: 94.3 fL (ref 78.0–100.0)
PLATELETS: 175 10*3/uL (ref 150–400)
RBC: 3.52 MIL/uL — AB (ref 3.87–5.11)
RDW: 12.9 % (ref 11.5–15.5)
WBC: 9 10*3/uL (ref 4.0–10.5)

## 2017-06-11 LAB — BASIC METABOLIC PANEL
Anion gap: 11 (ref 5–15)
BUN: 5 mg/dL — AB (ref 6–20)
CALCIUM: 8.3 mg/dL — AB (ref 8.9–10.3)
CO2: 23 mmol/L (ref 22–32)
CREATININE: 0.38 mg/dL — AB (ref 0.44–1.00)
Chloride: 101 mmol/L (ref 101–111)
GFR calc Af Amer: >60 mL/min (ref >60)
Glucose, Bld: 101 mg/dL — ABNORMAL HIGH (ref 65–99)
Potassium: 3.8 mmol/L (ref 3.5–5.1)
SODIUM: 135 mmol/L (ref 135–145)

## 2017-06-11 MED ORDER — MAGNESIUM CITRATE PO SOLN
1.0000 | Freq: Once | ORAL | Status: AC
  Administered 2017-06-11: 1 via ORAL
  Filled 2017-06-11: qty 296

## 2017-06-11 NOTE — Plan of Care (Signed)
  Progressing Education: Knowledge of General Education information will improve 06/11/2017 1542 - Progressing by Rance Muir, RN Health Behavior/Discharge Planning: Ability to manage health-related needs will improve 06/11/2017 1542 - Progressing by Rance Muir, RN Clinical Measurements: Ability to maintain clinical measurements within normal limits will improve 06/11/2017 1542 - Progressing by Rance Muir, RN Will remain free from infection 06/11/2017 1542 - Progressing by Rance Muir, RN Diagnostic test results will improve 06/11/2017 1542 - Progressing by Rance Muir, RN Respiratory complications will improve 06/11/2017 1542 - Progressing by Rance Muir, RN Cardiovascular complication will be avoided 06/11/2017 1542 - Progressing by Rance Muir, RN Activity: Risk for activity intolerance will decrease 06/11/2017 1542 - Progressing by Rance Muir, RN Nutrition: Adequate nutrition will be maintained 06/11/2017 1542 - Progressing by Rance Muir, RN Coping: Level of anxiety will decrease 06/11/2017 1542 - Progressing by Rance Muir, RN Elimination: Will not experience complications related to bowel motility 06/11/2017 1542 - Progressing by Rance Muir, RN Will not experience complications related to urinary retention 06/11/2017 1542 - Progressing by Rance Muir, RN Pain Managment: General experience of comfort will improve 06/11/2017 1542 - Progressing by Rance Muir, RN Safety: Ability to remain free from injury will improve 06/11/2017 1542 - Progressing by Rance Muir, RN Skin Integrity: Risk for impaired skin integrity will decrease 06/11/2017 1542 - Progressing by Rance Muir, RN

## 2017-06-11 NOTE — Progress Notes (Signed)
[facility]                                                                              Patient Demographics  Gina Bowers, is a 84 y.o. female, DOB - September 03, 1925, RWE:[id number]  Admit date - 06/08/2017   Admitting Physician Gina Mocha, MD  Outpatient Primary MD for the patient is Gina Orn, MD  Outpatient specialists:   LOS - 2  days   Medical records reviewed and are as summarized below:    Chief Complaint  Patient presents with  . Fall  . Hip Pain       Brief summary   Patient is a 84 year old female with osteoporosis, CAD status post CABG, hypothyroidism, hyperlipidemia stented from independent living facility with left hip pain.  Per patient, she woke up on the morning of admission to go to the bathroom, when she stood up her left leg went out and patient fell on her bathroom, hitting her head, left shoulder as well.  Patient reports that she was on the floor for 3 hours.  Later in the day her home health nurse found her on the floor and EMS was called. X-ray hip showed pubic rami fracture.  CT hip showed nondisplaced fracture of the left posterior stable M adjacent to prosthesis.   Assessment & Plan    Principal Problem:   Fall at home, initial encounter, pubic rami fracture, left stable arm fracture nondisplaced -Orthopedics consulted, seen by Gina Bowers recommended nonweightbearing left lower extremity for 6 weeks, follow-up after that for further management -Pain better controlled with meds, placed on bowel regimen -CK normal -CT head and C-spine with no acute findings -Per PT evaluation, need skilled nursing facility for rehab  Active Problems:  Urinary tract infection with history of recurrent UTIs, enterococcus -Follow urine culture and sensitivities, for now continue IV Rocephin  Hyponatremia -Sodium improved, DC IV fluids, encouraged oral diet    Hypothyroidism -TSH 6.5, T3 low 64, free T4 0.9. -Increase Synthroid to 88  MCG daily    Hyperlipidemia -Continue statin  Osteoporosis -Continue Fosamax weekly  History of depression/anxiety -Currently stable, no SI/HI, continue Zoloft  Hypertension -Continue Cozaar, Toprol.  BP readings elevated, add hydralazine as needed  History of CAD -Continue Toprol, Cozaar, Imdur, statin, Plavix  Constipation -No BM with MiraLAX or Senokot, add mag citrate  Code Status: DNR DVT Prophylaxis:  SCD's Family Communication: Discussed in detail with the patient, all imaging results, lab results explained to the patient    Disposition Plan: Possible DC in a.m. to skilled nursing facility  Time Spent in minutes   25 minutes  Procedures:  None  Consultants:   Orthopedics  Antimicrobials:   IV Rocephin 1/17>   Medications  Scheduled Meds: . aspirin EC  81 mg Oral QPM  . clopidogrel  75 mg Oral Daily  . darifenacin  7.5 mg Oral Daily  . isosorbide mononitrate  15 mg Oral Daily  . levothyroxine  88 mcg Oral QAC breakfast  . losartan  25 mg Oral BID  . metoprolol succinate  25 mg Oral Daily  . pantoprazole  40 mg Oral Daily  .  rosuvastatin  20 mg Oral Daily  . senna-docusate  1 tablet Oral BID  . sertraline  50 mg Oral Daily  . sodium chloride flush  3 mL Intravenous Q12H   Continuous Infusions: . cefTRIAXone (ROCEPHIN)  IV Stopped (06/11/17 0609)   PRN Meds:.acetaminophen **OR** acetaminophen, hydrALAZINE, HYDROcodone-acetaminophen, ipratropium-albuterol, morphine injection, nitroGLYCERIN, ondansetron **OR** ondansetron (ZOFRAN) IV   Antibiotics   Anti-infectives (From admission, onward)   Start     Dose/Rate Route Frequency Ordered Stop   06/10/17 0630  cefTRIAXone (ROCEPHIN) 1 g in dextrose 5 % 50 mL IVPB     1 g 100 mL/hr over 30 Minutes Intravenous Every 24 hours 06/10/17 0616     06/08/17 1930  cephALEXin (KEFLEX) capsule 250 mg  Status:  Discontinued     250 mg Oral Every evening 06/08/17 1915 06/10/17 0615        Subjective:    Gina Bowers was seen and examined today.  Not feeling too good today, exhausted.  Overnight spiked a temp of 100.5 F.  Still somewhat constipated.  Left hip pain improving.  Patient denies dizziness, chest pain, shortness of breath, abdominal pain, nausea or vomiting.   Objective:   Vitals:   06/10/17 1500 06/10/17 2227 06/11/17 0003 06/11/17 0601  BP: (!) 155/60 (!) 174/63  (!) 175/65  Pulse: 65 67  (!) 58  Resp: 17 18  17   Temp:  (!) 100.5 F (38.1 C) 99.5 F (37.5 C) 98 F (36.7 C)  TempSrc:  Oral Oral Oral  SpO2: 93% 95%  98%  Weight:      Height:        Intake/Output Summary (Last 24 hours) at 06/11/2017 1346 Last data filed at 06/11/2017 0602 Gross per 24 hour  Intake 2000 ml  Output 850 ml  Net 1150 ml     Wt Readings from Last 3 Encounters:  06/08/17 78 kg (172 lb)  07/21/16 78.4 kg (172 lb 12.8 oz)  06/19/16 80.6 kg (177 lb 11.1 oz)     Exam  General: Alert and oriented x 3, NAD  Eyes:   HEENT:   Cardiovascular: S1 S2 auscultated, Regular rate and rhythm.  Respiratory: Clear to auscultation bilaterally, no wheezing, rales or rhonchi  Gastrointestinal: Soft, nontender, nondistended, + bowel sounds  Ext: no pedal edema bilaterally  Neuro: no neuro deficits   Musculoskeletal: No digital cyanosis, clubbing  Skin: No rashes  Psych: Normal affect and demeanor, alert and oriented x3      Data Reviewed:  I have personally reviewed following labs and imaging studies  Micro Results Recent Results (from the past 240 hour(s))  Urine Culture     Status: Abnormal (Preliminary result)   Collection Time: 06/10/17  6:30 AM  Result Value Ref Range Status   Specimen Description URINE, RANDOM  Final   Special Requests NONE  Final   Culture >=100,000 COLONIES/mL GRAM NEGATIVE RODS (A)  Final   Report Status PENDING  Incomplete    Radiology Reports Dg Knee 1-2 Views Left  Result Date: 06/08/2017 CLINICAL DATA:  Pain of left fibula. Pt stated that  she fell this morning around 7am, falling to her left and landing on her left side. She said her entire left knee jt hurts wether she flexes or extends her knee. And she has pain in her anterior and lateral left shoulder with a red colored bruise on the lateral side of her left shoulder. EXAM: LEFT KNEE - 1-2 VIEW COMPARISON:  None. FINDINGS: No fracture. Knee  joint is normally spaced and aligned. No significant arthropathic change. No joint effusion. Bones are demineralized.  No bone lesion. Mild lateral subcutaneous soft tissue edema is noted. IMPRESSION: No fracture or dislocation. Electronically Signed   By: Lajean Manes M.D.   On: 06/08/2017 20:53   Ct Head Wo Contrast  Result Date: 06/08/2017 CLINICAL DATA:  Pt states she fell and hit head on shower today. Pt denies LOC. Pt denies head/neck pain. EXAM: CT HEAD WITHOUT CONTRAST CT CERVICAL SPINE WITHOUT CONTRAST TECHNIQUE: Multidetector CT imaging of the head and cervical spine was performed following the standard protocol without intravenous contrast. Multiplanar CT image reconstructions of the cervical spine were also generated. COMPARISON:  03/16/2017 FINDINGS: CT HEAD FINDINGS Brain: No evidence of acute infarction, hemorrhage, hydrocephalus, extra-axial collection or mass lesion/mass effect. There is ventricular and sulcal enlargement reflecting moderate diffuse atrophy. Patchy white matter hypoattenuation is noted consistent with moderate chronic microvascular ischemic change. Vascular: No hyperdense vessel or unexpected calcification. Skull: Normal. Negative for fracture or focal lesion. Sinuses/Orbits: Globes and orbits are unremarkable. Visualized sinuses and mastoid air cells are clear. Other: None. CT CERVICAL SPINE FINDINGS Alignment: Normal. Skull base and vertebrae: No acute fracture. No primary bone lesion or focal pathologic process. Soft tissues and spinal canal: No prevertebral fluid or swelling. No visible canal hematoma. Disc levels:  Mild loss of disc height at C6-C7. There are endplate osteophytes from C4 through C7. Remaining disc spaces are well preserved. There are significant facet degenerative changes most evident on the right from C2-C3 through C6-C7. No central stenosis.  No convincing disc herniation. Upper chest: No acute finding. No masses or adenopathy. Minor scarring at the lung apices. Other: None. IMPRESSION: HEAD CT 1. No acute intracranial abnormalities. 2. Atrophy and chronic microvascular ischemic change. CERVICAL CT 1. No fracture or acute finding. Electronically Signed   By: Lajean Manes M.D.   On: 06/08/2017 20:27   Ct Cervical Spine Wo Contrast  Result Date: 06/08/2017 CLINICAL DATA:  Pt states she fell and hit head on shower today. Pt denies LOC. Pt denies head/neck pain. EXAM: CT HEAD WITHOUT CONTRAST CT CERVICAL SPINE WITHOUT CONTRAST TECHNIQUE: Multidetector CT imaging of the head and cervical spine was performed following the standard protocol without intravenous contrast. Multiplanar CT image reconstructions of the cervical spine were also generated. COMPARISON:  03/16/2017 FINDINGS: CT HEAD FINDINGS Brain: No evidence of acute infarction, hemorrhage, hydrocephalus, extra-axial collection or mass lesion/mass effect. There is ventricular and sulcal enlargement reflecting moderate diffuse atrophy. Patchy white matter hypoattenuation is noted consistent with moderate chronic microvascular ischemic change. Vascular: No hyperdense vessel or unexpected calcification. Skull: Normal. Negative for fracture or focal lesion. Sinuses/Orbits: Globes and orbits are unremarkable. Visualized sinuses and mastoid air cells are clear. Other: None. CT CERVICAL SPINE FINDINGS Alignment: Normal. Skull base and vertebrae: No acute fracture. No primary bone lesion or focal pathologic process. Soft tissues and spinal canal: No prevertebral fluid or swelling. No visible canal hematoma. Disc levels: Mild loss of disc height at C6-C7.  There are endplate osteophytes from C4 through C7. Remaining disc spaces are well preserved. There are significant facet degenerative changes most evident on the right from C2-C3 through C6-C7. No central stenosis.  No convincing disc herniation. Upper chest: No acute finding. No masses or adenopathy. Minor scarring at the lung apices. Other: None. IMPRESSION: HEAD CT 1. No acute intracranial abnormalities. 2. Atrophy and chronic microvascular ischemic change. CERVICAL CT 1. No fracture or acute finding. Electronically Signed  By: Lajean Manes M.D.   On: 06/08/2017 20:27   Ct Hip Left Wo Contrast  Result Date: 06/08/2017 CLINICAL DATA:  Left hip pain status post fall today. EXAM: CT OF THE LEFT HIP WITHOUT CONTRAST TECHNIQUE: Multidetector CT imaging of the left hip was performed according to the standard protocol. Multiplanar CT image reconstructions were also generated. COMPARISON:  None. FINDINGS: Bones/Joint/Cartilage Left total hip arthroplasty without hardware failure or complication. No periarticular fluid collection or osteolysis. Nondisplaced fracture of the left posterior acetabulum adjacent to the acetabular cup component of the prosthesis. Comminuted and displaced left inferior pubic ramus fracture. Normal alignment. No joint effusion. No aggressive osseous lesion. Ligaments Ligaments are suboptimally evaluated by CT. Muscles and Tendons Small intramuscular hematoma in the left obturator externus muscle. No other focal muscle signal abnormality. Soft tissue No fluid collection or hematoma.  No soft tissue mass. IMPRESSION: 1. Comminuted and displaced left inferior pubic ramus fracture. 2. Nondisplaced fracture of the left posterior acetabulum adjacent to the acetabular cup component of the prosthesis. Electronically Signed   By: Kathreen Devoid   On: 06/08/2017 15:37   Dg Shoulder Left  Result Date: 06/08/2017 CLINICAL DATA:  Pain of left fibula. Pt stated that she fell this morning around 7am,  falling to her left and landing on her left side. She said her entire left knee jt hurts wether she flexes or extends her knee. And she has pain in her anterior and lateral left shoulder with a red colored bruise on the lateral side of her left shoulder. EXAM: LEFT SHOULDER - 2+ VIEW COMPARISON:  None. FINDINGS: No fracture or dislocation. Bones are demineralized. No bone lesion. There are no significant arthropathic changes. Soft tissues are unremarkable. IMPRESSION: No fracture or dislocation. Electronically Signed   By: Lajean Manes M.D.   On: 06/08/2017 20:54   Dg Hip Unilat W Or Wo Pelvis 2-3 Views Left  Result Date: 06/08/2017 CLINICAL DATA:  Pt states she fell in her bathroom this a.m. because her left foot went to sleep and when she went to bear weight on her foot, she fell down onto her left hip. EXAM: DG HIP (WITH OR WITHOUT PELVIS) 2-3V LEFT COMPARISON:  CT abdomen and pelvis dated 12/11/2008. FINDINGS: Left hip arthroplasty hardware appears intact and appropriately positioned. No fracture line or displaced fracture fragment seen about the left acetabulum or within the proximal left femur. There is a displaced fracture deformity of the left inferior pubic ramus, of uncertain age, new compared to the CT of 12/11/2008. Remainder of the osseous pelvis appears intact and normally aligned. Degenerative changes noted at the right hip, at least moderate in degree with joint space narrowing and osseous spurring. IMPRESSION: 1. Displaced fracture deformity of the left inferior pubic ramus, of uncertain age, new compared to most recent comparison imaging which is a CT of 12/11/2008. Favor chronic but of uncertain age. 2. Left hip arthroplasty hardware appears intact and appropriately positioned. No fracture seen about the left acetabulum or within the proximal left femur. 3. Degenerative osteoarthritis at the right hip joint, moderate in degree. Electronically Signed   By: Franki Cabot M.D.   On: 06/08/2017  14:11    Lab Data:  CBC: Recent Labs  Lab 06/08/17 1911 06/09/17 0444 06/10/17 0818 06/11/17 0714  WBC 8.9 10.2 9.8 9.0  NEUTROABS 5.9  --   --   --   HGB 12.3 11.6* 11.0* 11.0*  HCT 36.8 35.1* 33.5* 33.2*  MCV 94.1 94.4 94.9 94.3  PLT 201 198 182 165   Basic Metabolic Panel: Recent Labs  Lab 06/08/17 1911 06/08/17 1916 06/09/17 0444 06/10/17 0818 06/11/17 0714  NA 133*  --  132* 134* 135  K 3.6  --  4.1 4.0 3.8  CL 97*  --  99* 102 101  CO2 26  --  24 24 23   GLUCOSE 113*  --  111* 104* 101*  BUN 10  --  10 7 5*  CREATININE 0.54  --  0.53 0.42* 0.38*  CALCIUM 9.0  --  9.0 8.2* 8.3*  MG  --  1.9  --   --   --   PHOS  --  3.4  --   --   --    GFR: Estimated Creatinine Clearance: 45.1 mL/min (A) (by C-G formula based on SCr of 0.38 mg/dL (L)). Liver Function Tests: Recent Labs  Lab 06/08/17 1911  AST 26  ALT 21  ALKPHOS 57  BILITOT 0.7  PROT 5.9*  ALBUMIN 3.3*   No results for input(s): LIPASE, AMYLASE in the last 168 hours. No results for input(s): AMMONIA in the last 168 hours. Coagulation Profile: No results for input(s): INR, PROTIME in the last 168 hours. Cardiac Enzymes: Recent Labs  Lab 06/08/17 1911  CKTOTAL 87  TROPONINI <0.03   BNP (last 3 results) No results for input(s): PROBNP in the last 8760 hours. HbA1C: No results for input(s): HGBA1C in the last 72 hours. CBG: No results for input(s): GLUCAP in the last 168 hours. Lipid Profile: No results for input(s): CHOL, HDL, LDLCALC, TRIG, CHOLHDL, LDLDIRECT in the last 72 hours. Thyroid Function Tests: Recent Labs    06/08/17 1916 06/09/17 1100  TSH 6.545*  --   FREET4  --  0.94   Anemia Panel: No results for input(s): VITAMINB12, FOLATE, FERRITIN, TIBC, IRON, RETICCTPCT in the last 72 hours. Urine analysis:    Component Value Date/Time   COLORURINE YELLOW 06/09/2017 1108   APPEARANCEUR HAZY (A) 06/09/2017 1108   LABSPEC 1.018 06/09/2017 1108   PHURINE 5.0 06/09/2017 1108    GLUCOSEU NEGATIVE 06/09/2017 1108   HGBUR NEGATIVE 06/09/2017 1108   [place] 06/09/2017 1108   [place] 06/09/2017 1108   PROTEINUR NEGATIVE 06/09/2017 1108   UROBILINOGEN 0.2 07/12/2012 1448   NITRITE POSITIVE (A) 06/09/2017 1108   LEUKOCYTESUR LARGE (A) 06/09/2017 1108     Jihaad Bruschi M.D. [facility] 06/11/2017, 1:46 PM  Pager: [phone] Between 7am to 7pm - call Pager - [phone]  After 7pm go to [email or web address] - password [email or web address]  Call night coverage person covering after 7pm

## 2017-06-11 NOTE — Progress Notes (Signed)
Physical Therapy Treatment Patient Details Name: Gina Bowers MRN: [id number] DOB: 12-30-1925 Today's Date: 06/11/2017    History of Present Illness Pt is an 84 y/o female who presents s/p fall at home. She sustained a pubic rami fracture, and L non-displaced fracture of L posterior acetabulum adjacent to prosthesis from prior THA. She is currently NWB on the LLE. PMH significant for vertigo, UTI, CVA, CAD, CHF.    PT Comments    Pt progressing towards physical therapy goals. Was able to transition to EOB with increased independence this session, and is demonstrating more active movement with the LLE overall. Pt was able to tolerate therapeutic exercise with reports of minimal increase in pain at end of session. SNF remains the most appropriate d/c disposition. Will continue to follow and progress as able per POC.    Follow Up Recommendations  SNF;Supervision/Assistance - 24 hour     Equipment Recommendations  Rolling walker with 5" wheels    Recommendations for Other Services       Precautions / Restrictions Precautions Precautions: Fall Restrictions Weight Bearing Restrictions: No LLE Weight Bearing: Non weight bearing    Mobility  Bed Mobility Overal bed mobility: Needs Assistance Bed Mobility: Supine to Sit     Supine to sit: Mod assist     General bed mobility comments: Pt was able to advance LE's towards EOB initially. Bed pad was used to assist with scooting hips and mod assist provided for elevating trunk to full sitting position.   Transfers Overall transfer level: Needs assistance Equipment used: 2 person hand held assist Transfers: Sit to/from [employer] Sit to Stand: Max assist;+2 physical assistance   Squat pivot transfers: Max assist;+2 physical assistance     General transfer comment: Assist for power-up to full stand as well as for pivot around to chair. Bed pad under hips for increased support. Therapist kept foot under LLE to  ensure maintenance of NWB status during transfer.   Ambulation/Gait             General Gait Details: Unable   Stairs            Wheelchair Mobility    Modified Rankin (Stroke Patients Only)       Balance Overall balance assessment: Needs assistance Sitting-balance support: Feet supported;No upper extremity supported Sitting balance-Leahy Scale: Fair     Standing balance support: Bilateral upper extremity supported Standing balance-Leahy Scale: Zero Standing balance comment: +2 required                            Cognition Arousal/Alertness: Awake/alert Behavior During Therapy: WFL for tasks assessed/performed Overall Cognitive Status: Within Functional Limits for tasks assessed                                        Exercises General Exercises - Lower Extremity Long Arc Quad: 10 reps Heel Slides: 10 reps Hip ABduction/ADduction: 10 reps    General Comments        Pertinent Vitals/Pain Pain Assessment: Faces Faces Pain Scale: Hurts little more Pain Location: LLE Pain Descriptors / Indicators: Discomfort Pain Intervention(s): Limited activity within patient's tolerance;Monitored during session;Repositioned    Home Living                      Prior Function  PT Goals (current goals can now be found in the care plan section) Acute Rehab PT Goals Patient Stated Goal: Get stronger PT Goal Formulation: With patient/family Time For Goal Achievement: 06/23/17 Potential to Achieve Goals: Good Progress towards PT goals: Progressing toward goals    Frequency    Min 3X/week      PT Plan Current plan remains appropriate    Co-evaluation              AM-PAC PT "6 Clicks" Daily Activity  Outcome Measure  Difficulty turning over in bed (including adjusting bedclothes, sheets and blankets)?: Unable Difficulty moving from lying on back to sitting on the side of the bed? : Unable Difficulty  sitting down on and standing up from a chair with arms (e.g., wheelchair, bedside commode, etc,.)?: Unable Help needed moving to and from a bed to chair (including a wheelchair)?: Total Help needed walking in hospital room?: Total Help needed climbing 3-5 steps with a railing? : Total 6 Click Score: 6    End of Session Equipment Utilized During Treatment: Gait belt Activity Tolerance: Patient limited by fatigue(weakness) Patient left: in chair;with call bell/phone within reach;with family/visitor present Nurse Communication: Mobility status PT Visit Diagnosis: Pain;Difficulty in walking, not elsewhere classified (R26.2) Pain - Right/Left: Left Pain - part of body: Hip     Time: 8242-3536 PT Time Calculation (min) (ACUTE ONLY): 19 min  Charges:  $Gait Training: 8-22 mins                    G Codes:       Rolinda Roan, PT, DPT Acute Rehabilitation Services Pager: [place] 06/11/2017, 1:43 PM

## 2017-06-12 DIAGNOSIS — R0602 Shortness of breath: Secondary | ICD-10-CM | POA: Diagnosis not present

## 2017-06-12 DIAGNOSIS — S3993XA Unspecified injury of pelvis, initial encounter: Secondary | ICD-10-CM | POA: Diagnosis not present

## 2017-06-12 DIAGNOSIS — I509 Heart failure, unspecified: Secondary | ICD-10-CM | POA: Diagnosis not present

## 2017-06-12 DIAGNOSIS — S32425D Nondisplaced fracture of posterior wall of left acetabulum, subsequent encounter for fracture with routine healing: Secondary | ICD-10-CM | POA: Diagnosis not present

## 2017-06-12 DIAGNOSIS — R278 Other lack of coordination: Secondary | ICD-10-CM | POA: Diagnosis not present

## 2017-06-12 DIAGNOSIS — W19XXXA Unspecified fall, initial encounter: Secondary | ICD-10-CM | POA: Diagnosis not present

## 2017-06-12 DIAGNOSIS — S32592D Other specified fracture of left pubis, subsequent encounter for fracture with routine healing: Secondary | ICD-10-CM | POA: Diagnosis not present

## 2017-06-12 DIAGNOSIS — K5900 Constipation, unspecified: Secondary | ICD-10-CM | POA: Diagnosis not present

## 2017-06-12 DIAGNOSIS — E785 Hyperlipidemia, unspecified: Secondary | ICD-10-CM | POA: Diagnosis not present

## 2017-06-12 DIAGNOSIS — M25552 Pain in left hip: Secondary | ICD-10-CM | POA: Diagnosis not present

## 2017-06-12 DIAGNOSIS — Z9181 History of falling: Secondary | ICD-10-CM | POA: Diagnosis not present

## 2017-06-12 DIAGNOSIS — R350 Frequency of micturition: Secondary | ICD-10-CM | POA: Diagnosis not present

## 2017-06-12 DIAGNOSIS — S32592A Other specified fracture of left pubis, initial encounter for closed fracture: Secondary | ICD-10-CM | POA: Diagnosis not present

## 2017-06-12 DIAGNOSIS — R062 Wheezing: Secondary | ICD-10-CM | POA: Diagnosis not present

## 2017-06-12 DIAGNOSIS — E039 Hypothyroidism, unspecified: Secondary | ICD-10-CM | POA: Diagnosis not present

## 2017-06-12 DIAGNOSIS — Z792 Long term (current) use of antibiotics: Secondary | ICD-10-CM | POA: Diagnosis not present

## 2017-06-12 DIAGNOSIS — H8149 Vertigo of central origin, unspecified ear: Secondary | ICD-10-CM | POA: Diagnosis not present

## 2017-06-12 DIAGNOSIS — S32502S Unspecified fracture of left pubis, sequela: Secondary | ICD-10-CM | POA: Diagnosis not present

## 2017-06-12 DIAGNOSIS — R0600 Dyspnea, unspecified: Secondary | ICD-10-CM | POA: Diagnosis not present

## 2017-06-12 DIAGNOSIS — R52 Pain, unspecified: Secondary | ICD-10-CM | POA: Diagnosis not present

## 2017-06-12 DIAGNOSIS — F419 Anxiety disorder, unspecified: Secondary | ICD-10-CM | POA: Diagnosis not present

## 2017-06-12 DIAGNOSIS — R05 Cough: Secondary | ICD-10-CM | POA: Diagnosis not present

## 2017-06-12 DIAGNOSIS — L02211 Cutaneous abscess of abdominal wall: Secondary | ICD-10-CM | POA: Diagnosis not present

## 2017-06-12 DIAGNOSIS — M6281 Muscle weakness (generalized): Secondary | ICD-10-CM | POA: Diagnosis not present

## 2017-06-12 DIAGNOSIS — A4152 Sepsis due to Pseudomonas: Secondary | ICD-10-CM | POA: Diagnosis not present

## 2017-06-12 DIAGNOSIS — G8911 Acute pain due to trauma: Secondary | ICD-10-CM | POA: Diagnosis not present

## 2017-06-12 DIAGNOSIS — K219 Gastro-esophageal reflux disease without esophagitis: Secondary | ICD-10-CM | POA: Diagnosis not present

## 2017-06-12 DIAGNOSIS — N390 Urinary tract infection, site not specified: Secondary | ICD-10-CM | POA: Diagnosis not present

## 2017-06-12 DIAGNOSIS — R2689 Other abnormalities of gait and mobility: Secondary | ICD-10-CM | POA: Diagnosis not present

## 2017-06-12 DIAGNOSIS — Y92009 Unspecified place in unspecified non-institutional (private) residence as the place of occurrence of the external cause: Secondary | ICD-10-CM | POA: Diagnosis not present

## 2017-06-12 DIAGNOSIS — F329 Major depressive disorder, single episode, unspecified: Secondary | ICD-10-CM | POA: Diagnosis not present

## 2017-06-12 DIAGNOSIS — R110 Nausea: Secondary | ICD-10-CM | POA: Diagnosis not present

## 2017-06-12 DIAGNOSIS — M79662 Pain in left lower leg: Secondary | ICD-10-CM | POA: Diagnosis not present

## 2017-06-12 DIAGNOSIS — R634 Abnormal weight loss: Secondary | ICD-10-CM | POA: Diagnosis not present

## 2017-06-12 DIAGNOSIS — S329XXA Fracture of unspecified parts of lumbosacral spine and pelvis, initial encounter for closed fracture: Secondary | ICD-10-CM | POA: Diagnosis not present

## 2017-06-12 DIAGNOSIS — I10 Essential (primary) hypertension: Secondary | ICD-10-CM | POA: Diagnosis not present

## 2017-06-12 DIAGNOSIS — N329 Bladder disorder, unspecified: Secondary | ICD-10-CM | POA: Diagnosis not present

## 2017-06-12 DIAGNOSIS — E569 Vitamin deficiency, unspecified: Secondary | ICD-10-CM | POA: Diagnosis not present

## 2017-06-12 DIAGNOSIS — R42 Dizziness and giddiness: Secondary | ICD-10-CM | POA: Diagnosis not present

## 2017-06-12 DIAGNOSIS — H04129 Dry eye syndrome of unspecified lacrimal gland: Secondary | ICD-10-CM | POA: Diagnosis not present

## 2017-06-12 DIAGNOSIS — F322 Major depressive disorder, single episode, severe without psychotic features: Secondary | ICD-10-CM | POA: Diagnosis not present

## 2017-06-12 DIAGNOSIS — F411 Generalized anxiety disorder: Secondary | ICD-10-CM | POA: Diagnosis not present

## 2017-06-12 DIAGNOSIS — M810 Age-related osteoporosis without current pathological fracture: Secondary | ICD-10-CM | POA: Diagnosis not present

## 2017-06-12 DIAGNOSIS — I2510 Atherosclerotic heart disease of native coronary artery without angina pectoris: Secondary | ICD-10-CM | POA: Diagnosis not present

## 2017-06-12 DIAGNOSIS — Z111 Encounter for screening for respiratory tuberculosis: Secondary | ICD-10-CM | POA: Diagnosis not present

## 2017-06-12 DIAGNOSIS — S32425A Nondisplaced fracture of posterior wall of left acetabulum, initial encounter for closed fracture: Secondary | ICD-10-CM | POA: Diagnosis not present

## 2017-06-12 LAB — CBC
HCT: 32.3 % — ABNORMAL LOW (ref 36.0–46.0)
Hemoglobin: 10.6 g/dL — ABNORMAL LOW (ref 12.0–15.0)
MCH: 31 pg (ref 26.0–34.0)
MCHC: 32.8 g/dL (ref 30.0–36.0)
MCV: 94.4 fL (ref 78.0–100.0)
PLATELETS: 181 10*3/uL (ref 150–400)
RBC: 3.42 MIL/uL — ABNORMAL LOW (ref 3.87–5.11)
RDW: 12.9 % (ref 11.5–15.5)
WBC: 8.1 10*3/uL (ref 4.0–10.5)

## 2017-06-12 LAB — BASIC METABOLIC PANEL
Anion gap: 13 (ref 5–15)
BUN: 12 mg/dL (ref 6–20)
CALCIUM: 7.8 mg/dL — AB (ref 8.9–10.3)
CO2: 25 mmol/L (ref 22–32)
Chloride: 94 mmol/L — ABNORMAL LOW (ref 101–111)
Creatinine, Ser: 0.46 mg/dL (ref 0.44–1.00)
GLUCOSE: 223 mg/dL — AB (ref 65–99)
Potassium: 3.7 mmol/L (ref 3.5–5.1)
SODIUM: 132 mmol/L — AB (ref 135–145)

## 2017-06-12 LAB — URINE CULTURE

## 2017-06-12 MED ORDER — CIPROFLOXACIN HCL 250 MG PO TABS: 250.0000 mg | ORAL_TABLET | Freq: Two times a day (BID) | ORAL | Status: DC

## 2017-06-12 MED ORDER — HYDROCODONE-ACETAMINOPHEN 5-325 MG PO TABS: 1.0000 | ORAL_TABLET | ORAL | 0 refills | Status: DC | PRN

## 2017-06-12 MED ORDER — SOLIFENACIN SUCCINATE 5 MG PO TABS: 5.0000 mg | ORAL_TABLET | Freq: Every evening | ORAL | Status: DC

## 2017-06-12 MED ORDER — LEVOTHYROXINE SODIUM 88 MCG PO TABS: 88.0000 ug | ORAL_TABLET | Freq: Every day | ORAL | Status: AC

## 2017-06-12 MED ORDER — ONDANSETRON 4 MG PO TBDP: 4.0000 mg | ORAL_TABLET | Freq: Four times a day (QID) | ORAL | Status: AC | PRN

## 2017-06-12 MED ORDER — CIPROFLOXACIN HCL 500 MG PO TABS
250.0000 mg | ORAL_TABLET | Freq: Two times a day (BID) | ORAL | Status: DC
  Administered 2017-06-12: 250 mg via ORAL
  Filled 2017-06-12: qty 1

## 2017-06-12 MED ORDER — SENNOSIDES-DOCUSATE SODIUM 8.6-50 MG PO TABS: 1.0000 | ORAL_TABLET | Freq: Two times a day (BID) | ORAL | Status: DC

## 2017-06-12 NOTE — Care Management Note (Signed)
Case Management Note  Patient Details  Name: DYANN GOODSPEED MRN: [id number] Date of Birth: [birth year]  Subjective/Objective:                 Patient with order to discharge to [facility] (SNF). SNF discharge facilitated through [place] ([place]). Please refer to Time notes for disposition plan and direct questions CSW on call accordingly. CM signing off   Action/Plan:   Expected Discharge Date:  06/12/17               Expected Discharge Plan:  [place]  In-House Referral:  Clinical Social Work  Discharge planning Services  CM Consult  Post Acute Care Choice:    Choice offered to:     DME Arranged:    DME Agency:     HH Arranged:    [place] Agency:     Status of Service:  Completed, signed off  If discussed at [employer], dates discussed:    Additional Comments:  Carles Collet, RN 06/12/2017, 10:47 AM

## 2017-06-12 NOTE — Discharge Summary (Signed)
Physician Discharge Summary   Patient ID: Gina Bowers MRN: [id number] DOB/AGE: 01/02/1926 84 y.o.  Admit date: 06/08/2017 Discharge date: 06/12/2017  Primary Care Physician:  Lavone Orn, MD  Discharge Diagnoses:    . Pubic ramus fracture ([place]) . Left acetabulum fracture ([place]) . Hypothyroidism . Hyperlipidemia . Anxiety state . Obstructive sleep apnea Pseudomonas UTI Hyponatremia Constipation   Consults: Orthopedics, Dr. Durward Fortes  Recommendations for Outpatient Follow-up:  1. PT eval recommended skilled nursing facility for rehab 2. Please repeat CBC/BMET at next visit 3. Continue ciprofloxacin to 50 mg twice a day for 1 week.  After completing ciprofloxacin, patient may benefit from [place] at the time for UTI prophylaxis 4. Orthopedics recommendations Maintain bed to chair for at least 4-6 weeks to allow pelvic fractures to heal. Would like to see in office in 2-3 weeks to monitor films.    DIET: Heart healthy diet    Allergies:   Allergies  Allergen Reactions  . Sulfa Antibiotics Hives  . Sulfonamide Derivatives Hives     DISCHARGE MEDICATIONS: Allergies as of 06/12/2017      Reactions   Sulfa Antibiotics Hives   Sulfonamide Derivatives Hives      Medication List    STOP taking these medications   cephALEXin 250 MG capsule Commonly known as:  KEFLEX     TAKE these medications   acetaminophen 500 MG tablet Commonly known as:  TYLENOL Take 1,000 mg by mouth every 6 (six) hours as needed for mild pain or fever.   alendronate 70 MG tablet Commonly known as:  FOSAMAX Take 1 tablet (70 mg total) by mouth once a week. Take with a full glass of water on an empty stomach.mon What changed:    when to take this  additional instructions   aspirin EC 81 MG tablet Take 81 mg by mouth every evening.   CALCIUM 1000 + D PO Take 1 tablet by mouth every evening.   ciprofloxacin 250 MG tablet Commonly known as:  CIPRO Take 1 tablet (250 mg total)  by mouth 2 (two) times daily. For 7 days   clopidogrel 75 MG tablet Commonly known as:  PLAVIX Take 1 tablet (75 mg total) by mouth daily.   CRANBERRY EXTRACT PO Take 500 mg by mouth 2 (two) times daily.   fish oil-omega-3 fatty acids 1000 MG capsule Take 1 g by mouth every evening.   HYDROcodone-acetaminophen 5-325 MG tablet Commonly known as:  NORCO/VICODIN Take 1 tablet by mouth every 4 (four) hours as needed for moderate pain or severe pain.   ipratropium-albuterol 0.5-2.5 (3) MG/3ML Soln Commonly known as:  DUONEB Take 3 mLs by nebulization every 4 (four) hours as needed.   isosorbide mononitrate 30 MG 24 hr tablet Commonly known as:  IMDUR TAKE 1/2 TABLET (15MG  TOTAL) BY MOUTH DAILY   levothyroxine 88 MCG tablet Commonly known as:  SYNTHROID, LEVOTHROID Take 1 tablet (88 mcg total) by mouth daily before breakfast. Start taking on:  06/13/2017 What changed:    medication strength  how much to take   losartan 25 MG tablet Commonly known as:  COZAAR TAKE ONE TABLET BY MOUTH TWICE A DAY   meclizine 25 MG tablet Commonly known as:  ANTIVERT Take 25 mg by mouth 3 (three) times daily as needed for dizziness.   metoprolol succinate 25 MG 24 hr tablet Commonly known as:  TOPROL-XL TAKE ONE TABLET BY MOUTH DAILY   multivitamin with minerals Tabs tablet Take 1 tablet by mouth daily with breakfast.  nitroGLYCERIN 0.4 MG SL tablet Commonly known as:  NITROSTAT Place 1 tablet (0.4 mg total) under the tongue every 5 (five) minutes as needed for chest pain.   ondansetron 4 MG disintegrating tablet Commonly known as:  ZOFRAN ODT Take 1 tablet (4 mg total) by mouth every 6 (six) hours as needed for nausea or vomiting.   OVER THE COUNTER MEDICATION See admin instructions. Immuplex Capsule: Take 1 capsule by mouth 2 times a day   OVER THE COUNTER MEDICATION Take 1 capsule by mouth 2 (two) times daily. D-MANOS   pantoprazole 40 MG tablet Commonly known as:   PROTONIX Take 1 tablet (40 mg total) by mouth daily. NEEDS APPOINTMENT FOR FUTURE REFILLS   PROAIR RESPICLICK 588 (90 Base) MCG/ACT Aepb Generic drug:  Albuterol Sulfate Inhale 2 puffs into the lungs daily as needed (shortness of breath).   Probiotic Caps Take 1 capsule by mouth daily with breakfast.   rosuvastatin 20 MG tablet Commonly known as:  CRESTOR TAKE ONE TABLET BY MOUTH DAILY   senna-docusate 8.6-50 MG tablet Commonly known as:  Senokot-S Take 1 tablet by mouth 2 (two) times daily.   sertraline 50 MG tablet Commonly known as:  ZOLOFT Take 1 tablet (50 mg total) by mouth daily.   solifenacin 5 MG tablet Commonly known as:  VESICARE Take 1 tablet (5 mg total) by mouth every evening.   SYSTANE OP Place 1 drop into both eyes 2 (two) times daily. Patient states its the Gel form (soothes)        Brief H and P: For complete details please refer to admission H and P, but in brief Patient is a 84 year old female with osteoporosis, CAD status post CABG, hypothyroidism, hyperlipidemia stented from independent living facility with left hip pain.  Per patient, she woke up on the morning of admission to go to the bathroom, when she stood up her left leg went out and patient fell on her bathroom, hitting her head, left shoulder as well.  Patient reports that she was on the floor for 3 hours.  Later in the day her home health nurse found her on the floor and EMS was called. X-ray hip showed pubic rami fracture.  CT hip showed nondisplaced fracture of the left posterior stable M adjacent to prosthesis.   Hospital Course:  Fall at home, initial encounter, pubic rami fracture, left stable arm fracture nondisplaced -Orthopedics was consulted, seen by Dr. Durward Fortes recommended nonweightbearing left lower extremity for 6 weeks, follow-up after that for further management.  Patient will benefit from  skilled nursing facility rehab -PT OT evaluation recommended skilled nursing facility   -Pain better controlled with meds, placed on bowel regimen -CK normal -CT head and C-spine with no acute findings   Pseudomonas UTI urinary tract infection with history of recurrent UTIs -Patient was placed on IV Rocephin, transition to oral ciprofloxacin per sensitivities for Pseudomonas UTI. -Given patient has recurrent UTIs, may benefit from Macrobid at bedtime for prophylaxis.  Hyponatremia -Corrected sodium 134 at the time of discharge.  Patient received IV fluid hydration    Hypothyroidism -TSH 6.5, T3 low 64, free T4 0.9. -Increase Synthroid to 88 MCG daily    Hyperlipidemia -Continue statin  Osteoporosis -Continue Fosamax weekly, patient takes it on Mondays  History of depression/anxiety -Currently stable, no SI/HI, continue Zoloft  Hypertension -Continue Cozaar, Toprol, imdur.  BP readings somewhat elevated due to pain  History of CAD -Continue Toprol, Cozaar, Imdur, statin, Plavix    Day of Discharge  BP (!) 170/64 (BP Location: Right Arm)   Pulse (!) 58   Temp 98.2 F (36.8 C) (Oral)   Resp 15   Ht 5\' 1"  (1.549 m)   Wt 78 kg (172 lb)   SpO2 94%   BMI 32.50 kg/m   Physical Exam: General: Alert and awake oriented x3 not in any acute distress. HEENT: anicteric sclera, pupils reactive to light and accommodation CVS: S1-S2 clear no murmur rubs or gallops Chest: clear to auscultation bilaterally, no wheezing rales or rhonchi Abdomen: soft nontender, nondistended, normal bowel sounds Extremities: no cyanosis, clubbing or edema noted bilaterally    The results of significant diagnostics from this hospitalization (including imaging, microbiology, ancillary and laboratory) are listed below for reference.    LAB RESULTS: Basic Metabolic Panel: Recent Labs  Lab 06/08/17 1916  06/11/17 0714 06/12/17 0557  NA  --    < > 135 132*  K  --    < > 3.8 3.7  CL  --    < > 101 94*  CO2  --    < > 23 25  GLUCOSE  --    < > 101* 223*  BUN  --    < > 5*  12  CREATININE  --    < > 0.38* 0.46  CALCIUM  --    < > 8.3* 7.8*  MG 1.9  --   --   --   PHOS 3.4  --   --   --    < > = values in this interval not displayed.   Liver Function Tests: Recent Labs  Lab 06/08/17 1911  AST 26  ALT 21  ALKPHOS 57  BILITOT 0.7  PROT 5.9*  ALBUMIN 3.3*   No results for input(s): LIPASE, AMYLASE in the last 168 hours. No results for input(s): AMMONIA in the last 168 hours. CBC: Recent Labs  Lab 06/08/17 1911  06/11/17 0714 06/12/17 0557  WBC 8.9   < > 9.0 8.1  NEUTROABS 5.9  --   --   --   HGB 12.3   < > 11.0* 10.6*  HCT 36.8   < > 33.2* 32.3*  MCV 94.1   < > 94.3 94.4  PLT 201   < > 175 181   < > = values in this interval not displayed.   Cardiac Enzymes: Recent Labs  Lab 06/08/17 1911  CKTOTAL 67  TROPONINI <0.03   BNP: Invalid input(s): POCBNP CBG: No results for input(s): GLUCAP in the last 168 hours.  Significant Diagnostic Studies:  Dg Knee 1-2 Views Left  Result Date: 06/08/2017 CLINICAL DATA:  Pain of left fibula. Pt stated that she fell this morning around 7am, falling to her left and landing on her left side. She said her entire left knee jt hurts wether she flexes or extends her knee. And she has pain in her anterior and lateral left shoulder with a red colored bruise on the lateral side of her left shoulder. EXAM: LEFT KNEE - 1-2 VIEW COMPARISON:  None. FINDINGS: No fracture. Knee joint is normally spaced and aligned. No significant arthropathic change. No joint effusion. Bones are demineralized.  No bone lesion. Mild lateral subcutaneous soft tissue edema is noted. IMPRESSION: No fracture or dislocation. Electronically Signed   By: Lajean Manes M.D.   On: 06/08/2017 20:53   Ct Head Wo Contrast  Result Date: 06/08/2017 CLINICAL DATA:  Pt states she fell and hit head on shower today. Pt denies LOC. Pt  denies head/neck pain. EXAM: CT HEAD WITHOUT CONTRAST CT CERVICAL SPINE WITHOUT CONTRAST TECHNIQUE: Multidetector CT imaging  of the head and cervical spine was performed following the standard protocol without intravenous contrast. Multiplanar CT image reconstructions of the cervical spine were also generated. COMPARISON:  03/16/2017 FINDINGS: CT HEAD FINDINGS Brain: No evidence of acute infarction, hemorrhage, hydrocephalus, extra-axial collection or mass lesion/mass effect. There is ventricular and sulcal enlargement reflecting moderate diffuse atrophy. Patchy white matter hypoattenuation is noted consistent with moderate chronic microvascular ischemic change. Vascular: No hyperdense vessel or unexpected calcification. Skull: Normal. Negative for fracture or focal lesion. Sinuses/Orbits: Globes and orbits are unremarkable. Visualized sinuses and mastoid air cells are clear. Other: None. CT CERVICAL SPINE FINDINGS Alignment: Normal. Skull base and vertebrae: No acute fracture. No primary bone lesion or focal pathologic process. Soft tissues and spinal canal: No prevertebral fluid or swelling. No visible canal hematoma. Disc levels: Mild loss of disc height at C6-C7. There are endplate osteophytes from C4 through C7. Remaining disc spaces are well preserved. There are significant facet degenerative changes most evident on the right from C2-C3 through C6-C7. No central stenosis.  No convincing disc herniation. Upper chest: No acute finding. No masses or adenopathy. Minor scarring at the lung apices. Other: None. IMPRESSION: HEAD CT 1. No acute intracranial abnormalities. 2. Atrophy and chronic microvascular ischemic change. CERVICAL CT 1. No fracture or acute finding. Electronically Signed   By: Lajean Manes M.D.   On: 06/08/2017 20:27   Ct Cervical Spine Wo Contrast  Result Date: 06/08/2017 CLINICAL DATA:  Pt states she fell and hit head on shower today. Pt denies LOC. Pt denies head/neck pain. EXAM: CT HEAD WITHOUT CONTRAST CT CERVICAL SPINE WITHOUT CONTRAST TECHNIQUE: Multidetector CT imaging of the head and cervical spine was  performed following the standard protocol without intravenous contrast. Multiplanar CT image reconstructions of the cervical spine were also generated. COMPARISON:  03/16/2017 FINDINGS: CT HEAD FINDINGS Brain: No evidence of acute infarction, hemorrhage, hydrocephalus, extra-axial collection or mass lesion/mass effect. There is ventricular and sulcal enlargement reflecting moderate diffuse atrophy. Patchy white matter hypoattenuation is noted consistent with moderate chronic microvascular ischemic change. Vascular: No hyperdense vessel or unexpected calcification. Skull: Normal. Negative for fracture or focal lesion. Sinuses/Orbits: Globes and orbits are unremarkable. Visualized sinuses and mastoid air cells are clear. Other: None. CT CERVICAL SPINE FINDINGS Alignment: Normal. Skull base and vertebrae: No acute fracture. No primary bone lesion or focal pathologic process. Soft tissues and spinal canal: No prevertebral fluid or swelling. No visible canal hematoma. Disc levels: Mild loss of disc height at C6-C7. There are endplate osteophytes from C4 through C7. Remaining disc spaces are well preserved. There are significant facet degenerative changes most evident on the right from C2-C3 through C6-C7. No central stenosis.  No convincing disc herniation. Upper chest: No acute finding. No masses or adenopathy. Minor scarring at the lung apices. Other: None. IMPRESSION: HEAD CT 1. No acute intracranial abnormalities. 2. Atrophy and chronic microvascular ischemic change. CERVICAL CT 1. No fracture or acute finding. Electronically Signed   By: Lajean Manes M.D.   On: 06/08/2017 20:27   Ct Hip Left Wo Contrast  Result Date: 06/08/2017 CLINICAL DATA:  Left hip pain status post fall today. EXAM: CT OF THE LEFT HIP WITHOUT CONTRAST TECHNIQUE: Multidetector CT imaging of the left hip was performed according to the standard protocol. Multiplanar CT image reconstructions were also generated. COMPARISON:  None. FINDINGS:  Bones/Joint/Cartilage Left total hip arthroplasty without hardware failure  or complication. No periarticular fluid collection or osteolysis. Nondisplaced fracture of the left posterior acetabulum adjacent to the acetabular cup component of the prosthesis. Comminuted and displaced left inferior pubic ramus fracture. Normal alignment. No joint effusion. No aggressive osseous lesion. Ligaments Ligaments are suboptimally evaluated by CT. Muscles and Tendons Small intramuscular hematoma in the left obturator externus muscle. No other focal muscle signal abnormality. Soft tissue No fluid collection or hematoma.  No soft tissue mass. IMPRESSION: 1. Comminuted and displaced left inferior pubic ramus fracture. 2. Nondisplaced fracture of the left posterior acetabulum adjacent to the acetabular cup component of the prosthesis. Electronically Signed   By: Kathreen Devoid   On: 06/08/2017 15:37   Dg Shoulder Left  Result Date: 06/08/2017 CLINICAL DATA:  Pain of left fibula. Pt stated that she fell this morning around 7am, falling to her left and landing on her left side. She said her entire left knee jt hurts wether she flexes or extends her knee. And she has pain in her anterior and lateral left shoulder with a red colored bruise on the lateral side of her left shoulder. EXAM: LEFT SHOULDER - 2+ VIEW COMPARISON:  None. FINDINGS: No fracture or dislocation. Bones are demineralized. No bone lesion. There are no significant arthropathic changes. Soft tissues are unremarkable. IMPRESSION: No fracture or dislocation. Electronically Signed   By: Lajean Manes M.D.   On: 06/08/2017 20:54   Dg Hip Unilat W Or Wo Pelvis 2-3 Views Left  Result Date: 06/08/2017 CLINICAL DATA:  Pt states she fell in her bathroom this a.m. because her left foot went to sleep and when she went to bear weight on her foot, she fell down onto her left hip. EXAM: DG HIP (WITH OR WITHOUT PELVIS) 2-3V LEFT COMPARISON:  CT abdomen and pelvis dated 12/11/2008.  FINDINGS: Left hip arthroplasty hardware appears intact and appropriately positioned. No fracture line or displaced fracture fragment seen about the left acetabulum or within the proximal left femur. There is a displaced fracture deformity of the left inferior pubic ramus, of uncertain age, new compared to the CT of 12/11/2008. Remainder of the osseous pelvis appears intact and normally aligned. Degenerative changes noted at the right hip, at least moderate in degree with joint space narrowing and osseous spurring. IMPRESSION: 1. Displaced fracture deformity of the left inferior pubic ramus, of uncertain age, new compared to most recent comparison imaging which is a CT of 12/11/2008. Favor chronic but of uncertain age. 2. Left hip arthroplasty hardware appears intact and appropriately positioned. No fracture seen about the left acetabulum or within the proximal left femur. 3. Degenerative osteoarthritis at the right hip joint, moderate in degree. Electronically Signed   By: Franki Cabot M.D.   On: 06/08/2017 14:11    2D ECHO:   Disposition and Follow-up: Discharge Instructions    Diet - low sodium heart healthy   Complete by:  As directed    Increase activity slowly   Complete by:  As directed        DISPOSITION: snf   DISCHARGE FOLLOW-UP  Contact information for follow-up providers    Lavone Orn, MD. Schedule an appointment as soon as possible for a visit in 2 week(s).   Specialty:  Internal Medicine Contact information: [address] [place] [address] [phone]        Garald Balding, MD. Schedule an appointment as soon as possible for a visit in 2 week(s).   Specialty:  Orthopedic Surgery Contact information:  [place] [address] [phone]            Contact information for after-discharge care    Destination    [facility] .   Service:  Skilled Nursing Contact information: [address] [place] [phone]                   Time spent on Discharge: 2mins   Signed:   Estill Cotta M.D. [facility] 06/12/2017, 11:40 AM Pager: [phone]

## 2017-06-12 NOTE — Progress Notes (Signed)
Report called to [facility] at [facility]. All questions/concerns addressed. IV removed, catheter tip intact. VSS. Transported via [facility] to SNF. Family to take belongings to [facility].

## 2017-06-12 NOTE — Clinical Social Work Placement (Signed)
Nurse to call report to [phone], Room 611B  Transport to be set up after lunch.     CLINICAL SOCIAL WORK PLACEMENT  NOTE  Date:  06/12/2017  Patient Details  Name: Gina Bowers MRN: [id number] Date of Birth: 1926-04-30  Clinical Social Work is seeking post-discharge placement for this patient at the [place] level of care (*CSW will initial, date and re-position this form in  chart as items are completed):  Yes   Patient/family provided with [place] Work Department's list of facilities offering this level of care within the geographic area requested by the patient (or if unable, by the patient's family).  Yes   Patient/family informed of their freedom to choose among providers that offer the needed level of care, that participate in Medicare, Medicaid or managed care program needed by the patient, have an available bed and are willing to accept the patient.  Yes   Patient/family informed of [facility]'s ownership interest in [facility] and [facility], as well as of the fact that they are under no obligation to receive care at these facilities.  PASRR submitted to EDS on 06/10/17     PASRR number received on 06/10/17     Existing PASRR number confirmed on       FL2 transmitted to all facilities in geographic area requested by pt/family on 06/10/17     FL2 transmitted to all facilities within larger geographic area on       Patient informed that his/her managed care company has contracts with or will negotiate with certain facilities, including the following:        Yes   Patient/family informed of bed offers received.  Patient chooses bed at [facility]     Physician recommends and patient chooses bed at      Patient to be transferred to [facility] on 06/12/17.  Patient to be transferred to facility by [facility]     Patient family notified on 06/12/17 of transfer.  Name of family member notified:  [name]     PHYSICIAN      Additional Comment:    _______________________________________________ Geralynn Ochs, LCSW 06/12/2017, 11:52 AM

## 2017-06-14 DIAGNOSIS — A4152 Sepsis due to Pseudomonas: Secondary | ICD-10-CM | POA: Diagnosis not present

## 2017-06-14 DIAGNOSIS — R0600 Dyspnea, unspecified: Secondary | ICD-10-CM | POA: Diagnosis not present

## 2017-06-14 DIAGNOSIS — R52 Pain, unspecified: Secondary | ICD-10-CM | POA: Diagnosis not present

## 2017-06-14 DIAGNOSIS — M810 Age-related osteoporosis without current pathological fracture: Secondary | ICD-10-CM | POA: Diagnosis not present

## 2017-06-14 DIAGNOSIS — N390 Urinary tract infection, site not specified: Secondary | ICD-10-CM | POA: Diagnosis not present

## 2017-06-14 DIAGNOSIS — E039 Hypothyroidism, unspecified: Secondary | ICD-10-CM | POA: Diagnosis not present

## 2017-06-14 DIAGNOSIS — H8149 Vertigo of central origin, unspecified ear: Secondary | ICD-10-CM | POA: Diagnosis not present

## 2017-06-14 DIAGNOSIS — I10 Essential (primary) hypertension: Secondary | ICD-10-CM | POA: Diagnosis not present

## 2017-06-14 DIAGNOSIS — S32502S Unspecified fracture of left pubis, sequela: Secondary | ICD-10-CM | POA: Diagnosis not present

## 2017-06-14 DIAGNOSIS — M6281 Muscle weakness (generalized): Secondary | ICD-10-CM | POA: Diagnosis not present

## 2017-06-14 DIAGNOSIS — I2510 Atherosclerotic heart disease of native coronary artery without angina pectoris: Secondary | ICD-10-CM | POA: Diagnosis not present

## 2017-06-14 DIAGNOSIS — E569 Vitamin deficiency, unspecified: Secondary | ICD-10-CM | POA: Diagnosis not present

## 2017-06-16 DIAGNOSIS — I10 Essential (primary) hypertension: Secondary | ICD-10-CM | POA: Diagnosis not present

## 2017-06-16 DIAGNOSIS — N390 Urinary tract infection, site not specified: Secondary | ICD-10-CM | POA: Diagnosis not present

## 2017-06-16 DIAGNOSIS — A4152 Sepsis due to Pseudomonas: Secondary | ICD-10-CM | POA: Diagnosis not present

## 2017-06-16 DIAGNOSIS — I2510 Atherosclerotic heart disease of native coronary artery without angina pectoris: Secondary | ICD-10-CM | POA: Diagnosis not present

## 2017-06-16 DIAGNOSIS — M6281 Muscle weakness (generalized): Secondary | ICD-10-CM | POA: Diagnosis not present

## 2017-06-16 DIAGNOSIS — M810 Age-related osteoporosis without current pathological fracture: Secondary | ICD-10-CM | POA: Diagnosis not present

## 2017-06-16 DIAGNOSIS — H8149 Vertigo of central origin, unspecified ear: Secondary | ICD-10-CM | POA: Diagnosis not present

## 2017-06-16 DIAGNOSIS — E039 Hypothyroidism, unspecified: Secondary | ICD-10-CM | POA: Diagnosis not present

## 2017-06-16 DIAGNOSIS — R0600 Dyspnea, unspecified: Secondary | ICD-10-CM | POA: Diagnosis not present

## 2017-06-16 DIAGNOSIS — S32502S Unspecified fracture of left pubis, sequela: Secondary | ICD-10-CM | POA: Diagnosis not present

## 2017-06-16 DIAGNOSIS — E569 Vitamin deficiency, unspecified: Secondary | ICD-10-CM | POA: Diagnosis not present

## 2017-06-16 DIAGNOSIS — R52 Pain, unspecified: Secondary | ICD-10-CM | POA: Diagnosis not present

## 2017-06-18 DIAGNOSIS — A4152 Sepsis due to Pseudomonas: Secondary | ICD-10-CM | POA: Diagnosis not present

## 2017-06-18 DIAGNOSIS — I10 Essential (primary) hypertension: Secondary | ICD-10-CM | POA: Diagnosis not present

## 2017-06-18 DIAGNOSIS — I2510 Atherosclerotic heart disease of native coronary artery without angina pectoris: Secondary | ICD-10-CM | POA: Diagnosis not present

## 2017-06-18 DIAGNOSIS — E039 Hypothyroidism, unspecified: Secondary | ICD-10-CM | POA: Diagnosis not present

## 2017-06-18 DIAGNOSIS — M6281 Muscle weakness (generalized): Secondary | ICD-10-CM | POA: Diagnosis not present

## 2017-06-18 DIAGNOSIS — H8149 Vertigo of central origin, unspecified ear: Secondary | ICD-10-CM | POA: Diagnosis not present

## 2017-06-18 DIAGNOSIS — R0600 Dyspnea, unspecified: Secondary | ICD-10-CM | POA: Diagnosis not present

## 2017-06-18 DIAGNOSIS — K5900 Constipation, unspecified: Secondary | ICD-10-CM | POA: Diagnosis not present

## 2017-06-18 DIAGNOSIS — N390 Urinary tract infection, site not specified: Secondary | ICD-10-CM | POA: Diagnosis not present

## 2017-06-18 DIAGNOSIS — S32502S Unspecified fracture of left pubis, sequela: Secondary | ICD-10-CM | POA: Diagnosis not present

## 2017-06-18 DIAGNOSIS — M810 Age-related osteoporosis without current pathological fracture: Secondary | ICD-10-CM | POA: Diagnosis not present

## 2017-06-18 DIAGNOSIS — R52 Pain, unspecified: Secondary | ICD-10-CM | POA: Diagnosis not present

## 2017-06-21 DIAGNOSIS — N390 Urinary tract infection, site not specified: Secondary | ICD-10-CM | POA: Diagnosis not present

## 2017-06-21 DIAGNOSIS — S32502S Unspecified fracture of left pubis, sequela: Secondary | ICD-10-CM | POA: Diagnosis not present

## 2017-06-21 DIAGNOSIS — M810 Age-related osteoporosis without current pathological fracture: Secondary | ICD-10-CM | POA: Diagnosis not present

## 2017-06-21 DIAGNOSIS — H8149 Vertigo of central origin, unspecified ear: Secondary | ICD-10-CM | POA: Diagnosis not present

## 2017-06-21 DIAGNOSIS — K5900 Constipation, unspecified: Secondary | ICD-10-CM | POA: Diagnosis not present

## 2017-06-21 DIAGNOSIS — M6281 Muscle weakness (generalized): Secondary | ICD-10-CM | POA: Diagnosis not present

## 2017-06-21 DIAGNOSIS — A4152 Sepsis due to Pseudomonas: Secondary | ICD-10-CM | POA: Diagnosis not present

## 2017-06-21 DIAGNOSIS — I10 Essential (primary) hypertension: Secondary | ICD-10-CM | POA: Diagnosis not present

## 2017-06-21 DIAGNOSIS — I2510 Atherosclerotic heart disease of native coronary artery without angina pectoris: Secondary | ICD-10-CM | POA: Diagnosis not present

## 2017-06-21 DIAGNOSIS — R52 Pain, unspecified: Secondary | ICD-10-CM | POA: Diagnosis not present

## 2017-06-21 DIAGNOSIS — R0600 Dyspnea, unspecified: Secondary | ICD-10-CM | POA: Diagnosis not present

## 2017-06-21 DIAGNOSIS — E039 Hypothyroidism, unspecified: Secondary | ICD-10-CM | POA: Diagnosis not present

## 2017-06-22 DIAGNOSIS — S32502S Unspecified fracture of left pubis, sequela: Secondary | ICD-10-CM | POA: Diagnosis not present

## 2017-06-22 DIAGNOSIS — M6281 Muscle weakness (generalized): Secondary | ICD-10-CM | POA: Diagnosis not present

## 2017-06-22 DIAGNOSIS — R52 Pain, unspecified: Secondary | ICD-10-CM | POA: Diagnosis not present

## 2017-06-28 ENCOUNTER — Other Ambulatory Visit: Payer: Self-pay | Admitting: Cardiovascular Disease

## 2017-06-28 DIAGNOSIS — S32502S Unspecified fracture of left pubis, sequela: Secondary | ICD-10-CM | POA: Diagnosis not present

## 2017-06-28 DIAGNOSIS — R52 Pain, unspecified: Secondary | ICD-10-CM | POA: Diagnosis not present

## 2017-06-28 DIAGNOSIS — R0600 Dyspnea, unspecified: Secondary | ICD-10-CM | POA: Diagnosis not present

## 2017-06-28 DIAGNOSIS — K5900 Constipation, unspecified: Secondary | ICD-10-CM | POA: Diagnosis not present

## 2017-06-28 DIAGNOSIS — E039 Hypothyroidism, unspecified: Secondary | ICD-10-CM | POA: Diagnosis not present

## 2017-06-28 DIAGNOSIS — M6281 Muscle weakness (generalized): Secondary | ICD-10-CM | POA: Diagnosis not present

## 2017-06-28 DIAGNOSIS — H8149 Vertigo of central origin, unspecified ear: Secondary | ICD-10-CM | POA: Diagnosis not present

## 2017-06-28 DIAGNOSIS — I10 Essential (primary) hypertension: Secondary | ICD-10-CM | POA: Diagnosis not present

## 2017-06-28 DIAGNOSIS — N390 Urinary tract infection, site not specified: Secondary | ICD-10-CM | POA: Diagnosis not present

## 2017-06-28 DIAGNOSIS — A4152 Sepsis due to Pseudomonas: Secondary | ICD-10-CM | POA: Diagnosis not present

## 2017-06-28 DIAGNOSIS — I2510 Atherosclerotic heart disease of native coronary artery without angina pectoris: Secondary | ICD-10-CM | POA: Diagnosis not present

## 2017-06-28 DIAGNOSIS — M810 Age-related osteoporosis without current pathological fracture: Secondary | ICD-10-CM | POA: Diagnosis not present

## 2017-06-28 NOTE — Telephone Encounter (Signed)
Rx(s) sent to pharmacy electronically.  

## 2017-07-02 ENCOUNTER — Encounter (INDEPENDENT_AMBULATORY_CARE_PROVIDER_SITE_OTHER): Payer: Self-pay | Admitting: Orthopaedic Surgery

## 2017-07-02 ENCOUNTER — Ambulatory Visit (INDEPENDENT_AMBULATORY_CARE_PROVIDER_SITE_OTHER): Payer: Medicare Other | Admitting: Orthopaedic Surgery

## 2017-07-02 ENCOUNTER — Ambulatory Visit (INDEPENDENT_AMBULATORY_CARE_PROVIDER_SITE_OTHER): Payer: No Typology Code available for payment source

## 2017-07-02 VITALS — BP 132/83 | HR 60 | Resp 16 | Ht 62.0 in | Wt 160.0 lb

## 2017-07-02 DIAGNOSIS — M25552 Pain in left hip: Secondary | ICD-10-CM

## 2017-07-02 DIAGNOSIS — R634 Abnormal weight loss: Secondary | ICD-10-CM | POA: Diagnosis not present

## 2017-07-02 NOTE — Progress Notes (Signed)
Office Visit Note   Patient: Gina Bowers           Date of Birth: 1925-09-01           MRN: [id number] Visit Date: 07/02/2017              Requested by: Lavone Orn, MD [address] [place], [place] [address] PCP: Lavone Orn, MD   Assessment & Plan: Visit Diagnoses:  1. Pain in left hip     Plan: Gina Bowers was diagnosed with a nondisplaced left acetabular fracture by CT scan after a fall about a month ago. She also had fractures of the inferior and superior pubic rami on the same side. She's been nonweightbearing at [facility] rehabilitation since. No significant pain. Films today reveal nice position of the fractures with healing. We'll let her begin weightbearing as tolerated and have her return in 1 month. She has a lot of side issues including issue with her balance and possible peripheral neuropathy. Long discussion with family  Follow-Up Instructions: Return in about 1 month (around 07/30/2017).   Orders:  Orders Placed This Encounter  Procedures  . XR HIP UNILAT W OR W/O PELVIS 2-3 VIEWS LEFT   No orders of the defined types were placed in this encounter.     Procedures: No procedures performed   Clinical Data: No additional findings.   Subjective: Chief Complaint  Patient presents with  . Left Hip - Follow-up  . Hip Pain    Left hip replacement 2005?, fell 06/08/17, follow up, PT muscle strain, pain in thigh x 7 days, not diabetic, no improvement,  lives at [facility], numbness in bil feet  Seen as a consult in the hospital about 3-4 weeks ago. Gina Bowers had fallen and sustained fractures of the inferior and superior pubic rami and a nondisplaced fracture of the left acetabulum behind a total hip replacement noted by CT scan. Has been nonweightbearing since that time and having much less pain. She presently is at [facility] facility. She is accompanied by family today. Would suggest having her primary care physician evaluate  possible neuropathy. I don't think this is referred from her back  HPI  Review of Systems  Constitutional: Positive for activity change and fatigue.  HENT: Negative for trouble swallowing.   Eyes: Negative for pain.  Respiratory: Negative for shortness of breath.   Cardiovascular: Negative for leg swelling.  Gastrointestinal: Positive for constipation.  Endocrine: Negative for cold intolerance.  Genitourinary: Positive for difficulty urinating.  Musculoskeletal: Positive for gait problem.  Skin: Negative for rash.  Allergic/Immunologic: Negative for food allergies.  Neurological: Positive for weakness and numbness.  Psychiatric/Behavioral: Positive for sleep disturbance.     Objective: Vital Signs: BP 132/83 (BP Location: Left Arm, Patient Position: Sitting, Cuff Size: Normal)   Pulse 60   Resp 16   Ht 5\' 2"  (1.575 m)   Wt 160 lb (72.6 kg)   BMI 29.26 kg/m   Physical Exam  Ortho Exam awake alert and oriented 3. Comfortable sitting. Is able to actively flex and extend left hip with minimal discomfort. No pain with internal/external rotation. No distal edema. Does have some altered sensibility in both of her feet that appears to be consistent clinically with neuropathy. Does not have any particular thigh or leg pain  Specialty Comments:  No specialty comments available.  Imaging: Xr Hip Unilat W Or W/o Pelvis 2-3 Views Left  Result Date: 07/02/2017 AP the pelvis reveals  the left total hip replacement be in place. Previously diagnosed fracture of the acetabulum is not visualized. There is some ectopic calcification or callus formation about the pubic rami fractures consistent with healing.    PMFS History: Patient Active Problem List   Diagnosis Date Noted  . Pubic ramus fracture ([place]) 06/08/2017  . Acetabulum fracture ([place]) 06/08/2017  . Fall at home, initial encounter 06/08/2017  . UTI (urinary tract infection) 06/10/2016  . Acute pyelonephritis   . SIRS (systemic  inflammatory response syndrome) (HCC)   . Epistaxis 03/09/2016  . Ocular migraine 11/04/2015  . Essential hypertension 11/04/2015  . HLD (hyperlipidemia) 11/04/2015  . Coronary artery disease involving coronary bypass graft of native heart 11/04/2015  . Fatigue 10/08/2015  . Urinary tract infection 10/05/2015  . Chest pain 10/04/2015  . Hypertension 10/04/2015  . Unstable angina ([place])   . Hypertensive heart disease without heart failure   . S/P CABG 2009-cath 10/07/15-medical Rx   . S/P coronary artery stent placement-2012   . Acute CVA (cerebrovascular accident) ([place]) 08/17/2015  . Possible Renovascular hypertension 07/25/2015  . Hyperlipidemia 07/25/2015  . History of stroke-March 2017 (rt brain) 07/25/2015  . Vertebrobasilar artery stenosis 07/25/2015  . CAD (coronary artery disease) of artery bypass graft 06/06/2014  . Diastolic dysfunction-grade 1 with EF 60% 06/06/2014  . CVA (cerebral vascular accident) ([place]) 11/17/2011  . TIA post cath 10/07/15 11/13/2011  . Weakness 11/13/2011  . Obstructive sleep apnea 11/23/2008  . PULMONARY NODULE 11/23/2008  . Hypoxemia 11/23/2008  . Hypothyroidism 11/22/2008  . Anxiety state 11/22/2008  . Cerebral artery occlusion with cerebral infarction ([place]) 11/22/2008   Past Medical History:  Diagnosis Date  . Arthritis   . Blood transfusion 2007   AFTER HIP REPLACEMENT  . Carotid bruit    PT'S DAUGHTER STATES PT HAD RECENT CAROTID STUDY AND WAS TOLD NO SIGNIFICANT BLOCKAGES  . Chronic diastolic CHF (congestive heart failure) ([place])    a. 07/2015 Ech: Ef 60-65%, Gr1 DD.  Marland Kitchen Coronary artery disease    a. 2009 CABG 4, Dr. Servando Snare (LIMA to LAD , SVG to diagonal, SVG to circumflex, SVG to PDA); b. Cath 2012 in  Recent ill,[place] patent LIMA , Patent SVG to diagonal, occluded SVG to RCA. 5x5mm BMS->native RCA; c. 09/2015 Cath: LM nl, LAD 172m, D1 100, LCX small, OM1 90/small, OM2 70, RCA 45p, patent stent, VG->dRCA 100, VG->OM1 100, VG->Diag nl,  LIMA->LAD nl->Med Rx.  . Depression   . Fracture FEB 2013   FRACTURED LEFT ANKLE--NO SURGERY--WAS IN BOOT UNTIL COUPLE WEEKS AGO  . GERD (gastroesophageal reflux disease)   . High cholesterol   . Hypothyroidism   . Ischemic colitis ([place]) 2010  . Kidney stones    IN THE PAST  . 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 [place]walk virus    COUPLE OF WEEKS AGO--ALL SYMTOMS RESOLVED PER PT  . Obstructive sleep apnea 2010  . Shortness of breath    AND WHEEZING AT TIMES--NO INHALERS  . Stroke ([place])    SEVERAL STROKES -TOLD SHE HAS CEREBELLUM BLOCKAGE AS RESULT OF STROKE--BUT NEUROLOGIST SAID HE DID NOT WANT TO DO ANY PROCEDURE BECAUSE OF HER AGE .  PT HAS SLIGHT LEFT FOOT DROP-DRAGS FOOT WHEN WALKING - USES WALKER  . Urinary incontinence   . UTI (lower urinary tract infection)    HX OF UTI'S-- LAST TIME COUPLE OF MONTHS AGO  . Vertigo     Family History  Problem Relation Age  of Onset  . Heart disease Mother     Past Surgical History:  Procedure Laterality Date  . ABDOMINAL HYSTERECTOMY    . APPENDECTOMY    . [place] AND 1990  . CARDIAC CATHETERIZATION N/A 10/07/2015   Procedure: Left Heart Cath and Cors/Grafts Angiography;  Surgeon: Jake Fuhrmann M Martinique, MD;  Location: [facility];  Service: Cardiovascular;  Laterality: N/A;  . CATARACT EXTRACTION    . CORONARY ANGIOPLASTY  10/2010   WITH STENT PLACEMENT  . CORONARY ARTERY BYPASS GRAFT  2009  . CYSTOSCOPY WITH INJECTION  09/21/2011   Procedure: CYSTOSCOPY WITH INJECTION;  Surgeon: Ailene Rud, MD;  Location: [facility];  Service: Urology;  Laterality: N/A;  Peri Urethral Macroplastique Injection and Estring Placement  . HERNIA REPAIR    . HIP SURGERY    . JOINT REPLACEMENT  2007   HIP REPLACEMENT -LEFT  . TONSILLECTOMY     Social History   Occupational History  . Not on file  Tobacco Use  . Smoking status: Never Smoker  . Smokeless tobacco: Never Used  Substance and Sexual Activity  .  Alcohol use: No  . Drug use: No  . Sexual activity: Not on file

## 2017-07-05 DIAGNOSIS — L02211 Cutaneous abscess of abdominal wall: Secondary | ICD-10-CM | POA: Diagnosis not present

## 2017-07-14 DIAGNOSIS — R350 Frequency of micturition: Secondary | ICD-10-CM | POA: Diagnosis not present

## 2017-07-14 DIAGNOSIS — N390 Urinary tract infection, site not specified: Secondary | ICD-10-CM | POA: Diagnosis not present

## 2017-07-15 DIAGNOSIS — M6281 Muscle weakness (generalized): Secondary | ICD-10-CM | POA: Diagnosis not present

## 2017-07-15 DIAGNOSIS — S32502S Unspecified fracture of left pubis, sequela: Secondary | ICD-10-CM | POA: Diagnosis not present

## 2017-07-15 DIAGNOSIS — R52 Pain, unspecified: Secondary | ICD-10-CM | POA: Diagnosis not present

## 2017-07-19 DIAGNOSIS — N390 Urinary tract infection, site not specified: Secondary | ICD-10-CM | POA: Diagnosis not present

## 2017-07-22 DIAGNOSIS — M810 Age-related osteoporosis without current pathological fracture: Secondary | ICD-10-CM | POA: Diagnosis not present

## 2017-07-22 DIAGNOSIS — M6281 Muscle weakness (generalized): Secondary | ICD-10-CM | POA: Diagnosis not present

## 2017-07-22 DIAGNOSIS — A4152 Sepsis due to Pseudomonas: Secondary | ICD-10-CM | POA: Diagnosis not present

## 2017-07-22 DIAGNOSIS — K5900 Constipation, unspecified: Secondary | ICD-10-CM | POA: Diagnosis not present

## 2017-07-22 DIAGNOSIS — I10 Essential (primary) hypertension: Secondary | ICD-10-CM | POA: Diagnosis not present

## 2017-07-22 DIAGNOSIS — E039 Hypothyroidism, unspecified: Secondary | ICD-10-CM | POA: Diagnosis not present

## 2017-07-22 DIAGNOSIS — S32502S Unspecified fracture of left pubis, sequela: Secondary | ICD-10-CM | POA: Diagnosis not present

## 2017-07-22 DIAGNOSIS — I2510 Atherosclerotic heart disease of native coronary artery without angina pectoris: Secondary | ICD-10-CM | POA: Diagnosis not present

## 2017-07-22 DIAGNOSIS — R0600 Dyspnea, unspecified: Secondary | ICD-10-CM | POA: Diagnosis not present

## 2017-07-22 DIAGNOSIS — H8149 Vertigo of central origin, unspecified ear: Secondary | ICD-10-CM | POA: Diagnosis not present

## 2017-07-22 DIAGNOSIS — N390 Urinary tract infection, site not specified: Secondary | ICD-10-CM | POA: Diagnosis not present

## 2017-07-22 DIAGNOSIS — R52 Pain, unspecified: Secondary | ICD-10-CM | POA: Diagnosis not present

## 2017-07-23 DIAGNOSIS — K219 Gastro-esophageal reflux disease without esophagitis: Secondary | ICD-10-CM | POA: Diagnosis not present

## 2017-07-23 DIAGNOSIS — F322 Major depressive disorder, single episode, severe without psychotic features: Secondary | ICD-10-CM | POA: Diagnosis not present

## 2017-07-23 DIAGNOSIS — R2689 Other abnormalities of gait and mobility: Secondary | ICD-10-CM | POA: Diagnosis not present

## 2017-07-23 DIAGNOSIS — S329XXA Fracture of unspecified parts of lumbosacral spine and pelvis, initial encounter for closed fracture: Secondary | ICD-10-CM | POA: Diagnosis not present

## 2017-07-23 DIAGNOSIS — I10 Essential (primary) hypertension: Secondary | ICD-10-CM | POA: Diagnosis not present

## 2017-07-23 DIAGNOSIS — F411 Generalized anxiety disorder: Secondary | ICD-10-CM | POA: Diagnosis not present

## 2017-07-23 DIAGNOSIS — M810 Age-related osteoporosis without current pathological fracture: Secondary | ICD-10-CM | POA: Diagnosis not present

## 2017-07-23 DIAGNOSIS — I2510 Atherosclerotic heart disease of native coronary artery without angina pectoris: Secondary | ICD-10-CM | POA: Diagnosis not present

## 2017-07-24 DIAGNOSIS — I10 Essential (primary) hypertension: Secondary | ICD-10-CM | POA: Diagnosis not present

## 2017-07-25 DIAGNOSIS — M25551 Pain in right hip: Secondary | ICD-10-CM | POA: Diagnosis not present

## 2017-07-26 DIAGNOSIS — R296 Repeated falls: Secondary | ICD-10-CM | POA: Diagnosis not present

## 2017-07-26 DIAGNOSIS — S32402D Unspecified fracture of left acetabulum, subsequent encounter for fracture with routine healing: Secondary | ICD-10-CM | POA: Diagnosis not present

## 2017-07-26 DIAGNOSIS — I2510 Atherosclerotic heart disease of native coronary artery without angina pectoris: Secondary | ICD-10-CM | POA: Diagnosis not present

## 2017-07-26 DIAGNOSIS — S32592D Other specified fracture of left pubis, subsequent encounter for fracture with routine healing: Secondary | ICD-10-CM | POA: Diagnosis not present

## 2017-07-30 ENCOUNTER — Ambulatory Visit (INDEPENDENT_AMBULATORY_CARE_PROVIDER_SITE_OTHER): Payer: Medicare Other | Admitting: Orthopaedic Surgery

## 2017-07-30 ENCOUNTER — Encounter (INDEPENDENT_AMBULATORY_CARE_PROVIDER_SITE_OTHER): Payer: Self-pay | Admitting: Orthopaedic Surgery

## 2017-07-30 VITALS — BP 138/70 | HR 65 | Resp 16 | Ht 61.0 in | Wt 169.0 lb

## 2017-07-30 DIAGNOSIS — M25552 Pain in left hip: Secondary | ICD-10-CM

## 2017-07-30 NOTE — Progress Notes (Signed)
Office Visit Note   Patient: Gina Bowers           Date of Birth: [birth year]           MRN: [id number] Visit Date: 07/30/2017              Requested by: Lavone Orn, MD [address] [place], [place] [address] PCP: Lavone Orn, MD   Assessment & Plan: Visit Diagnoses:  1. Pain of left hip joint     Plan: Healing fractures of left acetabulum and inferior and superior pubic rami. Family accompanies Gina Bowers. Her progress has been positive. She is up walking with a walker and really not having any pain. X-rays not necessary today. I think she's doing fine. We'll plan to see her back as needed. She is weak and does have an issue with her balance which is going to prolong her rehabilitation  Follow-Up Instructions: Return if symptoms worsen or fail to improve.   Orders:  No orders of the defined types were placed in this encounter.  No orders of the defined types were placed in this encounter.     Procedures: No procedures performed   Clinical Data: No additional findings.   Subjective: Chief Complaint  Patient presents with  . Left Hip - Follow-up    Gina Bowers IS 84 YO F HERE TO F/U AFTER FALL ON LEFT HIP, STILL HAVING PAIN. IN THEARPY STATER TO BEAR WEIGHT W WALKER.. PT HAS FALLEN OUT OF CHAIR THIS PAST Sunday ONTO RIGHT HIP BUT HAVINGNI ISSUES WITH THAT HIP.  2 months status post fall in which she sustained nondisplaced fractures of the left acetabulum diagnosed with CT scan and inferior and superior pubic rami fractures are doing well. He is in assisted living. Receiving physical therapy and walking with a walker. Not expressing any significant pain  HPI  Review of Systems  Constitutional: Negative for fatigue and fever.  HENT: Negative for ear pain.   Eyes: Negative for pain.  Respiratory: Negative for shortness of breath.   Cardiovascular: Negative for chest pain and leg swelling.  Gastrointestinal: Positive for constipation. Negative for  blood in stool and diarrhea.  Genitourinary: Positive for difficulty urinating.  Musculoskeletal: Negative for back pain and neck pain.  Skin: Negative for rash.  Allergic/Immunologic: Negative for food allergies.  Neurological: Negative for dizziness, weakness, light-headedness, numbness and headaches.  Hematological: Bruises/bleeds easily.  Psychiatric/Behavioral: Negative for sleep disturbance.     Objective: Vital Signs: BP 138/70 (BP Location: Left Arm, Patient Position: Sitting, Cuff Size: Normal)   Pulse 65   Resp 16   Ht 5\' 1"  (1.549 m)   Wt 169 lb (76.7 kg)   BMI 31.93 kg/m   Physical Exam  Ortho Exam awake alert and oriented 3. Comfortable sitting. Able to actively flex both hips particularly the left without any pain. No thigh pain or calf pain. Neurologically appears to be intact i.e. motors intact. No pain with internal/external rotation of left hip. No palpable areas of tenderness. No distal edema.  Specialty Comments:  No specialty comments available.  Imaging: No results found.   PMFS History: Patient Active Problem List   Diagnosis Date Noted  . Pubic ramus fracture ([place]) 06/08/2017  . Acetabulum fracture ([place]) 06/08/2017  . Fall at home, initial encounter 06/08/2017  . UTI (urinary tract infection) 06/10/2016  . Acute pyelonephritis   . SIRS (systemic inflammatory response syndrome) (HCC)   . Epistaxis 03/09/2016  . Ocular migraine 11/04/2015  .  Essential hypertension 11/04/2015  . HLD (hyperlipidemia) 11/04/2015  . Coronary artery disease involving coronary bypass graft of native heart 11/04/2015  . Fatigue 10/08/2015  . Urinary tract infection 10/05/2015  . Chest pain 10/04/2015  . Hypertension 10/04/2015  . Unstable angina ([place])   . Hypertensive heart disease without heart failure   . S/P CABG 2009-cath 10/07/15-medical Rx   . S/P coronary artery stent placement-2012   . Acute CVA (cerebrovascular accident) ([place]) 08/17/2015  . Possible  Renovascular hypertension 07/25/2015  . Hyperlipidemia 07/25/2015  . History of stroke-March 2017 (rt brain) 07/25/2015  . Vertebrobasilar artery stenosis 07/25/2015  . CAD (coronary artery disease) of artery bypass graft 06/06/2014  . Diastolic dysfunction-grade 1 with EF 60% 06/06/2014  . CVA (cerebral vascular accident) ([place]) 11/17/2011  . TIA post cath 10/07/15 11/13/2011  . Weakness 11/13/2011  . Obstructive sleep apnea 11/23/2008  . PULMONARY NODULE 11/23/2008  . Hypoxemia 11/23/2008  . Hypothyroidism 11/22/2008  . Anxiety state 11/22/2008  . Cerebral artery occlusion with cerebral infarction ([place]) 11/22/2008   Past Medical History:  Diagnosis Date  . Arthritis   . Blood transfusion 2007   AFTER HIP REPLACEMENT  . Carotid bruit    PT'S DAUGHTER STATES PT HAD RECENT CAROTID STUDY AND WAS TOLD NO SIGNIFICANT BLOCKAGES  . Chronic diastolic CHF (congestive heart failure) ([place])    a. 07/2015 Ech: Ef 60-65%, Gr1 DD.  Marland Kitchen Coronary artery disease    a. 2009 CABG 4, Dr. Servando Snare (LIMA to LAD , SVG to diagonal, SVG to circumflex, SVG to PDA); b. Cath 2012 in  Recent ill,[place] patent LIMA , Patent SVG to diagonal, occluded SVG to RCA. 5x39mm BMS->native RCA; c. 09/2015 Cath: LM nl, LAD 115m, D1 100, LCX small, OM1 90/small, OM2 70, RCA 45p, patent stent, VG->dRCA 100, VG->OM1 100, VG->Diag nl, LIMA->LAD nl->Med Rx.  . Depression   . Fracture FEB 2013   FRACTURED LEFT ANKLE--NO SURGERY--WAS IN BOOT UNTIL COUPLE WEEKS AGO  . GERD (gastroesophageal reflux disease)   . High cholesterol   . Hypothyroidism   . Ischemic colitis ([place]) 2010  . Kidney stones    IN THE PAST  . 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 [place] virus    COUPLE OF WEEKS AGO--ALL SYMTOMS RESOLVED PER PT  . Obstructive sleep apnea 2010  . Shortness of breath    AND WHEEZING AT TIMES--NO INHALERS  . Stroke ([place])    SEVERAL STROKES -TOLD SHE HAS CEREBELLUM BLOCKAGE AS RESULT OF STROKE--BUT  NEUROLOGIST SAID HE DID NOT WANT TO DO ANY PROCEDURE BECAUSE OF HER AGE .  PT HAS SLIGHT LEFT FOOT DROP-DRAGS FOOT WHEN WALKING - USES WALKER  . Urinary incontinence   . UTI (lower urinary tract infection)    HX OF UTI'S-- LAST TIME COUPLE OF MONTHS AGO  . Vertigo     Family History  Problem Relation Age of Onset  . Heart disease Mother     Past Surgical History:  Procedure Laterality Date  . ABDOMINAL HYSTERECTOMY    . APPENDECTOMY    . [place] AND 1990  . CARDIAC CATHETERIZATION N/A 10/07/2015   Procedure: Left Heart Cath and Cors/Grafts Angiography;  Surgeon: Yaslin Kirtley M Martinique, MD;  Location: [facility];  Service: Cardiovascular;  Laterality: N/A;  . CATARACT EXTRACTION    . CORONARY ANGIOPLASTY  10/2010   WITH STENT PLACEMENT  . CORONARY ARTERY BYPASS GRAFT  2009  .  CYSTOSCOPY WITH INJECTION  09/21/2011   Procedure: CYSTOSCOPY WITH INJECTION;  Surgeon: Ailene Rud, MD;  Location: [facility];  Service: Urology;  Laterality: N/A;  Peri Urethral Macroplastique Injection and Estring Placement  . HERNIA REPAIR    . HIP SURGERY    . JOINT REPLACEMENT  2007   HIP REPLACEMENT -LEFT  . TONSILLECTOMY     Social History   Occupational History  . Not on file  Tobacco Use  . Smoking status: Never Smoker  . Smokeless tobacco: Never Used  Substance and Sexual Activity  . Alcohol use: No  . Drug use: No  . Sexual activity: Not on file

## 2017-08-03 DIAGNOSIS — S32402D Unspecified fracture of left acetabulum, subsequent encounter for fracture with routine healing: Secondary | ICD-10-CM | POA: Diagnosis not present

## 2017-08-03 DIAGNOSIS — I2510 Atherosclerotic heart disease of native coronary artery without angina pectoris: Secondary | ICD-10-CM | POA: Diagnosis not present

## 2017-08-03 DIAGNOSIS — N3020 Other chronic cystitis without hematuria: Secondary | ICD-10-CM | POA: Diagnosis not present

## 2017-08-03 DIAGNOSIS — S32592D Other specified fracture of left pubis, subsequent encounter for fracture with routine healing: Secondary | ICD-10-CM | POA: Diagnosis not present

## 2017-08-04 DIAGNOSIS — Z79899 Other long term (current) drug therapy: Secondary | ICD-10-CM | POA: Diagnosis not present

## 2017-08-04 DIAGNOSIS — N390 Urinary tract infection, site not specified: Secondary | ICD-10-CM | POA: Diagnosis not present

## 2017-08-04 DIAGNOSIS — R319 Hematuria, unspecified: Secondary | ICD-10-CM | POA: Diagnosis not present

## 2017-08-06 DIAGNOSIS — S32592D Other specified fracture of left pubis, subsequent encounter for fracture with routine healing: Secondary | ICD-10-CM | POA: Diagnosis not present

## 2017-08-06 DIAGNOSIS — S32402D Unspecified fracture of left acetabulum, subsequent encounter for fracture with routine healing: Secondary | ICD-10-CM | POA: Diagnosis not present

## 2017-08-06 DIAGNOSIS — I10 Essential (primary) hypertension: Secondary | ICD-10-CM | POA: Diagnosis not present

## 2017-08-06 DIAGNOSIS — L03031 Cellulitis of right toe: Secondary | ICD-10-CM | POA: Diagnosis not present

## 2017-08-13 DIAGNOSIS — S32402D Unspecified fracture of left acetabulum, subsequent encounter for fracture with routine healing: Secondary | ICD-10-CM | POA: Diagnosis not present

## 2017-08-13 DIAGNOSIS — I10 Essential (primary) hypertension: Secondary | ICD-10-CM | POA: Diagnosis not present

## 2017-08-13 DIAGNOSIS — L03031 Cellulitis of right toe: Secondary | ICD-10-CM | POA: Diagnosis not present

## 2017-08-13 DIAGNOSIS — I2510 Atherosclerotic heart disease of native coronary artery without angina pectoris: Secondary | ICD-10-CM | POA: Diagnosis not present

## 2017-08-18 DIAGNOSIS — R3989 Other symptoms and signs involving the genitourinary system: Secondary | ICD-10-CM | POA: Diagnosis not present

## 2017-08-26 DIAGNOSIS — R296 Repeated falls: Secondary | ICD-10-CM | POA: Diagnosis not present

## 2017-08-26 DIAGNOSIS — M6281 Muscle weakness (generalized): Secondary | ICD-10-CM | POA: Diagnosis not present

## 2017-08-26 DIAGNOSIS — R1312 Dysphagia, oropharyngeal phase: Secondary | ICD-10-CM | POA: Diagnosis not present

## 2017-08-26 DIAGNOSIS — S3282XS Multiple fractures of pelvis without disruption of pelvic ring, sequela: Secondary | ICD-10-CM | POA: Diagnosis not present

## 2017-08-26 DIAGNOSIS — R2689 Other abnormalities of gait and mobility: Secondary | ICD-10-CM | POA: Diagnosis not present

## 2017-08-27 DIAGNOSIS — N390 Urinary tract infection, site not specified: Secondary | ICD-10-CM | POA: Diagnosis not present

## 2017-08-27 DIAGNOSIS — S32592D Other specified fracture of left pubis, subsequent encounter for fracture with routine healing: Secondary | ICD-10-CM | POA: Diagnosis not present

## 2017-08-27 DIAGNOSIS — S32402D Unspecified fracture of left acetabulum, subsequent encounter for fracture with routine healing: Secondary | ICD-10-CM | POA: Diagnosis not present

## 2017-08-30 DIAGNOSIS — S3282XS Multiple fractures of pelvis without disruption of pelvic ring, sequela: Secondary | ICD-10-CM | POA: Diagnosis not present

## 2017-08-30 DIAGNOSIS — R2689 Other abnormalities of gait and mobility: Secondary | ICD-10-CM | POA: Diagnosis not present

## 2017-08-30 DIAGNOSIS — R1312 Dysphagia, oropharyngeal phase: Secondary | ICD-10-CM | POA: Diagnosis not present

## 2017-08-30 DIAGNOSIS — R296 Repeated falls: Secondary | ICD-10-CM | POA: Diagnosis not present

## 2017-08-30 DIAGNOSIS — M6281 Muscle weakness (generalized): Secondary | ICD-10-CM | POA: Diagnosis not present

## 2017-08-31 DIAGNOSIS — R296 Repeated falls: Secondary | ICD-10-CM | POA: Diagnosis not present

## 2017-08-31 DIAGNOSIS — S3282XS Multiple fractures of pelvis without disruption of pelvic ring, sequela: Secondary | ICD-10-CM | POA: Diagnosis not present

## 2017-08-31 DIAGNOSIS — M6281 Muscle weakness (generalized): Secondary | ICD-10-CM | POA: Diagnosis not present

## 2017-08-31 DIAGNOSIS — R1312 Dysphagia, oropharyngeal phase: Secondary | ICD-10-CM | POA: Diagnosis not present

## 2017-08-31 DIAGNOSIS — R2689 Other abnormalities of gait and mobility: Secondary | ICD-10-CM | POA: Diagnosis not present

## 2017-09-01 DIAGNOSIS — R296 Repeated falls: Secondary | ICD-10-CM | POA: Diagnosis not present

## 2017-09-01 DIAGNOSIS — M6281 Muscle weakness (generalized): Secondary | ICD-10-CM | POA: Diagnosis not present

## 2017-09-01 DIAGNOSIS — R2689 Other abnormalities of gait and mobility: Secondary | ICD-10-CM | POA: Diagnosis not present

## 2017-09-01 DIAGNOSIS — R1312 Dysphagia, oropharyngeal phase: Secondary | ICD-10-CM | POA: Diagnosis not present

## 2017-09-01 DIAGNOSIS — S3282XS Multiple fractures of pelvis without disruption of pelvic ring, sequela: Secondary | ICD-10-CM | POA: Diagnosis not present

## 2017-09-02 DIAGNOSIS — R2689 Other abnormalities of gait and mobility: Secondary | ICD-10-CM | POA: Diagnosis not present

## 2017-09-02 DIAGNOSIS — R1312 Dysphagia, oropharyngeal phase: Secondary | ICD-10-CM | POA: Diagnosis not present

## 2017-09-02 DIAGNOSIS — S3282XS Multiple fractures of pelvis without disruption of pelvic ring, sequela: Secondary | ICD-10-CM | POA: Diagnosis not present

## 2017-09-02 DIAGNOSIS — M6281 Muscle weakness (generalized): Secondary | ICD-10-CM | POA: Diagnosis not present

## 2017-09-02 DIAGNOSIS — R296 Repeated falls: Secondary | ICD-10-CM | POA: Diagnosis not present

## 2017-09-06 DIAGNOSIS — S3282XS Multiple fractures of pelvis without disruption of pelvic ring, sequela: Secondary | ICD-10-CM | POA: Diagnosis not present

## 2017-09-06 DIAGNOSIS — R2689 Other abnormalities of gait and mobility: Secondary | ICD-10-CM | POA: Diagnosis not present

## 2017-09-06 DIAGNOSIS — M6281 Muscle weakness (generalized): Secondary | ICD-10-CM | POA: Diagnosis not present

## 2017-09-06 DIAGNOSIS — R296 Repeated falls: Secondary | ICD-10-CM | POA: Diagnosis not present

## 2017-09-06 DIAGNOSIS — R1312 Dysphagia, oropharyngeal phase: Secondary | ICD-10-CM | POA: Diagnosis not present

## 2017-09-07 DIAGNOSIS — R2689 Other abnormalities of gait and mobility: Secondary | ICD-10-CM | POA: Diagnosis not present

## 2017-09-07 DIAGNOSIS — R1312 Dysphagia, oropharyngeal phase: Secondary | ICD-10-CM | POA: Diagnosis not present

## 2017-09-07 DIAGNOSIS — S3282XS Multiple fractures of pelvis without disruption of pelvic ring, sequela: Secondary | ICD-10-CM | POA: Diagnosis not present

## 2017-09-07 DIAGNOSIS — M6281 Muscle weakness (generalized): Secondary | ICD-10-CM | POA: Diagnosis not present

## 2017-09-07 DIAGNOSIS — R296 Repeated falls: Secondary | ICD-10-CM | POA: Diagnosis not present

## 2017-09-08 DIAGNOSIS — M6281 Muscle weakness (generalized): Secondary | ICD-10-CM | POA: Diagnosis not present

## 2017-09-08 DIAGNOSIS — S3282XS Multiple fractures of pelvis without disruption of pelvic ring, sequela: Secondary | ICD-10-CM | POA: Diagnosis not present

## 2017-09-08 DIAGNOSIS — R296 Repeated falls: Secondary | ICD-10-CM | POA: Diagnosis not present

## 2017-09-08 DIAGNOSIS — R8290 Unspecified abnormal findings in urine: Secondary | ICD-10-CM | POA: Diagnosis not present

## 2017-09-08 DIAGNOSIS — R1312 Dysphagia, oropharyngeal phase: Secondary | ICD-10-CM | POA: Diagnosis not present

## 2017-09-08 DIAGNOSIS — R2689 Other abnormalities of gait and mobility: Secondary | ICD-10-CM | POA: Diagnosis not present

## 2017-09-09 DIAGNOSIS — R2689 Other abnormalities of gait and mobility: Secondary | ICD-10-CM | POA: Diagnosis not present

## 2017-09-09 DIAGNOSIS — R296 Repeated falls: Secondary | ICD-10-CM | POA: Diagnosis not present

## 2017-09-09 DIAGNOSIS — M6281 Muscle weakness (generalized): Secondary | ICD-10-CM | POA: Diagnosis not present

## 2017-09-09 DIAGNOSIS — S3282XS Multiple fractures of pelvis without disruption of pelvic ring, sequela: Secondary | ICD-10-CM | POA: Diagnosis not present

## 2017-09-09 DIAGNOSIS — R1312 Dysphagia, oropharyngeal phase: Secondary | ICD-10-CM | POA: Diagnosis not present

## 2017-09-10 DIAGNOSIS — R296 Repeated falls: Secondary | ICD-10-CM | POA: Diagnosis not present

## 2017-09-10 DIAGNOSIS — S3282XS Multiple fractures of pelvis without disruption of pelvic ring, sequela: Secondary | ICD-10-CM | POA: Diagnosis not present

## 2017-09-10 DIAGNOSIS — M6281 Muscle weakness (generalized): Secondary | ICD-10-CM | POA: Diagnosis not present

## 2017-09-10 DIAGNOSIS — R1312 Dysphagia, oropharyngeal phase: Secondary | ICD-10-CM | POA: Diagnosis not present

## 2017-09-10 DIAGNOSIS — R2689 Other abnormalities of gait and mobility: Secondary | ICD-10-CM | POA: Diagnosis not present

## 2017-09-13 DIAGNOSIS — R296 Repeated falls: Secondary | ICD-10-CM | POA: Diagnosis not present

## 2017-09-13 DIAGNOSIS — R1312 Dysphagia, oropharyngeal phase: Secondary | ICD-10-CM | POA: Diagnosis not present

## 2017-09-13 DIAGNOSIS — R2689 Other abnormalities of gait and mobility: Secondary | ICD-10-CM | POA: Diagnosis not present

## 2017-09-13 DIAGNOSIS — M6281 Muscle weakness (generalized): Secondary | ICD-10-CM | POA: Diagnosis not present

## 2017-09-13 DIAGNOSIS — S3282XS Multiple fractures of pelvis without disruption of pelvic ring, sequela: Secondary | ICD-10-CM | POA: Diagnosis not present

## 2017-09-14 DIAGNOSIS — S3282XS Multiple fractures of pelvis without disruption of pelvic ring, sequela: Secondary | ICD-10-CM | POA: Diagnosis not present

## 2017-09-14 DIAGNOSIS — R1312 Dysphagia, oropharyngeal phase: Secondary | ICD-10-CM | POA: Diagnosis not present

## 2017-09-14 DIAGNOSIS — R2689 Other abnormalities of gait and mobility: Secondary | ICD-10-CM | POA: Diagnosis not present

## 2017-09-14 DIAGNOSIS — M6281 Muscle weakness (generalized): Secondary | ICD-10-CM | POA: Diagnosis not present

## 2017-09-14 DIAGNOSIS — R296 Repeated falls: Secondary | ICD-10-CM | POA: Diagnosis not present

## 2017-09-15 DIAGNOSIS — S3282XS Multiple fractures of pelvis without disruption of pelvic ring, sequela: Secondary | ICD-10-CM | POA: Diagnosis not present

## 2017-09-15 DIAGNOSIS — M6281 Muscle weakness (generalized): Secondary | ICD-10-CM | POA: Diagnosis not present

## 2017-09-15 DIAGNOSIS — R296 Repeated falls: Secondary | ICD-10-CM | POA: Diagnosis not present

## 2017-09-15 DIAGNOSIS — R2689 Other abnormalities of gait and mobility: Secondary | ICD-10-CM | POA: Diagnosis not present

## 2017-09-15 DIAGNOSIS — R1312 Dysphagia, oropharyngeal phase: Secondary | ICD-10-CM | POA: Diagnosis not present

## 2017-09-16 DIAGNOSIS — R296 Repeated falls: Secondary | ICD-10-CM | POA: Diagnosis not present

## 2017-09-16 DIAGNOSIS — M6281 Muscle weakness (generalized): Secondary | ICD-10-CM | POA: Diagnosis not present

## 2017-09-16 DIAGNOSIS — R1312 Dysphagia, oropharyngeal phase: Secondary | ICD-10-CM | POA: Diagnosis not present

## 2017-09-16 DIAGNOSIS — R2689 Other abnormalities of gait and mobility: Secondary | ICD-10-CM | POA: Diagnosis not present

## 2017-09-16 DIAGNOSIS — S3282XS Multiple fractures of pelvis without disruption of pelvic ring, sequela: Secondary | ICD-10-CM | POA: Diagnosis not present

## 2017-09-17 DIAGNOSIS — R1312 Dysphagia, oropharyngeal phase: Secondary | ICD-10-CM | POA: Diagnosis not present

## 2017-09-17 DIAGNOSIS — R2689 Other abnormalities of gait and mobility: Secondary | ICD-10-CM | POA: Diagnosis not present

## 2017-09-17 DIAGNOSIS — S3282XS Multiple fractures of pelvis without disruption of pelvic ring, sequela: Secondary | ICD-10-CM | POA: Diagnosis not present

## 2017-09-17 DIAGNOSIS — R296 Repeated falls: Secondary | ICD-10-CM | POA: Diagnosis not present

## 2017-09-17 DIAGNOSIS — M6281 Muscle weakness (generalized): Secondary | ICD-10-CM | POA: Diagnosis not present

## 2017-09-20 DIAGNOSIS — R296 Repeated falls: Secondary | ICD-10-CM | POA: Diagnosis not present

## 2017-09-20 DIAGNOSIS — R2689 Other abnormalities of gait and mobility: Secondary | ICD-10-CM | POA: Diagnosis not present

## 2017-09-20 DIAGNOSIS — S3282XS Multiple fractures of pelvis without disruption of pelvic ring, sequela: Secondary | ICD-10-CM | POA: Diagnosis not present

## 2017-09-20 DIAGNOSIS — M6281 Muscle weakness (generalized): Secondary | ICD-10-CM | POA: Diagnosis not present

## 2017-09-20 DIAGNOSIS — R1312 Dysphagia, oropharyngeal phase: Secondary | ICD-10-CM | POA: Diagnosis not present

## 2017-09-21 DIAGNOSIS — R1312 Dysphagia, oropharyngeal phase: Secondary | ICD-10-CM | POA: Diagnosis not present

## 2017-09-21 DIAGNOSIS — R2689 Other abnormalities of gait and mobility: Secondary | ICD-10-CM | POA: Diagnosis not present

## 2017-09-21 DIAGNOSIS — M6281 Muscle weakness (generalized): Secondary | ICD-10-CM | POA: Diagnosis not present

## 2017-09-21 DIAGNOSIS — S3282XS Multiple fractures of pelvis without disruption of pelvic ring, sequela: Secondary | ICD-10-CM | POA: Diagnosis not present

## 2017-09-21 DIAGNOSIS — R296 Repeated falls: Secondary | ICD-10-CM | POA: Diagnosis not present

## 2017-09-22 DIAGNOSIS — R296 Repeated falls: Secondary | ICD-10-CM | POA: Diagnosis not present

## 2017-09-22 DIAGNOSIS — M6281 Muscle weakness (generalized): Secondary | ICD-10-CM | POA: Diagnosis not present

## 2017-09-22 DIAGNOSIS — R2689 Other abnormalities of gait and mobility: Secondary | ICD-10-CM | POA: Diagnosis not present

## 2017-09-22 DIAGNOSIS — S3282XS Multiple fractures of pelvis without disruption of pelvic ring, sequela: Secondary | ICD-10-CM | POA: Diagnosis not present

## 2017-09-28 DIAGNOSIS — S3282XS Multiple fractures of pelvis without disruption of pelvic ring, sequela: Secondary | ICD-10-CM | POA: Diagnosis not present

## 2017-09-28 DIAGNOSIS — M6281 Muscle weakness (generalized): Secondary | ICD-10-CM | POA: Diagnosis not present

## 2017-09-28 DIAGNOSIS — R2689 Other abnormalities of gait and mobility: Secondary | ICD-10-CM | POA: Diagnosis not present

## 2017-09-28 DIAGNOSIS — R296 Repeated falls: Secondary | ICD-10-CM | POA: Diagnosis not present

## 2017-09-29 DIAGNOSIS — M6281 Muscle weakness (generalized): Secondary | ICD-10-CM | POA: Diagnosis not present

## 2017-09-29 DIAGNOSIS — R2689 Other abnormalities of gait and mobility: Secondary | ICD-10-CM | POA: Diagnosis not present

## 2017-09-29 DIAGNOSIS — S3282XS Multiple fractures of pelvis without disruption of pelvic ring, sequela: Secondary | ICD-10-CM | POA: Diagnosis not present

## 2017-09-29 DIAGNOSIS — R296 Repeated falls: Secondary | ICD-10-CM | POA: Diagnosis not present

## 2017-09-30 DIAGNOSIS — S3282XS Multiple fractures of pelvis without disruption of pelvic ring, sequela: Secondary | ICD-10-CM | POA: Diagnosis not present

## 2017-09-30 DIAGNOSIS — M6281 Muscle weakness (generalized): Secondary | ICD-10-CM | POA: Diagnosis not present

## 2017-09-30 DIAGNOSIS — R2689 Other abnormalities of gait and mobility: Secondary | ICD-10-CM | POA: Diagnosis not present

## 2017-09-30 DIAGNOSIS — R296 Repeated falls: Secondary | ICD-10-CM | POA: Diagnosis not present

## 2017-10-01 DIAGNOSIS — M6281 Muscle weakness (generalized): Secondary | ICD-10-CM | POA: Diagnosis not present

## 2017-10-01 DIAGNOSIS — S3282XS Multiple fractures of pelvis without disruption of pelvic ring, sequela: Secondary | ICD-10-CM | POA: Diagnosis not present

## 2017-10-01 DIAGNOSIS — R2689 Other abnormalities of gait and mobility: Secondary | ICD-10-CM | POA: Diagnosis not present

## 2017-10-01 DIAGNOSIS — R296 Repeated falls: Secondary | ICD-10-CM | POA: Diagnosis not present

## 2017-10-04 DIAGNOSIS — R2689 Other abnormalities of gait and mobility: Secondary | ICD-10-CM | POA: Diagnosis not present

## 2017-10-04 DIAGNOSIS — M6281 Muscle weakness (generalized): Secondary | ICD-10-CM | POA: Diagnosis not present

## 2017-10-04 DIAGNOSIS — S3282XS Multiple fractures of pelvis without disruption of pelvic ring, sequela: Secondary | ICD-10-CM | POA: Diagnosis not present

## 2017-10-04 DIAGNOSIS — R296 Repeated falls: Secondary | ICD-10-CM | POA: Diagnosis not present

## 2017-10-05 DIAGNOSIS — R296 Repeated falls: Secondary | ICD-10-CM | POA: Diagnosis not present

## 2017-10-05 DIAGNOSIS — R2689 Other abnormalities of gait and mobility: Secondary | ICD-10-CM | POA: Diagnosis not present

## 2017-10-05 DIAGNOSIS — S3282XS Multiple fractures of pelvis without disruption of pelvic ring, sequela: Secondary | ICD-10-CM | POA: Diagnosis not present

## 2017-10-05 DIAGNOSIS — M6281 Muscle weakness (generalized): Secondary | ICD-10-CM | POA: Diagnosis not present

## 2017-10-11 DIAGNOSIS — S3282XS Multiple fractures of pelvis without disruption of pelvic ring, sequela: Secondary | ICD-10-CM | POA: Diagnosis not present

## 2017-10-11 DIAGNOSIS — M6281 Muscle weakness (generalized): Secondary | ICD-10-CM | POA: Diagnosis not present

## 2017-10-11 DIAGNOSIS — R296 Repeated falls: Secondary | ICD-10-CM | POA: Diagnosis not present

## 2017-10-11 DIAGNOSIS — R2689 Other abnormalities of gait and mobility: Secondary | ICD-10-CM | POA: Diagnosis not present

## 2017-10-12 DIAGNOSIS — R2689 Other abnormalities of gait and mobility: Secondary | ICD-10-CM | POA: Diagnosis not present

## 2017-10-12 DIAGNOSIS — S3282XS Multiple fractures of pelvis without disruption of pelvic ring, sequela: Secondary | ICD-10-CM | POA: Diagnosis not present

## 2017-10-12 DIAGNOSIS — R296 Repeated falls: Secondary | ICD-10-CM | POA: Diagnosis not present

## 2017-10-12 DIAGNOSIS — M6281 Muscle weakness (generalized): Secondary | ICD-10-CM | POA: Diagnosis not present

## 2017-10-13 DIAGNOSIS — M6281 Muscle weakness (generalized): Secondary | ICD-10-CM | POA: Diagnosis not present

## 2017-10-13 DIAGNOSIS — R2689 Other abnormalities of gait and mobility: Secondary | ICD-10-CM | POA: Diagnosis not present

## 2017-10-13 DIAGNOSIS — S3282XS Multiple fractures of pelvis without disruption of pelvic ring, sequela: Secondary | ICD-10-CM | POA: Diagnosis not present

## 2017-10-13 DIAGNOSIS — R296 Repeated falls: Secondary | ICD-10-CM | POA: Diagnosis not present

## 2017-10-14 DIAGNOSIS — S3282XS Multiple fractures of pelvis without disruption of pelvic ring, sequela: Secondary | ICD-10-CM | POA: Diagnosis not present

## 2017-10-14 DIAGNOSIS — R296 Repeated falls: Secondary | ICD-10-CM | POA: Diagnosis not present

## 2017-10-14 DIAGNOSIS — R2689 Other abnormalities of gait and mobility: Secondary | ICD-10-CM | POA: Diagnosis not present

## 2017-10-14 DIAGNOSIS — M6281 Muscle weakness (generalized): Secondary | ICD-10-CM | POA: Diagnosis not present

## 2017-10-15 DIAGNOSIS — S3282XS Multiple fractures of pelvis without disruption of pelvic ring, sequela: Secondary | ICD-10-CM | POA: Diagnosis not present

## 2017-10-15 DIAGNOSIS — R296 Repeated falls: Secondary | ICD-10-CM | POA: Diagnosis not present

## 2017-10-15 DIAGNOSIS — R2689 Other abnormalities of gait and mobility: Secondary | ICD-10-CM | POA: Diagnosis not present

## 2017-10-15 DIAGNOSIS — M6281 Muscle weakness (generalized): Secondary | ICD-10-CM | POA: Diagnosis not present

## 2017-10-19 DIAGNOSIS — R2689 Other abnormalities of gait and mobility: Secondary | ICD-10-CM | POA: Diagnosis not present

## 2017-10-19 DIAGNOSIS — M6281 Muscle weakness (generalized): Secondary | ICD-10-CM | POA: Diagnosis not present

## 2017-10-19 DIAGNOSIS — R296 Repeated falls: Secondary | ICD-10-CM | POA: Diagnosis not present

## 2017-10-19 DIAGNOSIS — S3282XS Multiple fractures of pelvis without disruption of pelvic ring, sequela: Secondary | ICD-10-CM | POA: Diagnosis not present

## 2017-10-20 ENCOUNTER — Other Ambulatory Visit: Payer: Self-pay | Admitting: Cardiovascular Disease

## 2017-10-21 ENCOUNTER — Other Ambulatory Visit: Payer: Self-pay | Admitting: Cardiovascular Disease

## 2017-10-21 DIAGNOSIS — S3282XS Multiple fractures of pelvis without disruption of pelvic ring, sequela: Secondary | ICD-10-CM | POA: Diagnosis not present

## 2017-10-21 DIAGNOSIS — R2689 Other abnormalities of gait and mobility: Secondary | ICD-10-CM | POA: Diagnosis not present

## 2017-10-21 DIAGNOSIS — R296 Repeated falls: Secondary | ICD-10-CM | POA: Diagnosis not present

## 2017-10-21 DIAGNOSIS — M6281 Muscle weakness (generalized): Secondary | ICD-10-CM | POA: Diagnosis not present

## 2017-10-22 DIAGNOSIS — S3282XS Multiple fractures of pelvis without disruption of pelvic ring, sequela: Secondary | ICD-10-CM | POA: Diagnosis not present

## 2017-10-22 DIAGNOSIS — R296 Repeated falls: Secondary | ICD-10-CM | POA: Diagnosis not present

## 2017-10-22 DIAGNOSIS — R2689 Other abnormalities of gait and mobility: Secondary | ICD-10-CM | POA: Diagnosis not present

## 2017-10-22 DIAGNOSIS — M6281 Muscle weakness (generalized): Secondary | ICD-10-CM | POA: Diagnosis not present

## 2017-10-27 DIAGNOSIS — M6281 Muscle weakness (generalized): Secondary | ICD-10-CM | POA: Diagnosis not present

## 2017-10-27 DIAGNOSIS — S3282XS Multiple fractures of pelvis without disruption of pelvic ring, sequela: Secondary | ICD-10-CM | POA: Diagnosis not present

## 2017-10-27 DIAGNOSIS — R296 Repeated falls: Secondary | ICD-10-CM | POA: Diagnosis not present

## 2017-10-27 DIAGNOSIS — R2689 Other abnormalities of gait and mobility: Secondary | ICD-10-CM | POA: Diagnosis not present

## 2017-10-28 DIAGNOSIS — R2689 Other abnormalities of gait and mobility: Secondary | ICD-10-CM | POA: Diagnosis not present

## 2017-10-28 DIAGNOSIS — R296 Repeated falls: Secondary | ICD-10-CM | POA: Diagnosis not present

## 2017-10-28 DIAGNOSIS — S3282XS Multiple fractures of pelvis without disruption of pelvic ring, sequela: Secondary | ICD-10-CM | POA: Diagnosis not present

## 2017-10-28 DIAGNOSIS — M6281 Muscle weakness (generalized): Secondary | ICD-10-CM | POA: Diagnosis not present

## 2017-11-01 ENCOUNTER — Telehealth: Payer: Self-pay | Admitting: Cardiovascular Disease

## 2017-11-01 DIAGNOSIS — S3282XS Multiple fractures of pelvis without disruption of pelvic ring, sequela: Secondary | ICD-10-CM | POA: Diagnosis not present

## 2017-11-01 DIAGNOSIS — R296 Repeated falls: Secondary | ICD-10-CM | POA: Diagnosis not present

## 2017-11-01 DIAGNOSIS — R2689 Other abnormalities of gait and mobility: Secondary | ICD-10-CM | POA: Diagnosis not present

## 2017-11-01 DIAGNOSIS — M6281 Muscle weakness (generalized): Secondary | ICD-10-CM | POA: Diagnosis not present

## 2017-11-01 MED ORDER — ROSUVASTATIN CALCIUM 20 MG PO TABS: 20.0000 mg | ORAL_TABLET | Freq: Every day | ORAL | 0 refills | Status: DC

## 2017-11-01 MED ORDER — LOSARTAN POTASSIUM 25 MG PO TABS: 25.0000 mg | ORAL_TABLET | Freq: Two times a day (BID) | ORAL | 0 refills | Status: DC

## 2017-11-01 MED ORDER — METOPROLOL SUCCINATE ER 25 MG PO TB24: 25.0000 mg | ORAL_TABLET | Freq: Every day | ORAL | 0 refills | Status: DC

## 2017-11-01 MED ORDER — NITROGLYCERIN 0.4 MG SL SUBL: 0.4000 mg | SUBLINGUAL_TABLET | SUBLINGUAL | 0 refills | Status: DC | PRN

## 2017-11-01 MED ORDER — PANTOPRAZOLE SODIUM 40 MG PO TBEC: 40.0000 mg | DELAYED_RELEASE_TABLET | Freq: Every day | ORAL | 0 refills | Status: DC

## 2017-11-01 NOTE — Telephone Encounter (Signed)
New Message    *STAT* If patient is at the pharmacy, call can be transferred to refill team.   1. Which medications need to be refilled? (please list name of each medication and dose if known) pantoprazole (PROTONIX) 40 MG tablet, but says she will need all her medications refilled before her appt on 8/29  2. Which pharmacy/location (including street and city if local pharmacy) is medication to be sent to? [facility] [address] [place]  3. Do they need a 30 day or 90 day supply? [place]

## 2017-11-09 DIAGNOSIS — R41841 Cognitive communication deficit: Secondary | ICD-10-CM | POA: Diagnosis not present

## 2017-11-10 DIAGNOSIS — R41841 Cognitive communication deficit: Secondary | ICD-10-CM | POA: Diagnosis not present

## 2017-11-11 DIAGNOSIS — R41841 Cognitive communication deficit: Secondary | ICD-10-CM | POA: Diagnosis not present

## 2017-11-16 DIAGNOSIS — R41841 Cognitive communication deficit: Secondary | ICD-10-CM | POA: Diagnosis not present

## 2017-11-17 DIAGNOSIS — R41841 Cognitive communication deficit: Secondary | ICD-10-CM | POA: Diagnosis not present

## 2017-11-18 DIAGNOSIS — R41841 Cognitive communication deficit: Secondary | ICD-10-CM | POA: Diagnosis not present

## 2017-11-23 DIAGNOSIS — R41841 Cognitive communication deficit: Secondary | ICD-10-CM | POA: Diagnosis not present

## 2017-11-24 DIAGNOSIS — R41841 Cognitive communication deficit: Secondary | ICD-10-CM | POA: Diagnosis not present

## 2017-11-30 DIAGNOSIS — R41841 Cognitive communication deficit: Secondary | ICD-10-CM | POA: Diagnosis not present

## 2017-12-01 DIAGNOSIS — R41841 Cognitive communication deficit: Secondary | ICD-10-CM | POA: Diagnosis not present

## 2017-12-02 DIAGNOSIS — R41841 Cognitive communication deficit: Secondary | ICD-10-CM | POA: Diagnosis not present

## 2017-12-07 DIAGNOSIS — R41841 Cognitive communication deficit: Secondary | ICD-10-CM | POA: Diagnosis not present

## 2017-12-08 DIAGNOSIS — R41841 Cognitive communication deficit: Secondary | ICD-10-CM | POA: Diagnosis not present

## 2017-12-09 DIAGNOSIS — R41841 Cognitive communication deficit: Secondary | ICD-10-CM | POA: Diagnosis not present

## 2017-12-13 DIAGNOSIS — N3001 Acute cystitis with hematuria: Secondary | ICD-10-CM | POA: Diagnosis not present

## 2017-12-13 DIAGNOSIS — N3000 Acute cystitis without hematuria: Secondary | ICD-10-CM | POA: Diagnosis not present

## 2017-12-13 DIAGNOSIS — B9689 Other specified bacterial agents as the cause of diseases classified elsewhere: Secondary | ICD-10-CM | POA: Diagnosis not present

## 2017-12-14 DIAGNOSIS — R41841 Cognitive communication deficit: Secondary | ICD-10-CM | POA: Diagnosis not present

## 2017-12-21 DIAGNOSIS — R41841 Cognitive communication deficit: Secondary | ICD-10-CM | POA: Diagnosis not present

## 2017-12-24 DIAGNOSIS — I10 Essential (primary) hypertension: Secondary | ICD-10-CM | POA: Diagnosis not present

## 2017-12-24 DIAGNOSIS — Z0000 Encounter for general adult medical examination without abnormal findings: Secondary | ICD-10-CM | POA: Diagnosis not present

## 2017-12-24 DIAGNOSIS — F325 Major depressive disorder, single episode, in full remission: Secondary | ICD-10-CM | POA: Diagnosis not present

## 2017-12-24 DIAGNOSIS — M810 Age-related osteoporosis without current pathological fracture: Secondary | ICD-10-CM | POA: Diagnosis not present

## 2017-12-24 DIAGNOSIS — Z1389 Encounter for screening for other disorder: Secondary | ICD-10-CM | POA: Diagnosis not present

## 2017-12-24 DIAGNOSIS — E039 Hypothyroidism, unspecified: Secondary | ICD-10-CM | POA: Diagnosis not present

## 2017-12-24 DIAGNOSIS — N390 Urinary tract infection, site not specified: Secondary | ICD-10-CM | POA: Diagnosis not present

## 2018-01-13 DIAGNOSIS — M6281 Muscle weakness (generalized): Secondary | ICD-10-CM | POA: Diagnosis not present

## 2018-01-13 DIAGNOSIS — R296 Repeated falls: Secondary | ICD-10-CM | POA: Diagnosis not present

## 2018-01-13 DIAGNOSIS — R2681 Unsteadiness on feet: Secondary | ICD-10-CM | POA: Diagnosis not present

## 2018-01-13 DIAGNOSIS — R278 Other lack of coordination: Secondary | ICD-10-CM | POA: Diagnosis not present

## 2018-01-16 ENCOUNTER — Other Ambulatory Visit: Payer: Self-pay | Admitting: Cardiovascular Disease

## 2018-01-18 DIAGNOSIS — R296 Repeated falls: Secondary | ICD-10-CM | POA: Diagnosis not present

## 2018-01-18 DIAGNOSIS — M6281 Muscle weakness (generalized): Secondary | ICD-10-CM | POA: Diagnosis not present

## 2018-01-18 DIAGNOSIS — R2681 Unsteadiness on feet: Secondary | ICD-10-CM | POA: Diagnosis not present

## 2018-01-18 DIAGNOSIS — R278 Other lack of coordination: Secondary | ICD-10-CM | POA: Diagnosis not present

## 2018-01-19 DIAGNOSIS — R296 Repeated falls: Secondary | ICD-10-CM | POA: Diagnosis not present

## 2018-01-19 DIAGNOSIS — R2681 Unsteadiness on feet: Secondary | ICD-10-CM | POA: Diagnosis not present

## 2018-01-19 DIAGNOSIS — R278 Other lack of coordination: Secondary | ICD-10-CM | POA: Diagnosis not present

## 2018-01-19 DIAGNOSIS — M6281 Muscle weakness (generalized): Secondary | ICD-10-CM | POA: Diagnosis not present

## 2018-01-20 ENCOUNTER — Encounter: Payer: Self-pay | Admitting: Cardiovascular Disease

## 2018-01-20 ENCOUNTER — Ambulatory Visit (INDEPENDENT_AMBULATORY_CARE_PROVIDER_SITE_OTHER): Payer: Medicare Other | Admitting: Cardiovascular Disease

## 2018-01-20 ENCOUNTER — Ambulatory Visit (HOSPITAL_COMMUNITY)
Admission: RE | Admit: 2018-01-20 | Discharge: 2018-01-20 | Disposition: A | Discharge status: Home / Self Care | Payer: Medicare Other | Source: Ambulatory Visit | Attending: Cardiovascular Disease | Admitting: Cardiovascular Disease

## 2018-01-20 VITALS — Ht 61.0 in | Wt 174.2 lb

## 2018-01-20 DIAGNOSIS — G3281 Cerebellar ataxia in diseases classified elsewhere: Secondary | ICD-10-CM

## 2018-01-20 DIAGNOSIS — I5189 Other ill-defined heart diseases: Secondary | ICD-10-CM

## 2018-01-20 DIAGNOSIS — I671 Cerebral aneurysm, nonruptured: Secondary | ICD-10-CM | POA: Diagnosis not present

## 2018-01-20 DIAGNOSIS — I6502 Occlusion and stenosis of left vertebral artery: Secondary | ICD-10-CM | POA: Diagnosis not present

## 2018-01-20 DIAGNOSIS — I25810 Atherosclerosis of coronary artery bypass graft(s) without angina pectoris: Secondary | ICD-10-CM

## 2018-01-20 DIAGNOSIS — N390 Urinary tract infection, site not specified: Secondary | ICD-10-CM | POA: Diagnosis not present

## 2018-01-20 DIAGNOSIS — I6509 Occlusion and stenosis of unspecified vertebral artery: Secondary | ICD-10-CM | POA: Diagnosis not present

## 2018-01-20 DIAGNOSIS — Z8673 Personal history of transient ischemic attack (TIA), and cerebral infarction without residual deficits: Secondary | ICD-10-CM | POA: Diagnosis not present

## 2018-01-20 DIAGNOSIS — I10 Essential (primary) hypertension: Secondary | ICD-10-CM | POA: Diagnosis not present

## 2018-01-20 DIAGNOSIS — R42 Dizziness and giddiness: Secondary | ICD-10-CM

## 2018-01-20 DIAGNOSIS — I651 Occlusion and stenosis of basilar artery: Secondary | ICD-10-CM

## 2018-01-20 DIAGNOSIS — N3946 Mixed incontinence: Secondary | ICD-10-CM | POA: Diagnosis not present

## 2018-01-20 DIAGNOSIS — R270 Ataxia, unspecified: Secondary | ICD-10-CM | POA: Diagnosis not present

## 2018-01-20 DIAGNOSIS — E785 Hyperlipidemia, unspecified: Secondary | ICD-10-CM

## 2018-01-20 MED ORDER — GADOBENATE DIMEGLUMINE 529 MG/ML IV SOLN
20.0000 mL | Freq: Once | INTRAVENOUS | Status: AC | PRN
  Administered 2018-01-20: 16 mL via INTRAVENOUS

## 2018-01-20 NOTE — Patient Instructions (Addendum)
Medication Instructions: Dr Sallyanne Kuster recommends that you continue on your current medications as directed. Please refer to the Current Medication list given to you today.  Labwork: Your physician recommends that you return for lab work at your convenience - FASTING.  Testing/Procedures: 1. MRI/MRA of head - this has been ordered to be completed at [facility].  Please schedule an appoinment with Dr Rosalin Hawking, your neurologist.  Follow-up: Dr Sallyanne Kuster recommends that you schedule a follow-up appointment in 12 months. You will receive a reminder letter in the mail two months in advance. If you don't receive a letter, please call our office to schedule the follow-up appointment.  If you need a refill on your cardiac medications before your next appointment, please call your pharmacy.

## 2018-01-20 NOTE — Progress Notes (Signed)
Patient ID: Gina Bowers, female   DOB: Sep 22, 1925, 84 y.o.   MRN: [id number] Patient ID: Gina Bowers, female   DOB: [birth year], 84 y.o.   MRN: [id number]    Cardiology Office Note    Date:  01/22/2018   ID:  Gina Bowers, DOB 01/21/1926, MRN [id number]  PCP:  Lavone Orn, MD  Cardiologist:   Sanda Klein, MD   Chief Complaint  Patient presents with  . Dizziness    History of Present Illness:  Gina Bowers is a 84 y.o. female an extensive history of coronary, cerebrovascular and peripheral arterial disease who presents for follow-up.  She was in her usual state of health when she woke up this morning but no longer getting out of bed this ataxia.  She had a hip fracture in January.  She recovered well from this therapy she did have another mechanical fall about a week and a half ago has been receiving physical therapy.  This dizziness is apparently a new problem.  She does not describe true vertigo.  She does not have nausea.  She denies diplopia or any focal weakness, she has not had dysarthria.  She remains quite sedentary.  The patient specifically denies any chest pain at rest or exertion, dyspnea at rest or with exertion, orthopnea, paroxysmal nocturnal dyspnea, syncope, palpitations, intermittent claudication, lower extremity edema, unexplained weight gain, cough, hemoptysis or wheezing.   She continues to take aspirin and Plavix and high-dose statin, low-dose long-acting nitrates and beta blockers, dose limited by bradycardia.  May 2017 cardiac catheterization showed no significant change in her coronary anatomy. The stent in the right coronary artery is widely patent. The mammary artery to the LAD and SVG to diagonal bypasses are open. There is a long segment of 90% stenosis in the oblique marginal artery that was felt best managed by medical therapy. Left ventricular end-diastolic pressure was only 7 mmHg.        Her echo was unchanged: - Left ventricle: The cavity  size was normal. Systolic function was  normal. The estimated ejection fraction was in the range of 60%  to 65%. Wall motion was normal; there were no regional wall  motion abnormalities. There was an increased relative  contribution of atrial contraction to ventricular filling.  Doppler parameters are consistent with abnormal left ventricular  relaxation (grade 1 diastolic dysfunction).  In 2009 she underwent four-vessel bypass surgery. In 2012 she had chest pain while in [facility] and cardiac catheterization showed occlusion of the SVG to RCA and a stent was placed in the 95% stenosis of the native RCA. There was also 90% stenosis of the left main coronary artery with a patent LIMA to the LAD and a patent SVG to the diagonal. There is no mention of the SVG to OM (OM is described as a moderate size vessel with a 75% proximal stenosis). A nuclear stress test in 2013 was normal and she has normal left ventricular systolic function by echo as well. Echo in 2013 showed evidence of diastolic dysfunction as well as elevated filling pressures although congestive heart failure has not been a clinically evident problem. She has a history of ischemic colitis in 2010. She had a stroke in the remote past, Possibly another one in 2013. MRI has demonstrated chronic total occlusion of the left vertebral artery, moderate tandem stenosis of the right vertebral artery , high-grade stenosis of the proximal basilar artery, Lesion of the left PICA as well as extensive intracerebral arteriovascular disease  in the cerebrum. She has mild short-term memory loss.   Past Medical History:  Diagnosis Date  . Arthritis   . Blood transfusion 2007   AFTER HIP REPLACEMENT  . Carotid bruit    PT'S DAUGHTER STATES PT HAD RECENT CAROTID STUDY AND WAS TOLD NO SIGNIFICANT BLOCKAGES  . Chronic diastolic CHF (congestive heart failure) ([place])    a. 07/2015 Ech: Ef 60-65%, Gr1 DD.  Marland Kitchen Coronary artery disease    a.  2009 CABG 4, Dr. Servando Snare (LIMA to LAD , SVG to diagonal, SVG to circumflex, SVG to PDA); b. Cath 2012 in  Recent ill,[place] patent LIMA , Patent SVG to diagonal, occluded SVG to RCA. 5x31mm BMS->native RCA; c. 09/2015 Cath: LM nl, LAD 162m, D1 100, LCX small, OM1 90/small, OM2 70, RCA 45p, patent stent, VG->dRCA 100, VG->OM1 100, VG->Diag nl, LIMA->LAD nl->Med Rx.  . Depression   . Fracture FEB 2013   FRACTURED LEFT ANKLE--NO SURGERY--WAS IN BOOT UNTIL COUPLE WEEKS AGO  . GERD (gastroesophageal reflux disease)   . High cholesterol   . Hypothyroidism   . Ischemic colitis ([place]) 2010  . Kidney stones    IN THE PAST  . 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 [place] virus    COUPLE OF WEEKS AGO--ALL SYMTOMS RESOLVED PER PT  . Obstructive sleep apnea 2010  . Shortness of breath    AND WHEEZING AT TIMES--NO INHALERS  . Stroke ([place])    SEVERAL STROKES -TOLD SHE HAS CEREBELLUM BLOCKAGE AS RESULT OF STROKE--BUT NEUROLOGIST SAID HE DID NOT WANT TO DO ANY PROCEDURE BECAUSE OF HER AGE .  PT HAS SLIGHT LEFT FOOT DROP-DRAGS FOOT WHEN WALKING - USES WALKER  . Urinary incontinence   . UTI (lower urinary tract infection)    HX OF UTI'S-- LAST TIME COUPLE OF MONTHS AGO  . Vertigo     Past Surgical History:  Procedure Laterality Date  . ABDOMINAL HYSTERECTOMY    . APPENDECTOMY    . [place] AND 1990  . CARDIAC CATHETERIZATION N/A 10/07/2015   Procedure: Left Heart Cath and Cors/Grafts Angiography;  Surgeon: Peter M Martinique, MD;  Location: [facility];  Service: Cardiovascular;  Laterality: N/A;  . CATARACT EXTRACTION    . CORONARY ANGIOPLASTY  10/2010   WITH STENT PLACEMENT  . CORONARY ARTERY BYPASS GRAFT  2009  . CYSTOSCOPY WITH INJECTION  09/21/2011   Procedure: CYSTOSCOPY WITH INJECTION;  Surgeon: Ailene Rud, MD;  Location: [facility];  Service: Urology;  Laterality: N/A;  Peri Urethral Macroplastique Injection and Estring Placement  . HERNIA REPAIR     . HIP SURGERY    . JOINT REPLACEMENT  2007   HIP REPLACEMENT -LEFT  . TONSILLECTOMY      Outpatient Medications Prior to Visit  Medication Sig Dispense Refill  . acetaminophen (TYLENOL) 500 MG tablet Take 1,000 mg by mouth every 6 (six) hours as needed for mild pain or fever.     . Albuterol Sulfate (PROAIR RESPICLICK) 725 (90 Base) MCG/ACT AEPB Inhale 2 puffs into the lungs daily as needed (shortness of breath).    Marland Kitchen alendronate (FOSAMAX) 70 MG tablet Take 1 tablet (70 mg total) by mouth once a week. Take with a full glass of water on an empty stomach.mon (Patient taking differently: Take 70 mg by mouth every Monday. Take with a full glass of water on an empty stomach) 15 tablet 0  . aspirin EC 81  MG tablet Take 81 mg by mouth every evening.     . Calcium Carb-Cholecalciferol (CALCIUM 1000 + D PO) Take 1 tablet by mouth every evening.     . cephALEXin (KEFLEX) 250 MG capsule     . ciprofloxacin (CIPRO) 250 MG tablet Take 1 tablet (250 mg total) by mouth 2 (two) times daily. For 7 days    . clopidogrel (PLAVIX) 75 MG tablet Take 1 tablet (75 mg total) by mouth daily. 90 tablet 3  . CRANBERRY EXTRACT PO Take 500 mg by mouth 2 (two) times daily.     . fish oil-omega-3 fatty acids 1000 MG capsule Take 1 g by mouth every evening.     Marland Kitchen HYDROcodone-acetaminophen (NORCO/VICODIN) 5-325 MG tablet Take 1 tablet by mouth every 4 (four) hours as needed for moderate pain or severe pain. 20 tablet 0  . ipratropium-albuterol (DUONEB) 0.5-2.5 (3) MG/3ML SOLN Take 3 mLs by nebulization every 4 (four) hours as needed. 360 mL 1  . isosorbide mononitrate (IMDUR) 30 MG 24 hr tablet TAKE 1/2 TABLET BY MOUTH DAILY 45 tablet 4  . levothyroxine (SYNTHROID, LEVOTHROID) 88 MCG tablet Take 1 tablet (88 mcg total) by mouth daily before breakfast.    . losartan (COZAAR) 25 MG tablet Take 1 tablet (25 mg total) by mouth 2 (two) times daily. 180 tablet 0  . meclizine (ANTIVERT) 25 MG tablet Take 25 mg by mouth 3 (three)  times daily as needed for dizziness.    . metoprolol succinate (TOPROL-XL) 25 MG 24 hr tablet Take 1 tablet (25 mg total) by mouth daily. 90 tablet 0  . Multiple Vitamin (MULITIVITAMIN WITH MINERALS) TABS Take 1 tablet by mouth daily with breakfast.    . nitroGLYCERIN (NITROSTAT) 0.4 MG SL tablet Place 1 tablet (0.4 mg total) under the tongue every 5 (five) minutes as needed for chest pain. 75 tablet 0  . ondansetron (ZOFRAN ODT) 4 MG disintegrating tablet Take 1 tablet (4 mg total) by mouth every 6 (six) hours as needed for nausea or vomiting.    Marland Kitchen OVER THE COUNTER MEDICATION See admin instructions. Immuplex Capsule: Take 1 capsule by mouth 2 times a day    . OVER THE COUNTER MEDICATION Take 1 capsule by mouth 2 (two) times daily. D-MANOS    . pantoprazole (PROTONIX) 40 MG tablet Take 1 tablet (40 mg total) by mouth daily. KEEP OV. 90 tablet 0  . Polyethyl Glycol-Propyl Glycol (SYSTANE OP) Place 1 drop into both eyes 2 (two) times daily. Patient states its the Gel form (soothes)    . Probiotic CAPS Take 1 capsule by mouth daily with breakfast.    . rosuvastatin (CRESTOR) 20 MG tablet Take 1 tablet (20 mg total) by mouth daily. 90 tablet 0  . senna-docusate (SENOKOT-S) 8.6-50 MG tablet Take 1 tablet by mouth 2 (two) times daily.    . sertraline (ZOLOFT) 50 MG tablet Take 1 tablet (50 mg total) by mouth daily. 135 tablet 2  . solifenacin (VESICARE) 5 MG tablet Take 1 tablet (5 mg total) by mouth every evening.    Marland Kitchen levothyroxine (SYNTHROID, LEVOTHROID) 75 MCG tablet      No facility-administered medications prior to visit.      Allergies:   Sulfa antibiotics and Sulfonamide derivatives   Social History   Socioeconomic History  . Marital status: Widowed    Spouse name: Not on file  . Number of children: Not on file  . Years of education: Not on file  . Highest  education level: Not on file  Occupational History  . Not on file  Social Needs  . Financial resource strain: Not on file  .  Food insecurity:    Worry: Not on file    Inability: Not on file  . Transportation needs:    Medical: Not on file    Non-medical: Not on file  Tobacco Use  . Smoking status: Never Smoker  . Smokeless tobacco: Never Used  Substance and Sexual Activity  . Alcohol use: No  . Drug use: No  . Sexual activity: Not on file  Lifestyle  . Physical activity:    Days per week: Not on file    Minutes per session: Not on file  . Stress: Not on file  Relationships  . Social connections:    Talks on phone: Not on file    Gets together: Not on file    Attends religious service: Not on file    Active member of club or organization: Not on file    Attends meetings of clubs or organizations: Not on file    Relationship status: Not on file  Other Topics Concern  . Not on file  Social History Narrative  . Not on file     Family History:  The patient's family history includes Heart disease in her mother.   ROS:   Please see the history of present illness.    ROS All other systems are reviewed PHYSICAL EXAM:   VS:  Ht 5\' 1"  (1.549 m)   Wt 174 lb 3.2 oz (79 kg)   BMI 32.91 kg/m    She did not have any evidence of orthostatic hypotension Blood pressure was 174/89 sitting Blood pressure was 157/79 standing  No difference in blood pressures between the upper extremities General: Alert, oriented x3, no distress, appears concerned Head: no evidence of trauma, PERRL, EOMI, no exophtalmos or lid lag, no myxedema, no xanthelasma; normal ears, nose and oropharynx Neck: normal jugular venous pulsations and no hepatojugular reflux; brisk carotid pulses without delay, but with bilateral carotid bruits Chest: clear to auscultation, no signs of consolidation by percussion or palpation, normal fremitus, symmetrical and full respiratory excursions Cardiovascular: normal position and quality of the apical impulse, regular rhythm, normal first and second heart sounds, no murmurs, rubs or gallops Abdomen:  no tenderness or distention, no masses by palpation, no abnormal pulsatility or arterial bruits, normal bowel sounds, no hepatosplenomegaly Extremities: no clubbing, cyanosis or edema; 2+ radial, ulnar and brachial pulses bilaterally; 2+ right femoral, posterior tibial and dorsalis pedis pulses; 2+ left femoral, posterior tibial and dorsalis pedis pulses; no subclavian or femoral bruits Neurological: grossly nonfocal, other than mild finger-nose ataxia Psych: Normal mood and affect   Wt Readings from Last 3 Encounters:  01/20/18 174 lb 3.2 oz (79 kg)  07/30/17 169 lb (76.7 kg)  07/02/17 160 lb (72.6 kg)      Studies/Labs Reviewed:   EKG:  EKG is ordered today.  The ekg ordered today demonstrates mild sinus bradycardia with a single PVC, LVH by voltage only Recent Labs: 06/08/2017: ALT 21; Magnesium 1.9; TSH 6.545 06/12/2017: BUN 12; Hemoglobin 10.6; Platelets 181; Potassium 3.7; Sodium 132 01/20/2018: Creatinine, Ser 0.60   Lipid Panel    Component Value Date/Time   CHOL 147 08/17/2015 0648   TRIG 70 08/17/2015 0648   HDL 58 08/17/2015 0648   CHOLHDL 2.5 08/17/2015 0648   VLDL 14 08/17/2015 0648   LDLCALC 75 08/17/2015 0648   Reviewed her neurologist notes  from her last visit 2017.  He had recommended a follow-up MRI MRA which has not been performed.  Discussed today's symptoms with Dr. Erlinda Hong: he recommends to go ahead with the MRI-MRA today   ASSESSMENT:    1. Coronary artery disease involving coronary bypass graft of native heart without angina pectoris   2. Diastolic dysfunction   3. Essential hypertension   4. Stenosis of left vertebral artery   5. Dyslipidemia   6. Dizziness   7. Cerebellar ataxia in diseases classified elsewhere ([place])   8. Vertebrobasilar artery stenosis   9. History of stroke-March 2017 (rt brain)      PLAN:  In order of problems listed above:   1. CAD s/p CABG: shee does not have angina, she is on dual anti-therapy, statin, beta-blocker. Option  for OM percutaneous revascularization and stent, but this was felt to be challenging and with low likelihood of long-term patency due to the small caliber and lengthy segment of stenosis in this vessel.  This does not appear to be necessary. 2. Diastolic dysfunction by echo: Clinically euvolemic, no dyspnea, at least not with her sedentary lifestyle. Try to avoid diuretics due to the risk of symptomatic orthostatic hypotension. 3. HTN: Her blood pressure is definitely not low.  She has had significant past and she has severe cerebrovascular disease avoid excessive drops in blood pressure. 4. ASCVD: she has severe stenosis in the vertebral basilar system with occluded left vertebral artery and left PICA. She has had a stroke in the past. 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 MRI 2014 showed remote posterior right corona radiata infarction, possibly associated with her tendency for foot drop.  Has severe dizziness, unsteady gait: scheduled for MRI-MRA follow-up with Dr.Xu.  New aspirin 5. HLP: All lipid parameters are satisfactory, we need to get updated labs    Medication Adjustments/Labs and Tests Ordered: Current medicines are reviewed at length with the patient today.  Concerns regarding medicines are outlined above.  Medication changes, Labs and Tests ordered today are listed in the Patient Instructions below. Patient Instructions  Medication Instructions: Dr Sallyanne Kuster recommends that you continue on your current medications as directed. Please refer to the Current Medication list given to you today.  Labwork: Your physician recommends that you return for lab work at your convenience - FASTING.  Testing/Procedures: 1. MRI/MRA of head - this has been ordered to be completed at [facility].  Please schedule an appoinment with Dr Rosalin Hawking, your neurologist.  Follow-up: Dr Sallyanne Kuster recommends that you  schedule a follow-up appointment in 12 months. You will receive a reminder letter in the mail two months in advance. If you don't receive a letter, please call our office to schedule the follow-up appointment.  If you need a refill on your cardiac medications before your next appointment, please call your pharmacy.      Signed, Sanda Klein, MD  01/22/2018 4:01 PM    [facility] [address], [place], [place]  [address] Phone: [phone]; Fax: [phone]

## 2018-01-21 LAB — POCT I-STAT CREATININE: Creatinine, Ser: 0.6 mg/dL (ref 0.44–1.00)

## 2018-01-22 ENCOUNTER — Encounter: Payer: Self-pay | Admitting: Cardiovascular Disease

## 2018-01-25 DIAGNOSIS — I69391 Dysphagia following cerebral infarction: Secondary | ICD-10-CM | POA: Diagnosis not present

## 2018-01-25 DIAGNOSIS — R1312 Dysphagia, oropharyngeal phase: Secondary | ICD-10-CM | POA: Diagnosis not present

## 2018-01-25 DIAGNOSIS — R2681 Unsteadiness on feet: Secondary | ICD-10-CM | POA: Diagnosis not present

## 2018-01-25 DIAGNOSIS — R296 Repeated falls: Secondary | ICD-10-CM | POA: Diagnosis not present

## 2018-01-25 DIAGNOSIS — R278 Other lack of coordination: Secondary | ICD-10-CM | POA: Diagnosis not present

## 2018-01-25 DIAGNOSIS — M6281 Muscle weakness (generalized): Secondary | ICD-10-CM | POA: Diagnosis not present

## 2018-01-25 DIAGNOSIS — R41841 Cognitive communication deficit: Secondary | ICD-10-CM | POA: Diagnosis not present

## 2018-01-26 ENCOUNTER — Telehealth: Payer: Self-pay | Admitting: Neurology

## 2018-01-26 DIAGNOSIS — M6281 Muscle weakness (generalized): Secondary | ICD-10-CM | POA: Diagnosis not present

## 2018-01-26 DIAGNOSIS — R1312 Dysphagia, oropharyngeal phase: Secondary | ICD-10-CM | POA: Diagnosis not present

## 2018-01-26 DIAGNOSIS — I69391 Dysphagia following cerebral infarction: Secondary | ICD-10-CM | POA: Diagnosis not present

## 2018-01-26 DIAGNOSIS — R2681 Unsteadiness on feet: Secondary | ICD-10-CM | POA: Diagnosis not present

## 2018-01-26 DIAGNOSIS — R41841 Cognitive communication deficit: Secondary | ICD-10-CM | POA: Diagnosis not present

## 2018-01-26 DIAGNOSIS — R278 Other lack of coordination: Secondary | ICD-10-CM | POA: Diagnosis not present

## 2018-01-26 NOTE — Telephone Encounter (Signed)
I was contacted by Dr. Sallyanne Kuster that pt had complains of dizziness, unsteady gait. We decided to have MRI done to further evaluate. She is also due for a MRA repeat for her 7x4 mm left supraclinoid ICA aneurysm so MRA was also ordered.   MRI and MRA results came back showed no acute abnormalities but chronic left VA occlusion as well as chronic left distal ICA aneurysm with apparently 51mm larger than before. As per note, pt continues to have dizzy symptoms and would like to be seen by neurologist earlier but not able to make appointment after several phone call attempts.   Linus Galas and Wells Guiles, could you please help make an appointment with this pt to see either Dr. Leonie Man or Janett Billow next available? Thanks much.   Rosalin Hawking, MD PhD Stroke Neurology 01/26/2018 2:43 PM

## 2018-01-26 NOTE — Telephone Encounter (Signed)
Please schedule with Dr.Sethi.

## 2018-01-26 NOTE — Telephone Encounter (Signed)
Noted. Yes please schedule with Dr. Leonie Man as patient has not been seen since 2017. Thank you.

## 2018-01-26 NOTE — Telephone Encounter (Signed)
Thanks, Jindong!

## 2018-01-27 ENCOUNTER — Other Ambulatory Visit: Payer: Self-pay | Admitting: Cardiovascular Disease

## 2018-01-27 ENCOUNTER — Other Ambulatory Visit: Payer: Medicare Other

## 2018-01-27 NOTE — Telephone Encounter (Signed)
ok 

## 2018-01-31 ENCOUNTER — Ambulatory Visit (INDEPENDENT_AMBULATORY_CARE_PROVIDER_SITE_OTHER): Payer: Medicare Other | Admitting: Neurology

## 2018-01-31 ENCOUNTER — Encounter: Payer: Self-pay | Admitting: Neurology

## 2018-01-31 VITALS — BP 137/73 | HR 59 | Ht 61.0 in | Wt 171.0 lb

## 2018-01-31 DIAGNOSIS — I651 Occlusion and stenosis of basilar artery: Secondary | ICD-10-CM | POA: Diagnosis not present

## 2018-01-31 DIAGNOSIS — I6509 Occlusion and stenosis of unspecified vertebral artery: Secondary | ICD-10-CM

## 2018-01-31 DIAGNOSIS — R42 Dizziness and giddiness: Secondary | ICD-10-CM

## 2018-01-31 NOTE — Progress Notes (Signed)
[facility] [address] [place]. [place] [address] [phone]       OFFICE CONSULT NOTE  Gina. Gina Bowers Date of Birth:  04/07/1926 Medical Record Number:  [id number]   Referring MD:  Rosalin Hawking  Reason for Referral:  Dizzy spells  HPI: Gina Bowers is a pleasant 84 year old Caucasian lady is accompanied today by her daughter. History is obtained from them and review of electronic medical records. I personally reviewed imaging  films.she has PMH of Stroke 25 years ago with left-sided weakness but no residual, right CR small infarct in 10/2011, CAD s/p CABG in 2009.on 08/16/2015 she was admitted with left facial droop, left eye flashing light and MRI showed equivocal right occipital punctate DWI signal not very convincing for acute stroke. MRA showed chronic left vertebral artery occlusion in proximal basilar artery stenosis. She has had previous episodes of benign paroxysmal positional vertigo off and on for the last 10 years at least. These episodes did not last long and occasionally positional. However on 01/20/18 she had a prolonged episode with nausea dizziness and vertigo. An MRI scan of the brain was obtained which did not show an acute infarct. MRA showed chronic left vertebral artery occlusion as well as terminal left internal carotid artery aneurysm which appeared to be 8 mm in size which was slightly increase compared to previous MRA size of the aneurysm. The patient has now since about 2 living and independent living facility. She has not been walking a lot and spends most of the time in a beachchair. She reports only occasional headache but feels that her dizziness seems to be improving in the last 5 days or so. She gets fatigued very easily and she sleeps a lot. The patient is getting some physical therapy but she is not specifically getting vestibular therapy. She admits that her dizziness has improved in the past when she had gotten vestibular rehabilitation. She  remains on aspirin for stroke prevention which is tolerating well. The patient had seen Dr. Erlinda Hong in the past but after recent admission in the hospital a week ago the family requested an urgent visit with  neurologist in the clinic prompting this consult ROS:   14 system review of systems is positive for unsteady gait, dizzy spells, balance difficulties, nausea and all other systems negative  PMH:  Past Medical History:  Diagnosis Date  . Arthritis   . Blood transfusion 2007   AFTER HIP REPLACEMENT  . Carotid bruit    PT'S DAUGHTER STATES PT HAD RECENT CAROTID STUDY AND WAS TOLD NO SIGNIFICANT BLOCKAGES  . Chronic diastolic CHF (congestive heart failure) ([place])    a. 07/2015 Ech: Ef 60-65%, Gr1 DD.  Marland Kitchen Coronary artery disease    a. 2009 CABG 4, Dr. Servando Snare (LIMA to LAD , SVG to diagonal, SVG to circumflex, SVG to PDA); b. Cath 2012 in  Recent ill,[place] patent LIMA , Patent SVG to diagonal, occluded SVG to RCA. 5x55mm BMS->native RCA; c. 09/2015 Cath: LM nl, LAD 164m, D1 100, LCX small, OM1 90/small, OM2 70, RCA 45p, patent stent, VG->dRCA 100, VG->OM1 100, VG->Diag nl, LIMA->LAD nl->Med Rx.  . Depression   . Fracture FEB 2013   FRACTURED LEFT ANKLE--NO SURGERY--WAS IN BOOT UNTIL COUPLE WEEKS AGO  . GERD (gastroesophageal reflux disease)   . High cholesterol   . Hypothyroidism   . Ischemic colitis ([place]) 2010  . Kidney stones    IN THE PAST  . Lung nodules    TOLD SHE HAS INTERSTITUAL  LUNG DISEASE  . [place] virus    COUPLE OF WEEKS AGO--ALL SYMTOMS RESOLVED PER PT  . Obstructive sleep apnea 2010  . Shortness of breath    AND WHEEZING AT TIMES--NO INHALERS  . Stroke ([place])    SEVERAL STROKES -TOLD SHE HAS CEREBELLUM BLOCKAGE AS RESULT OF STROKE--BUT NEUROLOGIST SAID HE DID NOT WANT TO DO ANY PROCEDURE BECAUSE OF HER AGE .  PT HAS SLIGHT LEFT FOOT DROP-DRAGS FOOT WHEN WALKING - USES WALKER  . Urinary incontinence   . UTI (lower urinary tract infection)    HX OF UTI'S-- LAST TIME  COUPLE OF MONTHS AGO  . Vertigo     Social History:  Social History   Socioeconomic History  . Marital status: Widowed    Spouse name: Not on file  . Number of children: Not on file  . Years of education: Not on file  . Highest education level: Not on file  Occupational History  . Not on file  Social Needs  . Financial resource strain: Not on file  . Food insecurity:    Worry: Not on file    Inability: Not on file  . Transportation needs:    Medical: Not on file    Non-medical: Not on file  Tobacco Use  . Smoking status: Never Smoker  . Smokeless tobacco: Never Used  Substance and Sexual Activity  . Alcohol use: No  . Drug use: No  . Sexual activity: Not on file  Lifestyle  . Physical activity:    Days per week: Not on file    Minutes per session: Not on file  . Stress: Not on file  Relationships  . Social connections:    Talks on phone: Not on file    Gets together: Not on file    Attends religious service: Not on file    Active member of club or organization: Not on file    Attends meetings of clubs or organizations: Not on file    Relationship status: Not on file  . Intimate partner violence:    Fear of current or ex partner: Not on file    Emotionally abused: Not on file    Physically abused: Not on file    Forced sexual activity: Not on file  Other Topics Concern  . Not on file  Social History Narrative  . Not on file    Medications:   Current Outpatient Medications on File Prior to Visit  Medication Sig Dispense Refill  . acetaminophen (TYLENOL) 500 MG tablet Take 1,000 mg by mouth every 6 (six) hours as needed for mild pain or fever.     . Albuterol Sulfate (PROAIR RESPICLICK) 921 (90 Base) MCG/ACT AEPB Inhale 2 puffs into the lungs daily as needed (shortness of breath).    Marland Kitchen alendronate (FOSAMAX) 70 MG tablet Take 1 tablet (70 mg total) by mouth once a week. Take with a full glass of water on an empty stomach.mon (Patient taking differently: Take 70  mg by mouth every Monday. Take with a full glass of water on an empty stomach) 15 tablet 0  . aspirin EC 81 MG tablet Take 81 mg by mouth every evening.     . Calcium Carb-Cholecalciferol (CALCIUM 1000 + D PO) Take 1 tablet by mouth every evening.     . cephALEXin (KEFLEX) 250 MG capsule     . clopidogrel (PLAVIX) 75 MG tablet Take 1 tablet (75 mg total) by mouth daily. 90 tablet 3  . CRANBERRY EXTRACT PO  Take 500 mg by mouth 2 (two) times daily.     . fish oil-omega-3 fatty acids 1000 MG capsule Take 1 g by mouth every evening.     Marland Kitchen ipratropium-albuterol (DUONEB) 0.5-2.5 (3) MG/3ML SOLN Take 3 mLs by nebulization every 4 (four) hours as needed. 360 mL 1  . isosorbide mononitrate (IMDUR) 30 MG 24 hr tablet TAKE 1/2 TABLET BY MOUTH DAILY 45 tablet 4  . levothyroxine (SYNTHROID, LEVOTHROID) 88 MCG tablet Take 1 tablet (88 mcg total) by mouth daily before breakfast.    . losartan (COZAAR) 25 MG tablet Take 1 tablet (25 mg total) by mouth 2 (two) times daily. 180 tablet 0  . meclizine (ANTIVERT) 25 MG tablet Take 25 mg by mouth 3 (three) times daily as needed for dizziness.    . metoprolol succinate (TOPROL-XL) 25 MG 24 hr tablet Take 1 tablet (25 mg total) by mouth daily. 90 tablet 0  . Multiple Vitamin (MULITIVITAMIN WITH MINERALS) TABS Take 1 tablet by mouth daily with breakfast.    . nitroGLYCERIN (NITROSTAT) 0.4 MG SL tablet Place 1 tablet (0.4 mg total) under the tongue every 5 (five) minutes as needed for chest pain. 75 tablet 0  . ondansetron (ZOFRAN ODT) 4 MG disintegrating tablet Take 1 tablet (4 mg total) by mouth every 6 (six) hours as needed for nausea or vomiting.    Marland Kitchen OVER THE COUNTER MEDICATION See admin instructions. Immuplex Capsule: Take 1 capsule by mouth 2 times a day    . OVER THE COUNTER MEDICATION Take 1 capsule by mouth 2 (two) times daily. D-MANOS    . pantoprazole (PROTONIX) 40 MG tablet TAKE ONE TABLET BY MOUTH DAILY MUST KEEP APPOINTMENT 90 tablet 3  . Polyethyl  Glycol-Propyl Glycol (SYSTANE OP) Place 1 drop into both eyes 2 (two) times daily. Patient states its the Gel form (soothes)    . Probiotic CAPS Take 1 capsule by mouth daily with breakfast.    . rosuvastatin (CRESTOR) 20 MG tablet Take 1 tablet (20 mg total) by mouth daily. 90 tablet 0  . senna-docusate (SENOKOT-S) 8.6-50 MG tablet Take 1 tablet by mouth 2 (two) times daily.    . sertraline (ZOLOFT) 50 MG tablet Take 1 tablet (50 mg total) by mouth daily. 135 tablet 2  . solifenacin (VESICARE) 5 MG tablet Take 1 tablet (5 mg total) by mouth every evening. (Patient not taking: Reported on 01/31/2018)     No current facility-administered medications on file prior to visit.     Allergies:   Allergies  Allergen Reactions  . Sulfa Antibiotics Hives  . Sulfonamide Derivatives Hives    Physical Exam General: well developed, well nourished pleasant elderly Caucasian lady, seated in a wheelchair, in no evident distress Head: head normocephalic and atraumatic.   Neck: supple with no carotid or supraclavicular bruits Cardiovascular: regular rate and rhythm, no murmurs Musculoskeletal: no deformity Skin:  no rash/petichiae. Prominent midline chest scar from cardiac surgery Vascular:  Normal pulses all extremities  Neurologic Exam Mental Status: Awake and fully alert. Oriented to place and time. Recent and remote memory poor. Attention span, concentration and fund of knowledge diminished Mood and affect appropriate.  Cranial Nerves: Fundoscopic exam reveals sharp disc margins. Pupils equal, briskly reactive to light. Extraocular movements full without nystagmus. Visual fields full to confrontation. Hearing diminished bilaterallyFacial sensation intact. Face, tongue, palate moves normally and symmetrically.  Motor: Normal bulk and tone. Normal strength in all tested extremity muscles.except diminished fine finger movements on the left.  Mild left grip weakness. Orbits right over left approximately. Mild  weakness of left hip flexors and ankle dorsiflexors. Sensory.: intact to touch , pinprick , position and vibratory sensation.  Coordination: Rapid alternating movements normal in all extremities. Finger-to-nose and heel-to-shin performed accurately bilaterally. Gait and Station: Arises from chair with some difficulty. Stance is broad based. Gait with one person assist demonstrates Wide base and slight imbalance .Not able  to heel, toe and tandem walk .  Reflexes: 1+ and symmetric. Toes downgoing.       ASSESSMENT:  84 year old Caucasian lady with increasing dizziness and unsteady gait likely multifactorial in etiology given long-standing history of benign paroxysmal positional vertigo,cerebrovascular disease and degenerative arthritis.Recent neuroimaging studies show no acute abnormalities and chronic stable  Left vertebral artery occlusion and left distal ICA aneurysm which needs to be managed conservatively given her advanced age     PLAN: I had a long discussion the patient and her daughter regarding her recent dizzy episode and discuss my differential diagnosis treatment and evaluation plan and answered questions. She has long-standing history of benign paroxysmal vertigo as well as prior history of strokes and degenerative arthritis,cerebrovascular disease and I recommend she continue ongoing physical therapy and also do vestibular rehabilitation for dizziness exercises. She was encouraged to use a walker at all times and to follow fall and safety precautions. She will continue aspirin for stroke prevention and maintain strict control of hypertension with a pressure goal below 130/90 and lipids with LDL cholesterol goal below 70 mg percent. Greater than 50% time during this 45 minute consultation visit was spent on counseling and coordination of care about her dizziness and vertigo and answering questions. . Also discussed her chronic terminal left ICA aneurysm which needs to be managed  conservatively given her advanced age.Greater than 50% time during this 45 minute consultation visit was spent on counseling and coordination of care about her dizziness and an aunt paroxysmal positional vertigo and cerebrovascular disease discussionShe will return for follow-up in the future only if necessary and no routine scheduled appointment was made Antony Contras, MD  [facility] [address] [place], [place] [address]  Phone [phone] Fax [phone] Note: This document was prepared with digital dictation and possible smart phrase technology. Any transcriptional errors that result from this process are unintentional.

## 2018-01-31 NOTE — Patient Instructions (Signed)
I had a long discussion the patient and her daughter regarding her recent dizzy episode and discuss my differential diagnosis treatment and evaluation plan and answered questions. She has long-standing history of benign paroxysmal vertigo as well as prior history of strokes and degenerative arthritis,cerebrovascular disease and I recommend she continue ongoing physical therapy and also do vestibular rehabilitation for dizziness exercises. She was encouraged to use a walker at all times and to follow fall and safety precautions. She will continue aspirin for stroke prevention and maintain strict control of hypertension with a pressure goal below 130/90 and lipids with LDL cholesterol goal below 70 mg percent. She will return for follow-up in the future only if necessary and no routine scheduled appointment was made.   Fall Prevention in the Home Falls can cause injuries. They can happen to people of all ages. There are many things you can do to make your home safe and to help prevent falls. What can I do on the outside of my home?  Regularly fix the edges of walkways and driveways and fix any cracks.  Remove anything that might make you trip as you walk through a door, such as a raised step or threshold.  Trim any bushes or trees on the path to your home.  Use bright outdoor lighting.  Clear any walking paths of anything that might make someone trip, such as rocks or tools.  Regularly check to see if handrails are loose or broken. Make sure that both sides of any steps have handrails.  Any raised decks and porches should have guardrails on the edges.  Have any leaves, snow, or ice cleared regularly.  Use sand or salt on walking paths during winter.  Clean up any spills in your garage right away. This includes oil or grease spills. What can I do in the bathroom?  Use night lights.  Install grab bars by the toilet and in the tub and shower. Do not use towel bars as grab bars.  Use  non-skid mats or decals in the tub or shower.  If you need to sit down in the shower, use a plastic, non-slip stool.  Keep the floor dry. Clean up any water that spills on the floor as soon as it happens.  Remove soap buildup in the tub or shower regularly.  Attach bath mats securely with double-sided non-slip rug tape.  Do not have throw rugs and other things on the floor that can make you trip. What can I do in the bedroom?  Use night lights.  Make sure that you have a light by your bed that is easy to reach.  Do not use any sheets or blankets that are too big for your bed. They should not hang down onto the floor.  Have a firm chair that has side arms. You can use this for support while you get dressed.  Do not have throw rugs and other things on the floor that can make you trip. What can I do in the kitchen?  Clean up any spills right away.  Avoid walking on wet floors.  Keep items that you use a lot in easy-to-reach places.  If you need to reach something above you, use a strong step stool that has a grab bar.  Keep electrical cords out of the way.  Do not use floor polish or wax that makes floors slippery. If you must use wax, use non-skid floor wax.  Do not have throw rugs and other things on the floor  that can make you trip. What can I do with my stairs?  Do not leave any items on the stairs.  Make sure that there are handrails on both sides of the stairs and use them. Fix handrails that are broken or loose. Make sure that handrails are as long as the stairways.  Check any carpeting to make sure that it is firmly attached to the stairs. Fix any carpet that is loose or worn.  Avoid having throw rugs at the top or bottom of the stairs. If you do have throw rugs, attach them to the floor with carpet tape.  Make sure that you have a light switch at the top of the stairs and the bottom of the stairs. If you do not have them, ask someone to add them for you. What  else can I do to help prevent falls?  Wear shoes that: ? Do not have high heels. ? Have rubber bottoms. ? Are comfortable and fit you well. ? Are closed at the toe. Do not wear sandals.  If you use a stepladder: ? Make sure that it is fully opened. Do not climb a closed stepladder. ? Make sure that both sides of the stepladder are locked into place. ? Ask someone to hold it for you, if possible.  Clearly mark and make sure that you can see: ? Any grab bars or handrails. ? First and last steps. ? Where the edge of each step is.  Use tools that help you move around (mobility aids) if they are needed. These include: ? Canes. ? Walkers. ? Scooters. ? Crutches.  Turn on the lights when you go into a dark area. Replace any light bulbs as soon as they burn out.  Set up your furniture so you have a clear path. Avoid moving your furniture around.  If any of your floors are uneven, fix them.  If there are any pets around you, be aware of where they are.  Review your medicines with your doctor. Some medicines can make you feel dizzy. This can increase your chance of falling. Ask your doctor what other things that you can do to help prevent falls. This information is not intended to replace advice given to you by your health care provider. Make sure you discuss any questions you have with your health care provider. Document Released: 03/07/2009 Document Revised: 10/17/2015 Document Reviewed: 06/15/2014 Elsevier Interactive Patient Education  [employer].

## 2018-02-01 DIAGNOSIS — R41841 Cognitive communication deficit: Secondary | ICD-10-CM | POA: Diagnosis not present

## 2018-02-01 DIAGNOSIS — R2681 Unsteadiness on feet: Secondary | ICD-10-CM | POA: Diagnosis not present

## 2018-02-01 DIAGNOSIS — M6281 Muscle weakness (generalized): Secondary | ICD-10-CM | POA: Diagnosis not present

## 2018-02-01 DIAGNOSIS — R1312 Dysphagia, oropharyngeal phase: Secondary | ICD-10-CM | POA: Diagnosis not present

## 2018-02-01 DIAGNOSIS — I69391 Dysphagia following cerebral infarction: Secondary | ICD-10-CM | POA: Diagnosis not present

## 2018-02-01 DIAGNOSIS — R278 Other lack of coordination: Secondary | ICD-10-CM | POA: Diagnosis not present

## 2018-02-02 DIAGNOSIS — M6281 Muscle weakness (generalized): Secondary | ICD-10-CM | POA: Diagnosis not present

## 2018-02-02 DIAGNOSIS — R278 Other lack of coordination: Secondary | ICD-10-CM | POA: Diagnosis not present

## 2018-02-02 DIAGNOSIS — R1312 Dysphagia, oropharyngeal phase: Secondary | ICD-10-CM | POA: Diagnosis not present

## 2018-02-02 DIAGNOSIS — I69391 Dysphagia following cerebral infarction: Secondary | ICD-10-CM | POA: Diagnosis not present

## 2018-02-02 DIAGNOSIS — R41841 Cognitive communication deficit: Secondary | ICD-10-CM | POA: Diagnosis not present

## 2018-02-02 DIAGNOSIS — R2681 Unsteadiness on feet: Secondary | ICD-10-CM | POA: Diagnosis not present

## 2018-02-03 DIAGNOSIS — R1312 Dysphagia, oropharyngeal phase: Secondary | ICD-10-CM | POA: Diagnosis not present

## 2018-02-03 DIAGNOSIS — M6281 Muscle weakness (generalized): Secondary | ICD-10-CM | POA: Diagnosis not present

## 2018-02-03 DIAGNOSIS — R2681 Unsteadiness on feet: Secondary | ICD-10-CM | POA: Diagnosis not present

## 2018-02-03 DIAGNOSIS — R41841 Cognitive communication deficit: Secondary | ICD-10-CM | POA: Diagnosis not present

## 2018-02-03 DIAGNOSIS — I69391 Dysphagia following cerebral infarction: Secondary | ICD-10-CM | POA: Diagnosis not present

## 2018-02-03 DIAGNOSIS — R278 Other lack of coordination: Secondary | ICD-10-CM | POA: Diagnosis not present

## 2018-02-04 DIAGNOSIS — I69391 Dysphagia following cerebral infarction: Secondary | ICD-10-CM | POA: Diagnosis not present

## 2018-02-04 DIAGNOSIS — R41841 Cognitive communication deficit: Secondary | ICD-10-CM | POA: Diagnosis not present

## 2018-02-04 DIAGNOSIS — R278 Other lack of coordination: Secondary | ICD-10-CM | POA: Diagnosis not present

## 2018-02-04 DIAGNOSIS — M6281 Muscle weakness (generalized): Secondary | ICD-10-CM | POA: Diagnosis not present

## 2018-02-04 DIAGNOSIS — R1312 Dysphagia, oropharyngeal phase: Secondary | ICD-10-CM | POA: Diagnosis not present

## 2018-02-04 DIAGNOSIS — R2681 Unsteadiness on feet: Secondary | ICD-10-CM | POA: Diagnosis not present

## 2018-02-07 DIAGNOSIS — I69391 Dysphagia following cerebral infarction: Secondary | ICD-10-CM | POA: Diagnosis not present

## 2018-02-07 DIAGNOSIS — R41841 Cognitive communication deficit: Secondary | ICD-10-CM | POA: Diagnosis not present

## 2018-02-07 DIAGNOSIS — R1312 Dysphagia, oropharyngeal phase: Secondary | ICD-10-CM | POA: Diagnosis not present

## 2018-02-07 DIAGNOSIS — R278 Other lack of coordination: Secondary | ICD-10-CM | POA: Diagnosis not present

## 2018-02-07 DIAGNOSIS — M6281 Muscle weakness (generalized): Secondary | ICD-10-CM | POA: Diagnosis not present

## 2018-02-07 DIAGNOSIS — R2681 Unsteadiness on feet: Secondary | ICD-10-CM | POA: Diagnosis not present

## 2018-02-08 DIAGNOSIS — R278 Other lack of coordination: Secondary | ICD-10-CM | POA: Diagnosis not present

## 2018-02-08 DIAGNOSIS — R1312 Dysphagia, oropharyngeal phase: Secondary | ICD-10-CM | POA: Diagnosis not present

## 2018-02-08 DIAGNOSIS — M6281 Muscle weakness (generalized): Secondary | ICD-10-CM | POA: Diagnosis not present

## 2018-02-08 DIAGNOSIS — R41841 Cognitive communication deficit: Secondary | ICD-10-CM | POA: Diagnosis not present

## 2018-02-08 DIAGNOSIS — I69391 Dysphagia following cerebral infarction: Secondary | ICD-10-CM | POA: Diagnosis not present

## 2018-02-08 DIAGNOSIS — R2681 Unsteadiness on feet: Secondary | ICD-10-CM | POA: Diagnosis not present

## 2018-02-09 DIAGNOSIS — R1312 Dysphagia, oropharyngeal phase: Secondary | ICD-10-CM | POA: Diagnosis not present

## 2018-02-09 DIAGNOSIS — R41841 Cognitive communication deficit: Secondary | ICD-10-CM | POA: Diagnosis not present

## 2018-02-09 DIAGNOSIS — R2681 Unsteadiness on feet: Secondary | ICD-10-CM | POA: Diagnosis not present

## 2018-02-09 DIAGNOSIS — I69391 Dysphagia following cerebral infarction: Secondary | ICD-10-CM | POA: Diagnosis not present

## 2018-02-09 DIAGNOSIS — M6281 Muscle weakness (generalized): Secondary | ICD-10-CM | POA: Diagnosis not present

## 2018-02-09 DIAGNOSIS — R278 Other lack of coordination: Secondary | ICD-10-CM | POA: Diagnosis not present

## 2018-02-11 DIAGNOSIS — R2681 Unsteadiness on feet: Secondary | ICD-10-CM | POA: Diagnosis not present

## 2018-02-11 DIAGNOSIS — I69391 Dysphagia following cerebral infarction: Secondary | ICD-10-CM | POA: Diagnosis not present

## 2018-02-11 DIAGNOSIS — R41841 Cognitive communication deficit: Secondary | ICD-10-CM | POA: Diagnosis not present

## 2018-02-11 DIAGNOSIS — R278 Other lack of coordination: Secondary | ICD-10-CM | POA: Diagnosis not present

## 2018-02-11 DIAGNOSIS — M6281 Muscle weakness (generalized): Secondary | ICD-10-CM | POA: Diagnosis not present

## 2018-02-11 DIAGNOSIS — R1312 Dysphagia, oropharyngeal phase: Secondary | ICD-10-CM | POA: Diagnosis not present

## 2018-02-14 DIAGNOSIS — R41841 Cognitive communication deficit: Secondary | ICD-10-CM | POA: Diagnosis not present

## 2018-02-14 DIAGNOSIS — M6281 Muscle weakness (generalized): Secondary | ICD-10-CM | POA: Diagnosis not present

## 2018-02-14 DIAGNOSIS — I69391 Dysphagia following cerebral infarction: Secondary | ICD-10-CM | POA: Diagnosis not present

## 2018-02-14 DIAGNOSIS — R278 Other lack of coordination: Secondary | ICD-10-CM | POA: Diagnosis not present

## 2018-02-14 DIAGNOSIS — R2681 Unsteadiness on feet: Secondary | ICD-10-CM | POA: Diagnosis not present

## 2018-02-14 DIAGNOSIS — R1312 Dysphagia, oropharyngeal phase: Secondary | ICD-10-CM | POA: Diagnosis not present

## 2018-02-15 ENCOUNTER — Other Ambulatory Visit: Payer: Self-pay | Admitting: Cardiovascular Disease

## 2018-02-15 DIAGNOSIS — R2681 Unsteadiness on feet: Secondary | ICD-10-CM | POA: Diagnosis not present

## 2018-02-15 DIAGNOSIS — M6281 Muscle weakness (generalized): Secondary | ICD-10-CM | POA: Diagnosis not present

## 2018-02-15 DIAGNOSIS — R41841 Cognitive communication deficit: Secondary | ICD-10-CM | POA: Diagnosis not present

## 2018-02-15 DIAGNOSIS — R278 Other lack of coordination: Secondary | ICD-10-CM | POA: Diagnosis not present

## 2018-02-15 DIAGNOSIS — R1312 Dysphagia, oropharyngeal phase: Secondary | ICD-10-CM | POA: Diagnosis not present

## 2018-02-15 DIAGNOSIS — I69391 Dysphagia following cerebral infarction: Secondary | ICD-10-CM | POA: Diagnosis not present

## 2018-02-16 DIAGNOSIS — R1312 Dysphagia, oropharyngeal phase: Secondary | ICD-10-CM | POA: Diagnosis not present

## 2018-02-16 DIAGNOSIS — R41841 Cognitive communication deficit: Secondary | ICD-10-CM | POA: Diagnosis not present

## 2018-02-16 DIAGNOSIS — R2681 Unsteadiness on feet: Secondary | ICD-10-CM | POA: Diagnosis not present

## 2018-02-16 DIAGNOSIS — I69391 Dysphagia following cerebral infarction: Secondary | ICD-10-CM | POA: Diagnosis not present

## 2018-02-16 DIAGNOSIS — M6281 Muscle weakness (generalized): Secondary | ICD-10-CM | POA: Diagnosis not present

## 2018-02-16 DIAGNOSIS — R278 Other lack of coordination: Secondary | ICD-10-CM | POA: Diagnosis not present

## 2018-02-17 DIAGNOSIS — R41841 Cognitive communication deficit: Secondary | ICD-10-CM | POA: Diagnosis not present

## 2018-02-17 DIAGNOSIS — R278 Other lack of coordination: Secondary | ICD-10-CM | POA: Diagnosis not present

## 2018-02-17 DIAGNOSIS — M6281 Muscle weakness (generalized): Secondary | ICD-10-CM | POA: Diagnosis not present

## 2018-02-17 DIAGNOSIS — R1312 Dysphagia, oropharyngeal phase: Secondary | ICD-10-CM | POA: Diagnosis not present

## 2018-02-17 DIAGNOSIS — I69391 Dysphagia following cerebral infarction: Secondary | ICD-10-CM | POA: Diagnosis not present

## 2018-02-17 DIAGNOSIS — R2681 Unsteadiness on feet: Secondary | ICD-10-CM | POA: Diagnosis not present

## 2018-02-18 DIAGNOSIS — R1312 Dysphagia, oropharyngeal phase: Secondary | ICD-10-CM | POA: Diagnosis not present

## 2018-02-18 DIAGNOSIS — I69391 Dysphagia following cerebral infarction: Secondary | ICD-10-CM | POA: Diagnosis not present

## 2018-02-18 DIAGNOSIS — M6281 Muscle weakness (generalized): Secondary | ICD-10-CM | POA: Diagnosis not present

## 2018-02-18 DIAGNOSIS — R2681 Unsteadiness on feet: Secondary | ICD-10-CM | POA: Diagnosis not present

## 2018-02-18 DIAGNOSIS — R278 Other lack of coordination: Secondary | ICD-10-CM | POA: Diagnosis not present

## 2018-02-18 DIAGNOSIS — R41841 Cognitive communication deficit: Secondary | ICD-10-CM | POA: Diagnosis not present

## 2018-02-21 DIAGNOSIS — R1312 Dysphagia, oropharyngeal phase: Secondary | ICD-10-CM | POA: Diagnosis not present

## 2018-02-21 DIAGNOSIS — R41841 Cognitive communication deficit: Secondary | ICD-10-CM | POA: Diagnosis not present

## 2018-02-21 DIAGNOSIS — I69391 Dysphagia following cerebral infarction: Secondary | ICD-10-CM | POA: Diagnosis not present

## 2018-02-21 DIAGNOSIS — M6281 Muscle weakness (generalized): Secondary | ICD-10-CM | POA: Diagnosis not present

## 2018-02-21 DIAGNOSIS — R2681 Unsteadiness on feet: Secondary | ICD-10-CM | POA: Diagnosis not present

## 2018-02-21 DIAGNOSIS — R278 Other lack of coordination: Secondary | ICD-10-CM | POA: Diagnosis not present

## 2018-02-22 DIAGNOSIS — R278 Other lack of coordination: Secondary | ICD-10-CM | POA: Diagnosis not present

## 2018-02-22 DIAGNOSIS — R296 Repeated falls: Secondary | ICD-10-CM | POA: Diagnosis not present

## 2018-02-22 DIAGNOSIS — R2681 Unsteadiness on feet: Secondary | ICD-10-CM | POA: Diagnosis not present

## 2018-02-22 DIAGNOSIS — I69391 Dysphagia following cerebral infarction: Secondary | ICD-10-CM | POA: Diagnosis not present

## 2018-02-22 DIAGNOSIS — R41841 Cognitive communication deficit: Secondary | ICD-10-CM | POA: Diagnosis not present

## 2018-02-22 DIAGNOSIS — R4701 Aphasia: Secondary | ICD-10-CM | POA: Diagnosis not present

## 2018-02-22 DIAGNOSIS — R399 Unspecified symptoms and signs involving the genitourinary system: Secondary | ICD-10-CM | POA: Diagnosis not present

## 2018-02-22 DIAGNOSIS — M6281 Muscle weakness (generalized): Secondary | ICD-10-CM | POA: Diagnosis not present

## 2018-02-22 DIAGNOSIS — R1312 Dysphagia, oropharyngeal phase: Secondary | ICD-10-CM | POA: Diagnosis not present

## 2018-02-23 DIAGNOSIS — R278 Other lack of coordination: Secondary | ICD-10-CM | POA: Diagnosis not present

## 2018-02-23 DIAGNOSIS — R2681 Unsteadiness on feet: Secondary | ICD-10-CM | POA: Diagnosis not present

## 2018-02-23 DIAGNOSIS — M6281 Muscle weakness (generalized): Secondary | ICD-10-CM | POA: Diagnosis not present

## 2018-02-23 DIAGNOSIS — R4701 Aphasia: Secondary | ICD-10-CM | POA: Diagnosis not present

## 2018-02-23 DIAGNOSIS — R1312 Dysphagia, oropharyngeal phase: Secondary | ICD-10-CM | POA: Diagnosis not present

## 2018-02-23 DIAGNOSIS — R41841 Cognitive communication deficit: Secondary | ICD-10-CM | POA: Diagnosis not present

## 2018-02-24 DIAGNOSIS — R4701 Aphasia: Secondary | ICD-10-CM | POA: Diagnosis not present

## 2018-02-24 DIAGNOSIS — R1312 Dysphagia, oropharyngeal phase: Secondary | ICD-10-CM | POA: Diagnosis not present

## 2018-02-24 DIAGNOSIS — R278 Other lack of coordination: Secondary | ICD-10-CM | POA: Diagnosis not present

## 2018-02-24 DIAGNOSIS — R2681 Unsteadiness on feet: Secondary | ICD-10-CM | POA: Diagnosis not present

## 2018-02-24 DIAGNOSIS — Z23 Encounter for immunization: Secondary | ICD-10-CM | POA: Diagnosis not present

## 2018-02-24 DIAGNOSIS — M6281 Muscle weakness (generalized): Secondary | ICD-10-CM | POA: Diagnosis not present

## 2018-02-24 DIAGNOSIS — R41841 Cognitive communication deficit: Secondary | ICD-10-CM | POA: Diagnosis not present

## 2018-02-25 DIAGNOSIS — R1312 Dysphagia, oropharyngeal phase: Secondary | ICD-10-CM | POA: Diagnosis not present

## 2018-02-25 DIAGNOSIS — R2681 Unsteadiness on feet: Secondary | ICD-10-CM | POA: Diagnosis not present

## 2018-02-25 DIAGNOSIS — R41841 Cognitive communication deficit: Secondary | ICD-10-CM | POA: Diagnosis not present

## 2018-02-25 DIAGNOSIS — R278 Other lack of coordination: Secondary | ICD-10-CM | POA: Diagnosis not present

## 2018-02-25 DIAGNOSIS — E039 Hypothyroidism, unspecified: Secondary | ICD-10-CM | POA: Diagnosis not present

## 2018-02-25 DIAGNOSIS — M6281 Muscle weakness (generalized): Secondary | ICD-10-CM | POA: Diagnosis not present

## 2018-02-25 DIAGNOSIS — R4701 Aphasia: Secondary | ICD-10-CM | POA: Diagnosis not present

## 2018-03-01 DIAGNOSIS — R41841 Cognitive communication deficit: Secondary | ICD-10-CM | POA: Diagnosis not present

## 2018-03-01 DIAGNOSIS — N3000 Acute cystitis without hematuria: Secondary | ICD-10-CM | POA: Diagnosis not present

## 2018-03-01 DIAGNOSIS — R410 Disorientation, unspecified: Secondary | ICD-10-CM | POA: Diagnosis not present

## 2018-03-01 DIAGNOSIS — R531 Weakness: Secondary | ICD-10-CM | POA: Diagnosis not present

## 2018-03-01 DIAGNOSIS — R278 Other lack of coordination: Secondary | ICD-10-CM | POA: Diagnosis not present

## 2018-03-01 DIAGNOSIS — R1312 Dysphagia, oropharyngeal phase: Secondary | ICD-10-CM | POA: Diagnosis not present

## 2018-03-01 DIAGNOSIS — R4701 Aphasia: Secondary | ICD-10-CM | POA: Diagnosis not present

## 2018-03-01 DIAGNOSIS — R2681 Unsteadiness on feet: Secondary | ICD-10-CM | POA: Diagnosis not present

## 2018-03-01 DIAGNOSIS — M6281 Muscle weakness (generalized): Secondary | ICD-10-CM | POA: Diagnosis not present

## 2018-03-02 DIAGNOSIS — R4701 Aphasia: Secondary | ICD-10-CM | POA: Diagnosis not present

## 2018-03-02 DIAGNOSIS — R41841 Cognitive communication deficit: Secondary | ICD-10-CM | POA: Diagnosis not present

## 2018-03-02 DIAGNOSIS — M6281 Muscle weakness (generalized): Secondary | ICD-10-CM | POA: Diagnosis not present

## 2018-03-02 DIAGNOSIS — R2681 Unsteadiness on feet: Secondary | ICD-10-CM | POA: Diagnosis not present

## 2018-03-02 DIAGNOSIS — R278 Other lack of coordination: Secondary | ICD-10-CM | POA: Diagnosis not present

## 2018-03-02 DIAGNOSIS — R1312 Dysphagia, oropharyngeal phase: Secondary | ICD-10-CM | POA: Diagnosis not present

## 2018-03-03 DIAGNOSIS — R1312 Dysphagia, oropharyngeal phase: Secondary | ICD-10-CM | POA: Diagnosis not present

## 2018-03-03 DIAGNOSIS — R2681 Unsteadiness on feet: Secondary | ICD-10-CM | POA: Diagnosis not present

## 2018-03-03 DIAGNOSIS — R278 Other lack of coordination: Secondary | ICD-10-CM | POA: Diagnosis not present

## 2018-03-03 DIAGNOSIS — R41841 Cognitive communication deficit: Secondary | ICD-10-CM | POA: Diagnosis not present

## 2018-03-03 DIAGNOSIS — R4701 Aphasia: Secondary | ICD-10-CM | POA: Diagnosis not present

## 2018-03-03 DIAGNOSIS — M6281 Muscle weakness (generalized): Secondary | ICD-10-CM | POA: Diagnosis not present

## 2018-03-04 DIAGNOSIS — R278 Other lack of coordination: Secondary | ICD-10-CM | POA: Diagnosis not present

## 2018-03-04 DIAGNOSIS — R41841 Cognitive communication deficit: Secondary | ICD-10-CM | POA: Diagnosis not present

## 2018-03-04 DIAGNOSIS — R4701 Aphasia: Secondary | ICD-10-CM | POA: Diagnosis not present

## 2018-03-04 DIAGNOSIS — R2681 Unsteadiness on feet: Secondary | ICD-10-CM | POA: Diagnosis not present

## 2018-03-04 DIAGNOSIS — R1312 Dysphagia, oropharyngeal phase: Secondary | ICD-10-CM | POA: Diagnosis not present

## 2018-03-04 DIAGNOSIS — M6281 Muscle weakness (generalized): Secondary | ICD-10-CM | POA: Diagnosis not present

## 2018-03-07 DIAGNOSIS — M6281 Muscle weakness (generalized): Secondary | ICD-10-CM | POA: Diagnosis not present

## 2018-03-07 DIAGNOSIS — R4701 Aphasia: Secondary | ICD-10-CM | POA: Diagnosis not present

## 2018-03-07 DIAGNOSIS — R41841 Cognitive communication deficit: Secondary | ICD-10-CM | POA: Diagnosis not present

## 2018-03-07 DIAGNOSIS — R1312 Dysphagia, oropharyngeal phase: Secondary | ICD-10-CM | POA: Diagnosis not present

## 2018-03-07 DIAGNOSIS — R278 Other lack of coordination: Secondary | ICD-10-CM | POA: Diagnosis not present

## 2018-03-07 DIAGNOSIS — R2681 Unsteadiness on feet: Secondary | ICD-10-CM | POA: Diagnosis not present

## 2018-03-08 DIAGNOSIS — R2681 Unsteadiness on feet: Secondary | ICD-10-CM | POA: Diagnosis not present

## 2018-03-08 DIAGNOSIS — R1312 Dysphagia, oropharyngeal phase: Secondary | ICD-10-CM | POA: Diagnosis not present

## 2018-03-08 DIAGNOSIS — R4701 Aphasia: Secondary | ICD-10-CM | POA: Diagnosis not present

## 2018-03-08 DIAGNOSIS — R41841 Cognitive communication deficit: Secondary | ICD-10-CM | POA: Diagnosis not present

## 2018-03-08 DIAGNOSIS — R278 Other lack of coordination: Secondary | ICD-10-CM | POA: Diagnosis not present

## 2018-03-08 DIAGNOSIS — M6281 Muscle weakness (generalized): Secondary | ICD-10-CM | POA: Diagnosis not present

## 2018-03-09 DIAGNOSIS — R278 Other lack of coordination: Secondary | ICD-10-CM | POA: Diagnosis not present

## 2018-03-09 DIAGNOSIS — R2681 Unsteadiness on feet: Secondary | ICD-10-CM | POA: Diagnosis not present

## 2018-03-09 DIAGNOSIS — R1312 Dysphagia, oropharyngeal phase: Secondary | ICD-10-CM | POA: Diagnosis not present

## 2018-03-09 DIAGNOSIS — R41841 Cognitive communication deficit: Secondary | ICD-10-CM | POA: Diagnosis not present

## 2018-03-09 DIAGNOSIS — R4701 Aphasia: Secondary | ICD-10-CM | POA: Diagnosis not present

## 2018-03-09 DIAGNOSIS — M6281 Muscle weakness (generalized): Secondary | ICD-10-CM | POA: Diagnosis not present

## 2018-03-10 DIAGNOSIS — M6281 Muscle weakness (generalized): Secondary | ICD-10-CM | POA: Diagnosis not present

## 2018-03-10 DIAGNOSIS — R1312 Dysphagia, oropharyngeal phase: Secondary | ICD-10-CM | POA: Diagnosis not present

## 2018-03-10 DIAGNOSIS — R41841 Cognitive communication deficit: Secondary | ICD-10-CM | POA: Diagnosis not present

## 2018-03-10 DIAGNOSIS — R278 Other lack of coordination: Secondary | ICD-10-CM | POA: Diagnosis not present

## 2018-03-10 DIAGNOSIS — R2681 Unsteadiness on feet: Secondary | ICD-10-CM | POA: Diagnosis not present

## 2018-03-10 DIAGNOSIS — R4701 Aphasia: Secondary | ICD-10-CM | POA: Diagnosis not present

## 2018-03-11 DIAGNOSIS — R2681 Unsteadiness on feet: Secondary | ICD-10-CM | POA: Diagnosis not present

## 2018-03-11 DIAGNOSIS — R41841 Cognitive communication deficit: Secondary | ICD-10-CM | POA: Diagnosis not present

## 2018-03-11 DIAGNOSIS — R278 Other lack of coordination: Secondary | ICD-10-CM | POA: Diagnosis not present

## 2018-03-11 DIAGNOSIS — R4701 Aphasia: Secondary | ICD-10-CM | POA: Diagnosis not present

## 2018-03-11 DIAGNOSIS — R1312 Dysphagia, oropharyngeal phase: Secondary | ICD-10-CM | POA: Diagnosis not present

## 2018-03-11 DIAGNOSIS — M6281 Muscle weakness (generalized): Secondary | ICD-10-CM | POA: Diagnosis not present

## 2018-03-14 DIAGNOSIS — R4701 Aphasia: Secondary | ICD-10-CM | POA: Diagnosis not present

## 2018-03-14 DIAGNOSIS — R1312 Dysphagia, oropharyngeal phase: Secondary | ICD-10-CM | POA: Diagnosis not present

## 2018-03-14 DIAGNOSIS — M6281 Muscle weakness (generalized): Secondary | ICD-10-CM | POA: Diagnosis not present

## 2018-03-14 DIAGNOSIS — R278 Other lack of coordination: Secondary | ICD-10-CM | POA: Diagnosis not present

## 2018-03-14 DIAGNOSIS — R41841 Cognitive communication deficit: Secondary | ICD-10-CM | POA: Diagnosis not present

## 2018-03-14 DIAGNOSIS — R2681 Unsteadiness on feet: Secondary | ICD-10-CM | POA: Diagnosis not present

## 2018-03-15 DIAGNOSIS — R2681 Unsteadiness on feet: Secondary | ICD-10-CM | POA: Diagnosis not present

## 2018-03-15 DIAGNOSIS — R1312 Dysphagia, oropharyngeal phase: Secondary | ICD-10-CM | POA: Diagnosis not present

## 2018-03-15 DIAGNOSIS — M6281 Muscle weakness (generalized): Secondary | ICD-10-CM | POA: Diagnosis not present

## 2018-03-15 DIAGNOSIS — R4701 Aphasia: Secondary | ICD-10-CM | POA: Diagnosis not present

## 2018-03-15 DIAGNOSIS — R41841 Cognitive communication deficit: Secondary | ICD-10-CM | POA: Diagnosis not present

## 2018-03-15 DIAGNOSIS — R278 Other lack of coordination: Secondary | ICD-10-CM | POA: Diagnosis not present

## 2018-03-16 DIAGNOSIS — R1312 Dysphagia, oropharyngeal phase: Secondary | ICD-10-CM | POA: Diagnosis not present

## 2018-03-16 DIAGNOSIS — R278 Other lack of coordination: Secondary | ICD-10-CM | POA: Diagnosis not present

## 2018-03-16 DIAGNOSIS — R4701 Aphasia: Secondary | ICD-10-CM | POA: Diagnosis not present

## 2018-03-16 DIAGNOSIS — M6281 Muscle weakness (generalized): Secondary | ICD-10-CM | POA: Diagnosis not present

## 2018-03-16 DIAGNOSIS — R2681 Unsteadiness on feet: Secondary | ICD-10-CM | POA: Diagnosis not present

## 2018-03-16 DIAGNOSIS — R41841 Cognitive communication deficit: Secondary | ICD-10-CM | POA: Diagnosis not present

## 2018-03-17 DIAGNOSIS — R1312 Dysphagia, oropharyngeal phase: Secondary | ICD-10-CM | POA: Diagnosis not present

## 2018-03-17 DIAGNOSIS — R2681 Unsteadiness on feet: Secondary | ICD-10-CM | POA: Diagnosis not present

## 2018-03-17 DIAGNOSIS — R41841 Cognitive communication deficit: Secondary | ICD-10-CM | POA: Diagnosis not present

## 2018-03-17 DIAGNOSIS — R278 Other lack of coordination: Secondary | ICD-10-CM | POA: Diagnosis not present

## 2018-03-17 DIAGNOSIS — M6281 Muscle weakness (generalized): Secondary | ICD-10-CM | POA: Diagnosis not present

## 2018-03-17 DIAGNOSIS — R4701 Aphasia: Secondary | ICD-10-CM | POA: Diagnosis not present

## 2018-03-18 DIAGNOSIS — R41841 Cognitive communication deficit: Secondary | ICD-10-CM | POA: Diagnosis not present

## 2018-03-18 DIAGNOSIS — M6281 Muscle weakness (generalized): Secondary | ICD-10-CM | POA: Diagnosis not present

## 2018-03-18 DIAGNOSIS — R278 Other lack of coordination: Secondary | ICD-10-CM | POA: Diagnosis not present

## 2018-03-18 DIAGNOSIS — R2681 Unsteadiness on feet: Secondary | ICD-10-CM | POA: Diagnosis not present

## 2018-03-18 DIAGNOSIS — R4701 Aphasia: Secondary | ICD-10-CM | POA: Diagnosis not present

## 2018-03-18 DIAGNOSIS — R1312 Dysphagia, oropharyngeal phase: Secondary | ICD-10-CM | POA: Diagnosis not present

## 2018-03-21 DIAGNOSIS — R4701 Aphasia: Secondary | ICD-10-CM | POA: Diagnosis not present

## 2018-03-21 DIAGNOSIS — R1312 Dysphagia, oropharyngeal phase: Secondary | ICD-10-CM | POA: Diagnosis not present

## 2018-03-21 DIAGNOSIS — R41841 Cognitive communication deficit: Secondary | ICD-10-CM | POA: Diagnosis not present

## 2018-03-21 DIAGNOSIS — R2681 Unsteadiness on feet: Secondary | ICD-10-CM | POA: Diagnosis not present

## 2018-03-21 DIAGNOSIS — M6281 Muscle weakness (generalized): Secondary | ICD-10-CM | POA: Diagnosis not present

## 2018-03-21 DIAGNOSIS — R278 Other lack of coordination: Secondary | ICD-10-CM | POA: Diagnosis not present

## 2018-03-22 DIAGNOSIS — M6281 Muscle weakness (generalized): Secondary | ICD-10-CM | POA: Diagnosis not present

## 2018-03-22 DIAGNOSIS — R41841 Cognitive communication deficit: Secondary | ICD-10-CM | POA: Diagnosis not present

## 2018-03-22 DIAGNOSIS — R2681 Unsteadiness on feet: Secondary | ICD-10-CM | POA: Diagnosis not present

## 2018-03-22 DIAGNOSIS — R4701 Aphasia: Secondary | ICD-10-CM | POA: Diagnosis not present

## 2018-03-22 DIAGNOSIS — R1312 Dysphagia, oropharyngeal phase: Secondary | ICD-10-CM | POA: Diagnosis not present

## 2018-03-22 DIAGNOSIS — R278 Other lack of coordination: Secondary | ICD-10-CM | POA: Diagnosis not present

## 2018-03-23 DIAGNOSIS — R278 Other lack of coordination: Secondary | ICD-10-CM | POA: Diagnosis not present

## 2018-03-23 DIAGNOSIS — M6281 Muscle weakness (generalized): Secondary | ICD-10-CM | POA: Diagnosis not present

## 2018-03-23 DIAGNOSIS — R4701 Aphasia: Secondary | ICD-10-CM | POA: Diagnosis not present

## 2018-03-23 DIAGNOSIS — R1312 Dysphagia, oropharyngeal phase: Secondary | ICD-10-CM | POA: Diagnosis not present

## 2018-03-23 DIAGNOSIS — R2681 Unsteadiness on feet: Secondary | ICD-10-CM | POA: Diagnosis not present

## 2018-03-23 DIAGNOSIS — R41841 Cognitive communication deficit: Secondary | ICD-10-CM | POA: Diagnosis not present

## 2018-03-25 DIAGNOSIS — I69391 Dysphagia following cerebral infarction: Secondary | ICD-10-CM | POA: Diagnosis not present

## 2018-03-25 DIAGNOSIS — M6281 Muscle weakness (generalized): Secondary | ICD-10-CM | POA: Diagnosis not present

## 2018-03-25 DIAGNOSIS — R41841 Cognitive communication deficit: Secondary | ICD-10-CM | POA: Diagnosis not present

## 2018-03-25 DIAGNOSIS — R2681 Unsteadiness on feet: Secondary | ICD-10-CM | POA: Diagnosis not present

## 2018-03-25 DIAGNOSIS — R4701 Aphasia: Secondary | ICD-10-CM | POA: Diagnosis not present

## 2018-03-25 DIAGNOSIS — R1312 Dysphagia, oropharyngeal phase: Secondary | ICD-10-CM | POA: Diagnosis not present

## 2018-03-25 DIAGNOSIS — R296 Repeated falls: Secondary | ICD-10-CM | POA: Diagnosis not present

## 2018-03-25 DIAGNOSIS — R278 Other lack of coordination: Secondary | ICD-10-CM | POA: Diagnosis not present

## 2018-03-28 DIAGNOSIS — R1312 Dysphagia, oropharyngeal phase: Secondary | ICD-10-CM | POA: Diagnosis not present

## 2018-03-28 DIAGNOSIS — R41841 Cognitive communication deficit: Secondary | ICD-10-CM | POA: Diagnosis not present

## 2018-03-28 DIAGNOSIS — M6281 Muscle weakness (generalized): Secondary | ICD-10-CM | POA: Diagnosis not present

## 2018-03-28 DIAGNOSIS — R4701 Aphasia: Secondary | ICD-10-CM | POA: Diagnosis not present

## 2018-03-28 DIAGNOSIS — R2681 Unsteadiness on feet: Secondary | ICD-10-CM | POA: Diagnosis not present

## 2018-03-28 DIAGNOSIS — R278 Other lack of coordination: Secondary | ICD-10-CM | POA: Diagnosis not present

## 2018-03-29 DIAGNOSIS — R41841 Cognitive communication deficit: Secondary | ICD-10-CM | POA: Diagnosis not present

## 2018-03-29 DIAGNOSIS — R1312 Dysphagia, oropharyngeal phase: Secondary | ICD-10-CM | POA: Diagnosis not present

## 2018-03-29 DIAGNOSIS — M6281 Muscle weakness (generalized): Secondary | ICD-10-CM | POA: Diagnosis not present

## 2018-03-29 DIAGNOSIS — R2681 Unsteadiness on feet: Secondary | ICD-10-CM | POA: Diagnosis not present

## 2018-03-29 DIAGNOSIS — R278 Other lack of coordination: Secondary | ICD-10-CM | POA: Diagnosis not present

## 2018-03-29 DIAGNOSIS — R4701 Aphasia: Secondary | ICD-10-CM | POA: Diagnosis not present

## 2018-03-30 DIAGNOSIS — M6281 Muscle weakness (generalized): Secondary | ICD-10-CM | POA: Diagnosis not present

## 2018-03-30 DIAGNOSIS — R278 Other lack of coordination: Secondary | ICD-10-CM | POA: Diagnosis not present

## 2018-03-30 DIAGNOSIS — R2681 Unsteadiness on feet: Secondary | ICD-10-CM | POA: Diagnosis not present

## 2018-03-30 DIAGNOSIS — R41841 Cognitive communication deficit: Secondary | ICD-10-CM | POA: Diagnosis not present

## 2018-03-30 DIAGNOSIS — R4701 Aphasia: Secondary | ICD-10-CM | POA: Diagnosis not present

## 2018-03-30 DIAGNOSIS — R1312 Dysphagia, oropharyngeal phase: Secondary | ICD-10-CM | POA: Diagnosis not present

## 2018-03-31 DIAGNOSIS — M6281 Muscle weakness (generalized): Secondary | ICD-10-CM | POA: Diagnosis not present

## 2018-03-31 DIAGNOSIS — R1312 Dysphagia, oropharyngeal phase: Secondary | ICD-10-CM | POA: Diagnosis not present

## 2018-03-31 DIAGNOSIS — R2681 Unsteadiness on feet: Secondary | ICD-10-CM | POA: Diagnosis not present

## 2018-03-31 DIAGNOSIS — R4701 Aphasia: Secondary | ICD-10-CM | POA: Diagnosis not present

## 2018-03-31 DIAGNOSIS — R278 Other lack of coordination: Secondary | ICD-10-CM | POA: Diagnosis not present

## 2018-03-31 DIAGNOSIS — R41841 Cognitive communication deficit: Secondary | ICD-10-CM | POA: Diagnosis not present

## 2018-04-01 DIAGNOSIS — R2681 Unsteadiness on feet: Secondary | ICD-10-CM | POA: Diagnosis not present

## 2018-04-01 DIAGNOSIS — R278 Other lack of coordination: Secondary | ICD-10-CM | POA: Diagnosis not present

## 2018-04-01 DIAGNOSIS — M6281 Muscle weakness (generalized): Secondary | ICD-10-CM | POA: Diagnosis not present

## 2018-04-01 DIAGNOSIS — R1312 Dysphagia, oropharyngeal phase: Secondary | ICD-10-CM | POA: Diagnosis not present

## 2018-04-01 DIAGNOSIS — R4701 Aphasia: Secondary | ICD-10-CM | POA: Diagnosis not present

## 2018-04-01 DIAGNOSIS — R41841 Cognitive communication deficit: Secondary | ICD-10-CM | POA: Diagnosis not present

## 2018-04-04 DIAGNOSIS — R278 Other lack of coordination: Secondary | ICD-10-CM | POA: Diagnosis not present

## 2018-04-04 DIAGNOSIS — R41841 Cognitive communication deficit: Secondary | ICD-10-CM | POA: Diagnosis not present

## 2018-04-04 DIAGNOSIS — R4701 Aphasia: Secondary | ICD-10-CM | POA: Diagnosis not present

## 2018-04-04 DIAGNOSIS — R2681 Unsteadiness on feet: Secondary | ICD-10-CM | POA: Diagnosis not present

## 2018-04-04 DIAGNOSIS — R1312 Dysphagia, oropharyngeal phase: Secondary | ICD-10-CM | POA: Diagnosis not present

## 2018-04-04 DIAGNOSIS — M6281 Muscle weakness (generalized): Secondary | ICD-10-CM | POA: Diagnosis not present

## 2018-04-04 DIAGNOSIS — R410 Disorientation, unspecified: Secondary | ICD-10-CM | POA: Diagnosis not present

## 2018-04-05 DIAGNOSIS — R2681 Unsteadiness on feet: Secondary | ICD-10-CM | POA: Diagnosis not present

## 2018-04-05 DIAGNOSIS — M6281 Muscle weakness (generalized): Secondary | ICD-10-CM | POA: Diagnosis not present

## 2018-04-05 DIAGNOSIS — R1312 Dysphagia, oropharyngeal phase: Secondary | ICD-10-CM | POA: Diagnosis not present

## 2018-04-05 DIAGNOSIS — R41841 Cognitive communication deficit: Secondary | ICD-10-CM | POA: Diagnosis not present

## 2018-04-05 DIAGNOSIS — R4701 Aphasia: Secondary | ICD-10-CM | POA: Diagnosis not present

## 2018-04-05 DIAGNOSIS — R278 Other lack of coordination: Secondary | ICD-10-CM | POA: Diagnosis not present

## 2018-04-06 DIAGNOSIS — R41841 Cognitive communication deficit: Secondary | ICD-10-CM | POA: Diagnosis not present

## 2018-04-06 DIAGNOSIS — M6281 Muscle weakness (generalized): Secondary | ICD-10-CM | POA: Diagnosis not present

## 2018-04-06 DIAGNOSIS — R2681 Unsteadiness on feet: Secondary | ICD-10-CM | POA: Diagnosis not present

## 2018-04-06 DIAGNOSIS — R4701 Aphasia: Secondary | ICD-10-CM | POA: Diagnosis not present

## 2018-04-06 DIAGNOSIS — R278 Other lack of coordination: Secondary | ICD-10-CM | POA: Diagnosis not present

## 2018-04-06 DIAGNOSIS — R1312 Dysphagia, oropharyngeal phase: Secondary | ICD-10-CM | POA: Diagnosis not present

## 2018-04-07 DIAGNOSIS — R4701 Aphasia: Secondary | ICD-10-CM | POA: Diagnosis not present

## 2018-04-07 DIAGNOSIS — M6281 Muscle weakness (generalized): Secondary | ICD-10-CM | POA: Diagnosis not present

## 2018-04-07 DIAGNOSIS — R41841 Cognitive communication deficit: Secondary | ICD-10-CM | POA: Diagnosis not present

## 2018-04-07 DIAGNOSIS — R278 Other lack of coordination: Secondary | ICD-10-CM | POA: Diagnosis not present

## 2018-04-07 DIAGNOSIS — R1312 Dysphagia, oropharyngeal phase: Secondary | ICD-10-CM | POA: Diagnosis not present

## 2018-04-07 DIAGNOSIS — R2681 Unsteadiness on feet: Secondary | ICD-10-CM | POA: Diagnosis not present

## 2018-04-08 DIAGNOSIS — R4701 Aphasia: Secondary | ICD-10-CM | POA: Diagnosis not present

## 2018-04-08 DIAGNOSIS — R1312 Dysphagia, oropharyngeal phase: Secondary | ICD-10-CM | POA: Diagnosis not present

## 2018-04-08 DIAGNOSIS — M6281 Muscle weakness (generalized): Secondary | ICD-10-CM | POA: Diagnosis not present

## 2018-04-08 DIAGNOSIS — R278 Other lack of coordination: Secondary | ICD-10-CM | POA: Diagnosis not present

## 2018-04-08 DIAGNOSIS — R2681 Unsteadiness on feet: Secondary | ICD-10-CM | POA: Diagnosis not present

## 2018-04-08 DIAGNOSIS — R41841 Cognitive communication deficit: Secondary | ICD-10-CM | POA: Diagnosis not present

## 2018-04-11 DIAGNOSIS — R1312 Dysphagia, oropharyngeal phase: Secondary | ICD-10-CM | POA: Diagnosis not present

## 2018-04-11 DIAGNOSIS — M6281 Muscle weakness (generalized): Secondary | ICD-10-CM | POA: Diagnosis not present

## 2018-04-11 DIAGNOSIS — R278 Other lack of coordination: Secondary | ICD-10-CM | POA: Diagnosis not present

## 2018-04-11 DIAGNOSIS — R41841 Cognitive communication deficit: Secondary | ICD-10-CM | POA: Diagnosis not present

## 2018-04-11 DIAGNOSIS — R4701 Aphasia: Secondary | ICD-10-CM | POA: Diagnosis not present

## 2018-04-11 DIAGNOSIS — R2681 Unsteadiness on feet: Secondary | ICD-10-CM | POA: Diagnosis not present

## 2018-04-12 DIAGNOSIS — M6281 Muscle weakness (generalized): Secondary | ICD-10-CM | POA: Diagnosis not present

## 2018-04-12 DIAGNOSIS — R2681 Unsteadiness on feet: Secondary | ICD-10-CM | POA: Diagnosis not present

## 2018-04-12 DIAGNOSIS — R278 Other lack of coordination: Secondary | ICD-10-CM | POA: Diagnosis not present

## 2018-04-12 DIAGNOSIS — R4701 Aphasia: Secondary | ICD-10-CM | POA: Diagnosis not present

## 2018-04-12 DIAGNOSIS — R1312 Dysphagia, oropharyngeal phase: Secondary | ICD-10-CM | POA: Diagnosis not present

## 2018-04-12 DIAGNOSIS — R41841 Cognitive communication deficit: Secondary | ICD-10-CM | POA: Diagnosis not present

## 2018-04-13 DIAGNOSIS — R1312 Dysphagia, oropharyngeal phase: Secondary | ICD-10-CM | POA: Diagnosis not present

## 2018-04-13 DIAGNOSIS — R278 Other lack of coordination: Secondary | ICD-10-CM | POA: Diagnosis not present

## 2018-04-13 DIAGNOSIS — M6281 Muscle weakness (generalized): Secondary | ICD-10-CM | POA: Diagnosis not present

## 2018-04-13 DIAGNOSIS — R41841 Cognitive communication deficit: Secondary | ICD-10-CM | POA: Diagnosis not present

## 2018-04-13 DIAGNOSIS — R4701 Aphasia: Secondary | ICD-10-CM | POA: Diagnosis not present

## 2018-04-13 DIAGNOSIS — R2681 Unsteadiness on feet: Secondary | ICD-10-CM | POA: Diagnosis not present

## 2018-04-14 DIAGNOSIS — R1312 Dysphagia, oropharyngeal phase: Secondary | ICD-10-CM | POA: Diagnosis not present

## 2018-04-14 DIAGNOSIS — R41841 Cognitive communication deficit: Secondary | ICD-10-CM | POA: Diagnosis not present

## 2018-04-14 DIAGNOSIS — M6281 Muscle weakness (generalized): Secondary | ICD-10-CM | POA: Diagnosis not present

## 2018-04-14 DIAGNOSIS — R278 Other lack of coordination: Secondary | ICD-10-CM | POA: Diagnosis not present

## 2018-04-14 DIAGNOSIS — R2681 Unsteadiness on feet: Secondary | ICD-10-CM | POA: Diagnosis not present

## 2018-04-14 DIAGNOSIS — R4701 Aphasia: Secondary | ICD-10-CM | POA: Diagnosis not present

## 2018-04-15 DIAGNOSIS — M6281 Muscle weakness (generalized): Secondary | ICD-10-CM | POA: Diagnosis not present

## 2018-04-15 DIAGNOSIS — R2681 Unsteadiness on feet: Secondary | ICD-10-CM | POA: Diagnosis not present

## 2018-04-15 DIAGNOSIS — R1312 Dysphagia, oropharyngeal phase: Secondary | ICD-10-CM | POA: Diagnosis not present

## 2018-04-15 DIAGNOSIS — R4701 Aphasia: Secondary | ICD-10-CM | POA: Diagnosis not present

## 2018-04-15 DIAGNOSIS — R278 Other lack of coordination: Secondary | ICD-10-CM | POA: Diagnosis not present

## 2018-04-15 DIAGNOSIS — R41841 Cognitive communication deficit: Secondary | ICD-10-CM | POA: Diagnosis not present

## 2018-04-17 DIAGNOSIS — R278 Other lack of coordination: Secondary | ICD-10-CM | POA: Diagnosis not present

## 2018-04-17 DIAGNOSIS — R1312 Dysphagia, oropharyngeal phase: Secondary | ICD-10-CM | POA: Diagnosis not present

## 2018-04-17 DIAGNOSIS — R4701 Aphasia: Secondary | ICD-10-CM | POA: Diagnosis not present

## 2018-04-17 DIAGNOSIS — R41841 Cognitive communication deficit: Secondary | ICD-10-CM | POA: Diagnosis not present

## 2018-04-17 DIAGNOSIS — M6281 Muscle weakness (generalized): Secondary | ICD-10-CM | POA: Diagnosis not present

## 2018-04-17 DIAGNOSIS — R2681 Unsteadiness on feet: Secondary | ICD-10-CM | POA: Diagnosis not present

## 2018-04-18 DIAGNOSIS — M6281 Muscle weakness (generalized): Secondary | ICD-10-CM | POA: Diagnosis not present

## 2018-04-18 DIAGNOSIS — R41841 Cognitive communication deficit: Secondary | ICD-10-CM | POA: Diagnosis not present

## 2018-04-18 DIAGNOSIS — R278 Other lack of coordination: Secondary | ICD-10-CM | POA: Diagnosis not present

## 2018-04-18 DIAGNOSIS — R2681 Unsteadiness on feet: Secondary | ICD-10-CM | POA: Diagnosis not present

## 2018-04-18 DIAGNOSIS — R1312 Dysphagia, oropharyngeal phase: Secondary | ICD-10-CM | POA: Diagnosis not present

## 2018-04-18 DIAGNOSIS — R4701 Aphasia: Secondary | ICD-10-CM | POA: Diagnosis not present

## 2018-04-19 DIAGNOSIS — R4701 Aphasia: Secondary | ICD-10-CM | POA: Diagnosis not present

## 2018-04-19 DIAGNOSIS — R1312 Dysphagia, oropharyngeal phase: Secondary | ICD-10-CM | POA: Diagnosis not present

## 2018-04-19 DIAGNOSIS — R278 Other lack of coordination: Secondary | ICD-10-CM | POA: Diagnosis not present

## 2018-04-19 DIAGNOSIS — M6281 Muscle weakness (generalized): Secondary | ICD-10-CM | POA: Diagnosis not present

## 2018-04-19 DIAGNOSIS — R2681 Unsteadiness on feet: Secondary | ICD-10-CM | POA: Diagnosis not present

## 2018-04-19 DIAGNOSIS — R41841 Cognitive communication deficit: Secondary | ICD-10-CM | POA: Diagnosis not present

## 2018-04-20 DIAGNOSIS — M6281 Muscle weakness (generalized): Secondary | ICD-10-CM | POA: Diagnosis not present

## 2018-04-20 DIAGNOSIS — R278 Other lack of coordination: Secondary | ICD-10-CM | POA: Diagnosis not present

## 2018-04-20 DIAGNOSIS — R1312 Dysphagia, oropharyngeal phase: Secondary | ICD-10-CM | POA: Diagnosis not present

## 2018-04-20 DIAGNOSIS — R4701 Aphasia: Secondary | ICD-10-CM | POA: Diagnosis not present

## 2018-04-20 DIAGNOSIS — R41841 Cognitive communication deficit: Secondary | ICD-10-CM | POA: Diagnosis not present

## 2018-04-20 DIAGNOSIS — R2681 Unsteadiness on feet: Secondary | ICD-10-CM | POA: Diagnosis not present

## 2018-04-22 DIAGNOSIS — M6281 Muscle weakness (generalized): Secondary | ICD-10-CM | POA: Diagnosis not present

## 2018-04-22 DIAGNOSIS — R41841 Cognitive communication deficit: Secondary | ICD-10-CM | POA: Diagnosis not present

## 2018-04-22 DIAGNOSIS — R1312 Dysphagia, oropharyngeal phase: Secondary | ICD-10-CM | POA: Diagnosis not present

## 2018-04-22 DIAGNOSIS — R2681 Unsteadiness on feet: Secondary | ICD-10-CM | POA: Diagnosis not present

## 2018-04-22 DIAGNOSIS — R278 Other lack of coordination: Secondary | ICD-10-CM | POA: Diagnosis not present

## 2018-04-22 DIAGNOSIS — R4701 Aphasia: Secondary | ICD-10-CM | POA: Diagnosis not present

## 2018-04-25 DIAGNOSIS — R4701 Aphasia: Secondary | ICD-10-CM | POA: Diagnosis not present

## 2018-04-25 DIAGNOSIS — R41841 Cognitive communication deficit: Secondary | ICD-10-CM | POA: Diagnosis not present

## 2018-04-25 DIAGNOSIS — R296 Repeated falls: Secondary | ICD-10-CM | POA: Diagnosis not present

## 2018-04-25 DIAGNOSIS — R2681 Unsteadiness on feet: Secondary | ICD-10-CM | POA: Diagnosis not present

## 2018-04-25 DIAGNOSIS — I69391 Dysphagia following cerebral infarction: Secondary | ICD-10-CM | POA: Diagnosis not present

## 2018-04-25 DIAGNOSIS — M6281 Muscle weakness (generalized): Secondary | ICD-10-CM | POA: Diagnosis not present

## 2018-04-25 DIAGNOSIS — R1312 Dysphagia, oropharyngeal phase: Secondary | ICD-10-CM | POA: Diagnosis not present

## 2018-04-25 DIAGNOSIS — R278 Other lack of coordination: Secondary | ICD-10-CM | POA: Diagnosis not present

## 2018-04-26 DIAGNOSIS — R2681 Unsteadiness on feet: Secondary | ICD-10-CM | POA: Diagnosis not present

## 2018-04-26 DIAGNOSIS — R41841 Cognitive communication deficit: Secondary | ICD-10-CM | POA: Diagnosis not present

## 2018-04-26 DIAGNOSIS — R278 Other lack of coordination: Secondary | ICD-10-CM | POA: Diagnosis not present

## 2018-04-26 DIAGNOSIS — R1312 Dysphagia, oropharyngeal phase: Secondary | ICD-10-CM | POA: Diagnosis not present

## 2018-04-26 DIAGNOSIS — R4701 Aphasia: Secondary | ICD-10-CM | POA: Diagnosis not present

## 2018-04-26 DIAGNOSIS — M6281 Muscle weakness (generalized): Secondary | ICD-10-CM | POA: Diagnosis not present

## 2018-04-27 DIAGNOSIS — R2681 Unsteadiness on feet: Secondary | ICD-10-CM | POA: Diagnosis not present

## 2018-04-27 DIAGNOSIS — R278 Other lack of coordination: Secondary | ICD-10-CM | POA: Diagnosis not present

## 2018-04-27 DIAGNOSIS — M6281 Muscle weakness (generalized): Secondary | ICD-10-CM | POA: Diagnosis not present

## 2018-04-27 DIAGNOSIS — R41841 Cognitive communication deficit: Secondary | ICD-10-CM | POA: Diagnosis not present

## 2018-04-27 DIAGNOSIS — R1312 Dysphagia, oropharyngeal phase: Secondary | ICD-10-CM | POA: Diagnosis not present

## 2018-04-27 DIAGNOSIS — R4701 Aphasia: Secondary | ICD-10-CM | POA: Diagnosis not present

## 2018-04-28 DIAGNOSIS — R4701 Aphasia: Secondary | ICD-10-CM | POA: Diagnosis not present

## 2018-04-28 DIAGNOSIS — R41841 Cognitive communication deficit: Secondary | ICD-10-CM | POA: Diagnosis not present

## 2018-04-28 DIAGNOSIS — R1312 Dysphagia, oropharyngeal phase: Secondary | ICD-10-CM | POA: Diagnosis not present

## 2018-04-28 DIAGNOSIS — M6281 Muscle weakness (generalized): Secondary | ICD-10-CM | POA: Diagnosis not present

## 2018-04-28 DIAGNOSIS — R2681 Unsteadiness on feet: Secondary | ICD-10-CM | POA: Diagnosis not present

## 2018-04-28 DIAGNOSIS — R278 Other lack of coordination: Secondary | ICD-10-CM | POA: Diagnosis not present

## 2018-04-29 DIAGNOSIS — R41841 Cognitive communication deficit: Secondary | ICD-10-CM | POA: Diagnosis not present

## 2018-04-29 DIAGNOSIS — M6281 Muscle weakness (generalized): Secondary | ICD-10-CM | POA: Diagnosis not present

## 2018-04-29 DIAGNOSIS — R278 Other lack of coordination: Secondary | ICD-10-CM | POA: Diagnosis not present

## 2018-04-29 DIAGNOSIS — R4701 Aphasia: Secondary | ICD-10-CM | POA: Diagnosis not present

## 2018-04-29 DIAGNOSIS — R1312 Dysphagia, oropharyngeal phase: Secondary | ICD-10-CM | POA: Diagnosis not present

## 2018-04-29 DIAGNOSIS — R2681 Unsteadiness on feet: Secondary | ICD-10-CM | POA: Diagnosis not present

## 2018-04-30 ENCOUNTER — Other Ambulatory Visit: Payer: Self-pay | Admitting: Cardiology

## 2018-05-02 DIAGNOSIS — R2681 Unsteadiness on feet: Secondary | ICD-10-CM | POA: Diagnosis not present

## 2018-05-02 DIAGNOSIS — R1312 Dysphagia, oropharyngeal phase: Secondary | ICD-10-CM | POA: Diagnosis not present

## 2018-05-02 DIAGNOSIS — R41841 Cognitive communication deficit: Secondary | ICD-10-CM | POA: Diagnosis not present

## 2018-05-02 DIAGNOSIS — R4701 Aphasia: Secondary | ICD-10-CM | POA: Diagnosis not present

## 2018-05-02 DIAGNOSIS — R278 Other lack of coordination: Secondary | ICD-10-CM | POA: Diagnosis not present

## 2018-05-02 DIAGNOSIS — M6281 Muscle weakness (generalized): Secondary | ICD-10-CM | POA: Diagnosis not present

## 2018-05-03 DIAGNOSIS — R41841 Cognitive communication deficit: Secondary | ICD-10-CM | POA: Diagnosis not present

## 2018-05-03 DIAGNOSIS — R278 Other lack of coordination: Secondary | ICD-10-CM | POA: Diagnosis not present

## 2018-05-03 DIAGNOSIS — R2681 Unsteadiness on feet: Secondary | ICD-10-CM | POA: Diagnosis not present

## 2018-05-03 DIAGNOSIS — R1312 Dysphagia, oropharyngeal phase: Secondary | ICD-10-CM | POA: Diagnosis not present

## 2018-05-03 DIAGNOSIS — M6281 Muscle weakness (generalized): Secondary | ICD-10-CM | POA: Diagnosis not present

## 2018-05-03 DIAGNOSIS — R4701 Aphasia: Secondary | ICD-10-CM | POA: Diagnosis not present

## 2018-05-05 DIAGNOSIS — R2681 Unsteadiness on feet: Secondary | ICD-10-CM | POA: Diagnosis not present

## 2018-05-05 DIAGNOSIS — M6281 Muscle weakness (generalized): Secondary | ICD-10-CM | POA: Diagnosis not present

## 2018-05-05 DIAGNOSIS — R41841 Cognitive communication deficit: Secondary | ICD-10-CM | POA: Diagnosis not present

## 2018-05-05 DIAGNOSIS — R1312 Dysphagia, oropharyngeal phase: Secondary | ICD-10-CM | POA: Diagnosis not present

## 2018-05-05 DIAGNOSIS — R4701 Aphasia: Secondary | ICD-10-CM | POA: Diagnosis not present

## 2018-05-05 DIAGNOSIS — R278 Other lack of coordination: Secondary | ICD-10-CM | POA: Diagnosis not present

## 2018-05-06 DIAGNOSIS — R4701 Aphasia: Secondary | ICD-10-CM | POA: Diagnosis not present

## 2018-05-06 DIAGNOSIS — R2681 Unsteadiness on feet: Secondary | ICD-10-CM | POA: Diagnosis not present

## 2018-05-06 DIAGNOSIS — R1312 Dysphagia, oropharyngeal phase: Secondary | ICD-10-CM | POA: Diagnosis not present

## 2018-05-06 DIAGNOSIS — R278 Other lack of coordination: Secondary | ICD-10-CM | POA: Diagnosis not present

## 2018-05-06 DIAGNOSIS — R41841 Cognitive communication deficit: Secondary | ICD-10-CM | POA: Diagnosis not present

## 2018-05-06 DIAGNOSIS — M6281 Muscle weakness (generalized): Secondary | ICD-10-CM | POA: Diagnosis not present

## 2018-05-09 DIAGNOSIS — R1312 Dysphagia, oropharyngeal phase: Secondary | ICD-10-CM | POA: Diagnosis not present

## 2018-05-09 DIAGNOSIS — R4701 Aphasia: Secondary | ICD-10-CM | POA: Diagnosis not present

## 2018-05-09 DIAGNOSIS — I69391 Dysphagia following cerebral infarction: Secondary | ICD-10-CM | POA: Diagnosis not present

## 2018-05-09 DIAGNOSIS — R41841 Cognitive communication deficit: Secondary | ICD-10-CM | POA: Diagnosis not present

## 2018-05-11 DIAGNOSIS — I69391 Dysphagia following cerebral infarction: Secondary | ICD-10-CM | POA: Diagnosis not present

## 2018-05-11 DIAGNOSIS — R41841 Cognitive communication deficit: Secondary | ICD-10-CM | POA: Diagnosis not present

## 2018-05-11 DIAGNOSIS — R4701 Aphasia: Secondary | ICD-10-CM | POA: Diagnosis not present

## 2018-05-11 DIAGNOSIS — R1312 Dysphagia, oropharyngeal phase: Secondary | ICD-10-CM | POA: Diagnosis not present

## 2018-05-16 DIAGNOSIS — R41841 Cognitive communication deficit: Secondary | ICD-10-CM | POA: Diagnosis not present

## 2018-05-16 DIAGNOSIS — R1312 Dysphagia, oropharyngeal phase: Secondary | ICD-10-CM | POA: Diagnosis not present

## 2018-05-16 DIAGNOSIS — I69391 Dysphagia following cerebral infarction: Secondary | ICD-10-CM | POA: Diagnosis not present

## 2018-05-16 DIAGNOSIS — R4701 Aphasia: Secondary | ICD-10-CM | POA: Diagnosis not present

## 2018-05-23 DIAGNOSIS — R41841 Cognitive communication deficit: Secondary | ICD-10-CM | POA: Diagnosis not present

## 2018-05-23 DIAGNOSIS — R1312 Dysphagia, oropharyngeal phase: Secondary | ICD-10-CM | POA: Diagnosis not present

## 2018-05-23 DIAGNOSIS — R4701 Aphasia: Secondary | ICD-10-CM | POA: Diagnosis not present

## 2018-05-23 DIAGNOSIS — I69391 Dysphagia following cerebral infarction: Secondary | ICD-10-CM | POA: Diagnosis not present

## 2018-05-25 DIAGNOSIS — J069 Acute upper respiratory infection, unspecified: Secondary | ICD-10-CM | POA: Diagnosis not present

## 2018-05-30 DIAGNOSIS — I69391 Dysphagia following cerebral infarction: Secondary | ICD-10-CM | POA: Diagnosis not present

## 2018-05-30 DIAGNOSIS — R41841 Cognitive communication deficit: Secondary | ICD-10-CM | POA: Diagnosis not present

## 2018-05-30 DIAGNOSIS — R4701 Aphasia: Secondary | ICD-10-CM | POA: Diagnosis not present

## 2018-05-30 DIAGNOSIS — R1312 Dysphagia, oropharyngeal phase: Secondary | ICD-10-CM | POA: Diagnosis not present

## 2018-06-03 DIAGNOSIS — R41841 Cognitive communication deficit: Secondary | ICD-10-CM | POA: Diagnosis not present

## 2018-06-03 DIAGNOSIS — I69391 Dysphagia following cerebral infarction: Secondary | ICD-10-CM | POA: Diagnosis not present

## 2018-06-03 DIAGNOSIS — R4701 Aphasia: Secondary | ICD-10-CM | POA: Diagnosis not present

## 2018-06-03 DIAGNOSIS — R1312 Dysphagia, oropharyngeal phase: Secondary | ICD-10-CM | POA: Diagnosis not present

## 2018-06-06 DIAGNOSIS — I69391 Dysphagia following cerebral infarction: Secondary | ICD-10-CM | POA: Diagnosis not present

## 2018-06-06 DIAGNOSIS — R4701 Aphasia: Secondary | ICD-10-CM | POA: Diagnosis not present

## 2018-06-06 DIAGNOSIS — R1312 Dysphagia, oropharyngeal phase: Secondary | ICD-10-CM | POA: Diagnosis not present

## 2018-06-06 DIAGNOSIS — R41841 Cognitive communication deficit: Secondary | ICD-10-CM | POA: Diagnosis not present

## 2018-06-10 DIAGNOSIS — R1312 Dysphagia, oropharyngeal phase: Secondary | ICD-10-CM | POA: Diagnosis not present

## 2018-06-10 DIAGNOSIS — I69391 Dysphagia following cerebral infarction: Secondary | ICD-10-CM | POA: Diagnosis not present

## 2018-06-10 DIAGNOSIS — R4701 Aphasia: Secondary | ICD-10-CM | POA: Diagnosis not present

## 2018-06-10 DIAGNOSIS — R41841 Cognitive communication deficit: Secondary | ICD-10-CM | POA: Diagnosis not present

## 2018-06-13 DIAGNOSIS — R4701 Aphasia: Secondary | ICD-10-CM | POA: Diagnosis not present

## 2018-06-13 DIAGNOSIS — R41841 Cognitive communication deficit: Secondary | ICD-10-CM | POA: Diagnosis not present

## 2018-06-13 DIAGNOSIS — R1312 Dysphagia, oropharyngeal phase: Secondary | ICD-10-CM | POA: Diagnosis not present

## 2018-06-13 DIAGNOSIS — I69391 Dysphagia following cerebral infarction: Secondary | ICD-10-CM | POA: Diagnosis not present

## 2018-06-14 IMAGING — CT CT CHEST W/O CM
2 of 3 series · 14 of 36 positions shown, 17 images · non-contrast
Comparison: 06/10/2016 chest radiograph.

CLINICAL DATA: 89 y/o  F; dyspnea.

EXAM:
CT CHEST WITHOUT CONTRAST
TECHNIQUE: Multidetector CT imaging of the chest was performed following the
standard protocol without IV contrast.

[Series 201: chest without, idose (2) · axial · non-contrast · 0.68mm/px · z∈[-147,+95]mm · 11 of 115 slices shown, 14 images]
[im 9/115  mediastinal]
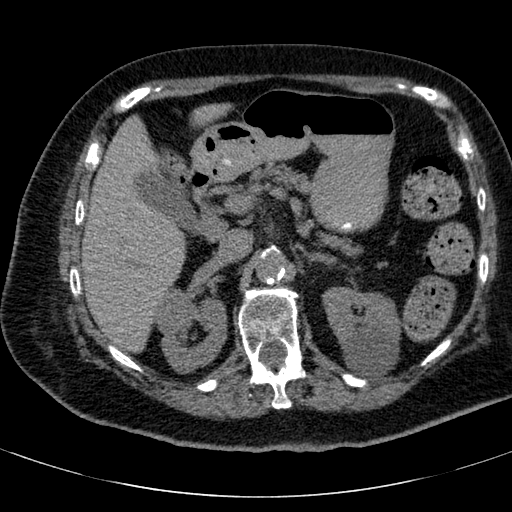
[im 9/115  lung]
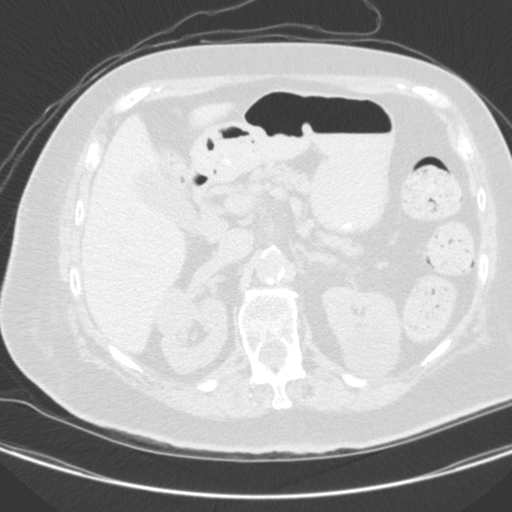
[im 17/115  lung]
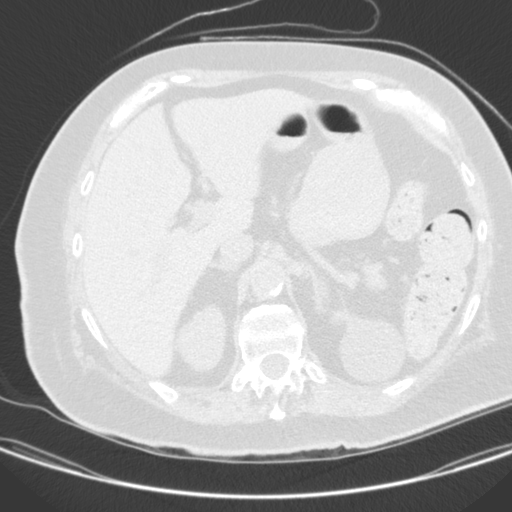
[im 26/115  lung]
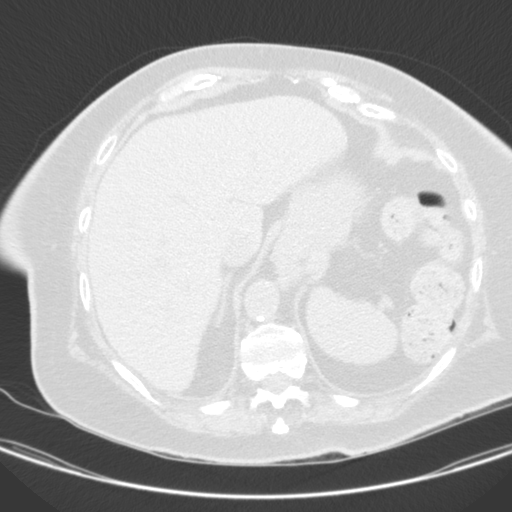
[im 39/115  lung]
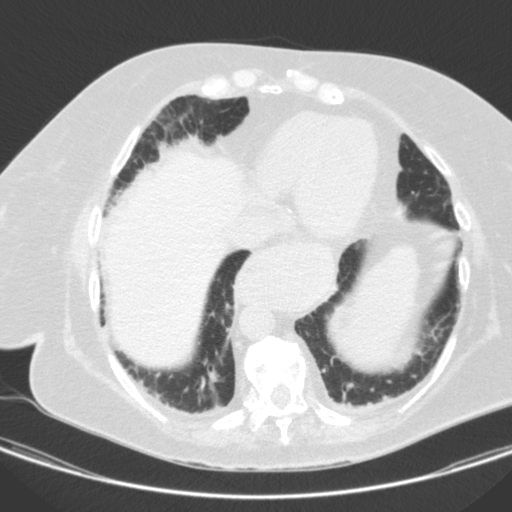
[im 47/115  mediastinal]
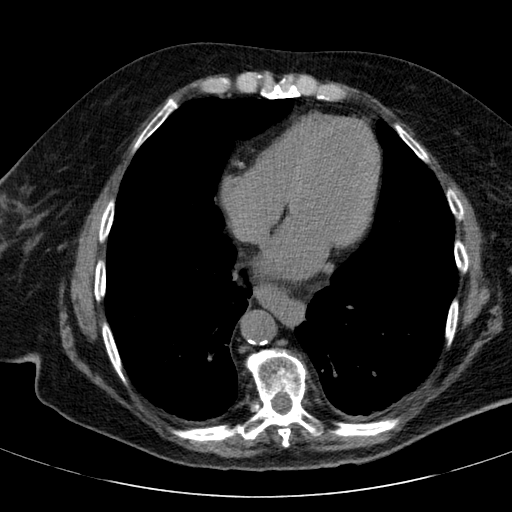
[im 47/115  lung]
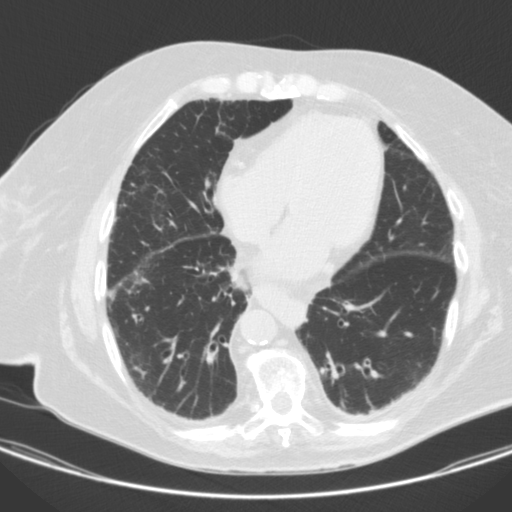
[im 60/115  lung]
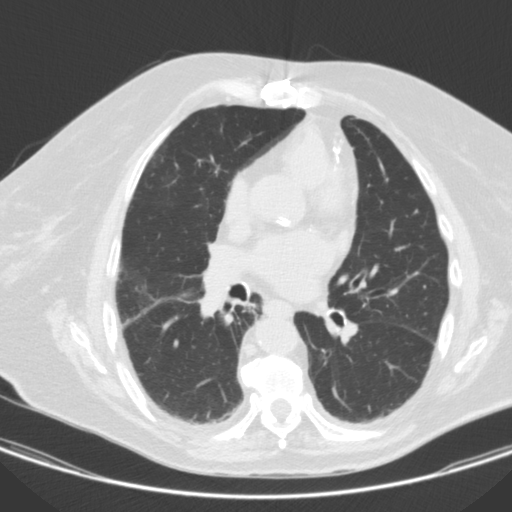
[im 68/115  lung]
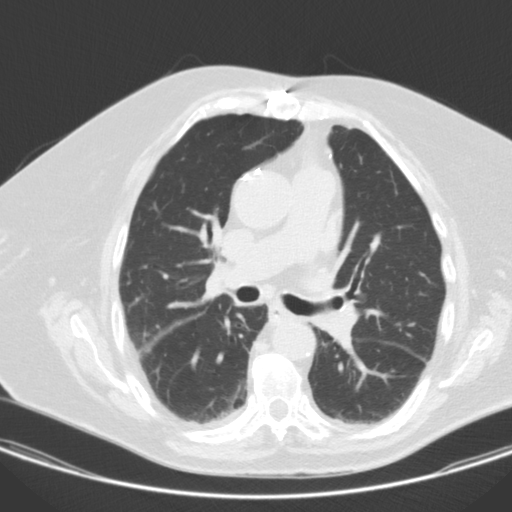
[im 77/115  lung]
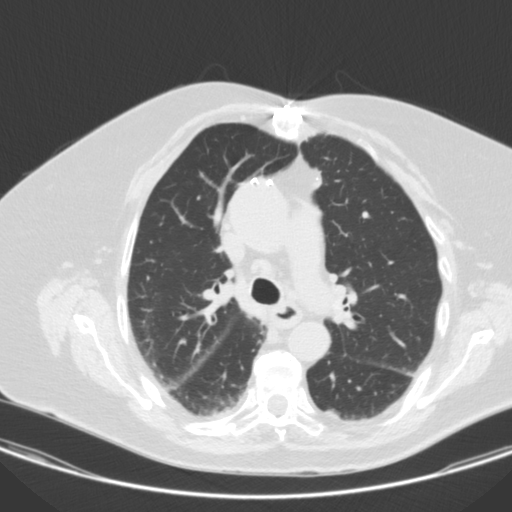
[im 89/115  mediastinal]
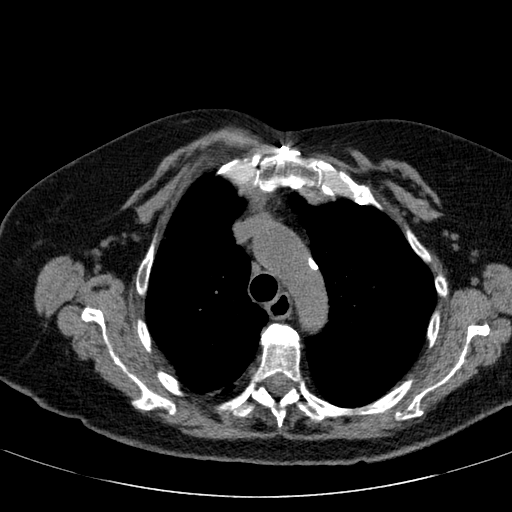
[im 89/115  lung]
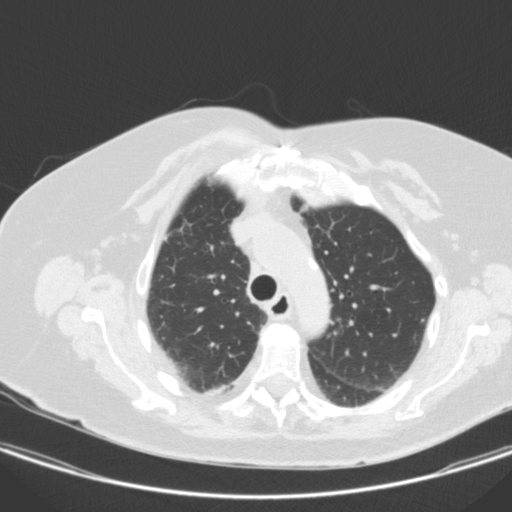
[im 98/115  lung]
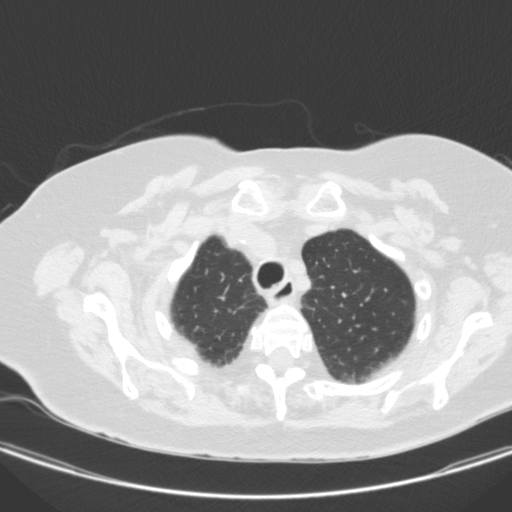
[im 106/115  lung]
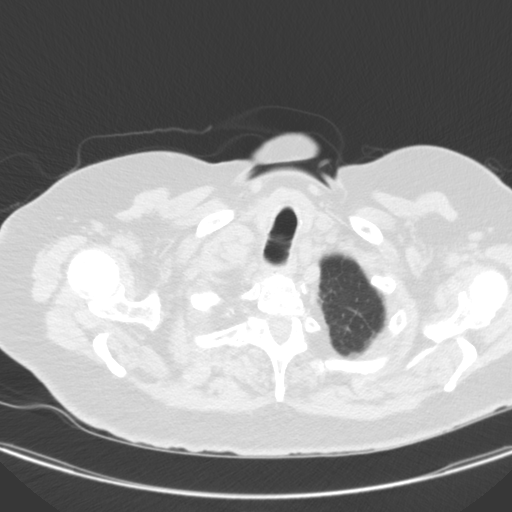

[Series 203: coronal, idose (2) · coronal · 0.45mm/px · 3 of 140 slices shown]
[im 28/140  lung]
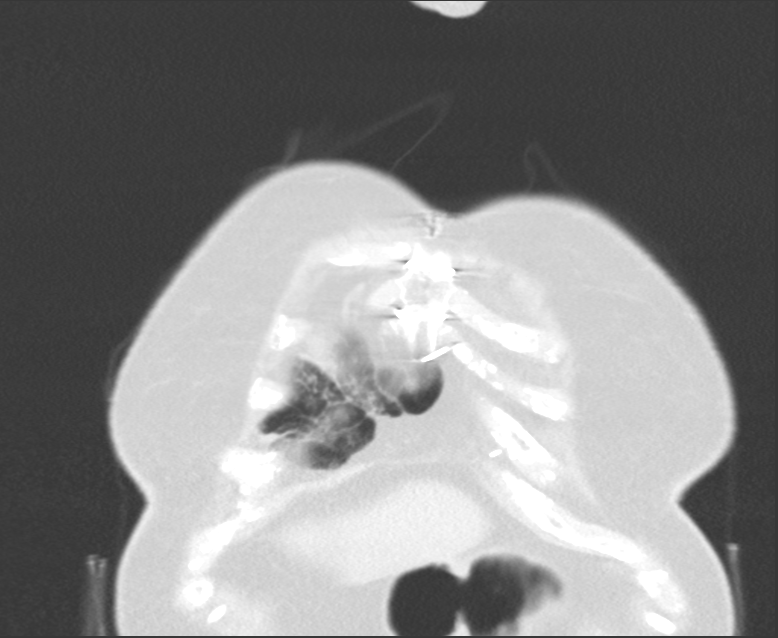
[im 56/140  lung]
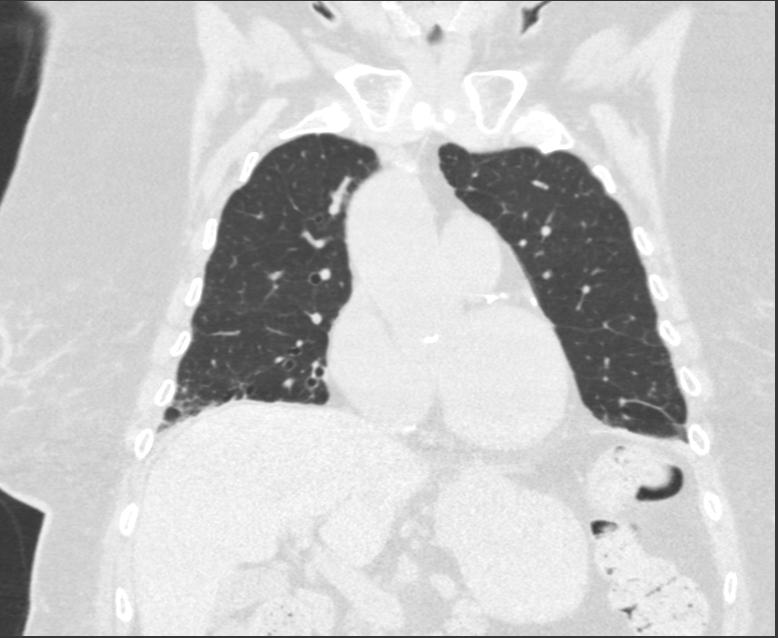
[im 84/140  lung]
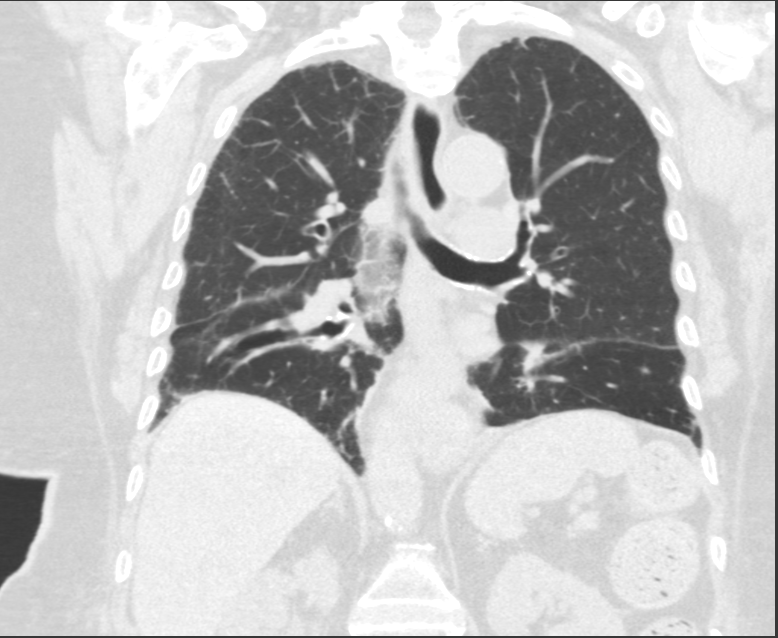

[14 of 36 positions shown; findings below may reference images not displayed]

FINDINGS: Cardiovascular: Normal heart size. No pericardial effusion. Severe
coronary artery calcifications. Aortic valvular calcification
associated with aortic stenosis. Ascending thoracic aorta measures
40 mm. Aortic atherosclerosis with mild calcification.

Mediastinum/Nodes: No lymphadenopathy.  Large hiatal hernia.

Lungs/Pleura: Left upper level punctate calcified granuloma.
Peripheral reticular markings with lung base predominance compatible
with mild pulmonary fibrosis. No honeycombing. No pleural effusion.
No consolidation. Mild bronchiectatic changes at the lung bases and
scattered mucous plugging.

Upper Abdomen: Aortic atherosclerosis. Left upper pole renal cyst
measuring 4.6 cm.

Musculoskeletal: Healed median sternotomy. T10 vertebral body mild
compression deformity post kyphoplasty. Right third anterior
subacute rib fracture with callus.
IMPRESSION: 1. Severe coronary artery calcification.
2. Aortic valvular calcification associated with aortic stenosis.
3. 40 mm ascending thoracic aorta ectasia. Recommend annual imaging
followup by CTA or MRA. This recommendation follows 4484
ACCF/AHA/AATS/ACR/ASA/SCA/HOMAYAN/KARLO/RANFERI/CISSOKO Guidelines for the
Diagnosis and Management of Patients with Thoracic Aortic Disease.
Circulation. 4484; 121: [id number]
4. Mild aortic atherosclerosis.
5. Mild pulmonary fibrosis. Mild basilar bronchiectasis. No
honeycombing.

By: Jan-Phillip Ven M.D.

## 2018-06-27 DIAGNOSIS — R399 Unspecified symptoms and signs involving the genitourinary system: Secondary | ICD-10-CM | POA: Diagnosis not present

## 2018-07-03 IMAGING — CR DG CHEST 2V
2 series · 2 of 2 positions shown · non-contrast
Comparison: Chest x-ray of June 10, 2016 and chest CT scan of
June 12, 2016.

CLINICAL DATA: Onset of cough, wheezing, shortness of breath, and
weakness today. CHF and previous CABG.

EXAM:
CHEST  2 VIEW

[w chest lat *]
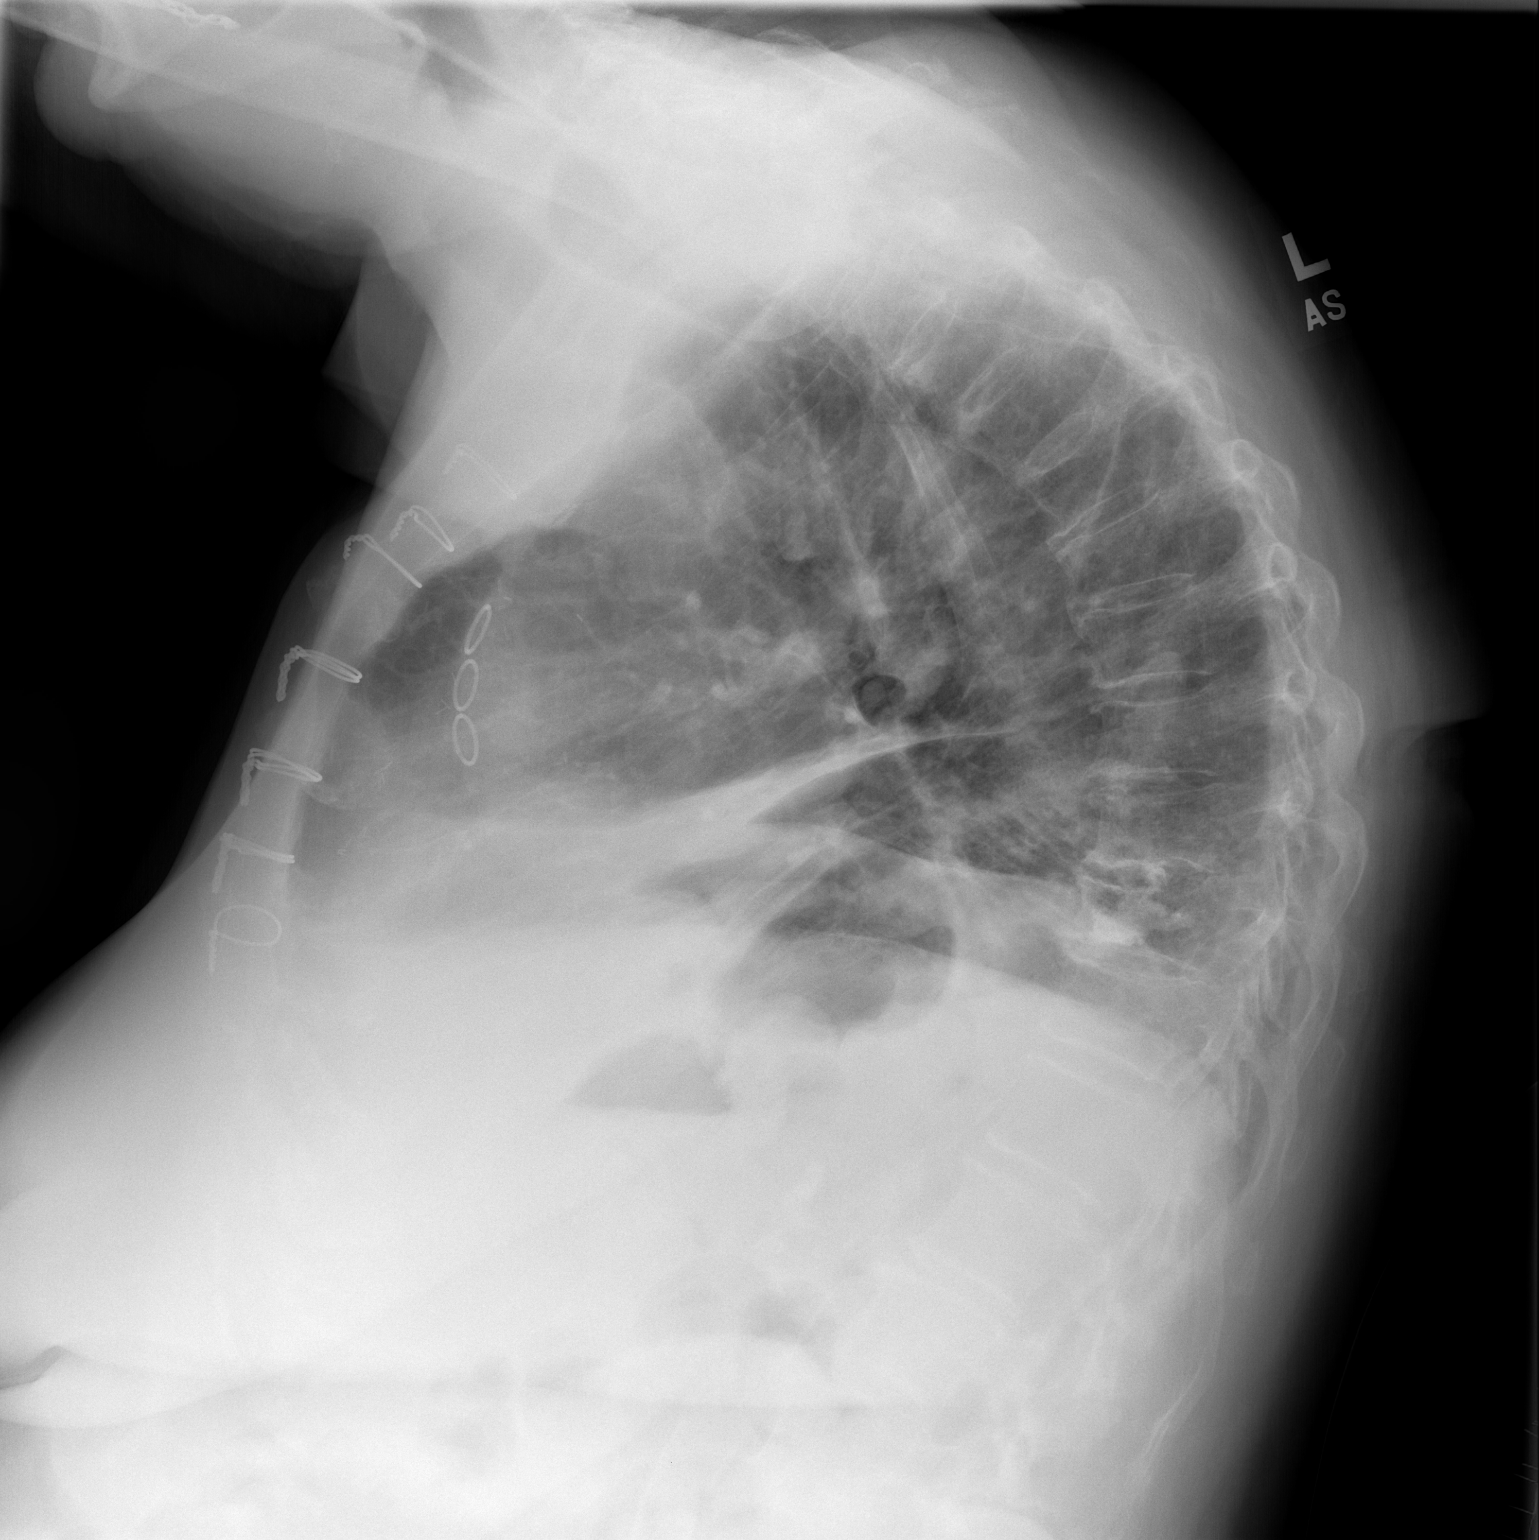

[w chest ap *]
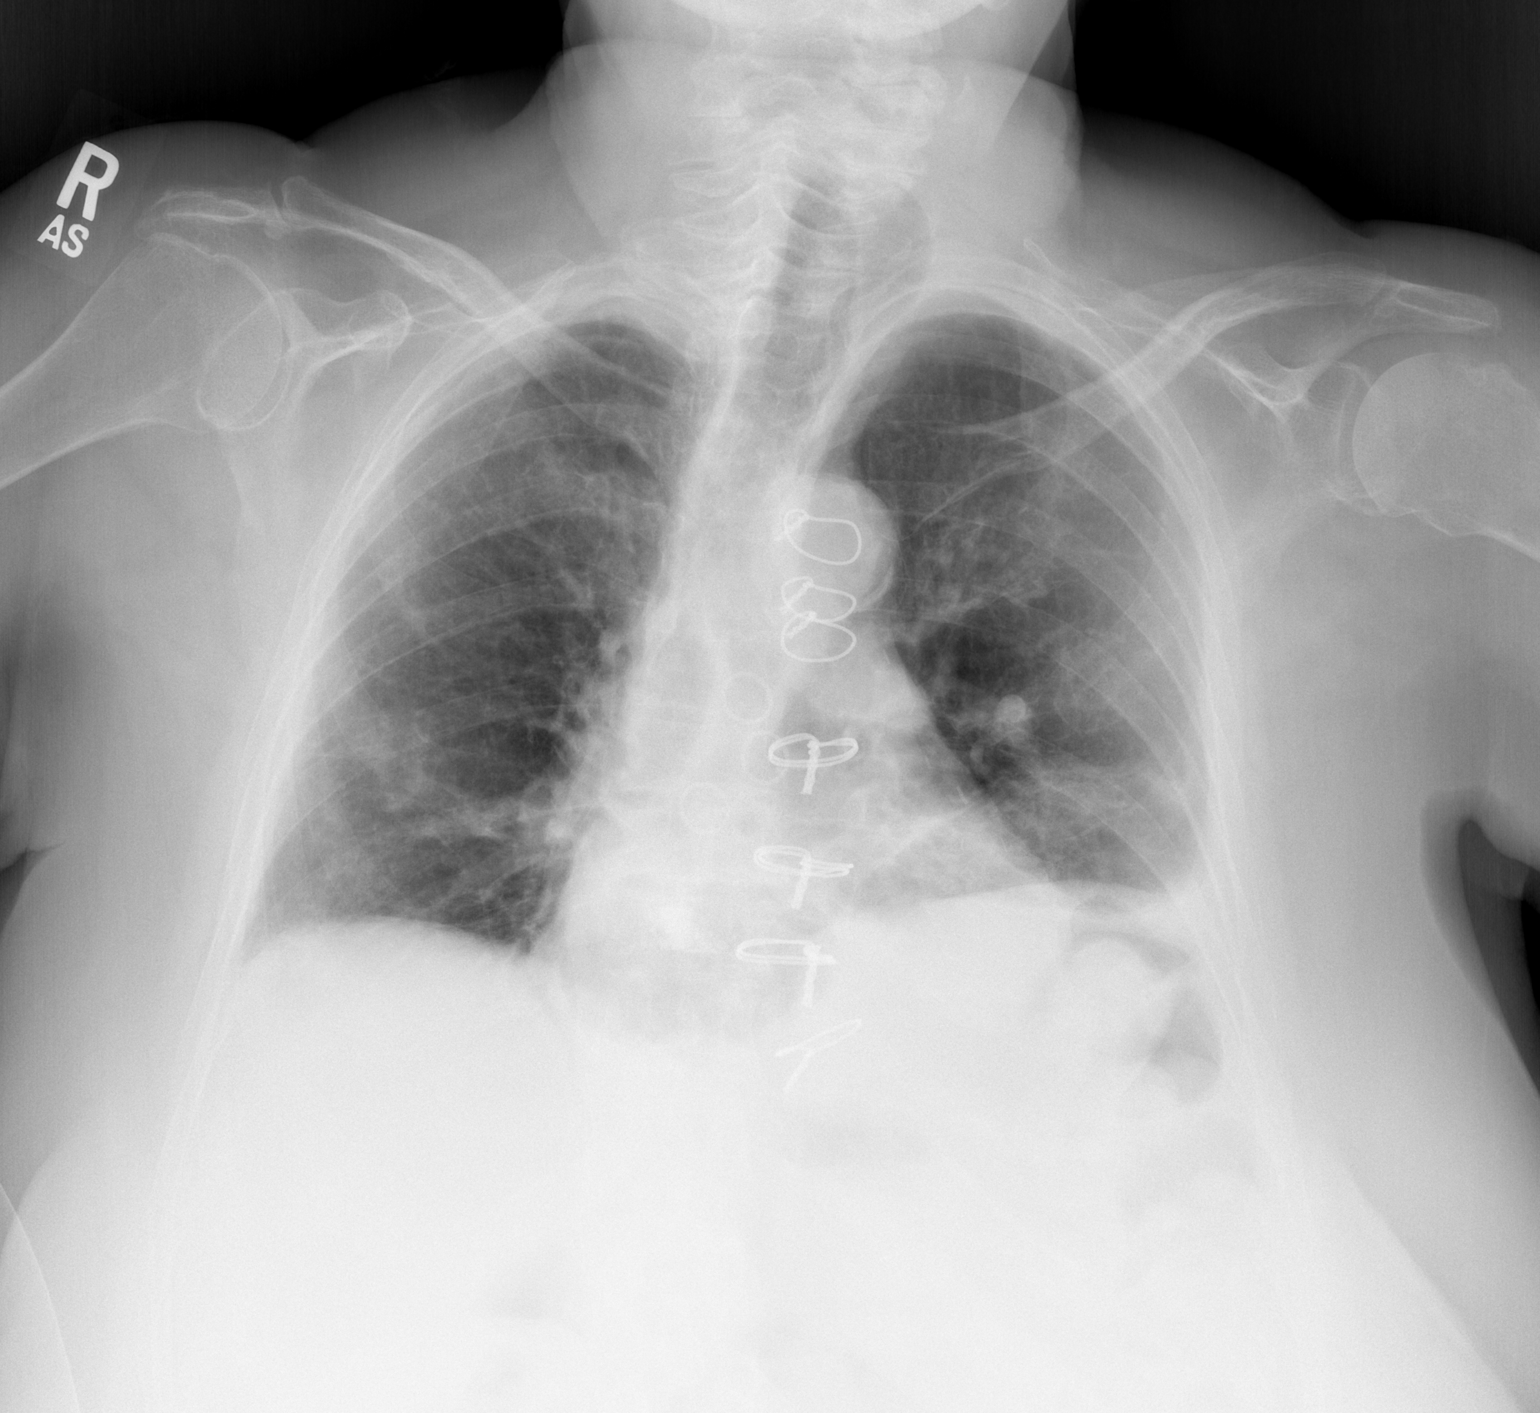

[2 of 2 positions shown; findings below may reference images not displayed]

FINDINGS: The lungs are borderline hypo inflated. There is mildly increased
density noted at the left lung base today which is new. In addition
coarse right infrahilar lung markings have developed. The heart and
pulmonary vascularity are normal. There is calcification in the wall
of the aortic arch. The patient has undergone previous CABG.
IMPRESSION: Interval development of bibasilar atelectasis or pneumonia and small
left pleural effusion. No acute pulmonary edema. Followup PA and
lateral chest X-ray is recommended in 3-4 weeks following trial of
antibiotic therapy to ensure resolution.

Thoracic aortic atherosclerosis.

## 2018-07-07 DIAGNOSIS — I10 Essential (primary) hypertension: Secondary | ICD-10-CM | POA: Diagnosis not present

## 2018-07-07 DIAGNOSIS — F0390 Unspecified dementia without behavioral disturbance: Secondary | ICD-10-CM | POA: Diagnosis not present

## 2018-07-07 DIAGNOSIS — R0600 Dyspnea, unspecified: Secondary | ICD-10-CM | POA: Diagnosis not present

## 2018-07-07 DIAGNOSIS — K219 Gastro-esophageal reflux disease without esophagitis: Secondary | ICD-10-CM | POA: Diagnosis not present

## 2018-07-07 DIAGNOSIS — N390 Urinary tract infection, site not specified: Secondary | ICD-10-CM | POA: Diagnosis not present

## 2018-09-19 DIAGNOSIS — R2689 Other abnormalities of gait and mobility: Secondary | ICD-10-CM | POA: Diagnosis not present

## 2018-09-19 DIAGNOSIS — R278 Other lack of coordination: Secondary | ICD-10-CM | POA: Diagnosis not present

## 2018-09-19 DIAGNOSIS — R2681 Unsteadiness on feet: Secondary | ICD-10-CM | POA: Diagnosis not present

## 2018-09-19 DIAGNOSIS — M6281 Muscle weakness (generalized): Secondary | ICD-10-CM | POA: Diagnosis not present

## 2018-09-20 DIAGNOSIS — R2689 Other abnormalities of gait and mobility: Secondary | ICD-10-CM | POA: Diagnosis not present

## 2018-09-20 DIAGNOSIS — M6281 Muscle weakness (generalized): Secondary | ICD-10-CM | POA: Diagnosis not present

## 2018-09-20 DIAGNOSIS — R2681 Unsteadiness on feet: Secondary | ICD-10-CM | POA: Diagnosis not present

## 2018-09-20 DIAGNOSIS — R278 Other lack of coordination: Secondary | ICD-10-CM | POA: Diagnosis not present

## 2018-09-21 DIAGNOSIS — M6281 Muscle weakness (generalized): Secondary | ICD-10-CM | POA: Diagnosis not present

## 2018-09-21 DIAGNOSIS — R278 Other lack of coordination: Secondary | ICD-10-CM | POA: Diagnosis not present

## 2018-09-21 DIAGNOSIS — R2681 Unsteadiness on feet: Secondary | ICD-10-CM | POA: Diagnosis not present

## 2018-09-21 DIAGNOSIS — R2689 Other abnormalities of gait and mobility: Secondary | ICD-10-CM | POA: Diagnosis not present

## 2018-09-22 DIAGNOSIS — R2681 Unsteadiness on feet: Secondary | ICD-10-CM | POA: Diagnosis not present

## 2018-09-22 DIAGNOSIS — R278 Other lack of coordination: Secondary | ICD-10-CM | POA: Diagnosis not present

## 2018-09-22 DIAGNOSIS — M6281 Muscle weakness (generalized): Secondary | ICD-10-CM | POA: Diagnosis not present

## 2018-09-22 DIAGNOSIS — R2689 Other abnormalities of gait and mobility: Secondary | ICD-10-CM | POA: Diagnosis not present

## 2018-09-24 DIAGNOSIS — R2681 Unsteadiness on feet: Secondary | ICD-10-CM | POA: Diagnosis not present

## 2018-09-24 DIAGNOSIS — R1312 Dysphagia, oropharyngeal phase: Secondary | ICD-10-CM | POA: Diagnosis not present

## 2018-09-24 DIAGNOSIS — R41841 Cognitive communication deficit: Secondary | ICD-10-CM | POA: Diagnosis not present

## 2018-09-24 DIAGNOSIS — R278 Other lack of coordination: Secondary | ICD-10-CM | POA: Diagnosis not present

## 2018-09-24 DIAGNOSIS — R488 Other symbolic dysfunctions: Secondary | ICD-10-CM | POA: Diagnosis not present

## 2018-09-24 DIAGNOSIS — M6281 Muscle weakness (generalized): Secondary | ICD-10-CM | POA: Diagnosis not present

## 2018-09-24 DIAGNOSIS — R1310 Dysphagia, unspecified: Secondary | ICD-10-CM | POA: Diagnosis not present

## 2018-09-24 DIAGNOSIS — R2689 Other abnormalities of gait and mobility: Secondary | ICD-10-CM | POA: Diagnosis not present

## 2018-09-26 DIAGNOSIS — M6281 Muscle weakness (generalized): Secondary | ICD-10-CM | POA: Diagnosis not present

## 2018-09-26 DIAGNOSIS — R2689 Other abnormalities of gait and mobility: Secondary | ICD-10-CM | POA: Diagnosis not present

## 2018-09-26 DIAGNOSIS — R41841 Cognitive communication deficit: Secondary | ICD-10-CM | POA: Diagnosis not present

## 2018-09-26 DIAGNOSIS — R1310 Dysphagia, unspecified: Secondary | ICD-10-CM | POA: Diagnosis not present

## 2018-09-26 DIAGNOSIS — R278 Other lack of coordination: Secondary | ICD-10-CM | POA: Diagnosis not present

## 2018-09-26 DIAGNOSIS — R1312 Dysphagia, oropharyngeal phase: Secondary | ICD-10-CM | POA: Diagnosis not present

## 2018-09-27 DIAGNOSIS — R1310 Dysphagia, unspecified: Secondary | ICD-10-CM | POA: Diagnosis not present

## 2018-09-27 DIAGNOSIS — R41841 Cognitive communication deficit: Secondary | ICD-10-CM | POA: Diagnosis not present

## 2018-09-27 DIAGNOSIS — R1312 Dysphagia, oropharyngeal phase: Secondary | ICD-10-CM | POA: Diagnosis not present

## 2018-09-27 DIAGNOSIS — R2689 Other abnormalities of gait and mobility: Secondary | ICD-10-CM | POA: Diagnosis not present

## 2018-09-27 DIAGNOSIS — R278 Other lack of coordination: Secondary | ICD-10-CM | POA: Diagnosis not present

## 2018-09-27 DIAGNOSIS — M6281 Muscle weakness (generalized): Secondary | ICD-10-CM | POA: Diagnosis not present

## 2018-09-28 DIAGNOSIS — M6281 Muscle weakness (generalized): Secondary | ICD-10-CM | POA: Diagnosis not present

## 2018-09-28 DIAGNOSIS — R278 Other lack of coordination: Secondary | ICD-10-CM | POA: Diagnosis not present

## 2018-09-28 DIAGNOSIS — R1312 Dysphagia, oropharyngeal phase: Secondary | ICD-10-CM | POA: Diagnosis not present

## 2018-09-28 DIAGNOSIS — R41841 Cognitive communication deficit: Secondary | ICD-10-CM | POA: Diagnosis not present

## 2018-09-28 DIAGNOSIS — R1310 Dysphagia, unspecified: Secondary | ICD-10-CM | POA: Diagnosis not present

## 2018-09-28 DIAGNOSIS — R2689 Other abnormalities of gait and mobility: Secondary | ICD-10-CM | POA: Diagnosis not present

## 2018-09-29 DIAGNOSIS — R41841 Cognitive communication deficit: Secondary | ICD-10-CM | POA: Diagnosis not present

## 2018-09-29 DIAGNOSIS — M6281 Muscle weakness (generalized): Secondary | ICD-10-CM | POA: Diagnosis not present

## 2018-09-29 DIAGNOSIS — R278 Other lack of coordination: Secondary | ICD-10-CM | POA: Diagnosis not present

## 2018-09-29 DIAGNOSIS — R2689 Other abnormalities of gait and mobility: Secondary | ICD-10-CM | POA: Diagnosis not present

## 2018-09-29 DIAGNOSIS — R1312 Dysphagia, oropharyngeal phase: Secondary | ICD-10-CM | POA: Diagnosis not present

## 2018-09-29 DIAGNOSIS — R1310 Dysphagia, unspecified: Secondary | ICD-10-CM | POA: Diagnosis not present

## 2018-09-30 DIAGNOSIS — R41841 Cognitive communication deficit: Secondary | ICD-10-CM | POA: Diagnosis not present

## 2018-09-30 DIAGNOSIS — M6281 Muscle weakness (generalized): Secondary | ICD-10-CM | POA: Diagnosis not present

## 2018-09-30 DIAGNOSIS — R1310 Dysphagia, unspecified: Secondary | ICD-10-CM | POA: Diagnosis not present

## 2018-09-30 DIAGNOSIS — R278 Other lack of coordination: Secondary | ICD-10-CM | POA: Diagnosis not present

## 2018-09-30 DIAGNOSIS — R2689 Other abnormalities of gait and mobility: Secondary | ICD-10-CM | POA: Diagnosis not present

## 2018-09-30 DIAGNOSIS — R1312 Dysphagia, oropharyngeal phase: Secondary | ICD-10-CM | POA: Diagnosis not present

## 2018-10-03 DIAGNOSIS — R278 Other lack of coordination: Secondary | ICD-10-CM | POA: Diagnosis not present

## 2018-10-03 DIAGNOSIS — R2689 Other abnormalities of gait and mobility: Secondary | ICD-10-CM | POA: Diagnosis not present

## 2018-10-03 DIAGNOSIS — R1312 Dysphagia, oropharyngeal phase: Secondary | ICD-10-CM | POA: Diagnosis not present

## 2018-10-03 DIAGNOSIS — R1310 Dysphagia, unspecified: Secondary | ICD-10-CM | POA: Diagnosis not present

## 2018-10-03 DIAGNOSIS — R41841 Cognitive communication deficit: Secondary | ICD-10-CM | POA: Diagnosis not present

## 2018-10-03 DIAGNOSIS — M6281 Muscle weakness (generalized): Secondary | ICD-10-CM | POA: Diagnosis not present

## 2018-10-04 DIAGNOSIS — R1312 Dysphagia, oropharyngeal phase: Secondary | ICD-10-CM | POA: Diagnosis not present

## 2018-10-04 DIAGNOSIS — R2689 Other abnormalities of gait and mobility: Secondary | ICD-10-CM | POA: Diagnosis not present

## 2018-10-04 DIAGNOSIS — R41841 Cognitive communication deficit: Secondary | ICD-10-CM | POA: Diagnosis not present

## 2018-10-04 DIAGNOSIS — M6281 Muscle weakness (generalized): Secondary | ICD-10-CM | POA: Diagnosis not present

## 2018-10-04 DIAGNOSIS — R1310 Dysphagia, unspecified: Secondary | ICD-10-CM | POA: Diagnosis not present

## 2018-10-04 DIAGNOSIS — R278 Other lack of coordination: Secondary | ICD-10-CM | POA: Diagnosis not present

## 2018-10-05 DIAGNOSIS — R1310 Dysphagia, unspecified: Secondary | ICD-10-CM | POA: Diagnosis not present

## 2018-10-05 DIAGNOSIS — R278 Other lack of coordination: Secondary | ICD-10-CM | POA: Diagnosis not present

## 2018-10-05 DIAGNOSIS — R2689 Other abnormalities of gait and mobility: Secondary | ICD-10-CM | POA: Diagnosis not present

## 2018-10-05 DIAGNOSIS — R41841 Cognitive communication deficit: Secondary | ICD-10-CM | POA: Diagnosis not present

## 2018-10-05 DIAGNOSIS — R1312 Dysphagia, oropharyngeal phase: Secondary | ICD-10-CM | POA: Diagnosis not present

## 2018-10-05 DIAGNOSIS — M6281 Muscle weakness (generalized): Secondary | ICD-10-CM | POA: Diagnosis not present

## 2018-10-07 DIAGNOSIS — M6281 Muscle weakness (generalized): Secondary | ICD-10-CM | POA: Diagnosis not present

## 2018-10-07 DIAGNOSIS — R278 Other lack of coordination: Secondary | ICD-10-CM | POA: Diagnosis not present

## 2018-10-07 DIAGNOSIS — R2689 Other abnormalities of gait and mobility: Secondary | ICD-10-CM | POA: Diagnosis not present

## 2018-10-07 DIAGNOSIS — R41841 Cognitive communication deficit: Secondary | ICD-10-CM | POA: Diagnosis not present

## 2018-10-07 DIAGNOSIS — R1312 Dysphagia, oropharyngeal phase: Secondary | ICD-10-CM | POA: Diagnosis not present

## 2018-10-07 DIAGNOSIS — R1310 Dysphagia, unspecified: Secondary | ICD-10-CM | POA: Diagnosis not present

## 2018-10-09 DIAGNOSIS — R278 Other lack of coordination: Secondary | ICD-10-CM | POA: Diagnosis not present

## 2018-10-09 DIAGNOSIS — R41841 Cognitive communication deficit: Secondary | ICD-10-CM | POA: Diagnosis not present

## 2018-10-09 DIAGNOSIS — R2689 Other abnormalities of gait and mobility: Secondary | ICD-10-CM | POA: Diagnosis not present

## 2018-10-09 DIAGNOSIS — R1312 Dysphagia, oropharyngeal phase: Secondary | ICD-10-CM | POA: Diagnosis not present

## 2018-10-09 DIAGNOSIS — M6281 Muscle weakness (generalized): Secondary | ICD-10-CM | POA: Diagnosis not present

## 2018-10-09 DIAGNOSIS — R1310 Dysphagia, unspecified: Secondary | ICD-10-CM | POA: Diagnosis not present

## 2018-10-10 DIAGNOSIS — R41841 Cognitive communication deficit: Secondary | ICD-10-CM | POA: Diagnosis not present

## 2018-10-10 DIAGNOSIS — R278 Other lack of coordination: Secondary | ICD-10-CM | POA: Diagnosis not present

## 2018-10-10 DIAGNOSIS — R2689 Other abnormalities of gait and mobility: Secondary | ICD-10-CM | POA: Diagnosis not present

## 2018-10-10 DIAGNOSIS — R1310 Dysphagia, unspecified: Secondary | ICD-10-CM | POA: Diagnosis not present

## 2018-10-10 DIAGNOSIS — M6281 Muscle weakness (generalized): Secondary | ICD-10-CM | POA: Diagnosis not present

## 2018-10-10 DIAGNOSIS — R1312 Dysphagia, oropharyngeal phase: Secondary | ICD-10-CM | POA: Diagnosis not present

## 2018-10-11 DIAGNOSIS — M6281 Muscle weakness (generalized): Secondary | ICD-10-CM | POA: Diagnosis not present

## 2018-10-11 DIAGNOSIS — R2689 Other abnormalities of gait and mobility: Secondary | ICD-10-CM | POA: Diagnosis not present

## 2018-10-11 DIAGNOSIS — R41841 Cognitive communication deficit: Secondary | ICD-10-CM | POA: Diagnosis not present

## 2018-10-11 DIAGNOSIS — R1310 Dysphagia, unspecified: Secondary | ICD-10-CM | POA: Diagnosis not present

## 2018-10-11 DIAGNOSIS — R278 Other lack of coordination: Secondary | ICD-10-CM | POA: Diagnosis not present

## 2018-10-11 DIAGNOSIS — R1312 Dysphagia, oropharyngeal phase: Secondary | ICD-10-CM | POA: Diagnosis not present

## 2018-10-12 DIAGNOSIS — R278 Other lack of coordination: Secondary | ICD-10-CM | POA: Diagnosis not present

## 2018-10-12 DIAGNOSIS — R41841 Cognitive communication deficit: Secondary | ICD-10-CM | POA: Diagnosis not present

## 2018-10-12 DIAGNOSIS — M6281 Muscle weakness (generalized): Secondary | ICD-10-CM | POA: Diagnosis not present

## 2018-10-12 DIAGNOSIS — R1312 Dysphagia, oropharyngeal phase: Secondary | ICD-10-CM | POA: Diagnosis not present

## 2018-10-12 DIAGNOSIS — R1310 Dysphagia, unspecified: Secondary | ICD-10-CM | POA: Diagnosis not present

## 2018-10-12 DIAGNOSIS — R2689 Other abnormalities of gait and mobility: Secondary | ICD-10-CM | POA: Diagnosis not present

## 2018-10-13 DIAGNOSIS — R2689 Other abnormalities of gait and mobility: Secondary | ICD-10-CM | POA: Diagnosis not present

## 2018-10-13 DIAGNOSIS — M6281 Muscle weakness (generalized): Secondary | ICD-10-CM | POA: Diagnosis not present

## 2018-10-13 DIAGNOSIS — R1310 Dysphagia, unspecified: Secondary | ICD-10-CM | POA: Diagnosis not present

## 2018-10-13 DIAGNOSIS — R41841 Cognitive communication deficit: Secondary | ICD-10-CM | POA: Diagnosis not present

## 2018-10-13 DIAGNOSIS — R278 Other lack of coordination: Secondary | ICD-10-CM | POA: Diagnosis not present

## 2018-10-13 DIAGNOSIS — R1312 Dysphagia, oropharyngeal phase: Secondary | ICD-10-CM | POA: Diagnosis not present

## 2018-10-18 DIAGNOSIS — R1310 Dysphagia, unspecified: Secondary | ICD-10-CM | POA: Diagnosis not present

## 2018-10-18 DIAGNOSIS — M6281 Muscle weakness (generalized): Secondary | ICD-10-CM | POA: Diagnosis not present

## 2018-10-18 DIAGNOSIS — R41841 Cognitive communication deficit: Secondary | ICD-10-CM | POA: Diagnosis not present

## 2018-10-18 DIAGNOSIS — R278 Other lack of coordination: Secondary | ICD-10-CM | POA: Diagnosis not present

## 2018-10-18 DIAGNOSIS — R2689 Other abnormalities of gait and mobility: Secondary | ICD-10-CM | POA: Diagnosis not present

## 2018-10-18 DIAGNOSIS — R1312 Dysphagia, oropharyngeal phase: Secondary | ICD-10-CM | POA: Diagnosis not present

## 2018-10-19 DIAGNOSIS — R278 Other lack of coordination: Secondary | ICD-10-CM | POA: Diagnosis not present

## 2018-10-19 DIAGNOSIS — R2689 Other abnormalities of gait and mobility: Secondary | ICD-10-CM | POA: Diagnosis not present

## 2018-10-19 DIAGNOSIS — R41841 Cognitive communication deficit: Secondary | ICD-10-CM | POA: Diagnosis not present

## 2018-10-19 DIAGNOSIS — M6281 Muscle weakness (generalized): Secondary | ICD-10-CM | POA: Diagnosis not present

## 2018-10-19 DIAGNOSIS — R1312 Dysphagia, oropharyngeal phase: Secondary | ICD-10-CM | POA: Diagnosis not present

## 2018-10-19 DIAGNOSIS — R1310 Dysphagia, unspecified: Secondary | ICD-10-CM | POA: Diagnosis not present

## 2018-10-20 DIAGNOSIS — R1312 Dysphagia, oropharyngeal phase: Secondary | ICD-10-CM | POA: Diagnosis not present

## 2018-10-20 DIAGNOSIS — R41841 Cognitive communication deficit: Secondary | ICD-10-CM | POA: Diagnosis not present

## 2018-10-20 DIAGNOSIS — R278 Other lack of coordination: Secondary | ICD-10-CM | POA: Diagnosis not present

## 2018-10-20 DIAGNOSIS — R1310 Dysphagia, unspecified: Secondary | ICD-10-CM | POA: Diagnosis not present

## 2018-10-20 DIAGNOSIS — M6281 Muscle weakness (generalized): Secondary | ICD-10-CM | POA: Diagnosis not present

## 2018-10-20 DIAGNOSIS — R2689 Other abnormalities of gait and mobility: Secondary | ICD-10-CM | POA: Diagnosis not present

## 2018-10-21 DIAGNOSIS — R278 Other lack of coordination: Secondary | ICD-10-CM | POA: Diagnosis not present

## 2018-10-21 DIAGNOSIS — R2689 Other abnormalities of gait and mobility: Secondary | ICD-10-CM | POA: Diagnosis not present

## 2018-10-21 DIAGNOSIS — M6281 Muscle weakness (generalized): Secondary | ICD-10-CM | POA: Diagnosis not present

## 2018-10-21 DIAGNOSIS — R1310 Dysphagia, unspecified: Secondary | ICD-10-CM | POA: Diagnosis not present

## 2018-10-21 DIAGNOSIS — R1312 Dysphagia, oropharyngeal phase: Secondary | ICD-10-CM | POA: Diagnosis not present

## 2018-10-21 DIAGNOSIS — R41841 Cognitive communication deficit: Secondary | ICD-10-CM | POA: Diagnosis not present

## 2018-10-24 DIAGNOSIS — R2681 Unsteadiness on feet: Secondary | ICD-10-CM | POA: Diagnosis not present

## 2018-10-24 DIAGNOSIS — M6281 Muscle weakness (generalized): Secondary | ICD-10-CM | POA: Diagnosis not present

## 2018-10-24 DIAGNOSIS — R278 Other lack of coordination: Secondary | ICD-10-CM | POA: Diagnosis not present

## 2018-10-24 DIAGNOSIS — R2689 Other abnormalities of gait and mobility: Secondary | ICD-10-CM | POA: Diagnosis not present

## 2018-10-25 ENCOUNTER — Other Ambulatory Visit: Payer: Self-pay | Admitting: Cardiovascular Disease

## 2018-10-25 DIAGNOSIS — R2689 Other abnormalities of gait and mobility: Secondary | ICD-10-CM | POA: Diagnosis not present

## 2018-10-25 DIAGNOSIS — R278 Other lack of coordination: Secondary | ICD-10-CM | POA: Diagnosis not present

## 2018-10-25 DIAGNOSIS — M6281 Muscle weakness (generalized): Secondary | ICD-10-CM | POA: Diagnosis not present

## 2018-10-25 DIAGNOSIS — R2681 Unsteadiness on feet: Secondary | ICD-10-CM | POA: Diagnosis not present

## 2018-10-26 DIAGNOSIS — R2689 Other abnormalities of gait and mobility: Secondary | ICD-10-CM | POA: Diagnosis not present

## 2018-10-26 DIAGNOSIS — R278 Other lack of coordination: Secondary | ICD-10-CM | POA: Diagnosis not present

## 2018-10-26 DIAGNOSIS — R2681 Unsteadiness on feet: Secondary | ICD-10-CM | POA: Diagnosis not present

## 2018-10-26 DIAGNOSIS — M6281 Muscle weakness (generalized): Secondary | ICD-10-CM | POA: Diagnosis not present

## 2018-10-31 ENCOUNTER — Other Ambulatory Visit: Payer: Self-pay | Admitting: Cardiovascular Disease

## 2018-10-31 DIAGNOSIS — R2681 Unsteadiness on feet: Secondary | ICD-10-CM | POA: Diagnosis not present

## 2018-10-31 DIAGNOSIS — M6281 Muscle weakness (generalized): Secondary | ICD-10-CM | POA: Diagnosis not present

## 2018-10-31 DIAGNOSIS — R278 Other lack of coordination: Secondary | ICD-10-CM | POA: Diagnosis not present

## 2018-10-31 DIAGNOSIS — R2689 Other abnormalities of gait and mobility: Secondary | ICD-10-CM | POA: Diagnosis not present

## 2018-11-01 DIAGNOSIS — R278 Other lack of coordination: Secondary | ICD-10-CM | POA: Diagnosis not present

## 2018-11-01 DIAGNOSIS — R2689 Other abnormalities of gait and mobility: Secondary | ICD-10-CM | POA: Diagnosis not present

## 2018-11-01 DIAGNOSIS — M6281 Muscle weakness (generalized): Secondary | ICD-10-CM | POA: Diagnosis not present

## 2018-11-01 DIAGNOSIS — R2681 Unsteadiness on feet: Secondary | ICD-10-CM | POA: Diagnosis not present

## 2018-11-02 DIAGNOSIS — R2681 Unsteadiness on feet: Secondary | ICD-10-CM | POA: Diagnosis not present

## 2018-11-02 DIAGNOSIS — R278 Other lack of coordination: Secondary | ICD-10-CM | POA: Diagnosis not present

## 2018-11-02 DIAGNOSIS — R2689 Other abnormalities of gait and mobility: Secondary | ICD-10-CM | POA: Diagnosis not present

## 2018-11-02 DIAGNOSIS — M6281 Muscle weakness (generalized): Secondary | ICD-10-CM | POA: Diagnosis not present

## 2018-11-07 DIAGNOSIS — R2681 Unsteadiness on feet: Secondary | ICD-10-CM | POA: Diagnosis not present

## 2018-11-07 DIAGNOSIS — R2689 Other abnormalities of gait and mobility: Secondary | ICD-10-CM | POA: Diagnosis not present

## 2018-11-07 DIAGNOSIS — M6281 Muscle weakness (generalized): Secondary | ICD-10-CM | POA: Diagnosis not present

## 2018-11-07 DIAGNOSIS — R278 Other lack of coordination: Secondary | ICD-10-CM | POA: Diagnosis not present

## 2018-11-08 DIAGNOSIS — M6281 Muscle weakness (generalized): Secondary | ICD-10-CM | POA: Diagnosis not present

## 2018-11-08 DIAGNOSIS — R2681 Unsteadiness on feet: Secondary | ICD-10-CM | POA: Diagnosis not present

## 2018-11-08 DIAGNOSIS — R278 Other lack of coordination: Secondary | ICD-10-CM | POA: Diagnosis not present

## 2018-11-08 DIAGNOSIS — R2689 Other abnormalities of gait and mobility: Secondary | ICD-10-CM | POA: Diagnosis not present

## 2018-11-10 DIAGNOSIS — M6281 Muscle weakness (generalized): Secondary | ICD-10-CM | POA: Diagnosis not present

## 2018-11-10 DIAGNOSIS — R2689 Other abnormalities of gait and mobility: Secondary | ICD-10-CM | POA: Diagnosis not present

## 2018-11-10 DIAGNOSIS — R2681 Unsteadiness on feet: Secondary | ICD-10-CM | POA: Diagnosis not present

## 2018-11-10 DIAGNOSIS — R278 Other lack of coordination: Secondary | ICD-10-CM | POA: Diagnosis not present

## 2018-11-11 DIAGNOSIS — R2681 Unsteadiness on feet: Secondary | ICD-10-CM | POA: Diagnosis not present

## 2018-11-11 DIAGNOSIS — R2689 Other abnormalities of gait and mobility: Secondary | ICD-10-CM | POA: Diagnosis not present

## 2018-11-11 DIAGNOSIS — M6281 Muscle weakness (generalized): Secondary | ICD-10-CM | POA: Diagnosis not present

## 2018-11-11 DIAGNOSIS — R278 Other lack of coordination: Secondary | ICD-10-CM | POA: Diagnosis not present

## 2018-11-14 DIAGNOSIS — R2681 Unsteadiness on feet: Secondary | ICD-10-CM | POA: Diagnosis not present

## 2018-11-14 DIAGNOSIS — M6281 Muscle weakness (generalized): Secondary | ICD-10-CM | POA: Diagnosis not present

## 2018-11-14 DIAGNOSIS — R2689 Other abnormalities of gait and mobility: Secondary | ICD-10-CM | POA: Diagnosis not present

## 2018-11-14 DIAGNOSIS — R278 Other lack of coordination: Secondary | ICD-10-CM | POA: Diagnosis not present

## 2018-11-15 DIAGNOSIS — R2689 Other abnormalities of gait and mobility: Secondary | ICD-10-CM | POA: Diagnosis not present

## 2018-11-15 DIAGNOSIS — M6281 Muscle weakness (generalized): Secondary | ICD-10-CM | POA: Diagnosis not present

## 2018-11-15 DIAGNOSIS — R278 Other lack of coordination: Secondary | ICD-10-CM | POA: Diagnosis not present

## 2018-11-15 DIAGNOSIS — R2681 Unsteadiness on feet: Secondary | ICD-10-CM | POA: Diagnosis not present

## 2018-11-18 DIAGNOSIS — R2689 Other abnormalities of gait and mobility: Secondary | ICD-10-CM | POA: Diagnosis not present

## 2018-11-18 DIAGNOSIS — R2681 Unsteadiness on feet: Secondary | ICD-10-CM | POA: Diagnosis not present

## 2018-11-18 DIAGNOSIS — R278 Other lack of coordination: Secondary | ICD-10-CM | POA: Diagnosis not present

## 2018-11-18 DIAGNOSIS — M6281 Muscle weakness (generalized): Secondary | ICD-10-CM | POA: Diagnosis not present

## 2018-11-20 DIAGNOSIS — R2689 Other abnormalities of gait and mobility: Secondary | ICD-10-CM | POA: Diagnosis not present

## 2018-11-20 DIAGNOSIS — R2681 Unsteadiness on feet: Secondary | ICD-10-CM | POA: Diagnosis not present

## 2018-11-20 DIAGNOSIS — M6281 Muscle weakness (generalized): Secondary | ICD-10-CM | POA: Diagnosis not present

## 2018-11-20 DIAGNOSIS — R278 Other lack of coordination: Secondary | ICD-10-CM | POA: Diagnosis not present

## 2018-11-21 DIAGNOSIS — R2681 Unsteadiness on feet: Secondary | ICD-10-CM | POA: Diagnosis not present

## 2018-11-21 DIAGNOSIS — R2689 Other abnormalities of gait and mobility: Secondary | ICD-10-CM | POA: Diagnosis not present

## 2018-11-21 DIAGNOSIS — M6281 Muscle weakness (generalized): Secondary | ICD-10-CM | POA: Diagnosis not present

## 2018-11-21 DIAGNOSIS — R278 Other lack of coordination: Secondary | ICD-10-CM | POA: Diagnosis not present

## 2018-11-22 DIAGNOSIS — R2681 Unsteadiness on feet: Secondary | ICD-10-CM | POA: Diagnosis not present

## 2018-11-22 DIAGNOSIS — M6281 Muscle weakness (generalized): Secondary | ICD-10-CM | POA: Diagnosis not present

## 2018-11-22 DIAGNOSIS — R278 Other lack of coordination: Secondary | ICD-10-CM | POA: Diagnosis not present

## 2018-11-22 DIAGNOSIS — R2689 Other abnormalities of gait and mobility: Secondary | ICD-10-CM | POA: Diagnosis not present

## 2018-11-24 DIAGNOSIS — R2689 Other abnormalities of gait and mobility: Secondary | ICD-10-CM | POA: Diagnosis not present

## 2018-11-24 DIAGNOSIS — M6281 Muscle weakness (generalized): Secondary | ICD-10-CM | POA: Diagnosis not present

## 2018-11-24 DIAGNOSIS — R278 Other lack of coordination: Secondary | ICD-10-CM | POA: Diagnosis not present

## 2018-11-24 DIAGNOSIS — R1312 Dysphagia, oropharyngeal phase: Secondary | ICD-10-CM | POA: Diagnosis not present

## 2018-11-24 DIAGNOSIS — R488 Other symbolic dysfunctions: Secondary | ICD-10-CM | POA: Diagnosis not present

## 2018-11-24 DIAGNOSIS — R1310 Dysphagia, unspecified: Secondary | ICD-10-CM | POA: Diagnosis not present

## 2018-11-24 DIAGNOSIS — R2681 Unsteadiness on feet: Secondary | ICD-10-CM | POA: Diagnosis not present

## 2018-11-24 DIAGNOSIS — R41841 Cognitive communication deficit: Secondary | ICD-10-CM | POA: Diagnosis not present

## 2018-11-25 DIAGNOSIS — R1310 Dysphagia, unspecified: Secondary | ICD-10-CM | POA: Diagnosis not present

## 2018-11-25 DIAGNOSIS — M6281 Muscle weakness (generalized): Secondary | ICD-10-CM | POA: Diagnosis not present

## 2018-11-25 DIAGNOSIS — R278 Other lack of coordination: Secondary | ICD-10-CM | POA: Diagnosis not present

## 2018-11-25 DIAGNOSIS — R2689 Other abnormalities of gait and mobility: Secondary | ICD-10-CM | POA: Diagnosis not present

## 2018-11-25 DIAGNOSIS — R1312 Dysphagia, oropharyngeal phase: Secondary | ICD-10-CM | POA: Diagnosis not present

## 2018-11-25 DIAGNOSIS — R41841 Cognitive communication deficit: Secondary | ICD-10-CM | POA: Diagnosis not present

## 2018-11-28 DIAGNOSIS — M6281 Muscle weakness (generalized): Secondary | ICD-10-CM | POA: Diagnosis not present

## 2018-11-28 DIAGNOSIS — R41841 Cognitive communication deficit: Secondary | ICD-10-CM | POA: Diagnosis not present

## 2018-11-28 DIAGNOSIS — R1310 Dysphagia, unspecified: Secondary | ICD-10-CM | POA: Diagnosis not present

## 2018-11-28 DIAGNOSIS — R278 Other lack of coordination: Secondary | ICD-10-CM | POA: Diagnosis not present

## 2018-11-28 DIAGNOSIS — R1312 Dysphagia, oropharyngeal phase: Secondary | ICD-10-CM | POA: Diagnosis not present

## 2018-11-28 DIAGNOSIS — R2689 Other abnormalities of gait and mobility: Secondary | ICD-10-CM | POA: Diagnosis not present

## 2018-11-29 DIAGNOSIS — R278 Other lack of coordination: Secondary | ICD-10-CM | POA: Diagnosis not present

## 2018-11-29 DIAGNOSIS — R1310 Dysphagia, unspecified: Secondary | ICD-10-CM | POA: Diagnosis not present

## 2018-11-29 DIAGNOSIS — R41841 Cognitive communication deficit: Secondary | ICD-10-CM | POA: Diagnosis not present

## 2018-11-29 DIAGNOSIS — M6281 Muscle weakness (generalized): Secondary | ICD-10-CM | POA: Diagnosis not present

## 2018-11-29 DIAGNOSIS — R1312 Dysphagia, oropharyngeal phase: Secondary | ICD-10-CM | POA: Diagnosis not present

## 2018-11-29 DIAGNOSIS — R2689 Other abnormalities of gait and mobility: Secondary | ICD-10-CM | POA: Diagnosis not present

## 2018-11-30 DIAGNOSIS — R1310 Dysphagia, unspecified: Secondary | ICD-10-CM | POA: Diagnosis not present

## 2018-11-30 DIAGNOSIS — R2689 Other abnormalities of gait and mobility: Secondary | ICD-10-CM | POA: Diagnosis not present

## 2018-11-30 DIAGNOSIS — R1312 Dysphagia, oropharyngeal phase: Secondary | ICD-10-CM | POA: Diagnosis not present

## 2018-11-30 DIAGNOSIS — M6281 Muscle weakness (generalized): Secondary | ICD-10-CM | POA: Diagnosis not present

## 2018-11-30 DIAGNOSIS — R278 Other lack of coordination: Secondary | ICD-10-CM | POA: Diagnosis not present

## 2018-11-30 DIAGNOSIS — R41841 Cognitive communication deficit: Secondary | ICD-10-CM | POA: Diagnosis not present

## 2018-12-01 DIAGNOSIS — R278 Other lack of coordination: Secondary | ICD-10-CM | POA: Diagnosis not present

## 2018-12-01 DIAGNOSIS — R2689 Other abnormalities of gait and mobility: Secondary | ICD-10-CM | POA: Diagnosis not present

## 2018-12-01 DIAGNOSIS — R41841 Cognitive communication deficit: Secondary | ICD-10-CM | POA: Diagnosis not present

## 2018-12-01 DIAGNOSIS — R1312 Dysphagia, oropharyngeal phase: Secondary | ICD-10-CM | POA: Diagnosis not present

## 2018-12-01 DIAGNOSIS — M6281 Muscle weakness (generalized): Secondary | ICD-10-CM | POA: Diagnosis not present

## 2018-12-01 DIAGNOSIS — R1310 Dysphagia, unspecified: Secondary | ICD-10-CM | POA: Diagnosis not present

## 2018-12-06 ENCOUNTER — Inpatient Hospital Stay (HOSPITAL_COMMUNITY): Payer: Medicare Other

## 2018-12-06 ENCOUNTER — Emergency Department (HOSPITAL_COMMUNITY): Payer: Medicare Other

## 2018-12-06 ENCOUNTER — Other Ambulatory Visit: Payer: Self-pay

## 2018-12-06 ENCOUNTER — Inpatient Hospital Stay (HOSPITAL_COMMUNITY)
Admission: EM | Admit: 2018-12-06 | Discharge: 2018-12-09 | DRG: 536 | Disposition: A | Discharge status: Home Health | Payer: Medicare Other | Attending: Internal Medicine | Admitting: Internal Medicine

## 2018-12-06 DIAGNOSIS — Z888 Allergy status to other drugs, medicaments and biological substances status: Secondary | ICD-10-CM | POA: Diagnosis not present

## 2018-12-06 DIAGNOSIS — K219 Gastro-esophageal reflux disease without esophagitis: Secondary | ICD-10-CM | POA: Diagnosis present

## 2018-12-06 DIAGNOSIS — Y92008 Other place in unspecified non-institutional (private) residence as the place of occurrence of the external cause: Secondary | ICD-10-CM | POA: Diagnosis not present

## 2018-12-06 DIAGNOSIS — E039 Hypothyroidism, unspecified: Secondary | ICD-10-CM | POA: Diagnosis present

## 2018-12-06 DIAGNOSIS — Z87442 Personal history of urinary calculi: Secondary | ICD-10-CM

## 2018-12-06 DIAGNOSIS — I2510 Atherosclerotic heart disease of native coronary artery without angina pectoris: Secondary | ICD-10-CM | POA: Diagnosis present

## 2018-12-06 DIAGNOSIS — R1312 Dysphagia, oropharyngeal phase: Secondary | ICD-10-CM | POA: Diagnosis not present

## 2018-12-06 DIAGNOSIS — I5032 Chronic diastolic (congestive) heart failure: Secondary | ICD-10-CM | POA: Diagnosis present

## 2018-12-06 DIAGNOSIS — W19XXXA Unspecified fall, initial encounter: Secondary | ICD-10-CM | POA: Diagnosis not present

## 2018-12-06 DIAGNOSIS — Z7982 Long term (current) use of aspirin: Secondary | ICD-10-CM | POA: Diagnosis not present

## 2018-12-06 DIAGNOSIS — Z1159 Encounter for screening for other viral diseases: Secondary | ICD-10-CM | POA: Diagnosis not present

## 2018-12-06 DIAGNOSIS — R41841 Cognitive communication deficit: Secondary | ICD-10-CM | POA: Diagnosis not present

## 2018-12-06 DIAGNOSIS — Z955 Presence of coronary angioplasty implant and graft: Secondary | ICD-10-CM

## 2018-12-06 DIAGNOSIS — Z66 Do not resuscitate: Secondary | ICD-10-CM | POA: Diagnosis present

## 2018-12-06 DIAGNOSIS — S72112A Displaced fracture of greater trochanter of left femur, initial encounter for closed fracture: Principal | ICD-10-CM | POA: Diagnosis present

## 2018-12-06 DIAGNOSIS — Y9301 Activity, walking, marching and hiking: Secondary | ICD-10-CM | POA: Diagnosis present

## 2018-12-06 DIAGNOSIS — F329 Major depressive disorder, single episode, unspecified: Secondary | ICD-10-CM | POA: Diagnosis present

## 2018-12-06 DIAGNOSIS — E7849 Other hyperlipidemia: Secondary | ICD-10-CM | POA: Diagnosis not present

## 2018-12-06 DIAGNOSIS — R52 Pain, unspecified: Secondary | ICD-10-CM | POA: Diagnosis not present

## 2018-12-06 DIAGNOSIS — Z7983 Long term (current) use of bisphosphonates: Secondary | ICD-10-CM | POA: Diagnosis not present

## 2018-12-06 DIAGNOSIS — Z79890 Hormone replacement therapy (postmenopausal): Secondary | ICD-10-CM

## 2018-12-06 DIAGNOSIS — E785 Hyperlipidemia, unspecified: Secondary | ICD-10-CM | POA: Diagnosis present

## 2018-12-06 DIAGNOSIS — R918 Other nonspecific abnormal finding of lung field: Secondary | ICD-10-CM | POA: Diagnosis present

## 2018-12-06 DIAGNOSIS — E7800 Pure hypercholesterolemia, unspecified: Secondary | ICD-10-CM | POA: Diagnosis present

## 2018-12-06 DIAGNOSIS — Z8673 Personal history of transient ischemic attack (TIA), and cerebral infarction without residual deficits: Secondary | ICD-10-CM | POA: Diagnosis not present

## 2018-12-06 DIAGNOSIS — R278 Other lack of coordination: Secondary | ICD-10-CM | POA: Diagnosis not present

## 2018-12-06 DIAGNOSIS — S72002A Fracture of unspecified part of neck of left femur, initial encounter for closed fracture: Secondary | ICD-10-CM

## 2018-12-06 DIAGNOSIS — I110 Hypertensive heart disease with heart failure: Secondary | ICD-10-CM | POA: Diagnosis present

## 2018-12-06 DIAGNOSIS — Z7902 Long term (current) use of antithrombotics/antiplatelets: Secondary | ICD-10-CM | POA: Diagnosis not present

## 2018-12-06 DIAGNOSIS — Z79899 Other long term (current) drug therapy: Secondary | ICD-10-CM

## 2018-12-06 DIAGNOSIS — R2689 Other abnormalities of gait and mobility: Secondary | ICD-10-CM | POA: Diagnosis not present

## 2018-12-06 DIAGNOSIS — R531 Weakness: Secondary | ICD-10-CM | POA: Diagnosis not present

## 2018-12-06 DIAGNOSIS — Z96642 Presence of left artificial hip joint: Secondary | ICD-10-CM | POA: Diagnosis present

## 2018-12-06 DIAGNOSIS — R1310 Dysphagia, unspecified: Secondary | ICD-10-CM | POA: Diagnosis not present

## 2018-12-06 DIAGNOSIS — I25810 Atherosclerosis of coronary artery bypass graft(s) without angina pectoris: Secondary | ICD-10-CM | POA: Diagnosis present

## 2018-12-06 DIAGNOSIS — Z951 Presence of aortocoronary bypass graft: Secondary | ICD-10-CM | POA: Diagnosis not present

## 2018-12-06 DIAGNOSIS — W010XXA Fall on same level from slipping, tripping and stumbling without subsequent striking against object, initial encounter: Secondary | ICD-10-CM | POA: Diagnosis present

## 2018-12-06 DIAGNOSIS — I5189 Other ill-defined heart diseases: Secondary | ICD-10-CM | POA: Diagnosis not present

## 2018-12-06 DIAGNOSIS — R0902 Hypoxemia: Secondary | ICD-10-CM | POA: Diagnosis not present

## 2018-12-06 DIAGNOSIS — Z882 Allergy status to sulfonamides status: Secondary | ICD-10-CM

## 2018-12-06 DIAGNOSIS — R0689 Other abnormalities of breathing: Secondary | ICD-10-CM | POA: Diagnosis not present

## 2018-12-06 DIAGNOSIS — M25552 Pain in left hip: Secondary | ICD-10-CM | POA: Diagnosis not present

## 2018-12-06 DIAGNOSIS — Z8249 Family history of ischemic heart disease and other diseases of the circulatory system: Secondary | ICD-10-CM | POA: Diagnosis not present

## 2018-12-06 DIAGNOSIS — Z87440 Personal history of urinary (tract) infections: Secondary | ICD-10-CM

## 2018-12-06 DIAGNOSIS — S7292XA Unspecified fracture of left femur, initial encounter for closed fracture: Secondary | ICD-10-CM | POA: Diagnosis not present

## 2018-12-06 DIAGNOSIS — G4733 Obstructive sleep apnea (adult) (pediatric): Secondary | ICD-10-CM | POA: Diagnosis present

## 2018-12-06 DIAGNOSIS — S0990XA Unspecified injury of head, initial encounter: Secondary | ICD-10-CM | POA: Diagnosis not present

## 2018-12-06 DIAGNOSIS — Z03818 Encounter for observation for suspected exposure to other biological agents ruled out: Secondary | ICD-10-CM | POA: Diagnosis not present

## 2018-12-06 DIAGNOSIS — Z7401 Bed confinement status: Secondary | ICD-10-CM | POA: Diagnosis not present

## 2018-12-06 DIAGNOSIS — M6281 Muscle weakness (generalized): Secondary | ICD-10-CM | POA: Diagnosis not present

## 2018-12-06 DIAGNOSIS — M2550 Pain in unspecified joint: Secondary | ICD-10-CM | POA: Diagnosis not present

## 2018-12-06 LAB — CBC WITH DIFFERENTIAL/PLATELET
Abs Immature Granulocytes: 0.05 10*3/uL (ref 0.00–0.07)
Basophils Absolute: 0 10*3/uL (ref 0.0–0.1)
Basophils Relative: 0 %
Eosinophils Absolute: 0.2 10*3/uL (ref 0.0–0.5)
Eosinophils Relative: 2 %
HCT: 36.5 % (ref 36.0–46.0)
Hemoglobin: 11.7 g/dL — ABNORMAL LOW (ref 12.0–15.0)
Immature Granulocytes: 0 %
Lymphocytes Relative: 20 %
Lymphs Abs: 2.7 10*3/uL (ref 0.7–4.0)
MCH: 31.5 pg (ref 26.0–34.0)
MCHC: 32.1 g/dL (ref 30.0–36.0)
MCV: 98.1 fL (ref 80.0–100.0)
Monocytes Absolute: 0.8 10*3/uL (ref 0.1–1.0)
Monocytes Relative: 6 %
Neutro Abs: 9.5 10*3/uL — ABNORMAL HIGH (ref 1.7–7.7)
Neutrophils Relative %: 72 %
Platelets: 217 10*3/uL (ref 150–400)
RBC: 3.72 MIL/uL — ABNORMAL LOW (ref 3.87–5.11)
RDW: 12.5 % (ref 11.5–15.5)
WBC: 13.3 10*3/uL — ABNORMAL HIGH (ref 4.0–10.5)
nRBC: 0 % (ref 0.0–0.2)

## 2018-12-06 LAB — BASIC METABOLIC PANEL
Anion gap: 10 (ref 5–15)
BUN: 18 mg/dL (ref 8–23)
CO2: 27 mmol/L (ref 22–32)
Calcium: 9.1 mg/dL (ref 8.9–10.3)
Chloride: 100 mmol/L (ref 98–111)
Creatinine, Ser: 0.6 mg/dL (ref 0.44–1.00)
GFR calc Af Amer: >60 mL/min (ref >60)
GFR calc non Af Amer: >60 mL/min (ref >60)
Glucose, Bld: 122 mg/dL — ABNORMAL HIGH (ref 70–99)
Potassium: 3.9 mmol/L (ref 3.5–5.1)
Sodium: 137 mmol/L (ref 135–145)

## 2018-12-06 LAB — URINALYSIS, ROUTINE W REFLEX MICROSCOPIC
Bilirubin Urine: NEGATIVE
Glucose, UA: NEGATIVE mg/dL
Hgb urine dipstick: NEGATIVE
Ketones, ur: NEGATIVE mg/dL
Leukocytes,Ua: NEGATIVE
Nitrite: NEGATIVE
Protein, ur: NEGATIVE mg/dL
Specific Gravity, Urine: 1.018 (ref 1.005–1.030)
pH: 6 (ref 5.0–8.0)

## 2018-12-06 LAB — SARS CORONAVIRUS 2 BY RT PCR (HOSPITAL ORDER, PERFORMED IN ~~LOC~~ HOSPITAL LAB): SARS Coronavirus 2: NEGATIVE

## 2018-12-06 MED ORDER — ONDANSETRON HCL 4 MG PO TABS: 4.0000 mg | ORAL_TABLET | Freq: Four times a day (QID) | ORAL | Status: DC | PRN

## 2018-12-06 MED ORDER — ALBUTEROL SULFATE (2.5 MG/3ML) 0.083% IN NEBU: 2.5000 mg | INHALATION_SOLUTION | Freq: Every day | RESPIRATORY_TRACT | Status: DC | PRN

## 2018-12-06 MED ORDER — CLOPIDOGREL BISULFATE 75 MG PO TABS
75.0000 mg | ORAL_TABLET | Freq: Every day | ORAL | Status: DC
  Administered 2018-12-07 – 2018-12-09 (×3): 75 mg via ORAL
  Filled 2018-12-06 (×3): qty 1

## 2018-12-06 MED ORDER — ONDANSETRON HCL 4 MG/2ML IJ SOLN: 4.0000 mg | Freq: Four times a day (QID) | INTRAMUSCULAR | Status: DC | PRN

## 2018-12-06 MED ORDER — ISOSORBIDE MONONITRATE ER 30 MG PO TB24
15.0000 mg | ORAL_TABLET | Freq: Every day | ORAL | Status: DC
  Administered 2018-12-07 – 2018-12-09 (×3): 15 mg via ORAL
  Filled 2018-12-06 (×3): qty 1

## 2018-12-06 MED ORDER — METOPROLOL SUCCINATE ER 25 MG PO TB24
25.0000 mg | ORAL_TABLET | Freq: Every day | ORAL | Status: DC
  Administered 2018-12-07 – 2018-12-09 (×3): 25 mg via ORAL
  Filled 2018-12-06 (×3): qty 1

## 2018-12-06 MED ORDER — PANTOPRAZOLE SODIUM 40 MG PO TBEC
40.0000 mg | DELAYED_RELEASE_TABLET | Freq: Every day | ORAL | Status: DC
  Administered 2018-12-07 – 2018-12-09 (×3): 40 mg via ORAL
  Filled 2018-12-06 (×3): qty 1

## 2018-12-06 MED ORDER — ACETAMINOPHEN 650 MG RE SUPP: 650.0000 mg | Freq: Four times a day (QID) | RECTAL | Status: DC | PRN

## 2018-12-06 MED ORDER — SERTRALINE HCL 50 MG PO TABS
50.0000 mg | ORAL_TABLET | Freq: Every evening | ORAL | Status: DC
  Administered 2018-12-06 – 2018-12-09 (×4): 50 mg via ORAL
  Filled 2018-12-06 (×4): qty 1

## 2018-12-06 MED ORDER — ROSUVASTATIN CALCIUM 20 MG PO TABS
20.0000 mg | ORAL_TABLET | Freq: Every evening | ORAL | Status: DC
  Administered 2018-12-06 – 2018-12-09 (×4): 20 mg via ORAL
  Filled 2018-12-06 (×4): qty 1

## 2018-12-06 MED ORDER — ACETAMINOPHEN 325 MG PO TABS
650.0000 mg | ORAL_TABLET | Freq: Four times a day (QID) | ORAL | Status: DC | PRN
  Administered 2018-12-06 – 2018-12-09 (×7): 650 mg via ORAL
  Filled 2018-12-06 (×7): qty 2

## 2018-12-06 MED ORDER — LEVOTHYROXINE SODIUM 88 MCG PO TABS
88.0000 ug | ORAL_TABLET | Freq: Every day | ORAL | Status: DC
  Administered 2018-12-07 – 2018-12-09 (×3): 88 ug via ORAL
  Filled 2018-12-06 (×3): qty 1

## 2018-12-06 MED ORDER — LOSARTAN POTASSIUM 50 MG PO TABS
25.0000 mg | ORAL_TABLET | Freq: Two times a day (BID) | ORAL | Status: DC
  Administered 2018-12-06 – 2018-12-09 (×6): 25 mg via ORAL
  Filled 2018-12-06 (×6): qty 1

## 2018-12-06 MED ORDER — OXYCODONE HCL 5 MG PO TABS
5.0000 mg | ORAL_TABLET | ORAL | Status: DC | PRN
  Administered 2018-12-09: 5 mg via ORAL
  Filled 2018-12-06: qty 1

## 2018-12-06 MED ORDER — ASPIRIN EC 81 MG PO TBEC
81.0000 mg | DELAYED_RELEASE_TABLET | Freq: Every evening | ORAL | Status: DC
  Administered 2018-12-07 – 2018-12-09 (×3): 81 mg via ORAL
  Filled 2018-12-06 (×3): qty 1

## 2018-12-06 NOTE — ED Notes (Signed)
Verified with receiving RN that the pt does not meet criteria to have the pts daughter accompany her to the unit, family updated and verbalizes understanding, pts family reports concerns re: pt having intermittent sundowners, pt has been calm and cooperative and A&O x4 while on unit, pts daughter is requesting to be contacted by the attending and ortho team tomorrow

## 2018-12-06 NOTE — ED Triage Notes (Signed)
Pt brought in by ems from [facility] ; Pt fell approx 15 min prior to arrival; walking looked down bent over  to pick up something and lost her balance and fell  on left side ; on Plavix did not hit head ; pt only c/o left hip pain ; no obvious deformity/shortening/rotation noticed ; alert and oriented x 4 , following simple commands   154/86 60 16 96 RA  139 cbg  Temp 97.7  2-10 pain to left hip

## 2018-12-06 NOTE — ED Notes (Signed)
Patient transported to CT 

## 2018-12-06 NOTE — ED Notes (Signed)
ED TO INPATIENT HANDOFF REPORT  ED Nurse Name and Phone #:  Abigail Butts [phone]  S Name/Age/Gender Gina Bowers 85 y.o. female Room/Bed: 042C/042C  Code Status   Code Status: Prior  Home/SNF/Other Skilled nursing facility Patient oriented to: self, place, time and situation Is this baseline? Yes   Triage Complete: Triage complete  Chief Complaint Fall,Left hip pain   Triage Note Pt brought in by ems from [facility] ; Pt fell approx 15 min prior to arrival; walking looked down bent over  to pick up something and lost her balance and fell  on left side ; on Plavix did not hit head ; pt only c/o left hip pain ; no obvious deformity/shortening/rotation noticed ; alert and oriented x 4 , following simple commands   154/86 60 16 96 RA  139 cbg  Temp 97.7  2-10 pain to left hip       Allergies Allergies  Allergen Reactions  . Sulfa Antibiotics Hives  . Sulfonamide Derivatives Hives    Level of Care/Admitting Diagnosis ED Disposition    ED Disposition Condition [facility]: [place] [[id number]]  Level of Care: Med-Surg [16]  Covid Evaluation: Asymptomatic Screening Protocol (No Symptoms)  Diagnosis: Closed displaced fracture of greater trochanter of left femur, initial encounter [facility]) [[id number]]  Admitting Physician: Lenore Cordia [[id number]]  Attending Physician: Lenore Cordia [[id number]]  Estimated length of stay: past midnight tomorrow  Certification:: I certify this patient will need inpatient services for at least 2 midnights  PT Class (Do Not Modify): Inpatient [101]  PT Acc Code (Do Not Modify): Private [1]       B Medical/Surgery History Past Medical History:  Diagnosis Date  . Arthritis   . Blood transfusion 2007   AFTER HIP REPLACEMENT  . Carotid bruit    PT'S DAUGHTER STATES PT HAD RECENT CAROTID STUDY AND WAS TOLD NO SIGNIFICANT BLOCKAGES  . Chronic diastolic CHF (congestive heart failure) ([employer] [place])    a. 07/2015  Ech: Ef 60-65%, Gr1 DD.  Marland Kitchen Coronary artery disease    a. 2009 CABG 4, Dr. Servando Snare (LIMA to LAD , SVG to diagonal, SVG to circumflex, SVG to PDA); b. Cath 2012 in  Recent ill,[place] patent LIMA , Patent SVG to diagonal, occluded SVG to RCA. 5x19mm BMS->native RCA; c. 09/2015 Cath: LM nl, LAD 178m, D1 100, LCX small, OM1 90/small, OM2 70, RCA 45p, patent stent, VG->dRCA 100, VG->OM1 100, VG->Diag nl, LIMA->LAD nl->Med Rx.  . Depression   . Fracture FEB 2013   FRACTURED LEFT ANKLE--NO SURGERY--WAS IN BOOT UNTIL COUPLE WEEKS AGO  . GERD (gastroesophageal reflux disease)   . High cholesterol   . Hypothyroidism   . Ischemic colitis ([place]) 2010  . Kidney stones    IN THE PAST  . 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 [place] virus    COUPLE OF WEEKS AGO--ALL SYMTOMS RESOLVED PER PT  . Obstructive sleep apnea 2010  . Shortness of breath    AND WHEEZING AT TIMES--NO INHALERS  . Stroke ([place])    SEVERAL STROKES -TOLD SHE HAS CEREBELLUM BLOCKAGE AS RESULT OF STROKE--BUT NEUROLOGIST SAID HE DID NOT WANT TO DO ANY PROCEDURE BECAUSE OF HER AGE .  PT HAS SLIGHT LEFT FOOT DROP-DRAGS FOOT WHEN WALKING - USES WALKER  . Urinary incontinence   . UTI (lower urinary tract infection)    HX OF UTI'S-- LAST TIME COUPLE OF MONTHS AGO  .  Vertigo    Past Surgical History:  Procedure Laterality Date  . ABDOMINAL HYSTERECTOMY    . APPENDECTOMY    . Media AND 1990  . CARDIAC CATHETERIZATION N/A 10/07/2015   Procedure: Left Heart Cath and Cors/Grafts Angiography;  Surgeon: Peter M Martinique, MD;  Location: [facility] [facility];  Service: Cardiovascular;  Laterality: N/A;  . CATARACT EXTRACTION    . CORONARY ANGIOPLASTY  10/2010   WITH STENT PLACEMENT  . CORONARY ARTERY BYPASS GRAFT  2009  . CYSTOSCOPY WITH INJECTION  09/21/2011   Procedure: CYSTOSCOPY WITH INJECTION;  Surgeon: Ailene Rud, MD;  Location: [facility];  Service: Urology;  Laterality: N/A;  Peri Urethral  Macroplastique Injection and Estring Placement  . HERNIA REPAIR    . HIP SURGERY    . JOINT REPLACEMENT  2007   HIP REPLACEMENT -LEFT  . TONSILLECTOMY       A IV Location/Drains/Wounds Patient Lines/Drains/Airways Status   Active Line/Drains/Airways    Name:   Placement date:   Placement time:   Site:   Days:   Peripheral IV 12/06/18 Right Wrist   12/06/18    1839    Wrist   less than 1   External Urinary Catheter   06/08/17    2200    -   546          Intake/Output Last 24 hours No intake or output data in the 24 hours ending 12/06/18 2031  Labs/Imaging Results for orders placed or performed during the hospital encounter of 12/06/18 (from the past 48 hour(s))  Basic metabolic panel     Status: Abnormal   Collection Time: 12/06/18  6:24 PM  Result Value Ref Range   Sodium 137 135 - 145 mmol/L   Potassium 3.9 3.5 - 5.1 mmol/L   Chloride 100 98 - 111 mmol/L   CO2 27 22 - 32 mmol/L   Glucose, Bld 122 (H) 70 - 99 mg/dL   BUN 18 8 - 23 mg/dL   Creatinine, Ser 0.60 0.44 - 1.00 mg/dL   Calcium 9.1 8.9 - 10.3 mg/dL   GFR calc non Af Amer >60 >60 mL/min   GFR calc Af Amer >60 >60 mL/min   Anion gap 10 5 - 15    Comment: Performed at [facility], [place] [address] [place], [place] [address]  CBC with Differential     Status: Abnormal   Collection Time: 12/06/18  6:24 PM  Result Value Ref Range   WBC 13.3 (H) 4.0 - 10.5 K/uL   RBC 3.72 (L) 3.87 - 5.11 MIL/uL   Hemoglobin 11.7 (L) 12.0 - 15.0 g/dL   HCT 36.5 36.0 - 46.0 %   MCV 98.1 80.0 - 100.0 fL   MCH 31.5 26.0 - 34.0 pg   MCHC 32.1 30.0 - 36.0 g/dL   RDW 12.5 11.5 - 15.5 %   Platelets 217 150 - 400 K/uL   nRBC 0.0 0.0 - 0.2 %   Neutrophils Relative % 72 %   Neutro Abs 9.5 (H) 1.7 - 7.7 K/uL   Lymphocytes Relative 20 %   Lymphs Abs 2.7 0.7 - 4.0 K/uL   Monocytes Relative 6 %   Monocytes Absolute 0.8 0.1 - 1.0 K/uL   Eosinophils Relative 2 %   Eosinophils Absolute 0.2 0.0 - 0.5 K/uL   Basophils Relative 0 %    Basophils Absolute 0.0 0.0 - 0.1 K/uL   Immature Granulocytes 0 %  Abs Immature Granulocytes 0.05 0.00 - 0.07 K/uL    Comment: Performed at [facility], [place] [address] [place], [place] [address]  Urinalysis, Routine w reflex microscopic     Status: None   Collection Time: 12/06/18  6:24 PM  Result Value Ref Range   Color, Urine YELLOW YELLOW   APPearance CLEAR CLEAR   Specific Gravity, Urine 1.018 1.005 - 1.030   pH 6.0 5.0 - 8.0   Glucose, UA NEGATIVE NEGATIVE mg/dL   Hgb urine dipstick NEGATIVE NEGATIVE   Bilirubin Urine NEGATIVE NEGATIVE   Ketones, ur NEGATIVE NEGATIVE mg/dL   Protein, ur NEGATIVE NEGATIVE mg/dL   Nitrite NEGATIVE NEGATIVE   Leukocytes,Ua NEGATIVE NEGATIVE    Comment: Performed at [facility] [address] [place], [place] [address]  SARS Coronavirus 2 (CEPHEID - Performed in [facility] hospital lab), Hosp Order     Status: None   Collection Time: 12/06/18  6:44 PM   Specimen: Nasopharyngeal Swab  Result Value Ref Range   SARS Coronavirus 2 NEGATIVE NEGATIVE    Comment: (NOTE) If result is NEGATIVE SARS-CoV-2 target nucleic acids are NOT DETECTED. The SARS-CoV-2 RNA is generally detectable in upper and lower  respiratory specimens during the acute phase of infection. The lowest  concentration of SARS-CoV-2 viral copies this assay can detect is 250  copies / mL. A negative result does not preclude SARS-CoV-2 infection  and should not be used as the sole basis for treatment or other  patient management decisions.  A negative result may occur with  improper specimen collection / handling, submission of specimen other  than nasopharyngeal swab, presence of viral mutation(s) within the  areas targeted by this assay, and inadequate number of viral copies  (<250 copies / mL). A negative result must be combined with clinical  observations, patient history, and epidemiological information. If result is POSITIVE SARS-CoV-2 target nucleic acids are  DETECTED. The SARS-CoV-2 RNA is generally detectable in upper and lower  respiratory specimens dur ing the acute phase of infection.  Positive  results are indicative of active infection with SARS-CoV-2.  Clinical  correlation with patient history and other diagnostic information is  necessary to determine patient infection status.  Positive results do  not rule out bacterial infection or co-infection with other viruses. If result is PRESUMPTIVE POSTIVE SARS-CoV-2 nucleic acids MAY BE PRESENT.   A presumptive positive result was obtained on the submitted specimen  and confirmed on repeat testing.  While 2019 novel coronavirus  (SARS-CoV-2) nucleic acids may be present in the submitted sample  additional confirmatory testing may be necessary for epidemiological  and / or clinical management purposes  to differentiate between  SARS-CoV-2 and other Sarbecovirus currently known to infect humans.  If clinically indicated additional testing with an alternate test  methodology [phone]) is advised. The SARS-CoV-2 RNA is generally  detectable in upper and lower respiratory sp ecimens during the acute  phase of infection. The expected result is Negative. Fact Sheet for Patients:  StrictlyIdeas.no Fact Sheet for Healthcare Providers: BankingDealers.co.za This test is not yet approved or cleared by the [place] FDA and has been authorized for detection and/or diagnosis of SARS-CoV-2 by FDA under an Emergency Use Authorization (EUA).  This EUA will remain in effect (meaning this test can be used) for the duration of the COVID-19 declaration under Section 564(b)(1) of the Act, 21 U.S.C. section 360bbb-3(b)(1), unless the authorization is terminated or revoked sooner. Performed at [facility],  [address], [place], [place] [address]    Ct Head Wo Contrast  Result Date: 12/06/2018 CLINICAL DATA:  Ct head wo, Pt brought in by ems from  [facility] ; Pt fell approx 15 min prior to arrival; walking looked down bent over to pick up something and lost her balance and fell on left side ; on Plavix Head trauma, minor, GCS>=13, high clinical risk, initial exam EXAM: CT HEAD WITHOUT CONTRAST TECHNIQUE: Contiguous axial images were obtained from the base of the skull through the vertex without intravenous contrast. COMPARISON:  03/16/2017 FINDINGS: Brain: No acute intracranial hemorrhage. No focal mass lesion. No CT evidence of acute infarction. No midline shift or mass effect. No hydrocephalus. Basilar cisterns are patent. There are periventricular and subcortical white matter hypodensities. Generalized cortical atrophy. Vascular: No hyperdense vessel or unexpected calcification. Skull: Normal. Negative for fracture or focal lesion. Sinuses/Orbits: Paranasal sinuses and mastoid air cells are clear. Orbits are clear. Other: None. IMPRESSION: 1. No acute intracranial findings. 2. Atrophy and chronic white matter microvascular disease. Electronically Signed   By: Suzy Bouchard M.D.   On: 12/06/2018 19:16   Dg Chest Portable 1 View  Result Date: 12/06/2018 CLINICAL DATA:  Hip fracture preoperative radiograph. History of sleep apnea, lung nodules, coronary artery disease, CABG, diastolic CHF. EXAM: PORTABLE CHEST 1 VIEW COMPARISON:  Chest radiograph 05/25/2018, chest CT 06/12/2016 FINDINGS: Low lung volumes with hypoventilatory changes in the lung bases. Lungs are otherwise clear. No visualized nodules or masses. Chronically coarsened interstitium. No pneumothorax. No effusion. Post CABG changes including intact and aligned median sternotomy wires and surgical material projecting over the cardiac silhouette. Cardiomediastinal contours are similar to prior studies. Cardiac monitoring leads overlie the chest. Linear radiodensity over the right shoulder likely reflects a mask wire. Degenerative changes in the imaged shoulders and spine. No acute  osseous abnormality. IMPRESSION: 1. Low lung volumes with hypoventilatory changes in the lung bases. No acute cardiopulmonary disease. 2. No convincing evidence of pulmonary edema. Electronically Signed   By: Lovena Le M.D.   On: 12/06/2018 20:06   Dg Hip Unilat W Or Wo Pelvis 2-3 Views Left  Result Date: 12/06/2018 CLINICAL DATA:  Fall, hip pain EXAM: DG HIP (WITH OR WITHOUT PELVIS) 2-3V LEFT COMPARISON:  07/02/2017 FINDINGS: Prior left hip replacement. There is a fracture through the base of the greater trochanter. Minimal displacement. No subluxation or dislocation. IMPRESSION: Minimally displaced fracture at the base of the left greater trochanter. Prior left hip replacement. Electronically Signed   By: Rolm Baptise M.D.   On: 12/06/2018 18:18    Pending Labs Unresulted Labs (From admission, onward)    Start     Ordered   12/06/18 1824  Urine culture  ONCE - STAT,   STAT     12/06/18 1824          Vitals/Pain Today's Vitals   12/06/18 1715 12/06/18 1718 12/06/18 1759 12/06/18 1845  BP: (!) 144/82  (!) 166/71 (!) 160/68  Pulse: 64  61 63  Resp: 19  (!) 22 15  Temp:      TempSrc:      SpO2: 92%  94% 94%  PainSc:  2       Isolation Precautions No active isolations  Medications Medications - No data to display  Mobility walks with device Moderate fall risk   Focused Assessments Cardiac Assessment Handoff:    Lab Results  Component Value Date   CKTOTAL 87 06/08/2017   CKMB 2.0 11/14/2011  TROPONINI <0.03 06/08/2017   No results found for: DDIMER Does the Patient currently have chest pain? No  , Neuro Assessment Handoff:  Swallow screen pass? No          Neuro Assessment:   Neuro Checks:      Last Documented NIHSS Modified Score:   Has TPA been given? No If patient is a Neuro Trauma and patient is going to OR before floor call report to [place] nurse: [phone] or [phone]     R Recommendations: See Admitting Provider Note  Report  given to:   Additional Notes:

## 2018-12-06 NOTE — H&P (Signed)
History and Physical    Gina Bowers GEZ:[id number] DOB: 04-05-1926 DOA: 12/06/2018  PCP: Lavone Orn, MD  Patient coming from: [place] have personally briefly reviewed patient's old medical records in [place]  Chief Complaint: Left hip pain.  HPI: Gina Bowers is a 85 y.o. female with medical history significant for CAD status post CABG, chronic diastolic CHF, history of CVA, hypertension hyperlipidemia, hypothyroidism, depression, and mild OSA not on CPAP who presents to the ED for evaluation of left hip pain.  Patient states she was in her usual state of health when she was walking down the hall with her walker.  She saw something on the ground and she bent forward to look at it however when she began to walk forward again she cut her left foot on the rug fell down onto her left side.  She had immediate pain and was not able to bear weight.  She denies any injury to her head or loss of consciousness.  She denies any associated chest pain, palpitations, dyspnea, abdominal pain, dysuria, or diarrhea.   ED Course:  Initial vitals showed BP 166/71, pulse 61, RR 22, temp 98.3 Fahrenheit, SPO2 94% on room air.  Labs are notable for WBC 13.3, hemoglobin 11.7, platelets 217,000, potassium 3.9, BUN 18, creatinine 0.6, urinalysis negative.  SARS-CoV-2 test negative.  Portable chest x-ray showed low lung volumes without focal consolidation or pulmonary edema.  CT head without contrast was negative for acute intracranial findings.  Left hip x-ray showed a minimally displaced fracture at the base of the left greater trochanter and prior left hip replacement.  EDP discussed with on-call [place], Dr. Lorin Mercy who recommended hospitalist admission and orthopedics to follow-up in a.m.  Hospitalist service was consulted to admit for further management.   Review of Systems: All systems reviewed and are negative except as documented in history of present illness above.    Past Medical History:  Diagnosis Date  . Arthritis   . Blood transfusion 2007   AFTER HIP REPLACEMENT  . Carotid bruit    PT'S DAUGHTER STATES PT HAD RECENT CAROTID STUDY AND WAS TOLD NO SIGNIFICANT BLOCKAGES  . Chronic diastolic CHF (congestive heart failure) ([place])    a. 07/2015 Ech: Ef 60-65%, Gr1 DD.  Marland Kitchen Coronary artery disease    a. 2009 CABG 4, Dr. Servando Snare (LIMA to LAD , SVG to diagonal, SVG to circumflex, SVG to PDA); b. Cath 2012 in  Recent ill,[place] patent LIMA , Patent SVG to diagonal, occluded SVG to RCA. 5x22mm BMS->native RCA; c. 09/2015 Cath: LM nl, LAD 168m, D1 100, LCX small, OM1 90/small, OM2 70, RCA 45p, patent stent, VG->dRCA 100, VG->OM1 100, VG->Diag nl, LIMA->LAD nl->Med Rx.  . Depression   . Fracture FEB 2013   FRACTURED LEFT ANKLE--NO SURGERY--WAS IN BOOT UNTIL COUPLE WEEKS AGO  . GERD (gastroesophageal reflux disease)   . High cholesterol   . Hypothyroidism   . Ischemic colitis ([place]) 2010  . Kidney stones    IN THE PAST  . 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 [place] virus    COUPLE OF WEEKS AGO--ALL SYMTOMS RESOLVED PER PT  . Obstructive sleep apnea 2010  . Shortness of breath    AND WHEEZING AT TIMES--NO INHALERS  . Stroke ([place])    SEVERAL STROKES -TOLD SHE HAS CEREBELLUM BLOCKAGE AS RESULT OF STROKE--BUT NEUROLOGIST SAID HE DID NOT WANT TO DO ANY PROCEDURE BECAUSE OF HER AGE .  PT HAS SLIGHT LEFT FOOT DROP-DRAGS FOOT WHEN WALKING - USES WALKER  . Urinary incontinence   . UTI (lower urinary tract infection)    HX OF UTI'S-- LAST TIME COUPLE OF MONTHS AGO  . Vertigo     Past Surgical History:  Procedure Laterality Date  . ABDOMINAL HYSTERECTOMY    . APPENDECTOMY    . [place] AND 1990  . CARDIAC CATHETERIZATION N/A 10/07/2015   Procedure: Left Heart Cath and Cors/Grafts Angiography;  Surgeon: Peter M Martinique, MD;  Location: [facility];  Service: Cardiovascular;  Laterality: N/A;  . CATARACT EXTRACTION     . CORONARY ANGIOPLASTY  10/2010   WITH STENT PLACEMENT  . CORONARY ARTERY BYPASS GRAFT  2009  . CYSTOSCOPY WITH INJECTION  09/21/2011   Procedure: CYSTOSCOPY WITH INJECTION;  Surgeon: Ailene Rud, MD;  Location: [facility];  Service: Urology;  Laterality: N/A;  Peri Urethral Macroplastique Injection and Estring Placement  . HERNIA REPAIR    . HIP SURGERY    . JOINT REPLACEMENT  2007   HIP REPLACEMENT -LEFT  . TONSILLECTOMY      Social History:  reports that she has never smoked. She has never used smokeless tobacco. She reports that she does not drink alcohol or use drugs.  Allergies  Allergen Reactions  . Sulfa Antibiotics Hives  . Sulfonamide Derivatives Hives    Family History  Problem Relation Age of Onset  . Heart disease Mother      Prior to Admission medications   Medication Sig Start Date End Date Taking? Authorizing Provider  acetaminophen (TYLENOL) 500 MG tablet Take 1,000 mg by mouth every 6 (six) hours as needed for mild pain or fever.     [provider]  Albuterol Sulfate (PROAIR RESPICLICK) 563 (90 Base) MCG/ACT AEPB Inhale 2 puffs into the lungs daily as needed (shortness of breath).    [provider]  alendronate (FOSAMAX) 70 MG tablet Take 1 tablet (70 mg total) by mouth once a week. Take with a full glass of water on an empty stomach.mon Patient taking differently: Take 70 mg by mouth every Monday. Take with a full glass of water on an empty stomach 10/08/15   Ilean China  aspirin EC 81 MG tablet Take 81 mg by mouth every evening.     [provider]  Calcium Carb-Cholecalciferol (CALCIUM 1000 + D PO) Take 1 tablet by mouth every evening.     [provider]  cephALEXin (KEFLEX) 250 MG capsule  01/04/18   [provider]  clopidogrel (PLAVIX) 75 MG tablet Take 1 tablet (75 mg total) by mouth daily. 06/06/14   Croitoru, Mihai, MD  CRANBERRY EXTRACT PO Take 500 mg by mouth 2 (two) times daily.      [provider]  fish oil-omega-3 fatty acids 1000 MG capsule Take 1 g by mouth every evening.     [provider]  ipratropium-albuterol (DUONEB) 0.5-2.5 (3) MG/3ML SOLN Take 3 mLs by nebulization every 4 (four) hours as needed. 06/16/16   Theodis Blaze, MD  isosorbide mononitrate (IMDUR) 30 MG 24 hr tablet TAKE 1/2 TABLET BY MOUTH DAILY 10/31/18   Croitoru, Dani Gobble, MD  levothyroxine (SYNTHROID, LEVOTHROID) 88 MCG tablet Take 1 tablet (88 mcg total) by mouth daily before breakfast. 06/13/17   Rai, Ripudeep K, MD  losartan (COZAAR) 25 MG tablet TAKE ONE TABLET BY MOUTH TWICE A DAY 02/16/18   Croitoru, Dani Gobble, MD  meclizine (ANTIVERT) 25 MG tablet Take 25 mg by mouth 3 (three) times daily as needed for dizziness.    [provider]  metoprolol succinate (TOPROL-XL) 25 MG 24 hr tablet TAKE ONE TABLET BY MOUTH DAILY 05/02/18   Lelon Perla, MD  Multiple Vitamin (MULITIVITAMIN WITH MINERALS) TABS Take 1 tablet by mouth daily with breakfast.    [provider]  nitroGLYCERIN (NITROSTAT) 0.4 MG SL tablet Place 1 tablet (0.4 mg total) under the tongue every 5 (five) minutes as needed for chest pain. 11/01/17   Croitoru, Mihai, MD  ondansetron (ZOFRAN ODT) 4 MG disintegrating tablet Take 1 tablet (4 mg total) by mouth every 6 (six) hours as needed for nausea or vomiting. 06/12/17   Rai, Vernelle Emerald, MD  OVER THE COUNTER MEDICATION See admin instructions. Immuplex Capsule: Take 1 capsule by mouth 2 times a day    [provider]  OVER THE COUNTER MEDICATION Take 1 capsule by mouth 2 (two) times daily. D-MANOS    [provider]  pantoprazole (PROTONIX) 40 MG tablet TAKE ONE TABLET BY MOUTH DAILY MUST KEEP APPOINTMENT 01/27/18   Croitoru, Mihai, MD  Polyethyl Glycol-Propyl Glycol (SYSTANE OP) Place 1 drop into both eyes 2 (two) times daily. Patient states its the Gel form (soothes)    [provider]  Probiotic CAPS Take 1 capsule by mouth daily with  breakfast.    [provider]  rosuvastatin (CRESTOR) 20 MG tablet TAKE ONE TABLET BY MOUTH DAILY 02/16/18   Croitoru, Mihai, MD  senna-docusate (SENOKOT-S) 8.6-50 MG tablet Take 1 tablet by mouth 2 (two) times daily. 06/12/17   Rai, Vernelle Emerald, MD  sertraline (ZOLOFT) 50 MG tablet Take 1 tablet (50 mg total) by mouth daily. 04/09/16   Croitoru, Mihai, MD  solifenacin (VESICARE) 5 MG tablet Take 1 tablet (5 mg total) by mouth every evening. Patient not taking: Reported on 01/31/2018 06/12/17   Mendel Corning, MD    Physical Exam: Vitals:   12/06/18 1703 12/06/18 1715 12/06/18 1759 12/06/18 1845  BP: (!) 157/75 (!) 144/82 (!) 166/71 (!) 160/68  Pulse: 67 64 61 63  Resp: 18 19 (!) 22 15  Temp: 98.3 F (36.8 C)     TempSrc: Oral     SpO2: 93% 92% 94% 94%    Constitutional: Elderly woman resting supine in bed, NAD, calm, comfortable Eyes: PERRL, lids and conjunctivae normal ENMT: Mucous membranes are moist. Posterior pharynx clear of any exudate or lesions.Normal dentition.  Neck: normal, supple, no masses. Respiratory: clear to auscultation anteriorly. Normal respiratory effort. No accessory muscle use.  Cardiovascular: Regular rate and rhythm, no murmurs / rubs / gallops. No extremity edema. 2+ pedal pulses. Abdomen: no tenderness, no masses palpated. No hepatosplenomegaly. Bowel sounds positive.  Musculoskeletal: ROM LLE limited due to pain, ROM of both upper extremities intact, able to raise RLE off the bed skin: no rashes, lesions, ulcers. No induration Neurologic: CN 2-12 grossly intact. Sensation intact, strength 5/5 in both upper extremities, limited both lower extremities due to pain from left hip Psychiatric: Normal judgment and insight. Alert and oriented x 3. Normal mood.   Labs on Admission: I have personally reviewed following labs and imaging studies  CBC: Recent Labs  Lab 12/06/18 1824  WBC 13.3*  NEUTROABS 9.5*  HGB 11.7*  HCT 36.5  MCV 98.1  PLT 979    Basic Metabolic Panel: Recent Labs  Lab 12/06/18 1824  NA 137  K 3.9  CL 100  CO2 27  GLUCOSE 122*  BUN 18  CREATININE 0.60  CALCIUM 9.1   GFR: CrCl cannot be calculated (Unknown ideal weight.). Liver Function Tests: No results for input(s): AST, ALT, ALKPHOS, BILITOT, PROT, ALBUMIN in the last 168 hours. No results for input(s): LIPASE, AMYLASE in the last 168 hours. No results for input(s): AMMONIA in the last 168 hours. Coagulation Profile: No results for input(s): INR, PROTIME in the last 168 hours. Cardiac Enzymes: No results for input(s): CKTOTAL, CKMB, CKMBINDEX, TROPONINI in the last 168 hours. BNP (last 3 results) No results for input(s): PROBNP in the last 8760 hours. HbA1C: No results for input(s): HGBA1C in the last 72 hours. CBG: No results for input(s): GLUCAP in the last 168 hours. Lipid Profile: No results for input(s): CHOL, HDL, LDLCALC, TRIG, CHOLHDL, LDLDIRECT in the last 72 hours. Thyroid Function Tests: No results for input(s): TSH, T4TOTAL, FREET4, T3FREE, THYROIDAB in the last 72 hours. Anemia Panel: No results for input(s): VITAMINB12, FOLATE, FERRITIN, TIBC, IRON, RETICCTPCT in the last 72 hours. Urine analysis:    Component Value Date/Time   COLORURINE YELLOW 12/06/2018 1824   APPEARANCEUR CLEAR 12/06/2018 1824   LABSPEC 1.018 12/06/2018 1824   PHURINE 6.0 12/06/2018 1824   GLUCOSEU NEGATIVE 12/06/2018 1824   HGBUR NEGATIVE 12/06/2018 1824   BILIRUBINUR NEGATIVE 12/06/2018 1824   KETONESUR NEGATIVE 12/06/2018 1824   PROTEINUR NEGATIVE 12/06/2018 1824   UROBILINOGEN 0.2 07/12/2012 1448   NITRITE NEGATIVE 12/06/2018 1824   LEUKOCYTESUR NEGATIVE 12/06/2018 1824    Radiological Exams on Admission: Ct Head Wo Contrast  Result Date: 12/06/2018 CLINICAL DATA:  Ct head wo, Pt brought in by ems from [facility] ; Pt fell approx 15 min prior to arrival; walking looked down bent over to pick up something and lost her balance and fell on  left side ; on Plavix Head trauma, minor, GCS>=13, high clinical risk, initial exam EXAM: CT HEAD WITHOUT CONTRAST TECHNIQUE: Contiguous axial images were obtained from the base of the skull through the vertex without intravenous contrast. COMPARISON:  03/16/2017 FINDINGS: Brain: No acute intracranial hemorrhage. No focal mass lesion. No CT evidence of acute infarction. No midline shift or mass effect. No hydrocephalus. Basilar cisterns are patent. There are periventricular and subcortical white matter hypodensities. Generalized cortical atrophy. Vascular: No hyperdense vessel or unexpected calcification. Skull: Normal. Negative for fracture or focal lesion. Sinuses/Orbits: Paranasal sinuses and mastoid air cells are clear. Orbits are clear. Other: None. IMPRESSION: 1. No acute intracranial findings. 2. Atrophy and chronic white matter microvascular disease. Electronically Signed   By: Suzy Bouchard M.D.   On: 12/06/2018 19:16   Dg Hip Unilat W Or Wo Pelvis 2-3 Views Left  Result Date: 12/06/2018 CLINICAL DATA:  Fall, hip pain EXAM: DG HIP (WITH OR WITHOUT PELVIS) 2-3V LEFT COMPARISON:  07/02/2017 FINDINGS: Prior left hip replacement. There is a fracture through the base of the greater trochanter. Minimal displacement. No subluxation or dislocation. IMPRESSION: Minimally displaced fracture at the base of the left greater trochanter. Prior left hip replacement. Electronically Signed   By: Rolm Baptise M.D.   On: 12/06/2018 18:18    EKG: Not performed  Assessment/Plan Principal Problem:   Closed displaced fracture of greater trochanter of left femur (HCC) Active Problems:   Hypothyroidism   Obstructive sleep apnea   CAD (coronary artery disease) of artery bypass graft   Diastolic dysfunction-grade 1 with EF 60%   Hyperlipidemia   History of stroke-March 2017 (rt brain)  Gina Bowers  is a 85 y.o. female with medical history significant for CAD status post CABG, chronic diastolic CHF, history  of CVA, hypertension hyperlipidemia, hypothyroidism, depression, and mild OSA not on CPAP who is admitted with a minimally displaced fracture of the greater trochanter of the left femur.   Minimally displaced fracture of the greater trochanter of the left femur: -Orthopedics consulted, to see in a.m. -Partial weightbearing, PT/OT eval -Pain control  CAD status post CABG: No active chest pain. -Continue aspirin 81 mg daily, Plavix 75 mg daily, Imdur, rosuvastatin, Toprol-XL  Chronic diastolic CHF: Appears euvolemic, not on diuretics as an outpatient.  Continue to monitor.  History of CVA: -Continue aspirin, Plavix, rosuvastatin  Hypertension: -Continue losartan, Toprol-XL, Imdur  Hyperlipidemia: -Continue rosuvastatin  Hypothyroidism: -Continue Synthroid -Check TSH  Mild OSA: Not requiring CPAP.  Use supplemental oxygen as needed.  DVT prophylaxis: SCDs Code Status: DNR, confirmed with patient and daughter Family Communication: Discussed with daughter [name] at bedside Disposition Plan: Pending orthopedic evaluation, PT/OT eval Consults called: Orthopedics Admission status: Inpatient for management of left femoral fracture.   Zada Finders MD [facility]  If 7PM-7AM, please contact night-coverage [email or web address]  12/06/2018, 7:49 PM

## 2018-12-06 NOTE — ED Provider Notes (Addendum)
[facility] EMERGENCY DEPARTMENT Provider Note   CSN: [id number] Arrival date & time: 12/06/18  1702    History   Chief Complaint No chief complaint on file.   HPI Gina Bowers is a 85 y.o. female with a past medical history of CAD, colitis, hypothyroidism, status post left hip replacement about 15 years ago presents to ED for mechanical fall that occurred approximately 1 hour prior to arrival.  States that she is ambulating with a walker when her foot got caught on her walker, she lost her balance and fell on her left side.  She first broke her left hip approximately 15 years ago which required surgery.  She then broke it again about 7 months ago but this healed with physical therapy.  She has not tried ambulating since then as she has pain with bearing weight.  She denies any head injuries, headache, vision changes, neck pain.     HPI  Past Medical History:  Diagnosis Date   Arthritis    Blood transfusion 2007   AFTER HIP REPLACEMENT   Carotid bruit    PT'S DAUGHTER STATES PT HAD RECENT CAROTID STUDY AND WAS TOLD NO SIGNIFICANT BLOCKAGES   Chronic diastolic CHF (congestive heart failure) ([place])    a. 07/2015 Ech: Ef 60-65%, Gr1 DD.   Coronary artery disease    a. 2009 CABG 4, Dr. Servando Snare (LIMA to LAD , SVG to diagonal, SVG to circumflex, SVG to PDA); b. Cath 2012 in  Recent ill,[place] patent LIMA , Patent SVG to diagonal, occluded SVG to RCA. 5x44mm BMS->native RCA; c. 09/2015 Cath: LM nl, LAD 124m, D1 100, LCX small, OM1 90/small, OM2 70, RCA 45p, patent stent, VG->dRCA 100, VG->OM1 100, VG->Diag nl, LIMA->LAD nl->Med Rx.   Depression    Fracture FEB 2013   FRACTURED LEFT ANKLE--NO SURGERY--WAS IN BOOT UNTIL COUPLE WEEKS AGO   GERD (gastroesophageal reflux disease)    High cholesterol    Hypothyroidism    Ischemic colitis ([place]) 2010   Kidney stones    IN THE PAST   Lung nodules    TOLD SHE HAS INTERSTITUAL LUNG DISEASE   [place] virus     COUPLE OF WEEKS AGO--ALL SYMTOMS RESOLVED PER PT   Obstructive sleep apnea 2010   Shortness of breath    AND WHEEZING AT TIMES--NO INHALERS   Stroke ([place])    SEVERAL STROKES -TOLD SHE HAS CEREBELLUM BLOCKAGE AS RESULT OF STROKE--BUT NEUROLOGIST SAID HE DID NOT WANT TO DO ANY PROCEDURE BECAUSE OF HER AGE .  PT HAS SLIGHT LEFT FOOT DROP-DRAGS FOOT WHEN WALKING - USES WALKER   Urinary incontinence    UTI (lower urinary tract infection)    HX OF UTI'S-- LAST TIME COUPLE OF MONTHS AGO   Vertigo     Patient Active Problem List   Diagnosis Date Noted   Pubic ramus fracture ([place]) 06/08/2017   Acetabulum fracture ([place]) 06/08/2017   Fall at home, initial encounter 06/08/2017   UTI (urinary tract infection) 06/10/2016   Acute pyelonephritis    SIRS (systemic inflammatory response syndrome) ([place])    Epistaxis 03/09/2016   Ocular migraine 11/04/2015   Essential hypertension 11/04/2015   HLD (hyperlipidemia) 11/04/2015   Coronary artery disease involving coronary bypass graft of native heart 11/04/2015   Fatigue 10/08/2015   Urinary tract infection 10/05/2015   Chest pain 10/04/2015   Hypertension 10/04/2015   Unstable angina (H[facility])    Hypertensive heart disease without heart failure    S/P CABG 2009-cath  10/07/15-medical Rx    S/P coronary artery stent placement-2012    Acute CVA (cerebrovascular accident) ([place]) 08/17/2015   Possible Renovascular hypertension 07/25/2015   Hyperlipidemia 07/25/2015   History of stroke-March 2017 (rt brain) 07/25/2015   Vertebrobasilar artery stenosis 07/25/2015   CAD (coronary artery disease) of artery bypass graft 95/28/4132   Diastolic dysfunction-grade 1 with EF 60% 06/06/2014   CVA (cerebral vascular accident) ([place]) 11/17/2011   TIA post cath 10/07/15 11/13/2011   Weakness 11/13/2011   Obstructive sleep apnea 11/23/2008   PULMONARY NODULE 11/23/2008   Hypoxemia 11/23/2008   Hypothyroidism 11/22/2008    Anxiety state 11/22/2008   Cerebral artery occlusion with cerebral infarction ([place]) 11/22/2008    Past Surgical History:  Procedure Laterality Date   ABDOMINAL HYSTERECTOMY     APPENDECTOMY     BLADDER [place] N/A 10/07/2015   Procedure: Left Heart Cath and Cors/Grafts Angiography;  Surgeon: Peter M Martinique, MD;  Location: [facility];  Service: Cardiovascular;  Laterality: N/A;   CATARACT EXTRACTION     CORONARY ANGIOPLASTY  10/2010   WITH STENT PLACEMENT   CORONARY ARTERY BYPASS GRAFT  2009   CYSTOSCOPY WITH INJECTION  09/21/2011   Procedure: CYSTOSCOPY WITH INJECTION;  Surgeon: Ailene Rud, MD;  Location: [facility];  Service: Urology;  Laterality: N/A;  Peri Urethral Macroplastique Injection and Estring Placement   HERNIA REPAIR     HIP SURGERY     JOINT REPLACEMENT  2007   HIP REPLACEMENT -LEFT   TONSILLECTOMY       OB History   No obstetric history on file.      Home Medications    Prior to Admission medications   Medication Sig Start Date End Date Taking? Authorizing Provider  acetaminophen (TYLENOL) 500 MG tablet Take 1,000 mg by mouth every 6 (six) hours as needed for mild pain or fever.     [provider]  Albuterol Sulfate (PROAIR RESPICLICK) 440 (90 Base) MCG/ACT AEPB Inhale 2 puffs into the lungs daily as needed (shortness of breath).    [provider]  alendronate (FOSAMAX) 70 MG tablet Take 1 tablet (70 mg total) by mouth once a week. Take with a full glass of water on an empty stomach.mon Patient taking differently: Take 70 mg by mouth every Monday. Take with a full glass of water on an empty stomach 10/08/15   Ilean China  aspirin EC 81 MG tablet Take 81 mg by mouth every evening.     [provider]  Calcium Carb-Cholecalciferol (CALCIUM 1000 + D PO) Take 1 tablet by mouth every evening.     [provider]  cephALEXin (KEFLEX) 250 MG capsule  01/04/18    [provider]  clopidogrel (PLAVIX) 75 MG tablet Take 1 tablet (75 mg total) by mouth daily. 06/06/14   Croitoru, Mihai, MD  CRANBERRY EXTRACT PO Take 500 mg by mouth 2 (two) times daily.     [provider]  fish oil-omega-3 fatty acids 1000 MG capsule Take 1 g by mouth every evening.     [provider]  ipratropium-albuterol (DUONEB) 0.5-2.5 (3) MG/3ML SOLN Take 3 mLs by nebulization every 4 (four) hours as needed. 06/16/16   Theodis Blaze, MD  isosorbide mononitrate (IMDUR) 30 MG 24 hr tablet TAKE 1/2 TABLET BY MOUTH DAILY 10/31/18   Croitoru, Mihai, MD  levothyroxine (SYNTHROID, LEVOTHROID) 88 MCG tablet Take 1 tablet (88  mcg total) by mouth daily before breakfast. 06/13/17   Rai, Ripudeep K, MD  losartan (COZAAR) 25 MG tablet TAKE ONE TABLET BY MOUTH TWICE A DAY 02/16/18   Croitoru, Mihai, MD  meclizine (ANTIVERT) 25 MG tablet Take 25 mg by mouth 3 (three) times daily as needed for dizziness.    [provider]  metoprolol succinate (TOPROL-XL) 25 MG 24 hr tablet TAKE ONE TABLET BY MOUTH DAILY 05/02/18   Lelon Perla, MD  Multiple Vitamin (MULITIVITAMIN WITH MINERALS) TABS Take 1 tablet by mouth daily with breakfast.    [provider]  nitroGLYCERIN (NITROSTAT) 0.4 MG SL tablet Place 1 tablet (0.4 mg total) under the tongue every 5 (five) minutes as needed for chest pain. 11/01/17   Croitoru, Mihai, MD  ondansetron (ZOFRAN ODT) 4 MG disintegrating tablet Take 1 tablet (4 mg total) by mouth every 6 (six) hours as needed for nausea or vomiting. 06/12/17   Rai, Vernelle Emerald, MD  OVER THE COUNTER MEDICATION See admin instructions. Immuplex Capsule: Take 1 capsule by mouth 2 times a day    [provider]  OVER THE COUNTER MEDICATION Take 1 capsule by mouth 2 (two) times daily. D-MANOS    [provider]  pantoprazole (PROTONIX) 40 MG tablet TAKE ONE TABLET BY MOUTH DAILY MUST KEEP APPOINTMENT 01/27/18   Croitoru, Mihai, MD  Polyethyl  Glycol-Propyl Glycol (SYSTANE OP) Place 1 drop into both eyes 2 (two) times daily. Patient states its the Gel form (soothes)    [provider]  Probiotic CAPS Take 1 capsule by mouth daily with breakfast.    [provider]  rosuvastatin (CRESTOR) 20 MG tablet TAKE ONE TABLET BY MOUTH DAILY 02/16/18   Croitoru, Mihai, MD  senna-docusate (SENOKOT-S) 8.6-50 MG tablet Take 1 tablet by mouth 2 (two) times daily. 06/12/17   Rai, Vernelle Emerald, MD  sertraline (ZOLOFT) 50 MG tablet Take 1 tablet (50 mg total) by mouth daily. 04/09/16   Croitoru, Mihai, MD  solifenacin (VESICARE) 5 MG tablet Take 1 tablet (5 mg total) by mouth every evening. Patient not taking: Reported on 01/31/2018 06/12/17   Mendel Corning, MD    Family History Family History  Problem Relation Age of Onset   Heart disease Mother     Social History Social History   Tobacco Use   Smoking status: Never Smoker   Smokeless tobacco: Never Used  Substance Use Topics   Alcohol use: No   Drug use: No     Allergies   Sulfa antibiotics and Sulfonamide derivatives   Review of Systems Review of Systems  Constitutional: Negative for appetite change, chills and fever.  HENT: Negative for ear pain, rhinorrhea, sneezing and sore throat.   Eyes: Negative for photophobia and visual disturbance.  Respiratory: Negative for cough, chest tightness, shortness of breath and wheezing.   Cardiovascular: Negative for chest pain and palpitations.  Gastrointestinal: Negative for abdominal pain, blood in stool, constipation, diarrhea, nausea and vomiting.  Genitourinary: Negative for dysuria, hematuria and urgency.  Musculoskeletal: Positive for arthralgias. Negative for myalgias.  Skin: Negative for rash.  Neurological: Negative for dizziness, weakness and light-headedness.     Physical Exam Updated Vital Signs BP (!) 160/68    Pulse 63    Temp 98.3 F (36.8 C) (Oral)    Resp 15    SpO2 94%   Physical Exam Vitals  signs and nursing note reviewed.  Constitutional:      General: She is not in acute distress.  Appearance: She is well-developed.  HENT:     Head: Normocephalic and atraumatic.     Nose: Nose normal.  Eyes:     General: No scleral icterus.       Left eye: No discharge.     Conjunctiva/sclera: Conjunctivae normal.  Neck:     Musculoskeletal: Normal range of motion and neck supple.  Cardiovascular:     Rate and Rhythm: Normal rate and regular rhythm.     Heart sounds: Normal heart sounds. No murmur. No friction rub. No gallop.   Pulmonary:     Effort: Pulmonary effort is normal. No respiratory distress.     Breath sounds: Normal breath sounds.  Abdominal:     General: Bowel sounds are normal. There is no distension.     Palpations: Abdomen is soft.     Tenderness: There is no abdominal tenderness. There is no guarding.  Musculoskeletal: Normal range of motion.        General: Tenderness (Left hip) present.     Comments: Limited range of motion of left knee and hip secondary to pain.  2+ DP pulse palpated bilaterally.  No deformity noted.  Skin:    General: Skin is warm and dry.     Findings: No rash.  Neurological:     General: No focal deficit present.     Mental Status: She is alert and oriented to person, place, and time.     Cranial Nerves: No cranial nerve deficit.     Sensory: No sensory deficit.     Motor: No weakness or abnormal muscle tone.     Coordination: Coordination normal.      ED Treatments / Results  Labs (all labs ordered are listed, but only abnormal results are displayed) Labs Reviewed  BASIC METABOLIC PANEL - Abnormal; Notable for the following components:      Result Value   Glucose, Bld 122 (*)    All other components within normal limits  CBC WITH DIFFERENTIAL/PLATELET - Abnormal; Notable for the following components:   WBC 13.3 (*)    RBC 3.72 (*)    Hemoglobin 11.7 (*)    Neutro Abs 9.5 (*)    All other components within normal limits    URINE CULTURE  SARS CORONAVIRUS 2 (HOSPITAL ORDER, [facility])  URINALYSIS, ROUTINE W REFLEX MICROSCOPIC    EKG None  Radiology Ct Head Wo Contrast  Result Date: 12/06/2018 CLINICAL DATA:  Ct head wo, Pt brought in by ems from [facility] ; Pt fell approx 15 min prior to arrival; walking looked down bent over to pick up something and lost her balance and fell on left side ; on Plavix Head trauma, minor, GCS>=13, high clinical risk, initial exam EXAM: CT HEAD WITHOUT CONTRAST TECHNIQUE: Contiguous axial images were obtained from the base of the skull through the vertex without intravenous contrast. COMPARISON:  03/16/2017 FINDINGS: Brain: No acute intracranial hemorrhage. No focal mass lesion. No CT evidence of acute infarction. No midline shift or mass effect. No hydrocephalus. Basilar cisterns are patent. There are periventricular and subcortical white matter hypodensities. Generalized cortical atrophy. Vascular: No hyperdense vessel or unexpected calcification. Skull: Normal. Negative for fracture or focal lesion. Sinuses/Orbits: Paranasal sinuses and mastoid air cells are clear. Orbits are clear. Other: None. IMPRESSION: 1. No acute intracranial findings. 2. Atrophy and chronic white matter microvascular disease. Electronically Signed   By: Suzy Bouchard M.D.   On: 12/06/2018 19:16   Dg Hip Unilat W Or  Wo Pelvis 2-3 Views Left  Result Date: 12/06/2018 CLINICAL DATA:  Fall, hip pain EXAM: DG HIP (WITH OR WITHOUT PELVIS) 2-3V LEFT COMPARISON:  07/02/2017 FINDINGS: Prior left hip replacement. There is a fracture through the base of the greater trochanter. Minimal displacement. No subluxation or dislocation. IMPRESSION: Minimally displaced fracture at the base of the left greater trochanter. Prior left hip replacement. Electronically Signed   By: Rolm Baptise M.D.   On: 12/06/2018 18:18    Procedures Procedures (including critical care time)  Medications  Ordered in ED Medications - No data to display   Initial Impression / Assessment and Plan / ED Course  I have reviewed the triage vital signs and the nursing notes.  Pertinent labs & imaging results that were available during my care of the patient were reviewed by me and considered in my medical decision making (see chart for details).        85 year old female who is status post left hip replacement 15 years ago presents to ED for left hip pain after mechanical fall that occurred prior to arrival.  States that her foot got tangled in her walker earlier today.  She fell onto her left side.  She has not been able to ambulate since then.  Denies any head injury or loss of consciousness.  Left hip is tender to palpation with limited range of motion but no deformity noted.  She is hemodynamically stable and alert and oriented.  Lab work is reassuring.  X-ray of the hip shows minimally displaced fracture at the base of the left greater trochanter.  CT of the head is negative.  I spoke to Dr. Lorin Mercy of [facility] who is on-call today.  He reviewed the images states that patient is reasonable for admission for partial weightbearing, physical therapy and that he will evaluate her in the morning.  Patient is agreeable to admission.  Pain is controlled as she is laying down and she declines pain medication.    Final Clinical Impressions(s) / ED Diagnoses   Final diagnoses:  Closed fracture of left hip, initial encounter [facility])    ED Discharge Orders    None           Delia Heady, PA-C 12/06/18 1942    Veryl Speak, MD 12/08/18 1535

## 2018-12-07 ENCOUNTER — Encounter (HOSPITAL_COMMUNITY): Payer: Self-pay | Admitting: *Deleted

## 2018-12-07 ENCOUNTER — Telehealth: Payer: Self-pay | Admitting: Orthopaedic Surgery

## 2018-12-07 DIAGNOSIS — S72112A Displaced fracture of greater trochanter of left femur, initial encounter for closed fracture: Principal | ICD-10-CM

## 2018-12-07 DIAGNOSIS — E039 Hypothyroidism, unspecified: Secondary | ICD-10-CM

## 2018-12-07 DIAGNOSIS — S72002A Fracture of unspecified part of neck of left femur, initial encounter for closed fracture: Secondary | ICD-10-CM

## 2018-12-07 DIAGNOSIS — E7849 Other hyperlipidemia: Secondary | ICD-10-CM

## 2018-12-07 DIAGNOSIS — I5189 Other ill-defined heart diseases: Secondary | ICD-10-CM

## 2018-12-07 LAB — CBC
HCT: 33.1 % — ABNORMAL LOW (ref 36.0–46.0)
Hemoglobin: 10.8 g/dL — ABNORMAL LOW (ref 12.0–15.0)
MCH: 30.9 pg (ref 26.0–34.0)
MCHC: 32.6 g/dL (ref 30.0–36.0)
MCV: 94.6 fL (ref 80.0–100.0)
Platelets: 176 10*3/uL (ref 150–400)
RBC: 3.5 MIL/uL — ABNORMAL LOW (ref 3.87–5.11)
RDW: 12.5 % (ref 11.5–15.5)
WBC: 10.8 10*3/uL — ABNORMAL HIGH (ref 4.0–10.5)
nRBC: 0 % (ref 0.0–0.2)

## 2018-12-07 LAB — BASIC METABOLIC PANEL
Anion gap: 9 (ref 5–15)
BUN: 17 mg/dL (ref 8–23)
CO2: 26 mmol/L (ref 22–32)
Calcium: 8.6 mg/dL — ABNORMAL LOW (ref 8.9–10.3)
Chloride: 101 mmol/L (ref 98–111)
Creatinine, Ser: 0.51 mg/dL (ref 0.44–1.00)
GFR calc Af Amer: >60 mL/min (ref >60)
GFR calc non Af Amer: >60 mL/min (ref >60)
Glucose, Bld: 116 mg/dL — ABNORMAL HIGH (ref 70–99)
Potassium: 3.5 mmol/L (ref 3.5–5.1)
Sodium: 136 mmol/L (ref 135–145)

## 2018-12-07 LAB — TSH: TSH: 3.356 u[IU]/mL (ref 0.350–4.500)

## 2018-12-07 MED ORDER — ENOXAPARIN SODIUM 40 MG/0.4ML ~~LOC~~ SOLN
40.0000 mg | SUBCUTANEOUS | Status: DC
  Administered 2018-12-07 – 2018-12-08 (×2): 40 mg via SUBCUTANEOUS
  Filled 2018-12-07 (×2): qty 0.4

## 2018-12-07 NOTE — Telephone Encounter (Signed)
Patient's daughter [name] [employer]) called asked if Dr Lorin Mercy can call her and let her know the treatment plan for her mother. The number to contact [name] is [phone]

## 2018-12-07 NOTE — Progress Notes (Signed)
PROGRESS NOTE    Gina Bowers  PRX:[id number] DOB: Jul 09, 1925 DOA: 12/06/2018 PCP: Lavone Orn, MD    Brief Narrative:  85 year old female with a history of coronary artery disease, diastolic heart failure, hypertension, hyperlipidemia, patient suffered a fall while at home she was found to have left-sided hip fracture.  She was seen by orthopedics and it was felt that nonoperative management would be most appropriate.   Assessment & Plan:   Principal Problem:   Closed displaced fracture of greater trochanter of left femur (HCC) Active Problems:   Hypothyroidism   Obstructive sleep apnea   CAD (coronary artery disease) of artery bypass graft   Diastolic dysfunction-grade 1 with EF 60%   Hyperlipidemia   History of stroke-March 2017 (rt brain)   Closed fracture of left hip ([place])   1. Non displaced fracture of let greater trochanteric fracture.  Seen by orthopedics.  Plans are for nonoperative management.  Continue pain management and physical therapy.  Discussed with patient's daughter who wishes to take the patient home.  She says that she can provide 24-hour care for the patient.  She would rather the patient be at home and have home health PT.  We will plan on possible transfer home once the patient is able to somewhat ambulate. 2. Chronic diastolic congestive heart failure.  Appears euvolemic.  She is not on chronic diuretics. 3. Coronary artery disease.  No complaints of chest pain.  Continue aspirin, Plavix, Imdur, statin, beta-blocker 4. Hypertension.  Stable on losartan, beta-blocker and Imdur. 5. Hyperlipidemia.  Continue statin. 6. Hypothyroidism.  Continue on Synthroid.  TSH normal.   DVT prophylaxis: lovenox Code Status: DNR Family Communication: discussed with daughter Disposition Plan: return home with home health services   Consultants:   Orthopedics, Dr Lorin Mercy  Procedures:     Antimicrobials:       Subjective: Sitting up in chair. Continues to  have pain in left hip  Objective: Vitals:   12/07/18 0525 12/07/18 0600 12/07/18 0833 12/07/18 1424  BP: (!) 151/61  (!) 152/64 (!) 150/66  Pulse: 63  63 65  Resp: 16  18 16   Temp: 98 F (36.7 C)  98.3 F (36.8 C) 99.1 F (37.3 C)  TempSrc: Oral  Oral Oral  SpO2: 95%  97% 98%  Weight:  76.4 kg    Height:  5\' 1"  (1.549 m)      Intake/Output Summary (Last 24 hours) at 12/07/2018 1924 Last data filed at 12/07/2018 1804 Gross per 24 hour  Intake 720 ml  Output 1050 ml  Net -330 ml   Filed Weights   12/07/18 0600  Weight: 76.4 kg    Examination:  General exam: Appears calm and comfortable  Respiratory system: Clear to auscultation. Respiratory effort normal. Cardiovascular system: S1 & S2 heard, RRR. No JVD, murmurs, rubs, gallops or clicks. No pedal edema. Gastrointestinal system: Abdomen is nondistended, soft and nontender. No organomegaly or masses felt. Normal bowel sounds heard. Central nervous system: Alert and oriented. No focal neurological deficits. Extremities: Symmetric 5 x 5 power. Skin: No rashes, lesions or ulcers Psychiatry: Judgement and insight appear normal. Mood & affect appropriate.     Data Reviewed: I have personally reviewed following labs and imaging studies  CBC: Recent Labs  Lab 12/06/18 1824 12/07/18 0358  WBC 13.3* 10.8*  NEUTROABS 9.5*  --   HGB 11.7* 10.8*  HCT 36.5 33.1*  MCV 98.1 94.6  PLT 217 462   Basic Metabolic Panel: Recent Labs  Lab 12/06/18  1824 12/07/18 0358  NA 137 136  K 3.9 3.5  CL 100 101  CO2 27 26  GLUCOSE 122* 116*  BUN 18 17  CREATININE 0.60 0.51  CALCIUM 9.1 8.6*   GFR: Estimated Creatinine Clearance: 42.8 mL/min (by C-G formula based on SCr of 0.51 mg/dL). Liver Function Tests: No results for input(s): AST, ALT, ALKPHOS, BILITOT, PROT, ALBUMIN in the last 168 hours. No results for input(s): LIPASE, AMYLASE in the last 168 hours. No results for input(s): AMMONIA in the last 168 hours. Coagulation  Profile: No results for input(s): INR, PROTIME in the last 168 hours. Cardiac Enzymes: No results for input(s): CKTOTAL, CKMB, CKMBINDEX, TROPONINI in the last 168 hours. BNP (last 3 results) No results for input(s): PROBNP in the last 8760 hours. HbA1C: No results for input(s): HGBA1C in the last 72 hours. CBG: No results for input(s): GLUCAP in the last 168 hours. Lipid Profile: No results for input(s): CHOL, HDL, LDLCALC, TRIG, CHOLHDL, LDLDIRECT in the last 72 hours. Thyroid Function Tests: Recent Labs    12/06/18 2318  TSH 3.356   Anemia Panel: No results for input(s): VITAMINB12, FOLATE, FERRITIN, TIBC, IRON, RETICCTPCT in the last 72 hours. Sepsis Labs: No results for input(s): PROCALCITON, LATICACIDVEN in the last 168 hours.  Recent Results (from the past 240 hour(s))  Urine culture     Status: Abnormal (Preliminary result)   Collection Time: 12/06/18  6:28 PM   Specimen: Urine, Clean Catch  Result Value Ref Range Status   Specimen Description URINE, CLEAN CATCH  Final   Special Requests NONE  Final   Culture (A)  Final    >=100,000 COLONIES/mL ENTEROCOCCUS FAECALIS SUSCEPTIBILITIES TO FOLLOW Performed at [facility], [address], [place], [place] [address]    Report Status PENDING  Incomplete  SARS Coronavirus 2 (CEPHEID - Performed in [place] hospital lab), Hosp Order     Status: None   Collection Time: 12/06/18  6:44 PM   Specimen: Nasopharyngeal Swab  Result Value Ref Range Status   SARS Coronavirus 2 NEGATIVE NEGATIVE Final    Comment: (NOTE) If result is NEGATIVE SARS-CoV-2 target nucleic acids are NOT DETECTED. The SARS-CoV-2 RNA is generally detectable in upper and lower  respiratory specimens during the acute phase of infection. The lowest  concentration of SARS-CoV-2 viral copies this assay can detect is 250  copies / mL. A negative result does not preclude SARS-CoV-2 infection  and should not be used as the sole basis for treatment or  other  patient management decisions.  A negative result may occur with  improper specimen collection / handling, submission of specimen other  than nasopharyngeal swab, presence of viral mutation(s) within the  areas targeted by this assay, and inadequate number of viral copies  (<250 copies / mL). A negative result must be combined with clinical  observations, patient history, and epidemiological information. If result is POSITIVE SARS-CoV-2 target nucleic acids are DETECTED. The SARS-CoV-2 RNA is generally detectable in upper and lower  respiratory specimens dur ing the acute phase of infection.  Positive  results are indicative of active infection with SARS-CoV-2.  Clinical  correlation with patient history and other diagnostic information is  necessary to determine patient infection status.  Positive results do  not rule out bacterial infection or co-infection with other viruses. If result is PRESUMPTIVE POSTIVE SARS-CoV-2 nucleic acids MAY BE PRESENT.   A presumptive positive result was obtained on the submitted specimen  and confirmed on repeat testing.  While 2019 novel coronavirus  (SARS-CoV-2) nucleic acids may be present in the submitted sample  additional confirmatory testing may be necessary for epidemiological  and / or clinical management purposes  to differentiate between  SARS-CoV-2 and other Sarbecovirus currently known to infect humans.  If clinically indicated additional testing with an alternate test  methodology [phone]) is advised. The SARS-CoV-2 RNA is generally  detectable in upper and lower respiratory sp ecimens during the acute  phase of infection. The expected result is Negative. Fact Sheet for Patients:  StrictlyIdeas.no Fact Sheet for Healthcare Providers: BankingDealers.co.za This test is not yet approved or cleared by the [place] FDA and has been authorized for detection and/or diagnosis of  SARS-CoV-2 by FDA under an Emergency Use Authorization (EUA).  This EUA will remain in effect (meaning this test can be used) for the duration of the COVID-19 declaration under Section 564(b)(1) of the Act, 21 U.S.C. section 360bbb-3(b)(1), unless the authorization is terminated or revoked sooner. Performed at [facility], [place] [address], [place], [place] [address]          Radiology Studies: Ct Head Wo Contrast  Result Date: 12/06/2018 CLINICAL DATA:  Ct head wo, Pt brought in by ems from [facility] ; Pt fell approx 15 min prior to arrival; walking looked down bent over to pick up something and lost her balance and fell on left side ; on Plavix Head trauma, minor, GCS>=13, high clinical risk, initial exam EXAM: CT HEAD WITHOUT CONTRAST TECHNIQUE: Contiguous axial images were obtained from the base of the skull through the vertex without intravenous contrast. COMPARISON:  03/16/2017 FINDINGS: Brain: No acute intracranial hemorrhage. No focal mass lesion. No CT evidence of acute infarction. No midline shift or mass effect. No hydrocephalus. Basilar cisterns are patent. There are periventricular and subcortical white matter hypodensities. Generalized cortical atrophy. Vascular: No hyperdense vessel or unexpected calcification. Skull: Normal. Negative for fracture or focal lesion. Sinuses/Orbits: Paranasal sinuses and mastoid air cells are clear. Orbits are clear. Other: None. IMPRESSION: 1. No acute intracranial findings. 2. Atrophy and chronic white matter microvascular disease. Electronically Signed   By: Suzy Bouchard M.D.   On: 12/06/2018 19:16   Dg Chest Portable 1 View  Result Date: 12/06/2018 CLINICAL DATA:  Hip fracture preoperative radiograph. History of sleep apnea, lung nodules, coronary artery disease, CABG, diastolic CHF. EXAM: PORTABLE CHEST 1 VIEW COMPARISON:  Chest radiograph 05/25/2018, chest CT 06/12/2016 FINDINGS: Low lung volumes with hypoventilatory changes in  the lung bases. Lungs are otherwise clear. No visualized nodules or masses. Chronically coarsened interstitium. No pneumothorax. No effusion. Post CABG changes including intact and aligned median sternotomy wires and surgical material projecting over the cardiac silhouette. Cardiomediastinal contours are similar to prior studies. Cardiac monitoring leads overlie the chest. Linear radiodensity over the right shoulder likely reflects a mask wire. Degenerative changes in the imaged shoulders and spine. No acute osseous abnormality. IMPRESSION: 1. Low lung volumes with hypoventilatory changes in the lung bases. No acute cardiopulmonary disease. 2. No convincing evidence of pulmonary edema. Electronically Signed   By: Lovena Le M.D.   On: 12/06/2018 20:06   Dg Hip Unilat W Or Wo Pelvis 2-3 Views Left  Result Date: 12/06/2018 CLINICAL DATA:  Fall, hip pain EXAM: DG HIP (WITH OR WITHOUT PELVIS) 2-3V LEFT COMPARISON:  07/02/2017 FINDINGS: Prior left hip replacement. There is a fracture through the base of the greater trochanter. Minimal displacement. No subluxation or dislocation. IMPRESSION: Minimally displaced fracture at the base  of the left greater trochanter. Prior left hip replacement. Electronically Signed   By: Rolm Baptise M.D.   On: 12/06/2018 18:18        Scheduled Meds:  aspirin EC  81 mg Oral QPM   clopidogrel  75 mg Oral Daily   isosorbide mononitrate  15 mg Oral Daily   levothyroxine  88 mcg Oral Q0600   losartan  25 mg Oral BID   metoprolol succinate  25 mg Oral Daily   pantoprazole  40 mg Oral Daily   rosuvastatin  20 mg Oral QPM   sertraline  50 mg Oral QPM   Continuous Infusions:   LOS: 1 day    Time spent: 32mins    Kathie Dike, MD [facility]   If 7PM-7AM, please contact night-coverage [email or web address]  12/07/2018, 7:24 PM

## 2018-12-07 NOTE — NC FL2 (Signed)
[place] LEVEL OF CARE SCREENING TOOL     IDENTIFICATION  Patient Name: Gina Bowers Birthdate: Oct 21, 1925 Sex: female Admission Date (Current Location): 12/06/2018  [facility] and [place] Number:  Herbalist and Address:  The . [facility], [place] [address], [place], [place] [address]      Provider Number: [id number]  Attending Physician Name and Address:  Kathie Dike, MD  Relative Name and Phone Number:  Adron Bene, daughter, [phone]    Current Level of Care: Hospital Recommended Level of Care: [place] Prior Approval Number:    Date Approved/Denied:   PASRR Number: [id number]  Discharge Plan: SNF    Current Diagnoses: Patient Active Problem List   Diagnosis Date Noted  . Closed fracture of left hip ([place])   . Closed displaced fracture of greater trochanter of left femur ([place]) 12/06/2018  . Pubic ramus fracture ([place]) 06/08/2017  . Acetabulum fracture ([place]) 06/08/2017  . Fall at home, initial encounter 06/08/2017  . UTI (urinary tract infection) 06/10/2016  . Acute pyelonephritis   . SIRS (systemic inflammatory response syndrome) (HCC)   . Epistaxis 03/09/2016  . Ocular migraine 11/04/2015  . Essential hypertension 11/04/2015  . HLD (hyperlipidemia) 11/04/2015  . Coronary artery disease involving coronary bypass graft of native heart 11/04/2015  . Fatigue 10/08/2015  . Urinary tract infection 10/05/2015  . Chest pain 10/04/2015  . Hypertension 10/04/2015  . Unstable angina ([place])   . Hypertensive heart disease without heart failure   . S/P CABG 2009-cath 10/07/15-medical Rx   . S/P coronary artery stent placement-2012   . Acute CVA (cerebrovascular accident) ([place]) 08/17/2015  . Possible Renovascular hypertension 07/25/2015  . Hyperlipidemia 07/25/2015  . History of stroke-March 2017 (rt brain) 07/25/2015  . Vertebrobasilar artery stenosis 07/25/2015  . CAD (coronary artery disease) of artery  bypass graft 06/06/2014  . Diastolic dysfunction-grade 1 with EF 60% 06/06/2014  . CVA (cerebral vascular accident) ([place]) 11/17/2011  . TIA post cath 10/07/15 11/13/2011  . Weakness 11/13/2011  . Obstructive sleep apnea 11/23/2008  . PULMONARY NODULE 11/23/2008  . Hypoxemia 11/23/2008  . Hypothyroidism 11/22/2008  . Anxiety state 11/22/2008  . Cerebral artery occlusion with cerebral infarction ([place]) 11/22/2008    Orientation RESPIRATION BLADDER Height & Weight     Self, Time, Situation, Place  Normal Continent, External catheter Weight: 168 lb 6.9 oz (76.4 kg) Height:  5\' 1"  (154.9 cm)  BEHAVIORAL SYMPTOMS/MOOD NEUROLOGICAL BOWEL NUTRITION STATUS      Continent Diet(see discharge summary)  AMBULATORY STATUS COMMUNICATION OF NEEDS Skin   Extensive Assist Verbally Normal                       Personal Care Assistance Level of Assistance  Feeding, Dressing, Bathing Bathing Assistance: Maximum assistance Feeding assistance: Independent Dressing Assistance: Maximum assistance     Functional Limitations Info  Sight, Hearing, Speech Sight Info: Adequate Hearing Info: Adequate Speech Info: Adequate    SPECIAL CARE FACTORS FREQUENCY  OT (By licensed OT), PT (By licensed PT)     PT Frequency: 5x week OT Frequency: 5x week            Contractures Contractures Info: Not present    Additional Factors Info  Code Status, Allergies, Psychotropic Code Status Info: DNR Allergies Info: Meclizine, Nitrofurantoin, Sulfa Antibiotics, Sulfonamide, Derivatives Psychotropic Info: sertraline (ZOLOFT) tablet 50 mg PO every evening         Current Medications (12/07/2018):  This  is the current hospital active medication list Current Facility-Administered Medications  Medication Dose Route Frequency Provider Last Rate Last Dose  . acetaminophen (TYLENOL) tablet 650 mg  650 mg Oral Q6H PRN Lenore Cordia, MD   650 mg at 12/07/18 0849   Or  . acetaminophen (TYLENOL) suppository  650 mg  650 mg Rectal Q6H PRN Zada Finders R, MD      . albuterol (PROVENTIL) (2.5 MG/3ML) 0.083% nebulizer solution 2.5 mg  2.5 mg Inhalation Daily PRN Lenore Cordia, MD      . aspirin EC tablet 81 mg  81 mg Oral QPM Patel, Vishal R, MD      . clopidogrel (PLAVIX) tablet 75 mg  75 mg Oral Daily Lenore Cordia, MD   75 mg at 12/07/18 0849  . isosorbide mononitrate (IMDUR) 24 hr tablet 15 mg  15 mg Oral Daily Lenore Cordia, MD   15 mg at 12/07/18 0850  . levothyroxine (SYNTHROID) tablet 88 mcg  88 mcg Oral Q0600 Lenore Cordia, MD   88 mcg at 12/07/18 6812  . losartan (COZAAR) tablet 25 mg  25 mg Oral BID Lenore Cordia, MD   25 mg at 12/07/18 0850  . metoprolol succinate (TOPROL-XL) 24 hr tablet 25 mg  25 mg Oral Daily Lenore Cordia, MD   25 mg at 12/07/18 0849  . ondansetron (ZOFRAN) tablet 4 mg  4 mg Oral Q6H PRN Lenore Cordia, MD       Or  . ondansetron (ZOFRAN) injection 4 mg  4 mg Intravenous Q6H PRN Zada Finders R, MD      . oxyCODONE (Oxy IR/ROXICODONE) immediate release tablet 5 mg  5 mg Oral Q4H PRN Zada Finders R, MD      . pantoprazole (PROTONIX) EC tablet 40 mg  40 mg Oral Daily Lenore Cordia, MD   40 mg at 12/07/18 0849  . rosuvastatin (CRESTOR) tablet 20 mg  20 mg Oral QPM Zada Finders R, MD   20 mg at 12/06/18 2326  . sertraline (ZOLOFT) tablet 50 mg  50 mg Oral QPM Lenore Cordia, MD   50 mg at 12/06/18 2327     Discharge Medications: Please see discharge summary for a list of discharge medications.  Relevant Imaging Results:  Relevant Lab Results:   Additional Information SS#: [place], [place]

## 2018-12-07 NOTE — Social Work (Signed)
Pt had "legal guardian" banner.  Per flowsheets pt is A&Ox4; upon chart review there are no documents indicating that pt has a court appointed legal guardian. Banner deactivated.  Westley Hummer, MSW, [place] Work [phone]

## 2018-12-07 NOTE — Plan of Care (Signed)
  Problem: Education: Goal: Knowledge of General Education information will improve Description Including pain rating scale, medication(s)/side effects and non-pharmacologic comfort measures Outcome: Progressing   

## 2018-12-07 NOTE — Progress Notes (Signed)
Occupational Therapy Treatment Patient Details Name: Gina Bowers MRN: [id number] DOB: March 01, 1926 Today's Date: 12/07/2018    History of present illness Pt is a 85 year old woman who resides at [facility]. She bent down to pick something up from the floor and fell resulting in L non displaced greater trochanteric hip fx with plan for conservative tx and 50% WB. PMH: dementia, CAD, CVA, CHF, CABG, sleep apnea, falls, depression.   OT comments  Pt walks with a rollator and is assisted for showering, medications, housekeeping and meals. She presents with L hip pain, generalized weakness, decreased standing balance and impaired cognition. She requires 2 person assist for OOB mobility and up to total assist for ADL. Pt will need ST rehab in SNF upon discharge. Will follow acutely.  Follow Up Recommendations  SNF;Supervision/Assistance - 24 hour    Equipment Recommendations       Recommendations for Other Services      Precautions / Restrictions Precautions Precautions: Fall Restrictions Weight Bearing Restrictions: Yes LLE Weight Bearing: Partial weight bearing LLE Partial Weight Bearing Percentage or Pounds: 50%       Mobility Bed Mobility Overal bed mobility: Needs Assistance Bed Mobility: Supine to Sit     Supine to sit: Max assist     General bed mobility comments: MaxA to assist LLE, scoot hips and elevate trunk; pt able to initiate movement well  Transfers Overall transfer level: Needs assistance Equipment used: Rolling walker (2 wheeled) Transfers: Sit to/from Stand Sit to Stand: Max assist;+2 physical assistance         General transfer comment: MaxA+2 for trunk elevation to RW, pt automatically performing correct hand placement; pt limited by pain    Balance Overall balance assessment: Needs assistance   Sitting balance-Leahy Scale: Fair     Standing balance support: Bilateral upper extremity supported Standing balance-Leahy Scale: Poor                              ADL either performed or assessed with clinical judgement   ADL Overall ADL's : Needs assistance/impaired Eating/Feeding: Independent;Sitting   Grooming: Set up;Sitting   Upper Body Bathing: Set up;Sitting   Lower Body Bathing: Total assistance;+2 for physical assistance;Sit to/from stand   Upper Body Dressing : Set up;Sitting   Lower Body Dressing: +2 for physical assistance;Total assistance;Sit to/from stand   Toilet Transfer: +2 for physical assistance;Maximal assistance;Stand-pivot;RW   Toileting- Clothing Manipulation and Hygiene: +2 for physical assistance;Total assistance;Sit to/from stand               Vision Baseline Vision/History: Wears glasses Wears Glasses: At all times Patient Visual Report: No change from baseline     Perception     Praxis      Cognition Arousal/Alertness: Awake/alert Behavior During Therapy: WFL for tasks assessed/performed Overall Cognitive Status: No family/caregiver present to determine baseline cognitive functioning                                 General Comments: WFL for simple tasks; pleasant, interacting and following commands appropriately. Some slowed processing, likely baseline cognition        Exercises     Shoulder Instructions       General Comments      Pertinent Vitals/ Pain       Pain Assessment: Faces Faces Pain Scale: Hurts whole lot Pain Location: LLE  Pain Descriptors / Indicators: Grimacing;Guarding Pain Intervention(s): Monitored during session;Repositioned  Home Living Family/patient expects to be discharged to:: Assisted living Living Arrangements: Alone Available Help at Discharge: Personal care attendant;Family;Available PRN/intermittently Type of Home: Independent living facility Home Access: Level entry;Elevator     Home Layout: One level     Bathroom Shower/Tub: Occupational psychologist: Handicapped height     Home Equipment:  Environmental consultant - 4 wheels;Shower seat;Hand held shower head;Grab bars - tub/shower;Grab bars - toilet   Additional Comments: [place]      Prior Functioning/Environment Level of Independence: Needs assistance  Gait / Transfers Assistance Needed: Ambulatory with rollator ADL's / Homemaking Assistance Needed: Caregiver assists with bathing and dressing; daughter assists with medication management. Meals provided       Frequency  Min 2X/week        Progress Toward Goals  OT Goals(current goals can now be found in the care plan section)     Acute Rehab OT Goals Patient Stated Goal: Agreeable to rehab at SNF before return to ILF OT Goal Formulation: With patient Time For Goal Achievement: 12/21/18 Potential to Achieve Goals: Good ADL Goals Pt Will Transfer to Toilet: with min assist;ambulating;bedside commode Pt Will Perform Toileting - Clothing Manipulation and hygiene: with min assist;sit to/from stand Additional ADL Goal #1: Pt will perform bed mobility with min assist in preparation for ADL. Additional ADL Goal #2: Pt will adhere to 50% WB on L LE with min verbal cues.  Plan      Co-evaluation    PT/OT/SLP Co-Evaluation/Treatment: Yes Reason for Co-Treatment: For patient/therapist safety PT goals addressed during session: Mobility/safety with mobility;Balance;Proper use of DME OT goals addressed during session: ADL's and self-care      AM-PAC OT "6 Clicks" Daily Activity     Outcome Measure   Help from another person eating meals?: None Help from another person taking care of personal grooming?: A Little Help from another person toileting, which includes using toliet, bedpan, or urinal?: Total Help from another person bathing (including washing, rinsing, drying)?: A Lot Help from another person to put on and taking off regular upper body clothing?: A Little Help from another person to put on and taking off regular lower body clothing?: Total 6 Click Score:  14    End of Session Equipment Utilized During Treatment: Gait belt;Rolling walker  OT Visit Diagnosis: Unsteadiness on feet (R26.81);Other abnormalities of gait and mobility (R26.89);Pain;Muscle weakness (generalized) (M62.81);Other symptoms and signs involving cognitive function;History of falling (Z91.81) Pain - Right/Left: Left Pain - part of body: Hip   Activity Tolerance Patient tolerated treatment well   Patient Left in chair;with call bell/phone within reach;with chair alarm set   Nurse Communication Mobility status        Time: 6962-9528 OT Time Calculation (min): 19 min  Charges: OT General Charges $OT Visit: 1 Visit OT Evaluation $OT Eval Moderate Complexity: 1 Mod  Nestor Lewandowsky, OTR/L Acute Rehabilitation Services Pager: [phone] Office: [phone]   Malka So 12/07/2018, 12:24 PM

## 2018-12-07 NOTE — Consult Note (Signed)
Reason for Consult: Previous total hip arthroplasty with fall and nondisplaced greater trochanteric fracture. Referring Physician: Roderic Palau MD  Gina Bowers is an 85 y.o. female.  HPI: 85 year old female with fall, previous total up arthroplasty and left hip pain inability to ambulate.  X-rays demonstrated a nondisplaced greater trochanteric fracture at the base of the greater trochanter.  She is a patient of Dr. Joni Bowers and has had significant history of coronary artery disease, previous CVA, diastolic heart failure previous CABG.  She uses CPAP at night and was unable to ambulate admitted for pain control and therapy.  January 2019 fall with left superior inferior rami fractures.  Hip arthroplasty is remained stable.  Patient's been in assisted living facility in the past.  No other areas of pain after fall.  Past Medical History:  Diagnosis Date  . Arthritis   . Blood transfusion 2007   AFTER HIP REPLACEMENT  . Carotid bruit    PT'S DAUGHTER STATES PT HAD RECENT CAROTID STUDY AND WAS TOLD NO SIGNIFICANT BLOCKAGES  . Chronic diastolic CHF (congestive heart failure) ([place])    a. 07/2015 Ech: Ef 60-65%, Gr1 DD.  Marland Kitchen Coronary artery disease    a. 2009 CABG 4, Dr. Servando Snare (LIMA to LAD , SVG to diagonal, SVG to circumflex, SVG to PDA); b. Cath 2012 in  Recent ill,[place] patent LIMA , Patent SVG to diagonal, occluded SVG to RCA. 5x23mm BMS->native RCA; c. 09/2015 Cath: LM nl, LAD 18m, D1 100, LCX small, OM1 90/small, OM2 70, RCA 45p, patent stent, VG->dRCA 100, VG->OM1 100, VG->Diag nl, LIMA->LAD nl->Med Rx.  . Depression   . Fracture FEB 2013   FRACTURED LEFT ANKLE--NO SURGERY--WAS IN BOOT UNTIL COUPLE WEEKS AGO  . GERD (gastroesophageal reflux disease)   . High cholesterol   . Hypothyroidism   . Ischemic colitis ([place]) 2010  . Kidney stones    IN THE PAST  . 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 [place] virus    COUPLE OF WEEKS AGO--ALL SYMTOMS RESOLVED PER  PT  . Obstructive sleep apnea 2010  . Shortness of breath    AND WHEEZING AT TIMES--NO INHALERS  . Stroke ([place])    SEVERAL STROKES -TOLD SHE HAS CEREBELLUM BLOCKAGE AS RESULT OF STROKE--BUT NEUROLOGIST SAID HE DID NOT WANT TO DO ANY PROCEDURE BECAUSE OF HER AGE .  PT HAS SLIGHT LEFT FOOT DROP-DRAGS FOOT WHEN WALKING - USES WALKER  . Urinary incontinence   . UTI (lower urinary tract infection)    HX OF UTI'S-- LAST TIME COUPLE OF MONTHS AGO  . Vertigo     Past Surgical History:  Procedure Laterality Date  . ABDOMINAL HYSTERECTOMY    . APPENDECTOMY    . [place] AND 1990  . CARDIAC CATHETERIZATION N/A 10/07/2015   Procedure: Left Heart Cath and Cors/Grafts Angiography;  Surgeon: Peter M Martinique, MD;  Location: [facility];  Service: Cardiovascular;  Laterality: N/A;  . CATARACT EXTRACTION    . CORONARY ANGIOPLASTY  10/2010   WITH STENT PLACEMENT  . CORONARY ARTERY BYPASS GRAFT  2009  . CYSTOSCOPY WITH INJECTION  09/21/2011   Procedure: CYSTOSCOPY WITH INJECTION;  Surgeon: Ailene Rud, MD;  Location: [facility];  Service: Urology;  Laterality: N/A;  Peri Urethral Macroplastique Injection and Estring Placement  . HERNIA REPAIR    . HIP SURGERY    . JOINT REPLACEMENT  2007   HIP REPLACEMENT -LEFT  .  TONSILLECTOMY      Family History  Problem Relation Age of Onset  . Heart disease Mother     Social History:  reports that she has never smoked. She has never used smokeless tobacco. She reports that she does not drink alcohol or use drugs.  Allergies:  Allergies  Allergen Reactions  . Meclizine Other (See Comments)    Caused cognitive impairment  . Nitrofurantoin Nausea And Vomiting and Other (See Comments)    Also caused hallucinations  . Sulfa Antibiotics Hives  . Sulfonamide Derivatives Hives    Medications: I have reviewed the patient's current medications.  Results for orders placed or performed during the hospital encounter of 12/06/18 (from the  past 48 hour(s))  Basic metabolic panel     Status: Abnormal   Collection Time: 12/06/18  6:24 PM  Result Value Ref Range   Sodium 137 135 - 145 mmol/L   Potassium 3.9 3.5 - 5.1 mmol/L   Chloride 100 98 - 111 mmol/L   CO2 27 22 - 32 mmol/L   Glucose, Bld 122 (H) 70 - 99 mg/dL   BUN 18 8 - 23 mg/dL   Creatinine, Ser 0.60 0.44 - 1.00 mg/dL   Calcium 9.1 8.9 - 10.3 mg/dL   GFR calc non Af Amer >60 >60 mL/min   GFR calc Af Amer >60 >60 mL/min   Anion gap 10 5 - 15    Comment: Performed at [facility], [place] [address] [place], [place] [address]  CBC with Differential     Status: Abnormal   Collection Time: 12/06/18  6:24 PM  Result Value Ref Range   WBC 13.3 (H) 4.0 - 10.5 K/uL   RBC 3.72 (L) 3.87 - 5.11 MIL/uL   Hemoglobin 11.7 (L) 12.0 - 15.0 g/dL   HCT 36.5 36.0 - 46.0 %   MCV 98.1 80.0 - 100.0 fL   MCH 31.5 26.0 - 34.0 pg   MCHC 32.1 30.0 - 36.0 g/dL   RDW 12.5 11.5 - 15.5 %   Platelets 217 150 - 400 K/uL   nRBC 0.0 0.0 - 0.2 %   Neutrophils Relative % 72 %   Neutro Abs 9.5 (H) 1.7 - 7.7 K/uL   Lymphocytes Relative 20 %   Lymphs Abs 2.7 0.7 - 4.0 K/uL   Monocytes Relative 6 %   Monocytes Absolute 0.8 0.1 - 1.0 K/uL   Eosinophils Relative 2 %   Eosinophils Absolute 0.2 0.0 - 0.5 K/uL   Basophils Relative 0 %   Basophils Absolute 0.0 0.0 - 0.1 K/uL   Immature Granulocytes 0 %   Abs Immature Granulocytes 0.05 0.00 - 0.07 K/uL    Comment: Performed at [facility] [address] [place], [place] [address]  Urinalysis, Routine w reflex microscopic     Status: None   Collection Time: 12/06/18  6:24 PM  Result Value Ref Range   Color, Urine YELLOW YELLOW   APPearance CLEAR CLEAR   Specific Gravity, Urine 1.018 1.005 - 1.030   pH 6.0 5.0 - 8.0   Glucose, UA NEGATIVE NEGATIVE mg/dL   Hgb urine dipstick NEGATIVE NEGATIVE   Bilirubin Urine NEGATIVE NEGATIVE   Ketones, ur NEGATIVE NEGATIVE mg/dL   Protein, ur NEGATIVE NEGATIVE mg/dL   Nitrite NEGATIVE NEGATIVE    Leukocytes,Ua NEGATIVE NEGATIVE    Comment: Performed at [facility] [address], [place], [place] [address]  SARS Coronavirus 2 (CEPHEID - Performed in [facility] hospital lab),  Hosp Order     Status: None   Collection Time: 12/06/18  6:44 PM   Specimen: Nasopharyngeal Swab  Result Value Ref Range   SARS Coronavirus 2 NEGATIVE NEGATIVE    Comment: (NOTE) If result is NEGATIVE SARS-CoV-2 target nucleic acids are NOT DETECTED. The SARS-CoV-2 RNA is generally detectable in upper and lower  respiratory specimens during the acute phase of infection. The lowest  concentration of SARS-CoV-2 viral copies this assay can detect is 250  copies / mL. A negative result does not preclude SARS-CoV-2 infection  and should not be used as the sole basis for treatment or other  patient management decisions.  A negative result may occur with  improper specimen collection / handling, submission of specimen other  than nasopharyngeal swab, presence of viral mutation(s) within the  areas targeted by this assay, and inadequate number of viral copies  (<250 copies / mL). A negative result must be combined with clinical  observations, patient history, and epidemiological information. If result is POSITIVE SARS-CoV-2 target nucleic acids are DETECTED. The SARS-CoV-2 RNA is generally detectable in upper and lower  respiratory specimens dur ing the acute phase of infection.  Positive  results are indicative of active infection with SARS-CoV-2.  Clinical  correlation with patient history and other diagnostic information is  necessary to determine patient infection status.  Positive results do  not rule out bacterial infection or co-infection with other viruses. If result is PRESUMPTIVE POSTIVE SARS-CoV-2 nucleic acids MAY BE PRESENT.   A presumptive positive result was obtained on the submitted specimen  and confirmed on repeat testing.  While 2019 novel coronavirus  (SARS-CoV-2) nucleic acids may  be present in the submitted sample  additional confirmatory testing may be necessary for epidemiological  and / or clinical management purposes  to differentiate between  SARS-CoV-2 and other Sarbecovirus currently known to infect humans.  If clinically indicated additional testing with an alternate test  methodology [phone]) is advised. The SARS-CoV-2 RNA is generally  detectable in upper and lower respiratory sp ecimens during the acute  phase of infection. The expected result is Negative. Fact Sheet for Patients:  StrictlyIdeas.no Fact Sheet for Healthcare Providers: BankingDealers.co.za This test is not yet approved or cleared by the [place] FDA and has been authorized for detection and/or diagnosis of SARS-CoV-2 by FDA under an Emergency Use Authorization (EUA).  This EUA will remain in effect (meaning this test can be used) for the duration of the COVID-19 declaration under Section 564(b)(1) of the Act, 21 U.S.C. section 360bbb-3(b)(1), unless the authorization is terminated or revoked sooner. Performed at [facility], [place] [address], [place], [place] [address]   TSH     Status: None   Collection Time: 12/06/18 11:18 PM  Result Value Ref Range   TSH 3.356 0.350 - 4.500 uIU/mL    Comment: Performed by a 3rd Generation assay with a functional sensitivity of <=0.01 uIU/mL. Performed at [facility], [place] [address] [place], [place] [address]   Basic metabolic panel     Status: Abnormal   Collection Time: 12/07/18  3:58 AM  Result Value Ref Range   Sodium 136 135 - 145 mmol/L   Potassium 3.5 3.5 - 5.1 mmol/L   Chloride 101 98 - 111 mmol/L   CO2 26 22 - 32 mmol/L   Glucose, Bld 116 (H) 70 - 99 mg/dL   BUN 17 8 - 23 mg/dL   Creatinine, Ser 0.51 0.44 - 1.00 mg/dL  Calcium 8.6 (L) 8.9 - 10.3 mg/dL   GFR calc non Af Amer >60 >60 mL/min   GFR calc Af Amer >60 >60 mL/min   Anion gap 9 5 - 15    Comment:  Performed at [facility] [address] [place], [place] [address]  CBC     Status: Abnormal   Collection Time: 12/07/18  3:58 AM  Result Value Ref Range   WBC 10.8 (H) 4.0 - 10.5 K/uL   RBC 3.50 (L) 3.87 - 5.11 MIL/uL   Hemoglobin 10.8 (L) 12.0 - 15.0 g/dL   HCT 33.1 (L) 36.0 - 46.0 %   MCV 94.6 80.0 - 100.0 fL   MCH 30.9 26.0 - 34.0 pg   MCHC 32.6 30.0 - 36.0 g/dL   RDW 12.5 11.5 - 15.5 %   Platelets 176 150 - 400 K/uL   nRBC 0.0 0.0 - 0.2 %    Comment: Performed at [facility], [place] [address], [place], [place] [address]    Ct Head Wo Contrast  Result Date: 12/06/2018 CLINICAL DATA:  Ct head wo, Pt brought in by ems from [facility] ; Pt fell approx 15 min prior to arrival; walking looked down bent over to pick up something and lost her balance and fell on left side ; on Plavix Head trauma, minor, GCS>=13, high clinical risk, initial exam EXAM: CT HEAD WITHOUT CONTRAST TECHNIQUE: Contiguous axial images were obtained from the base of the skull through the vertex without intravenous contrast. COMPARISON:  03/16/2017 FINDINGS: Brain: No acute intracranial hemorrhage. No focal mass lesion. No CT evidence of acute infarction. No midline shift or mass effect. No hydrocephalus. Basilar cisterns are patent. There are periventricular and subcortical white matter hypodensities. Generalized cortical atrophy. Vascular: No hyperdense vessel or unexpected calcification. Skull: Normal. Negative for fracture or focal lesion. Sinuses/Orbits: Paranasal sinuses and mastoid air cells are clear. Orbits are clear. Other: None. IMPRESSION: 1. No acute intracranial findings. 2. Atrophy and chronic white matter microvascular disease. Electronically Signed   By: Suzy Bouchard M.D.   On: 12/06/2018 19:16   Dg Chest Portable 1 View  Result Date: 12/06/2018 CLINICAL DATA:  Hip fracture preoperative radiograph. History of sleep apnea, lung nodules, coronary artery disease, CABG, diastolic CHF.  EXAM: PORTABLE CHEST 1 VIEW COMPARISON:  Chest radiograph 05/25/2018, chest CT 06/12/2016 FINDINGS: Low lung volumes with hypoventilatory changes in the lung bases. Lungs are otherwise clear. No visualized nodules or masses. Chronically coarsened interstitium. No pneumothorax. No effusion. Post CABG changes including intact and aligned median sternotomy wires and surgical material projecting over the cardiac silhouette. Cardiomediastinal contours are similar to prior studies. Cardiac monitoring leads overlie the chest. Linear radiodensity over the right shoulder likely reflects a mask wire. Degenerative changes in the imaged shoulders and spine. No acute osseous abnormality. IMPRESSION: 1. Low lung volumes with hypoventilatory changes in the lung bases. No acute cardiopulmonary disease. 2. No convincing evidence of pulmonary edema. Electronically Signed   By: Lovena Le M.D.   On: 12/06/2018 20:06   Dg Hip Unilat W Or Wo Pelvis 2-3 Views Left  Result Date: 12/06/2018 CLINICAL DATA:  Fall, hip pain EXAM: DG HIP (WITH OR WITHOUT PELVIS) 2-3V LEFT COMPARISON:  07/02/2017 FINDINGS: Prior left hip replacement. There is a fracture through the base of the greater trochanter. Minimal displacement. No subluxation or dislocation. IMPRESSION: Minimally displaced fracture at the base of the left greater trochanter. Prior left hip replacement. Electronically Signed   By: Lennette Bihari  [place] M.D.   On: 12/06/2018 18:18    ROS positive for previous left total hip arthroplasty, heart failure, previous CABG procedure.  Previous CVA.  CT scan without contrast negative for acute injury on admission.  Previous left pubic rami fractures.  History of shortness of breath, uses CPAP.  Urinary incontinence.  UTIs.  Otherwise negative is a pertains HPI. Blood pressure (!) 151/61, pulse 63, temperature 98 F (36.7 C), temperature source Oral, resp. rate 16, height 5\' 1"  (1.549 m), weight 76.4 kg, SpO2 95 %. Physical Exam   Constitutional: She is oriented to person, place, and time. She appears well-developed and well-nourished.  HENT:  Head: Normocephalic.  Eyes: Pupils are equal, round, and reactive to light.  Neck: Normal range of motion. Neck supple. No thyromegaly present.  Cardiovascular: Normal rate.  Respiratory: Effort normal.  GI: Soft. She exhibits no distension.  Neurological: She is alert and oriented to person, place, and time.  Skin: Skin is warm and dry.  Psychiatric: She has a normal mood and affect. Her behavior is normal.  No ecchymosis over the left hip.  This is a closed injury.  Pain with hip range of motion attempted straight leg raising.  Sciatic sensation the foot is intact.  No tenderness in the pelvis.  Assessment/Plan: Left greater trochanteric fracture nondisplaced comparison to 07/02/2017 images.  Plan will be slow mobilization with physical therapy once she is mobile she can proceed with placement.  She can follow-up with Dr. Durward Fortes in 2 weeks.  Outlined treatment plan discussed with patient we will continue to follow.  She will start off with 50% partial weightbearing.  Gina Bowers 12/07/2018, 8:11 AM

## 2018-12-07 NOTE — Telephone Encounter (Signed)
Please advise thanks.

## 2018-12-07 NOTE — Evaluation (Signed)
Physical Therapy Evaluation Patient Details Name: Gina Bowers MRN: [id number] DOB: [birth year] Today's Date: 12/07/2018   History of Present Illness  Pt is a 85 year old woman who resides at [facility]. She bent down to pick something up from the floor and fell resulting in L non displaced greater trochanteric hip fx with plan for conservative tx and 50% WB. PMH: dementia, CAD, CVA, CHF, CABG, sleep apnea, falls, depression.    Clinical Impression  Pt presents with an overall decrease in functional mobility secondary to above. PTA, pt resides at [facility], ambulatory with rollator with aide assist for ADLs/IADLs. Educ on precautions, positioning, therex, and importance of mobility. Today, pt able to initiate standing and gait training with RW and mod-maxA+2; limited by LLE pain. Pt would benefit from continued acute PT services to maximize functional mobility and independence prior to d/c with SNF-level therapies.     Follow Up Recommendations SNF;Supervision for mobility/OOB    Equipment Recommendations  Rolling walker with 5" wheels    Recommendations for Other Services       Precautions / Restrictions Precautions Precautions: Fall Restrictions Weight Bearing Restrictions: Yes LLE Weight Bearing: Partial weight bearing LLE Partial Weight Bearing Percentage or Pounds: 50%      Mobility  Bed Mobility Overal bed mobility: Needs Assistance Bed Mobility: Supine to Sit     Supine to sit: Max assist     General bed mobility comments: MaxA to assist LLE, scoot hips and elevate trunk; pt able to initiate movement well  Transfers Overall transfer level: Needs assistance Equipment used: Rolling walker (2 wheeled) Transfers: Sit to/from Stand Sit to Stand: Max assist;+2 physical assistance         General transfer comment: MaxA+2 for trunk elevation to RW, pt automatically performing correct hand placement; pt limited by  pain  Ambulation/Gait Ambulation/Gait assistance: Mod assist;+2 safety/equipment Gait Distance (Feet): 1 Feet Assistive device: Rolling walker (2 wheeled) Gait Pattern/deviations: Step-to pattern Gait velocity: Decreased   General Gait Details: Pt with difficulty shifting weight due to LLE pain, scooting bilateral feet for pivotal steps from bed to recliner; when taking steps backwards, able to lift LLE off floor. Cues for LLE 50% PWB  Stairs            Wheelchair Mobility    Modified Rankin (Stroke Patients Only)       Balance Overall balance assessment: Needs assistance   Sitting balance-Leahy Scale: Fair       Standing balance-Leahy Scale: Poor                               Pertinent Vitals/Pain Pain Assessment: Faces Faces Pain Scale: Hurts whole lot Pain Location: LLE Pain Descriptors / Indicators: Grimacing;Guarding Pain Intervention(s): Monitored during session;Repositioned    Home Living Family/patient expects to be discharged to:: Assisted living Living Arrangements: Alone Available Help at Discharge: Personal care attendant;Family;Available PRN/intermittently Type of Home: Independent living facility Home Access: Level entry;Elevator     Home Layout: One level Home Equipment: Walker - 4 wheels;Shower seat;Hand held shower head;Grab bars - tub/shower;Grab bars - toilet Additional Comments: [facility]    Prior Function Level of Independence: Needs assistance   Gait / Transfers Assistance Needed: Ambulatory with rollator  ADL's / Homemaking Assistance Needed: Caregiver assists with bathing and dressing; daughter assists with medication management. Meals provided        Hand Dominance  Extremity/Trunk Assessment   Upper Extremity Assessment Upper Extremity Assessment: Defer to OT evaluation    Lower Extremity Assessment Lower Extremity Assessment: LLE deficits/detail;Generalized weakness LLE Deficits /  Details: L hip and knee <3/5, limited by pain LLE: Unable to fully assess due to pain       Communication   Communication: HOH  Cognition Arousal/Alertness: Awake/alert Behavior During Therapy: WFL for tasks assessed/performed Overall Cognitive Status: No family/caregiver present to determine baseline cognitive functioning                                 General Comments: WFL for simple tasks; pleasant, interacting and following commands appropriately. Some slowed processing, likely baseline cognition      General Comments      Exercises     Assessment/Plan    PT Assessment Patient needs continued PT services  PT Problem List Decreased strength;Decreased range of motion;Decreased activity tolerance;Decreased balance;Decreased mobility;Decreased knowledge of use of DME;Decreased knowledge of precautions;Pain       PT Treatment Interventions DME instruction;Gait training;Functional mobility training;Therapeutic activities;Therapeutic exercise;Balance training;Patient/family education    PT Goals (Current goals can be found in the Care Plan section)  Acute Rehab PT Goals Patient Stated Goal: Agreeable to rehab at SNF before return to ILF PT Goal Formulation: With patient Time For Goal Achievement: 12/21/18 Potential to Achieve Goals: Good    Frequency Min 3X/week   Barriers to discharge        Co-evaluation PT/OT/SLP Co-Evaluation/Treatment: Yes Reason for Co-Treatment: For patient/therapist safety;To address functional/ADL transfers PT goals addressed during session: Mobility/safety with mobility;Balance;Proper use of DME         AM-PAC PT "6 Clicks" Mobility  Outcome Measure Help needed turning from your back to your side while in a flat bed without using bedrails?: A Lot Help needed moving from lying on your back to sitting on the side of a flat bed without using bedrails?: A Lot Help needed moving to and from a bed to a chair (including a  wheelchair)?: A Lot Help needed standing up from a chair using your arms (e.g., wheelchair or bedside chair)?: A Lot Help needed to walk in hospital room?: A Lot Help needed climbing 3-5 steps with a railing? : Total 6 Click Score: 11    End of Session Equipment Utilized During Treatment: Gait belt Activity Tolerance: Patient tolerated treatment well;Patient limited by pain Patient left: in chair;with call bell/phone within reach;with chair alarm set Nurse Communication: Mobility status PT Visit Diagnosis: Other abnormalities of gait and mobility (R26.89);Pain;Muscle weakness (generalized) (M62.81) Pain - Right/Left: Left Pain - part of body: Leg;Hip    Time: 4008-6761 PT Time Calculation (min) (ACUTE ONLY): 26 min   Charges:   PT Evaluation $PT Eval Moderate Complexity: 1 Mod     Mabeline Caras, PT, DPT Acute Rehabilitation Services  Pager [phone] Office [place] 12/07/2018, 11:38 AM

## 2018-12-07 NOTE — Telephone Encounter (Signed)
I called and discussed plan.

## 2018-12-08 LAB — URINE CULTURE: Culture: >=100000 — AB

## 2018-12-08 NOTE — TOC Initial Note (Signed)
Transition of Care [facility]) - Initial/Assessment Note    Patient Details  Name: Gina Bowers MRN: [id number] Date of Birth: 1925-06-29  Transition of Care [facility]) CM/SW Contact:    Alberteen Sam, [place] Phone Number: [phone] 12/08/2018, 4:28 PM  Clinical Narrative:                  CSW initially called into patient's room for assessment and discharge planning however patient appeared confused and asked CSW to call [name] her daughter. CSW called [name] who reports patient is confused often and is hard of hearing. She states that family does not agree with rec of SNF for patient at discharge, they have planned for patient to return to [place] with 24/7 caregivers and PT/OT through [facility]. [name] reports she has also been in communication with [employer] about this plan.   CSW will follow up with [facility] to ensure home health services are in place for when patient returns.   Expected Discharge Plan: [place] Barriers to Discharge: Continued Medical Work up   Patient Goals and CMS Choice   CMS Medicare.gov Compare Post Acute Care list provided to:: Patient Represenative (must comment)(Carolyn (daughter)) Choice offered to / list presented to : Adult Children(Carolyn)  Expected Discharge Plan and Services Expected Discharge Plan: [place] Choice: [place] arrangements for the past 2 months: Independent Living Facility([facility])                                      Prior Living Arrangements/Services Living arrangements for the past 2 months: Independent Living Facility([facility]) Lives with:: Self Patient language and need for interpreter reviewed:: Yes Do you feel safe going back to the place where you live?: Yes      Need for Family Participation in Patient Care: Yes (Comment) Care giver support system in place?: Yes (comment) Current home services: Home RN,  Homehealth aide Criminal Activity/Legal Involvement Pertinent to Current Situation/Hospitalization: No - Comment as needed  Activities of Daily Living Home Assistive Devices/Equipment: Environmental consultant (specify type) ADL Screening (condition at time of admission) Patient's cognitive ability adequate to safely complete daily activities?: Yes Is the patient deaf or have difficulty hearing?: No Does the patient have difficulty seeing, even when wearing glasses/contacts?: No Does the patient have difficulty concentrating, remembering, or making decisions?: No Patient able to express need for assistance with ADLs?: Yes Does the patient have difficulty dressing or bathing?: No Independently performs ADLs?: No Communication: Needs assistance Does the patient have difficulty walking or climbing stairs?: Yes Weakness of Legs: Left Weakness of Arms/Hands: None  Permission Sought/Granted Permission sought to share information with : Case Manager, Customer service manager, Family Supports Permission granted to share information with : Yes, Verbal Permission Granted  Share Information with NAME: [name]  Permission granted to share info w AGENCY: ILF/Home Health  Permission granted to share info w Relationship: daughter  Permission granted to share info w Contact Information: [phone]  Emotional Assessment Appearance:: Appears stated age Attitude/Demeanor/Rapport: Gracious Affect (typically observed): Calm Orientation: : Oriented to Self, Oriented to Place Alcohol / Substance Use: Not Applicable Psych Involvement: No (comment)  Admission diagnosis:  Closed fracture of left hip, initial encounter [facility]) [S72.002A] Patient Active Problem List   Diagnosis Date Noted  . Closed fracture of left hip ([place])   . Closed  displaced fracture of greater trochanter of left femur ([place]) 12/06/2018  . Pubic ramus fracture ([place]) 06/08/2017  . Acetabulum fracture ([place]) 06/08/2017  . Fall at home, initial  encounter 06/08/2017  . UTI (urinary tract infection) 06/10/2016  . Acute pyelonephritis   . SIRS (systemic inflammatory response syndrome) (HCC)   . Epistaxis 03/09/2016  . Ocular migraine 11/04/2015  . Essential hypertension 11/04/2015  . HLD (hyperlipidemia) 11/04/2015  . Coronary artery disease involving coronary bypass graft of native heart 11/04/2015  . Fatigue 10/08/2015  . Urinary tract infection 10/05/2015  . Chest pain 10/04/2015  . Hypertension 10/04/2015  . Unstable angina ([place])   . Hypertensive heart disease without heart failure   . S/P CABG 2009-cath 10/07/15-medical Rx   . S/P coronary artery stent placement-2012   . Acute CVA (cerebrovascular accident) ([place]) 08/17/2015  . Possible Renovascular hypertension 07/25/2015  . Hyperlipidemia 07/25/2015  . History of stroke-March 2017 (rt brain) 07/25/2015  . Vertebrobasilar artery stenosis 07/25/2015  . CAD (coronary artery disease) of artery bypass graft 06/06/2014  . Diastolic dysfunction-grade 1 with EF 60% 06/06/2014  . CVA (cerebral vascular accident) ([place]) 11/17/2011  . TIA post cath 10/07/15 11/13/2011  . Weakness 11/13/2011  . Obstructive sleep apnea 11/23/2008  . PULMONARY NODULE 11/23/2008  . Hypoxemia 11/23/2008  . Hypothyroidism 11/22/2008  . Anxiety state 11/22/2008  . Cerebral artery occlusion with cerebral infarction ([place]) 11/22/2008   PCP:  Lavone Orn, MD Pharmacy:   [facility] [address], [place] - [place] [address] Phone: [phone] Fax: [phone]     Social Determinants of Health (SDOH) Interventions    Readmission Risk Interventions No flowsheet data found.

## 2018-12-08 NOTE — Progress Notes (Signed)
PROGRESS NOTE    Gina Bowers  DGL:[id number] DOB: 1926/04/12 DOA: 12/06/2018 PCP: Lavone Orn, MD    Brief Narrative:  85 year old female with a history of coronary artery disease, diastolic heart failure, hypertension, hyperlipidemia, patient suffered a fall while at home she was found to have left-sided hip fracture.  She was seen by orthopedics and it was felt that nonoperative management would be most appropriate.   Assessment & Plan:   Principal Problem:   Closed displaced fracture of greater trochanter of left femur (HCC) Active Problems:   Hypothyroidism   Obstructive sleep apnea   CAD (coronary artery disease) of artery bypass graft   Diastolic dysfunction-grade 1 with EF 60%   Hyperlipidemia   History of stroke-March 2017 (rt brain)   Closed fracture of left hip ([place])   1. Non displaced fracture of let greater trochanteric fracture.  Seen by orthopedics.  Plans are for nonoperative management.  Continue pain management and physical therapy.  Discussed with patient's daughter who wishes to take the patient home.  She says that she can provide 24-hour care for the patient.  She would rather the patient be at home and have home health PT.  We will plan on possible transfer home once the patient is able to somewhat ambulate.  Await further input from orthopedics. 2. Chronic diastolic congestive heart failure.  Appears euvolemic.  She is not on chronic diuretics. 3. Coronary artery disease.  No complaints of chest pain.  Continue aspirin, Plavix, Imdur, statin, beta-blocker 4. Hypertension.  Stable on losartan, beta-blocker and Imdur. 5. Hyperlipidemia.  Continue statin. 6. Hypothyroidism.  Continue on Synthroid.  TSH normal.   DVT prophylaxis: lovenox Code Status: DNR Family Communication: discussed with daughter Disposition Plan: return home with home health services   Consultants:   Orthopedics, Dr Lorin Mercy  Procedures:     Antimicrobials:        Subjective: Sitting up in chair. Continues to have pain in left hip  Objective: Vitals:   12/07/18 1424 12/07/18 1946 12/08/18 0349 12/08/18 0905  BP: (!) 150/66 (!) 141/64 (!) 164/72 (!) 161/66  Pulse: 65 64 63 61  Resp: 16 18 16 18   Temp: 99.1 F (37.3 C) 99.6 F (37.6 C) 97.9 F (36.6 C) 97.7 F (36.5 C)  TempSrc: Oral Oral Oral Oral  SpO2: 98% 93% 94% 96%  Weight:      Height:        Intake/Output Summary (Last 24 hours) at 12/08/2018 1930 Last data filed at 12/08/2018 1400 Gross per 24 hour  Intake 840 ml  Output 150 ml  Net 690 ml   Filed Weights   12/07/18 0600  Weight: 76.4 kg    Examination:  General exam: Alert, awake, oriented x 3 Respiratory system: Clear to auscultation. Respiratory effort normal. Cardiovascular system:RRR. No murmurs, rubs, gallops. Gastrointestinal system: Abdomen is nondistended, soft and nontender. No organomegaly or masses felt. Normal bowel sounds heard. Central nervous system: Alert and oriented. No focal neurological deficits. Extremities: No C/C/E, +pedal pulses Skin: No rashes, lesions or ulcers Psychiatry: Judgement and insight appear normal. Mood & affect appropriate.      Data Reviewed: I have personally reviewed following labs and imaging studies  CBC: Recent Labs  Lab 12/06/18 1824 12/07/18 0358  WBC 13.3* 10.8*  NEUTROABS 9.5*  --   HGB 11.7* 10.8*  HCT 36.5 33.1*  MCV 98.1 94.6  PLT 217 518   Basic Metabolic Panel: Recent Labs  Lab 12/06/18 1824 12/07/18 0358  NA  137 136  K 3.9 3.5  CL 100 101  CO2 27 26  GLUCOSE 122* 116*  BUN 18 17  CREATININE 0.60 0.51  CALCIUM 9.1 8.6*   GFR: Estimated Creatinine Clearance: 42.8 mL/min (by C-G formula based on SCr of 0.51 mg/dL). Liver Function Tests: No results for input(s): AST, ALT, ALKPHOS, BILITOT, PROT, ALBUMIN in the last 168 hours. No results for input(s): LIPASE, AMYLASE in the last 168 hours. No results for input(s): AMMONIA in the last 168  hours. Coagulation Profile: No results for input(s): INR, PROTIME in the last 168 hours. Cardiac Enzymes: No results for input(s): CKTOTAL, CKMB, CKMBINDEX, TROPONINI in the last 168 hours. BNP (last 3 results) No results for input(s): PROBNP in the last 8760 hours. HbA1C: No results for input(s): HGBA1C in the last 72 hours. CBG: No results for input(s): GLUCAP in the last 168 hours. Lipid Profile: No results for input(s): CHOL, HDL, LDLCALC, TRIG, CHOLHDL, LDLDIRECT in the last 72 hours. Thyroid Function Tests: Recent Labs    12/06/18 2318  TSH 3.356   Anemia Panel: No results for input(s): VITAMINB12, FOLATE, FERRITIN, TIBC, IRON, RETICCTPCT in the last 72 hours. Sepsis Labs: No results for input(s): PROCALCITON, LATICACIDVEN in the last 168 hours.  Recent Results (from the past 240 hour(s))  Urine culture     Status: Abnormal   Collection Time: 12/06/18  6:28 PM   Specimen: Urine, Clean Catch  Result Value Ref Range Status   Specimen Description URINE, CLEAN CATCH  Final   Special Requests   Final    NONE Performed at [facility], [address], [place], [place] [address]    Culture >=100,000 COLONIES/mL ENTEROCOCCUS FAECALIS (A)  Final   Report Status 12/08/2018 FINAL  Final   Organism ID, Bacteria ENTEROCOCCUS FAECALIS (A)  Final      Susceptibility   Enterococcus faecalis - MIC*    AMPICILLIN <=2 SENSITIVE Sensitive     LEVOFLOXACIN >=8 RESISTANT Resistant     NITROFURANTOIN <=16 SENSITIVE Sensitive     VANCOMYCIN 1 SENSITIVE Sensitive     * >=100,000 COLONIES/mL ENTEROCOCCUS FAECALIS  SARS Coronavirus 2 (CEPHEID - Performed in [place] hospital lab), Hosp Order     Status: None   Collection Time: 12/06/18  6:44 PM   Specimen: Nasopharyngeal Swab  Result Value Ref Range Status   SARS Coronavirus 2 NEGATIVE NEGATIVE Final    Comment: (NOTE) If result is NEGATIVE SARS-CoV-2 target nucleic acids are NOT DETECTED. The SARS-CoV-2 RNA is generally  detectable in upper and lower  respiratory specimens during the acute phase of infection. The lowest  concentration of SARS-CoV-2 viral copies this assay can detect is 250  copies / mL. A negative result does not preclude SARS-CoV-2 infection  and should not be used as the sole basis for treatment or other  patient management decisions.  A negative result may occur with  improper specimen collection / handling, submission of specimen other  than nasopharyngeal swab, presence of viral mutation(s) within the  areas targeted by this assay, and inadequate number of viral copies  (<250 copies / mL). A negative result must be combined with clinical  observations, patient history, and epidemiological information. If result is POSITIVE SARS-CoV-2 target nucleic acids are DETECTED. The SARS-CoV-2 RNA is generally detectable in upper and lower  respiratory specimens dur ing the acute phase of infection.  Positive  results are indicative of active infection with SARS-CoV-2.  Clinical  correlation with patient history and other  diagnostic information is  necessary to determine patient infection status.  Positive results do  not rule out bacterial infection or co-infection with other viruses. If result is PRESUMPTIVE POSTIVE SARS-CoV-2 nucleic acids MAY BE PRESENT.   A presumptive positive result was obtained on the submitted specimen  and confirmed on repeat testing.  While 2019 novel coronavirus  (SARS-CoV-2) nucleic acids may be present in the submitted sample  additional confirmatory testing may be necessary for epidemiological  and / or clinical management purposes  to differentiate between  SARS-CoV-2 and other Sarbecovirus currently known to infect humans.  If clinically indicated additional testing with an alternate test  methodology [phone]) is advised. The SARS-CoV-2 RNA is generally  detectable in upper and lower respiratory sp ecimens during the acute  phase of infection. The  expected result is Negative. Fact Sheet for Patients:  StrictlyIdeas.no Fact Sheet for Healthcare Providers: BankingDealers.co.za This test is not yet approved or cleared by the [place] FDA and has been authorized for detection and/or diagnosis of SARS-CoV-2 by FDA under an Emergency Use Authorization (EUA).  This EUA will remain in effect (meaning this test can be used) for the duration of the COVID-19 declaration under Section 564(b)(1) of the Act, 21 U.S.C. section 360bbb-3(b)(1), unless the authorization is terminated or revoked sooner. Performed at [facility], [place] [address], [place], [place] [address]          Radiology Studies: Dg Chest Portable 1 View  Result Date: 12/06/2018 CLINICAL DATA:  Hip fracture preoperative radiograph. History of sleep apnea, lung nodules, coronary artery disease, CABG, diastolic CHF. EXAM: PORTABLE CHEST 1 VIEW COMPARISON:  Chest radiograph 05/25/2018, chest CT 06/12/2016 FINDINGS: Low lung volumes with hypoventilatory changes in the lung bases. Lungs are otherwise clear. No visualized nodules or masses. Chronically coarsened interstitium. No pneumothorax. No effusion. Post CABG changes including intact and aligned median sternotomy wires and surgical material projecting over the cardiac silhouette. Cardiomediastinal contours are similar to prior studies. Cardiac monitoring leads overlie the chest. Linear radiodensity over the right shoulder likely reflects a mask wire. Degenerative changes in the imaged shoulders and spine. No acute osseous abnormality. IMPRESSION: 1. Low lung volumes with hypoventilatory changes in the lung bases. No acute cardiopulmonary disease. 2. No convincing evidence of pulmonary edema. Electronically Signed   By: Lovena Le M.D.   On: 12/06/2018 20:06        Scheduled Meds: . aspirin EC  81 mg Oral QPM  . clopidogrel  75 mg Oral Daily  . enoxaparin (LOVENOX)  injection  40 mg Subcutaneous Q24H  . isosorbide mononitrate  15 mg Oral Daily  . levothyroxine  88 mcg Oral Q0600  . losartan  25 mg Oral BID  . metoprolol succinate  25 mg Oral Daily  . pantoprazole  40 mg Oral Daily  . rosuvastatin  20 mg Oral QPM  . sertraline  50 mg Oral QPM   Continuous Infusions:   LOS: 2 days    Time spent: 22mins    Kathie Dike, MD [facility]   If 7PM-7AM, please contact night-coverage [email or web address]  12/08/2018, 7:30 PM

## 2018-12-09 DIAGNOSIS — G4733 Obstructive sleep apnea (adult) (pediatric): Secondary | ICD-10-CM

## 2018-12-09 DIAGNOSIS — I25810 Atherosclerosis of coronary artery bypass graft(s) without angina pectoris: Secondary | ICD-10-CM

## 2018-12-09 MED ORDER — OXYCODONE HCL 5 MG PO TABS: 5.0000 mg | ORAL_TABLET | ORAL | 0 refills | Status: DC | PRN

## 2018-12-09 NOTE — Discharge Summary (Signed)
Physician Discharge Summary  Gina Bowers IRJ:[id number] DOB: 02-11-1926 DOA: 12/06/2018  PCP: Lavone Orn, MD  Admit date: 12/06/2018 Discharge date: 12/09/2018  Admitted From: Home Disposition: Home  Recommendations for Outpatient Follow-up:  1. Follow up with PCP in 1-2 weeks 2. Please obtain BMP/CBC in one week 3. Follow-up with Dr. Durward Fortes in 2 weeks 4. 50% partial weightbearing on left leg  Home Health: Home health PT, OT Equipment/Devices:  Discharge Condition: Stable CODE STATUS: DNR Diet recommendation: Heart healthy  Brief/Interim Summary: 85 year old female with a history of coronary artery disease, diastolic heart failure, hypertension, hyperlipidemia, suffered a fall while she was at home and sustained a left-sided hip fracture.  Patient was seen by orthopedics and considering that she had a nondisplaced greater trochanteric fracture, nonoperative management was recommended.  Patient and family were in agreement.  She was provided pain management and physical therapy.  She will be 50% partial weightbearing for now.  Orthopedics recommends continuing aspirin for DVT prophylaxis.  She will follow-up with orthopedics in the next 2 weeks.  Initially skilled nursing facility placement was recommended, patient's daughter reports that arrangements have been made for her to have 24-hour care at home.  She would rather the patient return to her independent living with 24-hour care.  Home health services have been arranged.  The patient is able to transfer from bed to chair with 1 person assist.  She is felt stable for discharge at this point.  Discharge Diagnoses:  Principal Problem:   Closed displaced fracture of greater trochanter of left femur ([place]) Active Problems:   Hypothyroidism   Obstructive sleep apnea   CAD (coronary artery disease) of artery bypass graft   Diastolic dysfunction-grade 1 with EF 60%   Hyperlipidemia   History of stroke-March 2017 (rt brain)   Closed  fracture of left hip [facility])    Discharge Instructions  Discharge Instructions    Diet - low sodium heart healthy   Complete by: As directed    Increase activity slowly   Complete by: As directed      Allergies as of 12/09/2018      Reactions   Meclizine Other (See Comments)   Caused cognitive impairment   Nitrofurantoin Nausea And Vomiting, Other (See Comments)   Also caused hallucinations   Sulfa Antibiotics Hives   Sulfonamide Derivatives Hives      Medication List    STOP taking these medications   cephALEXin 250 MG capsule Commonly known as: KEFLEX   solifenacin 5 MG tablet Commonly known as: VESIcare     TAKE these medications   acetaminophen 500 MG tablet Commonly known as: TYLENOL Take 1,000 mg by mouth every 6 (six) hours as needed for mild pain or fever.   alendronate 70 MG tablet Commonly known as: FOSAMAX Take 1 tablet (70 mg total) by mouth once a week. Take with a full glass of water on an empty stomach.mon What changed:   when to take this  additional instructions   aspirin EC 81 MG tablet Take 81 mg by mouth every evening.   clopidogrel 75 MG tablet Commonly known as: PLAVIX Take 1 tablet (75 mg total) by mouth daily.   Cranberry 500 MG Caps Take 500 mg by mouth 2 (two) times a day.   D-Mannose 500 MG Caps Take 500 mg by mouth 2 (two) times a day.   docusate sodium 100 MG capsule Commonly known as: COLACE Take 100 mg by mouth every evening.   fish oil-omega-3 fatty acids  1000 MG capsule Take 1 g by mouth every evening.   IMMUPLEX LOZENGE MT Take 1 lozenge by mouth 2 (two) times a day. Morning and Evening   ipratropium-albuterol 0.5-2.5 (3) MG/3ML Soln Commonly known as: DUONEB Take 3 mLs by nebulization every 4 (four) hours as needed. What changed: reasons to take this   isosorbide mononitrate 30 MG 24 hr tablet Commonly known as: IMDUR TAKE 1/2 TABLET BY MOUTH DAILY What changed: when to take this   levothyroxine 88 MCG  tablet Commonly known as: SYNTHROID Take 1 tablet (88 mcg total) by mouth daily before breakfast.   losartan 25 MG tablet Commonly known as: COZAAR TAKE ONE TABLET BY MOUTH TWICE A DAY What changed:   how much to take  when to take this   Mega Probiotic Caps Take 1 capsule by mouth daily with breakfast. Mega Food/MegaFloraWomen's Probiotic   metoprolol succinate 25 MG 24 hr tablet Commonly known as: TOPROL-XL TAKE ONE TABLET BY MOUTH DAILY   nitroGLYCERIN 0.4 MG SL tablet Commonly known as: NITROSTAT Place 1 tablet (0.4 mg total) under the tongue every 5 (five) minutes as needed for chest pain.   NON FORMULARY Take 1 tablet by mouth See admin instructions. Bone Strength tablets (D-3, Calcium, K-1, K-2, Vanadium, Silicon, Strontium, Magnesium) Take 2 tablets by mouth in the evening   ondansetron 4 MG disintegrating tablet Commonly known as: Zofran ODT Take 1 tablet (4 mg total) by mouth every 6 (six) hours as needed for nausea or vomiting.   One-A-Day Proactive 65+ Tabs Take 1 tablet by mouth daily with breakfast.   oxyCODONE 5 MG immediate release tablet Commonly known as: Oxy IR/ROXICODONE Take 1 tablet (5 mg total) by mouth every 4 (four) hours as needed for moderate pain.   pantoprazole 40 MG tablet Commonly known as: PROTONIX TAKE ONE TABLET BY MOUTH DAILY MUST KEEP APPOINTMENT What changed: See the new instructions.   ProAir RespiClick 601 (90 Base) MCG/ACT Aepb Generic drug: Albuterol Sulfate Inhale 2 puffs into the lungs daily as needed (for shortness of breath).   rosuvastatin 20 MG tablet Commonly known as: CRESTOR TAKE ONE TABLET BY MOUTH DAILY What changed: when to take this   senna-docusate 8.6-50 MG tablet Commonly known as: Senokot-S Take 1 tablet by mouth 2 (two) times daily. What changed:   when to take this  reasons to take this   sertraline 50 MG tablet Commonly known as: ZOLOFT Take 1 tablet (50 mg total) by mouth daily. What changed:  when to take this   Systane 0.4-0.3 % Gel ophthalmic gel Generic drug: Polyethyl Glycol-Propyl Glycol Place 1 application into both eyes 2 (two) times a day. Morning and Bedtime      Follow-up Information    Garald Balding, MD. Schedule an appointment as soon as possible for a visit in 2 week(s).   Specialty: Orthopedic Surgery Contact information: [address] [place] [address] [phone]          Allergies  Allergen Reactions  . Meclizine Other (See Comments)    Caused cognitive impairment  . Nitrofurantoin Nausea And Vomiting and Other (See Comments)    Also caused hallucinations  . Sulfa Antibiotics Hives  . Sulfonamide Derivatives Hives    Consultations:  Orthopedics, Dr. Lorin Mercy   Procedures/Studies: Ct Head Wo Contrast  Result Date: 12/06/2018 CLINICAL DATA:  Ct head wo, Pt brought in by ems from [facility] ; Pt fell approx 15 min prior to arrival; walking looked down bent  over to pick up something and lost her balance and fell on left side ; on Plavix Head trauma, minor, GCS>=13, high clinical risk, initial exam EXAM: CT HEAD WITHOUT CONTRAST TECHNIQUE: Contiguous axial images were obtained from the base of the skull through the vertex without intravenous contrast. COMPARISON:  03/16/2017 FINDINGS: Brain: No acute intracranial hemorrhage. No focal mass lesion. No CT evidence of acute infarction. No midline shift or mass effect. No hydrocephalus. Basilar cisterns are patent. There are periventricular and subcortical white matter hypodensities. Generalized cortical atrophy. Vascular: No hyperdense vessel or unexpected calcification. Skull: Normal. Negative for fracture or focal lesion. Sinuses/Orbits: Paranasal sinuses and mastoid air cells are clear. Orbits are clear. Other: None. IMPRESSION: 1. No acute intracranial findings. 2. Atrophy and chronic white matter microvascular disease. Electronically Signed   By: Suzy Bouchard M.D.   On: 12/06/2018  19:16   Dg Chest Portable 1 View  Result Date: 12/06/2018 CLINICAL DATA:  Hip fracture preoperative radiograph. History of sleep apnea, lung nodules, coronary artery disease, CABG, diastolic CHF. EXAM: PORTABLE CHEST 1 VIEW COMPARISON:  Chest radiograph 05/25/2018, chest CT 06/12/2016 FINDINGS: Low lung volumes with hypoventilatory changes in the lung bases. Lungs are otherwise clear. No visualized nodules or masses. Chronically coarsened interstitium. No pneumothorax. No effusion. Post CABG changes including intact and aligned median sternotomy wires and surgical material projecting over the cardiac silhouette. Cardiomediastinal contours are similar to prior studies. Cardiac monitoring leads overlie the chest. Linear radiodensity over the right shoulder likely reflects a mask wire. Degenerative changes in the imaged shoulders and spine. No acute osseous abnormality. IMPRESSION: 1. Low lung volumes with hypoventilatory changes in the lung bases. No acute cardiopulmonary disease. 2. No convincing evidence of pulmonary edema. Electronically Signed   By: Lovena Le M.D.   On: 12/06/2018 20:06   Dg Hip Unilat W Or Wo Pelvis 2-3 Views Left  Result Date: 12/06/2018 CLINICAL DATA:  Fall, hip pain EXAM: DG HIP (WITH OR WITHOUT PELVIS) 2-3V LEFT COMPARISON:  07/02/2017 FINDINGS: Prior left hip replacement. There is a fracture through the base of the greater trochanter. Minimal displacement. No subluxation or dislocation. IMPRESSION: Minimally displaced fracture at the base of the left greater trochanter. Prior left hip replacement. Electronically Signed   By: Rolm Baptise M.D.   On: 12/06/2018 18:18       Subjective: Feels that pain is reasonably controlled.  Wants to go home.  Discharge Exam: Vitals:   12/08/18 0905 12/08/18 1623 12/08/18 1959 12/09/18 0317  BP: (!) 161/66 103/70 (!) 145/65 140/80  Pulse: 61 64 67 (!) 59  Resp: 18 18 16 16   Temp: 97.7 F (36.5 C) 98.1 F (36.7 C) 98.7 F (37.1 C)  98.2 F (36.8 C)  TempSrc: Oral Oral Oral Oral  SpO2: 96% 93% 91% 93%  Weight:      Height:        General: Pt is alert, awake, not in acute distress Cardiovascular: RRR, S1/S2 +, no rubs, no gallops Respiratory: CTA bilaterally, no wheezing, no rhonchi Abdominal: Soft, NT, ND, bowel sounds + Extremities: no edema, no cyanosis    The results of significant diagnostics from this hospitalization (including imaging, microbiology, ancillary and laboratory) are listed below for reference.     Microbiology: Recent Results (from the past 240 hour(s))  Urine culture     Status: Abnormal   Collection Time: 12/06/18  6:28 PM   Specimen: Urine, Clean Catch  Result Value Ref Range Status   Specimen Description URINE, CLEAN  CATCH  Final   Special Requests   Final    NONE Performed at [facility], [place] [address], [place], [place] [address]    Culture >=100,000 COLONIES/mL ENTEROCOCCUS FAECALIS (A)  Final   Report Status 12/08/2018 FINAL  Final   Organism ID, Bacteria ENTEROCOCCUS FAECALIS (A)  Final      Susceptibility   Enterococcus faecalis - MIC*    AMPICILLIN <=2 SENSITIVE Sensitive     LEVOFLOXACIN >=8 RESISTANT Resistant     NITROFURANTOIN <=16 SENSITIVE Sensitive     VANCOMYCIN 1 SENSITIVE Sensitive     * >=100,000 COLONIES/mL ENTEROCOCCUS FAECALIS  SARS Coronavirus 2 (CEPHEID - Performed in [facility] hospital lab), Hosp Order     Status: None   Collection Time: 12/06/18  6:44 PM   Specimen: Nasopharyngeal Swab  Result Value Ref Range Status   SARS Coronavirus 2 NEGATIVE NEGATIVE Final    Comment: (NOTE) If result is NEGATIVE SARS-CoV-2 target nucleic acids are NOT DETECTED. The SARS-CoV-2 RNA is generally detectable in upper and lower  respiratory specimens during the acute phase of infection. The lowest  concentration of SARS-CoV-2 viral copies this assay can detect is 250  copies / mL. A negative result does not preclude SARS-CoV-2 infection  and should not be  used as the sole basis for treatment or other  patient management decisions.  A negative result may occur with  improper specimen collection / handling, submission of specimen other  than nasopharyngeal swab, presence of viral mutation(s) within the  areas targeted by this assay, and inadequate number of viral copies  (<250 copies / mL). A negative result must be combined with clinical  observations, patient history, and epidemiological information. If result is POSITIVE SARS-CoV-2 target nucleic acids are DETECTED. The SARS-CoV-2 RNA is generally detectable in upper and lower  respiratory specimens dur ing the acute phase of infection.  Positive  results are indicative of active infection with SARS-CoV-2.  Clinical  correlation with patient history and other diagnostic information is  necessary to determine patient infection status.  Positive results do  not rule out bacterial infection or co-infection with other viruses. If result is PRESUMPTIVE POSTIVE SARS-CoV-2 nucleic acids MAY BE PRESENT.   A presumptive positive result was obtained on the submitted specimen  and confirmed on repeat testing.  While 2019 novel coronavirus  (SARS-CoV-2) nucleic acids may be present in the submitted sample  additional confirmatory testing may be necessary for epidemiological  and / or clinical management purposes  to differentiate between  SARS-CoV-2 and other Sarbecovirus currently known to infect humans.  If clinically indicated additional testing with an alternate test  methodology [phone]) is advised. The SARS-CoV-2 RNA is generally  detectable in upper and lower respiratory sp ecimens during the acute  phase of infection. The expected result is Negative. Fact Sheet for Patients:  StrictlyIdeas.no Fact Sheet for Healthcare Providers: BankingDealers.co.za This test is not yet approved or cleared by the [place] FDA and has been authorized  for detection and/or diagnosis of SARS-CoV-2 by FDA under an Emergency Use Authorization (EUA).  This EUA will remain in effect (meaning this test can be used) for the duration of the COVID-19 declaration under Section 564(b)(1) of the Act, 21 U.S.C. section 360bbb-3(b)(1), unless the authorization is terminated or revoked sooner. Performed at [facility], [place] [address], [place], [place] [address]      Labs: BNP (last 3 results) No results for input(s): BNP in the last 8760 hours.  Basic Metabolic Panel: Recent Labs  Lab 12/06/18 1824 12/07/18 0358  NA 137 136  K 3.9 3.5  CL 100 101  CO2 27 26  GLUCOSE 122* 116*  BUN 18 17  CREATININE 0.60 0.51  CALCIUM 9.1 8.6*   Liver Function Tests: No results for input(s): AST, ALT, ALKPHOS, BILITOT, PROT, ALBUMIN in the last 168 hours. No results for input(s): LIPASE, AMYLASE in the last 168 hours. No results for input(s): AMMONIA in the last 168 hours. CBC: Recent Labs  Lab 12/06/18 1824 12/07/18 0358  WBC 13.3* 10.8*  NEUTROABS 9.5*  --   HGB 11.7* 10.8*  HCT 36.5 33.1*  MCV 98.1 94.6  PLT 217 176   Cardiac Enzymes: No results for input(s): CKTOTAL, CKMB, CKMBINDEX, TROPONINI in the last 168 hours. BNP: Invalid input(s): POCBNP CBG: No results for input(s): GLUCAP in the last 168 hours. D-Dimer No results for input(s): DDIMER in the last 72 hours. Hgb A1c No results for input(s): HGBA1C in the last 72 hours. Lipid Profile No results for input(s): CHOL, HDL, LDLCALC, TRIG, CHOLHDL, LDLDIRECT in the last 72 hours. Thyroid function studies Recent Labs    12/06/18 2318  TSH 3.356   Anemia work up No results for input(s): VITAMINB12, FOLATE, FERRITIN, TIBC, IRON, RETICCTPCT in the last 72 hours. Urinalysis    Component Value Date/Time   COLORURINE YELLOW 12/06/2018 1824   APPEARANCEUR CLEAR 12/06/2018 1824   LABSPEC 1.018 12/06/2018 1824   PHURINE 6.0 12/06/2018 1824   GLUCOSEU NEGATIVE 12/06/2018 1824    HGBUR NEGATIVE 12/06/2018 1824   BILIRUBINUR NEGATIVE 12/06/2018 1824   KETONESUR NEGATIVE 12/06/2018 1824   PROTEINUR NEGATIVE 12/06/2018 1824   UROBILINOGEN 0.2 07/12/2012 1448   NITRITE NEGATIVE 12/06/2018 1824   LEUKOCYTESUR NEGATIVE 12/06/2018 1824   Sepsis Labs Invalid input(s): PROCALCITONIN,  WBC,  LACTICIDVEN Microbiology Recent Results (from the past 240 hour(s))  Urine culture     Status: Abnormal   Collection Time: 12/06/18  6:28 PM   Specimen: Urine, Clean Catch  Result Value Ref Range Status   Specimen Description URINE, CLEAN CATCH  Final   Special Requests   Final    NONE Performed at [facility], [place] [address], [place], [place] [address]    Culture >=100,000 COLONIES/mL ENTEROCOCCUS FAECALIS (A)  Final   Report Status 12/08/2018 FINAL  Final   Organism ID, Bacteria ENTEROCOCCUS FAECALIS (A)  Final      Susceptibility   Enterococcus faecalis - MIC*    AMPICILLIN <=2 SENSITIVE Sensitive     LEVOFLOXACIN >=8 RESISTANT Resistant     NITROFURANTOIN <=16 SENSITIVE Sensitive     VANCOMYCIN 1 SENSITIVE Sensitive     * >=100,000 COLONIES/mL ENTEROCOCCUS FAECALIS  SARS Coronavirus 2 (CEPHEID - Performed in [place] hospital lab), Hosp Order     Status: None   Collection Time: 12/06/18  6:44 PM   Specimen: Nasopharyngeal Swab  Result Value Ref Range Status   SARS Coronavirus 2 NEGATIVE NEGATIVE Final    Comment: (NOTE) If result is NEGATIVE SARS-CoV-2 target nucleic acids are NOT DETECTED. The SARS-CoV-2 RNA is generally detectable in upper and lower  respiratory specimens during the acute phase of infection. The lowest  concentration of SARS-CoV-2 viral copies this assay can detect is 250  copies / mL. A negative result does not preclude SARS-CoV-2 infection  and should not be used as the sole basis for treatment or other  patient management decisions.  A negative result may occur with  improper specimen collection / handling, submission of specimen  other  than nasopharyngeal swab, presence of viral mutation(s) within the  areas targeted by this assay, and inadequate number of viral copies  (<250 copies / mL). A negative result must be combined with clinical  observations, patient history, and epidemiological information. If result is POSITIVE SARS-CoV-2 target nucleic acids are DETECTED. The SARS-CoV-2 RNA is generally detectable in upper and lower  respiratory specimens dur ing the acute phase of infection.  Positive  results are indicative of active infection with SARS-CoV-2.  Clinical  correlation with patient history and other diagnostic information is  necessary to determine patient infection status.  Positive results do  not rule out bacterial infection or co-infection with other viruses. If result is PRESUMPTIVE POSTIVE SARS-CoV-2 nucleic acids MAY BE PRESENT.   A presumptive positive result was obtained on the submitted specimen  and confirmed on repeat testing.  While 2019 novel coronavirus  (SARS-CoV-2) nucleic acids may be present in the submitted sample  additional confirmatory testing may be necessary for epidemiological  and / or clinical management purposes  to differentiate between  SARS-CoV-2 and other Sarbecovirus currently known to infect humans.  If clinically indicated additional testing with an alternate test  methodology [phone]) is advised. The SARS-CoV-2 RNA is generally  detectable in upper and lower respiratory sp ecimens during the acute  phase of infection. The expected result is Negative. Fact Sheet for Patients:  StrictlyIdeas.no Fact Sheet for Healthcare Providers: BankingDealers.co.za This test is not yet approved or cleared by the [place] FDA and has been authorized for detection and/or diagnosis of SARS-CoV-2 by FDA under an Emergency Use Authorization (EUA).  This EUA will remain in effect (meaning this test can be used) for the duration  of the COVID-19 declaration under Section 564(b)(1) of the Act, 21 U.S.C. section 360bbb-3(b)(1), unless the authorization is terminated or revoked sooner. Performed at [facility], [place] [address], [place], [place] [address]      Time coordinating discharge: 102mins  SIGNED:   Kathie Dike, MD  [facility] 12/09/2018, 8:22 PM   If 7PM-7AM, please contact night-coverage [email or web address]

## 2018-12-09 NOTE — Plan of Care (Signed)
  Problem: Clinical Measurements: Goal: Will remain free from infection Outcome: Progressing   Problem: Clinical Measurements: Goal: Diagnostic test results will improve Outcome: Progressing   Problem: Clinical Measurements: Goal: Cardiovascular complication will be avoided Outcome: Progressing   Problem: Nutrition: Goal: Adequate nutrition will be maintained Outcome: Progressing   Problem: Elimination: Goal: Will not experience complications related to urinary retention Outcome: Progressing   Problem: Pain Managment: Goal: General experience of comfort will improve Outcome: Progressing

## 2018-12-09 NOTE — Progress Notes (Signed)
Needs repeat attempt by PT for mobilization to chair. Once she is able to transfer then she could go home. I talked with daughter on phone yesterday and 24 hr care will be available. She will need to at least transfer to chair so linens can be changed on her bed and get to bedside commode.  Daughter said she has no equipment needs since she already has everything.

## 2018-12-09 NOTE — Progress Notes (Addendum)
Physical Therapy Treatment Patient Details Name: Gina Bowers MRN: [id number] DOB: 08-Aug-1925 Today's Date: 12/09/2018    History of Present Illness Pt is a 85 year old woman who resides at [place]. She bent down to pick something up from the floor and fell resulting in L non displaced greater trochanteric hip fx with plan for conservative tx and 50% WB. PMH: dementia, CAD, CVA, CHF, CABG, sleep apnea, falls, depression.    PT Comments    Pt performed transfer training and limited therapeutic exercises. She reports he daughter can provide +1 assistance at home and that she has all necessary DME.  Pt is progressing well from a mobility stand point if daughter can provided assistance.  Will inform supervising PT of need for change in recommendations at this time.  Pt to d/c home with assistance from daughter today.      Follow Up Recommendations  Supervision for mobility/OOB;Home health PT(reports daughter can assist with transfers.)     Equipment Recommendations  Other (comment)(reports she has all necessary dme)    Recommendations for Other Services       Precautions / Restrictions Precautions Precautions: Fall Restrictions Weight Bearing Restrictions: Yes LLE Weight Bearing: Partial weight bearing LLE Partial Weight Bearing Percentage or Pounds: 50    Mobility  Bed Mobility Overal bed mobility: Needs Assistance Bed Mobility: Supine to Sit     Supine to sit: Mod assist     General bed mobility comments: Moderate assistance to advance LEs to edge of bed and elevate trunk into sitting.  Transfers Overall transfer level: Needs assistance Equipment used: Rolling walker (2 wheeled) Transfers: Sit to/from [facility] Sit to Stand: Min assist Stand pivot transfers: Mod assist       General transfer comment: Min assistance to rise slowly from seated surface and moderate assistance to pivot from bed to recliner  chair.  Ambulation/Gait Ambulation/Gait assistance: Mod assist Gait Distance (Feet): (steps from bed to recliner) Assistive device: Rolling walker (2 wheeled) Gait Pattern/deviations: Step-to pattern Gait velocity: Decreased   General Gait Details: Pt continues with difficulty weight shifting and performs more of a pivot on RLE to get to recliner.  Moderate assistance to stedy and move RW during transfer.   Stairs             Wheelchair Mobility    Modified Rankin (Stroke Patients Only)       Balance Overall balance assessment: Needs assistance   Sitting balance-Leahy Scale: Fair     Standing balance support: Bilateral upper extremity supported Standing balance-Leahy Scale: Poor                              Cognition Arousal/Alertness: Awake/alert Behavior During Therapy: WFL for tasks assessed/performed Overall Cognitive Status: No family/caregiver present to determine baseline cognitive functioning                                 General Comments: WFL for simple tasks; pleasant, interacting and following commands appropriately. Some slowed processing, likely baseline cognition      Exercises General Exercises - Lower Extremity Ankle Circles/Pumps: AROM;Both;10 reps;Supine Quad Sets: AROM;Both;10 reps;Supine Long Arc Quad: AROM;Both;10 reps;Supine    General Comments        Pertinent Vitals/Pain Pain Assessment: 0-10 Pain Score: 6  Pain Location: LLE Pain Descriptors / Indicators: Grimacing;Guarding Pain Intervention(s): Monitored during session;Repositioned  Home Living                      Prior Function            PT Goals (current goals can now be found in the care plan section) Acute Rehab PT Goals Patient Stated Goal: To go home Potential to Achieve Goals: Good Progress towards PT goals: Progressing toward goals    Frequency    Min 3X/week      PT Plan Discharge plan needs to be updated     Co-evaluation              AM-PAC PT "6 Clicks" Mobility   Outcome Measure  Help needed turning from your back to your side while in a flat bed without using bedrails?: A Lot Help needed moving from lying on your back to sitting on the side of a flat bed without using bedrails?: A Lot Help needed moving to and from a bed to a chair (including a wheelchair)?: A Lot Help needed standing up from a chair using your arms (e.g., wheelchair or bedside chair)?: A Lot Help needed to walk in hospital room?: A Lot Help needed climbing 3-5 steps with a railing? : Total 6 Click Score: 11    End of Session Equipment Utilized During Treatment: Gait belt Activity Tolerance: Patient tolerated treatment well;Patient limited by pain Patient left: in chair;with call bell/phone within reach;with chair alarm set Nurse Communication: Mobility status PT Visit Diagnosis: Other abnormalities of gait and mobility (R26.89);Pain;Muscle weakness (generalized) (M62.81) Pain - Right/Left: Left Pain - part of body: Leg;Hip     Time: 1638-4665 PT Time Calculation (min) (ACUTE ONLY): 25 min  Charges:  $Therapeutic Exercise: 8-22 mins $Therapeutic Activity: 8-22 mins                     Governor Rooks, PTA Acute Rehabilitation Services Pager [phone] Office [phone]     Ioana Louks Eli Hose 12/09/2018, 4:02 PM

## 2018-12-09 NOTE — TOC Transition Note (Addendum)
Transition of Care [employer]) - CM/SW Discharge Note   Patient Details  Name: Gina Bowers MRN: [id number] Date of Birth: [birth year]  Transition of Care [facility]) CM/SW Contact:  Alberteen Sam, LCSW Phone Number: 12/09/2018, 3:45 PM   Clinical Narrative:     Patient is ready to return to [place] with [facility] PT/OT/ST to be resumed by [employer] that she had in place prior to discharge. RNCM Caryl Pina has faxed orders to them for [facility]. PTAR called for transport around 4:45 pm today. Patient's daughter [name] requests nurse call her when Corey Harold is picking patient up, nurse Neoma Laming made aware.   Final next level of care: Home w Home Health Services Barriers to Discharge: No Barriers Identified   Patient Goals and CMS Choice   CMS Medicare.gov Compare Post Acute Care list provided to:: Patient Represenative (must comment)(Carolyn (daughter)) Choice offered to / list presented to : Adult Children(Carolyn)  Discharge Placement              Patient [facility] bed at: [facility]) Patient to be transferred to facility by: [place] Name of family member notified: [name] Patient and family notified of of transfer: 12/09/18  Discharge Plan and Services     Post Acute Care Choice: Home Health                               Social Determinants of Health (SDOH) Interventions     Readmission Risk Interventions No flowsheet data found.

## 2018-12-11 DIAGNOSIS — R278 Other lack of coordination: Secondary | ICD-10-CM | POA: Diagnosis not present

## 2018-12-11 DIAGNOSIS — R2689 Other abnormalities of gait and mobility: Secondary | ICD-10-CM | POA: Diagnosis not present

## 2018-12-11 DIAGNOSIS — M6281 Muscle weakness (generalized): Secondary | ICD-10-CM | POA: Diagnosis not present

## 2018-12-11 DIAGNOSIS — R1310 Dysphagia, unspecified: Secondary | ICD-10-CM | POA: Diagnosis not present

## 2018-12-11 DIAGNOSIS — R1312 Dysphagia, oropharyngeal phase: Secondary | ICD-10-CM | POA: Diagnosis not present

## 2018-12-11 DIAGNOSIS — R41841 Cognitive communication deficit: Secondary | ICD-10-CM | POA: Diagnosis not present

## 2018-12-12 DIAGNOSIS — R1310 Dysphagia, unspecified: Secondary | ICD-10-CM | POA: Diagnosis not present

## 2018-12-12 DIAGNOSIS — R1312 Dysphagia, oropharyngeal phase: Secondary | ICD-10-CM | POA: Diagnosis not present

## 2018-12-12 DIAGNOSIS — M6281 Muscle weakness (generalized): Secondary | ICD-10-CM | POA: Diagnosis not present

## 2018-12-12 DIAGNOSIS — R2689 Other abnormalities of gait and mobility: Secondary | ICD-10-CM | POA: Diagnosis not present

## 2018-12-12 DIAGNOSIS — R41841 Cognitive communication deficit: Secondary | ICD-10-CM | POA: Diagnosis not present

## 2018-12-12 DIAGNOSIS — R278 Other lack of coordination: Secondary | ICD-10-CM | POA: Diagnosis not present

## 2018-12-13 ENCOUNTER — Encounter: Payer: Self-pay | Admitting: Orthopaedic Surgery

## 2018-12-13 ENCOUNTER — Telehealth: Payer: Self-pay

## 2018-12-13 ENCOUNTER — Ambulatory Visit (INDEPENDENT_AMBULATORY_CARE_PROVIDER_SITE_OTHER): Payer: Medicare Other

## 2018-12-13 ENCOUNTER — Ambulatory Visit (INDEPENDENT_AMBULATORY_CARE_PROVIDER_SITE_OTHER): Payer: Medicare Other | Admitting: Orthopaedic Surgery

## 2018-12-13 ENCOUNTER — Other Ambulatory Visit: Payer: Self-pay

## 2018-12-13 VITALS — Ht 61.0 in | Wt 168.0 lb

## 2018-12-13 DIAGNOSIS — Z743 Need for continuous supervision: Secondary | ICD-10-CM | POA: Diagnosis not present

## 2018-12-13 DIAGNOSIS — R41841 Cognitive communication deficit: Secondary | ICD-10-CM | POA: Diagnosis not present

## 2018-12-13 DIAGNOSIS — M25552 Pain in left hip: Secondary | ICD-10-CM

## 2018-12-13 DIAGNOSIS — R278 Other lack of coordination: Secondary | ICD-10-CM | POA: Diagnosis not present

## 2018-12-13 DIAGNOSIS — R52 Pain, unspecified: Secondary | ICD-10-CM | POA: Diagnosis not present

## 2018-12-13 DIAGNOSIS — N390 Urinary tract infection, site not specified: Secondary | ICD-10-CM | POA: Diagnosis not present

## 2018-12-13 DIAGNOSIS — S72112D Displaced fracture of greater trochanter of left femur, subsequent encounter for closed fracture with routine healing: Secondary | ICD-10-CM

## 2018-12-13 DIAGNOSIS — R279 Unspecified lack of coordination: Secondary | ICD-10-CM | POA: Diagnosis not present

## 2018-12-13 DIAGNOSIS — M6281 Muscle weakness (generalized): Secondary | ICD-10-CM | POA: Diagnosis not present

## 2018-12-13 DIAGNOSIS — R1312 Dysphagia, oropharyngeal phase: Secondary | ICD-10-CM | POA: Diagnosis not present

## 2018-12-13 DIAGNOSIS — R1310 Dysphagia, unspecified: Secondary | ICD-10-CM | POA: Diagnosis not present

## 2018-12-13 DIAGNOSIS — R2689 Other abnormalities of gait and mobility: Secondary | ICD-10-CM | POA: Diagnosis not present

## 2018-12-13 DIAGNOSIS — R0902 Hypoxemia: Secondary | ICD-10-CM | POA: Diagnosis not present

## 2018-12-13 NOTE — Telephone Encounter (Signed)
Please advise 

## 2018-12-13 NOTE — Telephone Encounter (Signed)
Gregary Signs with [facility] called on behalf of patient's daughter [name] stating that patient has been doing PT, but is having more pain after PT and unable to stand.  [name] would like to know if further evaluation is needed or what does Dr. Lorin Mercy recommend?  Cb# for [name] is [phone].  Please advise.  Thank you.

## 2018-12-13 NOTE — Telephone Encounter (Signed)
Work in this afternoon.

## 2018-12-13 NOTE — Progress Notes (Signed)
Office Visit Note   Patient: Gina Bowers           Date of Birth: [birth year]           MRN: [id number] Visit Date: 12/13/2018              Requested by: Lavone Orn, MD [place] [address] [place],  [place] [address] PCP: Lavone Orn, MD   Assessment & Plan: Visit Diagnoses:  1. Pain in left hip   2. Closed displaced fracture of greater trochanter of left femur with routine healing, subsequent encounter     Plan: Patient is a working at therapy at home apparently was lifting her leg passively with hip flexion and her knee extended and patient had sharp pain and cried out.  She not been able to walk or transfer since that time and pain is been severe.  They also were working on resisted knee flexion exercises.  Orders on patient left the hospital was to work on transfers and ambulation with a walker.  Patient brought in by EMS since her daughter cannot get her up and repeat x-rays are obtained which demonstrates a nondisplaced trochanteric fracture.  We will cancel her physical therapy and Occupational Therapy.  After couple days bedrest she can work on transferring to recliner once again with her daughter's assistance.  When she is able to transfer she can work on standing and ambulating short distances.  Follow-Up Instructions: Return in about 4 weeks (around 01/10/2019).   Orders:  Orders Placed This Encounter  Procedures  . XR HIP UNILAT W OR W/O PELVIS 2-3 VIEWS LEFT   No orders of the defined types were placed in this encounter.     Procedures: No procedures performed   Clinical Data: No additional findings.   Subjective: Chief Complaint  Patient presents with  . Left Hip - Fracture, Follow-up, Pain    DOI 12/06/2018    HPI follow-up left rib fracture seen in the hospital setting.  She was with home health physical therapy with a vigorous session and now cannot transfer cannot stand has severe pain and is not able to get to her recliner anymore.   Review of Systems unchanged since her hospitalization.   Objective: Vital Signs: Ht 5\' 1"  (1.549 m)   Wt 168 lb (76.2 kg)   BMI 31.74 kg/m   Physical Exam Constitutional:      Appearance: She is well-developed.  HENT:     Head: Normocephalic.     Right Ear: External ear normal.     Left Ear: External ear normal.  Eyes:     Pupils: Pupils are equal, round, and reactive to light.  Neck:     Thyroid: No thyromegaly.     Trachea: No tracheal deviation.  Cardiovascular:     Rate and Rhythm: Normal rate.  Pulmonary:     Effort: Pulmonary effort is normal.  Abdominal:     Palpations: Abdomen is soft.  Skin:    General: Skin is warm and dry.  Neurological:     Mental Status: She is alert and oriented to person, place, and time.  Psychiatric:        Behavior: Behavior normal.     Ortho Exam patient has pain with hip logroll.  Sciatic function ankle is intact.  Specialty Comments:  No specialty comments available.  Imaging: No results found.   PMFS History: Patient Active Problem List   Diagnosis Date Noted  . Closed fracture of left hip (  [place])   . Closed displaced fracture of greater trochanter of left femur ([employer]) 12/06/2018  . Pubic ramus fracture ([place]) 06/08/2017  . Acetabulum fracture ([place]) 06/08/2017  . Fall at home, initial encounter 06/08/2017  . UTI (urinary tract infection) 06/10/2016  . Acute pyelonephritis   . SIRS (systemic inflammatory response syndrome) (HCC)   . Epistaxis 03/09/2016  . Ocular migraine 11/04/2015  . Essential hypertension 11/04/2015  . HLD (hyperlipidemia) 11/04/2015  . Coronary artery disease involving coronary bypass graft of native heart 11/04/2015  . Fatigue 10/08/2015  . Urinary tract infection 10/05/2015  . Chest pain 10/04/2015  . Hypertension 10/04/2015  . Unstable angina ([place])   . Hypertensive heart disease without heart failure   . S/P CABG 2009-cath 10/07/15-medical Rx   . S/P coronary artery stent placement-2012   .  Acute CVA (cerebrovascular accident) ([place]) 08/17/2015  . Possible Renovascular hypertension 07/25/2015  . Hyperlipidemia 07/25/2015  . History of stroke-March 2017 (rt brain) 07/25/2015  . Vertebrobasilar artery stenosis 07/25/2015  . CAD (coronary artery disease) of artery bypass graft 06/06/2014  . Diastolic dysfunction-grade 1 with EF 60% 06/06/2014  . CVA (cerebral vascular accident) ([place]) 11/17/2011  . TIA post cath 10/07/15 11/13/2011  . Weakness 11/13/2011  . Obstructive sleep apnea 11/23/2008  . PULMONARY NODULE 11/23/2008  . Hypoxemia 11/23/2008  . Hypothyroidism 11/22/2008  . Anxiety state 11/22/2008  . Cerebral artery occlusion with cerebral infarction ([place]) 11/22/2008   Past Medical History:  Diagnosis Date  . Arthritis   . Blood transfusion 2007   AFTER HIP REPLACEMENT  . Carotid bruit    PT'S DAUGHTER STATES PT HAD RECENT CAROTID STUDY AND WAS TOLD NO SIGNIFICANT BLOCKAGES  . Chronic diastolic CHF (congestive heart failure) ([place])    a. 07/2015 Ech: Ef 60-65%, Gr1 DD.  Marland Kitchen Coronary artery disease    a. 2009 CABG 4, Dr. Servando Snare (LIMA to LAD , SVG to diagonal, SVG to circumflex, SVG to PDA); b. Cath 2012 in  Recent ill,[place] patent LIMA , Patent SVG to diagonal, occluded SVG to RCA. 5x31mm BMS->native RCA; c. 09/2015 Cath: LM nl, LAD 159m, D1 100, LCX small, OM1 90/small, OM2 70, RCA 45p, patent stent, VG->dRCA 100, VG->OM1 100, VG->Diag nl, LIMA->LAD nl->Med Rx.  . Depression   . Fracture FEB 2013   FRACTURED LEFT ANKLE--NO SURGERY--WAS IN BOOT UNTIL COUPLE WEEKS AGO  . GERD (gastroesophageal reflux disease)   . High cholesterol   . Hypothyroidism   . Ischemic colitis ([place]) 2010  . Kidney stones    IN THE PAST  . 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 [place] virus    COUPLE OF WEEKS AGO--ALL SYMTOMS RESOLVED PER PT  . Obstructive sleep apnea 2010  . Shortness of breath    AND WHEEZING AT TIMES--NO INHALERS  . Stroke ([place])    SEVERAL  STROKES -TOLD SHE HAS CEREBELLUM BLOCKAGE AS RESULT OF STROKE--BUT NEUROLOGIST SAID HE DID NOT WANT TO DO ANY PROCEDURE BECAUSE OF HER AGE .  PT HAS SLIGHT LEFT FOOT DROP-DRAGS FOOT WHEN WALKING - USES WALKER  . Urinary incontinence   . UTI (lower urinary tract infection)    HX OF UTI'S-- LAST TIME COUPLE OF MONTHS AGO  . Vertigo     Family History  Problem Relation Age of Onset  . Heart disease Mother     Past Surgical History:  Procedure Laterality Date  . ABDOMINAL HYSTERECTOMY    . APPENDECTOMY    . BLADDER  [place]  . CARDIAC CATHETERIZATION N/A 10/07/2015   Procedure: Left Heart Cath and Cors/Grafts Angiography;  Surgeon: Peter M Martinique, MD;  Location: [place] [facility];  Service: Cardiovascular;  Laterality: N/A;  . CATARACT EXTRACTION    . CORONARY ANGIOPLASTY  10/2010   WITH STENT PLACEMENT  . CORONARY ARTERY BYPASS GRAFT  2009  . CYSTOSCOPY WITH INJECTION  09/21/2011   Procedure: CYSTOSCOPY WITH INJECTION;  Surgeon: Ailene Rud, MD;  Location: [facility];  Service: Urology;  Laterality: N/A;  Peri Urethral Macroplastique Injection and Estring Placement  . HERNIA REPAIR    . HIP SURGERY    . JOINT REPLACEMENT  2007   HIP REPLACEMENT -LEFT  . TONSILLECTOMY     Social History   Occupational History  . Not on file  Tobacco Use  . Smoking status: Never Smoker  . Smokeless tobacco: Never Used  Substance and Sexual Activity  . Alcohol use: No  . Drug use: No  . Sexual activity: Not on file

## 2018-12-14 ENCOUNTER — Telehealth: Payer: Self-pay | Admitting: Orthopaedic Surgery

## 2018-12-14 DIAGNOSIS — R2689 Other abnormalities of gait and mobility: Secondary | ICD-10-CM | POA: Diagnosis not present

## 2018-12-14 DIAGNOSIS — R278 Other lack of coordination: Secondary | ICD-10-CM | POA: Diagnosis not present

## 2018-12-14 DIAGNOSIS — R1312 Dysphagia, oropharyngeal phase: Secondary | ICD-10-CM | POA: Diagnosis not present

## 2018-12-14 DIAGNOSIS — M6281 Muscle weakness (generalized): Secondary | ICD-10-CM | POA: Diagnosis not present

## 2018-12-14 DIAGNOSIS — R41841 Cognitive communication deficit: Secondary | ICD-10-CM | POA: Diagnosis not present

## 2018-12-14 DIAGNOSIS — R1310 Dysphagia, unspecified: Secondary | ICD-10-CM | POA: Diagnosis not present

## 2018-12-14 NOTE — Telephone Encounter (Signed)
Received call from [place] with [employer] needing clarification on patient's appointment yesterday and also to get some recommendations for the patient . [place] also asked if patient should continue speech therapy? The number to contact patient is [phone]

## 2018-12-14 NOTE — Telephone Encounter (Signed)
I called [place] and advised, cancel OT and PT. She is concerned for patient once she begins weightbearing again, and I advised Dr. Lorin Mercy was going to recheck patient in four weeks and that if he decided to re-order PT, he would. I advised she should contact patient's PCP for speech therapy orders.

## 2018-12-15 DIAGNOSIS — R488 Other symbolic dysfunctions: Secondary | ICD-10-CM | POA: Diagnosis not present

## 2018-12-15 DIAGNOSIS — R1310 Dysphagia, unspecified: Secondary | ICD-10-CM | POA: Diagnosis not present

## 2018-12-15 DIAGNOSIS — R1312 Dysphagia, oropharyngeal phase: Secondary | ICD-10-CM | POA: Diagnosis not present

## 2018-12-15 DIAGNOSIS — R41841 Cognitive communication deficit: Secondary | ICD-10-CM | POA: Diagnosis not present

## 2018-12-19 DIAGNOSIS — R488 Other symbolic dysfunctions: Secondary | ICD-10-CM | POA: Diagnosis not present

## 2018-12-19 DIAGNOSIS — R1310 Dysphagia, unspecified: Secondary | ICD-10-CM | POA: Diagnosis not present

## 2018-12-19 DIAGNOSIS — R1312 Dysphagia, oropharyngeal phase: Secondary | ICD-10-CM | POA: Diagnosis not present

## 2018-12-19 DIAGNOSIS — R41841 Cognitive communication deficit: Secondary | ICD-10-CM | POA: Diagnosis not present

## 2018-12-21 DIAGNOSIS — R1310 Dysphagia, unspecified: Secondary | ICD-10-CM | POA: Diagnosis not present

## 2018-12-21 DIAGNOSIS — R488 Other symbolic dysfunctions: Secondary | ICD-10-CM | POA: Diagnosis not present

## 2018-12-21 DIAGNOSIS — R41841 Cognitive communication deficit: Secondary | ICD-10-CM | POA: Diagnosis not present

## 2018-12-21 DIAGNOSIS — R1312 Dysphagia, oropharyngeal phase: Secondary | ICD-10-CM | POA: Diagnosis not present

## 2018-12-22 DIAGNOSIS — R1310 Dysphagia, unspecified: Secondary | ICD-10-CM | POA: Diagnosis not present

## 2018-12-22 DIAGNOSIS — R1312 Dysphagia, oropharyngeal phase: Secondary | ICD-10-CM | POA: Diagnosis not present

## 2018-12-22 DIAGNOSIS — R488 Other symbolic dysfunctions: Secondary | ICD-10-CM | POA: Diagnosis not present

## 2018-12-22 DIAGNOSIS — R41841 Cognitive communication deficit: Secondary | ICD-10-CM | POA: Diagnosis not present

## 2018-12-29 ENCOUNTER — Telehealth: Payer: Self-pay | Admitting: Radiology

## 2018-12-29 NOTE — Telephone Encounter (Signed)
I left voicemail for Almyra Free advising.

## 2018-12-29 NOTE — Telephone Encounter (Signed)
This is from Dr. Narda Amber last note "After couple days bedrest she can work on transferring to recliner once again with her daughter's assistance."  In this 85 year old patient I do not feel comfortable giving oxycodone for pain because this will increase her risk of falling where she already has fractures and difficulty transferring.  I will approve Ultram 50 mg 1 tab p.o. every 8 to 12 hours PRN pain number 40 tablets no refills

## 2018-12-29 NOTE — Telephone Encounter (Signed)
Gina Bowers - [facility] Nurse left message. Is it ok for patient to bed transfer, pivot to wheelchair to go to bathroom. Refill of oxycodone Next visit by Gina Bowers will be Thursday 8/13  Please call back [phone]

## 2018-12-29 NOTE — Telephone Encounter (Signed)
Please advise 

## 2018-12-30 MED ORDER — TRAMADOL HCL 50 MG PO TABS: ORAL_TABLET | ORAL | 0 refills | Status: DC

## 2018-12-30 NOTE — Telephone Encounter (Signed)
I called and spoke with family member. They requested Tramadol be called to [facility]. Medication called to pharmacy.

## 2018-12-31 ENCOUNTER — Other Ambulatory Visit: Payer: Self-pay | Admitting: Cardiovascular Disease

## 2019-01-02 DIAGNOSIS — R1310 Dysphagia, unspecified: Secondary | ICD-10-CM | POA: Diagnosis not present

## 2019-01-02 DIAGNOSIS — R488 Other symbolic dysfunctions: Secondary | ICD-10-CM | POA: Diagnosis not present

## 2019-01-02 DIAGNOSIS — M6281 Muscle weakness (generalized): Secondary | ICD-10-CM | POA: Diagnosis not present

## 2019-01-02 DIAGNOSIS — R1312 Dysphagia, oropharyngeal phase: Secondary | ICD-10-CM | POA: Diagnosis not present

## 2019-01-02 DIAGNOSIS — R278 Other lack of coordination: Secondary | ICD-10-CM | POA: Diagnosis not present

## 2019-01-02 DIAGNOSIS — R2681 Unsteadiness on feet: Secondary | ICD-10-CM | POA: Diagnosis not present

## 2019-01-02 DIAGNOSIS — R41841 Cognitive communication deficit: Secondary | ICD-10-CM | POA: Diagnosis not present

## 2019-01-03 ENCOUNTER — Encounter: Payer: Self-pay | Admitting: Orthopaedic Surgery

## 2019-01-03 ENCOUNTER — Telehealth: Payer: Self-pay | Admitting: Cardiovascular Disease

## 2019-01-03 ENCOUNTER — Other Ambulatory Visit: Payer: Self-pay

## 2019-01-03 ENCOUNTER — Ambulatory Visit (INDEPENDENT_AMBULATORY_CARE_PROVIDER_SITE_OTHER): Payer: Medicare Other | Admitting: Orthopaedic Surgery

## 2019-01-03 ENCOUNTER — Ambulatory Visit (INDEPENDENT_AMBULATORY_CARE_PROVIDER_SITE_OTHER): Payer: Medicare Other

## 2019-01-03 VITALS — BP 165/84 | HR 65 | Ht 61.0 in | Wt 168.0 lb

## 2019-01-03 DIAGNOSIS — M80059A Age-related osteoporosis with current pathological fracture, unspecified femur, initial encounter for fracture: Secondary | ICD-10-CM | POA: Diagnosis not present

## 2019-01-03 DIAGNOSIS — M25552 Pain in left hip: Secondary | ICD-10-CM

## 2019-01-03 NOTE — Progress Notes (Signed)
Office Visit Note   Patient: Gina Bowers           Date of Birth: 20-Apr-1926           MRN: [id number] Visit Date: 01/03/2019              Requested by: Lavone Orn, MD [address] [place],  [place] [address] PCP: Lavone Orn, MD   Assessment & Plan: Visit Diagnoses:  1. Pain in left hip   2. Hip fracture due to osteoporosis, initial encounter [facility])     Plan: Gina Bowers is accompanied by her daughter and here for follow-up evaluation of her left hip fracture.  Dr. Lorin Mercy initially saw Gina Bowers and diagnosed a fracture of the area of the left greater trochanter.  Follow-up films today demonstrate a fracture that extends from the greater trochanter to the lesser trochanter.  The fracture is short oblique and nondisplaced.  There appears to be some early callus.  She has had a previously inserted total hip replacement without any evidence of problem.  I would like her to start some physical therapy in about 2 weeks with weightbearing as tolerated using a walker and then follow-up in 3 weeks for another fell.  Now about a month since the injury  Follow-Up Instructions: Return in about 3 weeks (around 01/24/2019).   Orders:  Orders Placed This Encounter  Procedures  . XR HIP UNILAT W OR W/O PELVIS 2-3 VIEWS LEFT   No orders of the defined types were placed in this encounter.     Procedures: No procedures performed   Clinical Data: No additional findings.   Subjective: Chief Complaint  Patient presents with  . Left Hip - Pain  Patient presents today for her left hip pain. She fell on 12/06/2018 and was hospitalized for a broken hip. She saw Gina Bowers and had x-rays done on 12/13/2018. Gina Bowers is her normal orthopedic doctor and wanted to follow up with him. She said that her hip is improving. She takes tramadol in the evening, and tylenol during the day.   HPI  Review of Systems   Objective: Vital Signs: BP (!) 165/84   Pulse 65   Ht 5\' 1"  (1.549  m)   Wt 168 lb (76.2 kg)   BMI 31.74 kg/m   Physical Exam Constitutional:      Appearance: She is well-developed.  Eyes:     Pupils: Pupils are equal, round, and reactive to light.  Pulmonary:     Effort: Pulmonary effort is normal.  Skin:    General: Skin is warm and dry.  Neurological:     Mental Status: She is alert and oriented to person, place, and time.  Psychiatric:        Behavior: Behavior normal.     Ortho Exam evaluated in a wheelchair.  Does have some pain with internal/external rotation of her left hip.  Has some pain with flexion and extension of her hip as well.  No localized tenderness laterally.  Motor exam intact  Specialty Comments:  No specialty comments available.  Imaging: Xr Hip Unilat W Or W/o Pelvis 2-3 Views Left  Result Date: 01/03/2019 AP pelvis and lateral of the left hip were obtained.  There is a nondisplaced short oblique fracture starting in the greater trochanter extending across the femoral component of the hip replacement to just below the lesser trochanter.  There appears to be some early callus.  The fracture is about a 107 old  PMFS History: Patient Active Problem List   Diagnosis Date Noted  . Hip fracture due to osteoporosis, initial encounter ([place]) 01/03/2019  . Closed fracture of left hip ([place])   . Closed displaced fracture of greater trochanter of left femur ([place]) 12/06/2018  . Pubic ramus fracture ([place]) 06/08/2017  . Acetabulum fracture ([place]) 06/08/2017  . Fall at home, initial encounter 06/08/2017  . UTI (urinary tract infection) 06/10/2016  . Acute pyelonephritis   . SIRS (systemic inflammatory response syndrome) (HCC)   . Epistaxis 03/09/2016  . Ocular migraine 11/04/2015  . Essential hypertension 11/04/2015  . HLD (hyperlipidemia) 11/04/2015  . Coronary artery disease involving coronary bypass graft of native heart 11/04/2015  . Fatigue 10/08/2015  . Urinary tract infection 10/05/2015  . Chest pain 10/04/2015  .  Hypertension 10/04/2015  . Unstable angina ([place])   . Hypertensive heart disease without heart failure   . S/P CABG 2009-cath 10/07/15-medical Rx   . S/P coronary artery stent placement-2012   . Acute CVA (cerebrovascular accident) ([place]) 08/17/2015  . Possible Renovascular hypertension 07/25/2015  . Hyperlipidemia 07/25/2015  . History of stroke-March 2017 (rt brain) 07/25/2015  . Vertebrobasilar artery stenosis 07/25/2015  . CAD (coronary artery disease) of artery bypass graft 06/06/2014  . Diastolic dysfunction-grade 1 with EF 60% 06/06/2014  . CVA (cerebral vascular accident) ([place]) 11/17/2011  . TIA post cath 10/07/15 11/13/2011  . Weakness 11/13/2011  . Obstructive sleep apnea 11/23/2008  . PULMONARY NODULE 11/23/2008  . Hypoxemia 11/23/2008  . Hypothyroidism 11/22/2008  . Anxiety state 11/22/2008  . Cerebral artery occlusion with cerebral infarction ([place]) 11/22/2008   Past Medical History:  Diagnosis Date  . Arthritis   . Blood transfusion 2007   AFTER HIP REPLACEMENT  . Carotid bruit    PT'S DAUGHTER STATES PT HAD RECENT CAROTID STUDY AND WAS TOLD NO SIGNIFICANT BLOCKAGES  . Chronic diastolic CHF (congestive heart failure) ([place])    a. 07/2015 Ech: Ef 60-65%, Gr1 DD.  Marland Kitchen Coronary artery disease    a. 2009 CABG 4, Dr. Servando Snare (LIMA to LAD , SVG to diagonal, SVG to circumflex, SVG to PDA); b. Cath 2012 in  Recent ill,[place] patent LIMA , Patent SVG to diagonal, occluded SVG to RCA. 5x69mm BMS->native RCA; c. 09/2015 Cath: LM nl, LAD 147m, D1 100, LCX small, OM1 90/small, OM2 70, RCA 45p, patent stent, VG->dRCA 100, VG->OM1 100, VG->Diag nl, LIMA->LAD nl->Med Rx.  . Depression   . Fracture FEB 2013   FRACTURED LEFT ANKLE--NO SURGERY--WAS IN BOOT UNTIL COUPLE WEEKS AGO  . GERD (gastroesophageal reflux disease)   . High cholesterol   . Hypothyroidism   . Ischemic colitis ([place]) 2010  . Kidney stones    IN THE PAST  . 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  [place] virus    COUPLE OF WEEKS AGO--ALL SYMTOMS RESOLVED PER PT  . Obstructive sleep apnea 2010  . Shortness of breath    AND WHEEZING AT TIMES--NO INHALERS  . Stroke ([place])    SEVERAL STROKES -TOLD SHE HAS CEREBELLUM BLOCKAGE AS RESULT OF STROKE--BUT NEUROLOGIST SAID HE DID NOT WANT TO DO ANY PROCEDURE BECAUSE OF HER AGE .  PT HAS SLIGHT LEFT FOOT DROP-DRAGS FOOT WHEN WALKING - USES WALKER  . Urinary incontinence   . UTI (lower urinary tract infection)    HX OF UTI'S-- LAST TIME COUPLE OF MONTHS AGO  . Vertigo     Family History  Problem Relation Age of Onset  .  Heart disease Mother     Past Surgical History:  Procedure Laterality Date  . ABDOMINAL HYSTERECTOMY    . APPENDECTOMY    . [place] AND 1990  . CARDIAC CATHETERIZATION N/A 10/07/2015   Procedure: Left Heart Cath and Cors/Grafts Angiography;  Surgeon: Abriel Geesey M Martinique, MD;  Location: [place] [facility];  Service: Cardiovascular;  Laterality: N/A;  . CATARACT EXTRACTION    . CORONARY ANGIOPLASTY  10/2010   WITH STENT PLACEMENT  . CORONARY ARTERY BYPASS GRAFT  2009  . CYSTOSCOPY WITH INJECTION  09/21/2011   Procedure: CYSTOSCOPY WITH INJECTION;  Surgeon: Ailene Rud, MD;  [facility];  Service: Urology;  Laterality: N/A;  Peri Urethral Macroplastique Injection and Estring Placement  . HERNIA REPAIR    . HIP SURGERY    . JOINT REPLACEMENT  2007   HIP REPLACEMENT -LEFT  . TONSILLECTOMY     Social History   Occupational History  . Not on file  Tobacco Use  . Smoking status: Never Smoker  . Smokeless tobacco: Never Used  Substance and Sexual Activity  . Alcohol use: No  . Drug use: No  . Sexual activity: Not on file

## 2019-01-03 NOTE — Telephone Encounter (Signed)
LVM for patient to call and schedule 1 year followup with Dr. Sallyanne Kuster.

## 2019-01-03 NOTE — Telephone Encounter (Signed)
Pt due for 12 month f/u. °Please contact pt for future appointment. °Pt needing refills. °

## 2019-01-06 ENCOUNTER — Ambulatory Visit: Payer: Medicare Other | Admitting: Orthopaedic Surgery

## 2019-01-09 DIAGNOSIS — R41841 Cognitive communication deficit: Secondary | ICD-10-CM | POA: Diagnosis not present

## 2019-01-09 DIAGNOSIS — R2681 Unsteadiness on feet: Secondary | ICD-10-CM | POA: Diagnosis not present

## 2019-01-09 DIAGNOSIS — R1312 Dysphagia, oropharyngeal phase: Secondary | ICD-10-CM | POA: Diagnosis not present

## 2019-01-09 DIAGNOSIS — R1310 Dysphagia, unspecified: Secondary | ICD-10-CM | POA: Diagnosis not present

## 2019-01-09 DIAGNOSIS — R278 Other lack of coordination: Secondary | ICD-10-CM | POA: Diagnosis not present

## 2019-01-09 DIAGNOSIS — M6281 Muscle weakness (generalized): Secondary | ICD-10-CM | POA: Diagnosis not present

## 2019-01-11 DIAGNOSIS — R41841 Cognitive communication deficit: Secondary | ICD-10-CM | POA: Diagnosis not present

## 2019-01-11 DIAGNOSIS — R1312 Dysphagia, oropharyngeal phase: Secondary | ICD-10-CM | POA: Diagnosis not present

## 2019-01-11 DIAGNOSIS — R278 Other lack of coordination: Secondary | ICD-10-CM | POA: Diagnosis not present

## 2019-01-11 DIAGNOSIS — R1310 Dysphagia, unspecified: Secondary | ICD-10-CM | POA: Diagnosis not present

## 2019-01-11 DIAGNOSIS — M6281 Muscle weakness (generalized): Secondary | ICD-10-CM | POA: Diagnosis not present

## 2019-01-11 DIAGNOSIS — R2681 Unsteadiness on feet: Secondary | ICD-10-CM | POA: Diagnosis not present

## 2019-01-16 DIAGNOSIS — R410 Disorientation, unspecified: Secondary | ICD-10-CM | POA: Diagnosis not present

## 2019-01-17 DIAGNOSIS — R1310 Dysphagia, unspecified: Secondary | ICD-10-CM | POA: Diagnosis not present

## 2019-01-17 DIAGNOSIS — R278 Other lack of coordination: Secondary | ICD-10-CM | POA: Diagnosis not present

## 2019-01-17 DIAGNOSIS — R41841 Cognitive communication deficit: Secondary | ICD-10-CM | POA: Diagnosis not present

## 2019-01-17 DIAGNOSIS — M6281 Muscle weakness (generalized): Secondary | ICD-10-CM | POA: Diagnosis not present

## 2019-01-17 DIAGNOSIS — R2681 Unsteadiness on feet: Secondary | ICD-10-CM | POA: Diagnosis not present

## 2019-01-17 DIAGNOSIS — R1312 Dysphagia, oropharyngeal phase: Secondary | ICD-10-CM | POA: Diagnosis not present

## 2019-01-18 DIAGNOSIS — R278 Other lack of coordination: Secondary | ICD-10-CM | POA: Diagnosis not present

## 2019-01-18 DIAGNOSIS — R41841 Cognitive communication deficit: Secondary | ICD-10-CM | POA: Diagnosis not present

## 2019-01-18 DIAGNOSIS — R2681 Unsteadiness on feet: Secondary | ICD-10-CM | POA: Diagnosis not present

## 2019-01-18 DIAGNOSIS — R1312 Dysphagia, oropharyngeal phase: Secondary | ICD-10-CM | POA: Diagnosis not present

## 2019-01-18 DIAGNOSIS — M6281 Muscle weakness (generalized): Secondary | ICD-10-CM | POA: Diagnosis not present

## 2019-01-18 DIAGNOSIS — R1310 Dysphagia, unspecified: Secondary | ICD-10-CM | POA: Diagnosis not present

## 2019-01-19 DIAGNOSIS — R278 Other lack of coordination: Secondary | ICD-10-CM | POA: Diagnosis not present

## 2019-01-19 DIAGNOSIS — R1312 Dysphagia, oropharyngeal phase: Secondary | ICD-10-CM | POA: Diagnosis not present

## 2019-01-19 DIAGNOSIS — M6281 Muscle weakness (generalized): Secondary | ICD-10-CM | POA: Diagnosis not present

## 2019-01-19 DIAGNOSIS — R2681 Unsteadiness on feet: Secondary | ICD-10-CM | POA: Diagnosis not present

## 2019-01-19 DIAGNOSIS — R41841 Cognitive communication deficit: Secondary | ICD-10-CM | POA: Diagnosis not present

## 2019-01-19 DIAGNOSIS — R1310 Dysphagia, unspecified: Secondary | ICD-10-CM | POA: Diagnosis not present

## 2019-01-23 ENCOUNTER — Telehealth: Payer: Self-pay | Admitting: Orthopaedic Surgery

## 2019-01-23 DIAGNOSIS — R1312 Dysphagia, oropharyngeal phase: Secondary | ICD-10-CM | POA: Diagnosis not present

## 2019-01-23 DIAGNOSIS — R278 Other lack of coordination: Secondary | ICD-10-CM | POA: Diagnosis not present

## 2019-01-23 DIAGNOSIS — R2681 Unsteadiness on feet: Secondary | ICD-10-CM | POA: Diagnosis not present

## 2019-01-23 DIAGNOSIS — R1310 Dysphagia, unspecified: Secondary | ICD-10-CM | POA: Diagnosis not present

## 2019-01-23 DIAGNOSIS — R41841 Cognitive communication deficit: Secondary | ICD-10-CM | POA: Diagnosis not present

## 2019-01-23 DIAGNOSIS — M6281 Muscle weakness (generalized): Secondary | ICD-10-CM | POA: Diagnosis not present

## 2019-01-23 NOTE — Telephone Encounter (Signed)
Called and relayed information to Shirlean Mylar at [employer]

## 2019-01-23 NOTE — Telephone Encounter (Signed)
[employer] requesting verbal orders for PT, and OT. They were hoping to start patient today. Shirlean Mylar advised doctor will be back in office tomorrow, Tuesday.

## 2019-01-23 NOTE — Telephone Encounter (Signed)
Ok for both

## 2019-01-23 NOTE — Telephone Encounter (Signed)
Please advise 

## 2019-01-24 DIAGNOSIS — R296 Repeated falls: Secondary | ICD-10-CM | POA: Diagnosis not present

## 2019-01-24 DIAGNOSIS — R2681 Unsteadiness on feet: Secondary | ICD-10-CM | POA: Diagnosis not present

## 2019-01-24 DIAGNOSIS — M6281 Muscle weakness (generalized): Secondary | ICD-10-CM | POA: Diagnosis not present

## 2019-01-24 DIAGNOSIS — R1310 Dysphagia, unspecified: Secondary | ICD-10-CM | POA: Diagnosis not present

## 2019-01-24 DIAGNOSIS — R41841 Cognitive communication deficit: Secondary | ICD-10-CM | POA: Diagnosis not present

## 2019-01-24 DIAGNOSIS — R1312 Dysphagia, oropharyngeal phase: Secondary | ICD-10-CM | POA: Diagnosis not present

## 2019-01-24 DIAGNOSIS — R488 Other symbolic dysfunctions: Secondary | ICD-10-CM | POA: Diagnosis not present

## 2019-01-24 DIAGNOSIS — M25552 Pain in left hip: Secondary | ICD-10-CM | POA: Diagnosis not present

## 2019-01-24 DIAGNOSIS — R278 Other lack of coordination: Secondary | ICD-10-CM | POA: Diagnosis not present

## 2019-01-25 DIAGNOSIS — M25552 Pain in left hip: Secondary | ICD-10-CM | POA: Diagnosis not present

## 2019-01-25 DIAGNOSIS — R41841 Cognitive communication deficit: Secondary | ICD-10-CM | POA: Diagnosis not present

## 2019-01-25 DIAGNOSIS — M6281 Muscle weakness (generalized): Secondary | ICD-10-CM | POA: Diagnosis not present

## 2019-01-25 DIAGNOSIS — R1312 Dysphagia, oropharyngeal phase: Secondary | ICD-10-CM | POA: Diagnosis not present

## 2019-01-25 DIAGNOSIS — R278 Other lack of coordination: Secondary | ICD-10-CM | POA: Diagnosis not present

## 2019-01-25 DIAGNOSIS — R2681 Unsteadiness on feet: Secondary | ICD-10-CM | POA: Diagnosis not present

## 2019-01-26 ENCOUNTER — Other Ambulatory Visit: Payer: Self-pay | Admitting: Cardiovascular Disease

## 2019-01-26 ENCOUNTER — Ambulatory Visit: Payer: Medicare Other | Admitting: Orthopaedic Surgery

## 2019-01-26 DIAGNOSIS — M25552 Pain in left hip: Secondary | ICD-10-CM | POA: Diagnosis not present

## 2019-01-26 DIAGNOSIS — R41841 Cognitive communication deficit: Secondary | ICD-10-CM | POA: Diagnosis not present

## 2019-01-26 DIAGNOSIS — R278 Other lack of coordination: Secondary | ICD-10-CM | POA: Diagnosis not present

## 2019-01-26 DIAGNOSIS — R1312 Dysphagia, oropharyngeal phase: Secondary | ICD-10-CM | POA: Diagnosis not present

## 2019-01-26 DIAGNOSIS — R2681 Unsteadiness on feet: Secondary | ICD-10-CM | POA: Diagnosis not present

## 2019-01-26 DIAGNOSIS — M6281 Muscle weakness (generalized): Secondary | ICD-10-CM | POA: Diagnosis not present

## 2019-01-27 ENCOUNTER — Other Ambulatory Visit: Payer: Self-pay | Admitting: Cardiovascular Disease

## 2019-01-27 DIAGNOSIS — M6281 Muscle weakness (generalized): Secondary | ICD-10-CM | POA: Diagnosis not present

## 2019-01-27 DIAGNOSIS — R1312 Dysphagia, oropharyngeal phase: Secondary | ICD-10-CM | POA: Diagnosis not present

## 2019-01-27 DIAGNOSIS — R41841 Cognitive communication deficit: Secondary | ICD-10-CM | POA: Diagnosis not present

## 2019-01-27 DIAGNOSIS — M25552 Pain in left hip: Secondary | ICD-10-CM | POA: Diagnosis not present

## 2019-01-27 DIAGNOSIS — R2681 Unsteadiness on feet: Secondary | ICD-10-CM | POA: Diagnosis not present

## 2019-01-27 DIAGNOSIS — R278 Other lack of coordination: Secondary | ICD-10-CM | POA: Diagnosis not present

## 2019-01-29 ENCOUNTER — Other Ambulatory Visit: Payer: Self-pay | Admitting: Cardiovascular Disease

## 2019-01-30 DIAGNOSIS — M25552 Pain in left hip: Secondary | ICD-10-CM | POA: Diagnosis not present

## 2019-01-30 DIAGNOSIS — M6281 Muscle weakness (generalized): Secondary | ICD-10-CM | POA: Diagnosis not present

## 2019-01-30 DIAGNOSIS — R1312 Dysphagia, oropharyngeal phase: Secondary | ICD-10-CM | POA: Diagnosis not present

## 2019-01-30 DIAGNOSIS — R2681 Unsteadiness on feet: Secondary | ICD-10-CM | POA: Diagnosis not present

## 2019-01-30 DIAGNOSIS — R278 Other lack of coordination: Secondary | ICD-10-CM | POA: Diagnosis not present

## 2019-01-30 DIAGNOSIS — R41841 Cognitive communication deficit: Secondary | ICD-10-CM | POA: Diagnosis not present

## 2019-01-31 ENCOUNTER — Ambulatory Visit (INDEPENDENT_AMBULATORY_CARE_PROVIDER_SITE_OTHER): Payer: Medicare Other | Admitting: Orthopaedic Surgery

## 2019-01-31 ENCOUNTER — Encounter: Payer: Self-pay | Admitting: Orthopaedic Surgery

## 2019-01-31 ENCOUNTER — Ambulatory Visit (INDEPENDENT_AMBULATORY_CARE_PROVIDER_SITE_OTHER): Payer: Medicare Other

## 2019-01-31 ENCOUNTER — Other Ambulatory Visit: Payer: Self-pay

## 2019-01-31 VITALS — BP 150/66 | HR 58 | Resp 13 | Ht 61.0 in | Wt 160.0 lb

## 2019-01-31 DIAGNOSIS — M6281 Muscle weakness (generalized): Secondary | ICD-10-CM | POA: Diagnosis not present

## 2019-01-31 DIAGNOSIS — M25552 Pain in left hip: Secondary | ICD-10-CM | POA: Diagnosis not present

## 2019-01-31 DIAGNOSIS — R1312 Dysphagia, oropharyngeal phase: Secondary | ICD-10-CM | POA: Diagnosis not present

## 2019-01-31 DIAGNOSIS — R41841 Cognitive communication deficit: Secondary | ICD-10-CM | POA: Diagnosis not present

## 2019-01-31 DIAGNOSIS — S72112D Displaced fracture of greater trochanter of left femur, subsequent encounter for closed fracture with routine healing: Secondary | ICD-10-CM | POA: Diagnosis not present

## 2019-01-31 DIAGNOSIS — R2681 Unsteadiness on feet: Secondary | ICD-10-CM | POA: Diagnosis not present

## 2019-01-31 DIAGNOSIS — R278 Other lack of coordination: Secondary | ICD-10-CM | POA: Diagnosis not present

## 2019-01-31 NOTE — Progress Notes (Signed)
Office Visit Note   Patient: Gina Bowers           Date of Birth: [birth year]           MRN: [id number] Visit Date: 01/31/2019              Requested by: Lavone Orn, MD [address] [place],  [place] [address] PCP: Lavone Orn, MD   Assessment & Plan: Visit Diagnoses:  1. Pain in left hip   2. Closed displaced fracture of greater trochanter of left femur with routine healing, subsequent encounter     Plan: 2 months status post injury with a resultant nondisplaced fracture of the left hip.  This is a peri-fracture about the left total hip replacement.  Fracture is healing without displacement and callus.  Will allow weightbearing as tolerated with a walker.  Continue with physical therapy.  Office 1 month  Follow-Up Instructions: Return in about 1 month (around 03/02/2019).   Orders:  Orders Placed This Encounter  Procedures  . XR HIP UNILAT W OR W/O PELVIS 2-3 VIEWS LEFT   No orders of the defined types were placed in this encounter.     Procedures: No procedures performed   Clinical Data: No additional findings.   Subjective: Chief Complaint  Patient presents with  . Left Hip - Follow-up DOI 12/06/2018  Patient presents in the office today accompanied by her daughter. She is following up for left hip pain. She is now 8 weeks out from injury.  Patient is taking tramadol at night and tylenol during the day for pain relief.  Receiving physical therapy but is weak.  Not having any appreciable pain  HPI  Review of Systems  Constitutional: Negative for chills and fatigue.  HENT: Negative for sore throat and tinnitus.   Eyes: Negative for pain.  Respiratory: Negative for shortness of breath.   Cardiovascular: Negative for chest pain.  Gastrointestinal: Negative for constipation and diarrhea.  Endocrine: Negative for polyuria.  Genitourinary: Negative for difficulty urinating and flank pain.  Musculoskeletal: Negative for neck pain.  Skin:  Negative for rash.  Allergic/Immunologic: Negative for immunocompromised state.  Neurological: Positive for headaches.  Hematological: Bruises/bleeds easily.  Psychiatric/Behavioral: The patient is not nervous/anxious.      Objective: Vital Signs: BP (!) 150/66 (BP Location: Right Arm, Patient Position: Sitting, Cuff Size: Normal)   Pulse (!) 58   Resp 13   Ht 5\' 1"  (1.549 m)   Wt 160 lb (72.6 kg)   BMI 30.23 kg/m   Physical Exam Constitutional:      Appearance: She is well-developed.  Eyes:     Pupils: Pupils are equal, round, and reactive to light.  Pulmonary:     Effort: Pulmonary effort is normal.  Skin:    General: Skin is warm and dry.  Neurological:     Mental Status: She is alert and oriented to person, place, and time.  Psychiatric:        Behavior: Behavior normal.     Ortho Exam hard of hearing.  Examined in a wheelchair with her daughter present.  No pain with range of motion left hip and internal and external rotation or flexion and extension.  No thigh pain  Specialty Comments:  No specialty comments available.  Imaging: Xr Hip Unilat W Or W/o Pelvis 2-3 Views Left  Result Date: 01/31/2019 AP pelvis and lateral of the left hip are obtained demonstrating the previously mentioned oblique fracture of the greater trochanter extending  into the subtrochanteric region.  The fracture is nondisplaced and there is callus.  Total hip replacement appears to be in good position without compromise. no change in position from prior films    PMFS History: Patient Active Problem List   Diagnosis Date Noted  . Hip fracture due to osteoporosis, initial encounter ([place]) 01/03/2019  . Closed fracture of left hip ([place])   . Closed displaced fracture of greater trochanter of left femur ([place]) 12/06/2018  . Pubic ramus fracture ([place]) 06/08/2017  . Acetabulum fracture ([place]) 06/08/2017  . Fall at home, initial encounter 06/08/2017  . UTI (urinary tract infection) 06/10/2016  .  Acute pyelonephritis   . SIRS (systemic inflammatory response syndrome) (H[facility])   . Epistaxis 03/09/2016  . Ocular migraine 11/04/2015  . Essential hypertension 11/04/2015  . HLD (hyperlipidemia) 11/04/2015  . Coronary artery disease involving coronary bypass graft of native heart 11/04/2015  . Fatigue 10/08/2015  . Urinary tract infection 10/05/2015  . Chest pain 10/04/2015  . Hypertension 10/04/2015  . Unstable angina ([place])   . Hypertensive heart disease without heart failure   . S/P CABG 2009-cath 10/07/15-medical Rx   . S/P coronary artery stent placement-2012   . Acute CVA (cerebrovascular accident) ([place]) 08/17/2015  . Possible Renovascular hypertension 07/25/2015  . Hyperlipidemia 07/25/2015  . History of stroke-March 2017 (rt brain) 07/25/2015  . Vertebrobasilar artery stenosis 07/25/2015  . CAD (coronary artery disease) of artery bypass graft 06/06/2014  . Diastolic dysfunction-grade 1 with EF 60% 06/06/2014  . CVA (cerebral vascular accident) ([place]) 11/17/2011  . TIA post cath 10/07/15 11/13/2011  . Weakness 11/13/2011  . Obstructive sleep apnea 11/23/2008  . PULMONARY NODULE 11/23/2008  . Hypoxemia 11/23/2008  . Hypothyroidism 11/22/2008  . Anxiety state 11/22/2008  . Cerebral artery occlusion with cerebral infarction ([place]) 11/22/2008   Past Medical History:  Diagnosis Date  . Arthritis   . Blood transfusion 2007   AFTER HIP REPLACEMENT  . Carotid bruit    PT'S DAUGHTER STATES PT HAD RECENT CAROTID STUDY AND WAS TOLD NO SIGNIFICANT BLOCKAGES  . Chronic diastolic CHF (congestive heart failure) ([place])    a. 07/2015 Ech: Ef 60-65%, Gr1 DD.  Marland Kitchen Coronary artery disease    a. 2009 CABG 4, Dr. Servando Snare (LIMA to LAD , SVG to diagonal, SVG to circumflex, SVG to PDA); b. Cath 2012 in  Recent ill,[place] patent LIMA , Patent SVG to diagonal, occluded SVG to RCA. 5x15mm BMS->native RCA; c. 09/2015 Cath: LM nl, LAD 182m, D1 100, LCX small, OM1 90/small, OM2 70, RCA 45p, patent stent,  VG->dRCA 100, VG->OM1 100, VG->Diag nl, LIMA->LAD nl->Med Rx.  . Depression   . Fracture FEB 2013   FRACTURED LEFT ANKLE--NO SURGERY--WAS IN BOOT UNTIL COUPLE WEEKS AGO  . GERD (gastroesophageal reflux disease)   . High cholesterol   . Hypothyroidism   . Ischemic colitis ([place]) 2010  . Kidney stones    IN THE PAST  . 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 [place] virus    COUPLE OF WEEKS AGO--ALL SYMTOMS RESOLVED PER PT  . Obstructive sleep apnea 2010  . Shortness of breath    AND WHEEZING AT TIMES--NO INHALERS  . Stroke ([place])    SEVERAL STROKES -TOLD SHE HAS CEREBELLUM BLOCKAGE AS RESULT OF STROKE--BUT NEUROLOGIST SAID HE DID NOT WANT TO DO ANY PROCEDURE BECAUSE OF HER AGE .  PT HAS SLIGHT LEFT FOOT DROP-DRAGS FOOT WHEN WALKING - USES WALKER  . Urinary incontinence   .  UTI (lower urinary tract infection)    HX OF UTI'S-- LAST TIME COUPLE OF MONTHS AGO  . Vertigo     Family History  Problem Relation Age of Onset  . Heart disease Mother     Past Surgical History:  Procedure Laterality Date  . ABDOMINAL HYSTERECTOMY    . APPENDECTOMY    . [place] AND 1990  . CARDIAC CATHETERIZATION N/A 10/07/2015   Procedure: Left Heart Cath and Cors/Grafts Angiography;  Surgeon: Kentavious Michele M Martinique, MD;  Location: [facility];  Service: Cardiovascular;  Laterality: N/A;  . CATARACT EXTRACTION    . CORONARY ANGIOPLASTY  10/2010   WITH STENT PLACEMENT  . CORONARY ARTERY BYPASS GRAFT  2009  . CYSTOSCOPY WITH INJECTION  09/21/2011   Procedure: CYSTOSCOPY WITH INJECTION;  Surgeon: Ailene Rud, MD;  Location: [facility];  Service: Urology;  Laterality: N/A;  Peri Urethral Macroplastique Injection and Estring Placement  . HERNIA REPAIR    . HIP SURGERY    . JOINT REPLACEMENT  2007   HIP REPLACEMENT -LEFT  . TONSILLECTOMY     Social History   Occupational History  . Not on file  Tobacco Use  . Smoking status: Never Smoker  . Smokeless tobacco: Never  Used  Substance and Sexual Activity  . Alcohol use: No  . Drug use: No  . Sexual activity: Not on file

## 2019-02-01 DIAGNOSIS — R41841 Cognitive communication deficit: Secondary | ICD-10-CM | POA: Diagnosis not present

## 2019-02-01 DIAGNOSIS — R278 Other lack of coordination: Secondary | ICD-10-CM | POA: Diagnosis not present

## 2019-02-01 DIAGNOSIS — M25552 Pain in left hip: Secondary | ICD-10-CM | POA: Diagnosis not present

## 2019-02-01 DIAGNOSIS — M6281 Muscle weakness (generalized): Secondary | ICD-10-CM | POA: Diagnosis not present

## 2019-02-01 DIAGNOSIS — R2681 Unsteadiness on feet: Secondary | ICD-10-CM | POA: Diagnosis not present

## 2019-02-01 DIAGNOSIS — R1312 Dysphagia, oropharyngeal phase: Secondary | ICD-10-CM | POA: Diagnosis not present

## 2019-02-02 DIAGNOSIS — R41841 Cognitive communication deficit: Secondary | ICD-10-CM | POA: Diagnosis not present

## 2019-02-02 DIAGNOSIS — R2681 Unsteadiness on feet: Secondary | ICD-10-CM | POA: Diagnosis not present

## 2019-02-02 DIAGNOSIS — M6281 Muscle weakness (generalized): Secondary | ICD-10-CM | POA: Diagnosis not present

## 2019-02-02 DIAGNOSIS — R1312 Dysphagia, oropharyngeal phase: Secondary | ICD-10-CM | POA: Diagnosis not present

## 2019-02-02 DIAGNOSIS — R278 Other lack of coordination: Secondary | ICD-10-CM | POA: Diagnosis not present

## 2019-02-02 DIAGNOSIS — M25552 Pain in left hip: Secondary | ICD-10-CM | POA: Diagnosis not present

## 2019-02-03 DIAGNOSIS — R41841 Cognitive communication deficit: Secondary | ICD-10-CM | POA: Diagnosis not present

## 2019-02-03 DIAGNOSIS — R278 Other lack of coordination: Secondary | ICD-10-CM | POA: Diagnosis not present

## 2019-02-03 DIAGNOSIS — R1312 Dysphagia, oropharyngeal phase: Secondary | ICD-10-CM | POA: Diagnosis not present

## 2019-02-03 DIAGNOSIS — M25552 Pain in left hip: Secondary | ICD-10-CM | POA: Diagnosis not present

## 2019-02-03 DIAGNOSIS — M6281 Muscle weakness (generalized): Secondary | ICD-10-CM | POA: Diagnosis not present

## 2019-02-03 DIAGNOSIS — R2681 Unsteadiness on feet: Secondary | ICD-10-CM | POA: Diagnosis not present

## 2019-02-06 DIAGNOSIS — R278 Other lack of coordination: Secondary | ICD-10-CM | POA: Diagnosis not present

## 2019-02-06 DIAGNOSIS — R1312 Dysphagia, oropharyngeal phase: Secondary | ICD-10-CM | POA: Diagnosis not present

## 2019-02-06 DIAGNOSIS — R41841 Cognitive communication deficit: Secondary | ICD-10-CM | POA: Diagnosis not present

## 2019-02-06 DIAGNOSIS — M25552 Pain in left hip: Secondary | ICD-10-CM | POA: Diagnosis not present

## 2019-02-06 DIAGNOSIS — R2681 Unsteadiness on feet: Secondary | ICD-10-CM | POA: Diagnosis not present

## 2019-02-06 DIAGNOSIS — M6281 Muscle weakness (generalized): Secondary | ICD-10-CM | POA: Diagnosis not present

## 2019-02-07 DIAGNOSIS — M6281 Muscle weakness (generalized): Secondary | ICD-10-CM | POA: Diagnosis not present

## 2019-02-07 DIAGNOSIS — R2681 Unsteadiness on feet: Secondary | ICD-10-CM | POA: Diagnosis not present

## 2019-02-07 DIAGNOSIS — R278 Other lack of coordination: Secondary | ICD-10-CM | POA: Diagnosis not present

## 2019-02-07 DIAGNOSIS — R1312 Dysphagia, oropharyngeal phase: Secondary | ICD-10-CM | POA: Diagnosis not present

## 2019-02-07 DIAGNOSIS — M25552 Pain in left hip: Secondary | ICD-10-CM | POA: Diagnosis not present

## 2019-02-07 DIAGNOSIS — R41841 Cognitive communication deficit: Secondary | ICD-10-CM | POA: Diagnosis not present

## 2019-02-08 ENCOUNTER — Telehealth: Payer: Self-pay | Admitting: Radiology

## 2019-02-08 DIAGNOSIS — R278 Other lack of coordination: Secondary | ICD-10-CM | POA: Diagnosis not present

## 2019-02-08 DIAGNOSIS — R2681 Unsteadiness on feet: Secondary | ICD-10-CM | POA: Diagnosis not present

## 2019-02-08 DIAGNOSIS — R1312 Dysphagia, oropharyngeal phase: Secondary | ICD-10-CM | POA: Diagnosis not present

## 2019-02-08 DIAGNOSIS — M6281 Muscle weakness (generalized): Secondary | ICD-10-CM | POA: Diagnosis not present

## 2019-02-08 DIAGNOSIS — R41841 Cognitive communication deficit: Secondary | ICD-10-CM | POA: Diagnosis not present

## 2019-02-08 DIAGNOSIS — M25552 Pain in left hip: Secondary | ICD-10-CM | POA: Diagnosis not present

## 2019-02-08 NOTE — Telephone Encounter (Signed)
Ok to fax orders as outlined-thanks

## 2019-02-08 NOTE — Telephone Encounter (Signed)
Almyra Free, RN with [facility] call on patients behalf regarding PT/OT/speech therapy orders. Almyra Free sees patient currently once every 2 weeks, however patient is receiving PT/OT on same day and is becoming very exhausted and only wants to sleep afterwards. Current orders are for as tolerated.  Can orders be modified for patient to receive only PT one day then only OT the next day.  Patient is being pushed too much to where afterwards she only wants to lay down and sleep for remainder of day. In bed pain is level 0. When ambulating at anytime pain is as level 6.  Please faxed modified orders to [facility] Phone # [phone] to request fax #  Also please fax orders to [facility] as well  Phone # [phone] Fax # [phone]   ATTN: Almyra Free - [facility]

## 2019-02-08 NOTE — Telephone Encounter (Signed)
Please see below.

## 2019-02-09 DIAGNOSIS — M25552 Pain in left hip: Secondary | ICD-10-CM | POA: Diagnosis not present

## 2019-02-09 DIAGNOSIS — R41841 Cognitive communication deficit: Secondary | ICD-10-CM | POA: Diagnosis not present

## 2019-02-09 DIAGNOSIS — R1312 Dysphagia, oropharyngeal phase: Secondary | ICD-10-CM | POA: Diagnosis not present

## 2019-02-09 DIAGNOSIS — M6281 Muscle weakness (generalized): Secondary | ICD-10-CM | POA: Diagnosis not present

## 2019-02-09 DIAGNOSIS — R2681 Unsteadiness on feet: Secondary | ICD-10-CM | POA: Diagnosis not present

## 2019-02-09 DIAGNOSIS — R278 Other lack of coordination: Secondary | ICD-10-CM | POA: Diagnosis not present

## 2019-02-10 DIAGNOSIS — R2681 Unsteadiness on feet: Secondary | ICD-10-CM | POA: Diagnosis not present

## 2019-02-10 DIAGNOSIS — R1312 Dysphagia, oropharyngeal phase: Secondary | ICD-10-CM | POA: Diagnosis not present

## 2019-02-10 DIAGNOSIS — R278 Other lack of coordination: Secondary | ICD-10-CM | POA: Diagnosis not present

## 2019-02-10 DIAGNOSIS — M25552 Pain in left hip: Secondary | ICD-10-CM | POA: Diagnosis not present

## 2019-02-10 DIAGNOSIS — R41841 Cognitive communication deficit: Secondary | ICD-10-CM | POA: Diagnosis not present

## 2019-02-10 DIAGNOSIS — M6281 Muscle weakness (generalized): Secondary | ICD-10-CM | POA: Diagnosis not present

## 2019-02-10 NOTE — Telephone Encounter (Signed)
Faxed to both numbers.

## 2019-02-12 DIAGNOSIS — M6281 Muscle weakness (generalized): Secondary | ICD-10-CM | POA: Diagnosis not present

## 2019-02-12 DIAGNOSIS — R41841 Cognitive communication deficit: Secondary | ICD-10-CM | POA: Diagnosis not present

## 2019-02-12 DIAGNOSIS — R2681 Unsteadiness on feet: Secondary | ICD-10-CM | POA: Diagnosis not present

## 2019-02-12 DIAGNOSIS — R1312 Dysphagia, oropharyngeal phase: Secondary | ICD-10-CM | POA: Diagnosis not present

## 2019-02-12 DIAGNOSIS — R278 Other lack of coordination: Secondary | ICD-10-CM | POA: Diagnosis not present

## 2019-02-12 DIAGNOSIS — M25552 Pain in left hip: Secondary | ICD-10-CM | POA: Diagnosis not present

## 2019-02-13 DIAGNOSIS — M6281 Muscle weakness (generalized): Secondary | ICD-10-CM | POA: Diagnosis not present

## 2019-02-13 DIAGNOSIS — R1312 Dysphagia, oropharyngeal phase: Secondary | ICD-10-CM | POA: Diagnosis not present

## 2019-02-13 DIAGNOSIS — R2681 Unsteadiness on feet: Secondary | ICD-10-CM | POA: Diagnosis not present

## 2019-02-13 DIAGNOSIS — R278 Other lack of coordination: Secondary | ICD-10-CM | POA: Diagnosis not present

## 2019-02-13 DIAGNOSIS — M25552 Pain in left hip: Secondary | ICD-10-CM | POA: Diagnosis not present

## 2019-02-13 DIAGNOSIS — R41841 Cognitive communication deficit: Secondary | ICD-10-CM | POA: Diagnosis not present

## 2019-02-14 DIAGNOSIS — M25552 Pain in left hip: Secondary | ICD-10-CM | POA: Diagnosis not present

## 2019-02-14 DIAGNOSIS — R278 Other lack of coordination: Secondary | ICD-10-CM | POA: Diagnosis not present

## 2019-02-14 DIAGNOSIS — M6281 Muscle weakness (generalized): Secondary | ICD-10-CM | POA: Diagnosis not present

## 2019-02-14 DIAGNOSIS — R2681 Unsteadiness on feet: Secondary | ICD-10-CM | POA: Diagnosis not present

## 2019-02-14 DIAGNOSIS — R1312 Dysphagia, oropharyngeal phase: Secondary | ICD-10-CM | POA: Diagnosis not present

## 2019-02-14 DIAGNOSIS — R41841 Cognitive communication deficit: Secondary | ICD-10-CM | POA: Diagnosis not present

## 2019-02-15 DIAGNOSIS — R2681 Unsteadiness on feet: Secondary | ICD-10-CM | POA: Diagnosis not present

## 2019-02-15 DIAGNOSIS — R278 Other lack of coordination: Secondary | ICD-10-CM | POA: Diagnosis not present

## 2019-02-15 DIAGNOSIS — M6281 Muscle weakness (generalized): Secondary | ICD-10-CM | POA: Diagnosis not present

## 2019-02-15 DIAGNOSIS — R1312 Dysphagia, oropharyngeal phase: Secondary | ICD-10-CM | POA: Diagnosis not present

## 2019-02-15 DIAGNOSIS — R41841 Cognitive communication deficit: Secondary | ICD-10-CM | POA: Diagnosis not present

## 2019-02-15 DIAGNOSIS — M25552 Pain in left hip: Secondary | ICD-10-CM | POA: Diagnosis not present

## 2019-02-16 DIAGNOSIS — M6281 Muscle weakness (generalized): Secondary | ICD-10-CM | POA: Diagnosis not present

## 2019-02-16 DIAGNOSIS — R1312 Dysphagia, oropharyngeal phase: Secondary | ICD-10-CM | POA: Diagnosis not present

## 2019-02-16 DIAGNOSIS — R2681 Unsteadiness on feet: Secondary | ICD-10-CM | POA: Diagnosis not present

## 2019-02-16 DIAGNOSIS — R278 Other lack of coordination: Secondary | ICD-10-CM | POA: Diagnosis not present

## 2019-02-16 DIAGNOSIS — R41841 Cognitive communication deficit: Secondary | ICD-10-CM | POA: Diagnosis not present

## 2019-02-16 DIAGNOSIS — M25552 Pain in left hip: Secondary | ICD-10-CM | POA: Diagnosis not present

## 2019-02-17 DIAGNOSIS — R1312 Dysphagia, oropharyngeal phase: Secondary | ICD-10-CM | POA: Diagnosis not present

## 2019-02-17 DIAGNOSIS — M25552 Pain in left hip: Secondary | ICD-10-CM | POA: Diagnosis not present

## 2019-02-17 DIAGNOSIS — R2681 Unsteadiness on feet: Secondary | ICD-10-CM | POA: Diagnosis not present

## 2019-02-17 DIAGNOSIS — R41841 Cognitive communication deficit: Secondary | ICD-10-CM | POA: Diagnosis not present

## 2019-02-17 DIAGNOSIS — R278 Other lack of coordination: Secondary | ICD-10-CM | POA: Diagnosis not present

## 2019-02-17 DIAGNOSIS — M6281 Muscle weakness (generalized): Secondary | ICD-10-CM | POA: Diagnosis not present

## 2019-02-20 DIAGNOSIS — R1312 Dysphagia, oropharyngeal phase: Secondary | ICD-10-CM | POA: Diagnosis not present

## 2019-02-20 DIAGNOSIS — R2681 Unsteadiness on feet: Secondary | ICD-10-CM | POA: Diagnosis not present

## 2019-02-20 DIAGNOSIS — R278 Other lack of coordination: Secondary | ICD-10-CM | POA: Diagnosis not present

## 2019-02-20 DIAGNOSIS — R41841 Cognitive communication deficit: Secondary | ICD-10-CM | POA: Diagnosis not present

## 2019-02-20 DIAGNOSIS — M6281 Muscle weakness (generalized): Secondary | ICD-10-CM | POA: Diagnosis not present

## 2019-02-20 DIAGNOSIS — M25552 Pain in left hip: Secondary | ICD-10-CM | POA: Diagnosis not present

## 2019-02-21 DIAGNOSIS — R1312 Dysphagia, oropharyngeal phase: Secondary | ICD-10-CM | POA: Diagnosis not present

## 2019-02-21 DIAGNOSIS — R41841 Cognitive communication deficit: Secondary | ICD-10-CM | POA: Diagnosis not present

## 2019-02-21 DIAGNOSIS — R278 Other lack of coordination: Secondary | ICD-10-CM | POA: Diagnosis not present

## 2019-02-21 DIAGNOSIS — M25552 Pain in left hip: Secondary | ICD-10-CM | POA: Diagnosis not present

## 2019-02-21 DIAGNOSIS — M6281 Muscle weakness (generalized): Secondary | ICD-10-CM | POA: Diagnosis not present

## 2019-02-21 DIAGNOSIS — R2681 Unsteadiness on feet: Secondary | ICD-10-CM | POA: Diagnosis not present

## 2019-02-22 DIAGNOSIS — R2681 Unsteadiness on feet: Secondary | ICD-10-CM | POA: Diagnosis not present

## 2019-02-22 DIAGNOSIS — R278 Other lack of coordination: Secondary | ICD-10-CM | POA: Diagnosis not present

## 2019-02-22 DIAGNOSIS — M6281 Muscle weakness (generalized): Secondary | ICD-10-CM | POA: Diagnosis not present

## 2019-02-22 DIAGNOSIS — R41841 Cognitive communication deficit: Secondary | ICD-10-CM | POA: Diagnosis not present

## 2019-02-22 DIAGNOSIS — M25552 Pain in left hip: Secondary | ICD-10-CM | POA: Diagnosis not present

## 2019-02-22 DIAGNOSIS — R1312 Dysphagia, oropharyngeal phase: Secondary | ICD-10-CM | POA: Diagnosis not present

## 2019-02-23 ENCOUNTER — Ambulatory Visit: Payer: Medicare Other | Admitting: Orthopaedic Surgery

## 2019-02-23 DIAGNOSIS — R488 Other symbolic dysfunctions: Secondary | ICD-10-CM | POA: Diagnosis not present

## 2019-02-23 DIAGNOSIS — R41841 Cognitive communication deficit: Secondary | ICD-10-CM | POA: Diagnosis not present

## 2019-02-23 DIAGNOSIS — M25552 Pain in left hip: Secondary | ICD-10-CM | POA: Diagnosis not present

## 2019-02-23 DIAGNOSIS — R1310 Dysphagia, unspecified: Secondary | ICD-10-CM | POA: Diagnosis not present

## 2019-02-23 DIAGNOSIS — R2681 Unsteadiness on feet: Secondary | ICD-10-CM | POA: Diagnosis not present

## 2019-02-23 DIAGNOSIS — R1312 Dysphagia, oropharyngeal phase: Secondary | ICD-10-CM | POA: Diagnosis not present

## 2019-02-23 DIAGNOSIS — R278 Other lack of coordination: Secondary | ICD-10-CM | POA: Diagnosis not present

## 2019-02-23 DIAGNOSIS — M6281 Muscle weakness (generalized): Secondary | ICD-10-CM | POA: Diagnosis not present

## 2019-02-23 DIAGNOSIS — R296 Repeated falls: Secondary | ICD-10-CM | POA: Diagnosis not present

## 2019-02-24 DIAGNOSIS — Z23 Encounter for immunization: Secondary | ICD-10-CM | POA: Diagnosis not present

## 2019-02-24 DIAGNOSIS — R1312 Dysphagia, oropharyngeal phase: Secondary | ICD-10-CM | POA: Diagnosis not present

## 2019-02-24 DIAGNOSIS — R2681 Unsteadiness on feet: Secondary | ICD-10-CM | POA: Diagnosis not present

## 2019-02-24 DIAGNOSIS — M6281 Muscle weakness (generalized): Secondary | ICD-10-CM | POA: Diagnosis not present

## 2019-02-24 DIAGNOSIS — R41841 Cognitive communication deficit: Secondary | ICD-10-CM | POA: Diagnosis not present

## 2019-02-24 DIAGNOSIS — M25552 Pain in left hip: Secondary | ICD-10-CM | POA: Diagnosis not present

## 2019-02-24 DIAGNOSIS — R278 Other lack of coordination: Secondary | ICD-10-CM | POA: Diagnosis not present

## 2019-02-27 DIAGNOSIS — R278 Other lack of coordination: Secondary | ICD-10-CM | POA: Diagnosis not present

## 2019-02-27 DIAGNOSIS — M6281 Muscle weakness (generalized): Secondary | ICD-10-CM | POA: Diagnosis not present

## 2019-02-27 DIAGNOSIS — R1312 Dysphagia, oropharyngeal phase: Secondary | ICD-10-CM | POA: Diagnosis not present

## 2019-02-27 DIAGNOSIS — R2681 Unsteadiness on feet: Secondary | ICD-10-CM | POA: Diagnosis not present

## 2019-02-27 DIAGNOSIS — M25552 Pain in left hip: Secondary | ICD-10-CM | POA: Diagnosis not present

## 2019-02-27 DIAGNOSIS — R41841 Cognitive communication deficit: Secondary | ICD-10-CM | POA: Diagnosis not present

## 2019-02-28 DIAGNOSIS — R1312 Dysphagia, oropharyngeal phase: Secondary | ICD-10-CM | POA: Diagnosis not present

## 2019-02-28 DIAGNOSIS — R2681 Unsteadiness on feet: Secondary | ICD-10-CM | POA: Diagnosis not present

## 2019-02-28 DIAGNOSIS — R41841 Cognitive communication deficit: Secondary | ICD-10-CM | POA: Diagnosis not present

## 2019-02-28 DIAGNOSIS — M25552 Pain in left hip: Secondary | ICD-10-CM | POA: Diagnosis not present

## 2019-02-28 DIAGNOSIS — M6281 Muscle weakness (generalized): Secondary | ICD-10-CM | POA: Diagnosis not present

## 2019-02-28 DIAGNOSIS — R278 Other lack of coordination: Secondary | ICD-10-CM | POA: Diagnosis not present

## 2019-03-01 ENCOUNTER — Other Ambulatory Visit: Payer: Self-pay | Admitting: Cardiovascular Disease

## 2019-03-01 DIAGNOSIS — M6281 Muscle weakness (generalized): Secondary | ICD-10-CM | POA: Diagnosis not present

## 2019-03-01 DIAGNOSIS — M25552 Pain in left hip: Secondary | ICD-10-CM | POA: Diagnosis not present

## 2019-03-01 DIAGNOSIS — R2681 Unsteadiness on feet: Secondary | ICD-10-CM | POA: Diagnosis not present

## 2019-03-01 DIAGNOSIS — R1312 Dysphagia, oropharyngeal phase: Secondary | ICD-10-CM | POA: Diagnosis not present

## 2019-03-01 DIAGNOSIS — R278 Other lack of coordination: Secondary | ICD-10-CM | POA: Diagnosis not present

## 2019-03-01 DIAGNOSIS — R41841 Cognitive communication deficit: Secondary | ICD-10-CM | POA: Diagnosis not present

## 2019-03-01 NOTE — Telephone Encounter (Signed)
Pt overdue for 12 month f/u. °Please contact pt for future appointment. °

## 2019-03-02 DIAGNOSIS — R2681 Unsteadiness on feet: Secondary | ICD-10-CM | POA: Diagnosis not present

## 2019-03-02 DIAGNOSIS — R1312 Dysphagia, oropharyngeal phase: Secondary | ICD-10-CM | POA: Diagnosis not present

## 2019-03-02 DIAGNOSIS — R41841 Cognitive communication deficit: Secondary | ICD-10-CM | POA: Diagnosis not present

## 2019-03-02 DIAGNOSIS — M25552 Pain in left hip: Secondary | ICD-10-CM | POA: Diagnosis not present

## 2019-03-02 DIAGNOSIS — R278 Other lack of coordination: Secondary | ICD-10-CM | POA: Diagnosis not present

## 2019-03-02 DIAGNOSIS — M6281 Muscle weakness (generalized): Secondary | ICD-10-CM | POA: Diagnosis not present

## 2019-03-03 DIAGNOSIS — R278 Other lack of coordination: Secondary | ICD-10-CM | POA: Diagnosis not present

## 2019-03-03 DIAGNOSIS — M25552 Pain in left hip: Secondary | ICD-10-CM | POA: Diagnosis not present

## 2019-03-03 DIAGNOSIS — M6281 Muscle weakness (generalized): Secondary | ICD-10-CM | POA: Diagnosis not present

## 2019-03-03 DIAGNOSIS — R1312 Dysphagia, oropharyngeal phase: Secondary | ICD-10-CM | POA: Diagnosis not present

## 2019-03-03 DIAGNOSIS — R2681 Unsteadiness on feet: Secondary | ICD-10-CM | POA: Diagnosis not present

## 2019-03-03 DIAGNOSIS — R41841 Cognitive communication deficit: Secondary | ICD-10-CM | POA: Diagnosis not present

## 2019-03-06 ENCOUNTER — Other Ambulatory Visit: Payer: Self-pay | Admitting: Cardiovascular Disease

## 2019-03-06 DIAGNOSIS — R2681 Unsteadiness on feet: Secondary | ICD-10-CM | POA: Diagnosis not present

## 2019-03-06 DIAGNOSIS — M6281 Muscle weakness (generalized): Secondary | ICD-10-CM | POA: Diagnosis not present

## 2019-03-06 DIAGNOSIS — R41841 Cognitive communication deficit: Secondary | ICD-10-CM | POA: Diagnosis not present

## 2019-03-06 DIAGNOSIS — R278 Other lack of coordination: Secondary | ICD-10-CM | POA: Diagnosis not present

## 2019-03-06 DIAGNOSIS — R1312 Dysphagia, oropharyngeal phase: Secondary | ICD-10-CM | POA: Diagnosis not present

## 2019-03-06 DIAGNOSIS — M25552 Pain in left hip: Secondary | ICD-10-CM | POA: Diagnosis not present

## 2019-03-07 DIAGNOSIS — R41841 Cognitive communication deficit: Secondary | ICD-10-CM | POA: Diagnosis not present

## 2019-03-07 DIAGNOSIS — R278 Other lack of coordination: Secondary | ICD-10-CM | POA: Diagnosis not present

## 2019-03-07 DIAGNOSIS — R1312 Dysphagia, oropharyngeal phase: Secondary | ICD-10-CM | POA: Diagnosis not present

## 2019-03-07 DIAGNOSIS — M25552 Pain in left hip: Secondary | ICD-10-CM | POA: Diagnosis not present

## 2019-03-07 DIAGNOSIS — M6281 Muscle weakness (generalized): Secondary | ICD-10-CM | POA: Diagnosis not present

## 2019-03-07 DIAGNOSIS — R2681 Unsteadiness on feet: Secondary | ICD-10-CM | POA: Diagnosis not present

## 2019-03-08 DIAGNOSIS — M25552 Pain in left hip: Secondary | ICD-10-CM | POA: Diagnosis not present

## 2019-03-08 DIAGNOSIS — R278 Other lack of coordination: Secondary | ICD-10-CM | POA: Diagnosis not present

## 2019-03-08 DIAGNOSIS — R1312 Dysphagia, oropharyngeal phase: Secondary | ICD-10-CM | POA: Diagnosis not present

## 2019-03-08 DIAGNOSIS — M6281 Muscle weakness (generalized): Secondary | ICD-10-CM | POA: Diagnosis not present

## 2019-03-08 DIAGNOSIS — R41841 Cognitive communication deficit: Secondary | ICD-10-CM | POA: Diagnosis not present

## 2019-03-08 DIAGNOSIS — R2681 Unsteadiness on feet: Secondary | ICD-10-CM | POA: Diagnosis not present

## 2019-03-09 DIAGNOSIS — R41841 Cognitive communication deficit: Secondary | ICD-10-CM | POA: Diagnosis not present

## 2019-03-09 DIAGNOSIS — M6281 Muscle weakness (generalized): Secondary | ICD-10-CM | POA: Diagnosis not present

## 2019-03-09 DIAGNOSIS — R1312 Dysphagia, oropharyngeal phase: Secondary | ICD-10-CM | POA: Diagnosis not present

## 2019-03-09 DIAGNOSIS — M25552 Pain in left hip: Secondary | ICD-10-CM | POA: Diagnosis not present

## 2019-03-09 DIAGNOSIS — R2681 Unsteadiness on feet: Secondary | ICD-10-CM | POA: Diagnosis not present

## 2019-03-09 DIAGNOSIS — R278 Other lack of coordination: Secondary | ICD-10-CM | POA: Diagnosis not present

## 2019-03-10 DIAGNOSIS — M6281 Muscle weakness (generalized): Secondary | ICD-10-CM | POA: Diagnosis not present

## 2019-03-10 DIAGNOSIS — M25552 Pain in left hip: Secondary | ICD-10-CM | POA: Diagnosis not present

## 2019-03-10 DIAGNOSIS — R278 Other lack of coordination: Secondary | ICD-10-CM | POA: Diagnosis not present

## 2019-03-10 DIAGNOSIS — R2681 Unsteadiness on feet: Secondary | ICD-10-CM | POA: Diagnosis not present

## 2019-03-10 DIAGNOSIS — R41841 Cognitive communication deficit: Secondary | ICD-10-CM | POA: Diagnosis not present

## 2019-03-10 DIAGNOSIS — R1312 Dysphagia, oropharyngeal phase: Secondary | ICD-10-CM | POA: Diagnosis not present

## 2019-03-13 DIAGNOSIS — M25552 Pain in left hip: Secondary | ICD-10-CM | POA: Diagnosis not present

## 2019-03-13 DIAGNOSIS — R41841 Cognitive communication deficit: Secondary | ICD-10-CM | POA: Diagnosis not present

## 2019-03-13 DIAGNOSIS — R2681 Unsteadiness on feet: Secondary | ICD-10-CM | POA: Diagnosis not present

## 2019-03-13 DIAGNOSIS — R1312 Dysphagia, oropharyngeal phase: Secondary | ICD-10-CM | POA: Diagnosis not present

## 2019-03-13 DIAGNOSIS — R278 Other lack of coordination: Secondary | ICD-10-CM | POA: Diagnosis not present

## 2019-03-13 DIAGNOSIS — M6281 Muscle weakness (generalized): Secondary | ICD-10-CM | POA: Diagnosis not present

## 2019-03-15 DIAGNOSIS — R41841 Cognitive communication deficit: Secondary | ICD-10-CM | POA: Diagnosis not present

## 2019-03-15 DIAGNOSIS — R2681 Unsteadiness on feet: Secondary | ICD-10-CM | POA: Diagnosis not present

## 2019-03-15 DIAGNOSIS — R278 Other lack of coordination: Secondary | ICD-10-CM | POA: Diagnosis not present

## 2019-03-15 DIAGNOSIS — M25552 Pain in left hip: Secondary | ICD-10-CM | POA: Diagnosis not present

## 2019-03-15 DIAGNOSIS — R1312 Dysphagia, oropharyngeal phase: Secondary | ICD-10-CM | POA: Diagnosis not present

## 2019-03-15 DIAGNOSIS — M6281 Muscle weakness (generalized): Secondary | ICD-10-CM | POA: Diagnosis not present

## 2019-03-16 DIAGNOSIS — R41841 Cognitive communication deficit: Secondary | ICD-10-CM | POA: Diagnosis not present

## 2019-03-16 DIAGNOSIS — R2681 Unsteadiness on feet: Secondary | ICD-10-CM | POA: Diagnosis not present

## 2019-03-16 DIAGNOSIS — R1312 Dysphagia, oropharyngeal phase: Secondary | ICD-10-CM | POA: Diagnosis not present

## 2019-03-16 DIAGNOSIS — R278 Other lack of coordination: Secondary | ICD-10-CM | POA: Diagnosis not present

## 2019-03-16 DIAGNOSIS — M6281 Muscle weakness (generalized): Secondary | ICD-10-CM | POA: Diagnosis not present

## 2019-03-16 DIAGNOSIS — M25552 Pain in left hip: Secondary | ICD-10-CM | POA: Diagnosis not present

## 2019-03-17 DIAGNOSIS — R41841 Cognitive communication deficit: Secondary | ICD-10-CM | POA: Diagnosis not present

## 2019-03-17 DIAGNOSIS — M25552 Pain in left hip: Secondary | ICD-10-CM | POA: Diagnosis not present

## 2019-03-17 DIAGNOSIS — R278 Other lack of coordination: Secondary | ICD-10-CM | POA: Diagnosis not present

## 2019-03-17 DIAGNOSIS — M6281 Muscle weakness (generalized): Secondary | ICD-10-CM | POA: Diagnosis not present

## 2019-03-17 DIAGNOSIS — R1312 Dysphagia, oropharyngeal phase: Secondary | ICD-10-CM | POA: Diagnosis not present

## 2019-03-17 DIAGNOSIS — R2681 Unsteadiness on feet: Secondary | ICD-10-CM | POA: Diagnosis not present

## 2019-03-20 ENCOUNTER — Other Ambulatory Visit: Payer: Self-pay | Admitting: Cardiovascular Disease

## 2019-03-20 DIAGNOSIS — M25552 Pain in left hip: Secondary | ICD-10-CM | POA: Diagnosis not present

## 2019-03-20 DIAGNOSIS — M6281 Muscle weakness (generalized): Secondary | ICD-10-CM | POA: Diagnosis not present

## 2019-03-20 DIAGNOSIS — R2681 Unsteadiness on feet: Secondary | ICD-10-CM | POA: Diagnosis not present

## 2019-03-20 DIAGNOSIS — R278 Other lack of coordination: Secondary | ICD-10-CM | POA: Diagnosis not present

## 2019-03-20 DIAGNOSIS — R41841 Cognitive communication deficit: Secondary | ICD-10-CM | POA: Diagnosis not present

## 2019-03-20 DIAGNOSIS — R1312 Dysphagia, oropharyngeal phase: Secondary | ICD-10-CM | POA: Diagnosis not present

## 2019-03-21 DIAGNOSIS — R2681 Unsteadiness on feet: Secondary | ICD-10-CM | POA: Diagnosis not present

## 2019-03-21 DIAGNOSIS — R278 Other lack of coordination: Secondary | ICD-10-CM | POA: Diagnosis not present

## 2019-03-21 DIAGNOSIS — M6281 Muscle weakness (generalized): Secondary | ICD-10-CM | POA: Diagnosis not present

## 2019-03-21 DIAGNOSIS — M25552 Pain in left hip: Secondary | ICD-10-CM | POA: Diagnosis not present

## 2019-03-21 DIAGNOSIS — R1312 Dysphagia, oropharyngeal phase: Secondary | ICD-10-CM | POA: Diagnosis not present

## 2019-03-21 DIAGNOSIS — R41841 Cognitive communication deficit: Secondary | ICD-10-CM | POA: Diagnosis not present

## 2019-03-22 ENCOUNTER — Telehealth (INDEPENDENT_AMBULATORY_CARE_PROVIDER_SITE_OTHER): Payer: Medicare Other | Admitting: Cardiovascular Disease

## 2019-03-22 DIAGNOSIS — I25708 Atherosclerosis of coronary artery bypass graft(s), unspecified, with other forms of angina pectoris: Secondary | ICD-10-CM

## 2019-03-22 DIAGNOSIS — Z8673 Personal history of transient ischemic attack (TIA), and cerebral infarction without residual deficits: Secondary | ICD-10-CM | POA: Diagnosis not present

## 2019-03-22 DIAGNOSIS — E7800 Pure hypercholesterolemia, unspecified: Secondary | ICD-10-CM

## 2019-03-22 DIAGNOSIS — R1312 Dysphagia, oropharyngeal phase: Secondary | ICD-10-CM | POA: Diagnosis not present

## 2019-03-22 DIAGNOSIS — I150 Renovascular hypertension: Secondary | ICD-10-CM | POA: Diagnosis not present

## 2019-03-22 DIAGNOSIS — R41841 Cognitive communication deficit: Secondary | ICD-10-CM | POA: Diagnosis not present

## 2019-03-22 DIAGNOSIS — I6509 Occlusion and stenosis of unspecified vertebral artery: Secondary | ICD-10-CM

## 2019-03-22 DIAGNOSIS — R2681 Unsteadiness on feet: Secondary | ICD-10-CM | POA: Diagnosis not present

## 2019-03-22 DIAGNOSIS — M25552 Pain in left hip: Secondary | ICD-10-CM | POA: Diagnosis not present

## 2019-03-22 DIAGNOSIS — I10 Essential (primary) hypertension: Secondary | ICD-10-CM | POA: Diagnosis not present

## 2019-03-22 DIAGNOSIS — G4733 Obstructive sleep apnea (adult) (pediatric): Secondary | ICD-10-CM

## 2019-03-22 DIAGNOSIS — I651 Occlusion and stenosis of basilar artery: Secondary | ICD-10-CM | POA: Diagnosis not present

## 2019-03-22 DIAGNOSIS — R278 Other lack of coordination: Secondary | ICD-10-CM | POA: Diagnosis not present

## 2019-03-22 DIAGNOSIS — M6281 Muscle weakness (generalized): Secondary | ICD-10-CM | POA: Diagnosis not present

## 2019-03-22 MED ORDER — LOSARTAN POTASSIUM 50 MG PO TABS: 50.0000 mg | ORAL_TABLET | Freq: Two times a day (BID) | ORAL | 3 refills | Status: DC

## 2019-03-22 MED ORDER — METOPROLOL SUCCINATE ER 25 MG PO TB24: 25.0000 mg | ORAL_TABLET | Freq: Every day | ORAL | 3 refills | Status: DC

## 2019-03-22 MED ORDER — ROSUVASTATIN CALCIUM 20 MG PO TABS: 20.0000 mg | ORAL_TABLET | Freq: Every day | ORAL | 3 refills | Status: AC

## 2019-03-22 MED ORDER — NITROGLYCERIN 0.4 MG SL SUBL: 0.4000 mg | SUBLINGUAL_TABLET | SUBLINGUAL | 1 refills | Status: AC | PRN

## 2019-03-22 NOTE — Progress Notes (Signed)
Virtual Visit via Video Note   This visit type was conducted due to national recommendations for restrictions regarding the COVID-19 Pandemic (e.g. social distancing) in an effort to limit this patient's exposure and mitigate transmission in our community.  Due to her co-morbid illnesses, this patient is at least at moderate risk for complications without adequate follow up.  This format is felt to be most appropriate for this patient at this time.  All issues noted in this document were discussed and addressed.  A limited physical exam was performed with this format.  Please refer to the patient's chart for her consent to telehealth for [facility].   Date:  03/22/2019   ID:  Gina Bowers, DOB May 02, 1926, MRN [id number]  Patient Location: Home Provider Location: Office  PCP:  Lavone Orn, MD  Cardiologist:  Larron Armor Electrophysiologist:  None   Evaluation Performed:  Follow-Up Visit  Chief Complaint:  CAD  History of Present Illness:    Gina Bowers is a 85 y.o. female with an extensive history of coronary, cerebrovascular and peripheral arterial disease who presents for follow-up.  She experienced a mechanical fall in July and had a nondisplaced fracture of the left hip surrounding the left total hip replacement.  This healed well and she is now doing weightbearing exercises with physical therapy.  She has 24/7 care at home and still requires PT/OT.  Her stamina is poor, but she is able to walk to the bathroom or the kitchen without shortness of breath.  She denies orthopnea, PND, leg edema and has not had angina either at rest or with activity.  She has not had syncope or palpitations.  She has not had any new falls or injuries or bleeding problems.  Not long ago her blood pressure was a little high but has improved after her losartan was redosed as 50 mg twice daily.  The patient does not have symptoms concerning for COVID-19 infection (fever, chills, cough, or new  shortness of breath).   May 2017 cardiac catheterization showed no significant change in her coronary anatomy. The stent in the right coronary artery is widely patent. The mammary artery to the LAD and SVG to diagonal bypasses are open. There is a long segment of 90% stenosis in the oblique marginal artery that was felt best managed by medical therapy. Left ventricular end-diastolic pressure was only 7 mmHg.        Her echo was unchanged: - Left ventricle: The cavity size was normal. Systolic function was  normal. The estimated ejection fraction was in the range of 60%  to 65%. Wall motion was normal; there were no regional wall  motion abnormalities. There was an increased relative  contribution of atrial contraction to ventricular filling.  Doppler parameters are consistent with abnormal left ventricular  relaxation (grade 1 diastolic dysfunction).  In 2009 she underwent four-vessel bypass surgery. In 2012 she had chest pain while in [facility] and cardiac catheterization showed occlusion of the SVG to RCA and a stent was placed in the 95% stenosis of the native RCA. There was also 90% stenosis of the left main coronary artery with a patent LIMA to the LAD and a patent SVG to the diagonal. There is no mention of the SVG to OM (OM is described as a moderate size vessel with a 75% proximal stenosis). A nuclear stress test in 2013 was normal and she has normal left ventricular systolic function by echo as well. Echo in 2013 showed evidence of diastolic  dysfunction as well as elevated filling pressures although congestive heart failure has not been a clinically evident problem. She has a history of ischemic colitis in 2010. She had a stroke in the remote past, Possibly another one in 2013. MRI has demonstrated chronic total occlusion of the left vertebral artery, moderate tandem stenosis of the right vertebral artery , high-grade stenosis of the proximal basilar artery,  Lesion of the left PICA as well as extensive intracerebral arteriovascular disease in the cerebrum. She has mild short-term memory loss.    Past Medical History:  Diagnosis Date   Arthritis    Blood transfusion 2007   AFTER HIP REPLACEMENT   Carotid bruit    PT'S DAUGHTER STATES PT HAD RECENT CAROTID STUDY AND WAS TOLD NO SIGNIFICANT BLOCKAGES   Chronic diastolic CHF (congestive heart failure) ([place])    a. 07/2015 Ech: Ef 60-65%, Gr1 DD.   Coronary artery disease    a. 2009 CABG 4, Dr. Servando Snare (LIMA to LAD , SVG to diagonal, SVG to circumflex, SVG to PDA); b. Cath 2012 in  Recent ill,[place] patent LIMA , Patent SVG to diagonal, occluded SVG to RCA. 5x70mm BMS->native RCA; c. 09/2015 Cath: LM nl, LAD 1104m, D1 100, LCX small, OM1 90/small, OM2 70, RCA 45p, patent stent, VG->dRCA 100, VG->OM1 100, VG->Diag nl, LIMA->LAD nl->Med Rx.   Depression    Fracture FEB 2013   FRACTURED LEFT ANKLE--NO SURGERY--WAS IN BOOT UNTIL COUPLE WEEKS AGO   GERD (gastroesophageal reflux disease)    High cholesterol    Hypothyroidism    Ischemic colitis ([place]) 2010   Kidney stones    IN THE PAST   Lung nodules    TOLD SHE HAS INTERSTITUAL LUNG DISEASE   [place]walk virus    COUPLE OF WEEKS AGO--ALL SYMTOMS RESOLVED PER PT   Obstructive sleep apnea 2010   Shortness of breath    AND WHEEZING AT TIMES--NO INHALERS   Stroke ([place])    SEVERAL STROKES -TOLD SHE HAS CEREBELLUM BLOCKAGE AS RESULT OF STROKE--BUT NEUROLOGIST SAID HE DID NOT WANT TO DO ANY PROCEDURE BECAUSE OF HER AGE .  PT HAS SLIGHT LEFT FOOT DROP-DRAGS FOOT WHEN WALKING - USES WALKER   Urinary incontinence    UTI (lower urinary tract infection)    HX OF UTI'S-- LAST TIME COUPLE OF MONTHS AGO   Vertigo    Past Surgical History:  Procedure Laterality Date   ABDOMINAL HYSTERECTOMY     APPENDECTOMY     BLADDER TACK     1969 AND 1990   CARDIAC CATHETERIZATION N/A 10/07/2015   Procedure: Left Heart Cath and Cors/Grafts  Angiography;  Surgeon: Peter M Martinique, MD;  Location: [facility];  Service: Cardiovascular;  Laterality: N/A;   CATARACT EXTRACTION     CORONARY ANGIOPLASTY  10/2010   WITH STENT PLACEMENT   CORONARY ARTERY BYPASS GRAFT  2009   CYSTOSCOPY WITH INJECTION  09/21/2011   Procedure: CYSTOSCOPY WITH INJECTION;  Surgeon: Ailene Rud, MD;  [facility];  Service: Urology;  Laterality: N/A;  Peri Urethral Macroplastique Injection and Estring Placement   HERNIA REPAIR     HIP SURGERY     JOINT REPLACEMENT  2007   HIP REPLACEMENT -LEFT   TONSILLECTOMY       Current Meds  Medication Sig   acetaminophen (TYLENOL) 500 MG tablet Take 1,000 mg by mouth every 6 (six) hours as needed for mild pain or fever.    Albuterol Sulfate (PROAIR RESPICLICK) 123XX123 (90 Base) MCG/ACT  AEPB Inhale 2 puffs into the lungs daily as needed (for shortness of breath).    alendronate (FOSAMAX) 70 MG tablet Take 1 tablet (70 mg total) by mouth once a week. Take with a full glass of water on an empty stomach.mon (Patient taking differently: Take 70 mg by mouth every Monday. Take with a full glass of water on an empty stomach)   aspirin EC 81 MG tablet Take 81 mg by mouth every evening.    clopidogrel (PLAVIX) 75 MG tablet Take 1 tablet (75 mg total) by mouth daily.   Cranberry 500 MG CAPS Take 500 mg by mouth 2 (two) times a day.   D-Mannose 500 MG CAPS Take 500 mg by mouth 2 (two) times a day.   docusate sodium (COLACE) 100 MG capsule Take 100 mg by mouth every evening.   fish oil-omega-3 fatty acids 1000 MG capsule Take 1 g by mouth every evening.    ipratropium-albuterol (DUONEB) 0.5-2.5 (3) MG/3ML SOLN Take 3 mLs by nebulization every 4 (four) hours as needed. (Patient taking differently: Take 3 mLs by nebulization every 4 (four) hours as needed (for wheezing or shortness of breath). )   isosorbide mononitrate (IMDUR) 30 MG 24 hr tablet TAKE ONE HALF TABLET (15 MG) BY MOUTH DAILY. OFFICE  VISIT NEEDED   levothyroxine (SYNTHROID, LEVOTHROID) 88 MCG tablet Take 1 tablet (88 mcg total) by mouth daily before breakfast.   losartan (COZAAR) 50 MG tablet Take 1 tablet (50 mg total) by mouth 2 (two) times daily.   metoprolol succinate (TOPROL-XL) 25 MG 24 hr tablet Take 1 tablet (25 mg total) by mouth daily.   Multiple Vitamins-Minerals (ONE-A-DAY PROACTIVE 65+) TABS Take 1 tablet by mouth daily with breakfast.   nitroGLYCERIN (NITROSTAT) 0.4 MG SL tablet Place 1 tablet (0.4 mg total) under the tongue every 5 (five) minutes as needed for chest pain.   ondansetron (ZOFRAN ODT) 4 MG disintegrating tablet Take 1 tablet (4 mg total) by mouth every 6 (six) hours as needed for nausea or vomiting.   pantoprazole (PROTONIX) 40 MG tablet TAKE 1 TABLET BY MOUTH DAILY  NEEDS APPOINTMENT FOR FUTHER REFILLS   Probiotic Product (MEGA PROBIOTIC) CAPS Take 1 capsule by mouth daily with breakfast. Mega Food/MegaFloraWomen's Probiotic   rosuvastatin (CRESTOR) 20 MG tablet Take 1 tablet (20 mg total) by mouth daily.   sertraline (ZOLOFT) 50 MG tablet Take 1 tablet (50 mg total) by mouth daily. (Patient taking differently: Take 50 mg by mouth every evening. )   traMADol (ULTRAM) 50 MG tablet Take one tablet every 8-12 hours as needed for pain.   [DISCONTINUED] losartan (COZAAR) 25 MG tablet TAKE ONE TABLET BY MOUTH TWICE A DAY (Patient taking differently: 50 mg 2 (two) times daily. )   [DISCONTINUED] metoprolol succinate (TOPROL-XL) 25 MG 24 hr tablet TAKE ONE TABLET BY MOUTH DAILY (Patient taking differently: Take 25 mg by mouth daily. )   [DISCONTINUED] nitroGLYCERIN (NITROSTAT) 0.4 MG SL tablet Place 1 tablet (0.4 mg total) under the tongue every 5 (five) minutes as needed for chest pain.   [DISCONTINUED] rosuvastatin (CRESTOR) 20 MG tablet TAKE ONE TABLET BY MOUTH DAILY     Allergies:   Meclizine, Nitrofurantoin, Sulfa antibiotics, and Sulfonamide derivatives   Social History   Tobacco  Use   Smoking status: Never Smoker   Smokeless tobacco: Never Used  Substance Use Topics   Alcohol use: No   Drug use: No     Family Hx: The patient's family history includes  Heart disease in her mother.  ROS:   Please see the history of present illness.     All other systems reviewed and are negative.   Prior CV studies:   The following studies were reviewed today:  Notes from recent hospitalizations Labs/Other Tests and Data Reviewed:   \ EKG:  An ECG dated 01/20/2018 was personally reviewed today and demonstrated:  Sinus rhythm with single PVC, voltage criteria only for LV hypertrophy  Recent Labs: 12/06/2018: TSH 3.356 12/07/2018: BUN 17; Creatinine, Ser 0.51; Hemoglobin 10.8; Platelets 176; Potassium 3.5; Sodium 136   Recent Lipid Panel Lab Results  Component Value Date/Time   CHOL 147 08/17/2015 06:48 AM   TRIG 70 08/17/2015 06:48 AM   HDL 58 08/17/2015 06:48 AM   CHOLHDL 2.5 08/17/2015 06:48 AM   LDLCALC 75 08/17/2015 06:48 AM    Wt Readings from Last 3 Encounters:  03/22/19 158 lb 9.6 oz (71.9 kg)  01/31/19 160 lb (72.6 kg)  01/03/19 168 lb (76.2 kg)     Objective:    Vital Signs:  BP (!) 148/82    Pulse 61    Temp (!) 96.9 F (36.1 C)    Ht 5\' 1"  (1.549 m)    Wt 158 lb 9.6 oz (71.9 kg)    SpO2 93%    BMI 29.97 kg/m    VITAL SIGNS:  reviewed GEN:  no acute distress EYES:  sclerae anicteric, EOMI - Extraocular Movements Intact RESPIRATORY:  normal respiratory effort, symmetric expansion CARDIOVASCULAR:  no peripheral edema SKIN:  no rash, lesions or ulcers. MUSCULOSKELETAL:  no obvious deformities. NEURO:  alert and oriented x 3, no obvious focal deficit PSYCH:  normal affect Hard of hearing  ASSESSMENT & PLAN:    1. CAD s/p CABG:  Asymptomatic on 2 antianginal medications.  Taking dual antiplatelet therapy and statin.  Option was presented for OM percutaneous revascularization and stent, but this was felt to be challenging and with low  likelihood of long-term patency due to the small caliber and lengthy segment of stenosis in this vessel.    PCI did not appear necessary since symptoms are well controlled. 2. Diastolic dysfunction by echo:  She appears to be clinically euvolemic.  Trying to avoid diuretics since she has had problems with orthostatic hypotension. 3. HTN:  Her daughter reports that her typical blood pressures in the 135/75 range.  It is important not to drop her blood pressure too low since she has severe cerebrovascular disease and a history of orthostatic hypotension.  No changes made to her medications today. 4. ASCVD: she has severe stenosis in the vertebral basilar system with occluded left vertebral artery and left PICA. She has had a stroke in the past. 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 MRI 2014 showed remote posterior right corona radiata infarction, possibly associated with her tendency for foot drop.  Has severe dizziness, unsteady gait.  Receiving PT/OT. 5. HLP:  She has not had a recent lipid profile.  On very potent statin.  The most recent labs showed an LDL cholesterol of 75 which is close to target range.  We need to get updated labs.  We will send her a lab request slip.  COVID-19 Education: The signs and symptoms of COVID-19 were discussed with the patient and how to seek care for testing (follow up with PCP or arrange E-visit).  The importance of social distancing was discussed today.  Time:   Today, I have spent 21 minutes with the patient with telehealth technology discussing the above problems.     Medication Adjustments/Labs and Tests Ordered: Current medicines are reviewed at length with the patient today.  Concerns regarding medicines are outlined above.   Tests Ordered: Orders Placed This Encounter  Procedures   Lipid panel    Medication Changes: Meds ordered this encounter  Medications   losartan  (COZAAR) 50 MG tablet    Sig: Take 1 tablet (50 mg total) by mouth 2 (two) times daily.    Dispense:  180 tablet    Refill:  3   metoprolol succinate (TOPROL-XL) 25 MG 24 hr tablet    Sig: Take 1 tablet (25 mg total) by mouth daily.    Dispense:  90 tablet    Refill:  3   rosuvastatin (CRESTOR) 20 MG tablet    Sig: Take 1 tablet (20 mg total) by mouth daily.    Dispense:  90 tablet    Refill:  3   nitroGLYCERIN (NITROSTAT) 0.4 MG SL tablet    Sig: Place 1 tablet (0.4 mg total) under the tongue every 5 (five) minutes as needed for chest pain.    Dispense:  25 tablet    Refill:  1    Follow Up:  Either In Person or Virtual 12 months  Signed, Sanda Klein, MD  03/22/2019 3:52 PM    [place]

## 2019-03-22 NOTE — Patient Instructions (Signed)
Medication Instructions:  No changes *If you need a refill on your cardiac medications before your next appointment, please call your pharmacy*  Lab Work: Your provider would like for you to have the following labs: fasting Lipid  If you have labs (blood work) drawn today and your tests are completely normal, you will receive your results only by: Marland Kitchen MyChart Message (if you have MyChart) OR . A paper copy in the mail If you have any lab test that is abnormal or we need to change your treatment, we will call you to review the results.  Testing/Procedures: None ordered  Follow-Up: At [facility], you and your health needs are our priority.  As part of our continuing mission to provide you with exceptional heart care, we have created designated Provider Care Teams.  These Care Teams include your primary Cardiologist (physician) and Advanced Practice Providers (APPs -  Physician Assistants and Nurse Practitioners) who all work together to provide you with the care you need, when you need it.  Your next appointment:   12 months  The format for your next appointment:   In Person  Provider:   Sanda Klein, MD

## 2019-03-23 DIAGNOSIS — R41841 Cognitive communication deficit: Secondary | ICD-10-CM | POA: Diagnosis not present

## 2019-03-23 DIAGNOSIS — M6281 Muscle weakness (generalized): Secondary | ICD-10-CM | POA: Diagnosis not present

## 2019-03-23 DIAGNOSIS — R278 Other lack of coordination: Secondary | ICD-10-CM | POA: Diagnosis not present

## 2019-03-23 DIAGNOSIS — R1312 Dysphagia, oropharyngeal phase: Secondary | ICD-10-CM | POA: Diagnosis not present

## 2019-03-23 DIAGNOSIS — M25552 Pain in left hip: Secondary | ICD-10-CM | POA: Diagnosis not present

## 2019-03-23 DIAGNOSIS — R2681 Unsteadiness on feet: Secondary | ICD-10-CM | POA: Diagnosis not present

## 2019-03-24 DIAGNOSIS — R41841 Cognitive communication deficit: Secondary | ICD-10-CM | POA: Diagnosis not present

## 2019-03-24 DIAGNOSIS — R1312 Dysphagia, oropharyngeal phase: Secondary | ICD-10-CM | POA: Diagnosis not present

## 2019-03-24 DIAGNOSIS — R2681 Unsteadiness on feet: Secondary | ICD-10-CM | POA: Diagnosis not present

## 2019-03-24 DIAGNOSIS — M6281 Muscle weakness (generalized): Secondary | ICD-10-CM | POA: Diagnosis not present

## 2019-03-24 DIAGNOSIS — R278 Other lack of coordination: Secondary | ICD-10-CM | POA: Diagnosis not present

## 2019-03-24 DIAGNOSIS — M25552 Pain in left hip: Secondary | ICD-10-CM | POA: Diagnosis not present

## 2019-03-27 DIAGNOSIS — M25552 Pain in left hip: Secondary | ICD-10-CM | POA: Diagnosis not present

## 2019-03-27 DIAGNOSIS — M6281 Muscle weakness (generalized): Secondary | ICD-10-CM | POA: Diagnosis not present

## 2019-03-27 DIAGNOSIS — R278 Other lack of coordination: Secondary | ICD-10-CM | POA: Diagnosis not present

## 2019-03-27 DIAGNOSIS — R2681 Unsteadiness on feet: Secondary | ICD-10-CM | POA: Diagnosis not present

## 2019-03-27 DIAGNOSIS — R262 Difficulty in walking, not elsewhere classified: Secondary | ICD-10-CM | POA: Diagnosis not present

## 2019-03-28 DIAGNOSIS — R2681 Unsteadiness on feet: Secondary | ICD-10-CM | POA: Diagnosis not present

## 2019-03-28 DIAGNOSIS — R278 Other lack of coordination: Secondary | ICD-10-CM | POA: Diagnosis not present

## 2019-03-28 DIAGNOSIS — R262 Difficulty in walking, not elsewhere classified: Secondary | ICD-10-CM | POA: Diagnosis not present

## 2019-03-28 DIAGNOSIS — M6281 Muscle weakness (generalized): Secondary | ICD-10-CM | POA: Diagnosis not present

## 2019-03-28 DIAGNOSIS — M25552 Pain in left hip: Secondary | ICD-10-CM | POA: Diagnosis not present

## 2019-03-29 DIAGNOSIS — M6281 Muscle weakness (generalized): Secondary | ICD-10-CM | POA: Diagnosis not present

## 2019-03-29 DIAGNOSIS — M25552 Pain in left hip: Secondary | ICD-10-CM | POA: Diagnosis not present

## 2019-03-29 DIAGNOSIS — R262 Difficulty in walking, not elsewhere classified: Secondary | ICD-10-CM | POA: Diagnosis not present

## 2019-03-29 DIAGNOSIS — R2681 Unsteadiness on feet: Secondary | ICD-10-CM | POA: Diagnosis not present

## 2019-03-29 DIAGNOSIS — Z1159 Encounter for screening for other viral diseases: Secondary | ICD-10-CM | POA: Diagnosis not present

## 2019-03-29 DIAGNOSIS — R278 Other lack of coordination: Secondary | ICD-10-CM | POA: Diagnosis not present

## 2019-03-30 DIAGNOSIS — R278 Other lack of coordination: Secondary | ICD-10-CM | POA: Diagnosis not present

## 2019-03-30 DIAGNOSIS — M6281 Muscle weakness (generalized): Secondary | ICD-10-CM | POA: Diagnosis not present

## 2019-03-30 DIAGNOSIS — R2681 Unsteadiness on feet: Secondary | ICD-10-CM | POA: Diagnosis not present

## 2019-03-30 DIAGNOSIS — M25552 Pain in left hip: Secondary | ICD-10-CM | POA: Diagnosis not present

## 2019-03-30 DIAGNOSIS — R262 Difficulty in walking, not elsewhere classified: Secondary | ICD-10-CM | POA: Diagnosis not present

## 2019-03-31 DIAGNOSIS — M25552 Pain in left hip: Secondary | ICD-10-CM | POA: Diagnosis not present

## 2019-03-31 DIAGNOSIS — M6281 Muscle weakness (generalized): Secondary | ICD-10-CM | POA: Diagnosis not present

## 2019-03-31 DIAGNOSIS — R2681 Unsteadiness on feet: Secondary | ICD-10-CM | POA: Diagnosis not present

## 2019-03-31 DIAGNOSIS — R262 Difficulty in walking, not elsewhere classified: Secondary | ICD-10-CM | POA: Diagnosis not present

## 2019-03-31 DIAGNOSIS — R278 Other lack of coordination: Secondary | ICD-10-CM | POA: Diagnosis not present

## 2019-04-02 ENCOUNTER — Other Ambulatory Visit: Payer: Self-pay | Admitting: Cardiovascular Disease

## 2019-04-03 DIAGNOSIS — M6281 Muscle weakness (generalized): Secondary | ICD-10-CM | POA: Diagnosis not present

## 2019-04-03 DIAGNOSIS — R262 Difficulty in walking, not elsewhere classified: Secondary | ICD-10-CM | POA: Diagnosis not present

## 2019-04-03 DIAGNOSIS — M25552 Pain in left hip: Secondary | ICD-10-CM | POA: Diagnosis not present

## 2019-04-03 DIAGNOSIS — R2681 Unsteadiness on feet: Secondary | ICD-10-CM | POA: Diagnosis not present

## 2019-04-03 DIAGNOSIS — R278 Other lack of coordination: Secondary | ICD-10-CM | POA: Diagnosis not present

## 2019-04-04 DIAGNOSIS — M6281 Muscle weakness (generalized): Secondary | ICD-10-CM | POA: Diagnosis not present

## 2019-04-04 DIAGNOSIS — R2681 Unsteadiness on feet: Secondary | ICD-10-CM | POA: Diagnosis not present

## 2019-04-04 DIAGNOSIS — R262 Difficulty in walking, not elsewhere classified: Secondary | ICD-10-CM | POA: Diagnosis not present

## 2019-04-04 DIAGNOSIS — R278 Other lack of coordination: Secondary | ICD-10-CM | POA: Diagnosis not present

## 2019-04-04 DIAGNOSIS — M25552 Pain in left hip: Secondary | ICD-10-CM | POA: Diagnosis not present

## 2019-04-05 DIAGNOSIS — M6281 Muscle weakness (generalized): Secondary | ICD-10-CM | POA: Diagnosis not present

## 2019-04-05 DIAGNOSIS — R262 Difficulty in walking, not elsewhere classified: Secondary | ICD-10-CM | POA: Diagnosis not present

## 2019-04-05 DIAGNOSIS — M25552 Pain in left hip: Secondary | ICD-10-CM | POA: Diagnosis not present

## 2019-04-05 DIAGNOSIS — R278 Other lack of coordination: Secondary | ICD-10-CM | POA: Diagnosis not present

## 2019-04-05 DIAGNOSIS — R2681 Unsteadiness on feet: Secondary | ICD-10-CM | POA: Diagnosis not present

## 2019-04-06 DIAGNOSIS — R278 Other lack of coordination: Secondary | ICD-10-CM | POA: Diagnosis not present

## 2019-04-06 DIAGNOSIS — M25552 Pain in left hip: Secondary | ICD-10-CM | POA: Diagnosis not present

## 2019-04-06 DIAGNOSIS — R2681 Unsteadiness on feet: Secondary | ICD-10-CM | POA: Diagnosis not present

## 2019-04-06 DIAGNOSIS — R262 Difficulty in walking, not elsewhere classified: Secondary | ICD-10-CM | POA: Diagnosis not present

## 2019-04-06 DIAGNOSIS — M6281 Muscle weakness (generalized): Secondary | ICD-10-CM | POA: Diagnosis not present

## 2019-04-07 DIAGNOSIS — M6281 Muscle weakness (generalized): Secondary | ICD-10-CM | POA: Diagnosis not present

## 2019-04-07 DIAGNOSIS — M25552 Pain in left hip: Secondary | ICD-10-CM | POA: Diagnosis not present

## 2019-04-07 DIAGNOSIS — R262 Difficulty in walking, not elsewhere classified: Secondary | ICD-10-CM | POA: Diagnosis not present

## 2019-04-07 DIAGNOSIS — R278 Other lack of coordination: Secondary | ICD-10-CM | POA: Diagnosis not present

## 2019-04-07 DIAGNOSIS — R2681 Unsteadiness on feet: Secondary | ICD-10-CM | POA: Diagnosis not present

## 2019-04-10 DIAGNOSIS — M25552 Pain in left hip: Secondary | ICD-10-CM | POA: Diagnosis not present

## 2019-04-10 DIAGNOSIS — R278 Other lack of coordination: Secondary | ICD-10-CM | POA: Diagnosis not present

## 2019-04-10 DIAGNOSIS — R2681 Unsteadiness on feet: Secondary | ICD-10-CM | POA: Diagnosis not present

## 2019-04-10 DIAGNOSIS — R262 Difficulty in walking, not elsewhere classified: Secondary | ICD-10-CM | POA: Diagnosis not present

## 2019-04-10 DIAGNOSIS — M6281 Muscle weakness (generalized): Secondary | ICD-10-CM | POA: Diagnosis not present

## 2019-04-11 DIAGNOSIS — M6281 Muscle weakness (generalized): Secondary | ICD-10-CM | POA: Diagnosis not present

## 2019-04-11 DIAGNOSIS — M25552 Pain in left hip: Secondary | ICD-10-CM | POA: Diagnosis not present

## 2019-04-11 DIAGNOSIS — R300 Dysuria: Secondary | ICD-10-CM | POA: Diagnosis not present

## 2019-04-11 DIAGNOSIS — R2681 Unsteadiness on feet: Secondary | ICD-10-CM | POA: Diagnosis not present

## 2019-04-11 DIAGNOSIS — R262 Difficulty in walking, not elsewhere classified: Secondary | ICD-10-CM | POA: Diagnosis not present

## 2019-04-11 DIAGNOSIS — R278 Other lack of coordination: Secondary | ICD-10-CM | POA: Diagnosis not present

## 2019-04-12 DIAGNOSIS — M6281 Muscle weakness (generalized): Secondary | ICD-10-CM | POA: Diagnosis not present

## 2019-04-12 DIAGNOSIS — R2681 Unsteadiness on feet: Secondary | ICD-10-CM | POA: Diagnosis not present

## 2019-04-12 DIAGNOSIS — M25552 Pain in left hip: Secondary | ICD-10-CM | POA: Diagnosis not present

## 2019-04-12 DIAGNOSIS — R262 Difficulty in walking, not elsewhere classified: Secondary | ICD-10-CM | POA: Diagnosis not present

## 2019-04-12 DIAGNOSIS — R278 Other lack of coordination: Secondary | ICD-10-CM | POA: Diagnosis not present

## 2019-04-13 DIAGNOSIS — R278 Other lack of coordination: Secondary | ICD-10-CM | POA: Diagnosis not present

## 2019-04-13 DIAGNOSIS — R2681 Unsteadiness on feet: Secondary | ICD-10-CM | POA: Diagnosis not present

## 2019-04-13 DIAGNOSIS — M25552 Pain in left hip: Secondary | ICD-10-CM | POA: Diagnosis not present

## 2019-04-13 DIAGNOSIS — M6281 Muscle weakness (generalized): Secondary | ICD-10-CM | POA: Diagnosis not present

## 2019-04-13 DIAGNOSIS — R262 Difficulty in walking, not elsewhere classified: Secondary | ICD-10-CM | POA: Diagnosis not present

## 2019-04-14 DIAGNOSIS — R2681 Unsteadiness on feet: Secondary | ICD-10-CM | POA: Diagnosis not present

## 2019-04-14 DIAGNOSIS — R278 Other lack of coordination: Secondary | ICD-10-CM | POA: Diagnosis not present

## 2019-04-14 DIAGNOSIS — M25552 Pain in left hip: Secondary | ICD-10-CM | POA: Diagnosis not present

## 2019-04-14 DIAGNOSIS — R262 Difficulty in walking, not elsewhere classified: Secondary | ICD-10-CM | POA: Diagnosis not present

## 2019-04-14 DIAGNOSIS — M6281 Muscle weakness (generalized): Secondary | ICD-10-CM | POA: Diagnosis not present

## 2019-04-16 DIAGNOSIS — R278 Other lack of coordination: Secondary | ICD-10-CM | POA: Diagnosis not present

## 2019-04-16 DIAGNOSIS — R262 Difficulty in walking, not elsewhere classified: Secondary | ICD-10-CM | POA: Diagnosis not present

## 2019-04-16 DIAGNOSIS — M6281 Muscle weakness (generalized): Secondary | ICD-10-CM | POA: Diagnosis not present

## 2019-04-16 DIAGNOSIS — R2681 Unsteadiness on feet: Secondary | ICD-10-CM | POA: Diagnosis not present

## 2019-04-16 DIAGNOSIS — M25552 Pain in left hip: Secondary | ICD-10-CM | POA: Diagnosis not present

## 2019-04-17 DIAGNOSIS — R278 Other lack of coordination: Secondary | ICD-10-CM | POA: Diagnosis not present

## 2019-04-17 DIAGNOSIS — R262 Difficulty in walking, not elsewhere classified: Secondary | ICD-10-CM | POA: Diagnosis not present

## 2019-04-17 DIAGNOSIS — M25552 Pain in left hip: Secondary | ICD-10-CM | POA: Diagnosis not present

## 2019-04-17 DIAGNOSIS — R2681 Unsteadiness on feet: Secondary | ICD-10-CM | POA: Diagnosis not present

## 2019-04-17 DIAGNOSIS — M6281 Muscle weakness (generalized): Secondary | ICD-10-CM | POA: Diagnosis not present

## 2019-04-18 DIAGNOSIS — R278 Other lack of coordination: Secondary | ICD-10-CM | POA: Diagnosis not present

## 2019-04-18 DIAGNOSIS — M6281 Muscle weakness (generalized): Secondary | ICD-10-CM | POA: Diagnosis not present

## 2019-04-18 DIAGNOSIS — R262 Difficulty in walking, not elsewhere classified: Secondary | ICD-10-CM | POA: Diagnosis not present

## 2019-04-18 DIAGNOSIS — M25552 Pain in left hip: Secondary | ICD-10-CM | POA: Diagnosis not present

## 2019-04-18 DIAGNOSIS — R2681 Unsteadiness on feet: Secondary | ICD-10-CM | POA: Diagnosis not present

## 2019-04-19 DIAGNOSIS — R278 Other lack of coordination: Secondary | ICD-10-CM | POA: Diagnosis not present

## 2019-04-19 DIAGNOSIS — M25552 Pain in left hip: Secondary | ICD-10-CM | POA: Diagnosis not present

## 2019-04-19 DIAGNOSIS — R262 Difficulty in walking, not elsewhere classified: Secondary | ICD-10-CM | POA: Diagnosis not present

## 2019-04-19 DIAGNOSIS — R2681 Unsteadiness on feet: Secondary | ICD-10-CM | POA: Diagnosis not present

## 2019-04-19 DIAGNOSIS — M6281 Muscle weakness (generalized): Secondary | ICD-10-CM | POA: Diagnosis not present

## 2019-04-24 DIAGNOSIS — R278 Other lack of coordination: Secondary | ICD-10-CM | POA: Diagnosis not present

## 2019-04-24 DIAGNOSIS — M6281 Muscle weakness (generalized): Secondary | ICD-10-CM | POA: Diagnosis not present

## 2019-04-24 DIAGNOSIS — R262 Difficulty in walking, not elsewhere classified: Secondary | ICD-10-CM | POA: Diagnosis not present

## 2019-04-24 DIAGNOSIS — R2681 Unsteadiness on feet: Secondary | ICD-10-CM | POA: Diagnosis not present

## 2019-04-24 DIAGNOSIS — M25552 Pain in left hip: Secondary | ICD-10-CM | POA: Diagnosis not present

## 2019-05-10 ENCOUNTER — Other Ambulatory Visit: Payer: Self-pay | Admitting: Cardiovascular Disease

## 2019-05-14 ENCOUNTER — Other Ambulatory Visit: Payer: Self-pay | Admitting: Cardiovascular Disease

## 2019-05-15 NOTE — Telephone Encounter (Signed)
Rx(s) sent to pharmacy electronically.  

## 2019-05-22 ENCOUNTER — Encounter: Payer: Self-pay | Admitting: *Deleted

## 2019-05-26 DIAGNOSIS — R278 Other lack of coordination: Secondary | ICD-10-CM | POA: Diagnosis not present

## 2019-05-26 DIAGNOSIS — R2681 Unsteadiness on feet: Secondary | ICD-10-CM | POA: Diagnosis not present

## 2019-05-26 DIAGNOSIS — R262 Difficulty in walking, not elsewhere classified: Secondary | ICD-10-CM | POA: Diagnosis not present

## 2019-05-26 DIAGNOSIS — M25552 Pain in left hip: Secondary | ICD-10-CM | POA: Diagnosis not present

## 2019-05-26 DIAGNOSIS — M6281 Muscle weakness (generalized): Secondary | ICD-10-CM | POA: Diagnosis not present

## 2019-05-29 DIAGNOSIS — M25552 Pain in left hip: Secondary | ICD-10-CM | POA: Diagnosis not present

## 2019-05-29 DIAGNOSIS — R2681 Unsteadiness on feet: Secondary | ICD-10-CM | POA: Diagnosis not present

## 2019-05-29 DIAGNOSIS — R278 Other lack of coordination: Secondary | ICD-10-CM | POA: Diagnosis not present

## 2019-05-29 DIAGNOSIS — M6281 Muscle weakness (generalized): Secondary | ICD-10-CM | POA: Diagnosis not present

## 2019-05-29 DIAGNOSIS — R262 Difficulty in walking, not elsewhere classified: Secondary | ICD-10-CM | POA: Diagnosis not present

## 2019-05-31 DIAGNOSIS — M6281 Muscle weakness (generalized): Secondary | ICD-10-CM | POA: Diagnosis not present

## 2019-05-31 DIAGNOSIS — M25552 Pain in left hip: Secondary | ICD-10-CM | POA: Diagnosis not present

## 2019-05-31 DIAGNOSIS — R262 Difficulty in walking, not elsewhere classified: Secondary | ICD-10-CM | POA: Diagnosis not present

## 2019-05-31 DIAGNOSIS — R2681 Unsteadiness on feet: Secondary | ICD-10-CM | POA: Diagnosis not present

## 2019-05-31 DIAGNOSIS — R278 Other lack of coordination: Secondary | ICD-10-CM | POA: Diagnosis not present

## 2019-06-02 DIAGNOSIS — M25552 Pain in left hip: Secondary | ICD-10-CM | POA: Diagnosis not present

## 2019-06-02 DIAGNOSIS — R262 Difficulty in walking, not elsewhere classified: Secondary | ICD-10-CM | POA: Diagnosis not present

## 2019-06-02 DIAGNOSIS — R2681 Unsteadiness on feet: Secondary | ICD-10-CM | POA: Diagnosis not present

## 2019-06-02 DIAGNOSIS — M6281 Muscle weakness (generalized): Secondary | ICD-10-CM | POA: Diagnosis not present

## 2019-06-02 DIAGNOSIS — R278 Other lack of coordination: Secondary | ICD-10-CM | POA: Diagnosis not present

## 2019-06-05 DIAGNOSIS — M6281 Muscle weakness (generalized): Secondary | ICD-10-CM | POA: Diagnosis not present

## 2019-06-05 DIAGNOSIS — R262 Difficulty in walking, not elsewhere classified: Secondary | ICD-10-CM | POA: Diagnosis not present

## 2019-06-05 DIAGNOSIS — M25552 Pain in left hip: Secondary | ICD-10-CM | POA: Diagnosis not present

## 2019-06-05 DIAGNOSIS — R278 Other lack of coordination: Secondary | ICD-10-CM | POA: Diagnosis not present

## 2019-06-05 DIAGNOSIS — R2681 Unsteadiness on feet: Secondary | ICD-10-CM | POA: Diagnosis not present

## 2019-06-06 DIAGNOSIS — R278 Other lack of coordination: Secondary | ICD-10-CM | POA: Diagnosis not present

## 2019-06-06 DIAGNOSIS — R262 Difficulty in walking, not elsewhere classified: Secondary | ICD-10-CM | POA: Diagnosis not present

## 2019-06-06 DIAGNOSIS — M6281 Muscle weakness (generalized): Secondary | ICD-10-CM | POA: Diagnosis not present

## 2019-06-06 DIAGNOSIS — R2681 Unsteadiness on feet: Secondary | ICD-10-CM | POA: Diagnosis not present

## 2019-06-06 DIAGNOSIS — M25552 Pain in left hip: Secondary | ICD-10-CM | POA: Diagnosis not present

## 2019-06-07 DIAGNOSIS — R262 Difficulty in walking, not elsewhere classified: Secondary | ICD-10-CM | POA: Diagnosis not present

## 2019-06-07 DIAGNOSIS — M25552 Pain in left hip: Secondary | ICD-10-CM | POA: Diagnosis not present

## 2019-06-07 DIAGNOSIS — R2681 Unsteadiness on feet: Secondary | ICD-10-CM | POA: Diagnosis not present

## 2019-06-07 DIAGNOSIS — M6281 Muscle weakness (generalized): Secondary | ICD-10-CM | POA: Diagnosis not present

## 2019-06-07 DIAGNOSIS — R278 Other lack of coordination: Secondary | ICD-10-CM | POA: Diagnosis not present

## 2019-06-12 DIAGNOSIS — M25552 Pain in left hip: Secondary | ICD-10-CM | POA: Diagnosis not present

## 2019-06-12 DIAGNOSIS — M6281 Muscle weakness (generalized): Secondary | ICD-10-CM | POA: Diagnosis not present

## 2019-06-12 DIAGNOSIS — R2681 Unsteadiness on feet: Secondary | ICD-10-CM | POA: Diagnosis not present

## 2019-06-12 DIAGNOSIS — R262 Difficulty in walking, not elsewhere classified: Secondary | ICD-10-CM | POA: Diagnosis not present

## 2019-06-13 DIAGNOSIS — R2681 Unsteadiness on feet: Secondary | ICD-10-CM | POA: Diagnosis not present

## 2019-06-13 DIAGNOSIS — R300 Dysuria: Secondary | ICD-10-CM | POA: Diagnosis not present

## 2019-06-13 DIAGNOSIS — M6281 Muscle weakness (generalized): Secondary | ICD-10-CM | POA: Diagnosis not present

## 2019-06-13 DIAGNOSIS — R262 Difficulty in walking, not elsewhere classified: Secondary | ICD-10-CM | POA: Diagnosis not present

## 2019-06-13 DIAGNOSIS — M25552 Pain in left hip: Secondary | ICD-10-CM | POA: Diagnosis not present

## 2019-06-14 DIAGNOSIS — R262 Difficulty in walking, not elsewhere classified: Secondary | ICD-10-CM | POA: Diagnosis not present

## 2019-06-14 DIAGNOSIS — M6281 Muscle weakness (generalized): Secondary | ICD-10-CM | POA: Diagnosis not present

## 2019-06-14 DIAGNOSIS — M25552 Pain in left hip: Secondary | ICD-10-CM | POA: Diagnosis not present

## 2019-06-14 DIAGNOSIS — R2681 Unsteadiness on feet: Secondary | ICD-10-CM | POA: Diagnosis not present

## 2019-06-15 DIAGNOSIS — R262 Difficulty in walking, not elsewhere classified: Secondary | ICD-10-CM | POA: Diagnosis not present

## 2019-06-15 DIAGNOSIS — R2681 Unsteadiness on feet: Secondary | ICD-10-CM | POA: Diagnosis not present

## 2019-06-15 DIAGNOSIS — M25552 Pain in left hip: Secondary | ICD-10-CM | POA: Diagnosis not present

## 2019-06-15 DIAGNOSIS — M6281 Muscle weakness (generalized): Secondary | ICD-10-CM | POA: Diagnosis not present

## 2019-06-16 DIAGNOSIS — M6281 Muscle weakness (generalized): Secondary | ICD-10-CM | POA: Diagnosis not present

## 2019-06-16 DIAGNOSIS — R2681 Unsteadiness on feet: Secondary | ICD-10-CM | POA: Diagnosis not present

## 2019-06-16 DIAGNOSIS — R262 Difficulty in walking, not elsewhere classified: Secondary | ICD-10-CM | POA: Diagnosis not present

## 2019-06-16 DIAGNOSIS — M25552 Pain in left hip: Secondary | ICD-10-CM | POA: Diagnosis not present

## 2019-06-19 DIAGNOSIS — R2681 Unsteadiness on feet: Secondary | ICD-10-CM | POA: Diagnosis not present

## 2019-06-19 DIAGNOSIS — R262 Difficulty in walking, not elsewhere classified: Secondary | ICD-10-CM | POA: Diagnosis not present

## 2019-06-19 DIAGNOSIS — M25552 Pain in left hip: Secondary | ICD-10-CM | POA: Diagnosis not present

## 2019-06-19 DIAGNOSIS — M6281 Muscle weakness (generalized): Secondary | ICD-10-CM | POA: Diagnosis not present

## 2019-06-20 DIAGNOSIS — R262 Difficulty in walking, not elsewhere classified: Secondary | ICD-10-CM | POA: Diagnosis not present

## 2019-06-20 DIAGNOSIS — M25552 Pain in left hip: Secondary | ICD-10-CM | POA: Diagnosis not present

## 2019-06-20 DIAGNOSIS — M6281 Muscle weakness (generalized): Secondary | ICD-10-CM | POA: Diagnosis not present

## 2019-06-20 DIAGNOSIS — R2681 Unsteadiness on feet: Secondary | ICD-10-CM | POA: Diagnosis not present

## 2019-06-21 DIAGNOSIS — R262 Difficulty in walking, not elsewhere classified: Secondary | ICD-10-CM | POA: Diagnosis not present

## 2019-06-21 DIAGNOSIS — R2681 Unsteadiness on feet: Secondary | ICD-10-CM | POA: Diagnosis not present

## 2019-06-21 DIAGNOSIS — M25552 Pain in left hip: Secondary | ICD-10-CM | POA: Diagnosis not present

## 2019-06-21 DIAGNOSIS — M6281 Muscle weakness (generalized): Secondary | ICD-10-CM | POA: Diagnosis not present

## 2019-06-22 DIAGNOSIS — R262 Difficulty in walking, not elsewhere classified: Secondary | ICD-10-CM | POA: Diagnosis not present

## 2019-06-22 DIAGNOSIS — M25552 Pain in left hip: Secondary | ICD-10-CM | POA: Diagnosis not present

## 2019-06-22 DIAGNOSIS — M6281 Muscle weakness (generalized): Secondary | ICD-10-CM | POA: Diagnosis not present

## 2019-06-22 DIAGNOSIS — R2681 Unsteadiness on feet: Secondary | ICD-10-CM | POA: Diagnosis not present

## 2019-06-23 DIAGNOSIS — M6281 Muscle weakness (generalized): Secondary | ICD-10-CM | POA: Diagnosis not present

## 2019-06-23 DIAGNOSIS — R2681 Unsteadiness on feet: Secondary | ICD-10-CM | POA: Diagnosis not present

## 2019-06-23 DIAGNOSIS — M25552 Pain in left hip: Secondary | ICD-10-CM | POA: Diagnosis not present

## 2019-06-23 DIAGNOSIS — R262 Difficulty in walking, not elsewhere classified: Secondary | ICD-10-CM | POA: Diagnosis not present

## 2019-06-26 DIAGNOSIS — R262 Difficulty in walking, not elsewhere classified: Secondary | ICD-10-CM | POA: Diagnosis not present

## 2019-06-26 DIAGNOSIS — R2681 Unsteadiness on feet: Secondary | ICD-10-CM | POA: Diagnosis not present

## 2019-06-26 DIAGNOSIS — M25552 Pain in left hip: Secondary | ICD-10-CM | POA: Diagnosis not present

## 2019-06-26 DIAGNOSIS — M6281 Muscle weakness (generalized): Secondary | ICD-10-CM | POA: Diagnosis not present

## 2019-06-27 ENCOUNTER — Other Ambulatory Visit: Payer: Self-pay | Admitting: Cardiovascular Disease

## 2019-06-30 DIAGNOSIS — M25552 Pain in left hip: Secondary | ICD-10-CM | POA: Diagnosis not present

## 2019-06-30 DIAGNOSIS — M6281 Muscle weakness (generalized): Secondary | ICD-10-CM | POA: Diagnosis not present

## 2019-06-30 DIAGNOSIS — R2681 Unsteadiness on feet: Secondary | ICD-10-CM | POA: Diagnosis not present

## 2019-06-30 DIAGNOSIS — R262 Difficulty in walking, not elsewhere classified: Secondary | ICD-10-CM | POA: Diagnosis not present

## 2019-07-03 DIAGNOSIS — R2681 Unsteadiness on feet: Secondary | ICD-10-CM | POA: Diagnosis not present

## 2019-07-03 DIAGNOSIS — R262 Difficulty in walking, not elsewhere classified: Secondary | ICD-10-CM | POA: Diagnosis not present

## 2019-07-03 DIAGNOSIS — M25552 Pain in left hip: Secondary | ICD-10-CM | POA: Diagnosis not present

## 2019-07-03 DIAGNOSIS — M6281 Muscle weakness (generalized): Secondary | ICD-10-CM | POA: Diagnosis not present

## 2019-07-04 DIAGNOSIS — R262 Difficulty in walking, not elsewhere classified: Secondary | ICD-10-CM | POA: Diagnosis not present

## 2019-07-04 DIAGNOSIS — M25552 Pain in left hip: Secondary | ICD-10-CM | POA: Diagnosis not present

## 2019-07-04 DIAGNOSIS — M6281 Muscle weakness (generalized): Secondary | ICD-10-CM | POA: Diagnosis not present

## 2019-07-04 DIAGNOSIS — R2681 Unsteadiness on feet: Secondary | ICD-10-CM | POA: Diagnosis not present

## 2019-07-05 ENCOUNTER — Emergency Department (HOSPITAL_COMMUNITY): Payer: Medicare Other

## 2019-07-05 ENCOUNTER — Other Ambulatory Visit: Payer: Self-pay

## 2019-07-05 ENCOUNTER — Encounter (HOSPITAL_COMMUNITY): Payer: Self-pay | Admitting: Emergency Medicine

## 2019-07-05 ENCOUNTER — Inpatient Hospital Stay (HOSPITAL_COMMUNITY)
Admission: EM | Admit: 2019-07-05 | Discharge: 2019-07-09 | DRG: 689 | Disposition: A | Discharge status: Home / Self Care | Payer: Medicare Other | Attending: Internal Medicine | Admitting: Internal Medicine

## 2019-07-05 DIAGNOSIS — Z7982 Long term (current) use of aspirin: Secondary | ICD-10-CM

## 2019-07-05 DIAGNOSIS — I10 Essential (primary) hypertension: Secondary | ICD-10-CM | POA: Diagnosis not present

## 2019-07-05 DIAGNOSIS — N390 Urinary tract infection, site not specified: Principal | ICD-10-CM

## 2019-07-05 DIAGNOSIS — S199XXA Unspecified injury of neck, initial encounter: Secondary | ICD-10-CM | POA: Diagnosis not present

## 2019-07-05 DIAGNOSIS — J9601 Acute respiratory failure with hypoxia: Secondary | ICD-10-CM | POA: Diagnosis not present

## 2019-07-05 DIAGNOSIS — S8262XA Displaced fracture of lateral malleolus of left fibula, initial encounter for closed fracture: Secondary | ICD-10-CM | POA: Diagnosis not present

## 2019-07-05 DIAGNOSIS — I25810 Atherosclerosis of coronary artery bypass graft(s) without angina pectoris: Secondary | ICD-10-CM | POA: Diagnosis present

## 2019-07-05 DIAGNOSIS — M25552 Pain in left hip: Secondary | ICD-10-CM | POA: Diagnosis present

## 2019-07-05 DIAGNOSIS — R4182 Altered mental status, unspecified: Secondary | ICD-10-CM

## 2019-07-05 DIAGNOSIS — G92 Toxic encephalopathy: Secondary | ICD-10-CM | POA: Diagnosis present

## 2019-07-05 DIAGNOSIS — Z03818 Encounter for observation for suspected exposure to other biological agents ruled out: Secondary | ICD-10-CM | POA: Diagnosis not present

## 2019-07-05 DIAGNOSIS — J849 Interstitial pulmonary disease, unspecified: Secondary | ICD-10-CM | POA: Diagnosis present

## 2019-07-05 DIAGNOSIS — E875 Hyperkalemia: Secondary | ICD-10-CM

## 2019-07-05 DIAGNOSIS — Z79890 Hormone replacement therapy (postmenopausal): Secondary | ICD-10-CM

## 2019-07-05 DIAGNOSIS — Z8673 Personal history of transient ischemic attack (TIA), and cerebral infarction without residual deficits: Secondary | ICD-10-CM

## 2019-07-05 DIAGNOSIS — Z7902 Long term (current) use of antithrombotics/antiplatelets: Secondary | ICD-10-CM

## 2019-07-05 DIAGNOSIS — D649 Anemia, unspecified: Secondary | ICD-10-CM | POA: Diagnosis present

## 2019-07-05 DIAGNOSIS — Z7983 Long term (current) use of bisphosphonates: Secondary | ICD-10-CM

## 2019-07-05 DIAGNOSIS — Z20822 Contact with and (suspected) exposure to covid-19: Secondary | ICD-10-CM | POA: Diagnosis not present

## 2019-07-05 DIAGNOSIS — S79912A Unspecified injury of left hip, initial encounter: Secondary | ICD-10-CM | POA: Diagnosis not present

## 2019-07-05 DIAGNOSIS — Z66 Do not resuscitate: Secondary | ICD-10-CM | POA: Diagnosis not present

## 2019-07-05 DIAGNOSIS — R410 Disorientation, unspecified: Secondary | ICD-10-CM | POA: Diagnosis not present

## 2019-07-05 DIAGNOSIS — R609 Edema, unspecified: Secondary | ICD-10-CM | POA: Diagnosis not present

## 2019-07-05 DIAGNOSIS — I77810 Thoracic aortic ectasia: Secondary | ICD-10-CM | POA: Diagnosis present

## 2019-07-05 DIAGNOSIS — S0990XA Unspecified injury of head, initial encounter: Secondary | ICD-10-CM | POA: Diagnosis not present

## 2019-07-05 DIAGNOSIS — M79605 Pain in left leg: Secondary | ICD-10-CM | POA: Diagnosis not present

## 2019-07-05 DIAGNOSIS — I110 Hypertensive heart disease with heart failure: Secondary | ICD-10-CM | POA: Diagnosis present

## 2019-07-05 DIAGNOSIS — R2681 Unsteadiness on feet: Secondary | ICD-10-CM | POA: Diagnosis not present

## 2019-07-05 DIAGNOSIS — S82839A Other fracture of upper and lower end of unspecified fibula, initial encounter for closed fracture: Secondary | ICD-10-CM

## 2019-07-05 DIAGNOSIS — B952 Enterococcus as the cause of diseases classified elsewhere: Secondary | ICD-10-CM | POA: Diagnosis present

## 2019-07-05 DIAGNOSIS — E039 Hypothyroidism, unspecified: Secondary | ICD-10-CM | POA: Diagnosis present

## 2019-07-05 DIAGNOSIS — E785 Hyperlipidemia, unspecified: Secondary | ICD-10-CM | POA: Diagnosis present

## 2019-07-05 DIAGNOSIS — R52 Pain, unspecified: Secondary | ICD-10-CM | POA: Diagnosis not present

## 2019-07-05 DIAGNOSIS — Z87440 Personal history of urinary (tract) infections: Secondary | ICD-10-CM

## 2019-07-05 DIAGNOSIS — Z8249 Family history of ischemic heart disease and other diseases of the circulatory system: Secondary | ICD-10-CM

## 2019-07-05 DIAGNOSIS — K219 Gastro-esophageal reflux disease without esophagitis: Secondary | ICD-10-CM | POA: Diagnosis present

## 2019-07-05 DIAGNOSIS — I2510 Atherosclerotic heart disease of native coronary artery without angina pectoris: Secondary | ICD-10-CM | POA: Diagnosis present

## 2019-07-05 DIAGNOSIS — S8263XA Displaced fracture of lateral malleolus of unspecified fibula, initial encounter for closed fracture: Secondary | ICD-10-CM

## 2019-07-05 DIAGNOSIS — R0602 Shortness of breath: Secondary | ICD-10-CM | POA: Diagnosis not present

## 2019-07-05 DIAGNOSIS — R262 Difficulty in walking, not elsewhere classified: Secondary | ICD-10-CM | POA: Diagnosis not present

## 2019-07-05 DIAGNOSIS — Z792 Long term (current) use of antibiotics: Secondary | ICD-10-CM

## 2019-07-05 DIAGNOSIS — Z87442 Personal history of urinary calculi: Secondary | ICD-10-CM

## 2019-07-05 DIAGNOSIS — W19XXXA Unspecified fall, initial encounter: Secondary | ICD-10-CM | POA: Diagnosis present

## 2019-07-05 DIAGNOSIS — Z96642 Presence of left artificial hip joint: Secondary | ICD-10-CM | POA: Diagnosis present

## 2019-07-05 DIAGNOSIS — S82832A Other fracture of upper and lower end of left fibula, initial encounter for closed fracture: Secondary | ICD-10-CM | POA: Diagnosis present

## 2019-07-05 DIAGNOSIS — Z79899 Other long term (current) drug therapy: Secondary | ICD-10-CM

## 2019-07-05 DIAGNOSIS — Z1611 Resistance to penicillins: Secondary | ICD-10-CM | POA: Diagnosis present

## 2019-07-05 DIAGNOSIS — S82402A Unspecified fracture of shaft of left fibula, initial encounter for closed fracture: Secondary | ICD-10-CM

## 2019-07-05 DIAGNOSIS — F411 Generalized anxiety disorder: Secondary | ICD-10-CM | POA: Diagnosis present

## 2019-07-05 DIAGNOSIS — I5032 Chronic diastolic (congestive) heart failure: Secondary | ICD-10-CM | POA: Diagnosis present

## 2019-07-05 DIAGNOSIS — M6281 Muscle weakness (generalized): Secondary | ICD-10-CM | POA: Diagnosis not present

## 2019-07-05 DIAGNOSIS — W1830XA Fall on same level, unspecified, initial encounter: Secondary | ICD-10-CM | POA: Diagnosis present

## 2019-07-05 DIAGNOSIS — F329 Major depressive disorder, single episode, unspecified: Secondary | ICD-10-CM | POA: Diagnosis present

## 2019-07-05 DIAGNOSIS — S299XXA Unspecified injury of thorax, initial encounter: Secondary | ICD-10-CM | POA: Diagnosis not present

## 2019-07-05 DIAGNOSIS — Z951 Presence of aortocoronary bypass graft: Secondary | ICD-10-CM

## 2019-07-05 LAB — BASIC METABOLIC PANEL
Anion gap: 11 (ref 5–15)
BUN: 14 mg/dL (ref 8–23)
CO2: 27 mmol/L (ref 22–32)
Calcium: 9 mg/dL (ref 8.9–10.3)
Chloride: 98 mmol/L (ref 98–111)
Creatinine, Ser: 0.7 mg/dL (ref 0.44–1.00)
GFR calc Af Amer: >60 mL/min (ref >60)
GFR calc non Af Amer: >60 mL/min (ref >60)
Glucose, Bld: 114 mg/dL — ABNORMAL HIGH (ref 70–99)
Potassium: 6.2 mmol/L — ABNORMAL HIGH (ref 3.5–5.1)
Sodium: 136 mmol/L (ref 135–145)

## 2019-07-05 LAB — CBC WITH DIFFERENTIAL/PLATELET
Abs Immature Granulocytes: 0.06 10*3/uL (ref 0.00–0.07)
Basophils Absolute: 0 10*3/uL (ref 0.0–0.1)
Basophils Relative: 0 %
Eosinophils Absolute: 0.3 10*3/uL (ref 0.0–0.5)
Eosinophils Relative: 2 %
HCT: 36.3 % (ref 36.0–46.0)
Hemoglobin: 11.8 g/dL — ABNORMAL LOW (ref 12.0–15.0)
Immature Granulocytes: 0 %
Lymphocytes Relative: 14 %
Lymphs Abs: 1.8 10*3/uL (ref 0.7–4.0)
MCH: 32.1 pg (ref 26.0–34.0)
MCHC: 32.5 g/dL (ref 30.0–36.0)
MCV: 98.6 fL (ref 80.0–100.0)
Monocytes Absolute: 0.7 10*3/uL (ref 0.1–1.0)
Monocytes Relative: 5 %
Neutro Abs: 10.5 10*3/uL — ABNORMAL HIGH (ref 1.7–7.7)
Neutrophils Relative %: 79 %
Platelets: 270 10*3/uL (ref 150–400)
RBC: 3.68 MIL/uL — ABNORMAL LOW (ref 3.87–5.11)
RDW: 12.3 % (ref 11.5–15.5)
WBC: 13.4 10*3/uL — ABNORMAL HIGH (ref 4.0–10.5)
nRBC: 0 % (ref 0.0–0.2)

## 2019-07-05 LAB — RESPIRATORY PANEL BY RT PCR (FLU A&B, COVID)
Influenza A by PCR: NEGATIVE
Influenza B by PCR: NEGATIVE
SARS Coronavirus 2 by RT PCR: NEGATIVE

## 2019-07-05 LAB — BRAIN NATRIURETIC PEPTIDE: B Natriuretic Peptide: 106.4 pg/mL — ABNORMAL HIGH (ref 0.0–100.0)

## 2019-07-05 MED ORDER — IOHEXOL 350 MG/ML SOLN
100.0000 mL | Freq: Once | INTRAVENOUS | Status: AC | PRN
  Administered 2019-07-05: 100 mL via INTRAVENOUS

## 2019-07-05 MED ORDER — INSULIN ASPART 100 UNIT/ML IV SOLN
5.0000 [IU] | Freq: Once | INTRAVENOUS | Status: AC
  Administered 2019-07-05: 5 [IU] via INTRAVENOUS
  Filled 2019-07-05: qty 0.05

## 2019-07-05 MED ORDER — DEXTROSE 50 % IV SOLN
1.0000 | Freq: Once | INTRAVENOUS | Status: AC
  Administered 2019-07-05: 50 mL via INTRAVENOUS
  Filled 2019-07-05: qty 50

## 2019-07-05 MED ORDER — ALBUTEROL SULFATE (2.5 MG/3ML) 0.083% IN NEBU
5.0000 mg | INHALATION_SOLUTION | Freq: Once | RESPIRATORY_TRACT | Status: AC
  Administered 2019-07-05: 5 mg via RESPIRATORY_TRACT
  Filled 2019-07-05: qty 6

## 2019-07-05 NOTE — ED Provider Notes (Signed)
[facility] DEPT Provider Note   CSN: [id number] Arrival date & time: 07/05/19  [place]     History Chief Complaint  Patient presents with  . Knee Pain    left knee with associated swelling    NERIA THIBAUDEAU is a 86 y.o. female with past medical history significant for CAD s/p CABG, chronic diastolic heart failure, hypertension, hyperlipidemia, hypothyroidism, mild OSA not on CPAP, CVA on Plavix, left hip replacement presents to emergency department today via EMS with chief complaint of mechanical fall.  Onset was acute happening just prior to arrival.  Patient is a resident at [facility].  She lives in independent living but has a 24-hour caregiver.  Her granddaughter is at the bedside and is contributing historian.  She states the on duty caregiver called to let her know that the patient had fallen today.  Caregiver was able to safely lower her to the ground.  She does not think she hit her head denies loss of consciousness. Since the fall patient has been complaining of left knee and hip pain. Denies fever, chills, chest pain, shortness of breath, abdominal pain, nausea, vomiting, numbness, tingling, decrease sensation, urinary frequency or dysuria. Of note patient takes Keflex daily for chronic UTIs.  Her granddaughter states that she had a recent UTI  She was treated with levofloxacin and finished the antibiotics approximately x3 weeks ago. Patient is DNR, paperwork is at the bedside.    Past Medical History:  Diagnosis Date  . Arthritis   . Blood transfusion 2007   AFTER HIP REPLACEMENT  . Carotid bruit    PT'S DAUGHTER STATES PT HAD RECENT CAROTID STUDY AND WAS TOLD NO SIGNIFICANT BLOCKAGES  . Chronic diastolic CHF (congestive heart failure) ([place])    a. 07/2015 Ech: Ef 60-65%, Gr1 DD.  Marland Kitchen Coronary artery disease    a. 2009 CABG 4, Dr. Servando Snare (LIMA to LAD , SVG to diagonal, SVG to circumflex, SVG to PDA); b. Cath 2012 in  Recent ill,[place]  patent LIMA , Patent SVG to diagonal, occluded SVG to RCA. 5x52mm BMS->native RCA; c. 09/2015 Cath: LM nl, LAD 149m, D1 100, LCX small, OM1 90/small, OM2 70, RCA 45p, patent stent, VG->dRCA 100, VG->OM1 100, VG->Diag nl, LIMA->LAD nl->Med Rx.  . Depression   . Fracture FEB 2013   FRACTURED LEFT ANKLE--NO SURGERY--WAS IN BOOT UNTIL COUPLE WEEKS AGO  . GERD (gastroesophageal reflux disease)   . High cholesterol   . Hypothyroidism   . Ischemic colitis ([place]) 2010  . Kidney stones    IN THE PAST  . 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 [place] virus    COUPLE OF WEEKS AGO--ALL SYMTOMS RESOLVED PER PT  . Obstructive sleep apnea 2010  . Shortness of breath    AND WHEEZING AT TIMES--NO INHALERS  . Stroke ([place])    SEVERAL STROKES -TOLD SHE HAS CEREBELLUM BLOCKAGE AS RESULT OF STROKE--BUT NEUROLOGIST SAID HE DID NOT WANT TO DO ANY PROCEDURE BECAUSE OF HER AGE .  PT HAS SLIGHT LEFT FOOT DROP-DRAGS FOOT WHEN WALKING - USES WALKER  . Urinary incontinence   . UTI (lower urinary tract infection)    HX OF UTI'S-- LAST TIME COUPLE OF MONTHS AGO  . Vertigo     Patient Active Problem List   Diagnosis Date Noted  . Hip fracture due to osteoporosis, initial encounter ([place]) 01/03/2019  . Closed fracture of left hip ([place])   . Closed displaced fracture of greater  trochanter of left femur ([place]) 12/06/2018  . Pubic ramus fracture ([place]) 06/08/2017  . Acetabulum fracture ([place]) 06/08/2017  . Fall at home, initial encounter 06/08/2017  . UTI (urinary tract infection) 06/10/2016  . Acute pyelonephritis   . SIRS (systemic inflammatory response syndrome) (H[facility])   . Epistaxis 03/09/2016  . Ocular migraine 11/04/2015  . Essential hypertension 11/04/2015  . HLD (hyperlipidemia) 11/04/2015  . Coronary artery disease involving coronary bypass graft of native heart 11/04/2015  . Fatigue 10/08/2015  . Urinary tract infection 10/05/2015  . Chest pain 10/04/2015  . Hypertension 10/04/2015  .  Unstable angina ([place])   . Hypertensive heart disease without heart failure   . S/P CABG 2009-cath 10/07/15-medical Rx   . S/P coronary artery stent placement-2012   . Acute CVA (cerebrovascular accident) ([place]) 08/17/2015  . Possible Renovascular hypertension 07/25/2015  . Hyperlipidemia 07/25/2015  . History of stroke-March 2017 (rt brain) 07/25/2015  . Vertebrobasilar artery stenosis 07/25/2015  . CAD (coronary artery disease) of artery bypass graft 06/06/2014  . Diastolic dysfunction-grade 1 with EF 60% 06/06/2014  . CVA (cerebral vascular accident) ([place]) 11/17/2011  . TIA post cath 10/07/15 11/13/2011  . Weakness 11/13/2011  . Obstructive sleep apnea 11/23/2008  . PULMONARY NODULE 11/23/2008  . Hypoxemia 11/23/2008  . Hypothyroidism 11/22/2008  . Anxiety state 11/22/2008  . Cerebral artery occlusion with cerebral infarction ([place]) 11/22/2008    Past Surgical History:  Procedure Laterality Date  . ABDOMINAL HYSTERECTOMY    . APPENDECTOMY    . [place] AND 1990  . CARDIAC CATHETERIZATION N/A 10/07/2015   Procedure: Left Heart Cath and Cors/Grafts Angiography;  Surgeon: Peter M Martinique, MD;  Location: [facility];  Service: Cardiovascular;  Laterality: N/A;  . CATARACT EXTRACTION    . CORONARY ANGIOPLASTY  10/2010   WITH STENT PLACEMENT  . CORONARY ARTERY BYPASS GRAFT  2009  . CYSTOSCOPY WITH INJECTION  09/21/2011   Procedure: CYSTOSCOPY WITH INJECTION;  Surgeon: Ailene Rud, MD;  Location: [facility];  Service: Urology;  Laterality: N/A;  Peri Urethral Macroplastique Injection and Estring Placement  . HERNIA REPAIR    . HIP SURGERY    . JOINT REPLACEMENT  2007   HIP REPLACEMENT -LEFT  . TONSILLECTOMY       OB History   No obstetric history on file.     Family History  Problem Relation Age of Onset  . Heart disease Mother     Social History   Tobacco Use  . Smoking status: Never Smoker  . Smokeless tobacco: Never Used  Substance Use Topics   . Alcohol use: No  . Drug use: No    Home Medications Prior to Admission medications   Medication Sig Start Date End Date Taking? Authorizing Provider  acetaminophen (TYLENOL) 500 MG tablet Take 1,000 mg by mouth every 6 (six) hours as needed for mild pain or fever.    Yes [provider]  Albuterol Sulfate (PROAIR RESPICLICK) 123XX123 (90 Base) MCG/ACT AEPB Inhale 2 puffs into the lungs daily as needed (for shortness of breath).    Yes [provider]  alendronate (FOSAMAX) 70 MG tablet Take 1 tablet (70 mg total) by mouth once a week. Take with a full glass of water on an empty stomach.mon Patient taking differently: Take 70 mg by mouth every Monday. Take with a full glass of water on an empty stomach 10/08/15  Yes Ilean China  aspirin EC 81 MG tablet  Take 81 mg by mouth every evening.    Yes [provider]  cephALEXin (KEFLEX) 250 MG capsule Take 250 mg by mouth every evening.  07/04/19  Yes [provider]  clopidogrel (PLAVIX) 75 MG tablet Take 1 tablet (75 mg total) by mouth daily. 06/06/14  Yes Croitoru, Mihai, MD  Cranberry 500 MG CAPS Take 500 mg by mouth 2 (two) times a day.   Yes [provider]  D-Mannose 500 MG CAPS Take 500 mg by mouth 2 (two) times a day.   Yes [provider]  docusate sodium (COLACE) 100 MG capsule Take 100 mg by mouth 2 (two) times daily.    Yes [provider]  fish oil-omega-3 fatty acids 1000 MG capsule Take 1 g by mouth every evening.    Yes [provider]  isosorbide mononitrate (IMDUR) 30 MG 24 hr tablet Take 0.5 tablets (15 mg total) by mouth daily. 06/28/19 06/22/20 Yes Croitoru, Mihai, MD  levothyroxine (SYNTHROID, LEVOTHROID) 88 MCG tablet Take 1 tablet (88 mcg total) by mouth daily before breakfast. 06/13/17  Yes Rai, Ripudeep K, MD  losartan (COZAAR) 50 MG tablet Take 1 tablet (50 mg total) by mouth 2 (two) times daily. 03/22/19  Yes Croitoru, Mihai, MD  metoprolol succinate  (TOPROL-XL) 25 MG 24 hr tablet Take 1 tablet (25 mg total) by mouth daily. 03/22/19  Yes Croitoru, Mihai, MD  Multiple Vitamins-Minerals (ONE-A-DAY PROACTIVE 65+) TABS Take 1 tablet by mouth daily with breakfast.   Yes [provider]  nitroGLYCERIN (NITROSTAT) 0.4 MG SL tablet Place 1 tablet (0.4 mg total) under the tongue every 5 (five) minutes as needed for chest pain. 03/22/19  Yes Croitoru, Mihai, MD  NON FORMULARY Take 1 tablet by mouth See admin instructions. Bone Strength tablets (D-3, Calcium, K-1, K-2, Vanadium, Silicon, Strontium, Magnesium) Take 2 tablets by mouth in the evening   Yes [provider]  ondansetron (ZOFRAN ODT) 4 MG disintegrating tablet Take 1 tablet (4 mg total) by mouth every 6 (six) hours as needed for nausea or vomiting. 06/12/17  Yes Rai, Ripudeep K, MD  pantoprazole (PROTONIX) 40 MG tablet Take 1 tablet (40 mg total) by mouth daily. 05/15/19  Yes Croitoru, Mihai, MD  Polyethyl Glycol-Propyl Glycol (SYSTANE) 0.4-0.3 % GEL ophthalmic gel Place 1 application into both eyes 2 (two) times a day. Morning and Bedtime   Yes [provider]  Probiotic Product (MEGA PROBIOTIC) CAPS Take 1 capsule by mouth daily with breakfast. Mega Food/MegaFloraWomen's Probiotic   Yes [provider]  rosuvastatin (CRESTOR) 20 MG tablet Take 1 tablet (20 mg total) by mouth daily. 03/22/19  Yes Croitoru, Mihai, MD  sertraline (ZOLOFT) 50 MG tablet Take 1 tablet (50 mg total) by mouth daily. Patient taking differently: Take 50 mg by mouth every evening.  04/09/16  Yes Croitoru, Mihai, MD  Throat Lozenges (IMMUPLEX LOZENGE MT) Take 1 lozenge by mouth 2 (two) times a day. Morning and Evening    Yes [provider]  traMADol (ULTRAM) 50 MG tablet Take one tablet every 8-12 hours as needed for pain. 12/30/18  Yes Lanae Crumbly, PA-C  ipratropium-albuterol (DUONEB) 0.5-2.5 (3) MG/3ML SOLN Take 3 mLs by nebulization every 4 (four) hours as needed. Patient not  taking: Reported on 07/05/2019 06/16/16   Theodis Blaze, MD  oxyCODONE (OXY IR/ROXICODONE) 5 MG immediate release tablet Take 1 tablet (5 mg total) by mouth every 4 (four) hours as needed for moderate pain. Patient not taking: Reported on  01/31/2019 12/09/18   Kathie Dike, MD  senna-docusate (SENOKOT-S) 8.6-50 MG tablet Take 1 tablet by mouth 2 (two) times daily. Patient not taking: Reported on 03/22/2019 06/12/17   Mendel Corning, MD    Allergies    Meclizine, Nitrofurantoin, Sulfa antibiotics, and Sulfonamide derivatives  Review of Systems   Review of Systems All other systems are reviewed and are negative for acute change except as noted in the HPI.  Physical Exam Updated Vital Signs BP (!) 183/82 (BP Location: Left Arm)   Pulse 68   Temp 98.7 F (37.1 C) (Oral)   Resp (!) 22   SpO2 92%   Physical Exam Vitals and nursing note reviewed.  Constitutional:      General: She is not in acute distress.    Appearance: She is not ill-appearing.     Interventions: Cervical collar in place.  HENT:     Head: Normocephalic and atraumatic. No raccoon eyes or Battle's sign.     Jaw: There is normal jaw occlusion.     Comments: No tenderness to palpation of skull. No deformities or crepitus noted. No open wounds, abrasions or lacerations.     Right Ear: Tympanic membrane and external ear normal. No hemotympanum.     Left Ear: Tympanic membrane and external ear normal. No hemotympanum.     Nose: Nose normal. No signs of injury.     Mouth/Throat:     Mouth: Mucous membranes are moist.     Pharynx: Oropharynx is clear.  Eyes:     General: No scleral icterus.       Right eye: No discharge.        Left eye: No discharge.     Extraocular Movements: Extraocular movements intact.     Conjunctiva/sclera: Conjunctivae normal.     Pupils: Pupils are equal, round, and reactive to light.  Neck:     Vascular: No JVD.     Comments: Unable to assess ROM as patient is wearing cervical  collar. Cardiovascular:     Rate and Rhythm: Normal rate and regular rhythm.     Pulses: Normal pulses.          Radial pulses are 2+ on the right side and 2+ on the left side.       Dorsalis pedis pulses are 2+ on the right side and 2+ on the left side.     Heart sounds: Normal heart sounds.  Pulmonary:     Comments: Lung sounds diminished throughout.  Audible expiratory wheeze heard.  Symmetric chest rise. No rales, or rhonchi.  Hypoxic to 88% on room air. Abdominal:     Comments: Abdomen is soft, non-distended, and non-tender in all quadrants. No rigidity, no guarding. No peritoneal signs.  Musculoskeletal:        General: Normal range of motion.     Cervical back: Normal range of motion.     Right lower leg: 1+ Pitting Edema present.     Left lower leg: 1+ Pitting Edema present.     Comments: Legs are equal in length and left leg is not internally or externally rotated.  Tenderness to palpation of left hip and left knee.  Left knee looks swollen, no deformity noted.  Patient unable to lift left leg off the bed.  She can lift her right leg off the bed without difficulty. Neurovascularly intact distally. Compartments soft above and below affected joint.    No tenderness to left ankle or foot.  Full range of motion of  left ankle.  Skin:    General: Skin is warm and dry.     Capillary Refill: Capillary refill takes less than 2 seconds.  Neurological:     Mental Status: She is oriented to person, place, and time.     GCS: GCS eye subscore is 4. GCS verbal subscore is 5. GCS motor subscore is 6.     Comments: Fluent speech, no facial droop.  Patient follows commands.  Strong and equal grip strength in bilateral upper extremities.  Psychiatric:        Behavior: Behavior normal.     ED Results / Procedures / Treatments   Labs (all labs ordered are listed, but only abnormal results are displayed) Labs Reviewed  RESPIRATORY PANEL BY RT PCR (FLU A&B, COVID)  CBC WITH  DIFFERENTIAL/PLATELET  BASIC METABOLIC PANEL  URINALYSIS, ROUTINE W REFLEX MICROSCOPIC  BRAIN NATRIURETIC PEPTIDE    EKG None  Radiology CT Head Wo Contrast  Result Date: 07/05/2019 CLINICAL DATA:  Fall.  On Plavix. EXAM: CT HEAD WITHOUT CONTRAST TECHNIQUE: Contiguous axial images were obtained from the base of the skull through the vertex without intravenous contrast. COMPARISON:  12/06/2018 FINDINGS: Brain: There is atrophy and chronic small vessel disease changes. No acute intracranial abnormality. Specifically, no hemorrhage, hydrocephalus, mass lesion, acute infarction, or significant intracranial injury. Vascular: No hyperdense vessel or unexpected calcification. Skull: No acute calvarial abnormality. Sinuses/Orbits: No acute findings Other: None IMPRESSION: Atrophy, chronic microvascular disease. No acute intracranial abnormality. Electronically Signed   By: Rolm Baptise M.D.   On: 07/05/2019 20:35   CT Cervical Spine Wo Contrast  Result Date: 07/05/2019 CLINICAL DATA:  Fall EXAM: CT CERVICAL SPINE WITHOUT CONTRAST TECHNIQUE: Multidetector CT imaging of the cervical spine was performed without intravenous contrast. Multiplanar CT image reconstructions were also generated. COMPARISON:  06/08/2017 FINDINGS: Alignment: Normal Skull base and vertebrae: No acute fracture. No primary bone lesion or focal pathologic process. Soft tissues and spinal canal: No prevertebral fluid or swelling. No visible canal hematoma. Disc levels: Moderate to advanced diffuse degenerative facet disease, left greater than right. Disc spaces maintained. Mild anterior spurring Upper chest: No acute findings Other: None IMPRESSION: Degenerative disc and advanced degenerative facet disease. No acute bony abnormality. Electronically Signed   By: Rolm Baptise M.D.   On: 07/05/2019 20:37   DG Chest Portable 1 View  Result Date: 07/05/2019 CLINICAL DATA:  Fall.  Left hip and knee pain EXAM: PORTABLE CHEST 1 VIEW COMPARISON:   12/06/2018 FINDINGS: Prior CABG. Low lung volumes. No confluent opacities, effusions or edema. Heart is normal size. No acute bony abnormality. IMPRESSION: No active disease. Electronically Signed   By: Rolm Baptise M.D.   On: 07/05/2019 20:29   DG Knee Complete 4 Views Left  Result Date: 07/05/2019 CLINICAL DATA:  Fall.  Left knee pain EXAM: LEFT KNEE - COMPLETE 4+ VIEW COMPARISON:  None. FINDINGS: There is a nondisplaced fracture through the proximal left fibular metaphysis. No additional fracture seen. For moderate to large joint effusion. Joint spaces maintained. IMPRESSION: Left fibular neck fracture. Moderate to large joint effusion. Electronically Signed   By: Rolm Baptise M.D.   On: 07/05/2019 20:28   DG Hip Unilat W or Wo Pelvis 2-3 Views Left  Result Date: 07/05/2019 CLINICAL DATA:  Fall, left hip pain EXAM: DG HIP (WITH OR WITHOUT PELVIS) 2-3V LEFT COMPARISON:  01/31/2019 FINDINGS: Patient is status post left hip replacement. Old healed left inferior pubic ramus fracture. No acute fracture, subluxation or dislocation. No  hardware complicating feature. IMPRESSION: Left hip replacement. No acute bony abnormality. Electronically Signed   By: Rolm Baptise M.D.   On: 07/05/2019 20:28    Procedures Procedures (including critical care time)  Medications Ordered in ED Medications - No data to display  ED Course  I have reviewed the triage vital signs and the nursing notes.  Pertinent labs & imaging results that were available during my care of the patient were reviewed by me and considered in my medical decision making (see chart for details).    MDM Rules/Calculators/A&P                      Patient seen and examined. Patient presents awake, alert, hemodynamically stable, afebrile, non toxic.  During exam she is hypoxic to 88% on room air.  I did place patient on 2 L with improvement to 96%.  She does not wear oxygen at home.  Audible expiratory wheeze heard, lung sounds otherwise  diminished throughout.  Symmetric chest rise, normal work of breathing.  No abdominal tenderness.  She does have tenderness to palpation of left hip and knee.  Left knee appears swollen.  Left leg is not shortened or rotated. Neurovascularly intact distally. Compartments soft above and below affected joint.  Left ankle is nontender and has full range of motion.  She does have 1+ bilateral lower extremity edema which family states is unusual for her. Work-up initiated with labs including CBC, BMP, BNP, UA, Covid PCR test.  Patient currently lives at [facility] and family reports a current Covid outbreak in the facility.  Given her fall and being on a blood thinner will image head, neck, hip and knee.  Chest x-ray also ordered given her hypoxia and wheezing.  CT head and neck are negative for acute traumatic findings. I viewed pt's chest xray and it does not suggest acute infectious processes.  Straight of left hip is negative, no signs of fracture or dislocation.  X-ray of left knee shows left fibular neck fracture with moderate to large joint effusion. Consulted on-call orthopedics Dr. Lucia Gaskins reviewed patient's films.  He recommends patient can be weight bearing as tolerated.  If needed she can wear knee immobilizer but does not necessarily have to.  If admitted to the hospital he would recommend PT OT eval.  X-ray of left ankle added on to rule out ankle injury.  On exam ankle is nontender and she had full range of motion however given this injury will get dedicated films to be sure.   Patient care transferred to Dr. Roslynn Amble at the end of my shift pending labs, UA, ankle film, covid test result. Patient presentation, ED course, and plan of care discussed with review of all pertinent labs and imaging. Please see his note for further details regarding further ED course and disposition. Anticipate admission.  KEVIANA BALSAM was evaluated in Emergency Department on 07/05/2019 for the symptoms described  in the history of present illness. She was evaluated in the context of the global COVID-19 pandemic, which necessitated consideration that the patient might be at risk for infection with the SARS-CoV-2 virus that causes COVID-19. Institutional protocols and algorithms that pertain to the evaluation of patients at risk for COVID-19 are in a state of rapid change based on information released by regulatory bodies including the CDC and federal and state organizations. These policies and algorithms were followed during the patient's care in the ED.  Portions of this note were generated with Dragon dictation  software. Dictation errors may occur despite best attempts at proofreading.   Final Clinical Impression(s) / ED Diagnoses Final diagnoses:  Closed fracture of proximal end of left fibula, unspecified fracture morphology, initial encounter    Rx / DC Orders ED Discharge Orders    None       Flint Melter 07/05/19 [id number]    Lucrezia Starch, MD 07/05/19 2349    Lucrezia Starch, MD 07/05/19 2351

## 2019-07-05 NOTE — ED Notes (Signed)
Respiratory at bedside for neb treatment.

## 2019-07-05 NOTE — ED Notes (Signed)
Pt transported to radiology for xray and CT

## 2019-07-05 NOTE — ED Triage Notes (Signed)
85 yo female BIB [facility] from [facility] assisted [facility]. S/p witness fall earlier today. Staff reports pt did not hit her head. PT is c/o left knee pain with swelling. Pt is confused at baseline, pt is also on anticoagulant therapy, Plavix. Facility reports pt has recently finished a course of antibiotics for treatment of UTI. Pt is a DNR   Vitals: bp 168/94 Hr 70 rr 20 spo2 95% ra Temp 98.4

## 2019-07-05 NOTE — ED Notes (Signed)
Pt transported to xray 

## 2019-07-06 ENCOUNTER — Other Ambulatory Visit: Payer: Self-pay

## 2019-07-06 DIAGNOSIS — S8263XA Displaced fracture of lateral malleolus of unspecified fibula, initial encounter for closed fracture: Secondary | ICD-10-CM | POA: Diagnosis present

## 2019-07-06 DIAGNOSIS — E875 Hyperkalemia: Secondary | ICD-10-CM

## 2019-07-06 DIAGNOSIS — R410 Disorientation, unspecified: Secondary | ICD-10-CM | POA: Diagnosis not present

## 2019-07-06 DIAGNOSIS — Z1611 Resistance to penicillins: Secondary | ICD-10-CM | POA: Diagnosis present

## 2019-07-06 DIAGNOSIS — I5032 Chronic diastolic (congestive) heart failure: Secondary | ICD-10-CM | POA: Diagnosis present

## 2019-07-06 DIAGNOSIS — S82402A Unspecified fracture of shaft of left fibula, initial encounter for closed fracture: Secondary | ICD-10-CM

## 2019-07-06 DIAGNOSIS — Z20822 Contact with and (suspected) exposure to covid-19: Secondary | ICD-10-CM | POA: Diagnosis present

## 2019-07-06 DIAGNOSIS — I77810 Thoracic aortic ectasia: Secondary | ICD-10-CM | POA: Diagnosis not present

## 2019-07-06 DIAGNOSIS — Z87440 Personal history of urinary (tract) infections: Secondary | ICD-10-CM | POA: Diagnosis not present

## 2019-07-06 DIAGNOSIS — J849 Interstitial pulmonary disease, unspecified: Secondary | ICD-10-CM | POA: Diagnosis present

## 2019-07-06 DIAGNOSIS — S8262XA Displaced fracture of lateral malleolus of left fibula, initial encounter for closed fracture: Secondary | ICD-10-CM

## 2019-07-06 DIAGNOSIS — I25810 Atherosclerosis of coronary artery bypass graft(s) without angina pectoris: Secondary | ICD-10-CM

## 2019-07-06 DIAGNOSIS — Z8673 Personal history of transient ischemic attack (TIA), and cerebral infarction without residual deficits: Secondary | ICD-10-CM | POA: Diagnosis not present

## 2019-07-06 DIAGNOSIS — Z96642 Presence of left artificial hip joint: Secondary | ICD-10-CM | POA: Diagnosis present

## 2019-07-06 DIAGNOSIS — B952 Enterococcus as the cause of diseases classified elsewhere: Secondary | ICD-10-CM | POA: Diagnosis present

## 2019-07-06 DIAGNOSIS — E039 Hypothyroidism, unspecified: Secondary | ICD-10-CM | POA: Diagnosis not present

## 2019-07-06 DIAGNOSIS — G92 Toxic encephalopathy: Secondary | ICD-10-CM | POA: Diagnosis present

## 2019-07-06 DIAGNOSIS — Z951 Presence of aortocoronary bypass graft: Secondary | ICD-10-CM | POA: Diagnosis not present

## 2019-07-06 DIAGNOSIS — N390 Urinary tract infection, site not specified: Principal | ICD-10-CM

## 2019-07-06 DIAGNOSIS — Z66 Do not resuscitate: Secondary | ICD-10-CM | POA: Diagnosis not present

## 2019-07-06 DIAGNOSIS — I2510 Atherosclerotic heart disease of native coronary artery without angina pectoris: Secondary | ICD-10-CM | POA: Diagnosis present

## 2019-07-06 DIAGNOSIS — K219 Gastro-esophageal reflux disease without esophagitis: Secondary | ICD-10-CM | POA: Diagnosis present

## 2019-07-06 DIAGNOSIS — I110 Hypertensive heart disease with heart failure: Secondary | ICD-10-CM | POA: Diagnosis present

## 2019-07-06 DIAGNOSIS — W19XXXA Unspecified fall, initial encounter: Secondary | ICD-10-CM | POA: Diagnosis not present

## 2019-07-06 DIAGNOSIS — F411 Generalized anxiety disorder: Secondary | ICD-10-CM | POA: Diagnosis present

## 2019-07-06 DIAGNOSIS — S82839A Other fracture of upper and lower end of unspecified fibula, initial encounter for closed fracture: Secondary | ICD-10-CM

## 2019-07-06 DIAGNOSIS — S82832A Other fracture of upper and lower end of left fibula, initial encounter for closed fracture: Secondary | ICD-10-CM | POA: Diagnosis present

## 2019-07-06 DIAGNOSIS — W1830XA Fall on same level, unspecified, initial encounter: Secondary | ICD-10-CM | POA: Diagnosis present

## 2019-07-06 DIAGNOSIS — J9601 Acute respiratory failure with hypoxia: Secondary | ICD-10-CM | POA: Diagnosis present

## 2019-07-06 DIAGNOSIS — D649 Anemia, unspecified: Secondary | ICD-10-CM | POA: Diagnosis present

## 2019-07-06 DIAGNOSIS — E785 Hyperlipidemia, unspecified: Secondary | ICD-10-CM | POA: Diagnosis present

## 2019-07-06 DIAGNOSIS — R4182 Altered mental status, unspecified: Secondary | ICD-10-CM

## 2019-07-06 LAB — URINALYSIS, ROUTINE W REFLEX MICROSCOPIC
Bilirubin Urine: NEGATIVE
Glucose, UA: 150 mg/dL — AB
Hgb urine dipstick: NEGATIVE
Ketones, ur: NEGATIVE mg/dL
Nitrite: NEGATIVE
Protein, ur: NEGATIVE mg/dL
Specific Gravity, Urine: 1.041 — ABNORMAL HIGH (ref 1.005–1.030)
pH: 7 (ref 5.0–8.0)

## 2019-07-06 LAB — BASIC METABOLIC PANEL
Anion gap: 9 (ref 5–15)
BUN: 11 mg/dL (ref 8–23)
CO2: 27 mmol/L (ref 22–32)
Calcium: 8.8 mg/dL — ABNORMAL LOW (ref 8.9–10.3)
Chloride: 100 mmol/L (ref 98–111)
Creatinine, Ser: 0.52 mg/dL (ref 0.44–1.00)
GFR calc Af Amer: >60 mL/min (ref >60)
GFR calc non Af Amer: >60 mL/min (ref >60)
Glucose, Bld: 147 mg/dL — ABNORMAL HIGH (ref 70–99)
Potassium: 3.4 mmol/L — ABNORMAL LOW (ref 3.5–5.1)
Sodium: 136 mmol/L (ref 135–145)

## 2019-07-06 LAB — BLOOD GAS, VENOUS
Acid-Base Excess: 6.1 mmol/L — ABNORMAL HIGH (ref 0.0–2.0)
Bicarbonate: 31.1 mmol/L — ABNORMAL HIGH (ref 20.0–28.0)
O2 Saturation: 80.3 %
Patient temperature: 98.6
pCO2, Ven: 48.5 mmHg (ref 44.0–60.0)
pH, Ven: 7.422 (ref 7.250–7.430)
pO2, Ven: 45.2 mmHg — ABNORMAL HIGH (ref 32.0–45.0)

## 2019-07-06 LAB — POTASSIUM: Potassium: 3.2 mmol/L — ABNORMAL LOW (ref 3.5–5.1)

## 2019-07-06 LAB — CK: Total CK: 20 U/L — ABNORMAL LOW (ref 38–234)

## 2019-07-06 LAB — TSH: TSH: 0.999 u[IU]/mL (ref 0.350–4.500)

## 2019-07-06 LAB — CBC
HCT: 34.5 % — ABNORMAL LOW (ref 36.0–46.0)
Hemoglobin: 11 g/dL — ABNORMAL LOW (ref 12.0–15.0)
MCH: 31.7 pg (ref 26.0–34.0)
MCHC: 31.9 g/dL (ref 30.0–36.0)
MCV: 99.4 fL (ref 80.0–100.0)
Platelets: 236 10*3/uL (ref 150–400)
RBC: 3.47 MIL/uL — ABNORMAL LOW (ref 3.87–5.11)
RDW: 12.5 % (ref 11.5–15.5)
WBC: 11.2 10*3/uL — ABNORMAL HIGH (ref 4.0–10.5)
nRBC: 0 % (ref 0.0–0.2)

## 2019-07-06 LAB — MRSA PCR SCREENING: MRSA by PCR: NEGATIVE

## 2019-07-06 MED ORDER — IPRATROPIUM-ALBUTEROL 0.5-2.5 (3) MG/3ML IN SOLN
3.0000 mL | Freq: Two times a day (BID) | RESPIRATORY_TRACT | Status: DC
  Administered 2019-07-06 – 2019-07-08 (×5): 3 mL via RESPIRATORY_TRACT
  Filled 2019-07-06 (×5): qty 3

## 2019-07-06 MED ORDER — METOPROLOL SUCCINATE ER 25 MG PO TB24
25.0000 mg | ORAL_TABLET | Freq: Every day | ORAL | Status: DC
  Administered 2019-07-06 – 2019-07-09 (×4): 25 mg via ORAL
  Filled 2019-07-06 (×4): qty 1

## 2019-07-06 MED ORDER — CLOPIDOGREL BISULFATE 75 MG PO TABS
75.0000 mg | ORAL_TABLET | Freq: Every day | ORAL | Status: DC
  Administered 2019-07-06 – 2019-07-09 (×4): 75 mg via ORAL
  Filled 2019-07-06 (×4): qty 1

## 2019-07-06 MED ORDER — SODIUM CHLORIDE 0.9 % IV SOLN
1.0000 g | Freq: Four times a day (QID) | INTRAVENOUS | Status: DC
  Administered 2019-07-06 – 2019-07-07 (×4): 1 g via INTRAVENOUS
  Filled 2019-07-06: qty 1
  Filled 2019-07-06: qty 1000
  Filled 2019-07-06 (×3): qty 1

## 2019-07-06 MED ORDER — PANTOPRAZOLE SODIUM 40 MG PO TBEC
40.0000 mg | DELAYED_RELEASE_TABLET | Freq: Every day | ORAL | Status: DC
  Administered 2019-07-06 – 2019-07-09 (×4): 40 mg via ORAL
  Filled 2019-07-06 (×4): qty 1

## 2019-07-06 MED ORDER — DOCUSATE SODIUM 100 MG PO CAPS
100.0000 mg | ORAL_CAPSULE | Freq: Two times a day (BID) | ORAL | Status: DC
  Administered 2019-07-06 – 2019-07-09 (×7): 100 mg via ORAL
  Filled 2019-07-06 (×7): qty 1

## 2019-07-06 MED ORDER — SERTRALINE HCL 50 MG PO TABS
50.0000 mg | ORAL_TABLET | Freq: Every evening | ORAL | Status: DC
  Administered 2019-07-06 – 2019-07-08 (×3): 50 mg via ORAL
  Filled 2019-07-06 (×3): qty 1

## 2019-07-06 MED ORDER — D-MANNOSE 500 MG PO CAPS: 500.0000 mg | ORAL_CAPSULE | Freq: Two times a day (BID) | ORAL | Status: DC

## 2019-07-06 MED ORDER — LOSARTAN POTASSIUM 50 MG PO TABS
50.0000 mg | ORAL_TABLET | Freq: Two times a day (BID) | ORAL | Status: DC
  Administered 2019-07-06 – 2019-07-09 (×7): 50 mg via ORAL
  Filled 2019-07-06 (×7): qty 1

## 2019-07-06 MED ORDER — LEVOTHYROXINE SODIUM 88 MCG PO TABS
88.0000 ug | ORAL_TABLET | Freq: Every day | ORAL | Status: DC
  Administered 2019-07-06 – 2019-07-09 (×4): 88 ug via ORAL
  Filled 2019-07-06 (×5): qty 1

## 2019-07-06 MED ORDER — IPRATROPIUM-ALBUTEROL 0.5-2.5 (3) MG/3ML IN SOLN: 3.0000 mL | Freq: Four times a day (QID) | RESPIRATORY_TRACT | Status: DC

## 2019-07-06 MED ORDER — ENSURE ENLIVE PO LIQD
237.0000 mL | Freq: Two times a day (BID) | ORAL | Status: DC
  Administered 2019-07-06 – 2019-07-09 (×8): 237 mL via ORAL

## 2019-07-06 MED ORDER — HYDROCODONE-ACETAMINOPHEN 5-325 MG PO TABS: 1.0000 | ORAL_TABLET | ORAL | Status: DC | PRN

## 2019-07-06 MED ORDER — ASPIRIN EC 81 MG PO TBEC
81.0000 mg | DELAYED_RELEASE_TABLET | Freq: Every evening | ORAL | Status: DC
  Administered 2019-07-06 – 2019-07-08 (×3): 81 mg via ORAL
  Filled 2019-07-06 (×3): qty 1

## 2019-07-06 MED ORDER — ALBUTEROL SULFATE (2.5 MG/3ML) 0.083% IN NEBU: 2.5000 mg | INHALATION_SOLUTION | RESPIRATORY_TRACT | Status: DC | PRN

## 2019-07-06 MED ORDER — ISOSORBIDE MONONITRATE ER 30 MG PO TB24
15.0000 mg | ORAL_TABLET | Freq: Every day | ORAL | Status: DC
  Administered 2019-07-06: 10:00:00 15 mg via ORAL
  Filled 2019-07-06: qty 1

## 2019-07-06 MED ORDER — ENOXAPARIN SODIUM 40 MG/0.4ML ~~LOC~~ SOLN
40.0000 mg | SUBCUTANEOUS | Status: DC
  Administered 2019-07-06 – 2019-07-09 (×4): 40 mg via SUBCUTANEOUS
  Filled 2019-07-06 (×4): qty 0.4

## 2019-07-06 MED ORDER — IPRATROPIUM-ALBUTEROL 0.5-2.5 (3) MG/3ML IN SOLN
3.0000 mL | Freq: Four times a day (QID) | RESPIRATORY_TRACT | Status: DC
  Administered 2019-07-06 (×2): 3 mL via RESPIRATORY_TRACT
  Filled 2019-07-06 (×2): qty 3

## 2019-07-06 MED ORDER — SODIUM POLYSTYRENE SULFONATE 15 GM/60ML PO SUSP
15.0000 g | Freq: Once | ORAL | Status: AC
  Administered 2019-07-06: 15 g via ORAL
  Filled 2019-07-06: qty 60

## 2019-07-06 MED ORDER — IPRATROPIUM-ALBUTEROL 0.5-2.5 (3) MG/3ML IN SOLN: 3.0000 mL | Freq: Four times a day (QID) | RESPIRATORY_TRACT | Status: DC | PRN

## 2019-07-06 MED ORDER — ROSUVASTATIN CALCIUM 20 MG PO TABS
20.0000 mg | ORAL_TABLET | Freq: Every day | ORAL | Status: DC
  Administered 2019-07-06 – 2019-07-09 (×4): 20 mg via ORAL
  Filled 2019-07-06 (×4): qty 1

## 2019-07-06 MED ORDER — MORPHINE SULFATE (PF) 2 MG/ML IV SOLN
0.5000 mg | Freq: Once | INTRAVENOUS | Status: AC
  Administered 2019-07-06: 01:00:00 0.5 mg via INTRAVENOUS
  Filled 2019-07-06: qty 1

## 2019-07-06 MED ORDER — ACETAMINOPHEN 325 MG PO TABS
650.0000 mg | ORAL_TABLET | Freq: Four times a day (QID) | ORAL | Status: DC | PRN
  Administered 2019-07-06 – 2019-07-07 (×3): 650 mg via ORAL
  Filled 2019-07-06 (×3): qty 2

## 2019-07-06 MED ORDER — CEPHALEXIN 250 MG PO CAPS
250.0000 mg | ORAL_CAPSULE | Freq: Every evening | ORAL | Status: DC
  Administered 2019-07-06: 250 mg via ORAL
  Filled 2019-07-06: qty 1

## 2019-07-06 NOTE — Progress Notes (Signed)
PROGRESS NOTE    Gina Bowers  [id number] DOB: Aug 13, 1925 DOA: 07/05/2019 PCP: Lavone Orn, MD    Brief Narrative:  85 y.o. female with medical history significant for CADs/pCABG, PAD, chronic diastolic CHF, hypertension, hyperlipidemia, hypothyroidism, CVA on Plavix,left hip replacementwho presents s/p fall.  Patient alert and oriented only to self and location but otherwise unable to provide history.  Entirety of history obtained from ER physician and granddaughter at bedside. Granddaughter states that she has noticed a progressive decline in her mental state for the past 3 weeks and has noticed an acute change since 4 PM today. She was recently treated for a UTI  With Levaquin and is kept on chronic antibiotic for recurrent UTIs.  Since then has had progressive weakness and sustained a fall on Sunday. Normally walks with cane. Since her first fall she has not wanted to ambulate and again had a fall today prompting ED evaluation.  Patient at baseline per granddaughter is alert and oriented x4 and frequently does word puzzles,plays jeopardy and is very conversive.   Granddaughter denies her having any fever or chills but has noticed an increased tremor of both of her hands since the falls.  Denies having any complaints of chest pain, shortness of breath.  No cough or runny nose. No focal neurological deficits.  Patient has had a good appetite and denies any nausea, vomiting diarrhea or constipation.  In the ED, she was afebrile and hypertensive up to systolic of [id number] and required 2.5L via [place] with oxygen desaturation down to 88% on room air. Lab work significant for elevated WBC of 13.4, hemoglobin of 11.8.  K of 6.3, glucose of 114. Creatinine normal at 0.70.    CT head negative. CT cervical negative for fracture.  CTA chest negative for PE. Showed chronic ILD and cardiomegaly.  X-ray of left hip negative X-ray of left knee shows left fibular neck fracture with moderate to  large joint effusion Left ankle X-ray with small avulsion fracture off the tip of the lateral malleolus  ED physician consultedon-call orthopedics Dr. Lucia Gaskins who reviewed patient's films. He recommends patient can be weight bearing as tolerated. If needed she can wear knee immobilizer but does not necessarily have to. If admitted to the hospital he would recommend PT/OT eval.  She was given albuterol neb, insulin 5 units and D50 for her hyperkalemia.  Assessment & Plan:   Principal Problem:   Fall Active Problems:   Hypothyroidism   Anxiety state   Coronary artery disease involving coronary bypass graft of native heart   Dilatation of thoracic aorta (HCC)   Altered mental status   Hyperkalemia   Avulsion fracture of lateral malleolus   Left fibular fracture   Chronic UTI   Hx of CABG   History of CVA (cerebrovascular accident)   Altered mental status, toxic metabolic encephalopathy CT head reviewed, unremarkable Urine cx pending- pt recently treated with UTI and chronically on Keflex Check VBG reviewed, unremarkable Pt is continued on keflex Repeat cbc in AM  Acute hypoxic respiratory failure possibly acute on chronic worsening of ILD seen on CTA chest no PE, no findings on imaging or exam to suggest acute CHF WBC mildly elevated but no fever or other symptoms to suggest infectious cause  will continued duoneb q6hr as tolerated Presently on minimal O2 support  small avulsion fracture off the tip of the left lateral malleolus  Left fibular neck fracture with moderate to large joint effusion s/p mechanical fall from weakness Ortho consulted  by ED physician  Ortho recommendation for weight bearing as tolerated Knee and ankle immobilizer in place PT/OT consulted  Hyperkalemia K of 6.2 from prior normal. No EKG changes. unclear cause- check CPK due to recent fall/trauma received  insulin 5 units and D50 in the ED will repeat potassium improved to 3.4 Repeat  bmet in AM  Leukocytosis Improving On keflex per above for UTI Also possibly reactive secondary to above avulsion fracture Repeat CBC in AM  Chronic UTI Urine cx pending Past urine cx reviewed, pt has hx of pseudomonas and enterococcus UTI's Continued on Keflex  Chronic anemia Remains stable in the 11 range Hemodynamically stable Repeat CBC in AM  Ascending thoracic aortic dilatation Incidential finding of CTA chest 4cm Recommendation for annual follow up by CTA or MRA   Hx of CVA  CT head negative and no focal neurological deficit continue aspirin and plavix as tolerated  Hx of CAD s/p CABG  continue Imdur, losartan, metoprolol No chest pain this AM, stable  Anxiety continue Zoloft as tolerated  Hypothyroidism Continue levothyroxine TSH of 0.999  DVT prophylaxis: Lovenox subq Code Status: DNR Family Communication: Pt in room, family not at bedside Disposition Plan: Pending PT eval, d/c when mentation resolves  Consultants:     Procedures:     Antimicrobials: Anti-infectives (From admission, onward)   Start     Dose/Rate Route Frequency Ordered Stop   07/06/19 1800  cephALEXin (KEFLEX) capsule 250 mg     250 mg Oral Every evening 07/06/19 0112         Subjective: Still somewhat confused today  Objective: Vitals:   07/06/19 0406 07/06/19 0827 07/06/19 0859 07/06/19 1348  BP:    (!) 117/59  Pulse:   77 77  Resp:    17  Temp:    98 F (36.7 C)  TempSrc:    Axillary  SpO2:  91% 91% 93%  Weight: 74.6 kg     Height:       No intake or output data in the 24 hours ending 07/06/19 1709 Filed Weights   07/06/19 0406  Weight: 74.6 kg    Examination: General exam: Awake, laying in bed, in nad Respiratory system: Normal respiratory effort, no wheezing Cardiovascular system: regular rate, s1, s2 Gastrointestinal system: Soft, nondistended, positive BS Central nervous system: CN2-12 grossly intact, strength intact Extremities:  Perfused, no clubbing Skin: Normal skin turgor, no notable skin lesions seen Psychiatry: somewhat confused, mood appears stable  Data Reviewed: I have personally reviewed following labs and imaging studies  CBC: Recent Labs  Lab 07/05/19 2115 07/06/19 0356  WBC 13.4* 11.2*  NEUTROABS 10.5*  --   HGB 11.8* 11.0*  HCT 36.3 34.5*  MCV 98.6 99.4  PLT 270 [id number]   Basic Metabolic Panel: Recent Labs  Lab 07/05/19 2115 07/06/19 0119 07/06/19 0356  NA 136  --  136  K 6.2* 3.2* 3.4*  CL 98  --  100  CO2 27  --  27  GLUCOSE 114*  --  147*  BUN 14  --  11  CREATININE 0.70  --  0.52  CALCIUM 9.0  --  8.8*   GFR: Estimated Creatinine Clearance: 42.3 mL/min (by C-G formula based on SCr of 0.52 mg/dL). Liver Function Tests: No results for input(s): AST, ALT, ALKPHOS, BILITOT, PROT, ALBUMIN in the last 168 hours. No results for input(s): LIPASE, AMYLASE in the last 168 hours. No results for input(s): AMMONIA in the last 168 hours. Coagulation Profile: No results  for input(s): INR, PROTIME in the last 168 hours. Cardiac Enzymes: Recent Labs  Lab 07/06/19 0119  CKTOTAL 20*   BNP (last 3 results) No results for input(s): PROBNP in the last 8760 hours. HbA1C: No results for input(s): HGBA1C in the last 72 hours. CBG: No results for input(s): GLUCAP in the last 168 hours. Lipid Profile: No results for input(s): CHOL, HDL, LDLCALC, TRIG, CHOLHDL, LDLDIRECT in the last 72 hours. Thyroid Function Tests: Recent Labs    07/06/19 0356  TSH 0.999   Anemia Panel: No results for input(s): VITAMINB12, FOLATE, FERRITIN, TIBC, IRON, RETICCTPCT in the last 72 hours. Sepsis Labs: No results for input(s): PROCALCITON, LATICACIDVEN in the last 168 hours.  Recent Results (from the past 240 hour(s))  Respiratory Panel by RT PCR (Flu A&B, Covid) - Nasopharyngeal Swab     Status: None   Collection Time: 07/05/19  7:54 PM   Specimen: Nasopharyngeal Swab  Result Value Ref Range Status   SARS  Coronavirus 2 by RT PCR NEGATIVE NEGATIVE Final    Comment: (NOTE) SARS-CoV-2 target nucleic acids are NOT DETECTED. The SARS-CoV-2 RNA is generally detectable in upper respiratoy specimens during the acute phase of infection. The lowest concentration of SARS-CoV-2 viral copies this assay can detect is 131 copies/mL. A negative result does not preclude SARS-Cov-2 infection and should not be used as the sole basis for treatment or other patient management decisions. A negative result may occur with  improper specimen collection/handling, submission of specimen other than nasopharyngeal swab, presence of viral mutation(s) within the areas targeted by this assay, and inadequate number of viral copies (<131 copies/mL). A negative result must be combined with clinical observations, patient history, and epidemiological information. The expected result is Negative. Fact Sheet for Patients:  PinkCheek.be Fact Sheet for Healthcare Providers:  GravelBags.it This test is not yet ap proved or cleared by the [place] FDA and  has been authorized for detection and/or diagnosis of SARS-CoV-2 by FDA under an Emergency Use Authorization (EUA). This EUA will remain  in effect (meaning this test can be used) for the duration of the COVID-19 declaration under Section 564(b)(1) of the Act, 21 U.S.C. section 360bbb-3(b)(1), unless the authorization is terminated or revoked sooner.    Influenza A by PCR NEGATIVE NEGATIVE Final   Influenza B by PCR NEGATIVE NEGATIVE Final    Comment: (NOTE) The Xpert Xpress SARS-CoV-2/FLU/RSV assay is intended as an aid in  the diagnosis of influenza from Nasopharyngeal swab specimens and  should not be used as a sole basis for treatment. Nasal washings and  aspirates are unacceptable for Xpert Xpress SARS-CoV-2/FLU/RSV  testing. Fact Sheet for Patients: PinkCheek.be Fact Sheet  for Healthcare Providers: GravelBags.it This test is not yet approved or cleared by the [place] FDA and  has been authorized for detection and/or diagnosis of SARS-CoV-2 by  FDA under an Emergency Use Authorization (EUA). This EUA will remain  in effect (meaning this test can be used) for the duration of the  Covid-19 declaration under Section 564(b)(1) of the Act, 21  U.S.C. section 360bbb-3(b)(1), unless the authorization is  terminated or revoked. Performed at [facility], [place] [address], [place], [place] [address]   MRSA PCR Screening     Status: None   Collection Time: 07/06/19  2:53 AM   Specimen: Nasal Mucosa; Nasopharyngeal  Result Value Ref Range Status   MRSA by PCR NEGATIVE NEGATIVE Final    Comment:        The  GeneXpert MRSA Assay (FDA approved for NASAL specimens only), is one component of a comprehensive MRSA colonization surveillance program. It is not intended to diagnose MRSA infection nor to guide or monitor treatment for MRSA infections. Performed at [facility], [place] [address] [place], [place] [address]      Radiology Studies: DG Ankle Complete Left  Result Date: 07/05/2019 CLINICAL DATA:  Left ankle pain, fell, lateral swelling EXAM: LEFT ANKLE COMPLETE - 3+ VIEW COMPARISON:  None. FINDINGS: Frontal, oblique, and lateral views of the left ankle are obtained. There is a small avulsion fracture at the tip the lateral malleolus. Mild overlying soft tissue edema. There is diffuse osteoarthritis of the ankle, hindfoot, and midfoot. Prominent calcaneal spurs are noted. IMPRESSION: 1. Small avulsion fracture off the tip of the lateral malleolus. 2. Diffuse osteoarthritis. Electronically Signed   By: Randa Ngo M.D.   On: 07/05/2019 23:01   CT Head Wo Contrast  Result Date: 07/05/2019 CLINICAL DATA:  Fall.  On Plavix. EXAM: CT HEAD WITHOUT CONTRAST TECHNIQUE: Contiguous axial images were  obtained from the base of the skull through the vertex without intravenous contrast. COMPARISON:  12/06/2018 FINDINGS: Brain: There is atrophy and chronic small vessel disease changes. No acute intracranial abnormality. Specifically, no hemorrhage, hydrocephalus, mass lesion, acute infarction, or significant intracranial injury. Vascular: No hyperdense vessel or unexpected calcification. Skull: No acute calvarial abnormality. Sinuses/Orbits: No acute findings Other: None IMPRESSION: Atrophy, chronic microvascular disease. No acute intracranial abnormality. Electronically Signed   By: Rolm Baptise M.D.   On: 07/05/2019 20:35   CT Angio Chest PE W and/or Wo Contrast  Addendum Date: 07/05/2019   ADDENDUM REPORT: 07/05/2019 23:34 ADDENDUM: Ascending thoracic aortic dilatation up to 4 cm. Recommend annual imaging followup by CTA or MRA. This recommendation follows 2010 ACCF/AHA/AATS/ACR/ASA/SCA/SCAI/SIR/STS/SVM Guidelines for the Diagnosis and Management of Patients with Thoracic Aortic Disease. Circulation. 2010; [id number]. Aortic aneurysm NOS (ICD10-I71.9) This addendum was called by telephone at the time of interpretation on 07/05/2019 at 11:34 pm to provider [facility] , who verbally acknowledged these results. Electronically Signed   By: Lovena Le M.D.   On: 07/05/2019 23:34   Result Date: 07/05/2019 CLINICAL DATA:  Concern for PE, hypoxia and shortness of breath EXAM: CT ANGIOGRAPHY CHEST WITH CONTRAST TECHNIQUE: Multidetector CT imaging of the chest was performed using the standard protocol during bolus administration of intravenous contrast. Multiplanar CT image reconstructions and MIPs were obtained to evaluate the vascular anatomy. CONTRAST:  145mL OMNIPAQUE IOHEXOL 350 MG/ML SOLN COMPARISON:  CT 06/12/2016 FINDINGS: Cardiovascular: Satisfactory opacification of the pulmonary arteries without large central or lobar filling defects. More distal evaluation is limited by respiratory motion and  cardiac pulsation artifact. Mild cardiomegaly. Extensive coronary artery calcifications and postsurgical changes related to prior CABG. No pericardial effusion. Borderline dilation of the ascending thoracic aorta measuring up to 4 cm in diameter. Atherosclerotic calcification present within the thoracic aorta and normally branching proximal great vessels. Mediastinum/Nodes: Postsurgical changes in the anterior mediastinum likely related prior CABG. There is a moderate hiatal hernia. The thoracic esophagus is fluid-filled and patulous. Tortuosity of the trachea is similar to comparison studies. Posterior bowing likely related to imaging during exhalation. Mild diffuse airways thickening is noted. Thyroid gland is diminutive which may reflect prior surgery or radioactive iodine therapy. Lungs/Pleura: Evaluation of the lung parenchyma is limited due to extensive respiratory motion artifact. Subpleural predominant reticular changes are better assessed on comparison exam with diffuse airways thickening and bronchiectatic changes. Mild bronchiectatic  changes and mucous plugging. No focal consolidative opacity is seen. No pneumothorax or effusion. Upper Abdomen: No acute abnormalities present in the visualized portions of the upper abdomen. Of enlarging fluid attenuation cyst in the upper pole left kidney now measuring up to 6 cm in diameter (previously 4.6 cm. No worrisome renal lesion is seen however. Reflux of contrast into the hepatic veins may reflect elevated right heart pressures. Musculoskeletal: Exaggerated thoracic kyphosis. Multilevel discogenic and facet degenerative changes are present. Prior vertebroplasty changes at T10 are similar to prior with small amount of cement within the T9-T10 disc space. Remote right third anterior rib deformity. Increased AP diameter of the chest. Additional degenerative changes noted in the shoulders. No suspicious osseous lesions. Review of the MIP images confirms the above  findings. IMPRESSION: 1. No evidence of acute central or lobar pulmonary embolism. More distal evaluation is limited by respiratory motion and cardiac pulsation artifact. 2. Chronic interstitial lung disease with associated airways thickening and bronchiectatic changes. No focal consolidative opacity to suggest pneumonia. 3. Moderate hiatal hernia with fluid-filled and patulous thoracic esophagus, correlate with features of reflux. 4. Cardiomegaly. Reflux of contrast into the hepatic veins may reflect elevated right heart pressures. 5. Aortic Atherosclerosis (ICD10-I70.0). Electronically Signed: By: Lovena Le M.D. On: 07/05/2019 23:08   CT Cervical Spine Wo Contrast  Result Date: 07/05/2019 CLINICAL DATA:  Fall EXAM: CT CERVICAL SPINE WITHOUT CONTRAST TECHNIQUE: Multidetector CT imaging of the cervical spine was performed without intravenous contrast. Multiplanar CT image reconstructions were also generated. COMPARISON:  06/08/2017 FINDINGS: Alignment: Normal Skull base and vertebrae: No acute fracture. No primary bone lesion or focal pathologic process. Soft tissues and spinal canal: No prevertebral fluid or swelling. No visible canal hematoma. Disc levels: Moderate to advanced diffuse degenerative facet disease, left greater than right. Disc spaces maintained. Mild anterior spurring Upper chest: No acute findings Other: None IMPRESSION: Degenerative disc and advanced degenerative facet disease. No acute bony abnormality. Electronically Signed   By: Rolm Baptise M.D.   On: 07/05/2019 20:37   DG Chest Portable 1 View  Result Date: 07/05/2019 CLINICAL DATA:  Fall.  Left hip and knee pain EXAM: PORTABLE CHEST 1 VIEW COMPARISON:  12/06/2018 FINDINGS: Prior CABG. Low lung volumes. No confluent opacities, effusions or edema. Heart is normal size. No acute bony abnormality. IMPRESSION: No active disease. Electronically Signed   By: Rolm Baptise M.D.   On: 07/05/2019 20:29   DG Knee Complete 4 Views  Left  Result Date: 07/05/2019 CLINICAL DATA:  Fall.  Left knee pain EXAM: LEFT KNEE - COMPLETE 4+ VIEW COMPARISON:  None. FINDINGS: There is a nondisplaced fracture through the proximal left fibular metaphysis. No additional fracture seen. For moderate to large joint effusion. Joint spaces maintained. IMPRESSION: Left fibular neck fracture. Moderate to large joint effusion. Electronically Signed   By: Rolm Baptise M.D.   On: 07/05/2019 20:28   DG Hip Unilat W or Wo Pelvis 2-3 Views Left  Result Date: 07/05/2019 CLINICAL DATA:  Fall, left hip pain EXAM: DG HIP (WITH OR WITHOUT PELVIS) 2-3V LEFT COMPARISON:  01/31/2019 FINDINGS: Patient is status post left hip replacement. Old healed left inferior pubic ramus fracture. No acute fracture, subluxation or dislocation. No hardware complicating feature. IMPRESSION: Left hip replacement. No acute bony abnormality. Electronically Signed   By: Rolm Baptise M.D.   On: 07/05/2019 20:28    Scheduled Meds: . aspirin EC  81 mg Oral QPM  . cephALEXin  250 mg Oral QPM  .  clopidogrel  75 mg Oral Daily  . docusate sodium  100 mg Oral BID  . enoxaparin (LOVENOX) injection  40 mg Subcutaneous Q24H  . feeding supplement (ENSURE ENLIVE)  237 mL Oral BID BM  . ipratropium-albuterol  3 mL Nebulization BID  . isosorbide mononitrate  15 mg Oral Daily  . levothyroxine  88 mcg Oral Q0600  . losartan  50 mg Oral BID  . metoprolol succinate  25 mg Oral Daily  . pantoprazole  40 mg Oral Daily  . rosuvastatin  20 mg Oral Daily  . sertraline  50 mg Oral QPM   Continuous Infusions:   LOS: 0 days   Marylu Lund, MD [facility] Pager On Amion  If 7PM-7AM, please contact night-coverage 07/06/2019, 5:09 PM

## 2019-07-06 NOTE — H&P (Addendum)
History and Physical    Gina Bowers [id number] DOB: 05/16/1926 DOA: 07/05/2019  PCP: Lavone Orn, MD  [facility]  Granddaughter and daughter gives her 24/7 care.  I have personally briefly reviewed patient's old medical records in [place]  Chief Complaint: Fall and altered mental status  HPI: Gina Bowers is a 86 y.o. female with medical history significant for CAD s/p CABG, PAD, chronic diastolic CHF, hypertension, hyperlipidemia, hypothyroidism, CVA on Plavix, left hip replacement who presents s/p fall.  Patient alert and oriented only to self and location but otherwise unable to provide history.  Entirety of history obtained from ER physician and granddaughter at bedside. Granddaughter states that she has noticed a progressive decline in her mental state for the past 3 weeks and has noticed an acute change since 4 PM today. She was recently treated for a UTI  With Levaquin and is kept on chronic antibiotic for recurrent UTIs.  Since then has had progressive weakness and sustained a fall on Sunday. Normally walks with cane. Since her first fall she has not wanted to ambulate and again had a fall today prompting ED evaluation.  Patient at baseline per granddaughter is alert and oriented x4 and frequently does word puzzles,plays jeopardy and is very conversive.   Granddaughter denies her having any fever or chills but has noticed an increased tremor of both of her hands since the falls.  Denies having any complaints of chest pain, shortness of breath.  No cough or runny nose. No focal neurological deficits.  Patient has had a good appetite and denies any nausea, vomiting diarrhea or constipation.  In the ED, she was afebrile and hypertensive up to systolic of [id number] and required 2.5L via  with oxygen desaturation down to 88% on room air. Lab work significant for elevated WBC of 13.4, hemoglobin of 11.8.  K of 6.3, glucose  of 114. Creatinine normal at 0.70.    CT head negative. CT cervical negative for fracture.  CTA chest negative for PE. Showed chronic ILD and cardiomegaly.  X-ray of left hip negative X-ray of left knee shows left fibular neck fracture with moderate to large joint effusion Left ankle X-ray with small avulsion fracture off the tip of the lateral malleolus  ED physician consulted on-call orthopedics Dr. Lucia Gaskins who reviewed patient's films.  He recommends patient can be weight bearing as tolerated.  If needed she can wear knee immobilizer but does not necessarily have to.  If admitted to the hospital he would recommend PT/OT eval.  She was given albuterol neb, insulin 5 units and D50 for her hyperkalemia.     Review of Systems:  Unable to fully obtain due to patient's altered mental status  Past Medical History:  Diagnosis Date  . Arthritis   . Blood transfusion 2007   AFTER HIP REPLACEMENT  . Carotid bruit    PT'S DAUGHTER STATES PT HAD RECENT CAROTID STUDY AND WAS TOLD NO SIGNIFICANT BLOCKAGES  . Chronic diastolic CHF (congestive heart failure) ([place])    a. 07/2015 Ech: Ef 60-65%, Gr1 DD.  Marland Kitchen Coronary artery disease    a. 2009 CABG 4, Dr. Servando Snare (LIMA to LAD , SVG to diagonal, SVG to circumflex, SVG to PDA); b. Cath 2012 in  Recent ill,[place] patent LIMA , Patent SVG to diagonal, occluded SVG to RCA. 5x44mm BMS->native RCA; c. 09/2015 Cath: LM nl, LAD 141m, D1 100, LCX small, OM1 90/small, OM2 70, RCA 45p, patent stent,  VG->dRCA 100, VG->OM1 100, VG->Diag nl, LIMA->LAD nl->Med Rx.  . Depression   . Fracture FEB 2013   FRACTURED LEFT ANKLE--NO SURGERY--WAS IN BOOT UNTIL COUPLE WEEKS AGO  . GERD (gastroesophageal reflux disease)   . High cholesterol   . Hypothyroidism   . Ischemic colitis ([place]) 2010  . Kidney stones    IN THE PAST  . 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 [place] virus    COUPLE OF WEEKS AGO--ALL SYMTOMS RESOLVED PER PT  . Obstructive  sleep apnea 2010  . Shortness of breath    AND WHEEZING AT TIMES--NO INHALERS  . Stroke ([place])    SEVERAL STROKES -TOLD SHE HAS CEREBELLUM BLOCKAGE AS RESULT OF STROKE--BUT NEUROLOGIST SAID HE DID NOT WANT TO DO ANY PROCEDURE BECAUSE OF HER AGE .  PT HAS SLIGHT LEFT FOOT DROP-DRAGS FOOT WHEN WALKING - USES WALKER  . Urinary incontinence   . UTI (lower urinary tract infection)    HX OF UTI'S-- LAST TIME COUPLE OF MONTHS AGO  . Vertigo     Past Surgical History:  Procedure Laterality Date  . ABDOMINAL HYSTERECTOMY    . APPENDECTOMY    . [place] AND 1990  . CARDIAC CATHETERIZATION N/A 10/07/2015   Procedure: Left Heart Cath and Cors/Grafts Angiography;  Surgeon: Peter M Martinique, MD;  Location: [facility];  Service: Cardiovascular;  Laterality: N/A;  . CATARACT EXTRACTION    . CORONARY ANGIOPLASTY  10/2010   WITH STENT PLACEMENT  . CORONARY ARTERY BYPASS GRAFT  2009  . CYSTOSCOPY WITH INJECTION  09/21/2011   Procedure: CYSTOSCOPY WITH INJECTION;  Surgeon: Ailene Rud, MD;  Location: [facility];  Service: Urology;  Laterality: N/A;  Peri Urethral Macroplastique Injection and Estring Placement  . HERNIA REPAIR    . HIP SURGERY    . JOINT REPLACEMENT  2007   HIP REPLACEMENT -LEFT  . TONSILLECTOMY       reports that she has never smoked. She has never used smokeless tobacco. She reports that she does not drink alcohol or use drugs.  Allergies  Allergen Reactions  . Meclizine Other (See Comments)    Caused cognitive impairment  . Nitrofurantoin Nausea And Vomiting and Other (See Comments)    Also caused hallucinations  . Sulfa Antibiotics Hives  . Sulfonamide Derivatives Hives    Family History  Problem Relation Age of Onset  . Heart disease Mother      Prior to Admission medications   Medication Sig Start Date End Date Taking? Authorizing Provider  acetaminophen (TYLENOL) 500 MG tablet Take 1,000 mg by mouth every 6 (six) hours as needed for mild pain  or fever.    Yes [provider]  Albuterol Sulfate (PROAIR RESPICLICK) [id number] (90 Base) MCG/ACT AEPB Inhale 2 puffs into the lungs daily as needed (for shortness of breath).    Yes [provider]  alendronate (FOSAMAX) 70 MG tablet Take 1 tablet (70 mg total) by mouth once a week. Take with a full glass of water on an empty stomach.mon Patient taking differently: Take 70 mg by mouth every Monday. Take with a full glass of water on an empty stomach 10/08/15  Yes Ilean China  aspirin EC 81 MG tablet Take 81 mg by mouth every evening.    Yes [provider]  cephALEXin (KEFLEX) 250 MG capsule Take 250 mg by mouth every evening.  07/04/19  Yes [provider]  clopidogrel (PLAVIX) 75 MG tablet Take 1 tablet (75 mg total) by mouth daily. 06/06/14  Yes Croitoru, Mihai, MD  Cranberry 500 MG CAPS Take 500 mg by mouth 2 (two) times a day.   Yes [provider]  D-Mannose 500 MG CAPS Take 500 mg by mouth 2 (two) times a day.   Yes [provider]  docusate sodium (COLACE) 100 MG capsule Take 100 mg by mouth 2 (two) times daily.    Yes [provider]  fish oil-omega-3 fatty acids 1000 MG capsule Take 1 g by mouth every evening.    Yes [provider]  isosorbide mononitrate (IMDUR) 30 MG 24 hr tablet Take 0.5 tablets (15 mg total) by mouth daily. 06/28/19 06/22/20 Yes Croitoru, Mihai, MD  levothyroxine (SYNTHROID, LEVOTHROID) 88 MCG tablet Take 1 tablet (88 mcg total) by mouth daily before breakfast. 06/13/17  Yes Rai, Ripudeep K, MD  losartan (COZAAR) 50 MG tablet Take 1 tablet (50 mg total) by mouth 2 (two) times daily. 03/22/19  Yes Croitoru, Mihai, MD  metoprolol succinate (TOPROL-XL) 25 MG 24 hr tablet Take 1 tablet (25 mg total) by mouth daily. 03/22/19  Yes Croitoru, Mihai, MD  Multiple Vitamins-Minerals (ONE-A-DAY PROACTIVE 65+) TABS Take 1 tablet by mouth daily with breakfast.   Yes [provider]  nitroGLYCERIN  (NITROSTAT) 0.4 MG SL tablet Place 1 tablet (0.4 mg total) under the tongue every 5 (five) minutes as needed for chest pain. 03/22/19  Yes Croitoru, Mihai, MD  NON FORMULARY Take 1 tablet by mouth See admin instructions. Bone Strength tablets (D-3, Calcium, K-1, K-2, Vanadium, Silicon, Strontium, Magnesium) Take 2 tablets by mouth in the evening   Yes [provider]  ondansetron (ZOFRAN ODT) 4 MG disintegrating tablet Take 1 tablet (4 mg total) by mouth every 6 (six) hours as needed for nausea or vomiting. 06/12/17  Yes Rai, Ripudeep K, MD  pantoprazole (PROTONIX) 40 MG tablet Take 1 tablet (40 mg total) by mouth daily. 05/15/19  Yes Croitoru, Mihai, MD  Polyethyl Glycol-Propyl Glycol (SYSTANE) 0.4-0.3 % GEL ophthalmic gel Place 1 application into both eyes 2 (two) times a day. Morning and Bedtime   Yes [provider]  Probiotic Product (MEGA PROBIOTIC) CAPS Take 1 capsule by mouth daily with breakfast. Mega Food/MegaFloraWomen's Probiotic   Yes [provider]  rosuvastatin (CRESTOR) 20 MG tablet Take 1 tablet (20 mg total) by mouth daily. 03/22/19  Yes Croitoru, Mihai, MD  sertraline (ZOLOFT) 50 MG tablet Take 1 tablet (50 mg total) by mouth daily. Patient taking differently: Take 50 mg by mouth every evening.  04/09/16  Yes Croitoru, Mihai, MD  Throat Lozenges (IMMUPLEX LOZENGE MT) Take 1 lozenge by mouth 2 (two) times a day. Morning and Evening    Yes [provider]  traMADol (ULTRAM) 50 MG tablet Take one tablet every 8-12 hours as needed for pain. 12/30/18  Yes Lanae Crumbly, PA-C  ipratropium-albuterol (DUONEB) 0.5-2.5 (3) MG/3ML SOLN Take 3 mLs by nebulization every 4 (four) hours as needed. Patient not taking: Reported on 07/05/2019 06/16/16   Theodis Blaze, MD  oxyCODONE (OXY IR/ROXICODONE) 5 MG immediate release tablet Take 1 tablet (5 mg total) by mouth every 4 (four) hours as needed for moderate pain. Patient not taking: Reported on 01/31/2019 12/09/18    Kathie Dike, MD  senna-docusate (SENOKOT-S) 8.6-50 MG tablet Take 1 tablet by mouth 2 (two) times daily. Patient not taking: Reported on 03/22/2019 06/12/17   Rai, Ripudeep  K, MD    Physical Exam: Vitals:   07/05/19 2000 07/05/19 2130 07/05/19 2200 07/05/19 2230  BP: (!) 198/89 (!) 185/75 (!) 189/173 (!) 185/79  Pulse: 68 68 67 69  Resp: 20 (!) 23 (!) 21 (!) 34  Temp:      TempSrc:      SpO2: 94% 94% 97% 95%    Constitutional: NAD, calm, comfortable, nontoxic appearing elderly female laying flat in bed Vitals:   07/05/19 2000 07/05/19 2130 07/05/19 2200 07/05/19 2230  BP: (!) 198/89 (!) 185/75 (!) 189/173 (!) 185/79  Pulse: 68 68 67 69  Resp: 20 (!) 23 (!) 21 (!) 34  Temp:      TempSrc:      SpO2: 94% 94% 97% 95%   Eyes: PERRL, lids and conjunctivae normal ENMT: Mucous membranes are moist.  No dentition.  Neck: normal, supple Respiratory: clear to auscultation anteriorly, unable to do posterior exam as patient could not move without significant pain from fractures.  Patient is tachypneic by wearing facemask and on 2.5 L via nasal cannula cardiovascular: Regular rate and rhythm, no murmurs / rubs / gallops. No extremity edema. 2+ pedal pulses.   Abdomen: no tenderness, no masses palpated.  Bowel sounds positive.  Musculoskeletal: no clubbing / cyanosis. No joint deformity upper and lower extremities.Mild edema of the left knee lateral knee. Normal muscle tone.  Skin: no rashes, lesions, ulcers. No induration.  No ecchymosis or petechiae. Neurologic: Alert oriented only to self and location.  Able to follow commands but unable to answer any complex questions.  CN 2-12 grossly intact. Did not tolerate movement of left or right lower extremity secondary to pain.  She was able to dorsi and plantarflex bilateral foot.  Had intact sensation.   Psychiatric: Alert and oriented only to self and location.  Pleasant mood.   Labs on Admission: I have personally reviewed following labs and  imaging studies  CBC: Recent Labs  Lab 07/05/19 2115  WBC 13.4*  NEUTROABS 10.5*  HGB 11.8*  HCT 36.3  MCV 98.6  PLT [id number]   Basic Metabolic Panel: Recent Labs  Lab 07/05/19 2115  NA 136  K 6.2*  CL 98  CO2 27  GLUCOSE 114*  BUN 14  CREATININE 0.70  CALCIUM 9.0   GFR: CrCl cannot be calculated (Unknown ideal weight.). Liver Function Tests: No results for input(s): AST, ALT, ALKPHOS, BILITOT, PROT, ALBUMIN in the last 168 hours. No results for input(s): LIPASE, AMYLASE in the last 168 hours. No results for input(s): AMMONIA in the last 168 hours. Coagulation Profile: No results for input(s): INR, PROTIME in the last 168 hours. Cardiac Enzymes: No results for input(s): CKTOTAL, CKMB, CKMBINDEX, TROPONINI in the last 168 hours. BNP (last 3 results) No results for input(s): PROBNP in the last 8760 hours. HbA1C: No results for input(s): HGBA1C in the last 72 hours. CBG: No results for input(s): GLUCAP in the last 168 hours. Lipid Profile: No results for input(s): CHOL, HDL, LDLCALC, TRIG, CHOLHDL, LDLDIRECT in the last 72 hours. Thyroid Function Tests: No results for input(s): TSH, T4TOTAL, FREET4, T3FREE, THYROIDAB in the last 72 hours. Anemia Panel: No results for input(s): VITAMINB12, FOLATE, FERRITIN, TIBC, IRON, RETICCTPCT in the last 72 hours. Urine analysis:    Component Value Date/Time   COLORURINE YELLOW 12/06/2018 1824   APPEARANCEUR CLEAR 12/06/2018 1824   LABSPEC 1.018 12/06/2018 1824   PHURINE 6.0 12/06/2018 1824   GLUCOSEU NEGATIVE 12/06/2018 1824   HGBUR NEGATIVE 12/06/2018 1824  [place] NEGATIVE 12/06/2018 [place] 12/06/2018 1824   PROTEINUR NEGATIVE 12/06/2018 1824   UROBILINOGEN 0.2 07/12/2012 1448   NITRITE NEGATIVE 12/06/2018 1824   LEUKOCYTESUR NEGATIVE 12/06/2018 1824    Radiological Exams on Admission: DG Ankle Complete Left  Result Date: 07/05/2019 CLINICAL DATA:  Left ankle pain, fell, lateral swelling EXAM:  LEFT ANKLE COMPLETE - 3+ VIEW COMPARISON:  None. FINDINGS: Frontal, oblique, and lateral views of the left ankle are obtained. There is a small avulsion fracture at the tip the lateral malleolus. Mild overlying soft tissue edema. There is diffuse osteoarthritis of the ankle, hindfoot, and midfoot. Prominent calcaneal spurs are noted. IMPRESSION: 1. Small avulsion fracture off the tip of the lateral malleolus. 2. Diffuse osteoarthritis. Electronically Signed   By: Randa Ngo M.D.   On: 07/05/2019 23:01   CT Head Wo Contrast  Result Date: 07/05/2019 CLINICAL DATA:  Fall.  On Plavix. EXAM: CT HEAD WITHOUT CONTRAST TECHNIQUE: Contiguous axial images were obtained from the base of the skull through the vertex without intravenous contrast. COMPARISON:  12/06/2018 FINDINGS: Brain: There is atrophy and chronic small vessel disease changes. No acute intracranial abnormality. Specifically, no hemorrhage, hydrocephalus, mass lesion, acute infarction, or significant intracranial injury. Vascular: No hyperdense vessel or unexpected calcification. Skull: No acute calvarial abnormality. Sinuses/Orbits: No acute findings Other: None IMPRESSION: Atrophy, chronic microvascular disease. No acute intracranial abnormality. Electronically Signed   By: Rolm Baptise M.D.   On: 07/05/2019 20:35   CT Angio Chest PE W and/or Wo Contrast  Addendum Date: 07/05/2019   ADDENDUM REPORT: 07/05/2019 23:34 ADDENDUM: Ascending thoracic aortic dilatation up to 4 cm. Recommend annual imaging followup by CTA or MRA. This recommendation follows 2010 ACCF/AHA/AATS/ACR/ASA/SCA/SCAI/SIR/STS/SVM Guidelines for the Diagnosis and Management of Patients with Thoracic Aortic Disease. Circulation. 2010; [id number]. Aortic aneurysm NOS (ICD10-I71.9) This addendum was called by telephone at the time of interpretation on 07/05/2019 at 11:34 pm to provider [facility] , who verbally acknowledged these results. Electronically Signed   By: Lovena Le M.D.   On: 07/05/2019 23:34   Result Date: 07/05/2019 CLINICAL DATA:  Concern for PE, hypoxia and shortness of breath EXAM: CT ANGIOGRAPHY CHEST WITH CONTRAST TECHNIQUE: Multidetector CT imaging of the chest was performed using the standard protocol during bolus administration of intravenous contrast. Multiplanar CT image reconstructions and MIPs were obtained to evaluate the vascular anatomy. CONTRAST:  18mL OMNIPAQUE IOHEXOL 350 MG/ML SOLN COMPARISON:  CT 06/12/2016 FINDINGS: Cardiovascular: Satisfactory opacification of the pulmonary arteries without large central or lobar filling defects. More distal evaluation is limited by respiratory motion and cardiac pulsation artifact. Mild cardiomegaly. Extensive coronary artery calcifications and postsurgical changes related to prior CABG. No pericardial effusion. Borderline dilation of the ascending thoracic aorta measuring up to 4 cm in diameter. Atherosclerotic calcification present within the thoracic aorta and normally branching proximal great vessels. Mediastinum/Nodes: Postsurgical changes in the anterior mediastinum likely related prior CABG. There is a moderate hiatal hernia. The thoracic esophagus is fluid-filled and patulous. Tortuosity of the trachea is similar to comparison studies. Posterior bowing likely related to imaging during exhalation. Mild diffuse airways thickening is noted. Thyroid gland is diminutive which may reflect prior surgery or radioactive iodine therapy. Lungs/Pleura: Evaluation of the lung parenchyma is limited due to extensive respiratory motion artifact. Subpleural predominant reticular changes are better assessed on comparison exam with diffuse airways thickening and bronchiectatic changes. Mild bronchiectatic changes and mucous plugging. No focal consolidative opacity is seen. No pneumothorax or effusion. Upper  Abdomen: No acute abnormalities present in the visualized portions of the upper abdomen. Of enlarging fluid  attenuation cyst in the upper pole left kidney now measuring up to 6 cm in diameter (previously 4.6 cm. No worrisome renal lesion is seen however. Reflux of contrast into the hepatic veins may reflect elevated right heart pressures. Musculoskeletal: Exaggerated thoracic kyphosis. Multilevel discogenic and facet degenerative changes are present. Prior vertebroplasty changes at T10 are similar to prior with small amount of cement within the T9-T10 disc space. Remote right third anterior rib deformity. Increased AP diameter of the chest. Additional degenerative changes noted in the shoulders. No suspicious osseous lesions. Review of the MIP images confirms the above findings. IMPRESSION: 1. No evidence of acute central or lobar pulmonary embolism. More distal evaluation is limited by respiratory motion and cardiac pulsation artifact. 2. Chronic interstitial lung disease with associated airways thickening and bronchiectatic changes. No focal consolidative opacity to suggest pneumonia. 3. Moderate hiatal hernia with fluid-filled and patulous thoracic esophagus, correlate with features of reflux. 4. Cardiomegaly. Reflux of contrast into the hepatic veins may reflect elevated right heart pressures. 5. Aortic Atherosclerosis (ICD10-I70.0). Electronically Signed: By: Lovena Le M.D. On: 07/05/2019 23:08   CT Cervical Spine Wo Contrast  Result Date: 07/05/2019 CLINICAL DATA:  Fall EXAM: CT CERVICAL SPINE WITHOUT CONTRAST TECHNIQUE: Multidetector CT imaging of the cervical spine was performed without intravenous contrast. Multiplanar CT image reconstructions were also generated. COMPARISON:  06/08/2017 FINDINGS: Alignment: Normal Skull base and vertebrae: No acute fracture. No primary bone lesion or focal pathologic process. Soft tissues and spinal canal: No prevertebral fluid or swelling. No visible canal hematoma. Disc levels: Moderate to advanced diffuse degenerative facet disease, left greater than right. Disc spaces  maintained. Mild anterior spurring Upper chest: No acute findings Other: None IMPRESSION: Degenerative disc and advanced degenerative facet disease. No acute bony abnormality. Electronically Signed   By: Rolm Baptise M.D.   On: 07/05/2019 20:37   DG Chest Portable 1 View  Result Date: 07/05/2019 CLINICAL DATA:  Fall.  Left hip and knee pain EXAM: PORTABLE CHEST 1 VIEW COMPARISON:  12/06/2018 FINDINGS: Prior CABG. Low lung volumes. No confluent opacities, effusions or edema. Heart is normal size. No acute bony abnormality. IMPRESSION: No active disease. Electronically Signed   By: Rolm Baptise M.D.   On: 07/05/2019 20:29   DG Knee Complete 4 Views Left  Result Date: 07/05/2019 CLINICAL DATA:  Fall.  Left knee pain EXAM: LEFT KNEE - COMPLETE 4+ VIEW COMPARISON:  None. FINDINGS: There is a nondisplaced fracture through the proximal left fibular metaphysis. No additional fracture seen. For moderate to large joint effusion. Joint spaces maintained. IMPRESSION: Left fibular neck fracture. Moderate to large joint effusion. Electronically Signed   By: Rolm Baptise M.D.   On: 07/05/2019 20:28   DG Hip Unilat W or Wo Pelvis 2-3 Views Left  Result Date: 07/05/2019 CLINICAL DATA:  Fall, left hip pain EXAM: DG HIP (WITH OR WITHOUT PELVIS) 2-3V LEFT COMPARISON:  01/31/2019 FINDINGS: Patient is status post left hip replacement. Old healed left inferior pubic ramus fracture. No acute fracture, subluxation or dislocation. No hardware complicating feature. IMPRESSION: Left hip replacement. No acute bony abnormality. Electronically Signed   By: Rolm Baptise M.D.   On: 07/05/2019 20:28    EKG: Independently reviewed.   Assessment/Plan  Altered mental status  unclear source- CT head negative pain managment for fractures check urine culture- pt recently treated with UTI and chronically on Keflex Check VBG given  new hypoxia delirium precaution - pt at high risk from new AMS and pain due to fractures  Speech  evaluation for diet  Acute hypoxic respiratory failure possibly acute on chronic worsening of ILD seen on CTA chest no PE, no findings on imaging or exam to suggest acute CHF WBC mildly elevated but no fever or other symptoms to suggest infectious cause  will continued duoneb q6hr and monitor. Consider repeat CXR if no improvement   small avulsion fracture off the tip of the left lateral malleolus  Left fibular neck fracture with moderate to large joint effusion s/p mechanical fall from weakness Ortho consulted by ED physician  recommended weight bearing as tolerated Knee and ankle immobilizer  PT/OT   Hyperkalemia K of 6.2 from prior normal. No EKG changes. unclear cause- check CPK due to recent fall/trauma received  insulin 5 units and D50 in the ED will repeat stat potassium level  Add Kayexalate  Leukocytosis reactive? no other signs or symptoms of infection  Chronic UTI check urine culture continue Keflex  Chronic anemia Stable at Hgb of 11.8 around baseline of 11  Ascending thoracic aortic dilatation Incidential finding of CTA chest 4cm recommends annual follow up by CTA or MRA   Hx of CVA  CT head negative and no focal neurological deficit continue aspirin and plavix  Hx of CAD s/p CABG  continue Imdur, losartan, metoprolol  Anxiety continue Zoloft  Hypothyroidism Continue levothyroxine Check TSH  DVT prophylaxis:.Lovenox Code Status: DNR-paperwork at bedside Family Communication: Plan discussed with granddaughter at bedside  disposition Plan: ALF with at least 2 midnight stays given altered mental status, new oxygen requirement, multiple fractures requiring further workup and PT/OT evaluation. Patient also at risk for acute delirium.  Consults called:  Admission status: inpatient   Camari Wisham T Morey Andonian DO [facility]   If 7PM-7AM, please contact night-coverage [email or web address]   07/06/2019, 12:32 AM

## 2019-07-06 NOTE — Evaluation (Signed)
Clinical/Bedside Swallow Evaluation Patient Details  Name: Gina Bowers MRN: [id number] Date of Birth: [birth year]  Today's Date: 07/06/2019 Time: SLP Start Time (ACUTE ONLY): 1340 SLP Stop Time (ACUTE ONLY): 1400 SLP Time Calculation (min) (ACUTE ONLY): 20 min  Past Medical History:  Past Medical History:  Diagnosis Date  . Arthritis   . Blood transfusion 2007   AFTER HIP REPLACEMENT  . Carotid bruit    PT'S DAUGHTER STATES PT HAD RECENT CAROTID STUDY AND WAS TOLD NO SIGNIFICANT BLOCKAGES  . Chronic diastolic CHF (congestive heart failure) ([place])    a. 07/2015 Ech: Ef 60-65%, Gr1 DD.  Marland Kitchen Coronary artery disease    a. 2009 CABG 4, Dr. Servando Snare (LIMA to LAD , SVG to diagonal, SVG to circumflex, SVG to PDA); b. Cath 2012 in  Recent ill,[place] patent LIMA , Patent SVG to diagonal, occluded SVG to RCA. 5x9mm BMS->native RCA; c. 09/2015 Cath: LM nl, LAD 180m, D1 100, LCX small, OM1 90/small, OM2 70, RCA 45p, patent stent, VG->dRCA 100, VG->OM1 100, VG->Diag nl, LIMA->LAD nl->Med Rx.  . Depression   . Fracture FEB 2013   FRACTURED LEFT ANKLE--NO SURGERY--WAS IN BOOT UNTIL COUPLE WEEKS AGO  . GERD (gastroesophageal reflux disease)   . High cholesterol   . Hypothyroidism   . Ischemic colitis ([place]) 2010  . Kidney stones    IN THE PAST  . 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 [place] virus    COUPLE OF WEEKS AGO--ALL SYMTOMS RESOLVED PER PT  . Obstructive sleep apnea 2010  . Shortness of breath    AND WHEEZING AT TIMES--NO INHALERS  . Stroke ([place])    SEVERAL STROKES -TOLD SHE HAS CEREBELLUM BLOCKAGE AS RESULT OF STROKE--BUT NEUROLOGIST SAID HE DID NOT WANT TO DO ANY PROCEDURE BECAUSE OF HER AGE .  PT HAS SLIGHT LEFT FOOT DROP-DRAGS FOOT WHEN WALKING - USES WALKER  . Urinary incontinence   . UTI (lower urinary tract infection)    HX OF UTI'S-- LAST TIME COUPLE OF MONTHS AGO  . Vertigo    Past Surgical History:  Past Surgical History:  Procedure Laterality Date   . ABDOMINAL HYSTERECTOMY    . APPENDECTOMY    . [place] AND 1990  . CARDIAC CATHETERIZATION N/A 10/07/2015   Procedure: Left Heart Cath and Cors/Grafts Angiography;  Surgeon: Peter M Martinique, MD;  Location: [facility];  Service: Cardiovascular;  Laterality: N/A;  . CATARACT EXTRACTION    . CORONARY ANGIOPLASTY  10/2010   WITH STENT PLACEMENT  . CORONARY ARTERY BYPASS GRAFT  2009  . CYSTOSCOPY WITH INJECTION  09/21/2011   Procedure: CYSTOSCOPY WITH INJECTION;  Surgeon: Ailene Rud, MD;  Location: [facility];  Service: Urology;  Laterality: N/A;  Peri Urethral Macroplastique Injection and Estring Placement  . HERNIA REPAIR    . HIP SURGERY    . JOINT REPLACEMENT  2007   HIP REPLACEMENT -LEFT  . TONSILLECTOMY     HPI:  86 yo adm [facility] after fall with left fibular fx and lateral malleolus avulsion fracture.  Ortho recommended immobilzation and PT/OT- WBAT.  Pt also with h/o multiple CVAs  cerebellar blockage, recent norwalk virus, GERD, UTI.  Pt resides with her granddaughter and granddaughter reported a decline in the last 3 weeks.  Acute hypoxic respiratory failure noted with her needing 2.5 L oxygen, currently she is on RA.  CT chest showed moderate hiatal hernia, dilated fluid filled  thoracic esophagus concerning for reflux, mild bronchectasis and mucous plugging. Pt denies dysphagia  Mild anterior spurring noted on cervical spine imaging.   Assessment / Plan / Recommendation Clinical Impression  Pt with functional oropharyngeal swallow based on clinical swallow evaluation.  Vagus nerve impacted evidenced by palatal deviation to left upon phonation.  Pt with mildly decreased strength of voice which could be due to 86.  She was able to feed herself and did not demonstrate any s/s of aspiration. Swallowing with liquids was swift and adequate mastication noted with solids.  She denies issues with swallow - but does likley have reflux based on recent CT chest showing  dilated fluid filled esophagus and also a hiatal hernia.  Recommend continue diet as tolerated.  No SLP follow up indicated. SLP Visit Diagnosis: Dysphagia, unspecified (R13.10)    Aspiration Risk  Mild aspiration risk    Diet Recommendation Regular;Thin liquid   Liquid Administration via: Cup;Straw Medication Administration: Whole meds with liquid Supervision: Patient able to self feed Compensations: Slow rate;Small sips/bites Postural Changes: Seated upright at 90 degrees;Remain upright for at least 30 minutes after po intake    Other  Recommendations Oral Care Recommendations: Oral care BID   Follow up Recommendations None      Frequency and Duration     n/a       Prognosis   n/a    Swallow Study   General Date of Onset: 07/06/19 HPI: 86 yo adm [facility] afterr fall with left fibular fx and lateral malleolus avulsion fracture.  Ortho recommended immobilzation and PT/OT- WBAT.  Pt also with h/o multiple CVAs  cerebellar blockage, recent norwalk virus, GERD, UTI.  Pt resides with her granddaughter and granddaughter reported a decline in the last 3 weeks.  Acute hypoxic respiratory failure noted with her needing 2.5 L oxygen, currently she is on RA.  CT chest showed moderate hiatal hernia, dilated fluid filled thoracic esophagus concerning for reflux, mild bronchectasis and mucous plugging. Pt denies dysphagia  Mild anterior spurring noted on cervical spine imaging. Type of Study: Bedside Swallow Evaluation Diet Prior to this Study: Regular;Thin liquids Oral Care Completed by SLP: No(pt eating) Oral Cavity - Dentition: Dentures, top;Adequate natural dentition Vision: Functional for self-feeding Self-Feeding Abilities: Able to feed self Patient Positioning: Upright in bed Baseline Vocal Quality: Low vocal intensity Volitional Cough: Strong Volitional Swallow: Able to elicit    Oral/Motor/Sensory Function Overall Oral Motor/Sensory Function: Other (comment)(uvula deviation to left  upon phonation)   Ice Chips Ice chips: Not tested   Thin Liquid Thin Liquid: Within functional limits Presentation: Straw;Self Fed    Nectar Thick Nectar Thick Liquid: Not tested   Honey Thick Honey Thick Liquid: Not tested   Puree Puree: Within functional limits Presentation: Self Fed;Spoon Other Comments: pt takes small bites   Solid     Solid: Within functional limits Presentation: Self Fed Other Comments: prolonged mastication but functional      Macario Golds 07/06/2019,2:18 PM   Kathleen Lime, MS [place] Office [phone]

## 2019-07-06 NOTE — Progress Notes (Signed)
Nutrition Brief Note RD working remotely.   Patient identified on the Malnutrition Screening Tool (MST) Report  Wt Readings from Last 15 Encounters:  07/06/19 74.6 kg  03/22/19 71.9 kg  01/31/19 72.6 kg  01/03/19 76.2 kg  12/13/18 76.2 kg  12/07/18 76.4 kg  01/31/18 77.6 kg  01/20/18 79 kg  07/30/17 76.7 kg  07/02/17 72.6 kg  06/08/17 78 kg  07/21/16 78.4 kg  06/19/16 80.6 kg  04/20/16 78.9 kg  03/09/16 80.3 kg    Body mass index is 31.08 kg/m. Patient meets criteria for obesity based on current BMI. Current weight is 164 lb and weight on 01/03/19 was 168 lb. This indicates 4 lb weight loss (2.4% body weight) in the past 6 months; not significant for time frame.    Flow sheet documentation indicates that patient is a/o to self only. Notes indicate that family reported patient had a good appetite PTA.   Current diet order is Heart Healthy. No intakes documented since admission. SLP worked with patient earlier this afternoon and recommended regular texture foods and thin liquids.   Ensure Enlive ordered BID per ONS protocol and patient accepted bottle of this supplement offered to her this AM. Will maintain this order. Labs and medications reviewed.   No nutrition interventions warranted at this time. If nutrition issues arise, please consult RD.      Jarome Matin, MS, RD, LDN, CNSC Inpatient Clinical Dietitian RD pager # available in [place]  After hours/weekend pager # available in [facility]

## 2019-07-06 NOTE — Progress Notes (Signed)
Called and updated patient's daughter over phone. All questions answered. Of note, also discussed case with pharmacy. After reviewing prior urine cx results, have transitioned pt to empiric ampicillin to cover enterococcus as pt has a hx of recurrent enterococcus UTI. Will f/u on urine cx.

## 2019-07-06 NOTE — Evaluation (Signed)
Physical Therapy Evaluation Patient Details Name: Gina Bowers MRN: [id number] DOB: February 23, 1926 Today's Date: 07/06/2019   History of Present Illness  86 yo female admitted to ED for fall on 2/10. Xray L knee reveals L fibular neck fracture with joint effusion, as well as small avulsion fracture at tip of lateral malleolus. CT head and neck negative for acute findings, L hip negative for fracture. PMH includes CAD s/p CABG, PAD, chronic diastolic CHF, hypertension, hyperlipidemia, hypothyroidism, CVA on Plavix, left hip replacement due to fall.  Clinical Impression   Pt presents with LE weakness, L knee pain, cognitive impairment with recent UTI, difficulty performing bed mobility and transfers, and decreased activity tolerance. Pt to benefit from acute PT to address deficits. Pt required mod-max +2 for bed mobility and transfer to semi-standing position, unable to achieve full standing with use of stedy or RW. PT recommending SNF level of care, but per pt's granddaughter pt's 24/7 caregiver will be able to maximally physically assist her if needed and they would prefer her to d/c back to ALF. PT to progress mobility as tolerated, and will continue to follow acutely.      Follow Up Recommendations SNF;Home health PT;Supervision/Assistance - 24 hour    Equipment Recommendations  Other (comment)(defer to next venue)    Recommendations for Other Services       Precautions / Restrictions Precautions Precautions: Fall Required Braces or Orthoses: Knee Immobilizer - Left Knee Immobilizer - Left: Other (comment)(for pt comfort) Restrictions Weight Bearing Restrictions: No LLE Weight Bearing: Weight bearing as tolerated      Mobility  Bed Mobility Overal bed mobility: Needs Assistance Bed Mobility: Supine to Sit;Sit to Supine     Supine to sit: Mod assist;HOB elevated Sit to supine: Max assist;HOB elevated   General bed mobility comments: mod-max assist for supine<>sit for LE  management, trunk elevation/lowering, and scooting pt to and from EOB. PT with use of boost function to reposition pt in bed upon return to supine.  Transfers Overall transfer level: Needs assistance Equipment used: Rolling walker (2 wheeled);Ambulation equipment used Transfers: Sit to/from Stand Sit to Stand: Max assist;+2 safety/equipment;From elevated surface;+2 physical assistance         General transfer comment: Max assist +2 for power up, steadying, facilitation of hip extension. Sit to stand x2, once with RW and once with stedy but unable to come to full standing on both attempts.  Ambulation/Gait             General Gait Details: unable to assess  Stairs            Wheelchair Mobility    Modified Rankin (Stroke Patients Only)       Balance Overall balance assessment: Needs assistance;History of Falls Sitting-balance support: No upper extremity supported;Feet supported Sitting balance-Leahy Scale: Fair Sitting balance - Comments: able to sit EOB without PT assist Postural control: Left lateral lean Standing balance support: Bilateral upper extremity supported;During functional activity Standing balance-Leahy Scale: Zero Standing balance comment: max +2 for semi-standing position                             Pertinent Vitals/Pain Pain Assessment: No/denies pain    Home Living Family/patient expects to be discharged to:: Assisted living               Home Equipment: Walker - 4 wheels;Shower seat;Hand held shower head;Grab bars - tub/shower;Grab bars - toilet Additional Comments: [place]  Prior Function Level of Independence: Needs assistance   Gait / Transfers Assistance Needed: uses rollator for ambulation  ADL's / Homemaking Assistance Needed: 24/7 caregiver assists pt with bathing and dressing, meals delivered to her        Hand Dominance   Dominant Hand: Right    Extremity/Trunk Assessment   Upper  Extremity Assessment Upper Extremity Assessment: Defer to OT evaluation    Lower Extremity Assessment Lower Extremity Assessment: Generalized weakness;LLE deficits/detail LLE Deficits / Details: very limited by pain - quad, hamstring, DF/PF contraction observed during AAROM LLE: Unable to fully assess due to pain    Cervical / Trunk Assessment Cervical / Trunk Assessment: Kyphotic  Communication   Communication: HOH  Cognition Arousal/Alertness: Awake/alert Behavior During Therapy: WFL for tasks assessed/performed Overall Cognitive Status: Impaired/Different from baseline Area of Impairment: Orientation;Following commands;Safety/judgement;Problem solving                 Orientation Level: Disoriented to;Place;Time;Situation     Following Commands: Follows one step commands with increased time Safety/Judgement: Decreased awareness of safety;Decreased awareness of deficits   Problem Solving: Slow processing;Decreased initiation;Difficulty sequencing;Requires verbal cues;Requires tactile cues General Comments: Very slow processing, increased time to respond to PT questions and verbal cuing during mobility. Pt A&O x1 to self, does not know she is in [facility] in [place] and could not state injury.      General Comments      Exercises     Assessment/Plan    PT Assessment Patient needs continued PT services  PT Problem List         PT Treatment Interventions DME instruction;Therapeutic activities;Gait training;Therapeutic exercise;Patient/family education;Balance training;Stair training;Functional mobility training;Neuromuscular re-education    PT Goals (Current goals can be found in the Care Plan section)  Acute Rehab PT Goals Patient Stated Goal: d/c back to ALF PT Goal Formulation: With patient/family Time For Goal Achievement: 07/20/19 Potential to Achieve Goals: Good    Frequency Min 3X/week   Barriers to discharge        Co-evaluation                AM-PAC PT "6 Clicks" Mobility  Outcome Measure Help needed turning from your back to your side while in a flat bed without using bedrails?: A Lot Help needed moving from lying on your back to sitting on the side of a flat bed without using bedrails?: A Lot Help needed moving to and from a bed to a chair (including a wheelchair)?: Total Help needed standing up from a chair using your arms (e.g., wheelchair or bedside chair)?: Total Help needed to walk in hospital room?: Total Help needed climbing 3-5 steps with a railing? : Total 6 Click Score: 8    End of Session Equipment Utilized During Treatment: Gait belt;Left knee immobilizer Activity Tolerance: Patient limited by fatigue;Patient limited by pain Patient left: in bed;with call bell/phone within reach;with bed alarm set;with family/visitor present;with nursing/sitter in room Nurse Communication: Mobility status PT Visit Diagnosis: Other abnormalities of gait and mobility (R26.89);Muscle weakness (generalized) (M62.81);Pain Pain - Right/Left: Left Pain - part of body: Knee    Time: 1520-1550 PT Time Calculation (min) (ACUTE ONLY): 30 min   Charges:   PT Evaluation $PT Eval Low Complexity: 1 Low PT Treatments $Therapeutic Activity: 8-22 mins       Wiley Magan E, PT Acute Rehabilitation Services Pager [phone]  Office [phone]   Shontay Wallner D Shaquile Lutze 07/06/2019, 5:30 PM

## 2019-07-06 NOTE — TOC Initial Note (Signed)
Transition of Care [facility]) - Initial/Assessment Note    Patient Details  Name: Gina Bowers MRN: [id number] Date of Birth: April 15, 1926  [facility]) CM/SW Contact:    Shade Flood, LCSW Phone Number: 07/06/2019, 3:23 PM  Clinical Narrative:                  Pt admitted from [facility]. Per MD, pt has a UTI and she fell at her ALF. PT/OT evaluations pending. MD anticipating pt is likely to need SNF rehab. TOC will follow and assist in coordinating appropriate dc plan.  Expected Discharge Plan: Skilled Nursing Facility Barriers to Discharge: Continued Medical Work up   Patient Goals and CMS Choice        Expected Discharge Plan and Services Expected Discharge Plan: [place]       Living arrangements for the past 2 months: [place]                                      Prior Living Arrangements/Services Living arrangements for the past 2 months: [place] Lives with:: Facility Resident Patient language and need for interpreter reviewed:: Yes Do you feel safe going back to the place where you live?: Yes      Need for Family Participation in Patient Care: No (Comment) Care giver support system in place?: Yes (comment)   Criminal Activity/Legal Involvement Pertinent to Current Situation/Hospitalization: No - Comment as needed  Activities of Daily Living Home Assistive Devices/Equipment: Other (Comment) ADL Screening (condition at time of admission) Patient's cognitive ability adequate to safely complete daily activities?: Yes Is the patient deaf or have difficulty hearing?: No Does the patient have difficulty seeing, even when wearing glasses/contacts?: No Does the patient have difficulty concentrating, remembering, or making decisions?: Yes Patient able to express need for assistance with ADLs?: Yes Does the patient have difficulty dressing or bathing?: Yes Independently performs ADLs?:  No Communication: Independent Dressing (OT): Needs assistance Is this a change from baseline?: Pre-admission baseline Grooming: Needs assistance Is this a change from baseline?: Pre-admission baseline Feeding: Independent Bathing: Needs assistance Is this a change from baseline?: Pre-admission baseline Toileting: Independent In/Out Bed: Needs assistance Is this a change from baseline?: Pre-admission baseline Walks in Home: Needs assistance Is this a change from baseline?: Pre-admission baseline Does the patient have difficulty walking or climbing stairs?: Yes Weakness of Legs: Both Weakness of Arms/Hands: None  Permission Sought/Granted                  Emotional Assessment       Orientation: : Oriented to Self Alcohol / Substance Use: Not Applicable Psych Involvement: No (comment)  Admission diagnosis:  Fall [W19.XXXA] Dilatation of thoracic aorta (HCC) [I77.810] Avulsion fracture of distal fibula [S82.839A] Closed fracture of proximal end of left fibula, unspecified fracture morphology, initial encounter [id number] Patient Active Problem List   Diagnosis Date Noted  . Dilatation of thoracic aorta ([place]) 07/06/2019  . Fall 07/06/2019  . Altered mental status 07/06/2019  . Hyperkalemia 07/06/2019  . Avulsion fracture of lateral malleolus 07/06/2019  . Left fibular fracture 07/06/2019  . Chronic UTI 07/06/2019  . Hx of CABG 07/06/2019  . History of CVA (cerebrovascular accident) 07/06/2019  . Hip fracture due to osteoporosis, initial encounter ([place]) 01/03/2019  . Closed fracture of left hip ([place])   . Closed displaced fracture of greater trochanter of  left femur ([place]) 12/06/2018  . Pubic ramus fracture ([place]) 06/08/2017  . Acetabulum fracture ([place]) 06/08/2017  . Fall at home, initial encounter 06/08/2017  . UTI (urinary tract infection) 06/10/2016  . Acute pyelonephritis   . SIRS (systemic inflammatory response syndrome) (HCC)   . Epistaxis 03/09/2016  . Ocular  migraine 11/04/2015  . Essential hypertension 11/04/2015  . HLD (hyperlipidemia) 11/04/2015  . Coronary artery disease involving coronary bypass graft of native heart 11/04/2015  . Fatigue 10/08/2015  . Urinary tract infection 10/05/2015  . Chest pain 10/04/2015  . Hypertension 10/04/2015  . Unstable angina ([place])   . Hypertensive heart disease without heart failure   . S/P CABG 2009-cath 10/07/15-medical Rx   . S/P coronary artery stent placement-2012   . Acute CVA (cerebrovascular accident) ([place]) 08/17/2015  . Possible Renovascular hypertension 07/25/2015  . Hyperlipidemia 07/25/2015  . History of stroke-March 2017 (rt brain) 07/25/2015  . Vertebrobasilar artery stenosis 07/25/2015  . CAD (coronary artery disease) of artery bypass graft 06/06/2014  . Diastolic dysfunction-grade 1 with EF 60% 06/06/2014  . CVA (cerebral vascular accident) ([place]) 11/17/2011  . TIA post cath 10/07/15 11/13/2011  . Weakness 11/13/2011  . Obstructive sleep apnea 11/23/2008  . PULMONARY NODULE 11/23/2008  . Hypoxemia 11/23/2008  . Hypothyroidism 11/22/2008  . Anxiety state 11/22/2008  . Cerebral artery occlusion with cerebral infarction ([place]) 11/22/2008   PCP:  Lavone Orn, MD Pharmacy:   [facility] [address], [place] - [place] [address] Phone: [phone] Fax: [phone]     Social Determinants of Health (SDOH) Interventions    Readmission Risk Interventions Readmission Risk Prevention Plan 07/06/2019  Transportation Screening Complete  Medication Review (RN CM) Complete  Some recent data might be hidden

## 2019-07-06 NOTE — ED Notes (Signed)
Report called to nurse Benjamine Mola for pt transfer to the floor

## 2019-07-07 ENCOUNTER — Encounter (HOSPITAL_COMMUNITY): Payer: Self-pay | Admitting: Family Medicine

## 2019-07-07 LAB — COMPREHENSIVE METABOLIC PANEL
ALT: 16 U/L (ref 0–44)
AST: 19 U/L (ref 15–41)
Albumin: 2.9 g/dL — ABNORMAL LOW (ref 3.5–5.0)
Alkaline Phosphatase: 45 U/L (ref 38–126)
Anion gap: 8 (ref 5–15)
BUN: 15 mg/dL (ref 8–23)
CO2: 30 mmol/L (ref 22–32)
Calcium: 8.4 mg/dL — ABNORMAL LOW (ref 8.9–10.3)
Chloride: 95 mmol/L — ABNORMAL LOW (ref 98–111)
Creatinine, Ser: 0.42 mg/dL — ABNORMAL LOW (ref 0.44–1.00)
GFR calc Af Amer: >60 mL/min (ref >60)
GFR calc non Af Amer: >60 mL/min (ref >60)
Glucose, Bld: 137 mg/dL — ABNORMAL HIGH (ref 70–99)
Potassium: 3.3 mmol/L — ABNORMAL LOW (ref 3.5–5.1)
Sodium: 133 mmol/L — ABNORMAL LOW (ref 135–145)
Total Bilirubin: 0.9 mg/dL (ref 0.3–1.2)
Total Protein: 6.1 g/dL — ABNORMAL LOW (ref 6.5–8.1)

## 2019-07-07 LAB — CBC
HCT: 32.2 % — ABNORMAL LOW (ref 36.0–46.0)
Hemoglobin: 10.3 g/dL — ABNORMAL LOW (ref 12.0–15.0)
MCH: 31.1 pg (ref 26.0–34.0)
MCHC: 32 g/dL (ref 30.0–36.0)
MCV: 97.3 fL (ref 80.0–100.0)
Platelets: 228 10*3/uL (ref 150–400)
RBC: 3.31 MIL/uL — ABNORMAL LOW (ref 3.87–5.11)
RDW: 12.4 % (ref 11.5–15.5)
WBC: 11.5 10*3/uL — ABNORMAL HIGH (ref 4.0–10.5)
nRBC: 0 % (ref 0.0–0.2)

## 2019-07-07 LAB — URINE CULTURE: Culture: 70000 — AB

## 2019-07-07 MED ORDER — FOSFOMYCIN TROMETHAMINE 3 G PO PACK
3.0000 g | PACK | Freq: Once | ORAL | Status: AC
  Administered 2019-07-07: 3 g via ORAL
  Filled 2019-07-07: qty 3

## 2019-07-07 MED ORDER — ISOSORBIDE MONONITRATE ER 30 MG PO TB24
30.0000 mg | ORAL_TABLET | Freq: Every day | ORAL | Status: DC
  Administered 2019-07-07 – 2019-07-09 (×3): 30 mg via ORAL
  Filled 2019-07-07 (×3): qty 1

## 2019-07-07 MED ORDER — LABETALOL HCL 5 MG/ML IV SOLN: 5.0000 mg | INTRAVENOUS | Status: DC | PRN

## 2019-07-07 NOTE — Evaluation (Signed)
Occupational Therapy Evaluation Patient Details Name: Gina Bowers MRN: [id number] DOB: [birth year] Today's Date: 07/07/2019    History of Present Illness 86 yo female admitted to ED for fall on 2/10. Xray L knee reveals L fibular neck fracture with joint effusion, as well as small avulsion fracture at tip of lateral malleolus. CT head and neck negative for acute findings, L hip negative for fracture. PMH includes CAD s/p CABG, PAD, chronic diastolic CHF, hypertension, hyperlipidemia, hypothyroidism, CVA on Plavix, left hip replacement due to fall.   Clinical Impression   Pt was admitted for the above. At baseline, she lives at [facility] and has 24/7 caregivers. Pt is unable to stand with total +2 assist at this time.  Family wants pt to return to her previous living situation. Pt could be assisted with adls at a bed level.  A hospital bed would allow pt to assist with repositioning and rolling for adls.  They could also consider a mechanical lift; usually 2 caregivers are needed for safety with this. Will follow in acute setting working on sitting and standing to support adls    Follow Up Recommendations  SNF(vs 24/7 and HHOT)    Equipment Recommendations  (? BSC, pt not ready yet, unless a lift is used. ?hospital bed to maximize)    Recommendations for Other Services       Precautions / Restrictions Precautions Precautions: Fall Required Braces or Orthoses: Knee Immobilizer - Left Knee Immobilizer - Left: Other (comment)(comfort) Restrictions LLE Weight Bearing: Weight bearing as tolerated      Mobility Bed Mobility                  Transfers                      Balance                                           ADL either performed or assessed with clinical judgement   ADL Overall ADL's : Needs assistance/impaired     Grooming: Oral care;Moderate assistance;Sitting                                 General ADL  Comments: Pt has assist for adls at home. Brought toothbrush to under eye; hand over hand assistance needed to guide toothbrush to mouth, then she completed task with therapist helping with cup to mouth and emesis basin to chin as well as full set up.  Based on clinical judgment, pt needs max to total A for most adls.  She cannot stand completely with +2 assist at this time     Vision         Perception     Praxis      Pertinent Vitals/Pain Pain Assessment: No/denies pain     Hand Dominance Right   Extremity/Trunk Assessment Upper Extremity Assessment Upper Extremity Assessment: Generalized weakness           Communication Communication Communication: HOH   Cognition Arousal/Alertness: Awake/alert Behavior During Therapy: WFL for tasks assessed/performed Overall Cognitive Status: Impaired/Different from baseline Area of Impairment: Orientation;Following commands;Safety/judgement;Problem solving                 Orientation Level: Disoriented to;Place;Time;Situation     Following Commands: Follows one step commands with  increased time Safety/Judgement: Decreased awareness of safety;Decreased awareness of deficits   Problem Solving: Slow processing;Decreased initiation;Difficulty sequencing;Requires verbal cues;Requires tactile cues General Comments: pt does initiate, but she moves slowly   General Comments       Exercises     Shoulder Instructions      Home Living                               Home Equipment: Walker - 4 wheels;Shower seat;Hand held shower head;Grab bars - tub/shower;Grab bars - toilet   Additional Comments: from [place] [facility] living, has 24/7 caregiver      Prior Functioning/Environment      ADL's / Homemaking Assistance Needed: 24/7 caregiver assists pt with bathing and dressing, meals delivered to her            OT Problem List: Decreased strength;Decreased activity tolerance;Impaired balance (sitting and/or  standing);Decreased cognition;Decreased safety awareness      OT Treatment/Interventions: Self-care/ADL training;DME and/or AE instruction;Therapeutic activities;Cognitive remediation/compensation;Patient/family education;Balance training    OT Goals(Current goals can be found in the care plan section) Acute Rehab OT Goals Patient Stated Goal: family:  return to [place] with 24/7 OT Goal Formulation: Patient unable to participate in goal setting Time For Goal Achievement: 07/21/19 Potential to Achieve Goals: Fair ADL Goals Additional ADL Goal #1: pt will sit eob x 10 min with min guard for adls Additional ADL Goal #2: pt will go from sit to stand with max +2 in preparation for SPT to 3:1 commode  OT Frequency: Min 2X/week   Barriers to D/C:            Co-evaluation PT/OT/SLP Co-Evaluation/Treatment: Yes Reason for Co-Treatment: For patient/therapist safety PT goals addressed during session: Mobility/safety with mobility OT goals addressed during session: ADL's and self-care      AM-PAC OT "6 Clicks" Daily Activity     Outcome Measure Help from another person eating meals?: A Lot Help from another person taking care of personal grooming?: A Lot Help from another person toileting, which includes using toliet, bedpan, or urinal?: Total Help from another person bathing (including washing, rinsing, drying)?: Total Help from another person to put on and taking off regular upper body clothing?: A Lot Help from another person to put on and taking off regular lower body clothing?: Total 6 Click Score: 9   End of Session    Activity Tolerance: Patient tolerated treatment well Patient left: in bed;with call bell/phone within reach;with bed alarm set  OT Visit Diagnosis: Unsteadiness on feet (R26.81);History of falling (Z91.81);Muscle weakness (generalized) (M62.81)                Time: [id number] OT Time Calculation (min): 24 min Charges:  OT General Charges $OT Visit: 1  Visit OT Evaluation $OT Eval Low Complexity: [place], OTR/L Acute Rehabilitation Services 07/07/2019  [name] 07/07/2019, 1:00 PM

## 2019-07-07 NOTE — TOC Progression Note (Signed)
Transition of Care [facility]) - Progression Note    Patient Details  Name: Gina Bowers MRN: [id number] Date of Birth: [birth year]  Transition of Care [facility]) CM/SW Contact  Shade Flood, LCSW Phone Number: 07/07/2019, 3:35 PM  Clinical Narrative:     TOC following. Pt may be stable for dc over the weekend per MD. Met with pt and her gdtr today to discuss dc planning. Pt resides at [facility] which is independent living. Pt has 24/7 private duty caregivers. Family wishes for pt to return home with continued care from her caregivers. [facility] has therapy services and MD wrote orders for therapy which were provided to gdtr today.  Gdtr will arrange transport at dc. Weekend TOC will follow as needed.   Expected Discharge Plan: Home/Self Care(private duty caregivers 24/7) Barriers to Discharge: Continued Medical Work up  Expected Discharge Plan and Services Expected Discharge Plan: Home/Self Care(private duty caregivers 24/7)       Living arrangements for the past 2 months: [place]                                       Social Determinants of Health (SDOH) Interventions    Readmission Risk Interventions Readmission Risk Prevention Plan 07/06/2019  Transportation Screening Complete  Medication Review (RN CM) Complete  Some recent data might be hidden

## 2019-07-07 NOTE — Progress Notes (Signed)
Physical Therapy Treatment Patient Details Name: Gina Bowers MRN: [id number] DOB: August 13, 1925 Today's Date: 07/07/2019    History of Present Illness 86 yo female admitted to ED for fall on 2/10. Xray L knee reveals L fibular neck fracture with joint effusion, as well as small avulsion fracture at tip of lateral malleolus. CT head and neck negative for acute findings, L hip negative for fracture. PMH includes CAD s/p CABG, PAD, chronic diastolic CHF, hypertension, hyperlipidemia, hypothyroidism, CVA on Plavix, left hip replacement due to fall.    PT Comments    Pt agreeable to mobility this session, PT and OT saw pt together given mobility deficits and limited activity tolerance. Pt required mod-max +2 for bed mobility and transfer to semi-stand position today. Pt unable to come to full standing again this session, limited by fatigue as pt sat EOB for 10 minutes prior to transfer attempt. PT feels pt could benefit from SNF level of care, but per family they wish pt to d/c back to ILF with 24/7 caregiver. Pt may be bed-level upon d/c. Will continue to follow acutely.    Follow Up Recommendations  SNF;Home health PT;Supervision/Assistance - 24 hour     Equipment Recommendations  Other (comment)(defer to next venue)    Recommendations for Other Services       Precautions / Restrictions Precautions Precautions: Fall Required Braces or Orthoses: Knee Immobilizer - Left Knee Immobilizer - Left: Other (comment)(comfort) Restrictions LLE Weight Bearing: Weight bearing as tolerated    Mobility  Bed Mobility Overal bed mobility: Needs Assistance Bed Mobility: Supine to Sit;Sit to Supine;Rolling Rolling: Mod assist;+2 for physical assistance   Supine to sit: HOB elevated;Max assist;+2 for safety/equipment;+2 for physical assistance Sit to supine: Max assist;HOB elevated;+2 for physical assistance;+2 for safety/equipment   General bed mobility comments: Mod +2 for rolling bilaterally for  placing LEs and trunk translation, pt with use of bedrails to assist. Max assist +2 for supine<>sit for LE management, trunk elevation/lowering, and scooting pt to and from EOB.  Transfers Overall transfer level: Needs assistance Equipment used: 2 person hand held assist Transfers: Sit to/from Stand Sit to Stand: Max assist;+2 safety/equipment;From elevated surface;+2 physical assistance         General transfer comment: Max assist +2 for power up, steadying, facilitation of hip extension. Attempted stand x2, unable to come to full standing even with max pt effort.  Ambulation/Gait             General Gait Details: unable to assess   Stairs             Wheelchair Mobility    Modified Rankin (Stroke Patients Only)       Balance Overall balance assessment: Needs assistance;History of Falls Sitting-balance support: No upper extremity supported;Feet supported Sitting balance-Leahy Scale: Poor Sitting balance - Comments: able to sit EOB without PT assist briefly, posterior and R lateral leaning with fatigue requiring assist to correct. Sat EOB x10 minutes Postural control: Posterior lean;Right lateral lean Standing balance support: Bilateral upper extremity supported;During functional activity Standing balance-Leahy Scale: Zero Standing balance comment: max +2 for semi-standing position                            Cognition Arousal/Alertness: Awake/alert Behavior During Therapy: WFL for tasks assessed/performed Overall Cognitive Status: Impaired/Different from baseline Area of Impairment: Orientation;Following commands;Safety/judgement;Problem solving                 Orientation Level:  Disoriented to;Place;Time;Situation     Following Commands: Follows one step commands with increased time Safety/Judgement: Decreased awareness of safety;Decreased awareness of deficits   Problem Solving: Slow processing;Decreased initiation;Difficulty  sequencing;Requires verbal cues;Requires tactile cues General Comments: pt does initiate, but she moves slowly      Exercises      General Comments        Pertinent Vitals/Pain Pain Assessment: No/denies pain    Home Living               Home Equipment: Walker - 4 wheels;Shower seat;Hand held shower head;Grab bars - tub/shower;Grab bars - toilet Additional Comments: from [facility], has 24/7 caregiver    Prior Function      ADL's / Homemaking Assistance Needed: 24/7 caregiver assists pt with bathing and dressing, meals delivered to her     PT Goals (current goals can now be found in the care plan section) Acute Rehab PT Goals Patient Stated Goal: family:  return to [place] with 24/7 PT Goal Formulation: With patient/family Time For Goal Achievement: 07/20/19 Potential to Achieve Goals: Good Progress towards PT goals: Progressing toward goals    Frequency    Min 3X/week      PT Plan Current plan remains appropriate    Co-evaluation PT/OT/SLP Co-Evaluation/Treatment: Yes Reason for Co-Treatment: For patient/therapist safety;To address functional/ADL transfers PT goals addressed during session: Mobility/safety with mobility OT goals addressed during session: ADL's and self-care      AM-PAC PT "6 Clicks" Mobility   Outcome Measure  Help needed turning from your back to your side while in a flat bed without using bedrails?: A Lot Help needed moving from lying on your back to sitting on the side of a flat bed without using bedrails?: Total Help needed moving to and from a bed to a chair (including a wheelchair)?: Total Help needed standing up from a chair using your arms (e.g., wheelchair or bedside chair)?: Total Help needed to walk in hospital room?: Total Help needed climbing 3-5 steps with a railing? : Total 6 Click Score: 7    End of Session Equipment Utilized During Treatment: Gait belt;Left knee immobilizer Activity  Tolerance: Patient limited by fatigue;Patient limited by pain Patient left: in bed;with call bell/phone within reach;with bed alarm set Nurse Communication: Mobility status PT Visit Diagnosis: Other abnormalities of gait and mobility (R26.89);Muscle weakness (generalized) (M62.81);Pain Pain - Right/Left: Left Pain - part of body: Knee     Time: [id number] PT Time Calculation (min) (ACUTE ONLY): 24 min  Charges:  $Therapeutic Activity: 8-22 mins                    Babygirl Trager E, PT Acute Rehabilitation Services Pager [phone]  Office [phone]   Anaira Seay D Anabelle Bungert 07/07/2019, 1:11 PM

## 2019-07-07 NOTE — Progress Notes (Signed)
PROGRESS NOTE    Gina Bowers  [id number] DOB: 10-13-1925 DOA: 07/05/2019 PCP: Lavone Orn, MD    Brief Narrative:  86 y.o. female with medical history significant for CADs/pCABG, PAD, chronic diastolic CHF, hypertension, hyperlipidemia, hypothyroidism, CVA on Plavix,left hip replacementwho presents s/p fall.  Patient alert and oriented only to self and location but otherwise unable to provide history.  Entirety of history obtained from ER physician and granddaughter at bedside. Granddaughter states that she has noticed a progressive decline in her mental state for the past 3 weeks and has noticed an acute change since 4 PM today. She was recently treated for a UTI  With Levaquin and is kept on chronic antibiotic for recurrent UTIs.  Since then has had progressive weakness and sustained a fall on Sunday. Normally walks with cane. Since her first fall she has not wanted to ambulate and again had a fall today prompting ED evaluation.  Patient at baseline per granddaughter is alert and oriented x4 and frequently does word puzzles,plays jeopardy and is very conversive.   Granddaughter denies her having any fever or chills but has noticed an increased tremor of both of her hands since the falls.  Denies having any complaints of chest pain, shortness of breath.  No cough or runny nose. No focal neurological deficits.  Patient has had a good appetite and denies any nausea, vomiting diarrhea or constipation.  In the ED, she was afebrile and hypertensive up to systolic of [id number] and required 2.5L via [place] with oxygen desaturation down to 88% on room air. Lab work significant for elevated WBC of 13.4, hemoglobin of 11.8.  K of 6.3, glucose of 114. Creatinine normal at 0.70.    CT head negative. CT cervical negative for fracture.  CTA chest negative for PE. Showed chronic ILD and cardiomegaly.  X-ray of left hip negative X-ray of left knee shows left fibular neck fracture with moderate to  large joint effusion Left ankle X-ray with small avulsion fracture off the tip of the lateral malleolus  ED physician consultedon-call orthopedics Dr. Lucia Gaskins who reviewed patient's films. He recommends patient can be weight bearing as tolerated. If needed she can wear knee immobilizer but does not necessarily have to. If admitted to the hospital he would recommend PT/OT eval.  She was given albuterol neb, insulin 5 units and D50 for her hyperkalemia.  Assessment & Plan:   Principal Problem:   Fall Active Problems:   Hypothyroidism   Anxiety state   Coronary artery disease involving coronary bypass graft of native heart   Dilatation of thoracic aorta (HCC)   Altered mental status   Hyperkalemia   Avulsion fracture of lateral malleolus   Left fibular fracture   Chronic UTI   Hx of CABG   History of CVA (cerebrovascular accident)   Altered mental status, toxic metabolic encephalopathy CT head reviewed, unremarkable Checked VBG reviewed, unremarkable UA suggestive of UTI. See below Remains confused and encepalopathic this AM. Able to say she is in hospital but unable to tell date or year Repeat cbc in AM  Acute hypoxic respiratory failure possibly acute on chronic worsening of ILD seen on CTA chest no PE, no findings on imaging or exam to suggest acute CHF Continued on duoneb q6hr as tolerated Presently on minimal O2 support  small avulsion fracture off the tip of the left lateral malleolus  Left fibular neck fracture with moderate to large joint effusion s/p mechanical fall from weakness Ortho consulted by ED physician  Ortho  recommendation for weight bearing as tolerated Knee and ankle immobilizer in place PT/OT consulted, recommendation for SNF. Discussed with family who would prefer eventual d/c home. Family states they can provide 24hr supervision  Hyperkalemia Presenting K of 6.2 from prior normal. No EKG changes. received  insulin 5 units and D50 in the  ED K of 3.3 today, will replace Repeat bmet in AM  Leukocytosis Improving Likely related to enterococcus UTI below Also possibly reactive secondary to above avulsion fracture Recheck cbc in AM  Acute on chronic UTI Was continued on empiric IV ampicillin given hx or recurrent enterococcus UTI Urine cx growing enterococcus resistant to ampicillin Discussed with Pharmacy, plan to give trial of fosfomycin x1 Still encephalopathic. If no improvement with fosfomycin, may need ID involvement  Chronic anemia Remains stable in the 11 range Hemodynamically stable at this time Repeat CBC in AM  Ascending thoracic aortic dilatation Incidential finding of CTA chest 4cm Recommendation for annual follow up by CTA or MRA   Hx of CVA  CT head negative and no focal neurological deficit continue aspirin and plavix as pt tolerates  Hx of CAD s/p CABG  continue Imdur, losartan, metoprolol No chest pain this AM, remains stable  Anxiety continue Zoloft as tolerated  Hypothyroidism Continue levothyroxine TSH of 0.999  DVT prophylaxis: Lovenox subq Code Status: DNR Family Communication: Pt in room, family not at bedside Disposition Plan: Dc home when no longer encephalopathic  Consultants:     Procedures:     Antimicrobials: Anti-infectives (From admission, onward)   Start     Dose/Rate Route Frequency Ordered Stop   07/07/19 1400  fosfomycin (MONUROL) packet 3 g     3 g Oral  Once 07/07/19 1156     07/06/19 1830  ampicillin (OMNIPEN) 1 g in sodium chloride 0.9 % 100 mL IVPB  Status:  Discontinued     1 g 300 mL/hr over 20 Minutes Intravenous Every 6 hours 07/06/19 1807 07/07/19 1156   07/06/19 1800  cephALEXin (KEFLEX) capsule 250 mg  Status:  Discontinued     250 mg Oral Every evening 07/06/19 0112 07/06/19 1807      Subjective: Remains somewhat confused today but better  Objective: Vitals:   07/07/19 0625 07/07/19 0751 07/07/19 0815 07/07/19 1229  BP: (!)  179/83  140/65 (!) 108/55  Pulse: 72  77 69  Resp: 19     Temp: 97.7 F (36.5 C)   (!) 97.5 F (36.4 C)  TempSrc: Oral   Oral  SpO2: 96% 96%  97%  Weight:      Height:        Intake/Output Summary (Last 24 hours) at 07/07/2019 1341 Last data filed at 07/07/2019 [id number] Gross per 24 hour  Intake 200 ml  Output 450 ml  Net -250 ml   Filed Weights   07/06/19 0406  Weight: 74.6 kg    Examination: General exam: Conversant, in no acute distress Respiratory system: normal chest rise, clear, no audible wheezing Cardiovascular system: regular rhythm, s1-s2 Gastrointestinal system: Nondistended, nontender, pos BS Central nervous system: No seizures, no tremors Extremities: No cyanosis, no joint deformities Skin: No rashes, no pallor Psychiatry: Affect normal // no auditory hallucinations, seems somewhat confused  Data Reviewed: I have personally reviewed following labs and imaging studies  CBC: Recent Labs  Lab 07/05/19 2115 07/06/19 0356 07/07/19 0418  WBC 13.4* 11.2* 11.5*  NEUTROABS 10.5*  --   --   HGB 11.8* 11.0* 10.3*  HCT 36.3 34.5* 32.2*  MCV 98.6 99.4 97.3  PLT 270 236 [id number]   Basic Metabolic Panel: Recent Labs  Lab 07/05/19 2115 07/06/19 0119 07/06/19 0356 07/07/19 0418  NA 136  --  136 133*  K 6.2* 3.2* 3.4* 3.3*  CL 98  --  100 95*  CO2 27  --  27 30  GLUCOSE 114*  --  147* 137*  BUN 14  --  11 15  CREATININE 0.70  --  0.52 0.42*  CALCIUM 9.0  --  8.8* 8.4*   GFR: Estimated Creatinine Clearance: 42.3 mL/min (A) (by C-G formula based on SCr of 0.42 mg/dL (L)). Liver Function Tests: Recent Labs  Lab 07/07/19 0418  AST 19  ALT 16  ALKPHOS 45  BILITOT 0.9  PROT 6.1*  ALBUMIN 2.9*   No results for input(s): LIPASE, AMYLASE in the last 168 hours. No results for input(s): AMMONIA in the last 168 hours. Coagulation Profile: No results for input(s): INR, PROTIME in the last 168 hours. Cardiac Enzymes: Recent Labs  Lab 07/06/19 0119  CKTOTAL 20*     BNP (last 3 results) No results for input(s): PROBNP in the last 8760 hours. HbA1C: No results for input(s): HGBA1C in the last 72 hours. CBG: No results for input(s): GLUCAP in the last 168 hours. Lipid Profile: No results for input(s): CHOL, HDL, LDLCALC, TRIG, CHOLHDL, LDLDIRECT in the last 72 hours. Thyroid Function Tests: Recent Labs    07/06/19 0356  TSH 0.999   Anemia Panel: No results for input(s): VITAMINB12, FOLATE, FERRITIN, TIBC, IRON, RETICCTPCT in the last 72 hours. Sepsis Labs: No results for input(s): PROCALCITON, LATICACIDVEN in the last 168 hours.  Recent Results (from the past 240 hour(s))  Respiratory Panel by RT PCR (Flu A&B, Covid) - Nasopharyngeal Swab     Status: None   Collection Time: 07/05/19  7:54 PM   Specimen: Nasopharyngeal Swab  Result Value Ref Range Status   SARS Coronavirus 2 by RT PCR NEGATIVE NEGATIVE Final    Comment: (NOTE) SARS-CoV-2 target nucleic acids are NOT DETECTED. The SARS-CoV-2 RNA is generally detectable in upper respiratoy specimens during the acute phase of infection. The lowest concentration of SARS-CoV-2 viral copies this assay can detect is 131 copies/mL. A negative result does not preclude SARS-Cov-2 infection and should not be used as the sole basis for treatment or other patient management decisions. A negative result may occur with  improper specimen collection/handling, submission of specimen other than nasopharyngeal swab, presence of viral mutation(s) within the areas targeted by this assay, and inadequate number of viral copies (<131 copies/mL). A negative result must be combined with clinical observations, patient history, and epidemiological information. The expected result is Negative. Fact Sheet for Patients:  PinkCheek.be Fact Sheet for Healthcare Providers:  GravelBags.it This test is not yet ap proved or cleared by the [place] FDA and   has been authorized for detection and/or diagnosis of SARS-CoV-2 by FDA under an Emergency Use Authorization (EUA). This EUA will remain  in effect (meaning this test can be used) for the duration of the COVID-19 declaration under Section 564(b)(1) of the Act, 21 U.S.C. section 360bbb-3(b)(1), unless the authorization is terminated or revoked sooner.    Influenza A by PCR NEGATIVE NEGATIVE Final   Influenza B by PCR NEGATIVE NEGATIVE Final    Comment: (NOTE) The Xpert Xpress SARS-CoV-2/FLU/RSV assay is intended as an aid in  the diagnosis of influenza from Nasopharyngeal swab specimens and  should not be used as a sole basis for  treatment. Nasal washings and  aspirates are unacceptable for Xpert Xpress SARS-CoV-2/FLU/RSV  testing. Fact Sheet for Patients: PinkCheek.be Fact Sheet for Healthcare Providers: GravelBags.it This test is not yet approved or cleared by the [place] FDA and  has been authorized for detection and/or diagnosis of SARS-CoV-2 by  FDA under an Emergency Use Authorization (EUA). This EUA will remain  in effect (meaning this test can be used) for the duration of the  Covid-19 declaration under Section 564(b)(1) of the Act, 21  U.S.C. section 360bbb-3(b)(1), unless the authorization is  terminated or revoked. Performed at [facility], [place] [address], [place], [place] [address]   Culture, Urine     Status: Abnormal   Collection Time: 07/06/19  1:19 AM   Specimen: Urine, Clean Catch  Result Value Ref Range Status   Specimen Description   Final    URINE, CLEAN CATCH Performed at [facility], [place] [address], [place], [place] [address]    Special Requests   Final    NONE Performed at [facility], [place] [address], [place], [place] [address]    Culture 70,000 COLONIES/mL ENTEROCOCCUS FAECALIS (A)  Final   Report Status 07/07/2019 FINAL  Final    Organism ID, Bacteria ENTEROCOCCUS FAECALIS (A)  Final      Susceptibility   Enterococcus faecalis - MIC*    AMPICILLIN RESISTANT Resistant     NITROFURANTOIN <=16 SENSITIVE Sensitive     VANCOMYCIN 1 SENSITIVE Sensitive     * 70,000 COLONIES/mL ENTEROCOCCUS FAECALIS  MRSA PCR Screening     Status: None   Collection Time: 07/06/19  2:53 AM   Specimen: Nasal Mucosa; Nasopharyngeal  Result Value Ref Range Status   MRSA by PCR NEGATIVE NEGATIVE Final    Comment:        The GeneXpert MRSA Assay (FDA approved for NASAL specimens only), is one component of a comprehensive MRSA colonization surveillance program. It is not intended to diagnose MRSA infection nor to guide or monitor treatment for MRSA infections. Performed at [facility], [place] [address], [place], [place] [address]      Radiology Studies: DG Ankle Complete Left  Result Date: 07/05/2019 CLINICAL DATA:  Left ankle pain, fell, lateral swelling EXAM: LEFT ANKLE COMPLETE - 3+ VIEW COMPARISON:  None. FINDINGS: Frontal, oblique, and lateral views of the left ankle are obtained. There is a small avulsion fracture at the tip the lateral malleolus. Mild overlying soft tissue edema. There is diffuse osteoarthritis of the ankle, hindfoot, and midfoot. Prominent calcaneal spurs are noted. IMPRESSION: 1. Small avulsion fracture off the tip of the lateral malleolus. 2. Diffuse osteoarthritis. Electronically Signed   By: Randa Ngo M.D.   On: 07/05/2019 23:01   CT Head Wo Contrast  Result Date: 07/05/2019 CLINICAL DATA:  Fall.  On Plavix. EXAM: CT HEAD WITHOUT CONTRAST TECHNIQUE: Contiguous axial images were obtained from the base of the skull through the vertex without intravenous contrast. COMPARISON:  12/06/2018 FINDINGS: Brain: There is atrophy and chronic small vessel disease changes. No acute intracranial abnormality. Specifically, no hemorrhage, hydrocephalus, mass lesion, acute infarction, or significant  intracranial injury. Vascular: No hyperdense vessel or unexpected calcification. Skull: No acute calvarial abnormality. Sinuses/Orbits: No acute findings Other: None IMPRESSION: Atrophy, chronic microvascular disease. No acute intracranial abnormality. Electronically Signed   By: Rolm Baptise M.D.   On: 07/05/2019 20:35   CT Angio Chest PE W and/or Wo Contrast  Addendum Date: 07/05/2019   ADDENDUM  REPORT: 07/05/2019 23:34 ADDENDUM: Ascending thoracic aortic dilatation up to 4 cm. Recommend annual imaging followup by CTA or MRA. This recommendation follows 2010 ACCF/AHA/AATS/ACR/ASA/SCA/SCAI/SIR/STS/SVM Guidelines for the Diagnosis and Management of Patients with Thoracic Aortic Disease. Circulation. 2010; [id number]. Aortic aneurysm NOS (ICD10-I71.9) This addendum was called by telephone at the time of interpretation on 07/05/2019 at 11:34 pm to provider [facility] , who verbally acknowledged these results. Electronically Signed   By: Lovena Le M.D.   On: 07/05/2019 23:34   Result Date: 07/05/2019 CLINICAL DATA:  Concern for PE, hypoxia and shortness of breath EXAM: CT ANGIOGRAPHY CHEST WITH CONTRAST TECHNIQUE: Multidetector CT imaging of the chest was performed using the standard protocol during bolus administration of intravenous contrast. Multiplanar CT image reconstructions and MIPs were obtained to evaluate the vascular anatomy. CONTRAST:  153mL OMNIPAQUE IOHEXOL 350 MG/ML SOLN COMPARISON:  CT 06/12/2016 FINDINGS: Cardiovascular: Satisfactory opacification of the pulmonary arteries without large central or lobar filling defects. More distal evaluation is limited by respiratory motion and cardiac pulsation artifact. Mild cardiomegaly. Extensive coronary artery calcifications and postsurgical changes related to prior CABG. No pericardial effusion. Borderline dilation of the ascending thoracic aorta measuring up to 4 cm in diameter. Atherosclerotic calcification present within the thoracic aorta  and normally branching proximal great vessels. Mediastinum/Nodes: Postsurgical changes in the anterior mediastinum likely related prior CABG. There is a moderate hiatal hernia. The thoracic esophagus is fluid-filled and patulous. Tortuosity of the trachea is similar to comparison studies. Posterior bowing likely related to imaging during exhalation. Mild diffuse airways thickening is noted. Thyroid gland is diminutive which may reflect prior surgery or radioactive iodine therapy. Lungs/Pleura: Evaluation of the lung parenchyma is limited due to extensive respiratory motion artifact. Subpleural predominant reticular changes are better assessed on comparison exam with diffuse airways thickening and bronchiectatic changes. Mild bronchiectatic changes and mucous plugging. No focal consolidative opacity is seen. No pneumothorax or effusion. Upper Abdomen: No acute abnormalities present in the visualized portions of the upper abdomen. Of enlarging fluid attenuation cyst in the upper pole left kidney now measuring up to 6 cm in diameter (previously 4.6 cm. No worrisome renal lesion is seen however. Reflux of contrast into the hepatic veins may reflect elevated right heart pressures. Musculoskeletal: Exaggerated thoracic kyphosis. Multilevel discogenic and facet degenerative changes are present. Prior vertebroplasty changes at T10 are similar to prior with small amount of cement within the T9-T10 disc space. Remote right third anterior rib deformity. Increased AP diameter of the chest. Additional degenerative changes noted in the shoulders. No suspicious osseous lesions. Review of the MIP images confirms the above findings. IMPRESSION: 1. No evidence of acute central or lobar pulmonary embolism. More distal evaluation is limited by respiratory motion and cardiac pulsation artifact. 2. Chronic interstitial lung disease with associated airways thickening and bronchiectatic changes. No focal consolidative opacity to suggest  pneumonia. 3. Moderate hiatal hernia with fluid-filled and patulous thoracic esophagus, correlate with features of reflux. 4. Cardiomegaly. Reflux of contrast into the hepatic veins may reflect elevated right heart pressures. 5. Aortic Atherosclerosis (ICD10-I70.0). Electronically Signed: By: Lovena Le M.D. On: 07/05/2019 23:08   CT Cervical Spine Wo Contrast  Result Date: 07/05/2019 CLINICAL DATA:  Fall EXAM: CT CERVICAL SPINE WITHOUT CONTRAST TECHNIQUE: Multidetector CT imaging of the cervical spine was performed without intravenous contrast. Multiplanar CT image reconstructions were also generated. COMPARISON:  06/08/2017 FINDINGS: Alignment: Normal Skull base and vertebrae: No acute fracture. No primary bone lesion or focal pathologic process. Soft tissues and  spinal canal: No prevertebral fluid or swelling. No visible canal hematoma. Disc levels: Moderate to advanced diffuse degenerative facet disease, left greater than right. Disc spaces maintained. Mild anterior spurring Upper chest: No acute findings Other: None IMPRESSION: Degenerative disc and advanced degenerative facet disease. No acute bony abnormality. Electronically Signed   By: Rolm Baptise M.D.   On: 07/05/2019 20:37   DG Chest Portable 1 View  Result Date: 07/05/2019 CLINICAL DATA:  Fall.  Left hip and knee pain EXAM: PORTABLE CHEST 1 VIEW COMPARISON:  12/06/2018 FINDINGS: Prior CABG. Low lung volumes. No confluent opacities, effusions or edema. Heart is normal size. No acute bony abnormality. IMPRESSION: No active disease. Electronically Signed   By: Rolm Baptise M.D.   On: 07/05/2019 20:29   DG Knee Complete 4 Views Left  Result Date: 07/05/2019 CLINICAL DATA:  Fall.  Left knee pain EXAM: LEFT KNEE - COMPLETE 4+ VIEW COMPARISON:  None. FINDINGS: There is a nondisplaced fracture through the proximal left fibular metaphysis. No additional fracture seen. For moderate to large joint effusion. Joint spaces maintained. IMPRESSION: Left  fibular neck fracture. Moderate to large joint effusion. Electronically Signed   By: Rolm Baptise M.D.   On: 07/05/2019 20:28   DG Hip Unilat W or Wo Pelvis 2-3 Views Left  Result Date: 07/05/2019 CLINICAL DATA:  Fall, left hip pain EXAM: DG HIP (WITH OR WITHOUT PELVIS) 2-3V LEFT COMPARISON:  01/31/2019 FINDINGS: Patient is status post left hip replacement. Old healed left inferior pubic ramus fracture. No acute fracture, subluxation or dislocation. No hardware complicating feature. IMPRESSION: Left hip replacement. No acute bony abnormality. Electronically Signed   By: Rolm Baptise M.D.   On: 07/05/2019 20:28    Scheduled Meds: . aspirin EC  81 mg Oral QPM  . clopidogrel  75 mg Oral Daily  . docusate sodium  100 mg Oral BID  . enoxaparin (LOVENOX) injection  40 mg Subcutaneous Q24H  . feeding supplement (ENSURE ENLIVE)  237 mL Oral BID BM  . fosfomycin  3 g Oral Once  . ipratropium-albuterol  3 mL Nebulization BID  . isosorbide mononitrate  30 mg Oral Daily  . levothyroxine  88 mcg Oral Q0600  . losartan  50 mg Oral BID  . metoprolol succinate  25 mg Oral Daily  . pantoprazole  40 mg Oral Daily  . rosuvastatin  20 mg Oral Daily  . sertraline  50 mg Oral QPM   Continuous Infusions:   LOS: 1 day   Marylu Lund, MD [facility] Pager On Amion  If 7PM-7AM, please contact night-coverage 07/07/2019, 1:41 PM

## 2019-07-08 LAB — COMPREHENSIVE METABOLIC PANEL
ALT: 14 U/L (ref 0–44)
AST: 18 U/L (ref 15–41)
Albumin: 2.7 g/dL — ABNORMAL LOW (ref 3.5–5.0)
Alkaline Phosphatase: 37 U/L — ABNORMAL LOW (ref 38–126)
Anion gap: 9 (ref 5–15)
BUN: 10 mg/dL (ref 8–23)
CO2: 29 mmol/L (ref 22–32)
Calcium: 8.4 mg/dL — ABNORMAL LOW (ref 8.9–10.3)
Chloride: 98 mmol/L (ref 98–111)
Creatinine, Ser: 0.38 mg/dL — ABNORMAL LOW (ref 0.44–1.00)
GFR calc Af Amer: >60 mL/min (ref >60)
GFR calc non Af Amer: >60 mL/min (ref >60)
Glucose, Bld: 101 mg/dL — ABNORMAL HIGH (ref 70–99)
Potassium: 3.3 mmol/L — ABNORMAL LOW (ref 3.5–5.1)
Sodium: 136 mmol/L (ref 135–145)
Total Bilirubin: 0.4 mg/dL (ref 0.3–1.2)
Total Protein: 5.4 g/dL — ABNORMAL LOW (ref 6.5–8.1)

## 2019-07-08 LAB — CBC
HCT: 29.8 % — ABNORMAL LOW (ref 36.0–46.0)
Hemoglobin: 9.5 g/dL — ABNORMAL LOW (ref 12.0–15.0)
MCH: 31.7 pg (ref 26.0–34.0)
MCHC: 31.9 g/dL (ref 30.0–36.0)
MCV: 99.3 fL (ref 80.0–100.0)
Platelets: 202 10*3/uL (ref 150–400)
RBC: 3 MIL/uL — ABNORMAL LOW (ref 3.87–5.11)
RDW: 12.5 % (ref 11.5–15.5)
WBC: 9.2 10*3/uL (ref 4.0–10.5)
nRBC: 0 % (ref 0.0–0.2)

## 2019-07-08 MED ORDER — GERHARDT'S BUTT CREAM
TOPICAL_CREAM | Freq: Two times a day (BID) | CUTANEOUS | Status: DC
  Filled 2019-07-08 (×2): qty 1

## 2019-07-08 MED ORDER — POTASSIUM CHLORIDE CRYS ER 20 MEQ PO TBCR
40.0000 meq | EXTENDED_RELEASE_TABLET | Freq: Two times a day (BID) | ORAL | Status: AC
  Administered 2019-07-08 (×2): 40 meq via ORAL
  Filled 2019-07-08 (×2): qty 2

## 2019-07-08 MED ORDER — FUROSEMIDE 10 MG/ML IJ SOLN
40.0000 mg | Freq: Once | INTRAMUSCULAR | Status: AC
  Administered 2019-07-08: 40 mg via INTRAVENOUS
  Filled 2019-07-08: qty 4

## 2019-07-08 MED ORDER — NYSTATIN 100000 UNIT/GM EX POWD
Freq: Two times a day (BID) | CUTANEOUS | Status: DC
  Filled 2019-07-08: qty 15

## 2019-07-08 MED ORDER — PREDNISONE 20 MG PO TABS
40.0000 mg | ORAL_TABLET | Freq: Once | ORAL | Status: AC
  Administered 2019-07-08: 40 mg via ORAL
  Filled 2019-07-08: qty 2

## 2019-07-08 MED ORDER — IPRATROPIUM-ALBUTEROL 0.5-2.5 (3) MG/3ML IN SOLN: 3.0000 mL | Freq: Four times a day (QID) | RESPIRATORY_TRACT | Status: DC | PRN

## 2019-07-08 NOTE — Plan of Care (Signed)
  Problem: Clinical Measurements: Goal: Cardiovascular complication will be avoided Outcome: Progressing   Problem: Activity: Goal: Risk for activity intolerance will decrease Outcome: Progressing   Problem: Nutrition: Goal: Adequate nutrition will be maintained Outcome: Progressing   Problem: Coping: Goal: Level of anxiety will decrease Outcome: Progressing   Problem: Elimination: Goal: Will not experience complications related to bowel motility Outcome: Progressing   

## 2019-07-08 NOTE — Progress Notes (Signed)
Rn attempting to notify md of family concerns for d/c. They are concerned that she needs a different daily antibiotic, treatment at home for yeast that may arise in peri area, and also the family will need time to arrange transport.

## 2019-07-08 NOTE — Progress Notes (Signed)
PROGRESS NOTE    Gina Bowers  [id number] DOB: 1925-12-21 DOA: 07/05/2019 PCP: Lavone Orn, MD    Brief Narrative:  86 y.o. female with medical history significant for CADs/pCABG, PAD, chronic diastolic CHF, hypertension, hyperlipidemia, hypothyroidism, CVA on Plavix,left hip replacementwho presents s/p fall.  Patient alert and oriented only to self and location but otherwise unable to provide history.  Entirety of history obtained from ER physician and granddaughter at bedside. Granddaughter states that she has noticed a progressive decline in her mental state for the past 3 weeks and has noticed an acute change since 4 PM today. She was recently treated for a UTI  With Levaquin and is kept on chronic antibiotic for recurrent UTIs.  Since then has had progressive weakness and sustained a fall on Sunday. Normally walks with cane. Since her first fall she has not wanted to ambulate and again had a fall today prompting ED evaluation.  Patient at baseline per granddaughter is alert and oriented x4 and frequently does word puzzles,plays jeopardy and is very conversive.   Granddaughter denies her having any fever or chills but has noticed an increased tremor of both of her hands since the falls.  Denies having any complaints of chest pain, shortness of breath.  No cough or runny nose. No focal neurological deficits.  Patient has had a good appetite and denies any nausea, vomiting diarrhea or constipation.  In the ED, she was afebrile and hypertensive up to systolic of [id number] and required 2.5L via [place] with oxygen desaturation down to 88% on room air. Lab work significant for elevated WBC of 13.4, hemoglobin of 11.8.  K of 6.3, glucose of 114. Creatinine normal at 0.70.    CT head negative. CT cervical negative for fracture.  CTA chest negative for PE. Showed chronic ILD and cardiomegaly.  X-ray of left hip negative X-ray of left knee shows left fibular neck fracture with moderate to  large joint effusion Left ankle X-ray with small avulsion fracture off the tip of the lateral malleolus  ED physician consultedon-call orthopedics Dr. Lucia Gaskins who reviewed patient's films. He recommends patient can be weight bearing as tolerated. If needed she can wear knee immobilizer but does not necessarily have to. If admitted to the hospital he would recommend PT/OT eval.  She was given albuterol neb, insulin 5 units and D50 for her hyperkalemia.  Assessment & Plan:   Principal Problem:   Fall Active Problems:   Hypothyroidism   Anxiety state   Coronary artery disease involving coronary bypass graft of native heart   Dilatation of thoracic aorta (HCC)   Altered mental status   Hyperkalemia   Avulsion fracture of lateral malleolus   Left fibular fracture   Chronic UTI   Hx of CABG   History of CVA (cerebrovascular accident)  Altered mental status, toxic metabolic encephalopathy CT head reviewed, unremarkable Checked VBG reviewed, unremarkable UA suggestive of UTI. See below Remains afebrile On discussion with grand-daughter at bedside 2/12, family reported patient seems much improved  Acute hypoxic respiratory failure possibly acute on chronic worsening of ILD seen on CTA chest no PE, no findings on imaging or exam to suggest acute CHF Continued on duoneb q6hr as tolerated Presently on minimal O2 support Remains O2 dependent on 1-2LNC. Will give trial of IV lasix x2, wean as tolerated  small avulsion fracture off the tip of the left lateral malleolus  Left fibular neck fracture with moderate to large joint effusion s/p mechanical fall from weakness Ortho consulted  by ED physician  Ortho recommendation for weight bearing as tolerated Knee and ankle immobilizer in place PT/OT consulted, recommendation for SNF. Discussed with family who would prefer eventual d/c home. Family states they can provide 24hr supervision Seen by [facility]  Hyperkalemia Presenting K of 6.2  from prior normal. No EKG changes. received  insulin 5 units and D50 in the ED Potassium remains 3.3 today. Cont to replace Repeat bmet in AM  Leukocytosis Improving Likely related to enterococcus UTI below Also possibly reactive secondary to above avulsion fracture Normalized  Acute on chronic UTI Was continued on empiric IV ampicillin given hx or recurrent enterococcus UTI Urine cx growing enterococcus resistant to ampicillin Discussed with Pharmacy, plan to give trial of fosfomycin x1 Remains stable  Chronic anemia Remains stable in the 11 range Hemodynamically stable at this time Recheck cbc in AM  Ascending thoracic aortic dilatation Incidential finding of CTA chest 4cm Recommendation for annual follow up by CTA or MRA   Hx of CVA  CT head negative and no focal neurological deficit continue aspirin and plavix as pt tolerates  Hx of CAD s/p CABG  continue Imdur, losartan, metoprolol No chest pain this AM, remains stable  Anxiety continue Zoloft as tolerated  Hypothyroidism Continue levothyroxine TSH of 0.999  DVT prophylaxis: Lovenox subq Code Status: DNR Family Communication: Pt in room, family not at bedside Disposition Plan: From independent living, dc home when no longer O2 dependent, still requiring 1-2LNC  Consultants:     Procedures:     Antimicrobials: Anti-infectives (From admission, onward)   Start     Dose/Rate Route Frequency Ordered Stop   07/07/19 1400  fosfomycin (MONUROL) packet 3 g     3 g Oral  Once 07/07/19 1156 07/07/19 1509   07/06/19 1830  ampicillin (OMNIPEN) 1 g in sodium chloride 0.9 % 100 mL IVPB  Status:  Discontinued     1 g 300 mL/hr over 20 Minutes Intravenous Every 6 hours 07/06/19 1807 07/07/19 1156   07/06/19 1800  cephALEXin (KEFLEX) capsule 250 mg  Status:  Discontinued     250 mg Oral Every evening 07/06/19 0112 07/06/19 1807      Subjective: Without complaints this AM, still on  O2  Objective: Vitals:   07/08/19 1240 07/08/19 1323 07/08/19 1632 07/08/19 1633  BP:  130/64    Pulse:  70    Resp:  17 20 19   Temp:  98.8 F (37.1 C)    TempSrc:  Oral    SpO2: 95% 98% 92% 94%  Weight:      Height:        Intake/Output Summary (Last 24 hours) at 07/08/2019 1655 Last data filed at 07/08/2019 1608 Gross per 24 hour  Intake 1400 ml  Output 1000 ml  Net 400 ml   Filed Weights   07/06/19 0406  Weight: 74.6 kg    Examination: General exam: Awake, laying in bed, in nad Respiratory system: somewhat increased respiratory effort, no audible wheezing Cardiovascular system: regular rate, s1, s2 Gastrointestinal system: Soft, nondistended, positive BS Central nervous system: CN2-12 grossly intact, strength intact Extremities: Perfused, no clubbing Skin: Normal skin turgor, no notable skin lesions seen Psychiatry: Mood normal // no visual hallucinations   Data Reviewed: I have personally reviewed following labs and imaging studies  CBC: Recent Labs  Lab 07/05/19 2115 07/06/19 0356 07/07/19 0418 07/08/19 0418  WBC 13.4* 11.2* 11.5* 9.2  NEUTROABS 10.5*  --   --   --   HGB  11.8* 11.0* 10.3* 9.5*  HCT 36.3 34.5* 32.2* 29.8*  MCV 98.6 99.4 97.3 99.3  PLT 270 236 228 [id number]   Basic Metabolic Panel: Recent Labs  Lab 07/05/19 2115 07/06/19 0119 07/06/19 0356 07/07/19 0418 07/08/19 0418  NA 136  --  136 133* 136  K 6.2* 3.2* 3.4* 3.3* 3.3*  CL 98  --  100 95* 98  CO2 27  --  27 30 29   GLUCOSE 114*  --  147* 137* 101*  BUN 14  --  11 15 10   CREATININE 0.70  --  0.52 0.42* 0.38*  CALCIUM 9.0  --  8.8* 8.4* 8.4*   GFR: Estimated Creatinine Clearance: 42.3 mL/min (A) (by C-G formula based on SCr of 0.38 mg/dL (L)). Liver Function Tests: Recent Labs  Lab 07/07/19 0418 07/08/19 0418  AST 19 18  ALT 16 14  ALKPHOS 45 37*  BILITOT 0.9 0.4  PROT 6.1* 5.4*  ALBUMIN 2.9* 2.7*   No results for input(s): LIPASE, AMYLASE in the last 168 hours. No  results for input(s): AMMONIA in the last 168 hours. Coagulation Profile: No results for input(s): INR, PROTIME in the last 168 hours. Cardiac Enzymes: Recent Labs  Lab 07/06/19 0119  CKTOTAL 20*   BNP (last 3 results) No results for input(s): PROBNP in the last 8760 hours. HbA1C: No results for input(s): HGBA1C in the last 72 hours. CBG: No results for input(s): GLUCAP in the last 168 hours. Lipid Profile: No results for input(s): CHOL, HDL, LDLCALC, TRIG, CHOLHDL, LDLDIRECT in the last 72 hours. Thyroid Function Tests: Recent Labs    07/06/19 0356  TSH 0.999   Anemia Panel: No results for input(s): VITAMINB12, FOLATE, FERRITIN, TIBC, IRON, RETICCTPCT in the last 72 hours. Sepsis Labs: No results for input(s): PROCALCITON, LATICACIDVEN in the last 168 hours.  Recent Results (from the past 240 hour(s))  Respiratory Panel by RT PCR (Flu A&B, Covid) - Nasopharyngeal Swab     Status: None   Collection Time: 07/05/19  7:54 PM   Specimen: Nasopharyngeal Swab  Result Value Ref Range Status   SARS Coronavirus 2 by RT PCR NEGATIVE NEGATIVE Final    Comment: (NOTE) SARS-CoV-2 target nucleic acids are NOT DETECTED. The SARS-CoV-2 RNA is generally detectable in upper respiratoy specimens during the acute phase of infection. The lowest concentration of SARS-CoV-2 viral copies this assay can detect is 131 copies/mL. A negative result does not preclude SARS-Cov-2 infection and should not be used as the sole basis for treatment or other patient management decisions. A negative result may occur with  improper specimen collection/handling, submission of specimen other than nasopharyngeal swab, presence of viral mutation(s) within the areas targeted by this assay, and inadequate number of viral copies (<131 copies/mL). A negative result must be combined with clinical observations, patient history, and epidemiological information. The expected result is Negative. Fact Sheet for Patients:   PinkCheek.be Fact Sheet for Healthcare Providers:  GravelBags.it This test is not yet ap proved or cleared by the [place] FDA and  has been authorized for detection and/or diagnosis of SARS-CoV-2 by FDA under an Emergency Use Authorization (EUA). This EUA will remain  in effect (meaning this test can be used) for the duration of the COVID-19 declaration under Section 564(b)(1) of the Act, 21 U.S.C. section 360bbb-3(b)(1), unless the authorization is terminated or revoked sooner.    Influenza A by PCR NEGATIVE NEGATIVE Final   Influenza B by PCR NEGATIVE NEGATIVE Final    Comment: (NOTE) The  Xpert Xpress SARS-CoV-2/FLU/RSV assay is intended as an aid in  the diagnosis of influenza from Nasopharyngeal swab specimens and  should not be used as a sole basis for treatment. Nasal washings and  aspirates are unacceptable for Xpert Xpress SARS-CoV-2/FLU/RSV  testing. Fact Sheet for Patients: PinkCheek.be Fact Sheet for Healthcare Providers: GravelBags.it This test is not yet approved or cleared by the [place] FDA and  has been authorized for detection and/or diagnosis of SARS-CoV-2 by  FDA under an Emergency Use Authorization (EUA). This EUA will remain  in effect (meaning this test can be used) for the duration of the  Covid-19 declaration under Section 564(b)(1) of the Act, 21  U.S.C. section 360bbb-3(b)(1), unless the authorization is  terminated or revoked. Performed at [facility], [place] [address], [place], [place] [address]   Culture, Urine     Status: Abnormal   Collection Time: 07/06/19  1:19 AM   Specimen: Urine, Clean Catch  Result Value Ref Range Status   Specimen Description   Final    URINE, CLEAN CATCH Performed at [facility], [place] [address], [place], [place] [address]    Special Requests   Final     NONE Performed at [facility] [place] [address], [place], [place] [address]    Culture 70,000 COLONIES/mL ENTEROCOCCUS FAECALIS (A)  Final   Report Status 07/07/2019 FINAL  Final   Organism ID, Bacteria ENTEROCOCCUS FAECALIS (A)  Final      Susceptibility   Enterococcus faecalis - MIC*    AMPICILLIN RESISTANT Resistant     NITROFURANTOIN <=16 SENSITIVE Sensitive     VANCOMYCIN 1 SENSITIVE Sensitive     * 70,000 COLONIES/mL ENTEROCOCCUS FAECALIS  MRSA PCR Screening     Status: None   Collection Time: 07/06/19  2:53 AM   Specimen: Nasal Mucosa; Nasopharyngeal  Result Value Ref Range Status   MRSA by PCR NEGATIVE NEGATIVE Final    Comment:        The GeneXpert MRSA Assay (FDA approved for NASAL specimens only), is one component of a comprehensive MRSA colonization surveillance program. It is not intended to diagnose MRSA infection nor to guide or monitor treatment for MRSA infections. Performed at [facility], [place] [address], [place],  [address]      Radiology Studies: No results found.  Scheduled Meds: . aspirin EC  81 mg Oral QPM  . clopidogrel  75 mg Oral Daily  . docusate sodium  100 mg Oral BID  . enoxaparin (LOVENOX) injection  40 mg Subcutaneous Q24H  . feeding supplement (ENSURE ENLIVE)  237 mL Oral BID BM  . Gerhardt's butt cream   Topical BID  . ipratropium-albuterol  3 mL Nebulization BID  . isosorbide mononitrate  30 mg Oral Daily  . levothyroxine  88 mcg Oral Q0600  . losartan  50 mg Oral BID  . metoprolol succinate  25 mg Oral Daily  . pantoprazole  40 mg Oral Daily  . potassium chloride  40 mEq Oral BID  . rosuvastatin  20 mg Oral Daily  . sertraline  50 mg Oral QPM   Continuous Infusions:   LOS: 2 days   Marylu Lund, MD [facility] Pager On Amion  If 7PM-7AM, please contact night-coverage 07/08/2019, 4:55 PM

## 2019-07-08 NOTE — Plan of Care (Signed)
  Problem: Education: Goal: Knowledge of General Education information will improve Description: Including pain rating scale, medication(s)/side effects and non-pharmacologic comfort measures Outcome: Progressing   Problem: Clinical Measurements: Goal: Respiratory complications will improve Outcome: Progressing   Problem: Coping: Goal: Level of anxiety will decrease Outcome: Progressing   Problem: Elimination: Goal: Will not experience complications related to bowel motility Outcome: Progressing Goal: Will not experience complications related to urinary retention Outcome: Progressing   Problem: Pain Managment: Goal: General experience of comfort will improve Outcome: Progressing   Problem: Safety: Goal: Ability to remain free from injury will improve Outcome: Progressing

## 2019-07-08 NOTE — Progress Notes (Signed)
SATURATION QUALIFICATIONS: (This note is used to comply with regulatory documentation for home oxygen)  Patient Saturations on Room Air at Rest = 88  Patient Saturations on Room Air while Ambulating = 90 while resting. Pt cannot ambulate well at this time   Patient Saturations on 2 Liters of oxygen while at rest = 95  Please briefly explain why patient needs home oxygen:  Pt 02 sat decreased to 88 percent on room air while eating lunch. She was more short of breath without 02 as well while trying to eat. RN did notify md.

## 2019-07-09 ENCOUNTER — Other Ambulatory Visit: Payer: Self-pay | Admitting: Cardiovascular Disease

## 2019-07-09 DIAGNOSIS — S82832A Other fracture of upper and lower end of left fibula, initial encounter for closed fracture: Secondary | ICD-10-CM

## 2019-07-09 LAB — COMPREHENSIVE METABOLIC PANEL
ALT: 16 U/L (ref 0–44)
AST: 15 U/L (ref 15–41)
Albumin: 3.1 g/dL — ABNORMAL LOW (ref 3.5–5.0)
Alkaline Phosphatase: 42 U/L (ref 38–126)
Anion gap: 9 (ref 5–15)
BUN: 15 mg/dL (ref 8–23)
CO2: 29 mmol/L (ref 22–32)
Calcium: 8.8 mg/dL — ABNORMAL LOW (ref 8.9–10.3)
Chloride: 97 mmol/L — ABNORMAL LOW (ref 98–111)
Creatinine, Ser: 0.37 mg/dL — ABNORMAL LOW (ref 0.44–1.00)
GFR calc Af Amer: >60 mL/min (ref >60)
GFR calc non Af Amer: >60 mL/min (ref >60)
Glucose, Bld: 157 mg/dL — ABNORMAL HIGH (ref 70–99)
Potassium: 4.7 mmol/L (ref 3.5–5.1)
Sodium: 135 mmol/L (ref 135–145)
Total Bilirubin: 0.3 mg/dL (ref 0.3–1.2)
Total Protein: 6.4 g/dL — ABNORMAL LOW (ref 6.5–8.1)

## 2019-07-09 LAB — CBC
HCT: 34.4 % — ABNORMAL LOW (ref 36.0–46.0)
Hemoglobin: 11.1 g/dL — ABNORMAL LOW (ref 12.0–15.0)
MCH: 31.4 pg (ref 26.0–34.0)
MCHC: 32.3 g/dL (ref 30.0–36.0)
MCV: 97.5 fL (ref 80.0–100.0)
Platelets: 262 10*3/uL (ref 150–400)
RBC: 3.53 MIL/uL — ABNORMAL LOW (ref 3.87–5.11)
RDW: 12.3 % (ref 11.5–15.5)
WBC: 7.6 10*3/uL (ref 4.0–10.5)
nRBC: 0 % (ref 0.0–0.2)

## 2019-07-09 MED ORDER — PREDNISONE 10 MG PO TABS: ORAL_TABLET | ORAL | Status: AC

## 2019-07-09 MED ORDER — IPRATROPIUM-ALBUTEROL 0.5-2.5 (3) MG/3ML IN SOLN: 3.0000 mL | RESPIRATORY_TRACT | 0 refills | Status: AC | PRN

## 2019-07-09 MED ORDER — PROAIR RESPICLICK 108 (90 BASE) MCG/ACT IN AEPB: 2.0000 | INHALATION_SPRAY | Freq: Every day | RESPIRATORY_TRACT | 0 refills | Status: AC | PRN

## 2019-07-09 NOTE — Discharge Summary (Addendum)
Physician Discharge Summary  Gina Bowers [id number] DOB: 09/09/1925 DOA: 07/05/2019  PCP: Lavone Orn, MD  Admit date: 07/05/2019 Discharge date: 07/09/2019  Admitted From: Independent living Disposition:  Independent living  Recommendations for Outpatient Follow-up:  1. Follow up with PCP in 1-2 weeks  Discharge Condition:Improved CODE STATUS:DNR Diet recommendation: Soft   Brief/Interim Summary: 86 y.o.femalewith medical history significant forCADs/pCABG, PAD, chronic diastolic CHF, hypertension, hyperlipidemia, hypothyroidism, CVA on Plavix,left hip replacementwho presents s/p fall.  Patient alert and oriented only to self and location but otherwise unable to provide history.  Entirety of history obtained from ER physician and granddaughter at bedside. Granddaughter states that she has noticed a progressive decline in her mental state for the past 3 weeks and has noticed anacute change since 4 PM today. She was recentlytreated for a UTIWith Levaquinand is kept on chronic antibiotic for recurrent UTIs.Since thenhas had progressive weakness and sustained a fall on Sunday. Normally walks with cane.Since her first fallshe has not wanted to ambulate and again had a fall today prompting ED evaluation. Patient at baseline per granddaughter is alert and oriented x4 and frequently does word puzzles,playsjeopardy and is very conversive.  Granddaughter denies her having any fever or chills but has noticed an increased tremor of both of her hands since the falls. Denies having any complaints of chest pain, shortness of breath. No cough or runny nose. No focal neurological deficits. Patient has had a good appetite and denies any nausea, vomiting diarrhea or constipation.  In the ED, she was afebrile and hypertensive up to systolic of [id number] and required 2.5L via [place] with oxygen desaturationdownto 88% on room air. Lab work significant for elevated WBC of 13.4,  hemoglobin of 11.8.  K of 6.3, glucose of 114. Creatinine normal at 0.70.    CT head negative. CT cervical negative for fracture.  CTA chest negative for PE. Showed chronic ILD and cardiomegaly.  X-ray of left hip negative X-ray of left knee shows left fibular neck fracture with moderate to large joint effusion Left ankle X-ray with small avulsion fracture off the tip of the lateral malleolus  ED physician consultedon-call orthopedics Dr. Gordy Levan patient's films. He recommends patient can be weight bearing as tolerated. If needed she can wear knee immobilizer but does not necessarily have to. If admitted to the hospital he would recommend PT/OT eval.  She was given albuterol neb, insulin 5 units and D50 for her hyperkalemia.   Discharge Diagnoses:  Principal Problem:   Fall Active Problems:   Hypothyroidism   Anxiety state   Coronary artery disease involving coronary bypass graft of native heart   Dilatation of thoracic aorta (HCC)   Altered mental status   Hyperkalemia   Avulsion fracture of lateral malleolus   Left fibular fracture   Chronic UTI   Hx of CABG   History of CVA (cerebrovascular accident)  Altered mental status, toxic metabolic encephalopathy CT head reviewed, unremarkable Checked VBG reviewed, unremarkable UA suggestive of UTI. See below Remains afebrile On discussion with grand-daughter at bedside 2/12, family reported patient seems much improved  Acute hypoxic respiratory failure possibly acute on chronic worsening of ILD seen on CTA chest no PE, no findings on imaging or exam to suggest acute CHF Continued on duoneb q6hr as tolerated Presently on minimal O2 support Weaned to room air. Pt did receive trial of lasix and prednisone. Improvement noted Bronchiolar thickening noted on CT chest, thus will give brief trial of prednisone taper on d/c  small avulsion fracture  off the tip of the left lateral malleolus  Left fibular neck  fracture with moderate to large joint effusion s/p mechanical fall from weakness Ortho consulted by ED physician  Ortho recommendation for weight bearing as tolerated Knee and ankle immobilizer in place PT/OT consulted, recommendation for SNF. Discussed with family who would prefer eventual d/c home. Family states they can provide 24hr supervision Seen by [facility] arranged  Hyperkalemia Presenting K of 6.2 from prior normal. No EKG changes. received insulin 5 units and D50 in the ED Potassium replaced  Leukocytosis Improving Likely related to enterococcus UTI below Also possibly reactive secondary to above avulsion fracture Normalized  Acute on chronic UTI Was continued on empiric IV ampicillin given hx or recurrent enterococcus UTI Urine cx growing enterococcus resistant to ampicillin Discussed with Pharmacy, given trial of fosfomycin x1 Remains stable  Chronic anemia Remains stable in the 11 range Hemodynamically stable at this time Recheck cbc in AM  Ascending thoracic aortic dilatation Incidential finding of CTA chest 4cm Recommendation for annual follow up by CTA or MRA   Hx of CVA CT head negative and no focal neurological deficit continue aspirin and plavix as pt tolerates  Hx of CAD s/p CABG  continue Imdur, losartan, metoprolol No chest pain this AM, remains stable  Anxiety continue Zoloft as tolerated  Hypothyroidism Continue levothyroxine TSH of 0.999  Discharge Instructions   Allergies as of 07/09/2019      Reactions   Meclizine Other (See Comments)   Caused cognitive impairment   Nitrofurantoin Nausea And Vomiting, Other (See Comments)   Also caused hallucinations   Sulfa Antibiotics Hives   Sulfonamide Derivatives Hives      Medication List    STOP taking these medications   cephALEXin 250 MG capsule Commonly known as: KEFLEX   oxyCODONE 5 MG immediate release tablet Commonly known as: Oxy IR/ROXICODONE    senna-docusate 8.6-50 MG tablet Commonly known as: Senokot-S   traMADol 50 MG tablet Commonly known as: ULTRAM     TAKE these medications   acetaminophen 500 MG tablet Commonly known as: TYLENOL Take 1,000 mg by mouth every 6 (six) hours as needed for mild pain or fever.   alendronate 70 MG tablet Commonly known as: FOSAMAX Take 1 tablet (70 mg total) by mouth once a week. Take with a full glass of water on an empty stomach.mon What changed:   when to take this  additional instructions   aspirin EC 81 MG tablet Take 81 mg by mouth every evening.   clopidogrel 75 MG tablet Commonly known as: PLAVIX Take 1 tablet (75 mg total) by mouth daily.   Cranberry 500 MG Caps Take 500 mg by mouth 2 (two) times a day.   D-Mannose 500 MG Caps Take 500 mg by mouth 2 (two) times a day.   docusate sodium 100 MG capsule Commonly known as: COLACE Take 100 mg by mouth 2 (two) times daily.   fish oil-omega-3 fatty acids 1000 MG capsule Take 1 g by mouth every evening.   IMMUPLEX LOZENGE MT Take 1 lozenge by mouth 2 (two) times a day. Morning and Evening   ipratropium-albuterol 0.5-2.5 (3) MG/3ML Soln Commonly known as: DUONEB Take 3 mLs by nebulization every 4 (four) hours as needed.   isosorbide mononitrate 30 MG 24 hr tablet Commonly known as: IMDUR Take 0.5 tablets (15 mg total) by mouth daily.   levothyroxine 88 MCG tablet Commonly known as: SYNTHROID Take 1 tablet (88 mcg total) by mouth  daily before breakfast.   losartan 50 MG tablet Commonly known as: COZAAR Take 1 tablet (50 mg total) by mouth 2 (two) times daily.   Mega Probiotic Caps Take 1 capsule by mouth daily with breakfast. Mega Food/MegaFloraWomen's Probiotic   metoprolol succinate 25 MG 24 hr tablet Commonly known as: TOPROL-XL Take 1 tablet (25 mg total) by mouth daily.   nitroGLYCERIN 0.4 MG SL tablet Commonly known as: NITROSTAT Place 1 tablet (0.4 mg total) under the tongue every 5 (five) minutes  as needed for chest pain.   NON FORMULARY Take 1 tablet by mouth See admin instructions. Bone Strength tablets (D-3, Calcium, K-1, K-2, Vanadium, Silicon, Strontium, Magnesium) Take 2 tablets by mouth in the evening   ondansetron 4 MG disintegrating tablet Commonly known as: Zofran ODT Take 1 tablet (4 mg total) by mouth every 6 (six) hours as needed for nausea or vomiting.   One-A-Day Proactive 65+ Tabs Take 1 tablet by mouth daily with breakfast.   pantoprazole 40 MG tablet Commonly known as: PROTONIX Take 1 tablet (40 mg total) by mouth daily.   predniSONE 10 MG tablet Commonly known as: DELTASONE Taper dose: 40mg  po daily x 2 days, then 20mg  po daily x 2 days, then 10mg  po daily x 2 days, then 5mg  po daily x 2 days, then stop, zero refills   ProAir RespiClick [id number] (90 Base) MCG/ACT Aepb Generic drug: Albuterol Sulfate Inhale 2 puffs into the lungs daily as needed (for shortness of breath).   rosuvastatin 20 MG tablet Commonly known as: CRESTOR Take 1 tablet (20 mg total) by mouth daily.   sertraline 50 MG tablet Commonly known as: ZOLOFT Take 1 tablet (50 mg total) by mouth daily. What changed: when to take this   Systane 0.4-0.3 % Gel ophthalmic gel Generic drug: Polyethyl Glycol-Propyl Glycol Place 1 application into both eyes 2 (two) times a day. Morning and Bedtime      Follow-up Information    Lavone Orn, MD. Schedule an appointment as soon as possible for a visit in 1 week(s).   Specialty: Internal Medicine Contact information: [address], [address] [place] [address] [phone]          Allergies  Allergen Reactions  . Meclizine Other (See Comments)    Caused cognitive impairment  . Nitrofurantoin Nausea And Vomiting and Other (See Comments)    Also caused hallucinations  . Sulfa Antibiotics Hives  . Sulfonamide Derivatives Hives    Consultations:  Discussed case with ID over phone  Procedures/Studies: DG Ankle Complete  Left  Result Date: 07/05/2019 CLINICAL DATA:  Left ankle pain, fell, lateral swelling EXAM: LEFT ANKLE COMPLETE - 3+ VIEW COMPARISON:  None. FINDINGS: Frontal, oblique, and lateral views of the left ankle are obtained. There is a small avulsion fracture at the tip the lateral malleolus. Mild overlying soft tissue edema. There is diffuse osteoarthritis of the ankle, hindfoot, and midfoot. Prominent calcaneal spurs are noted. IMPRESSION: 1. Small avulsion fracture off the tip of the lateral malleolus. 2. Diffuse osteoarthritis. Electronically Signed   By: Randa Ngo M.D.   On: 07/05/2019 23:01   CT Head Wo Contrast  Result Date: 07/05/2019 CLINICAL DATA:  Fall.  On Plavix. EXAM: CT HEAD WITHOUT CONTRAST TECHNIQUE: Contiguous axial images were obtained from the base of the skull through the vertex without intravenous contrast. COMPARISON:  12/06/2018 FINDINGS: Brain: There is atrophy and chronic small vessel disease changes. No acute intracranial abnormality. Specifically, no hemorrhage, hydrocephalus, mass lesion, acute  infarction, or significant intracranial injury. Vascular: No hyperdense vessel or unexpected calcification. Skull: No acute calvarial abnormality. Sinuses/Orbits: No acute findings Other: None IMPRESSION: Atrophy, chronic microvascular disease. No acute intracranial abnormality. Electronically Signed   By: Rolm Baptise M.D.   On: 07/05/2019 20:35   CT Angio Chest PE W and/or Wo Contrast  Addendum Date: 07/05/2019   ADDENDUM REPORT: 07/05/2019 23:34 ADDENDUM: Ascending thoracic aortic dilatation up to 4 cm. Recommend annual imaging followup by CTA or MRA. This recommendation follows 2010 ACCF/AHA/AATS/ACR/ASA/SCA/SCAI/SIR/STS/SVM Guidelines for the Diagnosis and Management of Patients with Thoracic Aortic Disease. Circulation. 2010; [id number]. Aortic aneurysm NOS (ICD10-I71.9) This addendum was called by telephone at the time of interpretation on 07/05/2019 at 11:34 pm to provider  [facility] , who verbally acknowledged these results. Electronically Signed   By: Lovena Le M.D.   On: 07/05/2019 23:34   Result Date: 07/05/2019 CLINICAL DATA:  Concern for PE, hypoxia and shortness of breath EXAM: CT ANGIOGRAPHY CHEST WITH CONTRAST TECHNIQUE: Multidetector CT imaging of the chest was performed using the standard protocol during bolus administration of intravenous contrast. Multiplanar CT image reconstructions and MIPs were obtained to evaluate the vascular anatomy. CONTRAST:  179mL OMNIPAQUE IOHEXOL 350 MG/ML SOLN COMPARISON:  CT 06/12/2016 FINDINGS: Cardiovascular: Satisfactory opacification of the pulmonary arteries without large central or lobar filling defects. More distal evaluation is limited by respiratory motion and cardiac pulsation artifact. Mild cardiomegaly. Extensive coronary artery calcifications and postsurgical changes related to prior CABG. No pericardial effusion. Borderline dilation of the ascending thoracic aorta measuring up to 4 cm in diameter. Atherosclerotic calcification present within the thoracic aorta and normally branching proximal great vessels. Mediastinum/Nodes: Postsurgical changes in the anterior mediastinum likely related prior CABG. There is a moderate hiatal hernia. The thoracic esophagus is fluid-filled and patulous. Tortuosity of the trachea is similar to comparison studies. Posterior bowing likely related to imaging during exhalation. Mild diffuse airways thickening is noted. Thyroid gland is diminutive which may reflect prior surgery or radioactive iodine therapy. Lungs/Pleura: Evaluation of the lung parenchyma is limited due to extensive respiratory motion artifact. Subpleural predominant reticular changes are better assessed on comparison exam with diffuse airways thickening and bronchiectatic changes. Mild bronchiectatic changes and mucous plugging. No focal consolidative opacity is seen. No pneumothorax or effusion. Upper Abdomen: No acute  abnormalities present in the visualized portions of the upper abdomen. Of enlarging fluid attenuation cyst in the upper pole left kidney now measuring up to 6 cm in diameter (previously 4.6 cm. No worrisome renal lesion is seen however. Reflux of contrast into the hepatic veins may reflect elevated right heart pressures. Musculoskeletal: Exaggerated thoracic kyphosis. Multilevel discogenic and facet degenerative changes are present. Prior vertebroplasty changes at T10 are similar to prior with small amount of cement within the T9-T10 disc space. Remote right third anterior rib deformity. Increased AP diameter of the chest. Additional degenerative changes noted in the shoulders. No suspicious osseous lesions. Review of the MIP images confirms the above findings. IMPRESSION: 1. No evidence of acute central or lobar pulmonary embolism. More distal evaluation is limited by respiratory motion and cardiac pulsation artifact. 2. Chronic interstitial lung disease with associated airways thickening and bronchiectatic changes. No focal consolidative opacity to suggest pneumonia. 3. Moderate hiatal hernia with fluid-filled and patulous thoracic esophagus, correlate with features of reflux. 4. Cardiomegaly. Reflux of contrast into the hepatic veins may reflect elevated right heart pressures. 5. Aortic Atherosclerosis (ICD10-I70.0). Electronically Signed: By: Lovena Le M.D. On: 07/05/2019 23:08  CT Cervical Spine Wo Contrast  Result Date: 07/05/2019 CLINICAL DATA:  Fall EXAM: CT CERVICAL SPINE WITHOUT CONTRAST TECHNIQUE: Multidetector CT imaging of the cervical spine was performed without intravenous contrast. Multiplanar CT image reconstructions were also generated. COMPARISON:  06/08/2017 FINDINGS: Alignment: Normal Skull base and vertebrae: No acute fracture. No primary bone lesion or focal pathologic process. Soft tissues and spinal canal: No prevertebral fluid or swelling. No visible canal hematoma. Disc levels:  Moderate to advanced diffuse degenerative facet disease, left greater than right. Disc spaces maintained. Mild anterior spurring Upper chest: No acute findings Other: None IMPRESSION: Degenerative disc and advanced degenerative facet disease. No acute bony abnormality. Electronically Signed   By: Rolm Baptise M.D.   On: 07/05/2019 20:37   DG Chest Portable 1 View  Result Date: 07/05/2019 CLINICAL DATA:  Fall.  Left hip and knee pain EXAM: PORTABLE CHEST 1 VIEW COMPARISON:  12/06/2018 FINDINGS: Prior CABG. Low lung volumes. No confluent opacities, effusions or edema. Heart is normal size. No acute bony abnormality. IMPRESSION: No active disease. Electronically Signed   By: Rolm Baptise M.D.   On: 07/05/2019 20:29   DG Knee Complete 4 Views Left  Result Date: 07/05/2019 CLINICAL DATA:  Fall.  Left knee pain EXAM: LEFT KNEE - COMPLETE 4+ VIEW COMPARISON:  None. FINDINGS: There is a nondisplaced fracture through the proximal left fibular metaphysis. No additional fracture seen. For moderate to large joint effusion. Joint spaces maintained. IMPRESSION: Left fibular neck fracture. Moderate to large joint effusion. Electronically Signed   By: Rolm Baptise M.D.   On: 07/05/2019 20:28   DG Hip Unilat W or Wo Pelvis 2-3 Views Left  Result Date: 07/05/2019 CLINICAL DATA:  Fall, left hip pain EXAM: DG HIP (WITH OR WITHOUT PELVIS) 2-3V LEFT COMPARISON:  01/31/2019 FINDINGS: Patient is status post left hip replacement. Old healed left inferior pubic ramus fracture. No acute fracture, subluxation or dislocation. No hardware complicating feature. IMPRESSION: Left hip replacement. No acute bony abnormality. Electronically Signed   By: Rolm Baptise M.D.   On: 07/05/2019 20:28    Subjective: Eager to be discharged  Discharge Exam: Vitals:   07/09/19 1030 07/09/19 1035  BP: (!) 151/68   Pulse: 63 63  Resp: 16 16  Temp:    SpO2: 98% 95%   Vitals:   07/08/19 2123 07/09/19 0452 07/09/19 1030 07/09/19 1035  BP:  (!) 152/80 (!) 165/75 (!) 151/68   Pulse: 74 71 63 63  Resp: 20  16 16   Temp: 98.7 F (37.1 C) (!) 97.5 F (36.4 C)    TempSrc: Oral Oral    SpO2: 94% 95% 98% 95%  Weight:      Height:        General: Pt is alert, awake, not in acute distress Cardiovascular: RRR, S1/S2 +, no rubs, no gallops Respiratory: CTA bilaterally, no wheezing, no rhonchi Abdominal: Soft, NT, ND, bowel sounds + Extremities: no edema, no cyanosis   The results of significant diagnostics from this hospitalization (including imaging, microbiology, ancillary and laboratory) are listed below for reference.     Microbiology: Recent Results (from the past 240 hour(s))  Respiratory Panel by RT PCR (Flu A&B, Covid) - Nasopharyngeal Swab     Status: None   Collection Time: 07/05/19  7:54 PM   Specimen: Nasopharyngeal Swab  Result Value Ref Range Status   SARS Coronavirus 2 by RT PCR NEGATIVE NEGATIVE Final    Comment: (NOTE) SARS-CoV-2 target nucleic acids are NOT DETECTED. The SARS-CoV-2  RNA is generally detectable in upper respiratoy specimens during the acute phase of infection. The lowest concentration of SARS-CoV-2 viral copies this assay can detect is 131 copies/mL. A negative result does not preclude SARS-Cov-2 infection and should not be used as the sole basis for treatment or other patient management decisions. A negative result may occur with  improper specimen collection/handling, submission of specimen other than nasopharyngeal swab, presence of viral mutation(s) within the areas targeted by this assay, and inadequate number of viral copies (<131 copies/mL). A negative result must be combined with clinical observations, patient history, and epidemiological information. The expected result is Negative. Fact Sheet for Patients:  PinkCheek.be Fact Sheet for Healthcare Providers:  GravelBags.it This test is not yet ap proved or cleared by the  [place] FDA and  has been authorized for detection and/or diagnosis of SARS-CoV-2 by FDA under an Emergency Use Authorization (EUA). This EUA will remain  in effect (meaning this test can be used) for the duration of the COVID-19 declaration under Section 564(b)(1) of the Act, 21 U.S.C. section 360bbb-3(b)(1), unless the authorization is terminated or revoked sooner.    Influenza A by PCR NEGATIVE NEGATIVE Final   Influenza B by PCR NEGATIVE NEGATIVE Final    Comment: (NOTE) The Xpert Xpress SARS-CoV-2/FLU/RSV assay is intended as an aid in  the diagnosis of influenza from Nasopharyngeal swab specimens and  should not be used as a sole basis for treatment. Nasal washings and  aspirates are unacceptable for Xpert Xpress SARS-CoV-2/FLU/RSV  testing. Fact Sheet for Patients: PinkCheek.be Fact Sheet for Healthcare Providers: GravelBags.it This test is not yet approved or cleared by the [place] FDA and  has been authorized for detection and/or diagnosis of SARS-CoV-2 by  FDA under an Emergency Use Authorization (EUA). This EUA will remain  in effect (meaning this test can be used) for the duration of the  Covid-19 declaration under Section 564(b)(1) of the Act, 21  U.S.C. section 360bbb-3(b)(1), unless the authorization is  terminated or revoked. Performed at [facility], [place] [address] [place], [place] [address]   Culture, Urine     Status: Abnormal   Collection Time: 07/06/19  1:19 AM   Specimen: Urine, Clean Catch  Result Value Ref Range Status   Specimen Description   Final    URINE, CLEAN CATCH Performed at [facility], [place] [address] [place], [place] [address]    Special Requests   Final    NONE Performed at [facility], [place] [address] [place], [place] [address]    Culture 70,000 COLONIES/mL ENTEROCOCCUS FAECALIS (A)  Final   Report Status  07/07/2019 FINAL  Final   Organism ID, Bacteria ENTEROCOCCUS FAECALIS (A)  Final      Susceptibility   Enterococcus faecalis - MIC*    AMPICILLIN RESISTANT Resistant     NITROFURANTOIN <=16 SENSITIVE Sensitive     VANCOMYCIN 1 SENSITIVE Sensitive     * 70,000 COLONIES/mL ENTEROCOCCUS FAECALIS  MRSA PCR Screening     Status: None   Collection Time: 07/06/19  2:53 AM   Specimen: Nasal Mucosa; Nasopharyngeal  Result Value Ref Range Status   MRSA by PCR NEGATIVE NEGATIVE Final    Comment:        The GeneXpert MRSA Assay (FDA approved for NASAL specimens only), is one component of a comprehensive MRSA colonization surveillance program. It is not intended to diagnose MRSA infection nor to guide or monitor treatment for MRSA infections. Performed at  [facility], [place] [address] [place], [place] [address]      Labs: BNP (last 3 results) Recent Labs    07/05/19 2115  BNP [id number]*   Basic Metabolic Panel: Recent Labs  Lab 07/05/19 2115 07/05/19 2115 07/06/19 0119 07/06/19 0356 07/07/19 0418 07/08/19 0418 07/09/19 0530  NA 136  --   --  136 133* 136 135  K 6.2*   < > 3.2* 3.4* 3.3* 3.3* 4.7  CL 98  --   --  100 95* 98 97*  CO2 27  --   --  27 30 29 29   GLUCOSE 114*  --   --  147* 137* 101* 157*  BUN 14  --   --  11 15 10 15   CREATININE 0.70  --   --  0.52 0.42* 0.38* 0.37*  CALCIUM 9.0  --   --  8.8* 8.4* 8.4* 8.8*   < > = values in this interval not displayed.   Liver Function Tests: Recent Labs  Lab 07/07/19 0418 07/08/19 0418 07/09/19 0530  AST 19 18 15   ALT 16 14 16   ALKPHOS 45 37* 42  BILITOT 0.9 0.4 0.3  PROT 6.1* 5.4* 6.4*  ALBUMIN 2.9* 2.7* 3.1*   No results for input(s): LIPASE, AMYLASE in the last 168 hours. No results for input(s): AMMONIA in the last 168 hours. CBC: Recent Labs  Lab 07/05/19 2115 07/06/19 0356 07/07/19 0418 07/08/19 0418 07/09/19 0530  WBC 13.4* 11.2* 11.5* 9.2 7.6  NEUTROABS 10.5*  --   --   --   --    HGB 11.8* 11.0* 10.3* 9.5* 11.1*  HCT 36.3 34.5* 32.2* 29.8* 34.4*  MCV 98.6 99.4 97.3 99.3 97.5  PLT 270 236 228 202 262   Cardiac Enzymes: Recent Labs  Lab 07/06/19 0119  CKTOTAL 20*   BNP: Invalid input(s): POCBNP CBG: No results for input(s): GLUCAP in the last 168 hours. D-Dimer No results for input(s): DDIMER in the last 72 hours. Hgb A1c No results for input(s): HGBA1C in the last 72 hours. Lipid Profile No results for input(s): CHOL, HDL, LDLCALC, TRIG, CHOLHDL, LDLDIRECT in the last 72 hours. Thyroid function studies No results for input(s): TSH, T4TOTAL, T3FREE, THYROIDAB in the last 72 hours.  Invalid input(s): FREET3 Anemia work up No results for input(s): VITAMINB12, FOLATE, FERRITIN, TIBC, IRON, RETICCTPCT in the last 72 hours. Urinalysis    Component Value Date/Time   COLORURINE YELLOW 07/06/2019 0119   APPEARANCEUR CLEAR 07/06/2019 0119   LABSPEC 1.041 (H) 07/06/2019 0119   PHURINE 7.0 07/06/2019 0119   GLUCOSEU 150 (A) 07/06/2019 0119   HGBUR NEGATIVE 07/06/2019 0119   BILIRUBINUR NEGATIVE 07/06/2019 0119   KETONESUR NEGATIVE 07/06/2019 0119   PROTEINUR NEGATIVE 07/06/2019 0119   UROBILINOGEN 0.2 07/12/2012 1448   NITRITE NEGATIVE 07/06/2019 0119   LEUKOCYTESUR TRACE (A) 07/06/2019 0119   [facility] Invalid input(s): PROCALCITONIN,  WBC,  LACTICIDVEN Microbiology Recent Results (from the past 240 hour(s))  Respiratory Panel by RT PCR (Flu A&B, Covid) - Nasopharyngeal Swab     Status: None   Collection Time: 07/05/19  7:54 PM   Specimen: Nasopharyngeal Swab  Result Value Ref Range Status   SARS Coronavirus 2 by RT PCR NEGATIVE NEGATIVE Final    Comment: (NOTE) SARS-CoV-2 target nucleic acids are NOT DETECTED. The SARS-CoV-2 RNA is generally detectable in upper respiratoy specimens during the acute phase of infection. The lowest concentration of SARS-CoV-2 viral copies this assay can detect is 131  copies/mL. A negative result does not  preclude SARS-Cov-2 infection and should not be used as the sole basis for treatment or other patient management decisions. A negative result may occur with  improper specimen collection/handling, submission of specimen other than nasopharyngeal swab, presence of viral mutation(s) within the areas targeted by this assay, and inadequate number of viral copies (<131 copies/mL). A negative result must be combined with clinical observations, patient history, and epidemiological information. The expected result is Negative. Fact Sheet for Patients:  PinkCheek.be Fact Sheet for Healthcare Providers:  GravelBags.it This test is not yet ap proved or cleared by the [place] FDA and  has been authorized for detection and/or diagnosis of SARS-CoV-2 by FDA under an Emergency Use Authorization (EUA). This EUA will remain  in effect (meaning this test can be used) for the duration of the COVID-19 declaration under Section 564(b)(1) of the Act, 21 U.S.C. section 360bbb-3(b)(1), unless the authorization is terminated or revoked sooner.    Influenza A by PCR NEGATIVE NEGATIVE Final   Influenza B by PCR NEGATIVE NEGATIVE Final    Comment: (NOTE) The Xpert Xpress SARS-CoV-2/FLU/RSV assay is intended as an aid in  the diagnosis of influenza from Nasopharyngeal swab specimens and  should not be used as a sole basis for treatment. Nasal washings and  aspirates are unacceptable for Xpert Xpress SARS-CoV-2/FLU/RSV  testing. Fact Sheet for Patients: PinkCheek.be Fact Sheet for Healthcare Providers: GravelBags.it This test is not yet approved or cleared by the [place] FDA and  has been authorized for detection and/or diagnosis of SARS-CoV-2 by  FDA under an Emergency Use Authorization (EUA). This EUA will remain  in effect (meaning this test can be used) for the duration of the   Covid-19 declaration under Section 564(b)(1) of the Act, 21  U.S.C. section 360bbb-3(b)(1), unless the authorization is  terminated or revoked. Performed at [facility], [place] [address], [place], [place] [address]   Culture, Urine     Status: Abnormal   Collection Time: 07/06/19  1:19 AM   Specimen: Urine, Clean Catch  Result Value Ref Range Status   Specimen Description   Final    URINE, CLEAN CATCH Performed at [facility], [place] [address], [place], [place] [address]    Special Requests   Final    NONE Performed at [facility], [place] [address] [place],  [address]    Culture 70,000 COLONIES/mL ENTEROCOCCUS FAECALIS (A)  Final   Report Status 07/07/2019 FINAL  Final   Organism ID, Bacteria ENTEROCOCCUS FAECALIS (A)  Final      Susceptibility   Enterococcus faecalis - MIC*    AMPICILLIN RESISTANT Resistant     NITROFURANTOIN <=16 SENSITIVE Sensitive     VANCOMYCIN 1 SENSITIVE Sensitive     * 70,000 COLONIES/mL ENTEROCOCCUS FAECALIS  MRSA PCR Screening     Status: None   Collection Time: 07/06/19  2:53 AM   Specimen: Nasal Mucosa; Nasopharyngeal  Result Value Ref Range Status   MRSA by PCR NEGATIVE NEGATIVE Final    Comment:        The GeneXpert MRSA Assay (FDA approved for NASAL specimens only), is one component of a comprehensive MRSA colonization surveillance program. It is not intended to diagnose MRSA infection nor to guide or monitor treatment for MRSA infections. Performed at [facility], [place] [address] [place],  [address]    Time spent: 108min  SIGNED:   Marylu Lund, MD  [facility]  07/09/2019, 4:02 PM  If 7PM-7AM, please contact night-coverage

## 2019-07-09 NOTE — Progress Notes (Signed)
Update provided to patients granddaughter Gina Bowers regarding plan of care.

## 2019-07-13 DIAGNOSIS — R262 Difficulty in walking, not elsewhere classified: Secondary | ICD-10-CM | POA: Diagnosis not present

## 2019-07-13 DIAGNOSIS — S8265XS Nondisplaced fracture of lateral malleolus of left fibula, sequela: Secondary | ICD-10-CM | POA: Diagnosis not present

## 2019-07-13 DIAGNOSIS — R296 Repeated falls: Secondary | ICD-10-CM | POA: Diagnosis not present

## 2019-07-13 DIAGNOSIS — M6281 Muscle weakness (generalized): Secondary | ICD-10-CM | POA: Diagnosis not present

## 2019-07-17 DIAGNOSIS — R296 Repeated falls: Secondary | ICD-10-CM | POA: Diagnosis not present

## 2019-07-17 DIAGNOSIS — R262 Difficulty in walking, not elsewhere classified: Secondary | ICD-10-CM | POA: Diagnosis not present

## 2019-07-17 DIAGNOSIS — M6281 Muscle weakness (generalized): Secondary | ICD-10-CM | POA: Diagnosis not present

## 2019-07-17 DIAGNOSIS — S8265XS Nondisplaced fracture of lateral malleolus of left fibula, sequela: Secondary | ICD-10-CM | POA: Diagnosis not present

## 2019-07-18 DIAGNOSIS — R262 Difficulty in walking, not elsewhere classified: Secondary | ICD-10-CM | POA: Diagnosis not present

## 2019-07-18 DIAGNOSIS — S8265XS Nondisplaced fracture of lateral malleolus of left fibula, sequela: Secondary | ICD-10-CM | POA: Diagnosis not present

## 2019-07-18 DIAGNOSIS — M6281 Muscle weakness (generalized): Secondary | ICD-10-CM | POA: Diagnosis not present

## 2019-07-18 DIAGNOSIS — R296 Repeated falls: Secondary | ICD-10-CM | POA: Diagnosis not present

## 2019-07-19 DIAGNOSIS — S8265XS Nondisplaced fracture of lateral malleolus of left fibula, sequela: Secondary | ICD-10-CM | POA: Diagnosis not present

## 2019-07-19 DIAGNOSIS — M6281 Muscle weakness (generalized): Secondary | ICD-10-CM | POA: Diagnosis not present

## 2019-07-19 DIAGNOSIS — R262 Difficulty in walking, not elsewhere classified: Secondary | ICD-10-CM | POA: Diagnosis not present

## 2019-07-19 DIAGNOSIS — R296 Repeated falls: Secondary | ICD-10-CM | POA: Diagnosis not present

## 2019-07-20 DIAGNOSIS — M6281 Muscle weakness (generalized): Secondary | ICD-10-CM | POA: Diagnosis not present

## 2019-07-20 DIAGNOSIS — S8265XS Nondisplaced fracture of lateral malleolus of left fibula, sequela: Secondary | ICD-10-CM | POA: Diagnosis not present

## 2019-07-20 DIAGNOSIS — R296 Repeated falls: Secondary | ICD-10-CM | POA: Diagnosis not present

## 2019-07-20 DIAGNOSIS — R262 Difficulty in walking, not elsewhere classified: Secondary | ICD-10-CM | POA: Diagnosis not present

## 2019-07-21 DIAGNOSIS — R296 Repeated falls: Secondary | ICD-10-CM | POA: Diagnosis not present

## 2019-07-21 DIAGNOSIS — M6281 Muscle weakness (generalized): Secondary | ICD-10-CM | POA: Diagnosis not present

## 2019-07-21 DIAGNOSIS — Z8659 Personal history of other mental and behavioral disorders: Secondary | ICD-10-CM | POA: Diagnosis not present

## 2019-07-21 DIAGNOSIS — R32 Unspecified urinary incontinence: Secondary | ICD-10-CM | POA: Diagnosis not present

## 2019-07-21 DIAGNOSIS — R262 Difficulty in walking, not elsewhere classified: Secondary | ICD-10-CM | POA: Diagnosis not present

## 2019-07-21 DIAGNOSIS — J9621 Acute and chronic respiratory failure with hypoxia: Secondary | ICD-10-CM | POA: Diagnosis not present

## 2019-07-21 DIAGNOSIS — S82832D Other fracture of upper and lower end of left fibula, subsequent encounter for closed fracture with routine healing: Secondary | ICD-10-CM | POA: Diagnosis not present

## 2019-07-21 DIAGNOSIS — S8265XS Nondisplaced fracture of lateral malleolus of left fibula, sequela: Secondary | ICD-10-CM | POA: Diagnosis not present

## 2019-07-24 DIAGNOSIS — M6281 Muscle weakness (generalized): Secondary | ICD-10-CM | POA: Diagnosis not present

## 2019-07-24 DIAGNOSIS — S8265XS Nondisplaced fracture of lateral malleolus of left fibula, sequela: Secondary | ICD-10-CM | POA: Diagnosis not present

## 2019-07-24 DIAGNOSIS — R262 Difficulty in walking, not elsewhere classified: Secondary | ICD-10-CM | POA: Diagnosis not present

## 2019-07-24 DIAGNOSIS — R296 Repeated falls: Secondary | ICD-10-CM | POA: Diagnosis not present

## 2019-07-25 DIAGNOSIS — S8265XS Nondisplaced fracture of lateral malleolus of left fibula, sequela: Secondary | ICD-10-CM | POA: Diagnosis not present

## 2019-07-25 DIAGNOSIS — R296 Repeated falls: Secondary | ICD-10-CM | POA: Diagnosis not present

## 2019-07-25 DIAGNOSIS — R262 Difficulty in walking, not elsewhere classified: Secondary | ICD-10-CM | POA: Diagnosis not present

## 2019-07-25 DIAGNOSIS — M6281 Muscle weakness (generalized): Secondary | ICD-10-CM | POA: Diagnosis not present

## 2019-07-26 DIAGNOSIS — M6281 Muscle weakness (generalized): Secondary | ICD-10-CM | POA: Diagnosis not present

## 2019-07-26 DIAGNOSIS — R262 Difficulty in walking, not elsewhere classified: Secondary | ICD-10-CM | POA: Diagnosis not present

## 2019-07-26 DIAGNOSIS — S8265XS Nondisplaced fracture of lateral malleolus of left fibula, sequela: Secondary | ICD-10-CM | POA: Diagnosis not present

## 2019-07-26 DIAGNOSIS — R296 Repeated falls: Secondary | ICD-10-CM | POA: Diagnosis not present

## 2019-07-27 DIAGNOSIS — R262 Difficulty in walking, not elsewhere classified: Secondary | ICD-10-CM | POA: Diagnosis not present

## 2019-07-27 DIAGNOSIS — S8265XS Nondisplaced fracture of lateral malleolus of left fibula, sequela: Secondary | ICD-10-CM | POA: Diagnosis not present

## 2019-07-27 DIAGNOSIS — R296 Repeated falls: Secondary | ICD-10-CM | POA: Diagnosis not present

## 2019-07-27 DIAGNOSIS — M6281 Muscle weakness (generalized): Secondary | ICD-10-CM | POA: Diagnosis not present

## 2019-07-28 DIAGNOSIS — R296 Repeated falls: Secondary | ICD-10-CM | POA: Diagnosis not present

## 2019-07-28 DIAGNOSIS — R262 Difficulty in walking, not elsewhere classified: Secondary | ICD-10-CM | POA: Diagnosis not present

## 2019-07-28 DIAGNOSIS — S8265XS Nondisplaced fracture of lateral malleolus of left fibula, sequela: Secondary | ICD-10-CM | POA: Diagnosis not present

## 2019-07-28 DIAGNOSIS — M6281 Muscle weakness (generalized): Secondary | ICD-10-CM | POA: Diagnosis not present

## 2019-07-30 DIAGNOSIS — R296 Repeated falls: Secondary | ICD-10-CM | POA: Diagnosis not present

## 2019-07-30 DIAGNOSIS — R262 Difficulty in walking, not elsewhere classified: Secondary | ICD-10-CM | POA: Diagnosis not present

## 2019-07-30 DIAGNOSIS — S8265XS Nondisplaced fracture of lateral malleolus of left fibula, sequela: Secondary | ICD-10-CM | POA: Diagnosis not present

## 2019-07-30 DIAGNOSIS — M6281 Muscle weakness (generalized): Secondary | ICD-10-CM | POA: Diagnosis not present

## 2019-08-01 DIAGNOSIS — S8265XS Nondisplaced fracture of lateral malleolus of left fibula, sequela: Secondary | ICD-10-CM | POA: Diagnosis not present

## 2019-08-01 DIAGNOSIS — M6281 Muscle weakness (generalized): Secondary | ICD-10-CM | POA: Diagnosis not present

## 2019-08-01 DIAGNOSIS — R262 Difficulty in walking, not elsewhere classified: Secondary | ICD-10-CM | POA: Diagnosis not present

## 2019-08-01 DIAGNOSIS — R296 Repeated falls: Secondary | ICD-10-CM | POA: Diagnosis not present

## 2019-08-02 DIAGNOSIS — R296 Repeated falls: Secondary | ICD-10-CM | POA: Diagnosis not present

## 2019-08-02 DIAGNOSIS — R262 Difficulty in walking, not elsewhere classified: Secondary | ICD-10-CM | POA: Diagnosis not present

## 2019-08-02 DIAGNOSIS — M6281 Muscle weakness (generalized): Secondary | ICD-10-CM | POA: Diagnosis not present

## 2019-08-02 DIAGNOSIS — S8265XS Nondisplaced fracture of lateral malleolus of left fibula, sequela: Secondary | ICD-10-CM | POA: Diagnosis not present

## 2019-08-04 DIAGNOSIS — R296 Repeated falls: Secondary | ICD-10-CM | POA: Diagnosis not present

## 2019-08-04 DIAGNOSIS — M6281 Muscle weakness (generalized): Secondary | ICD-10-CM | POA: Diagnosis not present

## 2019-08-04 DIAGNOSIS — R262 Difficulty in walking, not elsewhere classified: Secondary | ICD-10-CM | POA: Diagnosis not present

## 2019-08-04 DIAGNOSIS — S8265XS Nondisplaced fracture of lateral malleolus of left fibula, sequela: Secondary | ICD-10-CM | POA: Diagnosis not present

## 2019-08-09 DIAGNOSIS — S8265XS Nondisplaced fracture of lateral malleolus of left fibula, sequela: Secondary | ICD-10-CM | POA: Diagnosis not present

## 2019-08-09 DIAGNOSIS — R262 Difficulty in walking, not elsewhere classified: Secondary | ICD-10-CM | POA: Diagnosis not present

## 2019-08-09 DIAGNOSIS — M6281 Muscle weakness (generalized): Secondary | ICD-10-CM | POA: Diagnosis not present

## 2019-08-09 DIAGNOSIS — R32 Unspecified urinary incontinence: Secondary | ICD-10-CM | POA: Diagnosis not present

## 2019-08-09 DIAGNOSIS — R296 Repeated falls: Secondary | ICD-10-CM | POA: Diagnosis not present

## 2019-08-10 DIAGNOSIS — R296 Repeated falls: Secondary | ICD-10-CM | POA: Diagnosis not present

## 2019-08-10 DIAGNOSIS — M6281 Muscle weakness (generalized): Secondary | ICD-10-CM | POA: Diagnosis not present

## 2019-08-10 DIAGNOSIS — R262 Difficulty in walking, not elsewhere classified: Secondary | ICD-10-CM | POA: Diagnosis not present

## 2019-08-10 DIAGNOSIS — S8265XS Nondisplaced fracture of lateral malleolus of left fibula, sequela: Secondary | ICD-10-CM | POA: Diagnosis not present

## 2019-08-14 DIAGNOSIS — S8265XS Nondisplaced fracture of lateral malleolus of left fibula, sequela: Secondary | ICD-10-CM | POA: Diagnosis not present

## 2019-08-14 DIAGNOSIS — R296 Repeated falls: Secondary | ICD-10-CM | POA: Diagnosis not present

## 2019-08-14 DIAGNOSIS — M6281 Muscle weakness (generalized): Secondary | ICD-10-CM | POA: Diagnosis not present

## 2019-08-14 DIAGNOSIS — R262 Difficulty in walking, not elsewhere classified: Secondary | ICD-10-CM | POA: Diagnosis not present

## 2019-08-17 DIAGNOSIS — R296 Repeated falls: Secondary | ICD-10-CM | POA: Diagnosis not present

## 2019-08-17 DIAGNOSIS — S8265XS Nondisplaced fracture of lateral malleolus of left fibula, sequela: Secondary | ICD-10-CM | POA: Diagnosis not present

## 2019-08-17 DIAGNOSIS — M6281 Muscle weakness (generalized): Secondary | ICD-10-CM | POA: Diagnosis not present

## 2019-08-17 DIAGNOSIS — R262 Difficulty in walking, not elsewhere classified: Secondary | ICD-10-CM | POA: Diagnosis not present

## 2019-08-21 DIAGNOSIS — M6281 Muscle weakness (generalized): Secondary | ICD-10-CM | POA: Diagnosis not present

## 2019-08-21 DIAGNOSIS — R262 Difficulty in walking, not elsewhere classified: Secondary | ICD-10-CM | POA: Diagnosis not present

## 2019-08-21 DIAGNOSIS — R296 Repeated falls: Secondary | ICD-10-CM | POA: Diagnosis not present

## 2019-08-21 DIAGNOSIS — S8265XS Nondisplaced fracture of lateral malleolus of left fibula, sequela: Secondary | ICD-10-CM | POA: Diagnosis not present

## 2019-08-23 DIAGNOSIS — M6281 Muscle weakness (generalized): Secondary | ICD-10-CM | POA: Diagnosis not present

## 2019-08-23 DIAGNOSIS — R296 Repeated falls: Secondary | ICD-10-CM | POA: Diagnosis not present

## 2019-08-23 DIAGNOSIS — R262 Difficulty in walking, not elsewhere classified: Secondary | ICD-10-CM | POA: Diagnosis not present

## 2019-08-23 DIAGNOSIS — S8265XS Nondisplaced fracture of lateral malleolus of left fibula, sequela: Secondary | ICD-10-CM | POA: Diagnosis not present

## 2019-08-25 DIAGNOSIS — R262 Difficulty in walking, not elsewhere classified: Secondary | ICD-10-CM | POA: Diagnosis not present

## 2019-08-25 DIAGNOSIS — M6281 Muscle weakness (generalized): Secondary | ICD-10-CM | POA: Diagnosis not present

## 2019-08-25 DIAGNOSIS — S8265XS Nondisplaced fracture of lateral malleolus of left fibula, sequela: Secondary | ICD-10-CM | POA: Diagnosis not present

## 2019-08-25 DIAGNOSIS — R296 Repeated falls: Secondary | ICD-10-CM | POA: Diagnosis not present

## 2019-08-28 DIAGNOSIS — R262 Difficulty in walking, not elsewhere classified: Secondary | ICD-10-CM | POA: Diagnosis not present

## 2019-08-28 DIAGNOSIS — M6281 Muscle weakness (generalized): Secondary | ICD-10-CM | POA: Diagnosis not present

## 2019-08-28 DIAGNOSIS — R296 Repeated falls: Secondary | ICD-10-CM | POA: Diagnosis not present

## 2019-08-28 DIAGNOSIS — S8265XS Nondisplaced fracture of lateral malleolus of left fibula, sequela: Secondary | ICD-10-CM | POA: Diagnosis not present

## 2019-08-29 DIAGNOSIS — M6281 Muscle weakness (generalized): Secondary | ICD-10-CM | POA: Diagnosis not present

## 2019-08-29 DIAGNOSIS — R262 Difficulty in walking, not elsewhere classified: Secondary | ICD-10-CM | POA: Diagnosis not present

## 2019-08-29 DIAGNOSIS — R296 Repeated falls: Secondary | ICD-10-CM | POA: Diagnosis not present

## 2019-08-29 DIAGNOSIS — S8265XS Nondisplaced fracture of lateral malleolus of left fibula, sequela: Secondary | ICD-10-CM | POA: Diagnosis not present

## 2019-08-30 DIAGNOSIS — R262 Difficulty in walking, not elsewhere classified: Secondary | ICD-10-CM | POA: Diagnosis not present

## 2019-08-30 DIAGNOSIS — R296 Repeated falls: Secondary | ICD-10-CM | POA: Diagnosis not present

## 2019-08-30 DIAGNOSIS — S8265XS Nondisplaced fracture of lateral malleolus of left fibula, sequela: Secondary | ICD-10-CM | POA: Diagnosis not present

## 2019-08-30 DIAGNOSIS — M6281 Muscle weakness (generalized): Secondary | ICD-10-CM | POA: Diagnosis not present

## 2019-08-31 DIAGNOSIS — R296 Repeated falls: Secondary | ICD-10-CM | POA: Diagnosis not present

## 2019-08-31 DIAGNOSIS — R262 Difficulty in walking, not elsewhere classified: Secondary | ICD-10-CM | POA: Diagnosis not present

## 2019-08-31 DIAGNOSIS — M6281 Muscle weakness (generalized): Secondary | ICD-10-CM | POA: Diagnosis not present

## 2019-08-31 DIAGNOSIS — S8265XS Nondisplaced fracture of lateral malleolus of left fibula, sequela: Secondary | ICD-10-CM | POA: Diagnosis not present

## 2019-09-01 DIAGNOSIS — S8265XS Nondisplaced fracture of lateral malleolus of left fibula, sequela: Secondary | ICD-10-CM | POA: Diagnosis not present

## 2019-09-01 DIAGNOSIS — M6281 Muscle weakness (generalized): Secondary | ICD-10-CM | POA: Diagnosis not present

## 2019-09-01 DIAGNOSIS — R262 Difficulty in walking, not elsewhere classified: Secondary | ICD-10-CM | POA: Diagnosis not present

## 2019-09-01 DIAGNOSIS — R296 Repeated falls: Secondary | ICD-10-CM | POA: Diagnosis not present

## 2019-09-04 DIAGNOSIS — S8265XS Nondisplaced fracture of lateral malleolus of left fibula, sequela: Secondary | ICD-10-CM | POA: Diagnosis not present

## 2019-09-04 DIAGNOSIS — R262 Difficulty in walking, not elsewhere classified: Secondary | ICD-10-CM | POA: Diagnosis not present

## 2019-09-04 DIAGNOSIS — R296 Repeated falls: Secondary | ICD-10-CM | POA: Diagnosis not present

## 2019-09-04 DIAGNOSIS — M6281 Muscle weakness (generalized): Secondary | ICD-10-CM | POA: Diagnosis not present

## 2019-09-06 DIAGNOSIS — M6281 Muscle weakness (generalized): Secondary | ICD-10-CM | POA: Diagnosis not present

## 2019-09-06 DIAGNOSIS — S8265XS Nondisplaced fracture of lateral malleolus of left fibula, sequela: Secondary | ICD-10-CM | POA: Diagnosis not present

## 2019-09-06 DIAGNOSIS — R296 Repeated falls: Secondary | ICD-10-CM | POA: Diagnosis not present

## 2019-09-06 DIAGNOSIS — R262 Difficulty in walking, not elsewhere classified: Secondary | ICD-10-CM | POA: Diagnosis not present

## 2019-09-07 DIAGNOSIS — R262 Difficulty in walking, not elsewhere classified: Secondary | ICD-10-CM | POA: Diagnosis not present

## 2019-09-07 DIAGNOSIS — M6281 Muscle weakness (generalized): Secondary | ICD-10-CM | POA: Diagnosis not present

## 2019-09-07 DIAGNOSIS — R296 Repeated falls: Secondary | ICD-10-CM | POA: Diagnosis not present

## 2019-09-07 DIAGNOSIS — S8265XS Nondisplaced fracture of lateral malleolus of left fibula, sequela: Secondary | ICD-10-CM | POA: Diagnosis not present

## 2019-09-08 DIAGNOSIS — R262 Difficulty in walking, not elsewhere classified: Secondary | ICD-10-CM | POA: Diagnosis not present

## 2019-09-08 DIAGNOSIS — M6281 Muscle weakness (generalized): Secondary | ICD-10-CM | POA: Diagnosis not present

## 2019-09-08 DIAGNOSIS — R296 Repeated falls: Secondary | ICD-10-CM | POA: Diagnosis not present

## 2019-09-08 DIAGNOSIS — S8265XS Nondisplaced fracture of lateral malleolus of left fibula, sequela: Secondary | ICD-10-CM | POA: Diagnosis not present

## 2019-09-13 DIAGNOSIS — M6281 Muscle weakness (generalized): Secondary | ICD-10-CM | POA: Diagnosis not present

## 2019-09-13 DIAGNOSIS — R296 Repeated falls: Secondary | ICD-10-CM | POA: Diagnosis not present

## 2019-09-13 DIAGNOSIS — S8265XS Nondisplaced fracture of lateral malleolus of left fibula, sequela: Secondary | ICD-10-CM | POA: Diagnosis not present

## 2019-09-13 DIAGNOSIS — R262 Difficulty in walking, not elsewhere classified: Secondary | ICD-10-CM | POA: Diagnosis not present

## 2019-09-14 DIAGNOSIS — M6281 Muscle weakness (generalized): Secondary | ICD-10-CM | POA: Diagnosis not present

## 2019-09-14 DIAGNOSIS — R296 Repeated falls: Secondary | ICD-10-CM | POA: Diagnosis not present

## 2019-09-14 DIAGNOSIS — R262 Difficulty in walking, not elsewhere classified: Secondary | ICD-10-CM | POA: Diagnosis not present

## 2019-09-14 DIAGNOSIS — S8265XS Nondisplaced fracture of lateral malleolus of left fibula, sequela: Secondary | ICD-10-CM | POA: Diagnosis not present

## 2019-09-15 DIAGNOSIS — R262 Difficulty in walking, not elsewhere classified: Secondary | ICD-10-CM | POA: Diagnosis not present

## 2019-09-15 DIAGNOSIS — R296 Repeated falls: Secondary | ICD-10-CM | POA: Diagnosis not present

## 2019-09-15 DIAGNOSIS — M6281 Muscle weakness (generalized): Secondary | ICD-10-CM | POA: Diagnosis not present

## 2019-09-15 DIAGNOSIS — S8265XS Nondisplaced fracture of lateral malleolus of left fibula, sequela: Secondary | ICD-10-CM | POA: Diagnosis not present

## 2019-09-18 DIAGNOSIS — S8265XS Nondisplaced fracture of lateral malleolus of left fibula, sequela: Secondary | ICD-10-CM | POA: Diagnosis not present

## 2019-09-18 DIAGNOSIS — R296 Repeated falls: Secondary | ICD-10-CM | POA: Diagnosis not present

## 2019-09-18 DIAGNOSIS — R262 Difficulty in walking, not elsewhere classified: Secondary | ICD-10-CM | POA: Diagnosis not present

## 2019-09-18 DIAGNOSIS — M6281 Muscle weakness (generalized): Secondary | ICD-10-CM | POA: Diagnosis not present

## 2019-09-19 DIAGNOSIS — R262 Difficulty in walking, not elsewhere classified: Secondary | ICD-10-CM | POA: Diagnosis not present

## 2019-09-19 DIAGNOSIS — S8265XS Nondisplaced fracture of lateral malleolus of left fibula, sequela: Secondary | ICD-10-CM | POA: Diagnosis not present

## 2019-09-19 DIAGNOSIS — R296 Repeated falls: Secondary | ICD-10-CM | POA: Diagnosis not present

## 2019-09-19 DIAGNOSIS — M6281 Muscle weakness (generalized): Secondary | ICD-10-CM | POA: Diagnosis not present

## 2019-10-24 DIAGNOSIS — I5032 Chronic diastolic (congestive) heart failure: Secondary | ICD-10-CM | POA: Diagnosis not present

## 2019-10-24 DIAGNOSIS — Z87440 Personal history of urinary (tract) infections: Secondary | ICD-10-CM | POA: Diagnosis not present

## 2019-10-24 DIAGNOSIS — I77810 Thoracic aortic ectasia: Secondary | ICD-10-CM | POA: Diagnosis not present

## 2019-10-24 DIAGNOSIS — I679 Cerebrovascular disease, unspecified: Secondary | ICD-10-CM | POA: Diagnosis not present

## 2019-10-24 DIAGNOSIS — I2510 Atherosclerotic heart disease of native coronary artery without angina pectoris: Secondary | ICD-10-CM | POA: Diagnosis not present

## 2019-10-24 DIAGNOSIS — F419 Anxiety disorder, unspecified: Secondary | ICD-10-CM | POA: Diagnosis not present

## 2019-10-24 DIAGNOSIS — Z8673 Personal history of transient ischemic attack (TIA), and cerebral infarction without residual deficits: Secondary | ICD-10-CM | POA: Diagnosis not present

## 2019-10-24 DIAGNOSIS — E039 Hypothyroidism, unspecified: Secondary | ICD-10-CM | POA: Diagnosis not present

## 2019-10-24 DIAGNOSIS — E440 Moderate protein-calorie malnutrition: Secondary | ICD-10-CM | POA: Diagnosis not present

## 2019-10-24 DIAGNOSIS — F0150 Vascular dementia without behavioral disturbance: Secondary | ICD-10-CM | POA: Diagnosis not present

## 2019-10-26 DIAGNOSIS — F0150 Vascular dementia without behavioral disturbance: Secondary | ICD-10-CM | POA: Diagnosis not present

## 2019-10-26 DIAGNOSIS — I2510 Atherosclerotic heart disease of native coronary artery without angina pectoris: Secondary | ICD-10-CM | POA: Diagnosis not present

## 2019-10-26 DIAGNOSIS — I679 Cerebrovascular disease, unspecified: Secondary | ICD-10-CM | POA: Diagnosis not present

## 2019-10-26 DIAGNOSIS — Z87440 Personal history of urinary (tract) infections: Secondary | ICD-10-CM | POA: Diagnosis not present

## 2019-10-26 DIAGNOSIS — I5032 Chronic diastolic (congestive) heart failure: Secondary | ICD-10-CM | POA: Diagnosis not present

## 2019-10-26 DIAGNOSIS — F419 Anxiety disorder, unspecified: Secondary | ICD-10-CM | POA: Diagnosis not present

## 2019-10-30 DIAGNOSIS — I679 Cerebrovascular disease, unspecified: Secondary | ICD-10-CM | POA: Diagnosis not present

## 2019-10-30 DIAGNOSIS — F419 Anxiety disorder, unspecified: Secondary | ICD-10-CM | POA: Diagnosis not present

## 2019-10-30 DIAGNOSIS — Z87440 Personal history of urinary (tract) infections: Secondary | ICD-10-CM | POA: Diagnosis not present

## 2019-10-30 DIAGNOSIS — I2510 Atherosclerotic heart disease of native coronary artery without angina pectoris: Secondary | ICD-10-CM | POA: Diagnosis not present

## 2019-10-30 DIAGNOSIS — F0150 Vascular dementia without behavioral disturbance: Secondary | ICD-10-CM | POA: Diagnosis not present

## 2019-10-30 DIAGNOSIS — I5032 Chronic diastolic (congestive) heart failure: Secondary | ICD-10-CM | POA: Diagnosis not present

## 2019-11-04 DIAGNOSIS — I2510 Atherosclerotic heart disease of native coronary artery without angina pectoris: Secondary | ICD-10-CM | POA: Diagnosis not present

## 2019-11-04 DIAGNOSIS — I679 Cerebrovascular disease, unspecified: Secondary | ICD-10-CM | POA: Diagnosis not present

## 2019-11-04 DIAGNOSIS — F0150 Vascular dementia without behavioral disturbance: Secondary | ICD-10-CM | POA: Diagnosis not present

## 2019-11-04 DIAGNOSIS — Z87440 Personal history of urinary (tract) infections: Secondary | ICD-10-CM | POA: Diagnosis not present

## 2019-11-04 DIAGNOSIS — I5032 Chronic diastolic (congestive) heart failure: Secondary | ICD-10-CM | POA: Diagnosis not present

## 2019-11-04 DIAGNOSIS — F419 Anxiety disorder, unspecified: Secondary | ICD-10-CM | POA: Diagnosis not present

## 2019-11-06 DIAGNOSIS — Z87440 Personal history of urinary (tract) infections: Secondary | ICD-10-CM | POA: Diagnosis not present

## 2019-11-06 DIAGNOSIS — I5032 Chronic diastolic (congestive) heart failure: Secondary | ICD-10-CM | POA: Diagnosis not present

## 2019-11-06 DIAGNOSIS — I679 Cerebrovascular disease, unspecified: Secondary | ICD-10-CM | POA: Diagnosis not present

## 2019-11-06 DIAGNOSIS — F0150 Vascular dementia without behavioral disturbance: Secondary | ICD-10-CM | POA: Diagnosis not present

## 2019-11-06 DIAGNOSIS — F419 Anxiety disorder, unspecified: Secondary | ICD-10-CM | POA: Diagnosis not present

## 2019-11-06 DIAGNOSIS — I2510 Atherosclerotic heart disease of native coronary artery without angina pectoris: Secondary | ICD-10-CM | POA: Diagnosis not present

## 2019-11-09 DIAGNOSIS — I2510 Atherosclerotic heart disease of native coronary artery without angina pectoris: Secondary | ICD-10-CM | POA: Diagnosis not present

## 2019-11-09 DIAGNOSIS — F419 Anxiety disorder, unspecified: Secondary | ICD-10-CM | POA: Diagnosis not present

## 2019-11-09 DIAGNOSIS — I679 Cerebrovascular disease, unspecified: Secondary | ICD-10-CM | POA: Diagnosis not present

## 2019-11-09 DIAGNOSIS — Z87440 Personal history of urinary (tract) infections: Secondary | ICD-10-CM | POA: Diagnosis not present

## 2019-11-09 DIAGNOSIS — I5032 Chronic diastolic (congestive) heart failure: Secondary | ICD-10-CM | POA: Diagnosis not present

## 2019-11-09 DIAGNOSIS — F0150 Vascular dementia without behavioral disturbance: Secondary | ICD-10-CM | POA: Diagnosis not present

## 2019-11-10 DIAGNOSIS — I5032 Chronic diastolic (congestive) heart failure: Secondary | ICD-10-CM | POA: Diagnosis not present

## 2019-11-10 DIAGNOSIS — I2510 Atherosclerotic heart disease of native coronary artery without angina pectoris: Secondary | ICD-10-CM | POA: Diagnosis not present

## 2019-11-10 DIAGNOSIS — I679 Cerebrovascular disease, unspecified: Secondary | ICD-10-CM | POA: Diagnosis not present

## 2019-11-10 DIAGNOSIS — F0150 Vascular dementia without behavioral disturbance: Secondary | ICD-10-CM | POA: Diagnosis not present

## 2019-11-10 DIAGNOSIS — F419 Anxiety disorder, unspecified: Secondary | ICD-10-CM | POA: Diagnosis not present

## 2019-11-10 DIAGNOSIS — Z87440 Personal history of urinary (tract) infections: Secondary | ICD-10-CM | POA: Diagnosis not present

## 2019-11-13 DIAGNOSIS — I2510 Atherosclerotic heart disease of native coronary artery without angina pectoris: Secondary | ICD-10-CM | POA: Diagnosis not present

## 2019-11-13 DIAGNOSIS — F419 Anxiety disorder, unspecified: Secondary | ICD-10-CM | POA: Diagnosis not present

## 2019-11-13 DIAGNOSIS — I679 Cerebrovascular disease, unspecified: Secondary | ICD-10-CM | POA: Diagnosis not present

## 2019-11-13 DIAGNOSIS — F0150 Vascular dementia without behavioral disturbance: Secondary | ICD-10-CM | POA: Diagnosis not present

## 2019-11-13 DIAGNOSIS — I5032 Chronic diastolic (congestive) heart failure: Secondary | ICD-10-CM | POA: Diagnosis not present

## 2019-11-13 DIAGNOSIS — Z87440 Personal history of urinary (tract) infections: Secondary | ICD-10-CM | POA: Diagnosis not present

## 2019-11-20 DIAGNOSIS — F0150 Vascular dementia without behavioral disturbance: Secondary | ICD-10-CM | POA: Diagnosis not present

## 2019-11-20 DIAGNOSIS — F419 Anxiety disorder, unspecified: Secondary | ICD-10-CM | POA: Diagnosis not present

## 2019-11-20 DIAGNOSIS — Z87440 Personal history of urinary (tract) infections: Secondary | ICD-10-CM | POA: Diagnosis not present

## 2019-11-20 DIAGNOSIS — I5032 Chronic diastolic (congestive) heart failure: Secondary | ICD-10-CM | POA: Diagnosis not present

## 2019-11-20 DIAGNOSIS — I2510 Atherosclerotic heart disease of native coronary artery without angina pectoris: Secondary | ICD-10-CM | POA: Diagnosis not present

## 2019-11-20 DIAGNOSIS — I679 Cerebrovascular disease, unspecified: Secondary | ICD-10-CM | POA: Diagnosis not present

## 2019-11-23 DIAGNOSIS — E039 Hypothyroidism, unspecified: Secondary | ICD-10-CM | POA: Diagnosis not present

## 2019-11-23 DIAGNOSIS — Z8673 Personal history of transient ischemic attack (TIA), and cerebral infarction without residual deficits: Secondary | ICD-10-CM | POA: Diagnosis not present

## 2019-11-23 DIAGNOSIS — Z87440 Personal history of urinary (tract) infections: Secondary | ICD-10-CM | POA: Diagnosis not present

## 2019-11-23 DIAGNOSIS — I2510 Atherosclerotic heart disease of native coronary artery without angina pectoris: Secondary | ICD-10-CM | POA: Diagnosis not present

## 2019-11-23 DIAGNOSIS — I679 Cerebrovascular disease, unspecified: Secondary | ICD-10-CM | POA: Diagnosis not present

## 2019-11-23 DIAGNOSIS — F0150 Vascular dementia without behavioral disturbance: Secondary | ICD-10-CM | POA: Diagnosis not present

## 2019-11-23 DIAGNOSIS — F419 Anxiety disorder, unspecified: Secondary | ICD-10-CM | POA: Diagnosis not present

## 2019-11-23 DIAGNOSIS — E440 Moderate protein-calorie malnutrition: Secondary | ICD-10-CM | POA: Diagnosis not present

## 2019-11-23 DIAGNOSIS — I77810 Thoracic aortic ectasia: Secondary | ICD-10-CM | POA: Diagnosis not present

## 2019-11-23 DIAGNOSIS — I5032 Chronic diastolic (congestive) heart failure: Secondary | ICD-10-CM | POA: Diagnosis not present

## 2019-11-28 DIAGNOSIS — I679 Cerebrovascular disease, unspecified: Secondary | ICD-10-CM | POA: Diagnosis not present

## 2019-11-28 DIAGNOSIS — F0150 Vascular dementia without behavioral disturbance: Secondary | ICD-10-CM | POA: Diagnosis not present

## 2019-11-28 DIAGNOSIS — F419 Anxiety disorder, unspecified: Secondary | ICD-10-CM | POA: Diagnosis not present

## 2019-11-28 DIAGNOSIS — I2510 Atherosclerotic heart disease of native coronary artery without angina pectoris: Secondary | ICD-10-CM | POA: Diagnosis not present

## 2019-11-28 DIAGNOSIS — Z87440 Personal history of urinary (tract) infections: Secondary | ICD-10-CM | POA: Diagnosis not present

## 2019-11-28 DIAGNOSIS — I5032 Chronic diastolic (congestive) heart failure: Secondary | ICD-10-CM | POA: Diagnosis not present

## 2019-11-30 DIAGNOSIS — F419 Anxiety disorder, unspecified: Secondary | ICD-10-CM | POA: Diagnosis not present

## 2019-11-30 DIAGNOSIS — I2510 Atherosclerotic heart disease of native coronary artery without angina pectoris: Secondary | ICD-10-CM | POA: Diagnosis not present

## 2019-11-30 DIAGNOSIS — F0150 Vascular dementia without behavioral disturbance: Secondary | ICD-10-CM | POA: Diagnosis not present

## 2019-11-30 DIAGNOSIS — I679 Cerebrovascular disease, unspecified: Secondary | ICD-10-CM | POA: Diagnosis not present

## 2019-11-30 DIAGNOSIS — I5032 Chronic diastolic (congestive) heart failure: Secondary | ICD-10-CM | POA: Diagnosis not present

## 2019-11-30 DIAGNOSIS — Z87440 Personal history of urinary (tract) infections: Secondary | ICD-10-CM | POA: Diagnosis not present

## 2019-12-04 DIAGNOSIS — F0150 Vascular dementia without behavioral disturbance: Secondary | ICD-10-CM | POA: Diagnosis not present

## 2019-12-04 DIAGNOSIS — I2510 Atherosclerotic heart disease of native coronary artery without angina pectoris: Secondary | ICD-10-CM | POA: Diagnosis not present

## 2019-12-04 DIAGNOSIS — Z87440 Personal history of urinary (tract) infections: Secondary | ICD-10-CM | POA: Diagnosis not present

## 2019-12-04 DIAGNOSIS — F419 Anxiety disorder, unspecified: Secondary | ICD-10-CM | POA: Diagnosis not present

## 2019-12-04 DIAGNOSIS — I679 Cerebrovascular disease, unspecified: Secondary | ICD-10-CM | POA: Diagnosis not present

## 2019-12-04 DIAGNOSIS — I5032 Chronic diastolic (congestive) heart failure: Secondary | ICD-10-CM | POA: Diagnosis not present

## 2019-12-06 DIAGNOSIS — Z87440 Personal history of urinary (tract) infections: Secondary | ICD-10-CM | POA: Diagnosis not present

## 2019-12-06 DIAGNOSIS — F0150 Vascular dementia without behavioral disturbance: Secondary | ICD-10-CM | POA: Diagnosis not present

## 2019-12-06 DIAGNOSIS — I5032 Chronic diastolic (congestive) heart failure: Secondary | ICD-10-CM | POA: Diagnosis not present

## 2019-12-06 DIAGNOSIS — I2510 Atherosclerotic heart disease of native coronary artery without angina pectoris: Secondary | ICD-10-CM | POA: Diagnosis not present

## 2019-12-06 DIAGNOSIS — I679 Cerebrovascular disease, unspecified: Secondary | ICD-10-CM | POA: Diagnosis not present

## 2019-12-06 DIAGNOSIS — F419 Anxiety disorder, unspecified: Secondary | ICD-10-CM | POA: Diagnosis not present

## 2019-12-07 DIAGNOSIS — F0150 Vascular dementia without behavioral disturbance: Secondary | ICD-10-CM | POA: Diagnosis not present

## 2019-12-07 DIAGNOSIS — F419 Anxiety disorder, unspecified: Secondary | ICD-10-CM | POA: Diagnosis not present

## 2019-12-07 DIAGNOSIS — I679 Cerebrovascular disease, unspecified: Secondary | ICD-10-CM | POA: Diagnosis not present

## 2019-12-07 DIAGNOSIS — I5032 Chronic diastolic (congestive) heart failure: Secondary | ICD-10-CM | POA: Diagnosis not present

## 2019-12-07 DIAGNOSIS — Z87440 Personal history of urinary (tract) infections: Secondary | ICD-10-CM | POA: Diagnosis not present

## 2019-12-07 DIAGNOSIS — I2510 Atherosclerotic heart disease of native coronary artery without angina pectoris: Secondary | ICD-10-CM | POA: Diagnosis not present

## 2019-12-08 DIAGNOSIS — F0150 Vascular dementia without behavioral disturbance: Secondary | ICD-10-CM | POA: Diagnosis not present

## 2019-12-08 DIAGNOSIS — I2510 Atherosclerotic heart disease of native coronary artery without angina pectoris: Secondary | ICD-10-CM | POA: Diagnosis not present

## 2019-12-08 DIAGNOSIS — I679 Cerebrovascular disease, unspecified: Secondary | ICD-10-CM | POA: Diagnosis not present

## 2019-12-08 DIAGNOSIS — Z87440 Personal history of urinary (tract) infections: Secondary | ICD-10-CM | POA: Diagnosis not present

## 2019-12-08 DIAGNOSIS — F419 Anxiety disorder, unspecified: Secondary | ICD-10-CM | POA: Diagnosis not present

## 2019-12-08 DIAGNOSIS — I5032 Chronic diastolic (congestive) heart failure: Secondary | ICD-10-CM | POA: Diagnosis not present

## 2019-12-10 DIAGNOSIS — F419 Anxiety disorder, unspecified: Secondary | ICD-10-CM | POA: Diagnosis not present

## 2019-12-10 DIAGNOSIS — F0150 Vascular dementia without behavioral disturbance: Secondary | ICD-10-CM | POA: Diagnosis not present

## 2019-12-10 DIAGNOSIS — I2510 Atherosclerotic heart disease of native coronary artery without angina pectoris: Secondary | ICD-10-CM | POA: Diagnosis not present

## 2019-12-10 DIAGNOSIS — I679 Cerebrovascular disease, unspecified: Secondary | ICD-10-CM | POA: Diagnosis not present

## 2019-12-10 DIAGNOSIS — I5032 Chronic diastolic (congestive) heart failure: Secondary | ICD-10-CM | POA: Diagnosis not present

## 2019-12-10 DIAGNOSIS — Z87440 Personal history of urinary (tract) infections: Secondary | ICD-10-CM | POA: Diagnosis not present

## 2019-12-11 DIAGNOSIS — F419 Anxiety disorder, unspecified: Secondary | ICD-10-CM | POA: Diagnosis not present

## 2019-12-11 DIAGNOSIS — I679 Cerebrovascular disease, unspecified: Secondary | ICD-10-CM | POA: Diagnosis not present

## 2019-12-11 DIAGNOSIS — I5032 Chronic diastolic (congestive) heart failure: Secondary | ICD-10-CM | POA: Diagnosis not present

## 2019-12-11 DIAGNOSIS — I2510 Atherosclerotic heart disease of native coronary artery without angina pectoris: Secondary | ICD-10-CM | POA: Diagnosis not present

## 2019-12-11 DIAGNOSIS — F0150 Vascular dementia without behavioral disturbance: Secondary | ICD-10-CM | POA: Diagnosis not present

## 2019-12-11 DIAGNOSIS — Z87440 Personal history of urinary (tract) infections: Secondary | ICD-10-CM | POA: Diagnosis not present

## 2019-12-13 DIAGNOSIS — F0150 Vascular dementia without behavioral disturbance: Secondary | ICD-10-CM | POA: Diagnosis not present

## 2019-12-13 DIAGNOSIS — I679 Cerebrovascular disease, unspecified: Secondary | ICD-10-CM | POA: Diagnosis not present

## 2019-12-13 DIAGNOSIS — I5032 Chronic diastolic (congestive) heart failure: Secondary | ICD-10-CM | POA: Diagnosis not present

## 2019-12-13 DIAGNOSIS — F419 Anxiety disorder, unspecified: Secondary | ICD-10-CM | POA: Diagnosis not present

## 2019-12-13 DIAGNOSIS — Z87440 Personal history of urinary (tract) infections: Secondary | ICD-10-CM | POA: Diagnosis not present

## 2019-12-13 DIAGNOSIS — I2510 Atherosclerotic heart disease of native coronary artery without angina pectoris: Secondary | ICD-10-CM | POA: Diagnosis not present

## 2019-12-14 DIAGNOSIS — F0150 Vascular dementia without behavioral disturbance: Secondary | ICD-10-CM | POA: Diagnosis not present

## 2019-12-14 DIAGNOSIS — I679 Cerebrovascular disease, unspecified: Secondary | ICD-10-CM | POA: Diagnosis not present

## 2019-12-14 DIAGNOSIS — Z87440 Personal history of urinary (tract) infections: Secondary | ICD-10-CM | POA: Diagnosis not present

## 2019-12-14 DIAGNOSIS — F419 Anxiety disorder, unspecified: Secondary | ICD-10-CM | POA: Diagnosis not present

## 2019-12-14 DIAGNOSIS — I2510 Atherosclerotic heart disease of native coronary artery without angina pectoris: Secondary | ICD-10-CM | POA: Diagnosis not present

## 2019-12-14 DIAGNOSIS — I5032 Chronic diastolic (congestive) heart failure: Secondary | ICD-10-CM | POA: Diagnosis not present

## 2019-12-18 DIAGNOSIS — F0150 Vascular dementia without behavioral disturbance: Secondary | ICD-10-CM | POA: Diagnosis not present

## 2019-12-18 DIAGNOSIS — I5032 Chronic diastolic (congestive) heart failure: Secondary | ICD-10-CM | POA: Diagnosis not present

## 2019-12-18 DIAGNOSIS — I679 Cerebrovascular disease, unspecified: Secondary | ICD-10-CM | POA: Diagnosis not present

## 2019-12-18 DIAGNOSIS — I2510 Atherosclerotic heart disease of native coronary artery without angina pectoris: Secondary | ICD-10-CM | POA: Diagnosis not present

## 2019-12-18 DIAGNOSIS — F419 Anxiety disorder, unspecified: Secondary | ICD-10-CM | POA: Diagnosis not present

## 2019-12-18 DIAGNOSIS — Z87440 Personal history of urinary (tract) infections: Secondary | ICD-10-CM | POA: Diagnosis not present

## 2019-12-21 DIAGNOSIS — I2510 Atherosclerotic heart disease of native coronary artery without angina pectoris: Secondary | ICD-10-CM | POA: Diagnosis not present

## 2019-12-21 DIAGNOSIS — I5032 Chronic diastolic (congestive) heart failure: Secondary | ICD-10-CM | POA: Diagnosis not present

## 2019-12-21 DIAGNOSIS — I679 Cerebrovascular disease, unspecified: Secondary | ICD-10-CM | POA: Diagnosis not present

## 2019-12-21 DIAGNOSIS — F0150 Vascular dementia without behavioral disturbance: Secondary | ICD-10-CM | POA: Diagnosis not present

## 2019-12-21 DIAGNOSIS — Z87440 Personal history of urinary (tract) infections: Secondary | ICD-10-CM | POA: Diagnosis not present

## 2019-12-21 DIAGNOSIS — F419 Anxiety disorder, unspecified: Secondary | ICD-10-CM | POA: Diagnosis not present

## 2019-12-24 DIAGNOSIS — I2510 Atherosclerotic heart disease of native coronary artery without angina pectoris: Secondary | ICD-10-CM | POA: Diagnosis not present

## 2019-12-24 DIAGNOSIS — F419 Anxiety disorder, unspecified: Secondary | ICD-10-CM | POA: Diagnosis not present

## 2019-12-24 DIAGNOSIS — E440 Moderate protein-calorie malnutrition: Secondary | ICD-10-CM | POA: Diagnosis not present

## 2019-12-24 DIAGNOSIS — E039 Hypothyroidism, unspecified: Secondary | ICD-10-CM | POA: Diagnosis not present

## 2019-12-24 DIAGNOSIS — I5032 Chronic diastolic (congestive) heart failure: Secondary | ICD-10-CM | POA: Diagnosis not present

## 2019-12-24 DIAGNOSIS — F0150 Vascular dementia without behavioral disturbance: Secondary | ICD-10-CM | POA: Diagnosis not present

## 2019-12-24 DIAGNOSIS — I679 Cerebrovascular disease, unspecified: Secondary | ICD-10-CM | POA: Diagnosis not present

## 2019-12-24 DIAGNOSIS — I77810 Thoracic aortic ectasia: Secondary | ICD-10-CM | POA: Diagnosis not present

## 2019-12-24 DIAGNOSIS — Z8673 Personal history of transient ischemic attack (TIA), and cerebral infarction without residual deficits: Secondary | ICD-10-CM | POA: Diagnosis not present

## 2019-12-24 DIAGNOSIS — Z87440 Personal history of urinary (tract) infections: Secondary | ICD-10-CM | POA: Diagnosis not present

## 2019-12-25 DIAGNOSIS — I5032 Chronic diastolic (congestive) heart failure: Secondary | ICD-10-CM | POA: Diagnosis not present

## 2019-12-25 DIAGNOSIS — I679 Cerebrovascular disease, unspecified: Secondary | ICD-10-CM | POA: Diagnosis not present

## 2019-12-25 DIAGNOSIS — I2510 Atherosclerotic heart disease of native coronary artery without angina pectoris: Secondary | ICD-10-CM | POA: Diagnosis not present

## 2019-12-25 DIAGNOSIS — F419 Anxiety disorder, unspecified: Secondary | ICD-10-CM | POA: Diagnosis not present

## 2019-12-25 DIAGNOSIS — F0150 Vascular dementia without behavioral disturbance: Secondary | ICD-10-CM | POA: Diagnosis not present

## 2019-12-25 DIAGNOSIS — Z87440 Personal history of urinary (tract) infections: Secondary | ICD-10-CM | POA: Diagnosis not present

## 2019-12-26 DIAGNOSIS — F0150 Vascular dementia without behavioral disturbance: Secondary | ICD-10-CM | POA: Diagnosis not present

## 2019-12-26 DIAGNOSIS — I2510 Atherosclerotic heart disease of native coronary artery without angina pectoris: Secondary | ICD-10-CM | POA: Diagnosis not present

## 2019-12-26 DIAGNOSIS — I679 Cerebrovascular disease, unspecified: Secondary | ICD-10-CM | POA: Diagnosis not present

## 2019-12-26 DIAGNOSIS — F419 Anxiety disorder, unspecified: Secondary | ICD-10-CM | POA: Diagnosis not present

## 2019-12-26 DIAGNOSIS — I5032 Chronic diastolic (congestive) heart failure: Secondary | ICD-10-CM | POA: Diagnosis not present

## 2019-12-26 DIAGNOSIS — Z87440 Personal history of urinary (tract) infections: Secondary | ICD-10-CM | POA: Diagnosis not present

## 2019-12-28 DIAGNOSIS — I679 Cerebrovascular disease, unspecified: Secondary | ICD-10-CM | POA: Diagnosis not present

## 2019-12-28 DIAGNOSIS — F419 Anxiety disorder, unspecified: Secondary | ICD-10-CM | POA: Diagnosis not present

## 2019-12-28 DIAGNOSIS — I2510 Atherosclerotic heart disease of native coronary artery without angina pectoris: Secondary | ICD-10-CM | POA: Diagnosis not present

## 2019-12-28 DIAGNOSIS — F0150 Vascular dementia without behavioral disturbance: Secondary | ICD-10-CM | POA: Diagnosis not present

## 2019-12-28 DIAGNOSIS — Z87440 Personal history of urinary (tract) infections: Secondary | ICD-10-CM | POA: Diagnosis not present

## 2019-12-28 DIAGNOSIS — I5032 Chronic diastolic (congestive) heart failure: Secondary | ICD-10-CM | POA: Diagnosis not present

## 2020-01-01 DIAGNOSIS — I5032 Chronic diastolic (congestive) heart failure: Secondary | ICD-10-CM | POA: Diagnosis not present

## 2020-01-01 DIAGNOSIS — I2510 Atherosclerotic heart disease of native coronary artery without angina pectoris: Secondary | ICD-10-CM | POA: Diagnosis not present

## 2020-01-01 DIAGNOSIS — Z87440 Personal history of urinary (tract) infections: Secondary | ICD-10-CM | POA: Diagnosis not present

## 2020-01-01 DIAGNOSIS — F419 Anxiety disorder, unspecified: Secondary | ICD-10-CM | POA: Diagnosis not present

## 2020-01-01 DIAGNOSIS — F0150 Vascular dementia without behavioral disturbance: Secondary | ICD-10-CM | POA: Diagnosis not present

## 2020-01-01 DIAGNOSIS — I679 Cerebrovascular disease, unspecified: Secondary | ICD-10-CM | POA: Diagnosis not present

## 2020-01-04 DIAGNOSIS — I2510 Atherosclerotic heart disease of native coronary artery without angina pectoris: Secondary | ICD-10-CM | POA: Diagnosis not present

## 2020-01-04 DIAGNOSIS — I679 Cerebrovascular disease, unspecified: Secondary | ICD-10-CM | POA: Diagnosis not present

## 2020-01-04 DIAGNOSIS — F0150 Vascular dementia without behavioral disturbance: Secondary | ICD-10-CM | POA: Diagnosis not present

## 2020-01-04 DIAGNOSIS — F419 Anxiety disorder, unspecified: Secondary | ICD-10-CM | POA: Diagnosis not present

## 2020-01-04 DIAGNOSIS — I5032 Chronic diastolic (congestive) heart failure: Secondary | ICD-10-CM | POA: Diagnosis not present

## 2020-01-04 DIAGNOSIS — Z87440 Personal history of urinary (tract) infections: Secondary | ICD-10-CM | POA: Diagnosis not present

## 2020-01-08 DIAGNOSIS — Z87440 Personal history of urinary (tract) infections: Secondary | ICD-10-CM | POA: Diagnosis not present

## 2020-01-08 DIAGNOSIS — F0150 Vascular dementia without behavioral disturbance: Secondary | ICD-10-CM | POA: Diagnosis not present

## 2020-01-08 DIAGNOSIS — F419 Anxiety disorder, unspecified: Secondary | ICD-10-CM | POA: Diagnosis not present

## 2020-01-08 DIAGNOSIS — I679 Cerebrovascular disease, unspecified: Secondary | ICD-10-CM | POA: Diagnosis not present

## 2020-01-08 DIAGNOSIS — I2510 Atherosclerotic heart disease of native coronary artery without angina pectoris: Secondary | ICD-10-CM | POA: Diagnosis not present

## 2020-01-08 DIAGNOSIS — I5032 Chronic diastolic (congestive) heart failure: Secondary | ICD-10-CM | POA: Diagnosis not present

## 2020-01-11 DIAGNOSIS — F419 Anxiety disorder, unspecified: Secondary | ICD-10-CM | POA: Diagnosis not present

## 2020-01-11 DIAGNOSIS — I2510 Atherosclerotic heart disease of native coronary artery without angina pectoris: Secondary | ICD-10-CM | POA: Diagnosis not present

## 2020-01-11 DIAGNOSIS — I5032 Chronic diastolic (congestive) heart failure: Secondary | ICD-10-CM | POA: Diagnosis not present

## 2020-01-11 DIAGNOSIS — Z87440 Personal history of urinary (tract) infections: Secondary | ICD-10-CM | POA: Diagnosis not present

## 2020-01-11 DIAGNOSIS — I679 Cerebrovascular disease, unspecified: Secondary | ICD-10-CM | POA: Diagnosis not present

## 2020-01-11 DIAGNOSIS — F0150 Vascular dementia without behavioral disturbance: Secondary | ICD-10-CM | POA: Diagnosis not present

## 2020-01-15 DIAGNOSIS — Z87440 Personal history of urinary (tract) infections: Secondary | ICD-10-CM | POA: Diagnosis not present

## 2020-01-15 DIAGNOSIS — I5032 Chronic diastolic (congestive) heart failure: Secondary | ICD-10-CM | POA: Diagnosis not present

## 2020-01-15 DIAGNOSIS — F419 Anxiety disorder, unspecified: Secondary | ICD-10-CM | POA: Diagnosis not present

## 2020-01-15 DIAGNOSIS — F0150 Vascular dementia without behavioral disturbance: Secondary | ICD-10-CM | POA: Diagnosis not present

## 2020-01-15 DIAGNOSIS — I679 Cerebrovascular disease, unspecified: Secondary | ICD-10-CM | POA: Diagnosis not present

## 2020-01-15 DIAGNOSIS — I2510 Atherosclerotic heart disease of native coronary artery without angina pectoris: Secondary | ICD-10-CM | POA: Diagnosis not present

## 2020-01-17 DIAGNOSIS — I679 Cerebrovascular disease, unspecified: Secondary | ICD-10-CM | POA: Diagnosis not present

## 2020-01-17 DIAGNOSIS — F419 Anxiety disorder, unspecified: Secondary | ICD-10-CM | POA: Diagnosis not present

## 2020-01-17 DIAGNOSIS — F0150 Vascular dementia without behavioral disturbance: Secondary | ICD-10-CM | POA: Diagnosis not present

## 2020-01-17 DIAGNOSIS — Z87440 Personal history of urinary (tract) infections: Secondary | ICD-10-CM | POA: Diagnosis not present

## 2020-01-17 DIAGNOSIS — I2510 Atherosclerotic heart disease of native coronary artery without angina pectoris: Secondary | ICD-10-CM | POA: Diagnosis not present

## 2020-01-17 DIAGNOSIS — I5032 Chronic diastolic (congestive) heart failure: Secondary | ICD-10-CM | POA: Diagnosis not present

## 2020-01-18 DIAGNOSIS — I2510 Atherosclerotic heart disease of native coronary artery without angina pectoris: Secondary | ICD-10-CM | POA: Diagnosis not present

## 2020-01-18 DIAGNOSIS — F419 Anxiety disorder, unspecified: Secondary | ICD-10-CM | POA: Diagnosis not present

## 2020-01-18 DIAGNOSIS — I5032 Chronic diastolic (congestive) heart failure: Secondary | ICD-10-CM | POA: Diagnosis not present

## 2020-01-18 DIAGNOSIS — Z87440 Personal history of urinary (tract) infections: Secondary | ICD-10-CM | POA: Diagnosis not present

## 2020-01-18 DIAGNOSIS — F0150 Vascular dementia without behavioral disturbance: Secondary | ICD-10-CM | POA: Diagnosis not present

## 2020-01-18 DIAGNOSIS — I679 Cerebrovascular disease, unspecified: Secondary | ICD-10-CM | POA: Diagnosis not present

## 2020-01-19 DIAGNOSIS — I679 Cerebrovascular disease, unspecified: Secondary | ICD-10-CM | POA: Diagnosis not present

## 2020-01-19 DIAGNOSIS — Z87440 Personal history of urinary (tract) infections: Secondary | ICD-10-CM | POA: Diagnosis not present

## 2020-01-19 DIAGNOSIS — I5032 Chronic diastolic (congestive) heart failure: Secondary | ICD-10-CM | POA: Diagnosis not present

## 2020-01-19 DIAGNOSIS — I2510 Atherosclerotic heart disease of native coronary artery without angina pectoris: Secondary | ICD-10-CM | POA: Diagnosis not present

## 2020-01-19 DIAGNOSIS — F419 Anxiety disorder, unspecified: Secondary | ICD-10-CM | POA: Diagnosis not present

## 2020-01-19 DIAGNOSIS — F0150 Vascular dementia without behavioral disturbance: Secondary | ICD-10-CM | POA: Diagnosis not present

## 2020-01-22 DIAGNOSIS — F419 Anxiety disorder, unspecified: Secondary | ICD-10-CM | POA: Diagnosis not present

## 2020-01-22 DIAGNOSIS — Z87440 Personal history of urinary (tract) infections: Secondary | ICD-10-CM | POA: Diagnosis not present

## 2020-01-22 DIAGNOSIS — I679 Cerebrovascular disease, unspecified: Secondary | ICD-10-CM | POA: Diagnosis not present

## 2020-01-22 DIAGNOSIS — F0150 Vascular dementia without behavioral disturbance: Secondary | ICD-10-CM | POA: Diagnosis not present

## 2020-01-22 DIAGNOSIS — I5032 Chronic diastolic (congestive) heart failure: Secondary | ICD-10-CM | POA: Diagnosis not present

## 2020-01-22 DIAGNOSIS — I2510 Atherosclerotic heart disease of native coronary artery without angina pectoris: Secondary | ICD-10-CM | POA: Diagnosis not present

## 2020-01-24 DIAGNOSIS — I77810 Thoracic aortic ectasia: Secondary | ICD-10-CM | POA: Diagnosis not present

## 2020-01-24 DIAGNOSIS — F0150 Vascular dementia without behavioral disturbance: Secondary | ICD-10-CM | POA: Diagnosis not present

## 2020-01-24 DIAGNOSIS — I5032 Chronic diastolic (congestive) heart failure: Secondary | ICD-10-CM | POA: Diagnosis not present

## 2020-01-24 DIAGNOSIS — E440 Moderate protein-calorie malnutrition: Secondary | ICD-10-CM | POA: Diagnosis not present

## 2020-01-24 DIAGNOSIS — Z8673 Personal history of transient ischemic attack (TIA), and cerebral infarction without residual deficits: Secondary | ICD-10-CM | POA: Diagnosis not present

## 2020-01-24 DIAGNOSIS — I679 Cerebrovascular disease, unspecified: Secondary | ICD-10-CM | POA: Diagnosis not present

## 2020-01-24 DIAGNOSIS — I2510 Atherosclerotic heart disease of native coronary artery without angina pectoris: Secondary | ICD-10-CM | POA: Diagnosis not present

## 2020-01-24 DIAGNOSIS — Z87440 Personal history of urinary (tract) infections: Secondary | ICD-10-CM | POA: Diagnosis not present

## 2020-01-24 DIAGNOSIS — F419 Anxiety disorder, unspecified: Secondary | ICD-10-CM | POA: Diagnosis not present

## 2020-01-24 DIAGNOSIS — E039 Hypothyroidism, unspecified: Secondary | ICD-10-CM | POA: Diagnosis not present

## 2020-01-25 DIAGNOSIS — I679 Cerebrovascular disease, unspecified: Secondary | ICD-10-CM | POA: Diagnosis not present

## 2020-01-25 DIAGNOSIS — I5032 Chronic diastolic (congestive) heart failure: Secondary | ICD-10-CM | POA: Diagnosis not present

## 2020-01-25 DIAGNOSIS — Z87440 Personal history of urinary (tract) infections: Secondary | ICD-10-CM | POA: Diagnosis not present

## 2020-01-25 DIAGNOSIS — I2510 Atherosclerotic heart disease of native coronary artery without angina pectoris: Secondary | ICD-10-CM | POA: Diagnosis not present

## 2020-01-25 DIAGNOSIS — F0150 Vascular dementia without behavioral disturbance: Secondary | ICD-10-CM | POA: Diagnosis not present

## 2020-01-25 DIAGNOSIS — F419 Anxiety disorder, unspecified: Secondary | ICD-10-CM | POA: Diagnosis not present

## 2020-01-30 DIAGNOSIS — Z87440 Personal history of urinary (tract) infections: Secondary | ICD-10-CM | POA: Diagnosis not present

## 2020-01-30 DIAGNOSIS — F0150 Vascular dementia without behavioral disturbance: Secondary | ICD-10-CM | POA: Diagnosis not present

## 2020-01-30 DIAGNOSIS — I2510 Atherosclerotic heart disease of native coronary artery without angina pectoris: Secondary | ICD-10-CM | POA: Diagnosis not present

## 2020-01-30 DIAGNOSIS — F419 Anxiety disorder, unspecified: Secondary | ICD-10-CM | POA: Diagnosis not present

## 2020-01-30 DIAGNOSIS — I5032 Chronic diastolic (congestive) heart failure: Secondary | ICD-10-CM | POA: Diagnosis not present

## 2020-01-30 DIAGNOSIS — I679 Cerebrovascular disease, unspecified: Secondary | ICD-10-CM | POA: Diagnosis not present

## 2020-01-31 DIAGNOSIS — I679 Cerebrovascular disease, unspecified: Secondary | ICD-10-CM | POA: Diagnosis not present

## 2020-01-31 DIAGNOSIS — F419 Anxiety disorder, unspecified: Secondary | ICD-10-CM | POA: Diagnosis not present

## 2020-01-31 DIAGNOSIS — I5032 Chronic diastolic (congestive) heart failure: Secondary | ICD-10-CM | POA: Diagnosis not present

## 2020-01-31 DIAGNOSIS — I2510 Atherosclerotic heart disease of native coronary artery without angina pectoris: Secondary | ICD-10-CM | POA: Diagnosis not present

## 2020-01-31 DIAGNOSIS — Z87440 Personal history of urinary (tract) infections: Secondary | ICD-10-CM | POA: Diagnosis not present

## 2020-01-31 DIAGNOSIS — F0150 Vascular dementia without behavioral disturbance: Secondary | ICD-10-CM | POA: Diagnosis not present

## 2020-02-01 DIAGNOSIS — I5032 Chronic diastolic (congestive) heart failure: Secondary | ICD-10-CM | POA: Diagnosis not present

## 2020-02-01 DIAGNOSIS — Z87440 Personal history of urinary (tract) infections: Secondary | ICD-10-CM | POA: Diagnosis not present

## 2020-02-01 DIAGNOSIS — F419 Anxiety disorder, unspecified: Secondary | ICD-10-CM | POA: Diagnosis not present

## 2020-02-01 DIAGNOSIS — I679 Cerebrovascular disease, unspecified: Secondary | ICD-10-CM | POA: Diagnosis not present

## 2020-02-01 DIAGNOSIS — I2510 Atherosclerotic heart disease of native coronary artery without angina pectoris: Secondary | ICD-10-CM | POA: Diagnosis not present

## 2020-02-01 DIAGNOSIS — F0150 Vascular dementia without behavioral disturbance: Secondary | ICD-10-CM | POA: Diagnosis not present

## 2020-02-05 DIAGNOSIS — F0150 Vascular dementia without behavioral disturbance: Secondary | ICD-10-CM | POA: Diagnosis not present

## 2020-02-05 DIAGNOSIS — F419 Anxiety disorder, unspecified: Secondary | ICD-10-CM | POA: Diagnosis not present

## 2020-02-05 DIAGNOSIS — Z87440 Personal history of urinary (tract) infections: Secondary | ICD-10-CM | POA: Diagnosis not present

## 2020-02-05 DIAGNOSIS — I5032 Chronic diastolic (congestive) heart failure: Secondary | ICD-10-CM | POA: Diagnosis not present

## 2020-02-05 DIAGNOSIS — I2510 Atherosclerotic heart disease of native coronary artery without angina pectoris: Secondary | ICD-10-CM | POA: Diagnosis not present

## 2020-02-05 DIAGNOSIS — I679 Cerebrovascular disease, unspecified: Secondary | ICD-10-CM | POA: Diagnosis not present

## 2020-02-06 DIAGNOSIS — I2510 Atherosclerotic heart disease of native coronary artery without angina pectoris: Secondary | ICD-10-CM | POA: Diagnosis not present

## 2020-02-06 DIAGNOSIS — F419 Anxiety disorder, unspecified: Secondary | ICD-10-CM | POA: Diagnosis not present

## 2020-02-06 DIAGNOSIS — I5032 Chronic diastolic (congestive) heart failure: Secondary | ICD-10-CM | POA: Diagnosis not present

## 2020-02-06 DIAGNOSIS — I679 Cerebrovascular disease, unspecified: Secondary | ICD-10-CM | POA: Diagnosis not present

## 2020-02-06 DIAGNOSIS — F0150 Vascular dementia without behavioral disturbance: Secondary | ICD-10-CM | POA: Diagnosis not present

## 2020-02-06 DIAGNOSIS — Z87440 Personal history of urinary (tract) infections: Secondary | ICD-10-CM | POA: Diagnosis not present

## 2020-02-08 DIAGNOSIS — F0150 Vascular dementia without behavioral disturbance: Secondary | ICD-10-CM | POA: Diagnosis not present

## 2020-02-08 DIAGNOSIS — I2510 Atherosclerotic heart disease of native coronary artery without angina pectoris: Secondary | ICD-10-CM | POA: Diagnosis not present

## 2020-02-08 DIAGNOSIS — F419 Anxiety disorder, unspecified: Secondary | ICD-10-CM | POA: Diagnosis not present

## 2020-02-08 DIAGNOSIS — I5032 Chronic diastolic (congestive) heart failure: Secondary | ICD-10-CM | POA: Diagnosis not present

## 2020-02-08 DIAGNOSIS — I679 Cerebrovascular disease, unspecified: Secondary | ICD-10-CM | POA: Diagnosis not present

## 2020-02-08 DIAGNOSIS — Z87440 Personal history of urinary (tract) infections: Secondary | ICD-10-CM | POA: Diagnosis not present

## 2020-02-09 DIAGNOSIS — I679 Cerebrovascular disease, unspecified: Secondary | ICD-10-CM | POA: Diagnosis not present

## 2020-02-09 DIAGNOSIS — I2510 Atherosclerotic heart disease of native coronary artery without angina pectoris: Secondary | ICD-10-CM | POA: Diagnosis not present

## 2020-02-09 DIAGNOSIS — I5032 Chronic diastolic (congestive) heart failure: Secondary | ICD-10-CM | POA: Diagnosis not present

## 2020-02-09 DIAGNOSIS — Z87440 Personal history of urinary (tract) infections: Secondary | ICD-10-CM | POA: Diagnosis not present

## 2020-02-09 DIAGNOSIS — F0150 Vascular dementia without behavioral disturbance: Secondary | ICD-10-CM | POA: Diagnosis not present

## 2020-02-09 DIAGNOSIS — F419 Anxiety disorder, unspecified: Secondary | ICD-10-CM | POA: Diagnosis not present

## 2020-02-12 DIAGNOSIS — F0150 Vascular dementia without behavioral disturbance: Secondary | ICD-10-CM | POA: Diagnosis not present

## 2020-02-12 DIAGNOSIS — Z87440 Personal history of urinary (tract) infections: Secondary | ICD-10-CM | POA: Diagnosis not present

## 2020-02-12 DIAGNOSIS — I679 Cerebrovascular disease, unspecified: Secondary | ICD-10-CM | POA: Diagnosis not present

## 2020-02-12 DIAGNOSIS — I2510 Atherosclerotic heart disease of native coronary artery without angina pectoris: Secondary | ICD-10-CM | POA: Diagnosis not present

## 2020-02-12 DIAGNOSIS — F419 Anxiety disorder, unspecified: Secondary | ICD-10-CM | POA: Diagnosis not present

## 2020-02-12 DIAGNOSIS — I5032 Chronic diastolic (congestive) heart failure: Secondary | ICD-10-CM | POA: Diagnosis not present

## 2020-02-15 DIAGNOSIS — F419 Anxiety disorder, unspecified: Secondary | ICD-10-CM | POA: Diagnosis not present

## 2020-02-15 DIAGNOSIS — F0150 Vascular dementia without behavioral disturbance: Secondary | ICD-10-CM | POA: Diagnosis not present

## 2020-02-15 DIAGNOSIS — I2510 Atherosclerotic heart disease of native coronary artery without angina pectoris: Secondary | ICD-10-CM | POA: Diagnosis not present

## 2020-02-15 DIAGNOSIS — Z87440 Personal history of urinary (tract) infections: Secondary | ICD-10-CM | POA: Diagnosis not present

## 2020-02-15 DIAGNOSIS — I679 Cerebrovascular disease, unspecified: Secondary | ICD-10-CM | POA: Diagnosis not present

## 2020-02-15 DIAGNOSIS — I5032 Chronic diastolic (congestive) heart failure: Secondary | ICD-10-CM | POA: Diagnosis not present

## 2020-02-19 DIAGNOSIS — I679 Cerebrovascular disease, unspecified: Secondary | ICD-10-CM | POA: Diagnosis not present

## 2020-02-19 DIAGNOSIS — I5032 Chronic diastolic (congestive) heart failure: Secondary | ICD-10-CM | POA: Diagnosis not present

## 2020-02-19 DIAGNOSIS — F419 Anxiety disorder, unspecified: Secondary | ICD-10-CM | POA: Diagnosis not present

## 2020-02-19 DIAGNOSIS — I2510 Atherosclerotic heart disease of native coronary artery without angina pectoris: Secondary | ICD-10-CM | POA: Diagnosis not present

## 2020-02-19 DIAGNOSIS — Z87440 Personal history of urinary (tract) infections: Secondary | ICD-10-CM | POA: Diagnosis not present

## 2020-02-19 DIAGNOSIS — F0150 Vascular dementia without behavioral disturbance: Secondary | ICD-10-CM | POA: Diagnosis not present

## 2020-02-22 DIAGNOSIS — F419 Anxiety disorder, unspecified: Secondary | ICD-10-CM | POA: Diagnosis not present

## 2020-02-22 DIAGNOSIS — I679 Cerebrovascular disease, unspecified: Secondary | ICD-10-CM | POA: Diagnosis not present

## 2020-02-22 DIAGNOSIS — Z87440 Personal history of urinary (tract) infections: Secondary | ICD-10-CM | POA: Diagnosis not present

## 2020-02-22 DIAGNOSIS — I2510 Atherosclerotic heart disease of native coronary artery without angina pectoris: Secondary | ICD-10-CM | POA: Diagnosis not present

## 2020-02-22 DIAGNOSIS — F0150 Vascular dementia without behavioral disturbance: Secondary | ICD-10-CM | POA: Diagnosis not present

## 2020-02-22 DIAGNOSIS — I5032 Chronic diastolic (congestive) heart failure: Secondary | ICD-10-CM | POA: Diagnosis not present

## 2020-02-23 DIAGNOSIS — E440 Moderate protein-calorie malnutrition: Secondary | ICD-10-CM | POA: Diagnosis not present

## 2020-02-23 DIAGNOSIS — I77810 Thoracic aortic ectasia: Secondary | ICD-10-CM | POA: Diagnosis not present

## 2020-02-23 DIAGNOSIS — I2510 Atherosclerotic heart disease of native coronary artery without angina pectoris: Secondary | ICD-10-CM | POA: Diagnosis not present

## 2020-02-23 DIAGNOSIS — I679 Cerebrovascular disease, unspecified: Secondary | ICD-10-CM | POA: Diagnosis not present

## 2020-02-23 DIAGNOSIS — F419 Anxiety disorder, unspecified: Secondary | ICD-10-CM | POA: Diagnosis not present

## 2020-02-23 DIAGNOSIS — I5032 Chronic diastolic (congestive) heart failure: Secondary | ICD-10-CM | POA: Diagnosis not present

## 2020-02-23 DIAGNOSIS — Z87440 Personal history of urinary (tract) infections: Secondary | ICD-10-CM | POA: Diagnosis not present

## 2020-02-23 DIAGNOSIS — F0150 Vascular dementia without behavioral disturbance: Secondary | ICD-10-CM | POA: Diagnosis not present

## 2020-02-23 DIAGNOSIS — E039 Hypothyroidism, unspecified: Secondary | ICD-10-CM | POA: Diagnosis not present

## 2020-02-23 DIAGNOSIS — Z8673 Personal history of transient ischemic attack (TIA), and cerebral infarction without residual deficits: Secondary | ICD-10-CM | POA: Diagnosis not present

## 2020-02-26 DIAGNOSIS — Z87440 Personal history of urinary (tract) infections: Secondary | ICD-10-CM | POA: Diagnosis not present

## 2020-02-26 DIAGNOSIS — F419 Anxiety disorder, unspecified: Secondary | ICD-10-CM | POA: Diagnosis not present

## 2020-02-26 DIAGNOSIS — I679 Cerebrovascular disease, unspecified: Secondary | ICD-10-CM | POA: Diagnosis not present

## 2020-02-26 DIAGNOSIS — I5032 Chronic diastolic (congestive) heart failure: Secondary | ICD-10-CM | POA: Diagnosis not present

## 2020-02-26 DIAGNOSIS — I2510 Atherosclerotic heart disease of native coronary artery without angina pectoris: Secondary | ICD-10-CM | POA: Diagnosis not present

## 2020-02-26 DIAGNOSIS — F0150 Vascular dementia without behavioral disturbance: Secondary | ICD-10-CM | POA: Diagnosis not present

## 2020-02-29 DIAGNOSIS — I2510 Atherosclerotic heart disease of native coronary artery without angina pectoris: Secondary | ICD-10-CM | POA: Diagnosis not present

## 2020-02-29 DIAGNOSIS — Z87440 Personal history of urinary (tract) infections: Secondary | ICD-10-CM | POA: Diagnosis not present

## 2020-02-29 DIAGNOSIS — I679 Cerebrovascular disease, unspecified: Secondary | ICD-10-CM | POA: Diagnosis not present

## 2020-02-29 DIAGNOSIS — F419 Anxiety disorder, unspecified: Secondary | ICD-10-CM | POA: Diagnosis not present

## 2020-02-29 DIAGNOSIS — I5032 Chronic diastolic (congestive) heart failure: Secondary | ICD-10-CM | POA: Diagnosis not present

## 2020-02-29 DIAGNOSIS — F0150 Vascular dementia without behavioral disturbance: Secondary | ICD-10-CM | POA: Diagnosis not present

## 2020-03-01 DIAGNOSIS — Z87440 Personal history of urinary (tract) infections: Secondary | ICD-10-CM | POA: Diagnosis not present

## 2020-03-01 DIAGNOSIS — F0150 Vascular dementia without behavioral disturbance: Secondary | ICD-10-CM | POA: Diagnosis not present

## 2020-03-01 DIAGNOSIS — F419 Anxiety disorder, unspecified: Secondary | ICD-10-CM | POA: Diagnosis not present

## 2020-03-01 DIAGNOSIS — I679 Cerebrovascular disease, unspecified: Secondary | ICD-10-CM | POA: Diagnosis not present

## 2020-03-01 DIAGNOSIS — I2510 Atherosclerotic heart disease of native coronary artery without angina pectoris: Secondary | ICD-10-CM | POA: Diagnosis not present

## 2020-03-01 DIAGNOSIS — I5032 Chronic diastolic (congestive) heart failure: Secondary | ICD-10-CM | POA: Diagnosis not present

## 2020-03-04 DIAGNOSIS — I679 Cerebrovascular disease, unspecified: Secondary | ICD-10-CM | POA: Diagnosis not present

## 2020-03-04 DIAGNOSIS — I5032 Chronic diastolic (congestive) heart failure: Secondary | ICD-10-CM | POA: Diagnosis not present

## 2020-03-04 DIAGNOSIS — F419 Anxiety disorder, unspecified: Secondary | ICD-10-CM | POA: Diagnosis not present

## 2020-03-04 DIAGNOSIS — F0150 Vascular dementia without behavioral disturbance: Secondary | ICD-10-CM | POA: Diagnosis not present

## 2020-03-04 DIAGNOSIS — I2510 Atherosclerotic heart disease of native coronary artery without angina pectoris: Secondary | ICD-10-CM | POA: Diagnosis not present

## 2020-03-04 DIAGNOSIS — Z87440 Personal history of urinary (tract) infections: Secondary | ICD-10-CM | POA: Diagnosis not present

## 2020-03-07 DIAGNOSIS — F0150 Vascular dementia without behavioral disturbance: Secondary | ICD-10-CM | POA: Diagnosis not present

## 2020-03-07 DIAGNOSIS — I679 Cerebrovascular disease, unspecified: Secondary | ICD-10-CM | POA: Diagnosis not present

## 2020-03-07 DIAGNOSIS — I2510 Atherosclerotic heart disease of native coronary artery without angina pectoris: Secondary | ICD-10-CM | POA: Diagnosis not present

## 2020-03-07 DIAGNOSIS — F419 Anxiety disorder, unspecified: Secondary | ICD-10-CM | POA: Diagnosis not present

## 2020-03-07 DIAGNOSIS — Z87440 Personal history of urinary (tract) infections: Secondary | ICD-10-CM | POA: Diagnosis not present

## 2020-03-07 DIAGNOSIS — I5032 Chronic diastolic (congestive) heart failure: Secondary | ICD-10-CM | POA: Diagnosis not present

## 2020-03-11 DIAGNOSIS — I5032 Chronic diastolic (congestive) heart failure: Secondary | ICD-10-CM | POA: Diagnosis not present

## 2020-03-11 DIAGNOSIS — I2510 Atherosclerotic heart disease of native coronary artery without angina pectoris: Secondary | ICD-10-CM | POA: Diagnosis not present

## 2020-03-11 DIAGNOSIS — F0150 Vascular dementia without behavioral disturbance: Secondary | ICD-10-CM | POA: Diagnosis not present

## 2020-03-11 DIAGNOSIS — Z87440 Personal history of urinary (tract) infections: Secondary | ICD-10-CM | POA: Diagnosis not present

## 2020-03-11 DIAGNOSIS — F419 Anxiety disorder, unspecified: Secondary | ICD-10-CM | POA: Diagnosis not present

## 2020-03-11 DIAGNOSIS — I679 Cerebrovascular disease, unspecified: Secondary | ICD-10-CM | POA: Diagnosis not present

## 2020-03-14 DIAGNOSIS — I679 Cerebrovascular disease, unspecified: Secondary | ICD-10-CM | POA: Diagnosis not present

## 2020-03-14 DIAGNOSIS — F0150 Vascular dementia without behavioral disturbance: Secondary | ICD-10-CM | POA: Diagnosis not present

## 2020-03-14 DIAGNOSIS — I2510 Atherosclerotic heart disease of native coronary artery without angina pectoris: Secondary | ICD-10-CM | POA: Diagnosis not present

## 2020-03-14 DIAGNOSIS — Z87440 Personal history of urinary (tract) infections: Secondary | ICD-10-CM | POA: Diagnosis not present

## 2020-03-14 DIAGNOSIS — F419 Anxiety disorder, unspecified: Secondary | ICD-10-CM | POA: Diagnosis not present

## 2020-03-14 DIAGNOSIS — I5032 Chronic diastolic (congestive) heart failure: Secondary | ICD-10-CM | POA: Diagnosis not present

## 2020-03-18 DIAGNOSIS — F419 Anxiety disorder, unspecified: Secondary | ICD-10-CM | POA: Diagnosis not present

## 2020-03-18 DIAGNOSIS — I679 Cerebrovascular disease, unspecified: Secondary | ICD-10-CM | POA: Diagnosis not present

## 2020-03-18 DIAGNOSIS — I2510 Atherosclerotic heart disease of native coronary artery without angina pectoris: Secondary | ICD-10-CM | POA: Diagnosis not present

## 2020-03-18 DIAGNOSIS — I5032 Chronic diastolic (congestive) heart failure: Secondary | ICD-10-CM | POA: Diagnosis not present

## 2020-03-18 DIAGNOSIS — Z87440 Personal history of urinary (tract) infections: Secondary | ICD-10-CM | POA: Diagnosis not present

## 2020-03-18 DIAGNOSIS — F0150 Vascular dementia without behavioral disturbance: Secondary | ICD-10-CM | POA: Diagnosis not present

## 2020-03-19 DIAGNOSIS — I679 Cerebrovascular disease, unspecified: Secondary | ICD-10-CM | POA: Diagnosis not present

## 2020-03-19 DIAGNOSIS — F419 Anxiety disorder, unspecified: Secondary | ICD-10-CM | POA: Diagnosis not present

## 2020-03-19 DIAGNOSIS — Z87440 Personal history of urinary (tract) infections: Secondary | ICD-10-CM | POA: Diagnosis not present

## 2020-03-19 DIAGNOSIS — I2510 Atherosclerotic heart disease of native coronary artery without angina pectoris: Secondary | ICD-10-CM | POA: Diagnosis not present

## 2020-03-19 DIAGNOSIS — I5032 Chronic diastolic (congestive) heart failure: Secondary | ICD-10-CM | POA: Diagnosis not present

## 2020-03-19 DIAGNOSIS — F0150 Vascular dementia without behavioral disturbance: Secondary | ICD-10-CM | POA: Diagnosis not present

## 2020-03-21 DIAGNOSIS — Z87440 Personal history of urinary (tract) infections: Secondary | ICD-10-CM | POA: Diagnosis not present

## 2020-03-21 DIAGNOSIS — I2510 Atherosclerotic heart disease of native coronary artery without angina pectoris: Secondary | ICD-10-CM | POA: Diagnosis not present

## 2020-03-21 DIAGNOSIS — F0150 Vascular dementia without behavioral disturbance: Secondary | ICD-10-CM | POA: Diagnosis not present

## 2020-03-21 DIAGNOSIS — F419 Anxiety disorder, unspecified: Secondary | ICD-10-CM | POA: Diagnosis not present

## 2020-03-21 DIAGNOSIS — I679 Cerebrovascular disease, unspecified: Secondary | ICD-10-CM | POA: Diagnosis not present

## 2020-03-21 DIAGNOSIS — I5032 Chronic diastolic (congestive) heart failure: Secondary | ICD-10-CM | POA: Diagnosis not present

## 2020-03-22 DIAGNOSIS — I679 Cerebrovascular disease, unspecified: Secondary | ICD-10-CM | POA: Diagnosis not present

## 2020-03-22 DIAGNOSIS — F419 Anxiety disorder, unspecified: Secondary | ICD-10-CM | POA: Diagnosis not present

## 2020-03-22 DIAGNOSIS — Z87440 Personal history of urinary (tract) infections: Secondary | ICD-10-CM | POA: Diagnosis not present

## 2020-03-22 DIAGNOSIS — I5032 Chronic diastolic (congestive) heart failure: Secondary | ICD-10-CM | POA: Diagnosis not present

## 2020-03-22 DIAGNOSIS — F0150 Vascular dementia without behavioral disturbance: Secondary | ICD-10-CM | POA: Diagnosis not present

## 2020-03-22 DIAGNOSIS — I2510 Atherosclerotic heart disease of native coronary artery without angina pectoris: Secondary | ICD-10-CM | POA: Diagnosis not present

## 2020-03-25 DIAGNOSIS — I679 Cerebrovascular disease, unspecified: Secondary | ICD-10-CM | POA: Diagnosis not present

## 2020-03-25 DIAGNOSIS — E039 Hypothyroidism, unspecified: Secondary | ICD-10-CM | POA: Diagnosis not present

## 2020-03-25 DIAGNOSIS — F419 Anxiety disorder, unspecified: Secondary | ICD-10-CM | POA: Diagnosis not present

## 2020-03-25 DIAGNOSIS — Z9981 Dependence on supplemental oxygen: Secondary | ICD-10-CM | POA: Diagnosis not present

## 2020-03-25 DIAGNOSIS — Z87440 Personal history of urinary (tract) infections: Secondary | ICD-10-CM | POA: Diagnosis not present

## 2020-03-25 DIAGNOSIS — F0150 Vascular dementia without behavioral disturbance: Secondary | ICD-10-CM | POA: Diagnosis not present

## 2020-03-25 DIAGNOSIS — I5032 Chronic diastolic (congestive) heart failure: Secondary | ICD-10-CM | POA: Diagnosis not present

## 2020-03-25 DIAGNOSIS — E440 Moderate protein-calorie malnutrition: Secondary | ICD-10-CM | POA: Diagnosis not present

## 2020-03-25 DIAGNOSIS — R1310 Dysphagia, unspecified: Secondary | ICD-10-CM | POA: Diagnosis not present

## 2020-03-25 DIAGNOSIS — I77810 Thoracic aortic ectasia: Secondary | ICD-10-CM | POA: Diagnosis not present

## 2020-03-25 DIAGNOSIS — Z8673 Personal history of transient ischemic attack (TIA), and cerebral infarction without residual deficits: Secondary | ICD-10-CM | POA: Diagnosis not present

## 2020-03-25 DIAGNOSIS — I2510 Atherosclerotic heart disease of native coronary artery without angina pectoris: Secondary | ICD-10-CM | POA: Diagnosis not present

## 2020-03-26 DIAGNOSIS — R1310 Dysphagia, unspecified: Secondary | ICD-10-CM | POA: Diagnosis not present

## 2020-03-26 DIAGNOSIS — Z87440 Personal history of urinary (tract) infections: Secondary | ICD-10-CM | POA: Diagnosis not present

## 2020-03-26 DIAGNOSIS — F0150 Vascular dementia without behavioral disturbance: Secondary | ICD-10-CM | POA: Diagnosis not present

## 2020-03-26 DIAGNOSIS — I5032 Chronic diastolic (congestive) heart failure: Secondary | ICD-10-CM | POA: Diagnosis not present

## 2020-03-26 DIAGNOSIS — I679 Cerebrovascular disease, unspecified: Secondary | ICD-10-CM | POA: Diagnosis not present

## 2020-03-26 DIAGNOSIS — Z9981 Dependence on supplemental oxygen: Secondary | ICD-10-CM | POA: Diagnosis not present

## 2020-03-28 DIAGNOSIS — Z9981 Dependence on supplemental oxygen: Secondary | ICD-10-CM | POA: Diagnosis not present

## 2020-03-28 DIAGNOSIS — I679 Cerebrovascular disease, unspecified: Secondary | ICD-10-CM | POA: Diagnosis not present

## 2020-03-28 DIAGNOSIS — F0150 Vascular dementia without behavioral disturbance: Secondary | ICD-10-CM | POA: Diagnosis not present

## 2020-03-28 DIAGNOSIS — Z87440 Personal history of urinary (tract) infections: Secondary | ICD-10-CM | POA: Diagnosis not present

## 2020-03-28 DIAGNOSIS — I5032 Chronic diastolic (congestive) heart failure: Secondary | ICD-10-CM | POA: Diagnosis not present

## 2020-03-28 DIAGNOSIS — R1310 Dysphagia, unspecified: Secondary | ICD-10-CM | POA: Diagnosis not present

## 2020-03-29 ENCOUNTER — Other Ambulatory Visit: Payer: Self-pay | Admitting: Cardiovascular Disease

## 2020-03-29 DIAGNOSIS — F0150 Vascular dementia without behavioral disturbance: Secondary | ICD-10-CM | POA: Diagnosis not present

## 2020-03-29 DIAGNOSIS — Z9981 Dependence on supplemental oxygen: Secondary | ICD-10-CM | POA: Diagnosis not present

## 2020-03-29 DIAGNOSIS — I5032 Chronic diastolic (congestive) heart failure: Secondary | ICD-10-CM | POA: Diagnosis not present

## 2020-03-29 DIAGNOSIS — R1310 Dysphagia, unspecified: Secondary | ICD-10-CM | POA: Diagnosis not present

## 2020-03-29 DIAGNOSIS — I679 Cerebrovascular disease, unspecified: Secondary | ICD-10-CM | POA: Diagnosis not present

## 2020-03-29 DIAGNOSIS — Z87440 Personal history of urinary (tract) infections: Secondary | ICD-10-CM | POA: Diagnosis not present

## 2020-03-30 DIAGNOSIS — F0150 Vascular dementia without behavioral disturbance: Secondary | ICD-10-CM | POA: Diagnosis not present

## 2020-03-30 DIAGNOSIS — I679 Cerebrovascular disease, unspecified: Secondary | ICD-10-CM | POA: Diagnosis not present

## 2020-03-30 DIAGNOSIS — Z87440 Personal history of urinary (tract) infections: Secondary | ICD-10-CM | POA: Diagnosis not present

## 2020-03-30 DIAGNOSIS — I5032 Chronic diastolic (congestive) heart failure: Secondary | ICD-10-CM | POA: Diagnosis not present

## 2020-03-30 DIAGNOSIS — R1310 Dysphagia, unspecified: Secondary | ICD-10-CM | POA: Diagnosis not present

## 2020-03-30 DIAGNOSIS — Z9981 Dependence on supplemental oxygen: Secondary | ICD-10-CM | POA: Diagnosis not present

## 2020-04-01 DIAGNOSIS — I679 Cerebrovascular disease, unspecified: Secondary | ICD-10-CM | POA: Diagnosis not present

## 2020-04-01 DIAGNOSIS — I5032 Chronic diastolic (congestive) heart failure: Secondary | ICD-10-CM | POA: Diagnosis not present

## 2020-04-01 DIAGNOSIS — Z9981 Dependence on supplemental oxygen: Secondary | ICD-10-CM | POA: Diagnosis not present

## 2020-04-01 DIAGNOSIS — F0150 Vascular dementia without behavioral disturbance: Secondary | ICD-10-CM | POA: Diagnosis not present

## 2020-04-01 DIAGNOSIS — Z87440 Personal history of urinary (tract) infections: Secondary | ICD-10-CM | POA: Diagnosis not present

## 2020-04-01 DIAGNOSIS — R1310 Dysphagia, unspecified: Secondary | ICD-10-CM | POA: Diagnosis not present

## 2020-04-02 DIAGNOSIS — I5032 Chronic diastolic (congestive) heart failure: Secondary | ICD-10-CM | POA: Diagnosis not present

## 2020-04-02 DIAGNOSIS — Z9981 Dependence on supplemental oxygen: Secondary | ICD-10-CM | POA: Diagnosis not present

## 2020-04-02 DIAGNOSIS — F0150 Vascular dementia without behavioral disturbance: Secondary | ICD-10-CM | POA: Diagnosis not present

## 2020-04-02 DIAGNOSIS — I679 Cerebrovascular disease, unspecified: Secondary | ICD-10-CM | POA: Diagnosis not present

## 2020-04-02 DIAGNOSIS — Z87440 Personal history of urinary (tract) infections: Secondary | ICD-10-CM | POA: Diagnosis not present

## 2020-04-02 DIAGNOSIS — R1310 Dysphagia, unspecified: Secondary | ICD-10-CM | POA: Diagnosis not present

## 2020-04-04 DIAGNOSIS — I5032 Chronic diastolic (congestive) heart failure: Secondary | ICD-10-CM | POA: Diagnosis not present

## 2020-04-04 DIAGNOSIS — I679 Cerebrovascular disease, unspecified: Secondary | ICD-10-CM | POA: Diagnosis not present

## 2020-04-04 DIAGNOSIS — R1310 Dysphagia, unspecified: Secondary | ICD-10-CM | POA: Diagnosis not present

## 2020-04-04 DIAGNOSIS — Z9981 Dependence on supplemental oxygen: Secondary | ICD-10-CM | POA: Diagnosis not present

## 2020-04-04 DIAGNOSIS — F0150 Vascular dementia without behavioral disturbance: Secondary | ICD-10-CM | POA: Diagnosis not present

## 2020-04-04 DIAGNOSIS — Z87440 Personal history of urinary (tract) infections: Secondary | ICD-10-CM | POA: Diagnosis not present

## 2020-04-08 DIAGNOSIS — Z9981 Dependence on supplemental oxygen: Secondary | ICD-10-CM | POA: Diagnosis not present

## 2020-04-08 DIAGNOSIS — I679 Cerebrovascular disease, unspecified: Secondary | ICD-10-CM | POA: Diagnosis not present

## 2020-04-08 DIAGNOSIS — I5032 Chronic diastolic (congestive) heart failure: Secondary | ICD-10-CM | POA: Diagnosis not present

## 2020-04-08 DIAGNOSIS — R1310 Dysphagia, unspecified: Secondary | ICD-10-CM | POA: Diagnosis not present

## 2020-04-08 DIAGNOSIS — Z87440 Personal history of urinary (tract) infections: Secondary | ICD-10-CM | POA: Diagnosis not present

## 2020-04-08 DIAGNOSIS — F0150 Vascular dementia without behavioral disturbance: Secondary | ICD-10-CM | POA: Diagnosis not present

## 2020-04-09 DIAGNOSIS — R1310 Dysphagia, unspecified: Secondary | ICD-10-CM | POA: Diagnosis not present

## 2020-04-09 DIAGNOSIS — Z9981 Dependence on supplemental oxygen: Secondary | ICD-10-CM | POA: Diagnosis not present

## 2020-04-09 DIAGNOSIS — I5032 Chronic diastolic (congestive) heart failure: Secondary | ICD-10-CM | POA: Diagnosis not present

## 2020-04-09 DIAGNOSIS — Z87440 Personal history of urinary (tract) infections: Secondary | ICD-10-CM | POA: Diagnosis not present

## 2020-04-09 DIAGNOSIS — I679 Cerebrovascular disease, unspecified: Secondary | ICD-10-CM | POA: Diagnosis not present

## 2020-04-09 DIAGNOSIS — F0150 Vascular dementia without behavioral disturbance: Secondary | ICD-10-CM | POA: Diagnosis not present

## 2020-04-11 DIAGNOSIS — I679 Cerebrovascular disease, unspecified: Secondary | ICD-10-CM | POA: Diagnosis not present

## 2020-04-11 DIAGNOSIS — I5032 Chronic diastolic (congestive) heart failure: Secondary | ICD-10-CM | POA: Diagnosis not present

## 2020-04-11 DIAGNOSIS — F0150 Vascular dementia without behavioral disturbance: Secondary | ICD-10-CM | POA: Diagnosis not present

## 2020-04-11 DIAGNOSIS — R1310 Dysphagia, unspecified: Secondary | ICD-10-CM | POA: Diagnosis not present

## 2020-04-11 DIAGNOSIS — Z9981 Dependence on supplemental oxygen: Secondary | ICD-10-CM | POA: Diagnosis not present

## 2020-04-11 DIAGNOSIS — Z87440 Personal history of urinary (tract) infections: Secondary | ICD-10-CM | POA: Diagnosis not present

## 2020-04-15 DIAGNOSIS — Z9981 Dependence on supplemental oxygen: Secondary | ICD-10-CM | POA: Diagnosis not present

## 2020-04-15 DIAGNOSIS — Z87440 Personal history of urinary (tract) infections: Secondary | ICD-10-CM | POA: Diagnosis not present

## 2020-04-15 DIAGNOSIS — I679 Cerebrovascular disease, unspecified: Secondary | ICD-10-CM | POA: Diagnosis not present

## 2020-04-15 DIAGNOSIS — F0150 Vascular dementia without behavioral disturbance: Secondary | ICD-10-CM | POA: Diagnosis not present

## 2020-04-15 DIAGNOSIS — R1310 Dysphagia, unspecified: Secondary | ICD-10-CM | POA: Diagnosis not present

## 2020-04-15 DIAGNOSIS — I5032 Chronic diastolic (congestive) heart failure: Secondary | ICD-10-CM | POA: Diagnosis not present

## 2020-04-16 DIAGNOSIS — Z9981 Dependence on supplemental oxygen: Secondary | ICD-10-CM | POA: Diagnosis not present

## 2020-04-16 DIAGNOSIS — Z87440 Personal history of urinary (tract) infections: Secondary | ICD-10-CM | POA: Diagnosis not present

## 2020-04-16 DIAGNOSIS — R1310 Dysphagia, unspecified: Secondary | ICD-10-CM | POA: Diagnosis not present

## 2020-04-16 DIAGNOSIS — I679 Cerebrovascular disease, unspecified: Secondary | ICD-10-CM | POA: Diagnosis not present

## 2020-04-16 DIAGNOSIS — F0150 Vascular dementia without behavioral disturbance: Secondary | ICD-10-CM | POA: Diagnosis not present

## 2020-04-16 DIAGNOSIS — I5032 Chronic diastolic (congestive) heart failure: Secondary | ICD-10-CM | POA: Diagnosis not present

## 2020-04-23 DIAGNOSIS — I679 Cerebrovascular disease, unspecified: Secondary | ICD-10-CM | POA: Diagnosis not present

## 2020-04-23 DIAGNOSIS — R1310 Dysphagia, unspecified: Secondary | ICD-10-CM | POA: Diagnosis not present

## 2020-04-23 DIAGNOSIS — Z87440 Personal history of urinary (tract) infections: Secondary | ICD-10-CM | POA: Diagnosis not present

## 2020-04-23 DIAGNOSIS — I5032 Chronic diastolic (congestive) heart failure: Secondary | ICD-10-CM | POA: Diagnosis not present

## 2020-04-23 DIAGNOSIS — Z9981 Dependence on supplemental oxygen: Secondary | ICD-10-CM | POA: Diagnosis not present

## 2020-04-23 DIAGNOSIS — F0150 Vascular dementia without behavioral disturbance: Secondary | ICD-10-CM | POA: Diagnosis not present

## 2020-04-24 DIAGNOSIS — R1310 Dysphagia, unspecified: Secondary | ICD-10-CM | POA: Diagnosis not present

## 2020-04-24 DIAGNOSIS — F419 Anxiety disorder, unspecified: Secondary | ICD-10-CM | POA: Diagnosis not present

## 2020-04-24 DIAGNOSIS — E440 Moderate protein-calorie malnutrition: Secondary | ICD-10-CM | POA: Diagnosis not present

## 2020-04-24 DIAGNOSIS — I2510 Atherosclerotic heart disease of native coronary artery without angina pectoris: Secondary | ICD-10-CM | POA: Diagnosis not present

## 2020-04-24 DIAGNOSIS — Z87440 Personal history of urinary (tract) infections: Secondary | ICD-10-CM | POA: Diagnosis not present

## 2020-04-24 DIAGNOSIS — Z9981 Dependence on supplemental oxygen: Secondary | ICD-10-CM | POA: Diagnosis not present

## 2020-04-24 DIAGNOSIS — I5032 Chronic diastolic (congestive) heart failure: Secondary | ICD-10-CM | POA: Diagnosis not present

## 2020-04-24 DIAGNOSIS — I77810 Thoracic aortic ectasia: Secondary | ICD-10-CM | POA: Diagnosis not present

## 2020-04-24 DIAGNOSIS — F0150 Vascular dementia without behavioral disturbance: Secondary | ICD-10-CM | POA: Diagnosis not present

## 2020-04-24 DIAGNOSIS — E039 Hypothyroidism, unspecified: Secondary | ICD-10-CM | POA: Diagnosis not present

## 2020-04-24 DIAGNOSIS — Z8673 Personal history of transient ischemic attack (TIA), and cerebral infarction without residual deficits: Secondary | ICD-10-CM | POA: Diagnosis not present

## 2020-04-24 DIAGNOSIS — I679 Cerebrovascular disease, unspecified: Secondary | ICD-10-CM | POA: Diagnosis not present

## 2020-04-24 DIAGNOSIS — U071 COVID-19: Secondary | ICD-10-CM | POA: Diagnosis not present

## 2020-04-25 DIAGNOSIS — Z87440 Personal history of urinary (tract) infections: Secondary | ICD-10-CM | POA: Diagnosis not present

## 2020-04-25 DIAGNOSIS — I679 Cerebrovascular disease, unspecified: Secondary | ICD-10-CM | POA: Diagnosis not present

## 2020-04-25 DIAGNOSIS — I5032 Chronic diastolic (congestive) heart failure: Secondary | ICD-10-CM | POA: Diagnosis not present

## 2020-04-25 DIAGNOSIS — R1310 Dysphagia, unspecified: Secondary | ICD-10-CM | POA: Diagnosis not present

## 2020-04-25 DIAGNOSIS — U071 COVID-19: Secondary | ICD-10-CM | POA: Diagnosis not present

## 2020-04-25 DIAGNOSIS — F0150 Vascular dementia without behavioral disturbance: Secondary | ICD-10-CM | POA: Diagnosis not present

## 2020-04-29 DIAGNOSIS — F0150 Vascular dementia without behavioral disturbance: Secondary | ICD-10-CM | POA: Diagnosis not present

## 2020-04-29 DIAGNOSIS — I679 Cerebrovascular disease, unspecified: Secondary | ICD-10-CM | POA: Diagnosis not present

## 2020-04-29 DIAGNOSIS — U071 COVID-19: Secondary | ICD-10-CM | POA: Diagnosis not present

## 2020-04-29 DIAGNOSIS — R1310 Dysphagia, unspecified: Secondary | ICD-10-CM | POA: Diagnosis not present

## 2020-04-29 DIAGNOSIS — Z87440 Personal history of urinary (tract) infections: Secondary | ICD-10-CM | POA: Diagnosis not present

## 2020-04-29 DIAGNOSIS — I5032 Chronic diastolic (congestive) heart failure: Secondary | ICD-10-CM | POA: Diagnosis not present

## 2020-04-30 DIAGNOSIS — F0150 Vascular dementia without behavioral disturbance: Secondary | ICD-10-CM | POA: Diagnosis not present

## 2020-04-30 DIAGNOSIS — R1310 Dysphagia, unspecified: Secondary | ICD-10-CM | POA: Diagnosis not present

## 2020-04-30 DIAGNOSIS — I679 Cerebrovascular disease, unspecified: Secondary | ICD-10-CM | POA: Diagnosis not present

## 2020-04-30 DIAGNOSIS — U071 COVID-19: Secondary | ICD-10-CM | POA: Diagnosis not present

## 2020-04-30 DIAGNOSIS — I5032 Chronic diastolic (congestive) heart failure: Secondary | ICD-10-CM | POA: Diagnosis not present

## 2020-04-30 DIAGNOSIS — Z87440 Personal history of urinary (tract) infections: Secondary | ICD-10-CM | POA: Diagnosis not present

## 2020-05-01 DIAGNOSIS — I679 Cerebrovascular disease, unspecified: Secondary | ICD-10-CM | POA: Diagnosis not present

## 2020-05-01 DIAGNOSIS — Z87440 Personal history of urinary (tract) infections: Secondary | ICD-10-CM | POA: Diagnosis not present

## 2020-05-01 DIAGNOSIS — F0150 Vascular dementia without behavioral disturbance: Secondary | ICD-10-CM | POA: Diagnosis not present

## 2020-05-01 DIAGNOSIS — U071 COVID-19: Secondary | ICD-10-CM | POA: Diagnosis not present

## 2020-05-01 DIAGNOSIS — I5032 Chronic diastolic (congestive) heart failure: Secondary | ICD-10-CM | POA: Diagnosis not present

## 2020-05-01 DIAGNOSIS — R1310 Dysphagia, unspecified: Secondary | ICD-10-CM | POA: Diagnosis not present

## 2020-05-02 DIAGNOSIS — U071 COVID-19: Secondary | ICD-10-CM | POA: Diagnosis not present

## 2020-05-02 DIAGNOSIS — R1310 Dysphagia, unspecified: Secondary | ICD-10-CM | POA: Diagnosis not present

## 2020-05-02 DIAGNOSIS — F0150 Vascular dementia without behavioral disturbance: Secondary | ICD-10-CM | POA: Diagnosis not present

## 2020-05-02 DIAGNOSIS — I5032 Chronic diastolic (congestive) heart failure: Secondary | ICD-10-CM | POA: Diagnosis not present

## 2020-05-02 DIAGNOSIS — I679 Cerebrovascular disease, unspecified: Secondary | ICD-10-CM | POA: Diagnosis not present

## 2020-05-02 DIAGNOSIS — Z87440 Personal history of urinary (tract) infections: Secondary | ICD-10-CM | POA: Diagnosis not present

## 2020-05-06 DIAGNOSIS — Z87440 Personal history of urinary (tract) infections: Secondary | ICD-10-CM | POA: Diagnosis not present

## 2020-05-06 DIAGNOSIS — U071 COVID-19: Secondary | ICD-10-CM | POA: Diagnosis not present

## 2020-05-06 DIAGNOSIS — I5032 Chronic diastolic (congestive) heart failure: Secondary | ICD-10-CM | POA: Diagnosis not present

## 2020-05-06 DIAGNOSIS — R1310 Dysphagia, unspecified: Secondary | ICD-10-CM | POA: Diagnosis not present

## 2020-05-06 DIAGNOSIS — F0150 Vascular dementia without behavioral disturbance: Secondary | ICD-10-CM | POA: Diagnosis not present

## 2020-05-06 DIAGNOSIS — I679 Cerebrovascular disease, unspecified: Secondary | ICD-10-CM | POA: Diagnosis not present

## 2020-05-07 DIAGNOSIS — R1310 Dysphagia, unspecified: Secondary | ICD-10-CM | POA: Diagnosis not present

## 2020-05-07 DIAGNOSIS — I679 Cerebrovascular disease, unspecified: Secondary | ICD-10-CM | POA: Diagnosis not present

## 2020-05-07 DIAGNOSIS — Z87440 Personal history of urinary (tract) infections: Secondary | ICD-10-CM | POA: Diagnosis not present

## 2020-05-07 DIAGNOSIS — U071 COVID-19: Secondary | ICD-10-CM | POA: Diagnosis not present

## 2020-05-07 DIAGNOSIS — I5032 Chronic diastolic (congestive) heart failure: Secondary | ICD-10-CM | POA: Diagnosis not present

## 2020-05-07 DIAGNOSIS — F0150 Vascular dementia without behavioral disturbance: Secondary | ICD-10-CM | POA: Diagnosis not present

## 2020-05-09 ENCOUNTER — Telehealth: Payer: Self-pay | Admitting: Cardiovascular Disease

## 2020-05-09 DIAGNOSIS — I679 Cerebrovascular disease, unspecified: Secondary | ICD-10-CM | POA: Diagnosis not present

## 2020-05-09 DIAGNOSIS — U071 COVID-19: Secondary | ICD-10-CM | POA: Diagnosis not present

## 2020-05-09 DIAGNOSIS — I5032 Chronic diastolic (congestive) heart failure: Secondary | ICD-10-CM | POA: Diagnosis not present

## 2020-05-09 DIAGNOSIS — R1310 Dysphagia, unspecified: Secondary | ICD-10-CM | POA: Diagnosis not present

## 2020-05-09 DIAGNOSIS — Z87440 Personal history of urinary (tract) infections: Secondary | ICD-10-CM | POA: Diagnosis not present

## 2020-05-09 DIAGNOSIS — F0150 Vascular dementia without behavioral disturbance: Secondary | ICD-10-CM | POA: Diagnosis not present

## 2020-05-09 NOTE — Telephone Encounter (Signed)
Spoke with patient's daughter [name], she informed me that Mrs. Kuwahara is on hospice and did not need a follow up appt.

## 2020-05-13 DIAGNOSIS — I5032 Chronic diastolic (congestive) heart failure: Secondary | ICD-10-CM | POA: Diagnosis not present

## 2020-05-13 DIAGNOSIS — F0150 Vascular dementia without behavioral disturbance: Secondary | ICD-10-CM | POA: Diagnosis not present

## 2020-05-13 DIAGNOSIS — Z87440 Personal history of urinary (tract) infections: Secondary | ICD-10-CM | POA: Diagnosis not present

## 2020-05-13 DIAGNOSIS — R1310 Dysphagia, unspecified: Secondary | ICD-10-CM | POA: Diagnosis not present

## 2020-05-13 DIAGNOSIS — I679 Cerebrovascular disease, unspecified: Secondary | ICD-10-CM | POA: Diagnosis not present

## 2020-05-13 DIAGNOSIS — U071 COVID-19: Secondary | ICD-10-CM | POA: Diagnosis not present

## 2020-05-14 DIAGNOSIS — I679 Cerebrovascular disease, unspecified: Secondary | ICD-10-CM | POA: Diagnosis not present

## 2020-05-14 DIAGNOSIS — Z87440 Personal history of urinary (tract) infections: Secondary | ICD-10-CM | POA: Diagnosis not present

## 2020-05-14 DIAGNOSIS — F0150 Vascular dementia without behavioral disturbance: Secondary | ICD-10-CM | POA: Diagnosis not present

## 2020-05-14 DIAGNOSIS — I5032 Chronic diastolic (congestive) heart failure: Secondary | ICD-10-CM | POA: Diagnosis not present

## 2020-05-14 DIAGNOSIS — R1310 Dysphagia, unspecified: Secondary | ICD-10-CM | POA: Diagnosis not present

## 2020-05-14 DIAGNOSIS — U071 COVID-19: Secondary | ICD-10-CM | POA: Diagnosis not present

## 2020-05-16 DIAGNOSIS — Z87440 Personal history of urinary (tract) infections: Secondary | ICD-10-CM | POA: Diagnosis not present

## 2020-05-16 DIAGNOSIS — R1310 Dysphagia, unspecified: Secondary | ICD-10-CM | POA: Diagnosis not present

## 2020-05-16 DIAGNOSIS — U071 COVID-19: Secondary | ICD-10-CM | POA: Diagnosis not present

## 2020-05-16 DIAGNOSIS — I679 Cerebrovascular disease, unspecified: Secondary | ICD-10-CM | POA: Diagnosis not present

## 2020-05-16 DIAGNOSIS — I5032 Chronic diastolic (congestive) heart failure: Secondary | ICD-10-CM | POA: Diagnosis not present

## 2020-05-16 DIAGNOSIS — F0150 Vascular dementia without behavioral disturbance: Secondary | ICD-10-CM | POA: Diagnosis not present

## 2020-05-20 DIAGNOSIS — I679 Cerebrovascular disease, unspecified: Secondary | ICD-10-CM | POA: Diagnosis not present

## 2020-05-20 DIAGNOSIS — Z87440 Personal history of urinary (tract) infections: Secondary | ICD-10-CM | POA: Diagnosis not present

## 2020-05-20 DIAGNOSIS — F0150 Vascular dementia without behavioral disturbance: Secondary | ICD-10-CM | POA: Diagnosis not present

## 2020-05-20 DIAGNOSIS — R1310 Dysphagia, unspecified: Secondary | ICD-10-CM | POA: Diagnosis not present

## 2020-05-20 DIAGNOSIS — I5032 Chronic diastolic (congestive) heart failure: Secondary | ICD-10-CM | POA: Diagnosis not present

## 2020-05-20 DIAGNOSIS — U071 COVID-19: Secondary | ICD-10-CM | POA: Diagnosis not present

## 2020-05-21 DIAGNOSIS — F0150 Vascular dementia without behavioral disturbance: Secondary | ICD-10-CM | POA: Diagnosis not present

## 2020-05-21 DIAGNOSIS — Z87440 Personal history of urinary (tract) infections: Secondary | ICD-10-CM | POA: Diagnosis not present

## 2020-05-21 DIAGNOSIS — I5032 Chronic diastolic (congestive) heart failure: Secondary | ICD-10-CM | POA: Diagnosis not present

## 2020-05-21 DIAGNOSIS — U071 COVID-19: Secondary | ICD-10-CM | POA: Diagnosis not present

## 2020-05-21 DIAGNOSIS — I679 Cerebrovascular disease, unspecified: Secondary | ICD-10-CM | POA: Diagnosis not present

## 2020-05-21 DIAGNOSIS — R1310 Dysphagia, unspecified: Secondary | ICD-10-CM | POA: Diagnosis not present

## 2020-05-23 DIAGNOSIS — F0150 Vascular dementia without behavioral disturbance: Secondary | ICD-10-CM | POA: Diagnosis not present

## 2020-05-23 DIAGNOSIS — I679 Cerebrovascular disease, unspecified: Secondary | ICD-10-CM | POA: Diagnosis not present

## 2020-05-23 DIAGNOSIS — I5032 Chronic diastolic (congestive) heart failure: Secondary | ICD-10-CM | POA: Diagnosis not present

## 2020-05-23 DIAGNOSIS — Z87440 Personal history of urinary (tract) infections: Secondary | ICD-10-CM | POA: Diagnosis not present

## 2020-05-23 DIAGNOSIS — U071 COVID-19: Secondary | ICD-10-CM | POA: Diagnosis not present

## 2020-05-23 DIAGNOSIS — R1310 Dysphagia, unspecified: Secondary | ICD-10-CM | POA: Diagnosis not present

## 2020-05-24 DIAGNOSIS — R1310 Dysphagia, unspecified: Secondary | ICD-10-CM | POA: Diagnosis not present

## 2020-05-24 DIAGNOSIS — F0150 Vascular dementia without behavioral disturbance: Secondary | ICD-10-CM | POA: Diagnosis not present

## 2020-05-24 DIAGNOSIS — I679 Cerebrovascular disease, unspecified: Secondary | ICD-10-CM | POA: Diagnosis not present

## 2020-05-24 DIAGNOSIS — I5032 Chronic diastolic (congestive) heart failure: Secondary | ICD-10-CM | POA: Diagnosis not present

## 2020-05-24 DIAGNOSIS — U071 COVID-19: Secondary | ICD-10-CM | POA: Diagnosis not present

## 2020-05-24 DIAGNOSIS — Z87440 Personal history of urinary (tract) infections: Secondary | ICD-10-CM | POA: Diagnosis not present

## 2020-05-25 DIAGNOSIS — F0150 Vascular dementia without behavioral disturbance: Secondary | ICD-10-CM | POA: Diagnosis not present

## 2020-05-25 DIAGNOSIS — U071 COVID-19: Secondary | ICD-10-CM | POA: Diagnosis not present

## 2020-05-25 DIAGNOSIS — Z8616 Personal history of COVID-19: Secondary | ICD-10-CM | POA: Diagnosis not present

## 2020-05-25 DIAGNOSIS — E440 Moderate protein-calorie malnutrition: Secondary | ICD-10-CM | POA: Diagnosis not present

## 2020-05-25 DIAGNOSIS — Z9981 Dependence on supplemental oxygen: Secondary | ICD-10-CM | POA: Diagnosis not present

## 2020-05-25 DIAGNOSIS — I77810 Thoracic aortic ectasia: Secondary | ICD-10-CM | POA: Diagnosis not present

## 2020-05-25 DIAGNOSIS — F419 Anxiety disorder, unspecified: Secondary | ICD-10-CM | POA: Diagnosis not present

## 2020-05-25 DIAGNOSIS — E039 Hypothyroidism, unspecified: Secondary | ICD-10-CM | POA: Diagnosis not present

## 2020-05-25 DIAGNOSIS — R1310 Dysphagia, unspecified: Secondary | ICD-10-CM | POA: Diagnosis not present

## 2020-05-25 DIAGNOSIS — Z8673 Personal history of transient ischemic attack (TIA), and cerebral infarction without residual deficits: Secondary | ICD-10-CM | POA: Diagnosis not present

## 2020-05-25 DIAGNOSIS — I2510 Atherosclerotic heart disease of native coronary artery without angina pectoris: Secondary | ICD-10-CM | POA: Diagnosis not present

## 2020-05-25 DIAGNOSIS — Z87440 Personal history of urinary (tract) infections: Secondary | ICD-10-CM | POA: Diagnosis not present

## 2020-05-25 DIAGNOSIS — I5032 Chronic diastolic (congestive) heart failure: Secondary | ICD-10-CM | POA: Diagnosis not present

## 2020-05-25 DIAGNOSIS — I679 Cerebrovascular disease, unspecified: Secondary | ICD-10-CM | POA: Diagnosis not present

## 2020-05-28 DIAGNOSIS — I679 Cerebrovascular disease, unspecified: Secondary | ICD-10-CM | POA: Diagnosis not present

## 2020-05-28 DIAGNOSIS — Z8616 Personal history of COVID-19: Secondary | ICD-10-CM | POA: Diagnosis not present

## 2020-05-28 DIAGNOSIS — F0150 Vascular dementia without behavioral disturbance: Secondary | ICD-10-CM | POA: Diagnosis not present

## 2020-05-28 DIAGNOSIS — R1310 Dysphagia, unspecified: Secondary | ICD-10-CM | POA: Diagnosis not present

## 2020-05-28 DIAGNOSIS — Z87440 Personal history of urinary (tract) infections: Secondary | ICD-10-CM | POA: Diagnosis not present

## 2020-05-28 DIAGNOSIS — U071 COVID-19: Secondary | ICD-10-CM | POA: Diagnosis not present

## 2020-05-29 DIAGNOSIS — U071 COVID-19: Secondary | ICD-10-CM | POA: Diagnosis not present

## 2020-05-29 DIAGNOSIS — F0150 Vascular dementia without behavioral disturbance: Secondary | ICD-10-CM | POA: Diagnosis not present

## 2020-05-29 DIAGNOSIS — I679 Cerebrovascular disease, unspecified: Secondary | ICD-10-CM | POA: Diagnosis not present

## 2020-05-29 DIAGNOSIS — R1310 Dysphagia, unspecified: Secondary | ICD-10-CM | POA: Diagnosis not present

## 2020-05-29 DIAGNOSIS — Z8616 Personal history of COVID-19: Secondary | ICD-10-CM | POA: Diagnosis not present

## 2020-05-29 DIAGNOSIS — Z87440 Personal history of urinary (tract) infections: Secondary | ICD-10-CM | POA: Diagnosis not present

## 2020-05-30 DIAGNOSIS — Z8616 Personal history of COVID-19: Secondary | ICD-10-CM | POA: Diagnosis not present

## 2020-05-30 DIAGNOSIS — U071 COVID-19: Secondary | ICD-10-CM | POA: Diagnosis not present

## 2020-05-30 DIAGNOSIS — R1310 Dysphagia, unspecified: Secondary | ICD-10-CM | POA: Diagnosis not present

## 2020-05-30 DIAGNOSIS — Z87440 Personal history of urinary (tract) infections: Secondary | ICD-10-CM | POA: Diagnosis not present

## 2020-05-30 DIAGNOSIS — F0150 Vascular dementia without behavioral disturbance: Secondary | ICD-10-CM | POA: Diagnosis not present

## 2020-05-30 DIAGNOSIS — I679 Cerebrovascular disease, unspecified: Secondary | ICD-10-CM | POA: Diagnosis not present

## 2020-06-02 DIAGNOSIS — I679 Cerebrovascular disease, unspecified: Secondary | ICD-10-CM | POA: Diagnosis not present

## 2020-06-02 DIAGNOSIS — F0150 Vascular dementia without behavioral disturbance: Secondary | ICD-10-CM | POA: Diagnosis not present

## 2020-06-02 DIAGNOSIS — Z87440 Personal history of urinary (tract) infections: Secondary | ICD-10-CM | POA: Diagnosis not present

## 2020-06-02 DIAGNOSIS — U071 COVID-19: Secondary | ICD-10-CM | POA: Diagnosis not present

## 2020-06-02 DIAGNOSIS — Z8616 Personal history of COVID-19: Secondary | ICD-10-CM | POA: Diagnosis not present

## 2020-06-02 DIAGNOSIS — R1310 Dysphagia, unspecified: Secondary | ICD-10-CM | POA: Diagnosis not present

## 2020-06-03 DIAGNOSIS — F0150 Vascular dementia without behavioral disturbance: Secondary | ICD-10-CM | POA: Diagnosis not present

## 2020-06-03 DIAGNOSIS — Z87440 Personal history of urinary (tract) infections: Secondary | ICD-10-CM | POA: Diagnosis not present

## 2020-06-03 DIAGNOSIS — Z8616 Personal history of COVID-19: Secondary | ICD-10-CM | POA: Diagnosis not present

## 2020-06-03 DIAGNOSIS — U071 COVID-19: Secondary | ICD-10-CM | POA: Diagnosis not present

## 2020-06-03 DIAGNOSIS — I679 Cerebrovascular disease, unspecified: Secondary | ICD-10-CM | POA: Diagnosis not present

## 2020-06-03 DIAGNOSIS — R1310 Dysphagia, unspecified: Secondary | ICD-10-CM | POA: Diagnosis not present

## 2020-06-04 DIAGNOSIS — Z8616 Personal history of COVID-19: Secondary | ICD-10-CM | POA: Diagnosis not present

## 2020-06-04 DIAGNOSIS — F0150 Vascular dementia without behavioral disturbance: Secondary | ICD-10-CM | POA: Diagnosis not present

## 2020-06-04 DIAGNOSIS — I679 Cerebrovascular disease, unspecified: Secondary | ICD-10-CM | POA: Diagnosis not present

## 2020-06-04 DIAGNOSIS — R1310 Dysphagia, unspecified: Secondary | ICD-10-CM | POA: Diagnosis not present

## 2020-06-04 DIAGNOSIS — Z87440 Personal history of urinary (tract) infections: Secondary | ICD-10-CM | POA: Diagnosis not present

## 2020-06-04 DIAGNOSIS — U071 COVID-19: Secondary | ICD-10-CM | POA: Diagnosis not present

## 2020-06-06 DIAGNOSIS — R1310 Dysphagia, unspecified: Secondary | ICD-10-CM | POA: Diagnosis not present

## 2020-06-06 DIAGNOSIS — U071 COVID-19: Secondary | ICD-10-CM | POA: Diagnosis not present

## 2020-06-06 DIAGNOSIS — I679 Cerebrovascular disease, unspecified: Secondary | ICD-10-CM | POA: Diagnosis not present

## 2020-06-06 DIAGNOSIS — F0150 Vascular dementia without behavioral disturbance: Secondary | ICD-10-CM | POA: Diagnosis not present

## 2020-06-06 DIAGNOSIS — Z87440 Personal history of urinary (tract) infections: Secondary | ICD-10-CM | POA: Diagnosis not present

## 2020-06-06 DIAGNOSIS — Z8616 Personal history of COVID-19: Secondary | ICD-10-CM | POA: Diagnosis not present

## 2020-06-11 DIAGNOSIS — U071 COVID-19: Secondary | ICD-10-CM | POA: Diagnosis not present

## 2020-06-11 DIAGNOSIS — F0150 Vascular dementia without behavioral disturbance: Secondary | ICD-10-CM | POA: Diagnosis not present

## 2020-06-11 DIAGNOSIS — R1310 Dysphagia, unspecified: Secondary | ICD-10-CM | POA: Diagnosis not present

## 2020-06-11 DIAGNOSIS — Z8616 Personal history of COVID-19: Secondary | ICD-10-CM | POA: Diagnosis not present

## 2020-06-11 DIAGNOSIS — I679 Cerebrovascular disease, unspecified: Secondary | ICD-10-CM | POA: Diagnosis not present

## 2020-06-11 DIAGNOSIS — Z87440 Personal history of urinary (tract) infections: Secondary | ICD-10-CM | POA: Diagnosis not present

## 2020-06-13 DIAGNOSIS — U071 COVID-19: Secondary | ICD-10-CM | POA: Diagnosis not present

## 2020-06-13 DIAGNOSIS — R1310 Dysphagia, unspecified: Secondary | ICD-10-CM | POA: Diagnosis not present

## 2020-06-13 DIAGNOSIS — I679 Cerebrovascular disease, unspecified: Secondary | ICD-10-CM | POA: Diagnosis not present

## 2020-06-13 DIAGNOSIS — F0150 Vascular dementia without behavioral disturbance: Secondary | ICD-10-CM | POA: Diagnosis not present

## 2020-06-13 DIAGNOSIS — Z8616 Personal history of COVID-19: Secondary | ICD-10-CM | POA: Diagnosis not present

## 2020-06-13 DIAGNOSIS — Z87440 Personal history of urinary (tract) infections: Secondary | ICD-10-CM | POA: Diagnosis not present

## 2020-06-14 ENCOUNTER — Telehealth: Payer: Self-pay

## 2020-06-14 NOTE — Telephone Encounter (Signed)
I connected by phone with [name] and/or patient's caregiver on 06/14/2020 at 11:35 AM to discuss the potential vaccination through our Homebound vaccination initiative.   Prevaccination Checklist for COVID-19 Vaccines  1.  Are you feeling sick today? no  2.  Have you ever received a dose of a COVID-19 vaccine?  no      If yes, which one? None   How many dose of Covid-19 vaccine have your received and dates ? 0   Check all that apply: I live in a long-term care setting. no  I have been diagnosed with a medical condition(s). Please list: 87 years old, daughter reports she has dementia and is now under Hospice care. (pertinent to homebound status)  I am a first responder. no  I work in a long-term care facility, correctional facility, hospital, restaurant, retail setting, school, or other setting with high exposure to the public. no  4. Do you have a health condition or are you undergoing treatment that makes you moderately or severely immunocompromised? (This would include treatment for cancer or HIV, receipt of organ transplant, immunosuppressive therapy or high-dose corticosteroids, CAR-T-cell therapy, hematopoietic cell transplant [HCT], DiGeorge syndrome or Wiskott-Aldrich syndrome)  no  5. Have you received hematopoietic cell transplant (HCT) or CAR-T-cell therapies since receiving COVID-19 vaccine? no  6.  Have you ever had an allergic reaction: (This would include a severe reaction [ e.g., anaphylaxis] that required treatment with epinephrine or EpiPen or that caused you to go to the hospital.  It would also include an allergic reaction that occurred within 4 hours that caused hives, swelling, or respiratory distress, including wheezing.) A.  A previous dose of COVID-19 vaccine. no  B.  A vaccine or injectable therapy that contains multiple components, one of which is a COVID-19 vaccine component, but it is not known which component elicited the immediate reaction. no  C.  Are you  allergic to polyethylene glycol? no  D. Are you allergic to Polysorbate, which is found in some vaccines, film coated tablets and intravenous steroids?  no   7.  Have you ever had an allergic reaction to another vaccine (other than COVID-19 vaccine) or an injectable medication? (This would include a severe reaction [ e.g., anaphylaxis] that required treatment with epinephrine or EpiPen or that caused you to go to the hospital.  It would also include an allergic reaction that occurred within 4 hours that caused hives, swelling, or respiratory distress, including wheezing.)  no   8.  Have you ever had a severe allergic reaction (e.g., anaphylaxis) to something other than a component of the COVID-19 vaccine, or any vaccine or injectable medication?  This would include food, pet, venom, environmental, or oral medication allergies.  no   Check all that apply to you:  Am a female between ages 77 and 39 years old  no  Women 75 through 85 years of age can receive any FDA-authorized or -approved COVID-19 vaccine. However, they should be informed of the rare but increased risk of thrombosis with thrombocytopenia syndrome (TTS) after receipt of the [employer] Vaccine and the availability of other FDA-authorized and -approved COVID-19 vaccines. People who had TTS after a first dose of Janssen vaccine should not receive a subsequent dose of Janssen product    Am a female between ages 83 and 66 years old  no Males 5 through 85 years of age may receive the correct formulation of Pfizer-BioNTech COVID-19 vaccine. Males 18 and older can receive any FDA-authorized or -  approved vaccine. However, people receiving an mRNA COVID-19 vaccine, especially males 61 through 85 years of age and their parents/legal representative (when relevant), should be informed of the risk of developing myocarditis (an inflammation of the heart muscle) or pericarditis (inflammation of the lining around the heart) after receipt of an mRNA  vaccine. The risk of developing either myocarditis or pericarditis after vaccination is low, and lower than the risk of myocarditis associated with SARS-CoV-2 infection in adolescents and adults. Vaccine recipients should be counseled about the need to seek care if symptoms of myocarditis or pericarditis develop after vaccination     Have a history of myocarditis or pericarditis  no Myocarditis or pericarditis after receipt of the first dose of an mRNA COVID-19 vaccine series but before administration of the second dose  Experts advise that people who develop myocarditis or pericarditis after a dose of an mRNA COVID-19 vaccine not receive a subsequent dose of any COVID-19 vaccine, until additional safety data are available.  Administration of a subsequent dose of COVID-19 vaccine before safety data are available can be considered in certain circumstances after the episode of myocarditis or pericarditis has completely resolved. Until additional data are available, some experts recommend a Alphonsa Overall COVID-19 vaccine be considered instead of an mRNA COVID-19 vaccine. Decisions about proceeding with a subsequent dose should include a conversation between the patient, their parent/legal representative (when relevant), and their clinical team, which may include a cardiologist.    Have been treated with monoclonal antibodies or convalescent serum to prevent or treat COVID-19  no Vaccination should be offered to people regardless of history of prior symptomatic or asymptomatic SARS-CoV-2 infection. There is no recommended minimal interval between infection and vaccination.  However, vaccination should be deferred if a patient received monoclonal antibodies or convalescent serum as treatment for COVID-19 or for post-exposure prophylaxis. This is a precautionary measure until additional information becomes available, to avoid interference of the antibody treatment with vaccine-induced immune responses.  Defer  COVID-19 vaccination for 30 days when a passive antibody product was used for post-exposure prophylaxis.  Defer COVID-19 vaccination for 90 days when a passive antibody product was used to treat COVID-19.     Diagnosed with Multisystem Inflammatory Syndrome (MIS-C or MIS-A) after a COVID-19 infection  no It is unknown if people with a history of MIS-C or MIS-A are at risk for a dysregulated immune response to COVID-19 vaccination.  People with a history of MIS-C or MIS-A may choose to be vaccinated. Considerations for vaccination may include:   Clinical recovery from MIS-C or MIS-A, including return to normal cardiac function   Personal risk of severe acute COVID-19 (e.g., age, underlying conditions)   High or substantial community transmission of SARS-CoV-2 and personal increased risk of reinfection.   Timing of any immunomodulatory therapies (general best practice guidelines for immunization can be consulted for more information [email or web address])   It has been 90 days or more since their diagnosis of MIS-C   Onset of MIS-C occurred before any COVID-19 vaccination   A conversation between the patient, their guardian(s), and their clinical team or a specialist may assist with COVID-19 vaccination decisions. Healthcare providers and health departments may also request a consultation from the [place] at [email or web address].     Have a bleeding disorder  no Take a blood thinner  no As with all vaccines, any COVID-19 vaccine product may be given to these patients, if a physician familiar with the patient's bleeding risk determines that the  vaccine can be administered intramuscularly with reasonable safety.  ACIP recommends the following technique for intramuscular vaccination in patients with bleeding disorders or taking blood thinners: a fine-gauge needle  (23-gauge or smaller caliber) should be used for the vaccination, followed by firm pressure on the site, without rubbing, for at least 2 minutes.  People who regularly take aspirin or anticoagulants as part of their routine medications do not need to stop these medications prior to receipt of any COVID-19 vaccine.    Have a history of heparin-induced thrombocytopenia (HIT)  no Although the etiology of TTS associated with the Alphonsa Overall COVID-19 vaccine is unclear, it appears to be similar to another rare immune-mediated syndrome, heparin-induced thrombocytopenia (HIT). People with a history of an episode of an immune-mediated syndrome characterized by thrombosis and thrombocytopenia, such as HIT, should be offered a currently FDA-approved or FDA-authorized mRNA COVID-19 vaccine if it has been ?90 days since their TTS resolved. After 90 days, patients may be vaccinated with any currently FDA-approved or FDA-authorized COVID-19 vaccine, including Janssen COVID-19 Vaccine. However, people who developed TTS after their initial Alphonsa Overall vaccine should not receive a Janssen booster dose.  Experts believe the following factors do not make people more susceptible to TTS after receipt of the [employer]. People with these conditions can be vaccinated with any FDA-authorized or - approved COVID-19 vaccine, including the [employer] COVID-19 Vaccine:   A prior history of venous thromboembolism   Risk factors for venous thromboembolism (e.g., inherited or acquired thrombophilia including Factor V Leiden; prothrombin gene 20210A mutation; antiphospholipid syndrome; protein C, protein S or antithrombin deficiency   A prior history of other types of thromboses not associated with thrombocytopenia   Pregnancy, post-partum status, or receipt of hormonal contraceptives (e.g., combined oral contraceptives, patch, ring)   Additional recipient education materials can be found at [email or web address]  vaccines/safety/JJUpdate.html.    Am currently pregnant or breastfeeding  no Vaccination is recommended for all people aged 19 years and older, including people that are:   Pregnant   Breastfeeding   Trying to get pregnant now or who might become pregnant in the future   Pregnant, breastfeeding, and post-partum people 46 through 85 years of age should be aware of the rare risk of TTS after receipt of the Alphonsa Overall COVID-19 Vaccine and the availability of other FDA-authorized or -approved COVID-19 vaccines (i.e., mRNA vaccines).    Have received dermal fillers  no FDA-authorized or -approved COVID-19 vaccines can be administered to people who have received injectable dermal fillers who have no contraindications for vaccination.  Infrequently, these people might experience temporary swelling at or near the site of filler injection (usually the face or lips) following administration of a dose of an mRNA COVID-19 vaccine. These people should be advised to contact their healthcare provider if swelling develops at or near the site of dermal filler following vaccination.     Have a history of Guillain-Barr Syndrome (GBS)  no People with a history of GBS can receive any FDA-authorized or -approved COVID-19 vaccine. However, given the possible association between the [employer] and an increased risk of GBS, a patient with a history of GBS and their clinical team should discuss the availability of mRNA vaccines to offer protection against COVID-19. The highest risk has been observed in men aged 7-64 years with symptoms of GBS beginning within 42 days after Alphonsa Overall COVID-19 vaccination.  People who had GBS after receiving Janssen vaccine should be made aware of the option to receive an mRNA COVID-19  vaccine booster at least 2 months (8 weeks) after the Janssen dose. However, Alphonsa Overall vaccine may be used as a booster, particularly if GBS occurred more than 42 days after vaccination or was related  to a non-vaccine factor. Prior to booster vaccination, a conversation between the patient and their clinical team may assist with decisions about use of a COVID-19 booster dose, including the timing of administration     Postvaccination Observation Times for People without Contraindications to Covid 19 Vaccination.  30 minutes:  People with a history of: A contraindication to another type of COVID-19 vaccine product (i.e., mRNA or viral vector COVID-19 vaccines)   Immediate (within 4 hours of exposure) non-severe allergic reaction to a COVID-19 vaccine or injectable therapies   Anaphylaxis due to any cause   Immediate allergic reaction of any severity to a non-COVID-19 vaccine   15 minutes: All other people  This patient is a 87 y.o. female that meets the FDA criteria to receive homebound vaccination. Patient or parent/caregiver understands they have the option to accept or refuse homebound vaccination.  Patient passed the pre-screening checklist and would like to proceed with homebound vaccination.  Based on questionnaire above, I recommend the patient be observed for 15 minutes.  There are an estimated #0 other household members/caregivers who are also interested in receiving the vaccine.    The patient has been confirmed homebound and eligible for homebound vaccination with the considerations outlined above. I will send the patient's information to our scheduling team who will reach out to schedule the patient and potential caregiver/family members for homebound vaccination.    Dan Humphreys 06/14/2020 11:35 AM

## 2020-06-17 DIAGNOSIS — Z87440 Personal history of urinary (tract) infections: Secondary | ICD-10-CM | POA: Diagnosis not present

## 2020-06-17 DIAGNOSIS — Z8616 Personal history of COVID-19: Secondary | ICD-10-CM | POA: Diagnosis not present

## 2020-06-17 DIAGNOSIS — I679 Cerebrovascular disease, unspecified: Secondary | ICD-10-CM | POA: Diagnosis not present

## 2020-06-17 DIAGNOSIS — R1310 Dysphagia, unspecified: Secondary | ICD-10-CM | POA: Diagnosis not present

## 2020-06-17 DIAGNOSIS — F0150 Vascular dementia without behavioral disturbance: Secondary | ICD-10-CM | POA: Diagnosis not present

## 2020-06-17 DIAGNOSIS — U071 COVID-19: Secondary | ICD-10-CM | POA: Diagnosis not present

## 2020-06-18 DIAGNOSIS — Z87440 Personal history of urinary (tract) infections: Secondary | ICD-10-CM | POA: Diagnosis not present

## 2020-06-18 DIAGNOSIS — F0150 Vascular dementia without behavioral disturbance: Secondary | ICD-10-CM | POA: Diagnosis not present

## 2020-06-18 DIAGNOSIS — R1310 Dysphagia, unspecified: Secondary | ICD-10-CM | POA: Diagnosis not present

## 2020-06-18 DIAGNOSIS — U071 COVID-19: Secondary | ICD-10-CM | POA: Diagnosis not present

## 2020-06-18 DIAGNOSIS — Z8616 Personal history of COVID-19: Secondary | ICD-10-CM | POA: Diagnosis not present

## 2020-06-18 DIAGNOSIS — I679 Cerebrovascular disease, unspecified: Secondary | ICD-10-CM | POA: Diagnosis not present

## 2020-06-20 ENCOUNTER — Ambulatory Visit: Payer: Medicare Other | Attending: Internal Medicine

## 2020-06-20 DIAGNOSIS — R1310 Dysphagia, unspecified: Secondary | ICD-10-CM | POA: Diagnosis not present

## 2020-06-20 DIAGNOSIS — Z8616 Personal history of COVID-19: Secondary | ICD-10-CM | POA: Diagnosis not present

## 2020-06-20 DIAGNOSIS — U071 COVID-19: Secondary | ICD-10-CM | POA: Diagnosis not present

## 2020-06-20 DIAGNOSIS — I679 Cerebrovascular disease, unspecified: Secondary | ICD-10-CM | POA: Diagnosis not present

## 2020-06-20 DIAGNOSIS — Z23 Encounter for immunization: Secondary | ICD-10-CM

## 2020-06-20 DIAGNOSIS — Z87440 Personal history of urinary (tract) infections: Secondary | ICD-10-CM | POA: Diagnosis not present

## 2020-06-20 DIAGNOSIS — F0150 Vascular dementia without behavioral disturbance: Secondary | ICD-10-CM | POA: Diagnosis not present

## 2020-06-20 NOTE — Progress Notes (Signed)
   Covid-19 Vaccination Clinic  Name:  Gina Bowers    MRN: [id number] DOB: 1925-09-18  06/20/2020  Gina Bowers was observed post Covid-19 immunization for 15 minutes without incident. She was provided with Vaccine Information Sheet and instruction to access the V-Safe system.   Gina Bowers was instructed to call 911 with any severe reactions post vaccine: Marland Kitchen Difficulty breathing  . Swelling of face and throat  . A fast heartbeat  . A bad rash all over body  . Dizziness and weakness   Immunizations Administered    Name Date Dose VIS Date Route   PFIZER Comrnaty(Gray TOP) Covid-19 Vaccine 06/20/2020 12:39 PM 0.3 mL 05/02/2020 Intramuscular   Manufacturer: [place]   Lot: [id number]   [place]: [phone]

## 2020-06-23 DIAGNOSIS — Z87440 Personal history of urinary (tract) infections: Secondary | ICD-10-CM | POA: Diagnosis not present

## 2020-06-23 DIAGNOSIS — I679 Cerebrovascular disease, unspecified: Secondary | ICD-10-CM | POA: Diagnosis not present

## 2020-06-23 DIAGNOSIS — F0150 Vascular dementia without behavioral disturbance: Secondary | ICD-10-CM | POA: Diagnosis not present

## 2020-06-23 DIAGNOSIS — R1310 Dysphagia, unspecified: Secondary | ICD-10-CM | POA: Diagnosis not present

## 2020-06-23 DIAGNOSIS — Z8616 Personal history of COVID-19: Secondary | ICD-10-CM | POA: Diagnosis not present

## 2020-06-23 DIAGNOSIS — U071 COVID-19: Secondary | ICD-10-CM | POA: Diagnosis not present

## 2020-06-24 DIAGNOSIS — Z87440 Personal history of urinary (tract) infections: Secondary | ICD-10-CM | POA: Diagnosis not present

## 2020-06-24 DIAGNOSIS — R1310 Dysphagia, unspecified: Secondary | ICD-10-CM | POA: Diagnosis not present

## 2020-06-24 DIAGNOSIS — Z8616 Personal history of COVID-19: Secondary | ICD-10-CM | POA: Diagnosis not present

## 2020-06-24 DIAGNOSIS — I679 Cerebrovascular disease, unspecified: Secondary | ICD-10-CM | POA: Diagnosis not present

## 2020-06-24 DIAGNOSIS — F0150 Vascular dementia without behavioral disturbance: Secondary | ICD-10-CM | POA: Diagnosis not present

## 2020-06-24 DIAGNOSIS — U071 COVID-19: Secondary | ICD-10-CM | POA: Diagnosis not present

## 2020-06-25 ENCOUNTER — Other Ambulatory Visit: Payer: Self-pay | Admitting: Cardiovascular Disease

## 2020-06-25 DIAGNOSIS — I679 Cerebrovascular disease, unspecified: Secondary | ICD-10-CM | POA: Diagnosis not present

## 2020-06-25 DIAGNOSIS — E039 Hypothyroidism, unspecified: Secondary | ICD-10-CM | POA: Diagnosis not present

## 2020-06-25 DIAGNOSIS — F419 Anxiety disorder, unspecified: Secondary | ICD-10-CM | POA: Diagnosis not present

## 2020-06-25 DIAGNOSIS — Z87440 Personal history of urinary (tract) infections: Secondary | ICD-10-CM | POA: Diagnosis not present

## 2020-06-25 DIAGNOSIS — R1310 Dysphagia, unspecified: Secondary | ICD-10-CM | POA: Diagnosis not present

## 2020-06-25 DIAGNOSIS — E440 Moderate protein-calorie malnutrition: Secondary | ICD-10-CM | POA: Diagnosis not present

## 2020-06-25 DIAGNOSIS — I77810 Thoracic aortic ectasia: Secondary | ICD-10-CM | POA: Diagnosis not present

## 2020-06-25 DIAGNOSIS — Z9981 Dependence on supplemental oxygen: Secondary | ICD-10-CM | POA: Diagnosis not present

## 2020-06-25 DIAGNOSIS — I2510 Atherosclerotic heart disease of native coronary artery without angina pectoris: Secondary | ICD-10-CM | POA: Diagnosis not present

## 2020-06-25 DIAGNOSIS — F0150 Vascular dementia without behavioral disturbance: Secondary | ICD-10-CM | POA: Diagnosis not present

## 2020-06-25 DIAGNOSIS — Z8673 Personal history of transient ischemic attack (TIA), and cerebral infarction without residual deficits: Secondary | ICD-10-CM | POA: Diagnosis not present

## 2020-06-25 DIAGNOSIS — Z8616 Personal history of COVID-19: Secondary | ICD-10-CM | POA: Diagnosis not present

## 2020-06-25 DIAGNOSIS — I5032 Chronic diastolic (congestive) heart failure: Secondary | ICD-10-CM | POA: Diagnosis not present

## 2020-06-27 DIAGNOSIS — R1310 Dysphagia, unspecified: Secondary | ICD-10-CM | POA: Diagnosis not present

## 2020-06-27 DIAGNOSIS — F0150 Vascular dementia without behavioral disturbance: Secondary | ICD-10-CM | POA: Diagnosis not present

## 2020-06-27 DIAGNOSIS — I5032 Chronic diastolic (congestive) heart failure: Secondary | ICD-10-CM | POA: Diagnosis not present

## 2020-06-27 DIAGNOSIS — Z87440 Personal history of urinary (tract) infections: Secondary | ICD-10-CM | POA: Diagnosis not present

## 2020-06-27 DIAGNOSIS — I679 Cerebrovascular disease, unspecified: Secondary | ICD-10-CM | POA: Diagnosis not present

## 2020-06-27 DIAGNOSIS — Z8616 Personal history of COVID-19: Secondary | ICD-10-CM | POA: Diagnosis not present

## 2020-07-01 DIAGNOSIS — R1310 Dysphagia, unspecified: Secondary | ICD-10-CM | POA: Diagnosis not present

## 2020-07-01 DIAGNOSIS — Z8616 Personal history of COVID-19: Secondary | ICD-10-CM | POA: Diagnosis not present

## 2020-07-01 DIAGNOSIS — I5032 Chronic diastolic (congestive) heart failure: Secondary | ICD-10-CM | POA: Diagnosis not present

## 2020-07-01 DIAGNOSIS — F0150 Vascular dementia without behavioral disturbance: Secondary | ICD-10-CM | POA: Diagnosis not present

## 2020-07-01 DIAGNOSIS — Z87440 Personal history of urinary (tract) infections: Secondary | ICD-10-CM | POA: Diagnosis not present

## 2020-07-01 DIAGNOSIS — I679 Cerebrovascular disease, unspecified: Secondary | ICD-10-CM | POA: Diagnosis not present

## 2020-07-02 DIAGNOSIS — R1310 Dysphagia, unspecified: Secondary | ICD-10-CM | POA: Diagnosis not present

## 2020-07-02 DIAGNOSIS — I679 Cerebrovascular disease, unspecified: Secondary | ICD-10-CM | POA: Diagnosis not present

## 2020-07-02 DIAGNOSIS — I5032 Chronic diastolic (congestive) heart failure: Secondary | ICD-10-CM | POA: Diagnosis not present

## 2020-07-02 DIAGNOSIS — Z87440 Personal history of urinary (tract) infections: Secondary | ICD-10-CM | POA: Diagnosis not present

## 2020-07-02 DIAGNOSIS — F0150 Vascular dementia without behavioral disturbance: Secondary | ICD-10-CM | POA: Diagnosis not present

## 2020-07-02 DIAGNOSIS — Z8616 Personal history of COVID-19: Secondary | ICD-10-CM | POA: Diagnosis not present

## 2020-07-04 DIAGNOSIS — R1310 Dysphagia, unspecified: Secondary | ICD-10-CM | POA: Diagnosis not present

## 2020-07-04 DIAGNOSIS — I5032 Chronic diastolic (congestive) heart failure: Secondary | ICD-10-CM | POA: Diagnosis not present

## 2020-07-04 DIAGNOSIS — Z87440 Personal history of urinary (tract) infections: Secondary | ICD-10-CM | POA: Diagnosis not present

## 2020-07-04 DIAGNOSIS — I679 Cerebrovascular disease, unspecified: Secondary | ICD-10-CM | POA: Diagnosis not present

## 2020-07-04 DIAGNOSIS — F0150 Vascular dementia without behavioral disturbance: Secondary | ICD-10-CM | POA: Diagnosis not present

## 2020-07-04 DIAGNOSIS — Z8616 Personal history of COVID-19: Secondary | ICD-10-CM | POA: Diagnosis not present

## 2020-07-05 DIAGNOSIS — Z87440 Personal history of urinary (tract) infections: Secondary | ICD-10-CM | POA: Diagnosis not present

## 2020-07-05 DIAGNOSIS — I5032 Chronic diastolic (congestive) heart failure: Secondary | ICD-10-CM | POA: Diagnosis not present

## 2020-07-05 DIAGNOSIS — Z8616 Personal history of COVID-19: Secondary | ICD-10-CM | POA: Diagnosis not present

## 2020-07-05 DIAGNOSIS — R1310 Dysphagia, unspecified: Secondary | ICD-10-CM | POA: Diagnosis not present

## 2020-07-05 DIAGNOSIS — I679 Cerebrovascular disease, unspecified: Secondary | ICD-10-CM | POA: Diagnosis not present

## 2020-07-05 DIAGNOSIS — F0150 Vascular dementia without behavioral disturbance: Secondary | ICD-10-CM | POA: Diagnosis not present

## 2020-07-08 DIAGNOSIS — Z87440 Personal history of urinary (tract) infections: Secondary | ICD-10-CM | POA: Diagnosis not present

## 2020-07-08 DIAGNOSIS — F0150 Vascular dementia without behavioral disturbance: Secondary | ICD-10-CM | POA: Diagnosis not present

## 2020-07-08 DIAGNOSIS — Z8616 Personal history of COVID-19: Secondary | ICD-10-CM | POA: Diagnosis not present

## 2020-07-08 DIAGNOSIS — I5032 Chronic diastolic (congestive) heart failure: Secondary | ICD-10-CM | POA: Diagnosis not present

## 2020-07-08 DIAGNOSIS — R1310 Dysphagia, unspecified: Secondary | ICD-10-CM | POA: Diagnosis not present

## 2020-07-08 DIAGNOSIS — I679 Cerebrovascular disease, unspecified: Secondary | ICD-10-CM | POA: Diagnosis not present

## 2020-07-09 DIAGNOSIS — R1310 Dysphagia, unspecified: Secondary | ICD-10-CM | POA: Diagnosis not present

## 2020-07-09 DIAGNOSIS — F0150 Vascular dementia without behavioral disturbance: Secondary | ICD-10-CM | POA: Diagnosis not present

## 2020-07-09 DIAGNOSIS — I679 Cerebrovascular disease, unspecified: Secondary | ICD-10-CM | POA: Diagnosis not present

## 2020-07-09 DIAGNOSIS — I5032 Chronic diastolic (congestive) heart failure: Secondary | ICD-10-CM | POA: Diagnosis not present

## 2020-07-09 DIAGNOSIS — Z8616 Personal history of COVID-19: Secondary | ICD-10-CM | POA: Diagnosis not present

## 2020-07-09 DIAGNOSIS — Z87440 Personal history of urinary (tract) infections: Secondary | ICD-10-CM | POA: Diagnosis not present

## 2020-07-11 ENCOUNTER — Ambulatory Visit: Payer: Medicare Other | Attending: Internal Medicine

## 2020-07-11 DIAGNOSIS — R1310 Dysphagia, unspecified: Secondary | ICD-10-CM | POA: Diagnosis not present

## 2020-07-11 DIAGNOSIS — I5032 Chronic diastolic (congestive) heart failure: Secondary | ICD-10-CM | POA: Diagnosis not present

## 2020-07-11 DIAGNOSIS — Z87440 Personal history of urinary (tract) infections: Secondary | ICD-10-CM | POA: Diagnosis not present

## 2020-07-11 DIAGNOSIS — F0150 Vascular dementia without behavioral disturbance: Secondary | ICD-10-CM | POA: Diagnosis not present

## 2020-07-11 DIAGNOSIS — Z8616 Personal history of COVID-19: Secondary | ICD-10-CM | POA: Diagnosis not present

## 2020-07-11 DIAGNOSIS — I679 Cerebrovascular disease, unspecified: Secondary | ICD-10-CM | POA: Diagnosis not present

## 2020-07-11 DIAGNOSIS — Z23 Encounter for immunization: Secondary | ICD-10-CM

## 2020-07-11 NOTE — Progress Notes (Signed)
   Covid-19 Vaccination Clinic  Name:  QUIANNA AVERY    MRN: [id number] DOB: 05/04/1926  07/11/2020  Ms. Helms was observed post Covid-19 immunization for 15 minutes without incident. She was provided with Vaccine Information Sheet and instruction to access the V-Safe system.   Ms. Boutwell was instructed to call 911 with any severe reactions post vaccine: Marland Kitchen Difficulty breathing  . Swelling of face and throat  . A fast heartbeat  . A bad rash all over body  . Dizziness and weakness   Immunizations Administered    Name Date Dose VIS Date Route   PFIZER Comrnaty(Gray TOP) Covid-19 Vaccine 07/11/2020 12:52 PM 0.3 mL 05/02/2020 Intramuscular   Manufacturer: [place]   Lot: [id number]   NDC: [id number]

## 2020-07-15 DIAGNOSIS — F0150 Vascular dementia without behavioral disturbance: Secondary | ICD-10-CM | POA: Diagnosis not present

## 2020-07-15 DIAGNOSIS — Z87440 Personal history of urinary (tract) infections: Secondary | ICD-10-CM | POA: Diagnosis not present

## 2020-07-15 DIAGNOSIS — R1310 Dysphagia, unspecified: Secondary | ICD-10-CM | POA: Diagnosis not present

## 2020-07-15 DIAGNOSIS — I679 Cerebrovascular disease, unspecified: Secondary | ICD-10-CM | POA: Diagnosis not present

## 2020-07-15 DIAGNOSIS — Z8616 Personal history of COVID-19: Secondary | ICD-10-CM | POA: Diagnosis not present

## 2020-07-15 DIAGNOSIS — I5032 Chronic diastolic (congestive) heart failure: Secondary | ICD-10-CM | POA: Diagnosis not present

## 2020-07-16 DIAGNOSIS — F0150 Vascular dementia without behavioral disturbance: Secondary | ICD-10-CM | POA: Diagnosis not present

## 2020-07-16 DIAGNOSIS — I679 Cerebrovascular disease, unspecified: Secondary | ICD-10-CM | POA: Diagnosis not present

## 2020-07-16 DIAGNOSIS — I5032 Chronic diastolic (congestive) heart failure: Secondary | ICD-10-CM | POA: Diagnosis not present

## 2020-07-16 DIAGNOSIS — Z87440 Personal history of urinary (tract) infections: Secondary | ICD-10-CM | POA: Diagnosis not present

## 2020-07-16 DIAGNOSIS — R1310 Dysphagia, unspecified: Secondary | ICD-10-CM | POA: Diagnosis not present

## 2020-07-16 DIAGNOSIS — Z8616 Personal history of COVID-19: Secondary | ICD-10-CM | POA: Diagnosis not present

## 2020-07-18 DIAGNOSIS — R1310 Dysphagia, unspecified: Secondary | ICD-10-CM | POA: Diagnosis not present

## 2020-07-18 DIAGNOSIS — Z87440 Personal history of urinary (tract) infections: Secondary | ICD-10-CM | POA: Diagnosis not present

## 2020-07-18 DIAGNOSIS — Z8616 Personal history of COVID-19: Secondary | ICD-10-CM | POA: Diagnosis not present

## 2020-07-18 DIAGNOSIS — I5032 Chronic diastolic (congestive) heart failure: Secondary | ICD-10-CM | POA: Diagnosis not present

## 2020-07-18 DIAGNOSIS — I679 Cerebrovascular disease, unspecified: Secondary | ICD-10-CM | POA: Diagnosis not present

## 2020-07-18 DIAGNOSIS — F0150 Vascular dementia without behavioral disturbance: Secondary | ICD-10-CM | POA: Diagnosis not present

## 2020-07-19 DIAGNOSIS — I679 Cerebrovascular disease, unspecified: Secondary | ICD-10-CM | POA: Diagnosis not present

## 2020-07-19 DIAGNOSIS — I5032 Chronic diastolic (congestive) heart failure: Secondary | ICD-10-CM | POA: Diagnosis not present

## 2020-07-19 DIAGNOSIS — Z6824 Body mass index (BMI) 24.0-24.9, adult: Secondary | ICD-10-CM | POA: Diagnosis not present

## 2020-07-19 DIAGNOSIS — Z872 Personal history of diseases of the skin and subcutaneous tissue: Secondary | ICD-10-CM | POA: Diagnosis not present

## 2020-07-19 DIAGNOSIS — I25119 Atherosclerotic heart disease of native coronary artery with unspecified angina pectoris: Secondary | ICD-10-CM | POA: Diagnosis not present

## 2020-07-19 DIAGNOSIS — I77810 Thoracic aortic ectasia: Secondary | ICD-10-CM | POA: Diagnosis not present

## 2020-07-19 DIAGNOSIS — Z8781 Personal history of (healed) traumatic fracture: Secondary | ICD-10-CM | POA: Diagnosis not present

## 2020-07-19 DIAGNOSIS — R1310 Dysphagia, unspecified: Secondary | ICD-10-CM | POA: Diagnosis not present

## 2020-07-19 DIAGNOSIS — H04129 Dry eye syndrome of unspecified lacrimal gland: Secondary | ICD-10-CM | POA: Diagnosis not present

## 2020-07-19 DIAGNOSIS — E440 Moderate protein-calorie malnutrition: Secondary | ICD-10-CM | POA: Diagnosis not present

## 2020-07-19 DIAGNOSIS — Z8616 Personal history of COVID-19: Secondary | ICD-10-CM | POA: Diagnosis not present

## 2020-07-19 DIAGNOSIS — F0150 Vascular dementia without behavioral disturbance: Secondary | ICD-10-CM | POA: Diagnosis not present

## 2020-07-19 DIAGNOSIS — Z8673 Personal history of transient ischemic attack (TIA), and cerebral infarction without residual deficits: Secondary | ICD-10-CM | POA: Diagnosis not present

## 2020-07-19 DIAGNOSIS — E039 Hypothyroidism, unspecified: Secondary | ICD-10-CM | POA: Diagnosis not present

## 2020-07-19 DIAGNOSIS — Z9981 Dependence on supplemental oxygen: Secondary | ICD-10-CM | POA: Diagnosis not present

## 2020-07-19 DIAGNOSIS — J449 Chronic obstructive pulmonary disease, unspecified: Secondary | ICD-10-CM | POA: Diagnosis not present

## 2020-07-19 DIAGNOSIS — J9611 Chronic respiratory failure with hypoxia: Secondary | ICD-10-CM | POA: Diagnosis not present

## 2020-07-19 DIAGNOSIS — Z87440 Personal history of urinary (tract) infections: Secondary | ICD-10-CM | POA: Diagnosis not present

## 2020-07-19 DIAGNOSIS — F419 Anxiety disorder, unspecified: Secondary | ICD-10-CM | POA: Diagnosis not present

## 2020-07-22 DIAGNOSIS — I5032 Chronic diastolic (congestive) heart failure: Secondary | ICD-10-CM | POA: Diagnosis not present

## 2020-07-22 DIAGNOSIS — F0150 Vascular dementia without behavioral disturbance: Secondary | ICD-10-CM | POA: Diagnosis not present

## 2020-07-22 DIAGNOSIS — G4700 Insomnia, unspecified: Secondary | ICD-10-CM | POA: Diagnosis not present

## 2020-07-22 DIAGNOSIS — K5900 Constipation, unspecified: Secondary | ICD-10-CM | POA: Diagnosis not present

## 2020-07-22 DIAGNOSIS — J9610 Chronic respiratory failure, unspecified whether with hypoxia or hypercapnia: Secondary | ICD-10-CM | POA: Diagnosis not present

## 2020-07-22 DIAGNOSIS — I10 Essential (primary) hypertension: Secondary | ICD-10-CM | POA: Diagnosis not present

## 2020-07-22 DIAGNOSIS — E039 Hypothyroidism, unspecified: Secondary | ICD-10-CM | POA: Diagnosis not present

## 2020-07-22 DIAGNOSIS — I2510 Atherosclerotic heart disease of native coronary artery without angina pectoris: Secondary | ICD-10-CM | POA: Diagnosis not present

## 2020-07-23 DIAGNOSIS — I77810 Thoracic aortic ectasia: Secondary | ICD-10-CM | POA: Diagnosis not present

## 2020-07-23 DIAGNOSIS — Z9981 Dependence on supplemental oxygen: Secondary | ICD-10-CM | POA: Diagnosis not present

## 2020-07-23 DIAGNOSIS — E039 Hypothyroidism, unspecified: Secondary | ICD-10-CM | POA: Diagnosis not present

## 2020-07-23 DIAGNOSIS — E440 Moderate protein-calorie malnutrition: Secondary | ICD-10-CM | POA: Diagnosis not present

## 2020-07-23 DIAGNOSIS — Z872 Personal history of diseases of the skin and subcutaneous tissue: Secondary | ICD-10-CM | POA: Diagnosis not present

## 2020-07-23 DIAGNOSIS — J9611 Chronic respiratory failure with hypoxia: Secondary | ICD-10-CM | POA: Diagnosis not present

## 2020-07-23 DIAGNOSIS — I25119 Atherosclerotic heart disease of native coronary artery with unspecified angina pectoris: Secondary | ICD-10-CM | POA: Diagnosis not present

## 2020-07-23 DIAGNOSIS — Z87440 Personal history of urinary (tract) infections: Secondary | ICD-10-CM | POA: Diagnosis not present

## 2020-07-23 DIAGNOSIS — I5032 Chronic diastolic (congestive) heart failure: Secondary | ICD-10-CM | POA: Diagnosis not present

## 2020-07-23 DIAGNOSIS — Z8673 Personal history of transient ischemic attack (TIA), and cerebral infarction without residual deficits: Secondary | ICD-10-CM | POA: Diagnosis not present

## 2020-07-23 DIAGNOSIS — F419 Anxiety disorder, unspecified: Secondary | ICD-10-CM | POA: Diagnosis not present

## 2020-07-23 DIAGNOSIS — J449 Chronic obstructive pulmonary disease, unspecified: Secondary | ICD-10-CM | POA: Diagnosis not present

## 2020-07-23 DIAGNOSIS — F0150 Vascular dementia without behavioral disturbance: Secondary | ICD-10-CM | POA: Diagnosis not present

## 2020-07-23 DIAGNOSIS — Z8781 Personal history of (healed) traumatic fracture: Secondary | ICD-10-CM | POA: Diagnosis not present

## 2020-07-23 DIAGNOSIS — Z6824 Body mass index (BMI) 24.0-24.9, adult: Secondary | ICD-10-CM | POA: Diagnosis not present

## 2020-07-23 DIAGNOSIS — H04129 Dry eye syndrome of unspecified lacrimal gland: Secondary | ICD-10-CM | POA: Diagnosis not present

## 2020-07-23 DIAGNOSIS — I679 Cerebrovascular disease, unspecified: Secondary | ICD-10-CM | POA: Diagnosis not present

## 2020-07-23 DIAGNOSIS — Z8616 Personal history of COVID-19: Secondary | ICD-10-CM | POA: Diagnosis not present

## 2020-07-24 DIAGNOSIS — I25119 Atherosclerotic heart disease of native coronary artery with unspecified angina pectoris: Secondary | ICD-10-CM | POA: Diagnosis not present

## 2020-07-24 DIAGNOSIS — F419 Anxiety disorder, unspecified: Secondary | ICD-10-CM | POA: Diagnosis not present

## 2020-07-24 DIAGNOSIS — F0150 Vascular dementia without behavioral disturbance: Secondary | ICD-10-CM | POA: Diagnosis not present

## 2020-07-24 DIAGNOSIS — I5032 Chronic diastolic (congestive) heart failure: Secondary | ICD-10-CM | POA: Diagnosis not present

## 2020-07-24 DIAGNOSIS — I679 Cerebrovascular disease, unspecified: Secondary | ICD-10-CM | POA: Diagnosis not present

## 2020-07-24 DIAGNOSIS — J9611 Chronic respiratory failure with hypoxia: Secondary | ICD-10-CM | POA: Diagnosis not present

## 2020-07-25 DIAGNOSIS — I10 Essential (primary) hypertension: Secondary | ICD-10-CM | POA: Diagnosis not present

## 2020-07-25 DIAGNOSIS — F419 Anxiety disorder, unspecified: Secondary | ICD-10-CM | POA: Diagnosis not present

## 2020-07-25 DIAGNOSIS — F32A Depression, unspecified: Secondary | ICD-10-CM | POA: Diagnosis not present

## 2020-07-25 DIAGNOSIS — I2510 Atherosclerotic heart disease of native coronary artery without angina pectoris: Secondary | ICD-10-CM | POA: Diagnosis not present

## 2020-07-25 DIAGNOSIS — F0150 Vascular dementia without behavioral disturbance: Secondary | ICD-10-CM | POA: Diagnosis not present

## 2020-07-25 DIAGNOSIS — J9610 Chronic respiratory failure, unspecified whether with hypoxia or hypercapnia: Secondary | ICD-10-CM | POA: Diagnosis not present

## 2020-07-25 DIAGNOSIS — I5032 Chronic diastolic (congestive) heart failure: Secondary | ICD-10-CM | POA: Diagnosis not present

## 2020-07-25 DIAGNOSIS — E039 Hypothyroidism, unspecified: Secondary | ICD-10-CM | POA: Diagnosis not present

## 2020-07-25 DIAGNOSIS — K5900 Constipation, unspecified: Secondary | ICD-10-CM | POA: Diagnosis not present

## 2020-07-25 DIAGNOSIS — G4700 Insomnia, unspecified: Secondary | ICD-10-CM | POA: Diagnosis not present

## 2020-07-29 DIAGNOSIS — K5900 Constipation, unspecified: Secondary | ICD-10-CM | POA: Diagnosis not present

## 2020-07-29 DIAGNOSIS — I5032 Chronic diastolic (congestive) heart failure: Secondary | ICD-10-CM | POA: Diagnosis not present

## 2020-07-29 DIAGNOSIS — E039 Hypothyroidism, unspecified: Secondary | ICD-10-CM | POA: Diagnosis not present

## 2020-07-29 DIAGNOSIS — G4700 Insomnia, unspecified: Secondary | ICD-10-CM | POA: Diagnosis not present

## 2020-07-29 DIAGNOSIS — I2510 Atherosclerotic heart disease of native coronary artery without angina pectoris: Secondary | ICD-10-CM | POA: Diagnosis not present

## 2020-07-29 DIAGNOSIS — F32A Depression, unspecified: Secondary | ICD-10-CM | POA: Diagnosis not present

## 2020-07-29 DIAGNOSIS — F0150 Vascular dementia without behavioral disturbance: Secondary | ICD-10-CM | POA: Diagnosis not present

## 2020-07-29 DIAGNOSIS — F419 Anxiety disorder, unspecified: Secondary | ICD-10-CM | POA: Diagnosis not present

## 2020-07-29 DIAGNOSIS — J9610 Chronic respiratory failure, unspecified whether with hypoxia or hypercapnia: Secondary | ICD-10-CM | POA: Diagnosis not present

## 2020-07-29 DIAGNOSIS — I10 Essential (primary) hypertension: Secondary | ICD-10-CM | POA: Diagnosis not present

## 2020-07-31 DIAGNOSIS — I5032 Chronic diastolic (congestive) heart failure: Secondary | ICD-10-CM | POA: Diagnosis not present

## 2020-07-31 DIAGNOSIS — F419 Anxiety disorder, unspecified: Secondary | ICD-10-CM | POA: Diagnosis not present

## 2020-07-31 DIAGNOSIS — I25119 Atherosclerotic heart disease of native coronary artery with unspecified angina pectoris: Secondary | ICD-10-CM | POA: Diagnosis not present

## 2020-07-31 DIAGNOSIS — J9611 Chronic respiratory failure with hypoxia: Secondary | ICD-10-CM | POA: Diagnosis not present

## 2020-07-31 DIAGNOSIS — F0150 Vascular dementia without behavioral disturbance: Secondary | ICD-10-CM | POA: Diagnosis not present

## 2020-07-31 DIAGNOSIS — I679 Cerebrovascular disease, unspecified: Secondary | ICD-10-CM | POA: Diagnosis not present

## 2020-08-02 DIAGNOSIS — J9611 Chronic respiratory failure with hypoxia: Secondary | ICD-10-CM | POA: Diagnosis not present

## 2020-08-02 DIAGNOSIS — F419 Anxiety disorder, unspecified: Secondary | ICD-10-CM | POA: Diagnosis not present

## 2020-08-02 DIAGNOSIS — I5032 Chronic diastolic (congestive) heart failure: Secondary | ICD-10-CM | POA: Diagnosis not present

## 2020-08-02 DIAGNOSIS — I679 Cerebrovascular disease, unspecified: Secondary | ICD-10-CM | POA: Diagnosis not present

## 2020-08-02 DIAGNOSIS — I25119 Atherosclerotic heart disease of native coronary artery with unspecified angina pectoris: Secondary | ICD-10-CM | POA: Diagnosis not present

## 2020-08-02 DIAGNOSIS — F0150 Vascular dementia without behavioral disturbance: Secondary | ICD-10-CM | POA: Diagnosis not present

## 2020-08-05 DIAGNOSIS — F32A Depression, unspecified: Secondary | ICD-10-CM | POA: Diagnosis not present

## 2020-08-05 DIAGNOSIS — R600 Localized edema: Secondary | ICD-10-CM | POA: Diagnosis not present

## 2020-08-05 DIAGNOSIS — I2510 Atherosclerotic heart disease of native coronary artery without angina pectoris: Secondary | ICD-10-CM | POA: Diagnosis not present

## 2020-08-05 DIAGNOSIS — G4700 Insomnia, unspecified: Secondary | ICD-10-CM | POA: Diagnosis not present

## 2020-08-05 DIAGNOSIS — K5900 Constipation, unspecified: Secondary | ICD-10-CM | POA: Diagnosis not present

## 2020-08-05 DIAGNOSIS — E039 Hypothyroidism, unspecified: Secondary | ICD-10-CM | POA: Diagnosis not present

## 2020-08-05 DIAGNOSIS — I5032 Chronic diastolic (congestive) heart failure: Secondary | ICD-10-CM | POA: Diagnosis not present

## 2020-08-05 DIAGNOSIS — F0150 Vascular dementia without behavioral disturbance: Secondary | ICD-10-CM | POA: Diagnosis not present

## 2020-08-05 DIAGNOSIS — I10 Essential (primary) hypertension: Secondary | ICD-10-CM | POA: Diagnosis not present

## 2020-08-05 DIAGNOSIS — J9610 Chronic respiratory failure, unspecified whether with hypoxia or hypercapnia: Secondary | ICD-10-CM | POA: Diagnosis not present

## 2020-08-07 DIAGNOSIS — F419 Anxiety disorder, unspecified: Secondary | ICD-10-CM | POA: Diagnosis not present

## 2020-08-07 DIAGNOSIS — I679 Cerebrovascular disease, unspecified: Secondary | ICD-10-CM | POA: Diagnosis not present

## 2020-08-07 DIAGNOSIS — J9611 Chronic respiratory failure with hypoxia: Secondary | ICD-10-CM | POA: Diagnosis not present

## 2020-08-07 DIAGNOSIS — F0150 Vascular dementia without behavioral disturbance: Secondary | ICD-10-CM | POA: Diagnosis not present

## 2020-08-07 DIAGNOSIS — I5032 Chronic diastolic (congestive) heart failure: Secondary | ICD-10-CM | POA: Diagnosis not present

## 2020-08-07 DIAGNOSIS — I25119 Atherosclerotic heart disease of native coronary artery with unspecified angina pectoris: Secondary | ICD-10-CM | POA: Diagnosis not present

## 2020-08-09 DIAGNOSIS — I25119 Atherosclerotic heart disease of native coronary artery with unspecified angina pectoris: Secondary | ICD-10-CM | POA: Diagnosis not present

## 2020-08-09 DIAGNOSIS — J9611 Chronic respiratory failure with hypoxia: Secondary | ICD-10-CM | POA: Diagnosis not present

## 2020-08-09 DIAGNOSIS — F0150 Vascular dementia without behavioral disturbance: Secondary | ICD-10-CM | POA: Diagnosis not present

## 2020-08-09 DIAGNOSIS — I679 Cerebrovascular disease, unspecified: Secondary | ICD-10-CM | POA: Diagnosis not present

## 2020-08-09 DIAGNOSIS — F419 Anxiety disorder, unspecified: Secondary | ICD-10-CM | POA: Diagnosis not present

## 2020-08-09 DIAGNOSIS — I5032 Chronic diastolic (congestive) heart failure: Secondary | ICD-10-CM | POA: Diagnosis not present

## 2020-08-12 DIAGNOSIS — G4700 Insomnia, unspecified: Secondary | ICD-10-CM | POA: Diagnosis not present

## 2020-08-12 DIAGNOSIS — K5900 Constipation, unspecified: Secondary | ICD-10-CM | POA: Diagnosis not present

## 2020-08-12 DIAGNOSIS — E039 Hypothyroidism, unspecified: Secondary | ICD-10-CM | POA: Diagnosis not present

## 2020-08-12 DIAGNOSIS — R600 Localized edema: Secondary | ICD-10-CM | POA: Diagnosis not present

## 2020-08-12 DIAGNOSIS — J9610 Chronic respiratory failure, unspecified whether with hypoxia or hypercapnia: Secondary | ICD-10-CM | POA: Diagnosis not present

## 2020-08-12 DIAGNOSIS — I10 Essential (primary) hypertension: Secondary | ICD-10-CM | POA: Diagnosis not present

## 2020-08-12 DIAGNOSIS — I2510 Atherosclerotic heart disease of native coronary artery without angina pectoris: Secondary | ICD-10-CM | POA: Diagnosis not present

## 2020-08-14 DIAGNOSIS — F0150 Vascular dementia without behavioral disturbance: Secondary | ICD-10-CM | POA: Diagnosis not present

## 2020-08-14 DIAGNOSIS — I5032 Chronic diastolic (congestive) heart failure: Secondary | ICD-10-CM | POA: Diagnosis not present

## 2020-08-14 DIAGNOSIS — F419 Anxiety disorder, unspecified: Secondary | ICD-10-CM | POA: Diagnosis not present

## 2020-08-14 DIAGNOSIS — I679 Cerebrovascular disease, unspecified: Secondary | ICD-10-CM | POA: Diagnosis not present

## 2020-08-14 DIAGNOSIS — I25119 Atherosclerotic heart disease of native coronary artery with unspecified angina pectoris: Secondary | ICD-10-CM | POA: Diagnosis not present

## 2020-08-14 DIAGNOSIS — J9611 Chronic respiratory failure with hypoxia: Secondary | ICD-10-CM | POA: Diagnosis not present

## 2020-08-16 DIAGNOSIS — J9611 Chronic respiratory failure with hypoxia: Secondary | ICD-10-CM | POA: Diagnosis not present

## 2020-08-16 DIAGNOSIS — F0150 Vascular dementia without behavioral disturbance: Secondary | ICD-10-CM | POA: Diagnosis not present

## 2020-08-16 DIAGNOSIS — I25119 Atherosclerotic heart disease of native coronary artery with unspecified angina pectoris: Secondary | ICD-10-CM | POA: Diagnosis not present

## 2020-08-16 DIAGNOSIS — I679 Cerebrovascular disease, unspecified: Secondary | ICD-10-CM | POA: Diagnosis not present

## 2020-08-16 DIAGNOSIS — I5032 Chronic diastolic (congestive) heart failure: Secondary | ICD-10-CM | POA: Diagnosis not present

## 2020-08-16 DIAGNOSIS — F419 Anxiety disorder, unspecified: Secondary | ICD-10-CM | POA: Diagnosis not present

## 2020-08-20 DIAGNOSIS — J9611 Chronic respiratory failure with hypoxia: Secondary | ICD-10-CM | POA: Diagnosis not present

## 2020-08-20 DIAGNOSIS — F0150 Vascular dementia without behavioral disturbance: Secondary | ICD-10-CM | POA: Diagnosis not present

## 2020-08-20 DIAGNOSIS — I5032 Chronic diastolic (congestive) heart failure: Secondary | ICD-10-CM | POA: Diagnosis not present

## 2020-08-20 DIAGNOSIS — I679 Cerebrovascular disease, unspecified: Secondary | ICD-10-CM | POA: Diagnosis not present

## 2020-08-20 DIAGNOSIS — I25119 Atherosclerotic heart disease of native coronary artery with unspecified angina pectoris: Secondary | ICD-10-CM | POA: Diagnosis not present

## 2020-08-20 DIAGNOSIS — F419 Anxiety disorder, unspecified: Secondary | ICD-10-CM | POA: Diagnosis not present

## 2020-08-22 DIAGNOSIS — J9610 Chronic respiratory failure, unspecified whether with hypoxia or hypercapnia: Secondary | ICD-10-CM | POA: Diagnosis not present

## 2020-08-22 DIAGNOSIS — F419 Anxiety disorder, unspecified: Secondary | ICD-10-CM | POA: Diagnosis not present

## 2020-08-22 DIAGNOSIS — I5032 Chronic diastolic (congestive) heart failure: Secondary | ICD-10-CM | POA: Diagnosis not present

## 2020-08-22 DIAGNOSIS — K5900 Constipation, unspecified: Secondary | ICD-10-CM | POA: Diagnosis not present

## 2020-08-22 DIAGNOSIS — G4700 Insomnia, unspecified: Secondary | ICD-10-CM | POA: Diagnosis not present

## 2020-08-22 DIAGNOSIS — F0150 Vascular dementia without behavioral disturbance: Secondary | ICD-10-CM | POA: Diagnosis not present

## 2020-08-22 DIAGNOSIS — I25119 Atherosclerotic heart disease of native coronary artery with unspecified angina pectoris: Secondary | ICD-10-CM | POA: Diagnosis not present

## 2020-08-22 DIAGNOSIS — I10 Essential (primary) hypertension: Secondary | ICD-10-CM | POA: Diagnosis not present

## 2020-08-22 DIAGNOSIS — J9611 Chronic respiratory failure with hypoxia: Secondary | ICD-10-CM | POA: Diagnosis not present

## 2020-08-22 DIAGNOSIS — E039 Hypothyroidism, unspecified: Secondary | ICD-10-CM | POA: Diagnosis not present

## 2020-08-22 DIAGNOSIS — I679 Cerebrovascular disease, unspecified: Secondary | ICD-10-CM | POA: Diagnosis not present

## 2020-08-22 DIAGNOSIS — I2510 Atherosclerotic heart disease of native coronary artery without angina pectoris: Secondary | ICD-10-CM | POA: Diagnosis not present

## 2020-08-23 DIAGNOSIS — I77810 Thoracic aortic ectasia: Secondary | ICD-10-CM | POA: Diagnosis not present

## 2020-08-23 DIAGNOSIS — R159 Full incontinence of feces: Secondary | ICD-10-CM | POA: Diagnosis not present

## 2020-08-23 DIAGNOSIS — F0150 Vascular dementia without behavioral disturbance: Secondary | ICD-10-CM | POA: Diagnosis not present

## 2020-08-23 DIAGNOSIS — Z8616 Personal history of COVID-19: Secondary | ICD-10-CM | POA: Diagnosis not present

## 2020-08-23 DIAGNOSIS — Z8781 Personal history of (healed) traumatic fracture: Secondary | ICD-10-CM | POA: Diagnosis not present

## 2020-08-23 DIAGNOSIS — E039 Hypothyroidism, unspecified: Secondary | ICD-10-CM | POA: Diagnosis not present

## 2020-08-23 DIAGNOSIS — H04129 Dry eye syndrome of unspecified lacrimal gland: Secondary | ICD-10-CM | POA: Diagnosis not present

## 2020-08-23 DIAGNOSIS — Z87440 Personal history of urinary (tract) infections: Secondary | ICD-10-CM | POA: Diagnosis not present

## 2020-08-23 DIAGNOSIS — Z8673 Personal history of transient ischemic attack (TIA), and cerebral infarction without residual deficits: Secondary | ICD-10-CM | POA: Diagnosis not present

## 2020-08-23 DIAGNOSIS — Z741 Need for assistance with personal care: Secondary | ICD-10-CM | POA: Diagnosis not present

## 2020-08-23 DIAGNOSIS — I25119 Atherosclerotic heart disease of native coronary artery with unspecified angina pectoris: Secondary | ICD-10-CM | POA: Diagnosis not present

## 2020-08-23 DIAGNOSIS — I679 Cerebrovascular disease, unspecified: Secondary | ICD-10-CM | POA: Diagnosis not present

## 2020-08-23 DIAGNOSIS — R32 Unspecified urinary incontinence: Secondary | ICD-10-CM | POA: Diagnosis not present

## 2020-08-23 DIAGNOSIS — J9611 Chronic respiratory failure with hypoxia: Secondary | ICD-10-CM | POA: Diagnosis not present

## 2020-08-23 DIAGNOSIS — Z6824 Body mass index (BMI) 24.0-24.9, adult: Secondary | ICD-10-CM | POA: Diagnosis not present

## 2020-08-23 DIAGNOSIS — Z872 Personal history of diseases of the skin and subcutaneous tissue: Secondary | ICD-10-CM | POA: Diagnosis not present

## 2020-08-23 DIAGNOSIS — J449 Chronic obstructive pulmonary disease, unspecified: Secondary | ICD-10-CM | POA: Diagnosis not present

## 2020-08-23 DIAGNOSIS — I5032 Chronic diastolic (congestive) heart failure: Secondary | ICD-10-CM | POA: Diagnosis not present

## 2020-08-23 DIAGNOSIS — Z9981 Dependence on supplemental oxygen: Secondary | ICD-10-CM | POA: Diagnosis not present

## 2020-08-23 DIAGNOSIS — E440 Moderate protein-calorie malnutrition: Secondary | ICD-10-CM | POA: Diagnosis not present

## 2020-08-23 DIAGNOSIS — F419 Anxiety disorder, unspecified: Secondary | ICD-10-CM | POA: Diagnosis not present

## 2020-08-26 DIAGNOSIS — J9611 Chronic respiratory failure with hypoxia: Secondary | ICD-10-CM | POA: Diagnosis not present

## 2020-08-26 DIAGNOSIS — F0150 Vascular dementia without behavioral disturbance: Secondary | ICD-10-CM | POA: Diagnosis not present

## 2020-08-26 DIAGNOSIS — I25119 Atherosclerotic heart disease of native coronary artery with unspecified angina pectoris: Secondary | ICD-10-CM | POA: Diagnosis not present

## 2020-08-26 DIAGNOSIS — I679 Cerebrovascular disease, unspecified: Secondary | ICD-10-CM | POA: Diagnosis not present

## 2020-08-26 DIAGNOSIS — F419 Anxiety disorder, unspecified: Secondary | ICD-10-CM | POA: Diagnosis not present

## 2020-08-26 DIAGNOSIS — I5032 Chronic diastolic (congestive) heart failure: Secondary | ICD-10-CM | POA: Diagnosis not present

## 2020-08-27 DIAGNOSIS — J9611 Chronic respiratory failure with hypoxia: Secondary | ICD-10-CM | POA: Diagnosis not present

## 2020-08-27 DIAGNOSIS — F0150 Vascular dementia without behavioral disturbance: Secondary | ICD-10-CM | POA: Diagnosis not present

## 2020-08-27 DIAGNOSIS — I25119 Atherosclerotic heart disease of native coronary artery with unspecified angina pectoris: Secondary | ICD-10-CM | POA: Diagnosis not present

## 2020-08-27 DIAGNOSIS — I679 Cerebrovascular disease, unspecified: Secondary | ICD-10-CM | POA: Diagnosis not present

## 2020-08-27 DIAGNOSIS — F419 Anxiety disorder, unspecified: Secondary | ICD-10-CM | POA: Diagnosis not present

## 2020-08-27 DIAGNOSIS — I5032 Chronic diastolic (congestive) heart failure: Secondary | ICD-10-CM | POA: Diagnosis not present

## 2020-08-29 DIAGNOSIS — M79675 Pain in left toe(s): Secondary | ICD-10-CM | POA: Diagnosis not present

## 2020-08-29 DIAGNOSIS — I5032 Chronic diastolic (congestive) heart failure: Secondary | ICD-10-CM | POA: Diagnosis not present

## 2020-08-29 DIAGNOSIS — J9611 Chronic respiratory failure with hypoxia: Secondary | ICD-10-CM | POA: Diagnosis not present

## 2020-08-29 DIAGNOSIS — I679 Cerebrovascular disease, unspecified: Secondary | ICD-10-CM | POA: Diagnosis not present

## 2020-08-29 DIAGNOSIS — R600 Localized edema: Secondary | ICD-10-CM | POA: Diagnosis not present

## 2020-08-29 DIAGNOSIS — F0150 Vascular dementia without behavioral disturbance: Secondary | ICD-10-CM | POA: Diagnosis not present

## 2020-08-29 DIAGNOSIS — L97421 Non-pressure chronic ulcer of left heel and midfoot limited to breakdown of skin: Secondary | ICD-10-CM | POA: Diagnosis not present

## 2020-08-29 DIAGNOSIS — B351 Tinea unguium: Secondary | ICD-10-CM | POA: Diagnosis not present

## 2020-08-29 DIAGNOSIS — F419 Anxiety disorder, unspecified: Secondary | ICD-10-CM | POA: Diagnosis not present

## 2020-08-29 DIAGNOSIS — I25119 Atherosclerotic heart disease of native coronary artery with unspecified angina pectoris: Secondary | ICD-10-CM | POA: Diagnosis not present

## 2020-09-02 DIAGNOSIS — F419 Anxiety disorder, unspecified: Secondary | ICD-10-CM | POA: Diagnosis not present

## 2020-09-02 DIAGNOSIS — I5032 Chronic diastolic (congestive) heart failure: Secondary | ICD-10-CM | POA: Diagnosis not present

## 2020-09-02 DIAGNOSIS — I679 Cerebrovascular disease, unspecified: Secondary | ICD-10-CM | POA: Diagnosis not present

## 2020-09-02 DIAGNOSIS — I25119 Atherosclerotic heart disease of native coronary artery with unspecified angina pectoris: Secondary | ICD-10-CM | POA: Diagnosis not present

## 2020-09-02 DIAGNOSIS — F0150 Vascular dementia without behavioral disturbance: Secondary | ICD-10-CM | POA: Diagnosis not present

## 2020-09-02 DIAGNOSIS — J9611 Chronic respiratory failure with hypoxia: Secondary | ICD-10-CM | POA: Diagnosis not present

## 2020-09-03 DIAGNOSIS — F419 Anxiety disorder, unspecified: Secondary | ICD-10-CM | POA: Diagnosis not present

## 2020-09-03 DIAGNOSIS — I679 Cerebrovascular disease, unspecified: Secondary | ICD-10-CM | POA: Diagnosis not present

## 2020-09-03 DIAGNOSIS — I25119 Atherosclerotic heart disease of native coronary artery with unspecified angina pectoris: Secondary | ICD-10-CM | POA: Diagnosis not present

## 2020-09-03 DIAGNOSIS — I5032 Chronic diastolic (congestive) heart failure: Secondary | ICD-10-CM | POA: Diagnosis not present

## 2020-09-03 DIAGNOSIS — J9611 Chronic respiratory failure with hypoxia: Secondary | ICD-10-CM | POA: Diagnosis not present

## 2020-09-03 DIAGNOSIS — F0150 Vascular dementia without behavioral disturbance: Secondary | ICD-10-CM | POA: Diagnosis not present

## 2020-09-05 DIAGNOSIS — J9611 Chronic respiratory failure with hypoxia: Secondary | ICD-10-CM | POA: Diagnosis not present

## 2020-09-05 DIAGNOSIS — I679 Cerebrovascular disease, unspecified: Secondary | ICD-10-CM | POA: Diagnosis not present

## 2020-09-05 DIAGNOSIS — F0150 Vascular dementia without behavioral disturbance: Secondary | ICD-10-CM | POA: Diagnosis not present

## 2020-09-05 DIAGNOSIS — I5032 Chronic diastolic (congestive) heart failure: Secondary | ICD-10-CM | POA: Diagnosis not present

## 2020-09-05 DIAGNOSIS — F419 Anxiety disorder, unspecified: Secondary | ICD-10-CM | POA: Diagnosis not present

## 2020-09-05 DIAGNOSIS — I25119 Atherosclerotic heart disease of native coronary artery with unspecified angina pectoris: Secondary | ICD-10-CM | POA: Diagnosis not present

## 2020-09-09 DIAGNOSIS — I10 Essential (primary) hypertension: Secondary | ICD-10-CM | POA: Diagnosis not present

## 2020-09-09 DIAGNOSIS — G4700 Insomnia, unspecified: Secondary | ICD-10-CM | POA: Diagnosis not present

## 2020-09-09 DIAGNOSIS — E039 Hypothyroidism, unspecified: Secondary | ICD-10-CM | POA: Diagnosis not present

## 2020-09-09 DIAGNOSIS — F0150 Vascular dementia without behavioral disturbance: Secondary | ICD-10-CM | POA: Diagnosis not present

## 2020-09-09 DIAGNOSIS — J9610 Chronic respiratory failure, unspecified whether with hypoxia or hypercapnia: Secondary | ICD-10-CM | POA: Diagnosis not present

## 2020-09-09 DIAGNOSIS — R600 Localized edema: Secondary | ICD-10-CM | POA: Diagnosis not present

## 2020-09-09 DIAGNOSIS — I5032 Chronic diastolic (congestive) heart failure: Secondary | ICD-10-CM | POA: Diagnosis not present

## 2020-09-09 DIAGNOSIS — F32A Depression, unspecified: Secondary | ICD-10-CM | POA: Diagnosis not present

## 2020-09-09 DIAGNOSIS — I2510 Atherosclerotic heart disease of native coronary artery without angina pectoris: Secondary | ICD-10-CM | POA: Diagnosis not present

## 2020-09-09 DIAGNOSIS — K5900 Constipation, unspecified: Secondary | ICD-10-CM | POA: Diagnosis not present

## 2020-09-10 DIAGNOSIS — F0150 Vascular dementia without behavioral disturbance: Secondary | ICD-10-CM | POA: Diagnosis not present

## 2020-09-10 DIAGNOSIS — I5032 Chronic diastolic (congestive) heart failure: Secondary | ICD-10-CM | POA: Diagnosis not present

## 2020-09-10 DIAGNOSIS — F419 Anxiety disorder, unspecified: Secondary | ICD-10-CM | POA: Diagnosis not present

## 2020-09-10 DIAGNOSIS — J9611 Chronic respiratory failure with hypoxia: Secondary | ICD-10-CM | POA: Diagnosis not present

## 2020-09-10 DIAGNOSIS — I25119 Atherosclerotic heart disease of native coronary artery with unspecified angina pectoris: Secondary | ICD-10-CM | POA: Diagnosis not present

## 2020-09-10 DIAGNOSIS — I679 Cerebrovascular disease, unspecified: Secondary | ICD-10-CM | POA: Diagnosis not present

## 2020-09-12 DIAGNOSIS — F0150 Vascular dementia without behavioral disturbance: Secondary | ICD-10-CM | POA: Diagnosis not present

## 2020-09-12 DIAGNOSIS — I5032 Chronic diastolic (congestive) heart failure: Secondary | ICD-10-CM | POA: Diagnosis not present

## 2020-09-12 DIAGNOSIS — I679 Cerebrovascular disease, unspecified: Secondary | ICD-10-CM | POA: Diagnosis not present

## 2020-09-12 DIAGNOSIS — F419 Anxiety disorder, unspecified: Secondary | ICD-10-CM | POA: Diagnosis not present

## 2020-09-12 DIAGNOSIS — J9611 Chronic respiratory failure with hypoxia: Secondary | ICD-10-CM | POA: Diagnosis not present

## 2020-09-12 DIAGNOSIS — I25119 Atherosclerotic heart disease of native coronary artery with unspecified angina pectoris: Secondary | ICD-10-CM | POA: Diagnosis not present

## 2020-09-16 DIAGNOSIS — F419 Anxiety disorder, unspecified: Secondary | ICD-10-CM | POA: Diagnosis not present

## 2020-09-16 DIAGNOSIS — I25119 Atherosclerotic heart disease of native coronary artery with unspecified angina pectoris: Secondary | ICD-10-CM | POA: Diagnosis not present

## 2020-09-16 DIAGNOSIS — I679 Cerebrovascular disease, unspecified: Secondary | ICD-10-CM | POA: Diagnosis not present

## 2020-09-16 DIAGNOSIS — J9611 Chronic respiratory failure with hypoxia: Secondary | ICD-10-CM | POA: Diagnosis not present

## 2020-09-16 DIAGNOSIS — I5032 Chronic diastolic (congestive) heart failure: Secondary | ICD-10-CM | POA: Diagnosis not present

## 2020-09-16 DIAGNOSIS — F0150 Vascular dementia without behavioral disturbance: Secondary | ICD-10-CM | POA: Diagnosis not present

## 2020-09-17 DIAGNOSIS — F0150 Vascular dementia without behavioral disturbance: Secondary | ICD-10-CM | POA: Diagnosis not present

## 2020-09-17 DIAGNOSIS — I25119 Atherosclerotic heart disease of native coronary artery with unspecified angina pectoris: Secondary | ICD-10-CM | POA: Diagnosis not present

## 2020-09-17 DIAGNOSIS — I5032 Chronic diastolic (congestive) heart failure: Secondary | ICD-10-CM | POA: Diagnosis not present

## 2020-09-17 DIAGNOSIS — J9611 Chronic respiratory failure with hypoxia: Secondary | ICD-10-CM | POA: Diagnosis not present

## 2020-09-17 DIAGNOSIS — I679 Cerebrovascular disease, unspecified: Secondary | ICD-10-CM | POA: Diagnosis not present

## 2020-09-17 DIAGNOSIS — F419 Anxiety disorder, unspecified: Secondary | ICD-10-CM | POA: Diagnosis not present

## 2020-09-19 DIAGNOSIS — I25119 Atherosclerotic heart disease of native coronary artery with unspecified angina pectoris: Secondary | ICD-10-CM | POA: Diagnosis not present

## 2020-09-19 DIAGNOSIS — F419 Anxiety disorder, unspecified: Secondary | ICD-10-CM | POA: Diagnosis not present

## 2020-09-19 DIAGNOSIS — I5032 Chronic diastolic (congestive) heart failure: Secondary | ICD-10-CM | POA: Diagnosis not present

## 2020-09-19 DIAGNOSIS — F0150 Vascular dementia without behavioral disturbance: Secondary | ICD-10-CM | POA: Diagnosis not present

## 2020-09-19 DIAGNOSIS — I679 Cerebrovascular disease, unspecified: Secondary | ICD-10-CM | POA: Diagnosis not present

## 2020-09-19 DIAGNOSIS — J9611 Chronic respiratory failure with hypoxia: Secondary | ICD-10-CM | POA: Diagnosis not present

## 2020-09-20 DIAGNOSIS — I679 Cerebrovascular disease, unspecified: Secondary | ICD-10-CM | POA: Diagnosis not present

## 2020-09-20 DIAGNOSIS — J9611 Chronic respiratory failure with hypoxia: Secondary | ICD-10-CM | POA: Diagnosis not present

## 2020-09-20 DIAGNOSIS — I25119 Atherosclerotic heart disease of native coronary artery with unspecified angina pectoris: Secondary | ICD-10-CM | POA: Diagnosis not present

## 2020-09-20 DIAGNOSIS — F419 Anxiety disorder, unspecified: Secondary | ICD-10-CM | POA: Diagnosis not present

## 2020-09-20 DIAGNOSIS — F0150 Vascular dementia without behavioral disturbance: Secondary | ICD-10-CM | POA: Diagnosis not present

## 2020-09-20 DIAGNOSIS — I5032 Chronic diastolic (congestive) heart failure: Secondary | ICD-10-CM | POA: Diagnosis not present

## 2020-09-22 DIAGNOSIS — F419 Anxiety disorder, unspecified: Secondary | ICD-10-CM | POA: Diagnosis not present

## 2020-09-22 DIAGNOSIS — J9611 Chronic respiratory failure with hypoxia: Secondary | ICD-10-CM | POA: Diagnosis not present

## 2020-09-22 DIAGNOSIS — I679 Cerebrovascular disease, unspecified: Secondary | ICD-10-CM | POA: Diagnosis not present

## 2020-09-22 DIAGNOSIS — Z87440 Personal history of urinary (tract) infections: Secondary | ICD-10-CM | POA: Diagnosis not present

## 2020-09-22 DIAGNOSIS — E039 Hypothyroidism, unspecified: Secondary | ICD-10-CM | POA: Diagnosis not present

## 2020-09-22 DIAGNOSIS — Z6824 Body mass index (BMI) 24.0-24.9, adult: Secondary | ICD-10-CM | POA: Diagnosis not present

## 2020-09-22 DIAGNOSIS — E440 Moderate protein-calorie malnutrition: Secondary | ICD-10-CM | POA: Diagnosis not present

## 2020-09-22 DIAGNOSIS — Z9981 Dependence on supplemental oxygen: Secondary | ICD-10-CM | POA: Diagnosis not present

## 2020-09-22 DIAGNOSIS — J449 Chronic obstructive pulmonary disease, unspecified: Secondary | ICD-10-CM | POA: Diagnosis not present

## 2020-09-22 DIAGNOSIS — I25119 Atherosclerotic heart disease of native coronary artery with unspecified angina pectoris: Secondary | ICD-10-CM | POA: Diagnosis not present

## 2020-09-22 DIAGNOSIS — I5032 Chronic diastolic (congestive) heart failure: Secondary | ICD-10-CM | POA: Diagnosis not present

## 2020-09-22 DIAGNOSIS — Z8616 Personal history of COVID-19: Secondary | ICD-10-CM | POA: Diagnosis not present

## 2020-09-22 DIAGNOSIS — R159 Full incontinence of feces: Secondary | ICD-10-CM | POA: Diagnosis not present

## 2020-09-22 DIAGNOSIS — Z8781 Personal history of (healed) traumatic fracture: Secondary | ICD-10-CM | POA: Diagnosis not present

## 2020-09-22 DIAGNOSIS — R32 Unspecified urinary incontinence: Secondary | ICD-10-CM | POA: Diagnosis not present

## 2020-09-22 DIAGNOSIS — Z741 Need for assistance with personal care: Secondary | ICD-10-CM | POA: Diagnosis not present

## 2020-09-22 DIAGNOSIS — I77810 Thoracic aortic ectasia: Secondary | ICD-10-CM | POA: Diagnosis not present

## 2020-09-22 DIAGNOSIS — Z872 Personal history of diseases of the skin and subcutaneous tissue: Secondary | ICD-10-CM | POA: Diagnosis not present

## 2020-09-22 DIAGNOSIS — H04129 Dry eye syndrome of unspecified lacrimal gland: Secondary | ICD-10-CM | POA: Diagnosis not present

## 2020-09-22 DIAGNOSIS — F0150 Vascular dementia without behavioral disturbance: Secondary | ICD-10-CM | POA: Diagnosis not present

## 2020-09-22 DIAGNOSIS — Z8673 Personal history of transient ischemic attack (TIA), and cerebral infarction without residual deficits: Secondary | ICD-10-CM | POA: Diagnosis not present

## 2020-09-24 DIAGNOSIS — F419 Anxiety disorder, unspecified: Secondary | ICD-10-CM | POA: Diagnosis not present

## 2020-09-24 DIAGNOSIS — I5032 Chronic diastolic (congestive) heart failure: Secondary | ICD-10-CM | POA: Diagnosis not present

## 2020-09-24 DIAGNOSIS — F0150 Vascular dementia without behavioral disturbance: Secondary | ICD-10-CM | POA: Diagnosis not present

## 2020-09-24 DIAGNOSIS — J9611 Chronic respiratory failure with hypoxia: Secondary | ICD-10-CM | POA: Diagnosis not present

## 2020-09-24 DIAGNOSIS — I679 Cerebrovascular disease, unspecified: Secondary | ICD-10-CM | POA: Diagnosis not present

## 2020-09-24 DIAGNOSIS — I25119 Atherosclerotic heart disease of native coronary artery with unspecified angina pectoris: Secondary | ICD-10-CM | POA: Diagnosis not present

## 2020-09-25 DIAGNOSIS — I5032 Chronic diastolic (congestive) heart failure: Secondary | ICD-10-CM | POA: Diagnosis not present

## 2020-09-25 DIAGNOSIS — F419 Anxiety disorder, unspecified: Secondary | ICD-10-CM | POA: Diagnosis not present

## 2020-09-25 DIAGNOSIS — J9611 Chronic respiratory failure with hypoxia: Secondary | ICD-10-CM | POA: Diagnosis not present

## 2020-09-25 DIAGNOSIS — I679 Cerebrovascular disease, unspecified: Secondary | ICD-10-CM | POA: Diagnosis not present

## 2020-09-25 DIAGNOSIS — F0150 Vascular dementia without behavioral disturbance: Secondary | ICD-10-CM | POA: Diagnosis not present

## 2020-09-25 DIAGNOSIS — I25119 Atherosclerotic heart disease of native coronary artery with unspecified angina pectoris: Secondary | ICD-10-CM | POA: Diagnosis not present

## 2020-09-26 DIAGNOSIS — J9611 Chronic respiratory failure with hypoxia: Secondary | ICD-10-CM | POA: Diagnosis not present

## 2020-09-26 DIAGNOSIS — F0150 Vascular dementia without behavioral disturbance: Secondary | ICD-10-CM | POA: Diagnosis not present

## 2020-09-26 DIAGNOSIS — I679 Cerebrovascular disease, unspecified: Secondary | ICD-10-CM | POA: Diagnosis not present

## 2020-09-26 DIAGNOSIS — F419 Anxiety disorder, unspecified: Secondary | ICD-10-CM | POA: Diagnosis not present

## 2020-09-26 DIAGNOSIS — I5032 Chronic diastolic (congestive) heart failure: Secondary | ICD-10-CM | POA: Diagnosis not present

## 2020-09-26 DIAGNOSIS — I25119 Atherosclerotic heart disease of native coronary artery with unspecified angina pectoris: Secondary | ICD-10-CM | POA: Diagnosis not present

## 2020-10-01 DIAGNOSIS — I679 Cerebrovascular disease, unspecified: Secondary | ICD-10-CM | POA: Diagnosis not present

## 2020-10-01 DIAGNOSIS — J9611 Chronic respiratory failure with hypoxia: Secondary | ICD-10-CM | POA: Diagnosis not present

## 2020-10-01 DIAGNOSIS — I25119 Atherosclerotic heart disease of native coronary artery with unspecified angina pectoris: Secondary | ICD-10-CM | POA: Diagnosis not present

## 2020-10-01 DIAGNOSIS — F419 Anxiety disorder, unspecified: Secondary | ICD-10-CM | POA: Diagnosis not present

## 2020-10-01 DIAGNOSIS — F0150 Vascular dementia without behavioral disturbance: Secondary | ICD-10-CM | POA: Diagnosis not present

## 2020-10-01 DIAGNOSIS — I5032 Chronic diastolic (congestive) heart failure: Secondary | ICD-10-CM | POA: Diagnosis not present

## 2020-10-03 DIAGNOSIS — J9610 Chronic respiratory failure, unspecified whether with hypoxia or hypercapnia: Secondary | ICD-10-CM | POA: Diagnosis not present

## 2020-10-03 DIAGNOSIS — I2510 Atherosclerotic heart disease of native coronary artery without angina pectoris: Secondary | ICD-10-CM | POA: Diagnosis not present

## 2020-10-03 DIAGNOSIS — F32A Depression, unspecified: Secondary | ICD-10-CM | POA: Diagnosis not present

## 2020-10-03 DIAGNOSIS — E039 Hypothyroidism, unspecified: Secondary | ICD-10-CM | POA: Diagnosis not present

## 2020-10-03 DIAGNOSIS — R600 Localized edema: Secondary | ICD-10-CM | POA: Diagnosis not present

## 2020-10-03 DIAGNOSIS — I679 Cerebrovascular disease, unspecified: Secondary | ICD-10-CM | POA: Diagnosis not present

## 2020-10-03 DIAGNOSIS — F0150 Vascular dementia without behavioral disturbance: Secondary | ICD-10-CM | POA: Diagnosis not present

## 2020-10-03 DIAGNOSIS — F419 Anxiety disorder, unspecified: Secondary | ICD-10-CM | POA: Diagnosis not present

## 2020-10-03 DIAGNOSIS — I5032 Chronic diastolic (congestive) heart failure: Secondary | ICD-10-CM | POA: Diagnosis not present

## 2020-10-03 DIAGNOSIS — I25119 Atherosclerotic heart disease of native coronary artery with unspecified angina pectoris: Secondary | ICD-10-CM | POA: Diagnosis not present

## 2020-10-03 DIAGNOSIS — G4700 Insomnia, unspecified: Secondary | ICD-10-CM | POA: Diagnosis not present

## 2020-10-03 DIAGNOSIS — I10 Essential (primary) hypertension: Secondary | ICD-10-CM | POA: Diagnosis not present

## 2020-10-03 DIAGNOSIS — K5900 Constipation, unspecified: Secondary | ICD-10-CM | POA: Diagnosis not present

## 2020-10-03 DIAGNOSIS — J9611 Chronic respiratory failure with hypoxia: Secondary | ICD-10-CM | POA: Diagnosis not present

## 2020-10-07 DIAGNOSIS — I25119 Atherosclerotic heart disease of native coronary artery with unspecified angina pectoris: Secondary | ICD-10-CM | POA: Diagnosis not present

## 2020-10-07 DIAGNOSIS — R0789 Other chest pain: Secondary | ICD-10-CM | POA: Diagnosis not present

## 2020-10-07 DIAGNOSIS — J9611 Chronic respiratory failure with hypoxia: Secondary | ICD-10-CM | POA: Diagnosis not present

## 2020-10-07 DIAGNOSIS — F0150 Vascular dementia without behavioral disturbance: Secondary | ICD-10-CM | POA: Diagnosis not present

## 2020-10-07 DIAGNOSIS — G4700 Insomnia, unspecified: Secondary | ICD-10-CM | POA: Diagnosis not present

## 2020-10-07 DIAGNOSIS — J9610 Chronic respiratory failure, unspecified whether with hypoxia or hypercapnia: Secondary | ICD-10-CM | POA: Diagnosis not present

## 2020-10-07 DIAGNOSIS — I10 Essential (primary) hypertension: Secondary | ICD-10-CM | POA: Diagnosis not present

## 2020-10-07 DIAGNOSIS — E039 Hypothyroidism, unspecified: Secondary | ICD-10-CM | POA: Diagnosis not present

## 2020-10-07 DIAGNOSIS — I2510 Atherosclerotic heart disease of native coronary artery without angina pectoris: Secondary | ICD-10-CM | POA: Diagnosis not present

## 2020-10-07 DIAGNOSIS — I5032 Chronic diastolic (congestive) heart failure: Secondary | ICD-10-CM | POA: Diagnosis not present

## 2020-10-07 DIAGNOSIS — K5900 Constipation, unspecified: Secondary | ICD-10-CM | POA: Diagnosis not present

## 2020-10-07 DIAGNOSIS — I679 Cerebrovascular disease, unspecified: Secondary | ICD-10-CM | POA: Diagnosis not present

## 2020-10-07 DIAGNOSIS — F419 Anxiety disorder, unspecified: Secondary | ICD-10-CM | POA: Diagnosis not present

## 2020-10-08 DIAGNOSIS — I679 Cerebrovascular disease, unspecified: Secondary | ICD-10-CM | POA: Diagnosis not present

## 2020-10-08 DIAGNOSIS — J9611 Chronic respiratory failure with hypoxia: Secondary | ICD-10-CM | POA: Diagnosis not present

## 2020-10-08 DIAGNOSIS — F419 Anxiety disorder, unspecified: Secondary | ICD-10-CM | POA: Diagnosis not present

## 2020-10-08 DIAGNOSIS — F0150 Vascular dementia without behavioral disturbance: Secondary | ICD-10-CM | POA: Diagnosis not present

## 2020-10-08 DIAGNOSIS — I5032 Chronic diastolic (congestive) heart failure: Secondary | ICD-10-CM | POA: Diagnosis not present

## 2020-10-08 DIAGNOSIS — I25119 Atherosclerotic heart disease of native coronary artery with unspecified angina pectoris: Secondary | ICD-10-CM | POA: Diagnosis not present

## 2020-10-10 DIAGNOSIS — F419 Anxiety disorder, unspecified: Secondary | ICD-10-CM | POA: Diagnosis not present

## 2020-10-10 DIAGNOSIS — I679 Cerebrovascular disease, unspecified: Secondary | ICD-10-CM | POA: Diagnosis not present

## 2020-10-10 DIAGNOSIS — I25119 Atherosclerotic heart disease of native coronary artery with unspecified angina pectoris: Secondary | ICD-10-CM | POA: Diagnosis not present

## 2020-10-10 DIAGNOSIS — F0150 Vascular dementia without behavioral disturbance: Secondary | ICD-10-CM | POA: Diagnosis not present

## 2020-10-10 DIAGNOSIS — I5032 Chronic diastolic (congestive) heart failure: Secondary | ICD-10-CM | POA: Diagnosis not present

## 2020-10-10 DIAGNOSIS — J9611 Chronic respiratory failure with hypoxia: Secondary | ICD-10-CM | POA: Diagnosis not present

## 2020-10-15 DIAGNOSIS — I679 Cerebrovascular disease, unspecified: Secondary | ICD-10-CM | POA: Diagnosis not present

## 2020-10-15 DIAGNOSIS — F419 Anxiety disorder, unspecified: Secondary | ICD-10-CM | POA: Diagnosis not present

## 2020-10-15 DIAGNOSIS — I5032 Chronic diastolic (congestive) heart failure: Secondary | ICD-10-CM | POA: Diagnosis not present

## 2020-10-15 DIAGNOSIS — I25119 Atherosclerotic heart disease of native coronary artery with unspecified angina pectoris: Secondary | ICD-10-CM | POA: Diagnosis not present

## 2020-10-15 DIAGNOSIS — F0150 Vascular dementia without behavioral disturbance: Secondary | ICD-10-CM | POA: Diagnosis not present

## 2020-10-15 DIAGNOSIS — J9611 Chronic respiratory failure with hypoxia: Secondary | ICD-10-CM | POA: Diagnosis not present

## 2020-10-17 DIAGNOSIS — F0150 Vascular dementia without behavioral disturbance: Secondary | ICD-10-CM | POA: Diagnosis not present

## 2020-10-17 DIAGNOSIS — F419 Anxiety disorder, unspecified: Secondary | ICD-10-CM | POA: Diagnosis not present

## 2020-10-17 DIAGNOSIS — I5032 Chronic diastolic (congestive) heart failure: Secondary | ICD-10-CM | POA: Diagnosis not present

## 2020-10-17 DIAGNOSIS — I679 Cerebrovascular disease, unspecified: Secondary | ICD-10-CM | POA: Diagnosis not present

## 2020-10-17 DIAGNOSIS — I25119 Atherosclerotic heart disease of native coronary artery with unspecified angina pectoris: Secondary | ICD-10-CM | POA: Diagnosis not present

## 2020-10-17 DIAGNOSIS — J9611 Chronic respiratory failure with hypoxia: Secondary | ICD-10-CM | POA: Diagnosis not present

## 2020-10-22 DIAGNOSIS — I5032 Chronic diastolic (congestive) heart failure: Secondary | ICD-10-CM | POA: Diagnosis not present

## 2020-10-22 DIAGNOSIS — F419 Anxiety disorder, unspecified: Secondary | ICD-10-CM | POA: Diagnosis not present

## 2020-10-22 DIAGNOSIS — J9611 Chronic respiratory failure with hypoxia: Secondary | ICD-10-CM | POA: Diagnosis not present

## 2020-10-22 DIAGNOSIS — E039 Hypothyroidism, unspecified: Secondary | ICD-10-CM | POA: Diagnosis not present

## 2020-10-22 DIAGNOSIS — F0150 Vascular dementia without behavioral disturbance: Secondary | ICD-10-CM | POA: Diagnosis not present

## 2020-10-22 DIAGNOSIS — I25119 Atherosclerotic heart disease of native coronary artery with unspecified angina pectoris: Secondary | ICD-10-CM | POA: Diagnosis not present

## 2020-10-22 DIAGNOSIS — I10 Essential (primary) hypertension: Secondary | ICD-10-CM | POA: Diagnosis not present

## 2020-10-22 DIAGNOSIS — I2510 Atherosclerotic heart disease of native coronary artery without angina pectoris: Secondary | ICD-10-CM | POA: Diagnosis not present

## 2020-10-22 DIAGNOSIS — I679 Cerebrovascular disease, unspecified: Secondary | ICD-10-CM | POA: Diagnosis not present

## 2020-10-23 DIAGNOSIS — I25119 Atherosclerotic heart disease of native coronary artery with unspecified angina pectoris: Secondary | ICD-10-CM | POA: Diagnosis not present

## 2020-10-23 DIAGNOSIS — R32 Unspecified urinary incontinence: Secondary | ICD-10-CM | POA: Diagnosis not present

## 2020-10-23 DIAGNOSIS — J449 Chronic obstructive pulmonary disease, unspecified: Secondary | ICD-10-CM | POA: Diagnosis not present

## 2020-10-23 DIAGNOSIS — Z872 Personal history of diseases of the skin and subcutaneous tissue: Secondary | ICD-10-CM | POA: Diagnosis not present

## 2020-10-23 DIAGNOSIS — J9611 Chronic respiratory failure with hypoxia: Secondary | ICD-10-CM | POA: Diagnosis not present

## 2020-10-23 DIAGNOSIS — Z8781 Personal history of (healed) traumatic fracture: Secondary | ICD-10-CM | POA: Diagnosis not present

## 2020-10-23 DIAGNOSIS — Z6824 Body mass index (BMI) 24.0-24.9, adult: Secondary | ICD-10-CM | POA: Diagnosis not present

## 2020-10-23 DIAGNOSIS — I77810 Thoracic aortic ectasia: Secondary | ICD-10-CM | POA: Diagnosis not present

## 2020-10-23 DIAGNOSIS — Z741 Need for assistance with personal care: Secondary | ICD-10-CM | POA: Diagnosis not present

## 2020-10-23 DIAGNOSIS — F0150 Vascular dementia without behavioral disturbance: Secondary | ICD-10-CM | POA: Diagnosis not present

## 2020-10-23 DIAGNOSIS — Z9981 Dependence on supplemental oxygen: Secondary | ICD-10-CM | POA: Diagnosis not present

## 2020-10-23 DIAGNOSIS — Z87440 Personal history of urinary (tract) infections: Secondary | ICD-10-CM | POA: Diagnosis not present

## 2020-10-23 DIAGNOSIS — Z8616 Personal history of COVID-19: Secondary | ICD-10-CM | POA: Diagnosis not present

## 2020-10-23 DIAGNOSIS — E039 Hypothyroidism, unspecified: Secondary | ICD-10-CM | POA: Diagnosis not present

## 2020-10-23 DIAGNOSIS — I5032 Chronic diastolic (congestive) heart failure: Secondary | ICD-10-CM | POA: Diagnosis not present

## 2020-10-23 DIAGNOSIS — Z8673 Personal history of transient ischemic attack (TIA), and cerebral infarction without residual deficits: Secondary | ICD-10-CM | POA: Diagnosis not present

## 2020-10-23 DIAGNOSIS — I679 Cerebrovascular disease, unspecified: Secondary | ICD-10-CM | POA: Diagnosis not present

## 2020-10-23 DIAGNOSIS — F419 Anxiety disorder, unspecified: Secondary | ICD-10-CM | POA: Diagnosis not present

## 2020-10-23 DIAGNOSIS — E440 Moderate protein-calorie malnutrition: Secondary | ICD-10-CM | POA: Diagnosis not present

## 2020-10-23 DIAGNOSIS — H04129 Dry eye syndrome of unspecified lacrimal gland: Secondary | ICD-10-CM | POA: Diagnosis not present

## 2020-10-23 DIAGNOSIS — L89622 Pressure ulcer of left heel, stage 2: Secondary | ICD-10-CM | POA: Diagnosis not present

## 2020-10-23 DIAGNOSIS — R159 Full incontinence of feces: Secondary | ICD-10-CM | POA: Diagnosis not present

## 2020-10-24 DIAGNOSIS — F419 Anxiety disorder, unspecified: Secondary | ICD-10-CM | POA: Diagnosis not present

## 2020-10-24 DIAGNOSIS — F0150 Vascular dementia without behavioral disturbance: Secondary | ICD-10-CM | POA: Diagnosis not present

## 2020-10-24 DIAGNOSIS — I5032 Chronic diastolic (congestive) heart failure: Secondary | ICD-10-CM | POA: Diagnosis not present

## 2020-10-24 DIAGNOSIS — I25119 Atherosclerotic heart disease of native coronary artery with unspecified angina pectoris: Secondary | ICD-10-CM | POA: Diagnosis not present

## 2020-10-24 DIAGNOSIS — J9611 Chronic respiratory failure with hypoxia: Secondary | ICD-10-CM | POA: Diagnosis not present

## 2020-10-24 DIAGNOSIS — I679 Cerebrovascular disease, unspecified: Secondary | ICD-10-CM | POA: Diagnosis not present

## 2020-10-25 DIAGNOSIS — J9611 Chronic respiratory failure with hypoxia: Secondary | ICD-10-CM | POA: Diagnosis not present

## 2020-10-25 DIAGNOSIS — F0150 Vascular dementia without behavioral disturbance: Secondary | ICD-10-CM | POA: Diagnosis not present

## 2020-10-25 DIAGNOSIS — I25119 Atherosclerotic heart disease of native coronary artery with unspecified angina pectoris: Secondary | ICD-10-CM | POA: Diagnosis not present

## 2020-10-25 DIAGNOSIS — I679 Cerebrovascular disease, unspecified: Secondary | ICD-10-CM | POA: Diagnosis not present

## 2020-10-25 DIAGNOSIS — I5032 Chronic diastolic (congestive) heart failure: Secondary | ICD-10-CM | POA: Diagnosis not present

## 2020-10-25 DIAGNOSIS — F419 Anxiety disorder, unspecified: Secondary | ICD-10-CM | POA: Diagnosis not present

## 2020-10-29 DIAGNOSIS — I679 Cerebrovascular disease, unspecified: Secondary | ICD-10-CM | POA: Diagnosis not present

## 2020-10-29 DIAGNOSIS — I5032 Chronic diastolic (congestive) heart failure: Secondary | ICD-10-CM | POA: Diagnosis not present

## 2020-10-29 DIAGNOSIS — F419 Anxiety disorder, unspecified: Secondary | ICD-10-CM | POA: Diagnosis not present

## 2020-10-29 DIAGNOSIS — J9611 Chronic respiratory failure with hypoxia: Secondary | ICD-10-CM | POA: Diagnosis not present

## 2020-10-29 DIAGNOSIS — F0150 Vascular dementia without behavioral disturbance: Secondary | ICD-10-CM | POA: Diagnosis not present

## 2020-10-29 DIAGNOSIS — I25119 Atherosclerotic heart disease of native coronary artery with unspecified angina pectoris: Secondary | ICD-10-CM | POA: Diagnosis not present

## 2020-10-31 DIAGNOSIS — F0150 Vascular dementia without behavioral disturbance: Secondary | ICD-10-CM | POA: Diagnosis not present

## 2020-10-31 DIAGNOSIS — F419 Anxiety disorder, unspecified: Secondary | ICD-10-CM | POA: Diagnosis not present

## 2020-10-31 DIAGNOSIS — I2510 Atherosclerotic heart disease of native coronary artery without angina pectoris: Secondary | ICD-10-CM | POA: Diagnosis not present

## 2020-10-31 DIAGNOSIS — J9611 Chronic respiratory failure with hypoxia: Secondary | ICD-10-CM | POA: Diagnosis not present

## 2020-10-31 DIAGNOSIS — R600 Localized edema: Secondary | ICD-10-CM | POA: Diagnosis not present

## 2020-10-31 DIAGNOSIS — K5900 Constipation, unspecified: Secondary | ICD-10-CM | POA: Diagnosis not present

## 2020-10-31 DIAGNOSIS — G4700 Insomnia, unspecified: Secondary | ICD-10-CM | POA: Diagnosis not present

## 2020-10-31 DIAGNOSIS — I5032 Chronic diastolic (congestive) heart failure: Secondary | ICD-10-CM | POA: Diagnosis not present

## 2020-10-31 DIAGNOSIS — I679 Cerebrovascular disease, unspecified: Secondary | ICD-10-CM | POA: Diagnosis not present

## 2020-10-31 DIAGNOSIS — I25119 Atherosclerotic heart disease of native coronary artery with unspecified angina pectoris: Secondary | ICD-10-CM | POA: Diagnosis not present

## 2020-10-31 DIAGNOSIS — R0789 Other chest pain: Secondary | ICD-10-CM | POA: Diagnosis not present

## 2020-11-04 DIAGNOSIS — I2510 Atherosclerotic heart disease of native coronary artery without angina pectoris: Secondary | ICD-10-CM | POA: Diagnosis not present

## 2020-11-04 DIAGNOSIS — F419 Anxiety disorder, unspecified: Secondary | ICD-10-CM | POA: Diagnosis not present

## 2020-11-04 DIAGNOSIS — F32A Depression, unspecified: Secondary | ICD-10-CM | POA: Diagnosis not present

## 2020-11-04 DIAGNOSIS — G4700 Insomnia, unspecified: Secondary | ICD-10-CM | POA: Diagnosis not present

## 2020-11-04 DIAGNOSIS — I5032 Chronic diastolic (congestive) heart failure: Secondary | ICD-10-CM | POA: Diagnosis not present

## 2020-11-05 DIAGNOSIS — F0150 Vascular dementia without behavioral disturbance: Secondary | ICD-10-CM | POA: Diagnosis not present

## 2020-11-05 DIAGNOSIS — J9611 Chronic respiratory failure with hypoxia: Secondary | ICD-10-CM | POA: Diagnosis not present

## 2020-11-05 DIAGNOSIS — I5032 Chronic diastolic (congestive) heart failure: Secondary | ICD-10-CM | POA: Diagnosis not present

## 2020-11-05 DIAGNOSIS — F419 Anxiety disorder, unspecified: Secondary | ICD-10-CM | POA: Diagnosis not present

## 2020-11-05 DIAGNOSIS — I25119 Atherosclerotic heart disease of native coronary artery with unspecified angina pectoris: Secondary | ICD-10-CM | POA: Diagnosis not present

## 2020-11-05 DIAGNOSIS — I679 Cerebrovascular disease, unspecified: Secondary | ICD-10-CM | POA: Diagnosis not present

## 2020-11-06 DIAGNOSIS — J9611 Chronic respiratory failure with hypoxia: Secondary | ICD-10-CM | POA: Diagnosis not present

## 2020-11-06 DIAGNOSIS — I679 Cerebrovascular disease, unspecified: Secondary | ICD-10-CM | POA: Diagnosis not present

## 2020-11-06 DIAGNOSIS — I5032 Chronic diastolic (congestive) heart failure: Secondary | ICD-10-CM | POA: Diagnosis not present

## 2020-11-06 DIAGNOSIS — F0150 Vascular dementia without behavioral disturbance: Secondary | ICD-10-CM | POA: Diagnosis not present

## 2020-11-06 DIAGNOSIS — F419 Anxiety disorder, unspecified: Secondary | ICD-10-CM | POA: Diagnosis not present

## 2020-11-06 DIAGNOSIS — I25119 Atherosclerotic heart disease of native coronary artery with unspecified angina pectoris: Secondary | ICD-10-CM | POA: Diagnosis not present

## 2020-11-07 DIAGNOSIS — I679 Cerebrovascular disease, unspecified: Secondary | ICD-10-CM | POA: Diagnosis not present

## 2020-11-07 DIAGNOSIS — I5032 Chronic diastolic (congestive) heart failure: Secondary | ICD-10-CM | POA: Diagnosis not present

## 2020-11-07 DIAGNOSIS — E039 Hypothyroidism, unspecified: Secondary | ICD-10-CM | POA: Diagnosis not present

## 2020-11-07 DIAGNOSIS — F0150 Vascular dementia without behavioral disturbance: Secondary | ICD-10-CM | POA: Diagnosis not present

## 2020-11-07 DIAGNOSIS — I10 Essential (primary) hypertension: Secondary | ICD-10-CM | POA: Diagnosis not present

## 2020-11-07 DIAGNOSIS — F419 Anxiety disorder, unspecified: Secondary | ICD-10-CM | POA: Diagnosis not present

## 2020-11-07 DIAGNOSIS — J9611 Chronic respiratory failure with hypoxia: Secondary | ICD-10-CM | POA: Diagnosis not present

## 2020-11-07 DIAGNOSIS — I2510 Atherosclerotic heart disease of native coronary artery without angina pectoris: Secondary | ICD-10-CM | POA: Diagnosis not present

## 2020-11-07 DIAGNOSIS — I25119 Atherosclerotic heart disease of native coronary artery with unspecified angina pectoris: Secondary | ICD-10-CM | POA: Diagnosis not present

## 2020-11-12 DIAGNOSIS — F419 Anxiety disorder, unspecified: Secondary | ICD-10-CM | POA: Diagnosis not present

## 2020-11-12 DIAGNOSIS — I25119 Atherosclerotic heart disease of native coronary artery with unspecified angina pectoris: Secondary | ICD-10-CM | POA: Diagnosis not present

## 2020-11-12 DIAGNOSIS — I679 Cerebrovascular disease, unspecified: Secondary | ICD-10-CM | POA: Diagnosis not present

## 2020-11-12 DIAGNOSIS — F0150 Vascular dementia without behavioral disturbance: Secondary | ICD-10-CM | POA: Diagnosis not present

## 2020-11-12 DIAGNOSIS — I5032 Chronic diastolic (congestive) heart failure: Secondary | ICD-10-CM | POA: Diagnosis not present

## 2020-11-12 DIAGNOSIS — J9611 Chronic respiratory failure with hypoxia: Secondary | ICD-10-CM | POA: Diagnosis not present

## 2020-11-13 DIAGNOSIS — F419 Anxiety disorder, unspecified: Secondary | ICD-10-CM | POA: Diagnosis not present

## 2020-11-13 DIAGNOSIS — I5032 Chronic diastolic (congestive) heart failure: Secondary | ICD-10-CM | POA: Diagnosis not present

## 2020-11-13 DIAGNOSIS — I25119 Atherosclerotic heart disease of native coronary artery with unspecified angina pectoris: Secondary | ICD-10-CM | POA: Diagnosis not present

## 2020-11-13 DIAGNOSIS — F0150 Vascular dementia without behavioral disturbance: Secondary | ICD-10-CM | POA: Diagnosis not present

## 2020-11-13 DIAGNOSIS — I679 Cerebrovascular disease, unspecified: Secondary | ICD-10-CM | POA: Diagnosis not present

## 2020-11-13 DIAGNOSIS — J9611 Chronic respiratory failure with hypoxia: Secondary | ICD-10-CM | POA: Diagnosis not present

## 2020-11-14 DIAGNOSIS — I25119 Atherosclerotic heart disease of native coronary artery with unspecified angina pectoris: Secondary | ICD-10-CM | POA: Diagnosis not present

## 2020-11-14 DIAGNOSIS — F0150 Vascular dementia without behavioral disturbance: Secondary | ICD-10-CM | POA: Diagnosis not present

## 2020-11-14 DIAGNOSIS — I5032 Chronic diastolic (congestive) heart failure: Secondary | ICD-10-CM | POA: Diagnosis not present

## 2020-11-14 DIAGNOSIS — J9611 Chronic respiratory failure with hypoxia: Secondary | ICD-10-CM | POA: Diagnosis not present

## 2020-11-14 DIAGNOSIS — I679 Cerebrovascular disease, unspecified: Secondary | ICD-10-CM | POA: Diagnosis not present

## 2020-11-14 DIAGNOSIS — F419 Anxiety disorder, unspecified: Secondary | ICD-10-CM | POA: Diagnosis not present

## 2020-11-18 DIAGNOSIS — F419 Anxiety disorder, unspecified: Secondary | ICD-10-CM | POA: Diagnosis not present

## 2020-11-18 DIAGNOSIS — I5032 Chronic diastolic (congestive) heart failure: Secondary | ICD-10-CM | POA: Diagnosis not present

## 2020-11-18 DIAGNOSIS — F0150 Vascular dementia without behavioral disturbance: Secondary | ICD-10-CM | POA: Diagnosis not present

## 2020-11-18 DIAGNOSIS — J9611 Chronic respiratory failure with hypoxia: Secondary | ICD-10-CM | POA: Diagnosis not present

## 2020-11-18 DIAGNOSIS — I25119 Atherosclerotic heart disease of native coronary artery with unspecified angina pectoris: Secondary | ICD-10-CM | POA: Diagnosis not present

## 2020-11-18 DIAGNOSIS — I679 Cerebrovascular disease, unspecified: Secondary | ICD-10-CM | POA: Diagnosis not present

## 2020-11-19 DIAGNOSIS — F0150 Vascular dementia without behavioral disturbance: Secondary | ICD-10-CM | POA: Diagnosis not present

## 2020-11-19 DIAGNOSIS — J9611 Chronic respiratory failure with hypoxia: Secondary | ICD-10-CM | POA: Diagnosis not present

## 2020-11-19 DIAGNOSIS — I25119 Atherosclerotic heart disease of native coronary artery with unspecified angina pectoris: Secondary | ICD-10-CM | POA: Diagnosis not present

## 2020-11-19 DIAGNOSIS — F419 Anxiety disorder, unspecified: Secondary | ICD-10-CM | POA: Diagnosis not present

## 2020-11-19 DIAGNOSIS — I679 Cerebrovascular disease, unspecified: Secondary | ICD-10-CM | POA: Diagnosis not present

## 2020-11-19 DIAGNOSIS — I5032 Chronic diastolic (congestive) heart failure: Secondary | ICD-10-CM | POA: Diagnosis not present

## 2020-11-21 DIAGNOSIS — F419 Anxiety disorder, unspecified: Secondary | ICD-10-CM | POA: Diagnosis not present

## 2020-11-21 DIAGNOSIS — I679 Cerebrovascular disease, unspecified: Secondary | ICD-10-CM | POA: Diagnosis not present

## 2020-11-21 DIAGNOSIS — I5032 Chronic diastolic (congestive) heart failure: Secondary | ICD-10-CM | POA: Diagnosis not present

## 2020-11-21 DIAGNOSIS — J9611 Chronic respiratory failure with hypoxia: Secondary | ICD-10-CM | POA: Diagnosis not present

## 2020-11-21 DIAGNOSIS — F0150 Vascular dementia without behavioral disturbance: Secondary | ICD-10-CM | POA: Diagnosis not present

## 2020-11-21 DIAGNOSIS — I25119 Atherosclerotic heart disease of native coronary artery with unspecified angina pectoris: Secondary | ICD-10-CM | POA: Diagnosis not present

## 2020-11-22 DIAGNOSIS — F419 Anxiety disorder, unspecified: Secondary | ICD-10-CM | POA: Diagnosis not present

## 2020-11-22 DIAGNOSIS — R159 Full incontinence of feces: Secondary | ICD-10-CM | POA: Diagnosis not present

## 2020-11-22 DIAGNOSIS — R32 Unspecified urinary incontinence: Secondary | ICD-10-CM | POA: Diagnosis not present

## 2020-11-22 DIAGNOSIS — L89622 Pressure ulcer of left heel, stage 2: Secondary | ICD-10-CM | POA: Diagnosis not present

## 2020-11-22 DIAGNOSIS — Z8673 Personal history of transient ischemic attack (TIA), and cerebral infarction without residual deficits: Secondary | ICD-10-CM | POA: Diagnosis not present

## 2020-11-22 DIAGNOSIS — J9611 Chronic respiratory failure with hypoxia: Secondary | ICD-10-CM | POA: Diagnosis not present

## 2020-11-22 DIAGNOSIS — I679 Cerebrovascular disease, unspecified: Secondary | ICD-10-CM | POA: Diagnosis not present

## 2020-11-22 DIAGNOSIS — Z8781 Personal history of (healed) traumatic fracture: Secondary | ICD-10-CM | POA: Diagnosis not present

## 2020-11-22 DIAGNOSIS — J449 Chronic obstructive pulmonary disease, unspecified: Secondary | ICD-10-CM | POA: Diagnosis not present

## 2020-11-22 DIAGNOSIS — Z87440 Personal history of urinary (tract) infections: Secondary | ICD-10-CM | POA: Diagnosis not present

## 2020-11-22 DIAGNOSIS — H04129 Dry eye syndrome of unspecified lacrimal gland: Secondary | ICD-10-CM | POA: Diagnosis not present

## 2020-11-22 DIAGNOSIS — Z6824 Body mass index (BMI) 24.0-24.9, adult: Secondary | ICD-10-CM | POA: Diagnosis not present

## 2020-11-22 DIAGNOSIS — E440 Moderate protein-calorie malnutrition: Secondary | ICD-10-CM | POA: Diagnosis not present

## 2020-11-22 DIAGNOSIS — I25119 Atherosclerotic heart disease of native coronary artery with unspecified angina pectoris: Secondary | ICD-10-CM | POA: Diagnosis not present

## 2020-11-22 DIAGNOSIS — F0150 Vascular dementia without behavioral disturbance: Secondary | ICD-10-CM | POA: Diagnosis not present

## 2020-11-22 DIAGNOSIS — Z872 Personal history of diseases of the skin and subcutaneous tissue: Secondary | ICD-10-CM | POA: Diagnosis not present

## 2020-11-22 DIAGNOSIS — Z741 Need for assistance with personal care: Secondary | ICD-10-CM | POA: Diagnosis not present

## 2020-11-22 DIAGNOSIS — I77810 Thoracic aortic ectasia: Secondary | ICD-10-CM | POA: Diagnosis not present

## 2020-11-22 DIAGNOSIS — Z8616 Personal history of COVID-19: Secondary | ICD-10-CM | POA: Diagnosis not present

## 2020-11-22 DIAGNOSIS — E039 Hypothyroidism, unspecified: Secondary | ICD-10-CM | POA: Diagnosis not present

## 2020-11-22 DIAGNOSIS — I5032 Chronic diastolic (congestive) heart failure: Secondary | ICD-10-CM | POA: Diagnosis not present

## 2020-11-22 DIAGNOSIS — Z9981 Dependence on supplemental oxygen: Secondary | ICD-10-CM | POA: Diagnosis not present

## 2020-11-26 DIAGNOSIS — I25119 Atherosclerotic heart disease of native coronary artery with unspecified angina pectoris: Secondary | ICD-10-CM | POA: Diagnosis not present

## 2020-11-26 DIAGNOSIS — I679 Cerebrovascular disease, unspecified: Secondary | ICD-10-CM | POA: Diagnosis not present

## 2020-11-26 DIAGNOSIS — F419 Anxiety disorder, unspecified: Secondary | ICD-10-CM | POA: Diagnosis not present

## 2020-11-26 DIAGNOSIS — I5032 Chronic diastolic (congestive) heart failure: Secondary | ICD-10-CM | POA: Diagnosis not present

## 2020-11-26 DIAGNOSIS — J9611 Chronic respiratory failure with hypoxia: Secondary | ICD-10-CM | POA: Diagnosis not present

## 2020-11-26 DIAGNOSIS — F0150 Vascular dementia without behavioral disturbance: Secondary | ICD-10-CM | POA: Diagnosis not present

## 2020-11-28 DIAGNOSIS — F419 Anxiety disorder, unspecified: Secondary | ICD-10-CM | POA: Diagnosis not present

## 2020-11-28 DIAGNOSIS — J9610 Chronic respiratory failure, unspecified whether with hypoxia or hypercapnia: Secondary | ICD-10-CM | POA: Diagnosis not present

## 2020-11-28 DIAGNOSIS — F32A Depression, unspecified: Secondary | ICD-10-CM | POA: Diagnosis not present

## 2020-11-28 DIAGNOSIS — K5900 Constipation, unspecified: Secondary | ICD-10-CM | POA: Diagnosis not present

## 2020-11-28 DIAGNOSIS — E039 Hypothyroidism, unspecified: Secondary | ICD-10-CM | POA: Diagnosis not present

## 2020-11-28 DIAGNOSIS — I10 Essential (primary) hypertension: Secondary | ICD-10-CM | POA: Diagnosis not present

## 2020-11-28 DIAGNOSIS — F0150 Vascular dementia without behavioral disturbance: Secondary | ICD-10-CM | POA: Diagnosis not present

## 2020-11-28 DIAGNOSIS — I25119 Atherosclerotic heart disease of native coronary artery with unspecified angina pectoris: Secondary | ICD-10-CM | POA: Diagnosis not present

## 2020-11-28 DIAGNOSIS — I2510 Atherosclerotic heart disease of native coronary artery without angina pectoris: Secondary | ICD-10-CM | POA: Diagnosis not present

## 2020-11-28 DIAGNOSIS — J9611 Chronic respiratory failure with hypoxia: Secondary | ICD-10-CM | POA: Diagnosis not present

## 2020-11-28 DIAGNOSIS — I5032 Chronic diastolic (congestive) heart failure: Secondary | ICD-10-CM | POA: Diagnosis not present

## 2020-11-28 DIAGNOSIS — I679 Cerebrovascular disease, unspecified: Secondary | ICD-10-CM | POA: Diagnosis not present

## 2020-12-02 DIAGNOSIS — J9610 Chronic respiratory failure, unspecified whether with hypoxia or hypercapnia: Secondary | ICD-10-CM | POA: Diagnosis not present

## 2020-12-02 DIAGNOSIS — I679 Cerebrovascular disease, unspecified: Secondary | ICD-10-CM | POA: Diagnosis not present

## 2020-12-02 DIAGNOSIS — I10 Essential (primary) hypertension: Secondary | ICD-10-CM | POA: Diagnosis not present

## 2020-12-02 DIAGNOSIS — F0150 Vascular dementia without behavioral disturbance: Secondary | ICD-10-CM | POA: Diagnosis not present

## 2020-12-02 DIAGNOSIS — G4700 Insomnia, unspecified: Secondary | ICD-10-CM | POA: Diagnosis not present

## 2020-12-02 DIAGNOSIS — I5032 Chronic diastolic (congestive) heart failure: Secondary | ICD-10-CM | POA: Diagnosis not present

## 2020-12-02 DIAGNOSIS — E039 Hypothyroidism, unspecified: Secondary | ICD-10-CM | POA: Diagnosis not present

## 2020-12-02 DIAGNOSIS — I25119 Atherosclerotic heart disease of native coronary artery with unspecified angina pectoris: Secondary | ICD-10-CM | POA: Diagnosis not present

## 2020-12-02 DIAGNOSIS — J9611 Chronic respiratory failure with hypoxia: Secondary | ICD-10-CM | POA: Diagnosis not present

## 2020-12-02 DIAGNOSIS — F419 Anxiety disorder, unspecified: Secondary | ICD-10-CM | POA: Diagnosis not present

## 2020-12-02 DIAGNOSIS — K5900 Constipation, unspecified: Secondary | ICD-10-CM | POA: Diagnosis not present

## 2020-12-02 DIAGNOSIS — I2510 Atherosclerotic heart disease of native coronary artery without angina pectoris: Secondary | ICD-10-CM | POA: Diagnosis not present

## 2020-12-03 DIAGNOSIS — F419 Anxiety disorder, unspecified: Secondary | ICD-10-CM | POA: Diagnosis not present

## 2020-12-03 DIAGNOSIS — F0150 Vascular dementia without behavioral disturbance: Secondary | ICD-10-CM | POA: Diagnosis not present

## 2020-12-03 DIAGNOSIS — I679 Cerebrovascular disease, unspecified: Secondary | ICD-10-CM | POA: Diagnosis not present

## 2020-12-03 DIAGNOSIS — I5032 Chronic diastolic (congestive) heart failure: Secondary | ICD-10-CM | POA: Diagnosis not present

## 2020-12-03 DIAGNOSIS — J9611 Chronic respiratory failure with hypoxia: Secondary | ICD-10-CM | POA: Diagnosis not present

## 2020-12-03 DIAGNOSIS — I25119 Atherosclerotic heart disease of native coronary artery with unspecified angina pectoris: Secondary | ICD-10-CM | POA: Diagnosis not present

## 2020-12-05 DIAGNOSIS — I679 Cerebrovascular disease, unspecified: Secondary | ICD-10-CM | POA: Diagnosis not present

## 2020-12-05 DIAGNOSIS — F419 Anxiety disorder, unspecified: Secondary | ICD-10-CM | POA: Diagnosis not present

## 2020-12-05 DIAGNOSIS — I25119 Atherosclerotic heart disease of native coronary artery with unspecified angina pectoris: Secondary | ICD-10-CM | POA: Diagnosis not present

## 2020-12-05 DIAGNOSIS — F0150 Vascular dementia without behavioral disturbance: Secondary | ICD-10-CM | POA: Diagnosis not present

## 2020-12-05 DIAGNOSIS — I5032 Chronic diastolic (congestive) heart failure: Secondary | ICD-10-CM | POA: Diagnosis not present

## 2020-12-05 DIAGNOSIS — J9611 Chronic respiratory failure with hypoxia: Secondary | ICD-10-CM | POA: Diagnosis not present

## 2020-12-10 DIAGNOSIS — I25119 Atherosclerotic heart disease of native coronary artery with unspecified angina pectoris: Secondary | ICD-10-CM | POA: Diagnosis not present

## 2020-12-10 DIAGNOSIS — F419 Anxiety disorder, unspecified: Secondary | ICD-10-CM | POA: Diagnosis not present

## 2020-12-10 DIAGNOSIS — J9611 Chronic respiratory failure with hypoxia: Secondary | ICD-10-CM | POA: Diagnosis not present

## 2020-12-10 DIAGNOSIS — I679 Cerebrovascular disease, unspecified: Secondary | ICD-10-CM | POA: Diagnosis not present

## 2020-12-10 DIAGNOSIS — I5032 Chronic diastolic (congestive) heart failure: Secondary | ICD-10-CM | POA: Diagnosis not present

## 2020-12-10 DIAGNOSIS — F0150 Vascular dementia without behavioral disturbance: Secondary | ICD-10-CM | POA: Diagnosis not present

## 2020-12-12 DIAGNOSIS — F419 Anxiety disorder, unspecified: Secondary | ICD-10-CM | POA: Diagnosis not present

## 2020-12-12 DIAGNOSIS — I5032 Chronic diastolic (congestive) heart failure: Secondary | ICD-10-CM | POA: Diagnosis not present

## 2020-12-12 DIAGNOSIS — I25119 Atherosclerotic heart disease of native coronary artery with unspecified angina pectoris: Secondary | ICD-10-CM | POA: Diagnosis not present

## 2020-12-12 DIAGNOSIS — I679 Cerebrovascular disease, unspecified: Secondary | ICD-10-CM | POA: Diagnosis not present

## 2020-12-12 DIAGNOSIS — F0150 Vascular dementia without behavioral disturbance: Secondary | ICD-10-CM | POA: Diagnosis not present

## 2020-12-12 DIAGNOSIS — J9611 Chronic respiratory failure with hypoxia: Secondary | ICD-10-CM | POA: Diagnosis not present

## 2020-12-13 DIAGNOSIS — I5032 Chronic diastolic (congestive) heart failure: Secondary | ICD-10-CM | POA: Diagnosis not present

## 2020-12-13 DIAGNOSIS — I679 Cerebrovascular disease, unspecified: Secondary | ICD-10-CM | POA: Diagnosis not present

## 2020-12-13 DIAGNOSIS — J9611 Chronic respiratory failure with hypoxia: Secondary | ICD-10-CM | POA: Diagnosis not present

## 2020-12-13 DIAGNOSIS — F419 Anxiety disorder, unspecified: Secondary | ICD-10-CM | POA: Diagnosis not present

## 2020-12-13 DIAGNOSIS — I25119 Atherosclerotic heart disease of native coronary artery with unspecified angina pectoris: Secondary | ICD-10-CM | POA: Diagnosis not present

## 2020-12-13 DIAGNOSIS — F0150 Vascular dementia without behavioral disturbance: Secondary | ICD-10-CM | POA: Diagnosis not present

## 2020-12-17 DIAGNOSIS — I25119 Atherosclerotic heart disease of native coronary artery with unspecified angina pectoris: Secondary | ICD-10-CM | POA: Diagnosis not present

## 2020-12-17 DIAGNOSIS — J9611 Chronic respiratory failure with hypoxia: Secondary | ICD-10-CM | POA: Diagnosis not present

## 2020-12-17 DIAGNOSIS — I5032 Chronic diastolic (congestive) heart failure: Secondary | ICD-10-CM | POA: Diagnosis not present

## 2020-12-17 DIAGNOSIS — F419 Anxiety disorder, unspecified: Secondary | ICD-10-CM | POA: Diagnosis not present

## 2020-12-17 DIAGNOSIS — I679 Cerebrovascular disease, unspecified: Secondary | ICD-10-CM | POA: Diagnosis not present

## 2020-12-17 DIAGNOSIS — F0150 Vascular dementia without behavioral disturbance: Secondary | ICD-10-CM | POA: Diagnosis not present

## 2020-12-19 DIAGNOSIS — I25119 Atherosclerotic heart disease of native coronary artery with unspecified angina pectoris: Secondary | ICD-10-CM | POA: Diagnosis not present

## 2020-12-19 DIAGNOSIS — J9611 Chronic respiratory failure with hypoxia: Secondary | ICD-10-CM | POA: Diagnosis not present

## 2020-12-19 DIAGNOSIS — F419 Anxiety disorder, unspecified: Secondary | ICD-10-CM | POA: Diagnosis not present

## 2020-12-19 DIAGNOSIS — I679 Cerebrovascular disease, unspecified: Secondary | ICD-10-CM | POA: Diagnosis not present

## 2020-12-19 DIAGNOSIS — F0150 Vascular dementia without behavioral disturbance: Secondary | ICD-10-CM | POA: Diagnosis not present

## 2020-12-19 DIAGNOSIS — I5032 Chronic diastolic (congestive) heart failure: Secondary | ICD-10-CM | POA: Diagnosis not present

## 2020-12-22 DIAGNOSIS — I5032 Chronic diastolic (congestive) heart failure: Secondary | ICD-10-CM | POA: Diagnosis not present

## 2020-12-22 DIAGNOSIS — I679 Cerebrovascular disease, unspecified: Secondary | ICD-10-CM | POA: Diagnosis not present

## 2020-12-22 DIAGNOSIS — F0150 Vascular dementia without behavioral disturbance: Secondary | ICD-10-CM | POA: Diagnosis not present

## 2020-12-22 DIAGNOSIS — F419 Anxiety disorder, unspecified: Secondary | ICD-10-CM | POA: Diagnosis not present

## 2020-12-22 DIAGNOSIS — I25119 Atherosclerotic heart disease of native coronary artery with unspecified angina pectoris: Secondary | ICD-10-CM | POA: Diagnosis not present

## 2020-12-22 DIAGNOSIS — J9611 Chronic respiratory failure with hypoxia: Secondary | ICD-10-CM | POA: Diagnosis not present

## 2020-12-23 DIAGNOSIS — Z87440 Personal history of urinary (tract) infections: Secondary | ICD-10-CM | POA: Diagnosis not present

## 2020-12-23 DIAGNOSIS — I679 Cerebrovascular disease, unspecified: Secondary | ICD-10-CM | POA: Diagnosis not present

## 2020-12-23 DIAGNOSIS — R159 Full incontinence of feces: Secondary | ICD-10-CM | POA: Diagnosis not present

## 2020-12-23 DIAGNOSIS — L89622 Pressure ulcer of left heel, stage 2: Secondary | ICD-10-CM | POA: Diagnosis not present

## 2020-12-23 DIAGNOSIS — Z8781 Personal history of (healed) traumatic fracture: Secondary | ICD-10-CM | POA: Diagnosis not present

## 2020-12-23 DIAGNOSIS — E440 Moderate protein-calorie malnutrition: Secondary | ICD-10-CM | POA: Diagnosis not present

## 2020-12-23 DIAGNOSIS — Z6824 Body mass index (BMI) 24.0-24.9, adult: Secondary | ICD-10-CM | POA: Diagnosis not present

## 2020-12-23 DIAGNOSIS — R32 Unspecified urinary incontinence: Secondary | ICD-10-CM | POA: Diagnosis not present

## 2020-12-23 DIAGNOSIS — Z8616 Personal history of COVID-19: Secondary | ICD-10-CM | POA: Diagnosis not present

## 2020-12-23 DIAGNOSIS — I77810 Thoracic aortic ectasia: Secondary | ICD-10-CM | POA: Diagnosis not present

## 2020-12-23 DIAGNOSIS — E039 Hypothyroidism, unspecified: Secondary | ICD-10-CM | POA: Diagnosis not present

## 2020-12-23 DIAGNOSIS — F0150 Vascular dementia without behavioral disturbance: Secondary | ICD-10-CM | POA: Diagnosis not present

## 2020-12-23 DIAGNOSIS — Z9981 Dependence on supplemental oxygen: Secondary | ICD-10-CM | POA: Diagnosis not present

## 2020-12-23 DIAGNOSIS — J9611 Chronic respiratory failure with hypoxia: Secondary | ICD-10-CM | POA: Diagnosis not present

## 2020-12-23 DIAGNOSIS — Z872 Personal history of diseases of the skin and subcutaneous tissue: Secondary | ICD-10-CM | POA: Diagnosis not present

## 2020-12-23 DIAGNOSIS — F419 Anxiety disorder, unspecified: Secondary | ICD-10-CM | POA: Diagnosis not present

## 2020-12-23 DIAGNOSIS — J449 Chronic obstructive pulmonary disease, unspecified: Secondary | ICD-10-CM | POA: Diagnosis not present

## 2020-12-23 DIAGNOSIS — Z741 Need for assistance with personal care: Secondary | ICD-10-CM | POA: Diagnosis not present

## 2020-12-23 DIAGNOSIS — Z8673 Personal history of transient ischemic attack (TIA), and cerebral infarction without residual deficits: Secondary | ICD-10-CM | POA: Diagnosis not present

## 2020-12-23 DIAGNOSIS — I25119 Atherosclerotic heart disease of native coronary artery with unspecified angina pectoris: Secondary | ICD-10-CM | POA: Diagnosis not present

## 2020-12-23 DIAGNOSIS — I5032 Chronic diastolic (congestive) heart failure: Secondary | ICD-10-CM | POA: Diagnosis not present

## 2020-12-23 DIAGNOSIS — H04129 Dry eye syndrome of unspecified lacrimal gland: Secondary | ICD-10-CM | POA: Diagnosis not present

## 2020-12-24 DIAGNOSIS — I25119 Atherosclerotic heart disease of native coronary artery with unspecified angina pectoris: Secondary | ICD-10-CM | POA: Diagnosis not present

## 2020-12-24 DIAGNOSIS — J9611 Chronic respiratory failure with hypoxia: Secondary | ICD-10-CM | POA: Diagnosis not present

## 2020-12-24 DIAGNOSIS — I5032 Chronic diastolic (congestive) heart failure: Secondary | ICD-10-CM | POA: Diagnosis not present

## 2020-12-24 DIAGNOSIS — F419 Anxiety disorder, unspecified: Secondary | ICD-10-CM | POA: Diagnosis not present

## 2020-12-24 DIAGNOSIS — F0150 Vascular dementia without behavioral disturbance: Secondary | ICD-10-CM | POA: Diagnosis not present

## 2020-12-24 DIAGNOSIS — I679 Cerebrovascular disease, unspecified: Secondary | ICD-10-CM | POA: Diagnosis not present

## 2020-12-26 DIAGNOSIS — J9611 Chronic respiratory failure with hypoxia: Secondary | ICD-10-CM | POA: Diagnosis not present

## 2020-12-26 DIAGNOSIS — G4700 Insomnia, unspecified: Secondary | ICD-10-CM | POA: Diagnosis not present

## 2020-12-26 DIAGNOSIS — I679 Cerebrovascular disease, unspecified: Secondary | ICD-10-CM | POA: Diagnosis not present

## 2020-12-26 DIAGNOSIS — F0150 Vascular dementia without behavioral disturbance: Secondary | ICD-10-CM | POA: Diagnosis not present

## 2020-12-26 DIAGNOSIS — E039 Hypothyroidism, unspecified: Secondary | ICD-10-CM | POA: Diagnosis not present

## 2020-12-26 DIAGNOSIS — F32A Depression, unspecified: Secondary | ICD-10-CM | POA: Diagnosis not present

## 2020-12-26 DIAGNOSIS — J9610 Chronic respiratory failure, unspecified whether with hypoxia or hypercapnia: Secondary | ICD-10-CM | POA: Diagnosis not present

## 2020-12-26 DIAGNOSIS — I5032 Chronic diastolic (congestive) heart failure: Secondary | ICD-10-CM | POA: Diagnosis not present

## 2020-12-26 DIAGNOSIS — I10 Essential (primary) hypertension: Secondary | ICD-10-CM | POA: Diagnosis not present

## 2020-12-26 DIAGNOSIS — F419 Anxiety disorder, unspecified: Secondary | ICD-10-CM | POA: Diagnosis not present

## 2020-12-26 DIAGNOSIS — I25119 Atherosclerotic heart disease of native coronary artery with unspecified angina pectoris: Secondary | ICD-10-CM | POA: Diagnosis not present

## 2020-12-26 DIAGNOSIS — I2510 Atherosclerotic heart disease of native coronary artery without angina pectoris: Secondary | ICD-10-CM | POA: Diagnosis not present

## 2020-12-26 DIAGNOSIS — K5900 Constipation, unspecified: Secondary | ICD-10-CM | POA: Diagnosis not present

## 2020-12-27 DIAGNOSIS — F0150 Vascular dementia without behavioral disturbance: Secondary | ICD-10-CM | POA: Diagnosis not present

## 2020-12-27 DIAGNOSIS — I5032 Chronic diastolic (congestive) heart failure: Secondary | ICD-10-CM | POA: Diagnosis not present

## 2020-12-27 DIAGNOSIS — I2510 Atherosclerotic heart disease of native coronary artery without angina pectoris: Secondary | ICD-10-CM | POA: Diagnosis not present

## 2020-12-27 DIAGNOSIS — E039 Hypothyroidism, unspecified: Secondary | ICD-10-CM | POA: Diagnosis not present

## 2020-12-27 DIAGNOSIS — I10 Essential (primary) hypertension: Secondary | ICD-10-CM | POA: Diagnosis not present

## 2020-12-31 DIAGNOSIS — I5032 Chronic diastolic (congestive) heart failure: Secondary | ICD-10-CM | POA: Diagnosis not present

## 2020-12-31 DIAGNOSIS — I679 Cerebrovascular disease, unspecified: Secondary | ICD-10-CM | POA: Diagnosis not present

## 2020-12-31 DIAGNOSIS — J9611 Chronic respiratory failure with hypoxia: Secondary | ICD-10-CM | POA: Diagnosis not present

## 2020-12-31 DIAGNOSIS — I25119 Atherosclerotic heart disease of native coronary artery with unspecified angina pectoris: Secondary | ICD-10-CM | POA: Diagnosis not present

## 2020-12-31 DIAGNOSIS — F419 Anxiety disorder, unspecified: Secondary | ICD-10-CM | POA: Diagnosis not present

## 2020-12-31 DIAGNOSIS — F0150 Vascular dementia without behavioral disturbance: Secondary | ICD-10-CM | POA: Diagnosis not present

## 2021-01-01 DIAGNOSIS — G4700 Insomnia, unspecified: Secondary | ICD-10-CM | POA: Diagnosis not present

## 2021-01-01 DIAGNOSIS — I10 Essential (primary) hypertension: Secondary | ICD-10-CM | POA: Diagnosis not present

## 2021-01-01 DIAGNOSIS — J9610 Chronic respiratory failure, unspecified whether with hypoxia or hypercapnia: Secondary | ICD-10-CM | POA: Diagnosis not present

## 2021-01-01 DIAGNOSIS — K5900 Constipation, unspecified: Secondary | ICD-10-CM | POA: Diagnosis not present

## 2021-01-01 DIAGNOSIS — I2510 Atherosclerotic heart disease of native coronary artery without angina pectoris: Secondary | ICD-10-CM | POA: Diagnosis not present

## 2021-01-01 DIAGNOSIS — F0150 Vascular dementia without behavioral disturbance: Secondary | ICD-10-CM | POA: Diagnosis not present

## 2021-01-01 DIAGNOSIS — E039 Hypothyroidism, unspecified: Secondary | ICD-10-CM | POA: Diagnosis not present

## 2021-01-01 DIAGNOSIS — J9611 Chronic respiratory failure with hypoxia: Secondary | ICD-10-CM | POA: Diagnosis not present

## 2021-01-01 DIAGNOSIS — I25119 Atherosclerotic heart disease of native coronary artery with unspecified angina pectoris: Secondary | ICD-10-CM | POA: Diagnosis not present

## 2021-01-01 DIAGNOSIS — I5032 Chronic diastolic (congestive) heart failure: Secondary | ICD-10-CM | POA: Diagnosis not present

## 2021-01-01 DIAGNOSIS — F419 Anxiety disorder, unspecified: Secondary | ICD-10-CM | POA: Diagnosis not present

## 2021-01-01 DIAGNOSIS — I679 Cerebrovascular disease, unspecified: Secondary | ICD-10-CM | POA: Diagnosis not present

## 2021-01-02 DIAGNOSIS — I679 Cerebrovascular disease, unspecified: Secondary | ICD-10-CM | POA: Diagnosis not present

## 2021-01-02 DIAGNOSIS — I5032 Chronic diastolic (congestive) heart failure: Secondary | ICD-10-CM | POA: Diagnosis not present

## 2021-01-02 DIAGNOSIS — F419 Anxiety disorder, unspecified: Secondary | ICD-10-CM | POA: Diagnosis not present

## 2021-01-02 DIAGNOSIS — J9611 Chronic respiratory failure with hypoxia: Secondary | ICD-10-CM | POA: Diagnosis not present

## 2021-01-02 DIAGNOSIS — F0150 Vascular dementia without behavioral disturbance: Secondary | ICD-10-CM | POA: Diagnosis not present

## 2021-01-02 DIAGNOSIS — I25119 Atherosclerotic heart disease of native coronary artery with unspecified angina pectoris: Secondary | ICD-10-CM | POA: Diagnosis not present

## 2021-01-07 DIAGNOSIS — I5032 Chronic diastolic (congestive) heart failure: Secondary | ICD-10-CM | POA: Diagnosis not present

## 2021-01-07 DIAGNOSIS — J9611 Chronic respiratory failure with hypoxia: Secondary | ICD-10-CM | POA: Diagnosis not present

## 2021-01-07 DIAGNOSIS — F419 Anxiety disorder, unspecified: Secondary | ICD-10-CM | POA: Diagnosis not present

## 2021-01-07 DIAGNOSIS — I25119 Atherosclerotic heart disease of native coronary artery with unspecified angina pectoris: Secondary | ICD-10-CM | POA: Diagnosis not present

## 2021-01-07 DIAGNOSIS — F0150 Vascular dementia without behavioral disturbance: Secondary | ICD-10-CM | POA: Diagnosis not present

## 2021-01-07 DIAGNOSIS — I679 Cerebrovascular disease, unspecified: Secondary | ICD-10-CM | POA: Diagnosis not present

## 2021-01-09 DIAGNOSIS — F0150 Vascular dementia without behavioral disturbance: Secondary | ICD-10-CM | POA: Diagnosis not present

## 2021-01-09 DIAGNOSIS — I5032 Chronic diastolic (congestive) heart failure: Secondary | ICD-10-CM | POA: Diagnosis not present

## 2021-01-09 DIAGNOSIS — I679 Cerebrovascular disease, unspecified: Secondary | ICD-10-CM | POA: Diagnosis not present

## 2021-01-09 DIAGNOSIS — J9611 Chronic respiratory failure with hypoxia: Secondary | ICD-10-CM | POA: Diagnosis not present

## 2021-01-09 DIAGNOSIS — F419 Anxiety disorder, unspecified: Secondary | ICD-10-CM | POA: Diagnosis not present

## 2021-01-09 DIAGNOSIS — I25119 Atherosclerotic heart disease of native coronary artery with unspecified angina pectoris: Secondary | ICD-10-CM | POA: Diagnosis not present

## 2021-01-10 DIAGNOSIS — I679 Cerebrovascular disease, unspecified: Secondary | ICD-10-CM | POA: Diagnosis not present

## 2021-01-10 DIAGNOSIS — J9611 Chronic respiratory failure with hypoxia: Secondary | ICD-10-CM | POA: Diagnosis not present

## 2021-01-10 DIAGNOSIS — F0150 Vascular dementia without behavioral disturbance: Secondary | ICD-10-CM | POA: Diagnosis not present

## 2021-01-10 DIAGNOSIS — I5032 Chronic diastolic (congestive) heart failure: Secondary | ICD-10-CM | POA: Diagnosis not present

## 2021-01-10 DIAGNOSIS — F419 Anxiety disorder, unspecified: Secondary | ICD-10-CM | POA: Diagnosis not present

## 2021-01-10 DIAGNOSIS — I25119 Atherosclerotic heart disease of native coronary artery with unspecified angina pectoris: Secondary | ICD-10-CM | POA: Diagnosis not present

## 2021-01-14 DIAGNOSIS — I25119 Atherosclerotic heart disease of native coronary artery with unspecified angina pectoris: Secondary | ICD-10-CM | POA: Diagnosis not present

## 2021-01-14 DIAGNOSIS — J9611 Chronic respiratory failure with hypoxia: Secondary | ICD-10-CM | POA: Diagnosis not present

## 2021-01-14 DIAGNOSIS — I679 Cerebrovascular disease, unspecified: Secondary | ICD-10-CM | POA: Diagnosis not present

## 2021-01-14 DIAGNOSIS — F0150 Vascular dementia without behavioral disturbance: Secondary | ICD-10-CM | POA: Diagnosis not present

## 2021-01-14 DIAGNOSIS — F419 Anxiety disorder, unspecified: Secondary | ICD-10-CM | POA: Diagnosis not present

## 2021-01-14 DIAGNOSIS — I5032 Chronic diastolic (congestive) heart failure: Secondary | ICD-10-CM | POA: Diagnosis not present

## 2021-01-15 DIAGNOSIS — J9611 Chronic respiratory failure with hypoxia: Secondary | ICD-10-CM | POA: Diagnosis not present

## 2021-01-15 DIAGNOSIS — I679 Cerebrovascular disease, unspecified: Secondary | ICD-10-CM | POA: Diagnosis not present

## 2021-01-15 DIAGNOSIS — I25119 Atherosclerotic heart disease of native coronary artery with unspecified angina pectoris: Secondary | ICD-10-CM | POA: Diagnosis not present

## 2021-01-15 DIAGNOSIS — F419 Anxiety disorder, unspecified: Secondary | ICD-10-CM | POA: Diagnosis not present

## 2021-01-15 DIAGNOSIS — F0150 Vascular dementia without behavioral disturbance: Secondary | ICD-10-CM | POA: Diagnosis not present

## 2021-01-15 DIAGNOSIS — I5032 Chronic diastolic (congestive) heart failure: Secondary | ICD-10-CM | POA: Diagnosis not present

## 2021-01-16 DIAGNOSIS — F419 Anxiety disorder, unspecified: Secondary | ICD-10-CM | POA: Diagnosis not present

## 2021-01-16 DIAGNOSIS — I679 Cerebrovascular disease, unspecified: Secondary | ICD-10-CM | POA: Diagnosis not present

## 2021-01-16 DIAGNOSIS — I25119 Atherosclerotic heart disease of native coronary artery with unspecified angina pectoris: Secondary | ICD-10-CM | POA: Diagnosis not present

## 2021-01-16 DIAGNOSIS — J9611 Chronic respiratory failure with hypoxia: Secondary | ICD-10-CM | POA: Diagnosis not present

## 2021-01-16 DIAGNOSIS — I5032 Chronic diastolic (congestive) heart failure: Secondary | ICD-10-CM | POA: Diagnosis not present

## 2021-01-16 DIAGNOSIS — F0150 Vascular dementia without behavioral disturbance: Secondary | ICD-10-CM | POA: Diagnosis not present

## 2021-01-21 DIAGNOSIS — F0150 Vascular dementia without behavioral disturbance: Secondary | ICD-10-CM | POA: Diagnosis not present

## 2021-01-21 DIAGNOSIS — I679 Cerebrovascular disease, unspecified: Secondary | ICD-10-CM | POA: Diagnosis not present

## 2021-01-21 DIAGNOSIS — F419 Anxiety disorder, unspecified: Secondary | ICD-10-CM | POA: Diagnosis not present

## 2021-01-21 DIAGNOSIS — J9611 Chronic respiratory failure with hypoxia: Secondary | ICD-10-CM | POA: Diagnosis not present

## 2021-01-21 DIAGNOSIS — I25119 Atherosclerotic heart disease of native coronary artery with unspecified angina pectoris: Secondary | ICD-10-CM | POA: Diagnosis not present

## 2021-01-21 DIAGNOSIS — I5032 Chronic diastolic (congestive) heart failure: Secondary | ICD-10-CM | POA: Diagnosis not present

## 2021-01-23 DIAGNOSIS — I25119 Atherosclerotic heart disease of native coronary artery with unspecified angina pectoris: Secondary | ICD-10-CM | POA: Diagnosis not present

## 2021-01-23 DIAGNOSIS — F419 Anxiety disorder, unspecified: Secondary | ICD-10-CM | POA: Diagnosis not present

## 2021-01-23 DIAGNOSIS — J9610 Chronic respiratory failure, unspecified whether with hypoxia or hypercapnia: Secondary | ICD-10-CM | POA: Diagnosis not present

## 2021-01-23 DIAGNOSIS — K5900 Constipation, unspecified: Secondary | ICD-10-CM | POA: Diagnosis not present

## 2021-01-23 DIAGNOSIS — Z872 Personal history of diseases of the skin and subcutaneous tissue: Secondary | ICD-10-CM | POA: Diagnosis not present

## 2021-01-23 DIAGNOSIS — I10 Essential (primary) hypertension: Secondary | ICD-10-CM | POA: Diagnosis not present

## 2021-01-23 DIAGNOSIS — F0150 Vascular dementia without behavioral disturbance: Secondary | ICD-10-CM | POA: Diagnosis not present

## 2021-01-23 DIAGNOSIS — Z8673 Personal history of transient ischemic attack (TIA), and cerebral infarction without residual deficits: Secondary | ICD-10-CM | POA: Diagnosis not present

## 2021-01-23 DIAGNOSIS — J449 Chronic obstructive pulmonary disease, unspecified: Secondary | ICD-10-CM | POA: Diagnosis not present

## 2021-01-23 DIAGNOSIS — L89622 Pressure ulcer of left heel, stage 2: Secondary | ICD-10-CM | POA: Diagnosis not present

## 2021-01-23 DIAGNOSIS — I2510 Atherosclerotic heart disease of native coronary artery without angina pectoris: Secondary | ICD-10-CM | POA: Diagnosis not present

## 2021-01-23 DIAGNOSIS — G4700 Insomnia, unspecified: Secondary | ICD-10-CM | POA: Diagnosis not present

## 2021-01-23 DIAGNOSIS — Z9981 Dependence on supplemental oxygen: Secondary | ICD-10-CM | POA: Diagnosis not present

## 2021-01-23 DIAGNOSIS — Z8616 Personal history of COVID-19: Secondary | ICD-10-CM | POA: Diagnosis not present

## 2021-01-23 DIAGNOSIS — Z87440 Personal history of urinary (tract) infections: Secondary | ICD-10-CM | POA: Diagnosis not present

## 2021-01-23 DIAGNOSIS — Z6824 Body mass index (BMI) 24.0-24.9, adult: Secondary | ICD-10-CM | POA: Diagnosis not present

## 2021-01-23 DIAGNOSIS — E039 Hypothyroidism, unspecified: Secondary | ICD-10-CM | POA: Diagnosis not present

## 2021-01-23 DIAGNOSIS — J9611 Chronic respiratory failure with hypoxia: Secondary | ICD-10-CM | POA: Diagnosis not present

## 2021-01-23 DIAGNOSIS — I5032 Chronic diastolic (congestive) heart failure: Secondary | ICD-10-CM | POA: Diagnosis not present

## 2021-01-23 DIAGNOSIS — H04129 Dry eye syndrome of unspecified lacrimal gland: Secondary | ICD-10-CM | POA: Diagnosis not present

## 2021-01-23 DIAGNOSIS — I679 Cerebrovascular disease, unspecified: Secondary | ICD-10-CM | POA: Diagnosis not present

## 2021-01-23 DIAGNOSIS — R32 Unspecified urinary incontinence: Secondary | ICD-10-CM | POA: Diagnosis not present

## 2021-01-23 DIAGNOSIS — I77810 Thoracic aortic ectasia: Secondary | ICD-10-CM | POA: Diagnosis not present

## 2021-01-23 DIAGNOSIS — R159 Full incontinence of feces: Secondary | ICD-10-CM | POA: Diagnosis not present

## 2021-01-23 DIAGNOSIS — Z8781 Personal history of (healed) traumatic fracture: Secondary | ICD-10-CM | POA: Diagnosis not present

## 2021-01-23 DIAGNOSIS — Z741 Need for assistance with personal care: Secondary | ICD-10-CM | POA: Diagnosis not present

## 2021-01-23 DIAGNOSIS — E440 Moderate protein-calorie malnutrition: Secondary | ICD-10-CM | POA: Diagnosis not present

## 2021-01-27 DIAGNOSIS — I25119 Atherosclerotic heart disease of native coronary artery with unspecified angina pectoris: Secondary | ICD-10-CM | POA: Diagnosis not present

## 2021-01-27 DIAGNOSIS — F0150 Vascular dementia without behavioral disturbance: Secondary | ICD-10-CM | POA: Diagnosis not present

## 2021-01-27 DIAGNOSIS — I679 Cerebrovascular disease, unspecified: Secondary | ICD-10-CM | POA: Diagnosis not present

## 2021-01-27 DIAGNOSIS — I5032 Chronic diastolic (congestive) heart failure: Secondary | ICD-10-CM | POA: Diagnosis not present

## 2021-01-27 DIAGNOSIS — J9611 Chronic respiratory failure with hypoxia: Secondary | ICD-10-CM | POA: Diagnosis not present

## 2021-01-27 DIAGNOSIS — F419 Anxiety disorder, unspecified: Secondary | ICD-10-CM | POA: Diagnosis not present

## 2021-01-28 DIAGNOSIS — F419 Anxiety disorder, unspecified: Secondary | ICD-10-CM | POA: Diagnosis not present

## 2021-01-28 DIAGNOSIS — I2510 Atherosclerotic heart disease of native coronary artery without angina pectoris: Secondary | ICD-10-CM | POA: Diagnosis not present

## 2021-01-28 DIAGNOSIS — I5032 Chronic diastolic (congestive) heart failure: Secondary | ICD-10-CM | POA: Diagnosis not present

## 2021-01-28 DIAGNOSIS — F0150 Vascular dementia without behavioral disturbance: Secondary | ICD-10-CM | POA: Diagnosis not present

## 2021-01-28 DIAGNOSIS — I25119 Atherosclerotic heart disease of native coronary artery with unspecified angina pectoris: Secondary | ICD-10-CM | POA: Diagnosis not present

## 2021-01-28 DIAGNOSIS — I10 Essential (primary) hypertension: Secondary | ICD-10-CM | POA: Diagnosis not present

## 2021-01-28 DIAGNOSIS — J9611 Chronic respiratory failure with hypoxia: Secondary | ICD-10-CM | POA: Diagnosis not present

## 2021-01-28 DIAGNOSIS — I679 Cerebrovascular disease, unspecified: Secondary | ICD-10-CM | POA: Diagnosis not present

## 2021-01-29 DIAGNOSIS — J9611 Chronic respiratory failure with hypoxia: Secondary | ICD-10-CM | POA: Diagnosis not present

## 2021-01-29 DIAGNOSIS — F419 Anxiety disorder, unspecified: Secondary | ICD-10-CM | POA: Diagnosis not present

## 2021-01-29 DIAGNOSIS — I25119 Atherosclerotic heart disease of native coronary artery with unspecified angina pectoris: Secondary | ICD-10-CM | POA: Diagnosis not present

## 2021-01-29 DIAGNOSIS — I679 Cerebrovascular disease, unspecified: Secondary | ICD-10-CM | POA: Diagnosis not present

## 2021-01-29 DIAGNOSIS — F0150 Vascular dementia without behavioral disturbance: Secondary | ICD-10-CM | POA: Diagnosis not present

## 2021-01-29 DIAGNOSIS — I5032 Chronic diastolic (congestive) heart failure: Secondary | ICD-10-CM | POA: Diagnosis not present

## 2021-01-30 DIAGNOSIS — I25119 Atherosclerotic heart disease of native coronary artery with unspecified angina pectoris: Secondary | ICD-10-CM | POA: Diagnosis not present

## 2021-01-30 DIAGNOSIS — F419 Anxiety disorder, unspecified: Secondary | ICD-10-CM | POA: Diagnosis not present

## 2021-01-30 DIAGNOSIS — I679 Cerebrovascular disease, unspecified: Secondary | ICD-10-CM | POA: Diagnosis not present

## 2021-01-30 DIAGNOSIS — J9611 Chronic respiratory failure with hypoxia: Secondary | ICD-10-CM | POA: Diagnosis not present

## 2021-01-30 DIAGNOSIS — F0150 Vascular dementia without behavioral disturbance: Secondary | ICD-10-CM | POA: Diagnosis not present

## 2021-01-30 DIAGNOSIS — I5032 Chronic diastolic (congestive) heart failure: Secondary | ICD-10-CM | POA: Diagnosis not present

## 2021-02-04 DIAGNOSIS — F419 Anxiety disorder, unspecified: Secondary | ICD-10-CM | POA: Diagnosis not present

## 2021-02-04 DIAGNOSIS — I25119 Atherosclerotic heart disease of native coronary artery with unspecified angina pectoris: Secondary | ICD-10-CM | POA: Diagnosis not present

## 2021-02-04 DIAGNOSIS — I679 Cerebrovascular disease, unspecified: Secondary | ICD-10-CM | POA: Diagnosis not present

## 2021-02-04 DIAGNOSIS — I5032 Chronic diastolic (congestive) heart failure: Secondary | ICD-10-CM | POA: Diagnosis not present

## 2021-02-04 DIAGNOSIS — F0150 Vascular dementia without behavioral disturbance: Secondary | ICD-10-CM | POA: Diagnosis not present

## 2021-02-04 DIAGNOSIS — J9611 Chronic respiratory failure with hypoxia: Secondary | ICD-10-CM | POA: Diagnosis not present

## 2021-02-06 DIAGNOSIS — I5032 Chronic diastolic (congestive) heart failure: Secondary | ICD-10-CM | POA: Diagnosis not present

## 2021-02-06 DIAGNOSIS — I25119 Atherosclerotic heart disease of native coronary artery with unspecified angina pectoris: Secondary | ICD-10-CM | POA: Diagnosis not present

## 2021-02-06 DIAGNOSIS — I679 Cerebrovascular disease, unspecified: Secondary | ICD-10-CM | POA: Diagnosis not present

## 2021-02-06 DIAGNOSIS — F0150 Vascular dementia without behavioral disturbance: Secondary | ICD-10-CM | POA: Diagnosis not present

## 2021-02-06 DIAGNOSIS — L89620 Pressure ulcer of left heel, unstageable: Secondary | ICD-10-CM | POA: Diagnosis not present

## 2021-02-06 DIAGNOSIS — J9611 Chronic respiratory failure with hypoxia: Secondary | ICD-10-CM | POA: Diagnosis not present

## 2021-02-06 DIAGNOSIS — F419 Anxiety disorder, unspecified: Secondary | ICD-10-CM | POA: Diagnosis not present

## 2021-02-10 DIAGNOSIS — I5032 Chronic diastolic (congestive) heart failure: Secondary | ICD-10-CM | POA: Diagnosis not present

## 2021-02-10 DIAGNOSIS — F419 Anxiety disorder, unspecified: Secondary | ICD-10-CM | POA: Diagnosis not present

## 2021-02-10 DIAGNOSIS — I2510 Atherosclerotic heart disease of native coronary artery without angina pectoris: Secondary | ICD-10-CM | POA: Diagnosis not present

## 2021-02-10 DIAGNOSIS — K5900 Constipation, unspecified: Secondary | ICD-10-CM | POA: Diagnosis not present

## 2021-02-10 DIAGNOSIS — F32A Depression, unspecified: Secondary | ICD-10-CM | POA: Diagnosis not present

## 2021-02-10 DIAGNOSIS — J9610 Chronic respiratory failure, unspecified whether with hypoxia or hypercapnia: Secondary | ICD-10-CM | POA: Diagnosis not present

## 2021-02-10 DIAGNOSIS — G4700 Insomnia, unspecified: Secondary | ICD-10-CM | POA: Diagnosis not present

## 2021-02-10 DIAGNOSIS — I10 Essential (primary) hypertension: Secondary | ICD-10-CM | POA: Diagnosis not present

## 2021-02-10 DIAGNOSIS — E039 Hypothyroidism, unspecified: Secondary | ICD-10-CM | POA: Diagnosis not present

## 2021-02-11 DIAGNOSIS — I25119 Atherosclerotic heart disease of native coronary artery with unspecified angina pectoris: Secondary | ICD-10-CM | POA: Diagnosis not present

## 2021-02-11 DIAGNOSIS — I5032 Chronic diastolic (congestive) heart failure: Secondary | ICD-10-CM | POA: Diagnosis not present

## 2021-02-11 DIAGNOSIS — F0150 Vascular dementia without behavioral disturbance: Secondary | ICD-10-CM | POA: Diagnosis not present

## 2021-02-11 DIAGNOSIS — F419 Anxiety disorder, unspecified: Secondary | ICD-10-CM | POA: Diagnosis not present

## 2021-02-11 DIAGNOSIS — I679 Cerebrovascular disease, unspecified: Secondary | ICD-10-CM | POA: Diagnosis not present

## 2021-02-11 DIAGNOSIS — J9611 Chronic respiratory failure with hypoxia: Secondary | ICD-10-CM | POA: Diagnosis not present

## 2021-02-13 DIAGNOSIS — J9611 Chronic respiratory failure with hypoxia: Secondary | ICD-10-CM | POA: Diagnosis not present

## 2021-02-13 DIAGNOSIS — L89620 Pressure ulcer of left heel, unstageable: Secondary | ICD-10-CM | POA: Diagnosis not present

## 2021-02-13 DIAGNOSIS — I5032 Chronic diastolic (congestive) heart failure: Secondary | ICD-10-CM | POA: Diagnosis not present

## 2021-02-13 DIAGNOSIS — I25119 Atherosclerotic heart disease of native coronary artery with unspecified angina pectoris: Secondary | ICD-10-CM | POA: Diagnosis not present

## 2021-02-13 DIAGNOSIS — F0150 Vascular dementia without behavioral disturbance: Secondary | ICD-10-CM | POA: Diagnosis not present

## 2021-02-13 DIAGNOSIS — I679 Cerebrovascular disease, unspecified: Secondary | ICD-10-CM | POA: Diagnosis not present

## 2021-02-13 DIAGNOSIS — F419 Anxiety disorder, unspecified: Secondary | ICD-10-CM | POA: Diagnosis not present

## 2021-02-18 DIAGNOSIS — F419 Anxiety disorder, unspecified: Secondary | ICD-10-CM | POA: Diagnosis not present

## 2021-02-18 DIAGNOSIS — J9611 Chronic respiratory failure with hypoxia: Secondary | ICD-10-CM | POA: Diagnosis not present

## 2021-02-18 DIAGNOSIS — I25119 Atherosclerotic heart disease of native coronary artery with unspecified angina pectoris: Secondary | ICD-10-CM | POA: Diagnosis not present

## 2021-02-18 DIAGNOSIS — I5032 Chronic diastolic (congestive) heart failure: Secondary | ICD-10-CM | POA: Diagnosis not present

## 2021-02-18 DIAGNOSIS — I679 Cerebrovascular disease, unspecified: Secondary | ICD-10-CM | POA: Diagnosis not present

## 2021-02-18 DIAGNOSIS — F0150 Vascular dementia without behavioral disturbance: Secondary | ICD-10-CM | POA: Diagnosis not present

## 2021-02-20 DIAGNOSIS — J9611 Chronic respiratory failure with hypoxia: Secondary | ICD-10-CM | POA: Diagnosis not present

## 2021-02-20 DIAGNOSIS — R600 Localized edema: Secondary | ICD-10-CM | POA: Diagnosis not present

## 2021-02-20 DIAGNOSIS — B351 Tinea unguium: Secondary | ICD-10-CM | POA: Diagnosis not present

## 2021-02-20 DIAGNOSIS — I7091 Generalized atherosclerosis: Secondary | ICD-10-CM | POA: Diagnosis not present

## 2021-02-20 DIAGNOSIS — F0150 Vascular dementia without behavioral disturbance: Secondary | ICD-10-CM | POA: Diagnosis not present

## 2021-02-20 DIAGNOSIS — I5032 Chronic diastolic (congestive) heart failure: Secondary | ICD-10-CM | POA: Diagnosis not present

## 2021-02-20 DIAGNOSIS — F419 Anxiety disorder, unspecified: Secondary | ICD-10-CM | POA: Diagnosis not present

## 2021-02-20 DIAGNOSIS — I25119 Atherosclerotic heart disease of native coronary artery with unspecified angina pectoris: Secondary | ICD-10-CM | POA: Diagnosis not present

## 2021-02-20 DIAGNOSIS — I679 Cerebrovascular disease, unspecified: Secondary | ICD-10-CM | POA: Diagnosis not present

## 2021-02-21 DIAGNOSIS — I5032 Chronic diastolic (congestive) heart failure: Secondary | ICD-10-CM | POA: Diagnosis not present

## 2021-02-21 DIAGNOSIS — I679 Cerebrovascular disease, unspecified: Secondary | ICD-10-CM | POA: Diagnosis not present

## 2021-02-21 DIAGNOSIS — J9611 Chronic respiratory failure with hypoxia: Secondary | ICD-10-CM | POA: Diagnosis not present

## 2021-02-21 DIAGNOSIS — L89620 Pressure ulcer of left heel, unstageable: Secondary | ICD-10-CM | POA: Diagnosis not present

## 2021-02-21 DIAGNOSIS — F419 Anxiety disorder, unspecified: Secondary | ICD-10-CM | POA: Diagnosis not present

## 2021-02-21 DIAGNOSIS — I25119 Atherosclerotic heart disease of native coronary artery with unspecified angina pectoris: Secondary | ICD-10-CM | POA: Diagnosis not present

## 2021-02-21 DIAGNOSIS — F0150 Vascular dementia without behavioral disturbance: Secondary | ICD-10-CM | POA: Diagnosis not present

## 2021-02-22 DIAGNOSIS — Z6821 Body mass index (BMI) 21.0-21.9, adult: Secondary | ICD-10-CM | POA: Diagnosis not present

## 2021-02-22 DIAGNOSIS — F0150 Vascular dementia without behavioral disturbance: Secondary | ICD-10-CM | POA: Diagnosis not present

## 2021-02-22 DIAGNOSIS — Z8781 Personal history of (healed) traumatic fracture: Secondary | ICD-10-CM | POA: Diagnosis not present

## 2021-02-22 DIAGNOSIS — J9611 Chronic respiratory failure with hypoxia: Secondary | ICD-10-CM | POA: Diagnosis not present

## 2021-02-22 DIAGNOSIS — Z8616 Personal history of COVID-19: Secondary | ICD-10-CM | POA: Diagnosis not present

## 2021-02-22 DIAGNOSIS — E039 Hypothyroidism, unspecified: Secondary | ICD-10-CM | POA: Diagnosis not present

## 2021-02-22 DIAGNOSIS — Z9981 Dependence on supplemental oxygen: Secondary | ICD-10-CM | POA: Diagnosis not present

## 2021-02-22 DIAGNOSIS — I77810 Thoracic aortic ectasia: Secondary | ICD-10-CM | POA: Diagnosis not present

## 2021-02-22 DIAGNOSIS — E440 Moderate protein-calorie malnutrition: Secondary | ICD-10-CM | POA: Diagnosis not present

## 2021-02-22 DIAGNOSIS — Z872 Personal history of diseases of the skin and subcutaneous tissue: Secondary | ICD-10-CM | POA: Diagnosis not present

## 2021-02-22 DIAGNOSIS — Z87440 Personal history of urinary (tract) infections: Secondary | ICD-10-CM | POA: Diagnosis not present

## 2021-02-22 DIAGNOSIS — F419 Anxiety disorder, unspecified: Secondary | ICD-10-CM | POA: Diagnosis not present

## 2021-02-22 DIAGNOSIS — I679 Cerebrovascular disease, unspecified: Secondary | ICD-10-CM | POA: Diagnosis not present

## 2021-02-22 DIAGNOSIS — I25119 Atherosclerotic heart disease of native coronary artery with unspecified angina pectoris: Secondary | ICD-10-CM | POA: Diagnosis not present

## 2021-02-22 DIAGNOSIS — J449 Chronic obstructive pulmonary disease, unspecified: Secondary | ICD-10-CM | POA: Diagnosis not present

## 2021-02-22 DIAGNOSIS — Z741 Need for assistance with personal care: Secondary | ICD-10-CM | POA: Diagnosis not present

## 2021-02-22 DIAGNOSIS — R32 Unspecified urinary incontinence: Secondary | ICD-10-CM | POA: Diagnosis not present

## 2021-02-22 DIAGNOSIS — H04129 Dry eye syndrome of unspecified lacrimal gland: Secondary | ICD-10-CM | POA: Diagnosis not present

## 2021-02-22 DIAGNOSIS — I5032 Chronic diastolic (congestive) heart failure: Secondary | ICD-10-CM | POA: Diagnosis not present

## 2021-02-22 DIAGNOSIS — Z8673 Personal history of transient ischemic attack (TIA), and cerebral infarction without residual deficits: Secondary | ICD-10-CM | POA: Diagnosis not present

## 2021-02-22 DIAGNOSIS — L89622 Pressure ulcer of left heel, stage 2: Secondary | ICD-10-CM | POA: Diagnosis not present

## 2021-02-22 DIAGNOSIS — R159 Full incontinence of feces: Secondary | ICD-10-CM | POA: Diagnosis not present

## 2021-02-24 DIAGNOSIS — F419 Anxiety disorder, unspecified: Secondary | ICD-10-CM | POA: Diagnosis not present

## 2021-02-24 DIAGNOSIS — I679 Cerebrovascular disease, unspecified: Secondary | ICD-10-CM | POA: Diagnosis not present

## 2021-02-24 DIAGNOSIS — F0150 Vascular dementia without behavioral disturbance: Secondary | ICD-10-CM | POA: Diagnosis not present

## 2021-02-24 DIAGNOSIS — J9611 Chronic respiratory failure with hypoxia: Secondary | ICD-10-CM | POA: Diagnosis not present

## 2021-02-24 DIAGNOSIS — I25119 Atherosclerotic heart disease of native coronary artery with unspecified angina pectoris: Secondary | ICD-10-CM | POA: Diagnosis not present

## 2021-02-24 DIAGNOSIS — I5032 Chronic diastolic (congestive) heart failure: Secondary | ICD-10-CM | POA: Diagnosis not present

## 2021-02-25 DIAGNOSIS — I5032 Chronic diastolic (congestive) heart failure: Secondary | ICD-10-CM | POA: Diagnosis not present

## 2021-02-25 DIAGNOSIS — J9611 Chronic respiratory failure with hypoxia: Secondary | ICD-10-CM | POA: Diagnosis not present

## 2021-02-25 DIAGNOSIS — I679 Cerebrovascular disease, unspecified: Secondary | ICD-10-CM | POA: Diagnosis not present

## 2021-02-25 DIAGNOSIS — F419 Anxiety disorder, unspecified: Secondary | ICD-10-CM | POA: Diagnosis not present

## 2021-02-25 DIAGNOSIS — F0150 Vascular dementia without behavioral disturbance: Secondary | ICD-10-CM | POA: Diagnosis not present

## 2021-02-25 DIAGNOSIS — I25119 Atherosclerotic heart disease of native coronary artery with unspecified angina pectoris: Secondary | ICD-10-CM | POA: Diagnosis not present

## 2021-02-27 DIAGNOSIS — F32A Depression, unspecified: Secondary | ICD-10-CM | POA: Diagnosis not present

## 2021-02-27 DIAGNOSIS — K5900 Constipation, unspecified: Secondary | ICD-10-CM | POA: Diagnosis not present

## 2021-02-27 DIAGNOSIS — I5032 Chronic diastolic (congestive) heart failure: Secondary | ICD-10-CM | POA: Diagnosis not present

## 2021-02-27 DIAGNOSIS — J9610 Chronic respiratory failure, unspecified whether with hypoxia or hypercapnia: Secondary | ICD-10-CM | POA: Diagnosis not present

## 2021-02-27 DIAGNOSIS — J9611 Chronic respiratory failure with hypoxia: Secondary | ICD-10-CM | POA: Diagnosis not present

## 2021-02-27 DIAGNOSIS — F0150 Vascular dementia without behavioral disturbance: Secondary | ICD-10-CM | POA: Diagnosis not present

## 2021-02-27 DIAGNOSIS — I679 Cerebrovascular disease, unspecified: Secondary | ICD-10-CM | POA: Diagnosis not present

## 2021-02-27 DIAGNOSIS — E039 Hypothyroidism, unspecified: Secondary | ICD-10-CM | POA: Diagnosis not present

## 2021-02-27 DIAGNOSIS — I10 Essential (primary) hypertension: Secondary | ICD-10-CM | POA: Diagnosis not present

## 2021-02-27 DIAGNOSIS — I25119 Atherosclerotic heart disease of native coronary artery with unspecified angina pectoris: Secondary | ICD-10-CM | POA: Diagnosis not present

## 2021-02-27 DIAGNOSIS — I2510 Atherosclerotic heart disease of native coronary artery without angina pectoris: Secondary | ICD-10-CM | POA: Diagnosis not present

## 2021-02-27 DIAGNOSIS — F419 Anxiety disorder, unspecified: Secondary | ICD-10-CM | POA: Diagnosis not present

## 2021-02-27 DIAGNOSIS — L89620 Pressure ulcer of left heel, unstageable: Secondary | ICD-10-CM | POA: Diagnosis not present

## 2021-02-27 DIAGNOSIS — G4700 Insomnia, unspecified: Secondary | ICD-10-CM | POA: Diagnosis not present

## 2021-02-28 DIAGNOSIS — I679 Cerebrovascular disease, unspecified: Secondary | ICD-10-CM | POA: Diagnosis not present

## 2021-02-28 DIAGNOSIS — I25119 Atherosclerotic heart disease of native coronary artery with unspecified angina pectoris: Secondary | ICD-10-CM | POA: Diagnosis not present

## 2021-02-28 DIAGNOSIS — J9611 Chronic respiratory failure with hypoxia: Secondary | ICD-10-CM | POA: Diagnosis not present

## 2021-02-28 DIAGNOSIS — I5032 Chronic diastolic (congestive) heart failure: Secondary | ICD-10-CM | POA: Diagnosis not present

## 2021-02-28 DIAGNOSIS — F0150 Vascular dementia without behavioral disturbance: Secondary | ICD-10-CM | POA: Diagnosis not present

## 2021-02-28 DIAGNOSIS — F419 Anxiety disorder, unspecified: Secondary | ICD-10-CM | POA: Diagnosis not present

## 2021-03-04 DIAGNOSIS — J9611 Chronic respiratory failure with hypoxia: Secondary | ICD-10-CM | POA: Diagnosis not present

## 2021-03-04 DIAGNOSIS — F0150 Vascular dementia without behavioral disturbance: Secondary | ICD-10-CM | POA: Diagnosis not present

## 2021-03-04 DIAGNOSIS — L89620 Pressure ulcer of left heel, unstageable: Secondary | ICD-10-CM | POA: Diagnosis not present

## 2021-03-04 DIAGNOSIS — I5032 Chronic diastolic (congestive) heart failure: Secondary | ICD-10-CM | POA: Diagnosis not present

## 2021-03-04 DIAGNOSIS — I679 Cerebrovascular disease, unspecified: Secondary | ICD-10-CM | POA: Diagnosis not present

## 2021-03-04 DIAGNOSIS — I25119 Atherosclerotic heart disease of native coronary artery with unspecified angina pectoris: Secondary | ICD-10-CM | POA: Diagnosis not present

## 2021-03-04 DIAGNOSIS — F419 Anxiety disorder, unspecified: Secondary | ICD-10-CM | POA: Diagnosis not present

## 2021-03-06 DIAGNOSIS — J9611 Chronic respiratory failure with hypoxia: Secondary | ICD-10-CM | POA: Diagnosis not present

## 2021-03-06 DIAGNOSIS — I5032 Chronic diastolic (congestive) heart failure: Secondary | ICD-10-CM | POA: Diagnosis not present

## 2021-03-06 DIAGNOSIS — I679 Cerebrovascular disease, unspecified: Secondary | ICD-10-CM | POA: Diagnosis not present

## 2021-03-06 DIAGNOSIS — F419 Anxiety disorder, unspecified: Secondary | ICD-10-CM | POA: Diagnosis not present

## 2021-03-06 DIAGNOSIS — F0150 Vascular dementia without behavioral disturbance: Secondary | ICD-10-CM | POA: Diagnosis not present

## 2021-03-06 DIAGNOSIS — I25119 Atherosclerotic heart disease of native coronary artery with unspecified angina pectoris: Secondary | ICD-10-CM | POA: Diagnosis not present

## 2021-03-10 DIAGNOSIS — G4700 Insomnia, unspecified: Secondary | ICD-10-CM | POA: Diagnosis not present

## 2021-03-10 DIAGNOSIS — R519 Headache, unspecified: Secondary | ICD-10-CM | POA: Diagnosis not present

## 2021-03-10 DIAGNOSIS — J9610 Chronic respiratory failure, unspecified whether with hypoxia or hypercapnia: Secondary | ICD-10-CM | POA: Diagnosis not present

## 2021-03-10 DIAGNOSIS — E039 Hypothyroidism, unspecified: Secondary | ICD-10-CM | POA: Diagnosis not present

## 2021-03-10 DIAGNOSIS — I5032 Chronic diastolic (congestive) heart failure: Secondary | ICD-10-CM | POA: Diagnosis not present

## 2021-03-10 DIAGNOSIS — I2510 Atherosclerotic heart disease of native coronary artery without angina pectoris: Secondary | ICD-10-CM | POA: Diagnosis not present

## 2021-03-10 DIAGNOSIS — I10 Essential (primary) hypertension: Secondary | ICD-10-CM | POA: Diagnosis not present

## 2021-03-10 DIAGNOSIS — K5900 Constipation, unspecified: Secondary | ICD-10-CM | POA: Diagnosis not present

## 2021-03-11 DIAGNOSIS — I679 Cerebrovascular disease, unspecified: Secondary | ICD-10-CM | POA: Diagnosis not present

## 2021-03-11 DIAGNOSIS — J9611 Chronic respiratory failure with hypoxia: Secondary | ICD-10-CM | POA: Diagnosis not present

## 2021-03-11 DIAGNOSIS — F0150 Vascular dementia without behavioral disturbance: Secondary | ICD-10-CM | POA: Diagnosis not present

## 2021-03-11 DIAGNOSIS — I5032 Chronic diastolic (congestive) heart failure: Secondary | ICD-10-CM | POA: Diagnosis not present

## 2021-03-11 DIAGNOSIS — I25119 Atherosclerotic heart disease of native coronary artery with unspecified angina pectoris: Secondary | ICD-10-CM | POA: Diagnosis not present

## 2021-03-11 DIAGNOSIS — F419 Anxiety disorder, unspecified: Secondary | ICD-10-CM | POA: Diagnosis not present

## 2021-03-13 DIAGNOSIS — I679 Cerebrovascular disease, unspecified: Secondary | ICD-10-CM | POA: Diagnosis not present

## 2021-03-13 DIAGNOSIS — I25119 Atherosclerotic heart disease of native coronary artery with unspecified angina pectoris: Secondary | ICD-10-CM | POA: Diagnosis not present

## 2021-03-13 DIAGNOSIS — F419 Anxiety disorder, unspecified: Secondary | ICD-10-CM | POA: Diagnosis not present

## 2021-03-13 DIAGNOSIS — F0150 Vascular dementia without behavioral disturbance: Secondary | ICD-10-CM | POA: Diagnosis not present

## 2021-03-13 DIAGNOSIS — I5032 Chronic diastolic (congestive) heart failure: Secondary | ICD-10-CM | POA: Diagnosis not present

## 2021-03-13 DIAGNOSIS — J9611 Chronic respiratory failure with hypoxia: Secondary | ICD-10-CM | POA: Diagnosis not present

## 2021-03-18 DIAGNOSIS — F419 Anxiety disorder, unspecified: Secondary | ICD-10-CM | POA: Diagnosis not present

## 2021-03-18 DIAGNOSIS — J9611 Chronic respiratory failure with hypoxia: Secondary | ICD-10-CM | POA: Diagnosis not present

## 2021-03-18 DIAGNOSIS — L89623 Pressure ulcer of left heel, stage 3: Secondary | ICD-10-CM | POA: Diagnosis not present

## 2021-03-18 DIAGNOSIS — F0150 Vascular dementia without behavioral disturbance: Secondary | ICD-10-CM | POA: Diagnosis not present

## 2021-03-18 DIAGNOSIS — I679 Cerebrovascular disease, unspecified: Secondary | ICD-10-CM | POA: Diagnosis not present

## 2021-03-18 DIAGNOSIS — I5032 Chronic diastolic (congestive) heart failure: Secondary | ICD-10-CM | POA: Diagnosis not present

## 2021-03-18 DIAGNOSIS — I25119 Atherosclerotic heart disease of native coronary artery with unspecified angina pectoris: Secondary | ICD-10-CM | POA: Diagnosis not present

## 2021-03-20 DIAGNOSIS — I5032 Chronic diastolic (congestive) heart failure: Secondary | ICD-10-CM | POA: Diagnosis not present

## 2021-03-20 DIAGNOSIS — I679 Cerebrovascular disease, unspecified: Secondary | ICD-10-CM | POA: Diagnosis not present

## 2021-03-20 DIAGNOSIS — F419 Anxiety disorder, unspecified: Secondary | ICD-10-CM | POA: Diagnosis not present

## 2021-03-20 DIAGNOSIS — I25119 Atherosclerotic heart disease of native coronary artery with unspecified angina pectoris: Secondary | ICD-10-CM | POA: Diagnosis not present

## 2021-03-20 DIAGNOSIS — F0150 Vascular dementia without behavioral disturbance: Secondary | ICD-10-CM | POA: Diagnosis not present

## 2021-03-20 DIAGNOSIS — J9611 Chronic respiratory failure with hypoxia: Secondary | ICD-10-CM | POA: Diagnosis not present

## 2021-03-24 DIAGNOSIS — J9611 Chronic respiratory failure with hypoxia: Secondary | ICD-10-CM | POA: Diagnosis not present

## 2021-03-24 DIAGNOSIS — I25119 Atherosclerotic heart disease of native coronary artery with unspecified angina pectoris: Secondary | ICD-10-CM | POA: Diagnosis not present

## 2021-03-24 DIAGNOSIS — F419 Anxiety disorder, unspecified: Secondary | ICD-10-CM | POA: Diagnosis not present

## 2021-03-24 DIAGNOSIS — F0150 Vascular dementia without behavioral disturbance: Secondary | ICD-10-CM | POA: Diagnosis not present

## 2021-03-24 DIAGNOSIS — I679 Cerebrovascular disease, unspecified: Secondary | ICD-10-CM | POA: Diagnosis not present

## 2021-03-24 DIAGNOSIS — I5032 Chronic diastolic (congestive) heart failure: Secondary | ICD-10-CM | POA: Diagnosis not present

## 2021-03-25 DIAGNOSIS — I679 Cerebrovascular disease, unspecified: Secondary | ICD-10-CM | POA: Diagnosis not present

## 2021-03-25 DIAGNOSIS — J9611 Chronic respiratory failure with hypoxia: Secondary | ICD-10-CM | POA: Diagnosis not present

## 2021-03-25 DIAGNOSIS — Z6821 Body mass index (BMI) 21.0-21.9, adult: Secondary | ICD-10-CM | POA: Diagnosis not present

## 2021-03-25 DIAGNOSIS — I77810 Thoracic aortic ectasia: Secondary | ICD-10-CM | POA: Diagnosis not present

## 2021-03-25 DIAGNOSIS — J449 Chronic obstructive pulmonary disease, unspecified: Secondary | ICD-10-CM | POA: Diagnosis not present

## 2021-03-25 DIAGNOSIS — Z8616 Personal history of COVID-19: Secondary | ICD-10-CM | POA: Diagnosis not present

## 2021-03-25 DIAGNOSIS — Z87440 Personal history of urinary (tract) infections: Secondary | ICD-10-CM | POA: Diagnosis not present

## 2021-03-25 DIAGNOSIS — L89622 Pressure ulcer of left heel, stage 2: Secondary | ICD-10-CM | POA: Diagnosis not present

## 2021-03-25 DIAGNOSIS — Z872 Personal history of diseases of the skin and subcutaneous tissue: Secondary | ICD-10-CM | POA: Diagnosis not present

## 2021-03-25 DIAGNOSIS — I5032 Chronic diastolic (congestive) heart failure: Secondary | ICD-10-CM | POA: Diagnosis not present

## 2021-03-25 DIAGNOSIS — H04129 Dry eye syndrome of unspecified lacrimal gland: Secondary | ICD-10-CM | POA: Diagnosis not present

## 2021-03-25 DIAGNOSIS — Z8781 Personal history of (healed) traumatic fracture: Secondary | ICD-10-CM | POA: Diagnosis not present

## 2021-03-25 DIAGNOSIS — F0150 Vascular dementia without behavioral disturbance: Secondary | ICD-10-CM | POA: Diagnosis not present

## 2021-03-25 DIAGNOSIS — F419 Anxiety disorder, unspecified: Secondary | ICD-10-CM | POA: Diagnosis not present

## 2021-03-25 DIAGNOSIS — L89623 Pressure ulcer of left heel, stage 3: Secondary | ICD-10-CM | POA: Diagnosis not present

## 2021-03-25 DIAGNOSIS — Z9981 Dependence on supplemental oxygen: Secondary | ICD-10-CM | POA: Diagnosis not present

## 2021-03-25 DIAGNOSIS — R32 Unspecified urinary incontinence: Secondary | ICD-10-CM | POA: Diagnosis not present

## 2021-03-25 DIAGNOSIS — Z741 Need for assistance with personal care: Secondary | ICD-10-CM | POA: Diagnosis not present

## 2021-03-25 DIAGNOSIS — Z8673 Personal history of transient ischemic attack (TIA), and cerebral infarction without residual deficits: Secondary | ICD-10-CM | POA: Diagnosis not present

## 2021-03-25 DIAGNOSIS — R159 Full incontinence of feces: Secondary | ICD-10-CM | POA: Diagnosis not present

## 2021-03-25 DIAGNOSIS — E039 Hypothyroidism, unspecified: Secondary | ICD-10-CM | POA: Diagnosis not present

## 2021-03-25 DIAGNOSIS — I25119 Atherosclerotic heart disease of native coronary artery with unspecified angina pectoris: Secondary | ICD-10-CM | POA: Diagnosis not present

## 2021-03-25 DIAGNOSIS — E440 Moderate protein-calorie malnutrition: Secondary | ICD-10-CM | POA: Diagnosis not present

## 2021-03-27 DIAGNOSIS — J9610 Chronic respiratory failure, unspecified whether with hypoxia or hypercapnia: Secondary | ICD-10-CM | POA: Diagnosis not present

## 2021-03-27 DIAGNOSIS — I679 Cerebrovascular disease, unspecified: Secondary | ICD-10-CM | POA: Diagnosis not present

## 2021-03-27 DIAGNOSIS — R519 Headache, unspecified: Secondary | ICD-10-CM | POA: Diagnosis not present

## 2021-03-27 DIAGNOSIS — F419 Anxiety disorder, unspecified: Secondary | ICD-10-CM | POA: Diagnosis not present

## 2021-03-27 DIAGNOSIS — F32A Depression, unspecified: Secondary | ICD-10-CM | POA: Diagnosis not present

## 2021-03-27 DIAGNOSIS — J9611 Chronic respiratory failure with hypoxia: Secondary | ICD-10-CM | POA: Diagnosis not present

## 2021-03-27 DIAGNOSIS — I25119 Atherosclerotic heart disease of native coronary artery with unspecified angina pectoris: Secondary | ICD-10-CM | POA: Diagnosis not present

## 2021-03-27 DIAGNOSIS — F0150 Vascular dementia without behavioral disturbance: Secondary | ICD-10-CM | POA: Diagnosis not present

## 2021-03-27 DIAGNOSIS — I5032 Chronic diastolic (congestive) heart failure: Secondary | ICD-10-CM | POA: Diagnosis not present

## 2021-03-27 DIAGNOSIS — G4700 Insomnia, unspecified: Secondary | ICD-10-CM | POA: Diagnosis not present

## 2021-03-27 DIAGNOSIS — E039 Hypothyroidism, unspecified: Secondary | ICD-10-CM | POA: Diagnosis not present

## 2021-03-27 DIAGNOSIS — I10 Essential (primary) hypertension: Secondary | ICD-10-CM | POA: Diagnosis not present

## 2021-03-27 DIAGNOSIS — I2510 Atherosclerotic heart disease of native coronary artery without angina pectoris: Secondary | ICD-10-CM | POA: Diagnosis not present

## 2021-03-27 DIAGNOSIS — K5900 Constipation, unspecified: Secondary | ICD-10-CM | POA: Diagnosis not present

## 2021-04-01 DIAGNOSIS — F419 Anxiety disorder, unspecified: Secondary | ICD-10-CM | POA: Diagnosis not present

## 2021-04-01 DIAGNOSIS — I5032 Chronic diastolic (congestive) heart failure: Secondary | ICD-10-CM | POA: Diagnosis not present

## 2021-04-01 DIAGNOSIS — J9611 Chronic respiratory failure with hypoxia: Secondary | ICD-10-CM | POA: Diagnosis not present

## 2021-04-01 DIAGNOSIS — F0150 Vascular dementia without behavioral disturbance: Secondary | ICD-10-CM | POA: Diagnosis not present

## 2021-04-01 DIAGNOSIS — I2510 Atherosclerotic heart disease of native coronary artery without angina pectoris: Secondary | ICD-10-CM | POA: Diagnosis not present

## 2021-04-01 DIAGNOSIS — I679 Cerebrovascular disease, unspecified: Secondary | ICD-10-CM | POA: Diagnosis not present

## 2021-04-01 DIAGNOSIS — I25119 Atherosclerotic heart disease of native coronary artery with unspecified angina pectoris: Secondary | ICD-10-CM | POA: Diagnosis not present

## 2021-04-01 DIAGNOSIS — I10 Essential (primary) hypertension: Secondary | ICD-10-CM | POA: Diagnosis not present

## 2021-04-01 DIAGNOSIS — L89623 Pressure ulcer of left heel, stage 3: Secondary | ICD-10-CM | POA: Diagnosis not present

## 2021-04-02 DIAGNOSIS — I679 Cerebrovascular disease, unspecified: Secondary | ICD-10-CM | POA: Diagnosis not present

## 2021-04-02 DIAGNOSIS — J9611 Chronic respiratory failure with hypoxia: Secondary | ICD-10-CM | POA: Diagnosis not present

## 2021-04-02 DIAGNOSIS — I25119 Atherosclerotic heart disease of native coronary artery with unspecified angina pectoris: Secondary | ICD-10-CM | POA: Diagnosis not present

## 2021-04-02 DIAGNOSIS — I5032 Chronic diastolic (congestive) heart failure: Secondary | ICD-10-CM | POA: Diagnosis not present

## 2021-04-02 DIAGNOSIS — F0150 Vascular dementia without behavioral disturbance: Secondary | ICD-10-CM | POA: Diagnosis not present

## 2021-04-02 DIAGNOSIS — F419 Anxiety disorder, unspecified: Secondary | ICD-10-CM | POA: Diagnosis not present

## 2021-04-03 DIAGNOSIS — I5032 Chronic diastolic (congestive) heart failure: Secondary | ICD-10-CM | POA: Diagnosis not present

## 2021-04-03 DIAGNOSIS — F0150 Vascular dementia without behavioral disturbance: Secondary | ICD-10-CM | POA: Diagnosis not present

## 2021-04-03 DIAGNOSIS — J9611 Chronic respiratory failure with hypoxia: Secondary | ICD-10-CM | POA: Diagnosis not present

## 2021-04-03 DIAGNOSIS — I679 Cerebrovascular disease, unspecified: Secondary | ICD-10-CM | POA: Diagnosis not present

## 2021-04-03 DIAGNOSIS — F419 Anxiety disorder, unspecified: Secondary | ICD-10-CM | POA: Diagnosis not present

## 2021-04-03 DIAGNOSIS — I25119 Atherosclerotic heart disease of native coronary artery with unspecified angina pectoris: Secondary | ICD-10-CM | POA: Diagnosis not present

## 2021-04-04 DIAGNOSIS — I679 Cerebrovascular disease, unspecified: Secondary | ICD-10-CM | POA: Diagnosis not present

## 2021-04-04 DIAGNOSIS — F419 Anxiety disorder, unspecified: Secondary | ICD-10-CM | POA: Diagnosis not present

## 2021-04-04 DIAGNOSIS — I5032 Chronic diastolic (congestive) heart failure: Secondary | ICD-10-CM | POA: Diagnosis not present

## 2021-04-04 DIAGNOSIS — J9611 Chronic respiratory failure with hypoxia: Secondary | ICD-10-CM | POA: Diagnosis not present

## 2021-04-04 DIAGNOSIS — F0150 Vascular dementia without behavioral disturbance: Secondary | ICD-10-CM | POA: Diagnosis not present

## 2021-04-04 DIAGNOSIS — I25119 Atherosclerotic heart disease of native coronary artery with unspecified angina pectoris: Secondary | ICD-10-CM | POA: Diagnosis not present

## 2021-04-07 DIAGNOSIS — E039 Hypothyroidism, unspecified: Secondary | ICD-10-CM | POA: Diagnosis not present

## 2021-04-07 DIAGNOSIS — G4700 Insomnia, unspecified: Secondary | ICD-10-CM | POA: Diagnosis not present

## 2021-04-07 DIAGNOSIS — J9610 Chronic respiratory failure, unspecified whether with hypoxia or hypercapnia: Secondary | ICD-10-CM | POA: Diagnosis not present

## 2021-04-07 DIAGNOSIS — I5032 Chronic diastolic (congestive) heart failure: Secondary | ICD-10-CM | POA: Diagnosis not present

## 2021-04-07 DIAGNOSIS — I2510 Atherosclerotic heart disease of native coronary artery without angina pectoris: Secondary | ICD-10-CM | POA: Diagnosis not present

## 2021-04-07 DIAGNOSIS — K5900 Constipation, unspecified: Secondary | ICD-10-CM | POA: Diagnosis not present

## 2021-04-07 DIAGNOSIS — I10 Essential (primary) hypertension: Secondary | ICD-10-CM | POA: Diagnosis not present

## 2021-04-08 DIAGNOSIS — I25119 Atherosclerotic heart disease of native coronary artery with unspecified angina pectoris: Secondary | ICD-10-CM | POA: Diagnosis not present

## 2021-04-08 DIAGNOSIS — F0150 Vascular dementia without behavioral disturbance: Secondary | ICD-10-CM | POA: Diagnosis not present

## 2021-04-08 DIAGNOSIS — J9611 Chronic respiratory failure with hypoxia: Secondary | ICD-10-CM | POA: Diagnosis not present

## 2021-04-08 DIAGNOSIS — M549 Dorsalgia, unspecified: Secondary | ICD-10-CM | POA: Diagnosis not present

## 2021-04-08 DIAGNOSIS — F419 Anxiety disorder, unspecified: Secondary | ICD-10-CM | POA: Diagnosis not present

## 2021-04-08 DIAGNOSIS — M545 Low back pain, unspecified: Secondary | ICD-10-CM | POA: Diagnosis not present

## 2021-04-08 DIAGNOSIS — I679 Cerebrovascular disease, unspecified: Secondary | ICD-10-CM | POA: Diagnosis not present

## 2021-04-08 DIAGNOSIS — I5032 Chronic diastolic (congestive) heart failure: Secondary | ICD-10-CM | POA: Diagnosis not present

## 2021-04-09 DIAGNOSIS — F419 Anxiety disorder, unspecified: Secondary | ICD-10-CM | POA: Diagnosis not present

## 2021-04-09 DIAGNOSIS — F0150 Vascular dementia without behavioral disturbance: Secondary | ICD-10-CM | POA: Diagnosis not present

## 2021-04-09 DIAGNOSIS — J9611 Chronic respiratory failure with hypoxia: Secondary | ICD-10-CM | POA: Diagnosis not present

## 2021-04-09 DIAGNOSIS — I679 Cerebrovascular disease, unspecified: Secondary | ICD-10-CM | POA: Diagnosis not present

## 2021-04-09 DIAGNOSIS — I5032 Chronic diastolic (congestive) heart failure: Secondary | ICD-10-CM | POA: Diagnosis not present

## 2021-04-09 DIAGNOSIS — I25119 Atherosclerotic heart disease of native coronary artery with unspecified angina pectoris: Secondary | ICD-10-CM | POA: Diagnosis not present

## 2021-04-10 DIAGNOSIS — I679 Cerebrovascular disease, unspecified: Secondary | ICD-10-CM | POA: Diagnosis not present

## 2021-04-10 DIAGNOSIS — F419 Anxiety disorder, unspecified: Secondary | ICD-10-CM | POA: Diagnosis not present

## 2021-04-10 DIAGNOSIS — I25119 Atherosclerotic heart disease of native coronary artery with unspecified angina pectoris: Secondary | ICD-10-CM | POA: Diagnosis not present

## 2021-04-10 DIAGNOSIS — J9611 Chronic respiratory failure with hypoxia: Secondary | ICD-10-CM | POA: Diagnosis not present

## 2021-04-10 DIAGNOSIS — F0150 Vascular dementia without behavioral disturbance: Secondary | ICD-10-CM | POA: Diagnosis not present

## 2021-04-10 DIAGNOSIS — I5032 Chronic diastolic (congestive) heart failure: Secondary | ICD-10-CM | POA: Diagnosis not present

## 2021-04-15 DIAGNOSIS — I679 Cerebrovascular disease, unspecified: Secondary | ICD-10-CM | POA: Diagnosis not present

## 2021-04-15 DIAGNOSIS — I25119 Atherosclerotic heart disease of native coronary artery with unspecified angina pectoris: Secondary | ICD-10-CM | POA: Diagnosis not present

## 2021-04-15 DIAGNOSIS — L89623 Pressure ulcer of left heel, stage 3: Secondary | ICD-10-CM | POA: Diagnosis not present

## 2021-04-15 DIAGNOSIS — F0150 Vascular dementia without behavioral disturbance: Secondary | ICD-10-CM | POA: Diagnosis not present

## 2021-04-15 DIAGNOSIS — J9611 Chronic respiratory failure with hypoxia: Secondary | ICD-10-CM | POA: Diagnosis not present

## 2021-04-15 DIAGNOSIS — I5032 Chronic diastolic (congestive) heart failure: Secondary | ICD-10-CM | POA: Diagnosis not present

## 2021-04-15 DIAGNOSIS — F419 Anxiety disorder, unspecified: Secondary | ICD-10-CM | POA: Diagnosis not present

## 2021-04-16 DIAGNOSIS — I679 Cerebrovascular disease, unspecified: Secondary | ICD-10-CM | POA: Diagnosis not present

## 2021-04-16 DIAGNOSIS — I5032 Chronic diastolic (congestive) heart failure: Secondary | ICD-10-CM | POA: Diagnosis not present

## 2021-04-16 DIAGNOSIS — F0150 Vascular dementia without behavioral disturbance: Secondary | ICD-10-CM | POA: Diagnosis not present

## 2021-04-16 DIAGNOSIS — I25119 Atherosclerotic heart disease of native coronary artery with unspecified angina pectoris: Secondary | ICD-10-CM | POA: Diagnosis not present

## 2021-04-16 DIAGNOSIS — J9611 Chronic respiratory failure with hypoxia: Secondary | ICD-10-CM | POA: Diagnosis not present

## 2021-04-16 DIAGNOSIS — F419 Anxiety disorder, unspecified: Secondary | ICD-10-CM | POA: Diagnosis not present

## 2021-04-17 DIAGNOSIS — J9611 Chronic respiratory failure with hypoxia: Secondary | ICD-10-CM | POA: Diagnosis not present

## 2021-04-17 DIAGNOSIS — F419 Anxiety disorder, unspecified: Secondary | ICD-10-CM | POA: Diagnosis not present

## 2021-04-17 DIAGNOSIS — F0150 Vascular dementia without behavioral disturbance: Secondary | ICD-10-CM | POA: Diagnosis not present

## 2021-04-17 DIAGNOSIS — I5032 Chronic diastolic (congestive) heart failure: Secondary | ICD-10-CM | POA: Diagnosis not present

## 2021-04-17 DIAGNOSIS — I25119 Atherosclerotic heart disease of native coronary artery with unspecified angina pectoris: Secondary | ICD-10-CM | POA: Diagnosis not present

## 2021-04-17 DIAGNOSIS — I679 Cerebrovascular disease, unspecified: Secondary | ICD-10-CM | POA: Diagnosis not present

## 2021-04-22 DIAGNOSIS — J9611 Chronic respiratory failure with hypoxia: Secondary | ICD-10-CM | POA: Diagnosis not present

## 2021-04-22 DIAGNOSIS — I679 Cerebrovascular disease, unspecified: Secondary | ICD-10-CM | POA: Diagnosis not present

## 2021-04-22 DIAGNOSIS — I5032 Chronic diastolic (congestive) heart failure: Secondary | ICD-10-CM | POA: Diagnosis not present

## 2021-04-22 DIAGNOSIS — L89623 Pressure ulcer of left heel, stage 3: Secondary | ICD-10-CM | POA: Diagnosis not present

## 2021-04-22 DIAGNOSIS — I25119 Atherosclerotic heart disease of native coronary artery with unspecified angina pectoris: Secondary | ICD-10-CM | POA: Diagnosis not present

## 2021-04-22 DIAGNOSIS — F419 Anxiety disorder, unspecified: Secondary | ICD-10-CM | POA: Diagnosis not present

## 2021-04-22 DIAGNOSIS — F0150 Vascular dementia without behavioral disturbance: Secondary | ICD-10-CM | POA: Diagnosis not present

## 2021-04-24 DIAGNOSIS — M549 Dorsalgia, unspecified: Secondary | ICD-10-CM | POA: Diagnosis not present

## 2021-04-24 DIAGNOSIS — E440 Moderate protein-calorie malnutrition: Secondary | ICD-10-CM | POA: Diagnosis not present

## 2021-04-24 DIAGNOSIS — Z8781 Personal history of (healed) traumatic fracture: Secondary | ICD-10-CM | POA: Diagnosis not present

## 2021-04-24 DIAGNOSIS — J449 Chronic obstructive pulmonary disease, unspecified: Secondary | ICD-10-CM | POA: Diagnosis not present

## 2021-04-24 DIAGNOSIS — I25119 Atherosclerotic heart disease of native coronary artery with unspecified angina pectoris: Secondary | ICD-10-CM | POA: Diagnosis not present

## 2021-04-24 DIAGNOSIS — I10 Essential (primary) hypertension: Secondary | ICD-10-CM | POA: Diagnosis not present

## 2021-04-24 DIAGNOSIS — Z87440 Personal history of urinary (tract) infections: Secondary | ICD-10-CM | POA: Diagnosis not present

## 2021-04-24 DIAGNOSIS — Z6820 Body mass index (BMI) 20.0-20.9, adult: Secondary | ICD-10-CM | POA: Diagnosis not present

## 2021-04-24 DIAGNOSIS — J9611 Chronic respiratory failure with hypoxia: Secondary | ICD-10-CM | POA: Diagnosis not present

## 2021-04-24 DIAGNOSIS — H04129 Dry eye syndrome of unspecified lacrimal gland: Secondary | ICD-10-CM | POA: Diagnosis not present

## 2021-04-24 DIAGNOSIS — R32 Unspecified urinary incontinence: Secondary | ICD-10-CM | POA: Diagnosis not present

## 2021-04-24 DIAGNOSIS — I5032 Chronic diastolic (congestive) heart failure: Secondary | ICD-10-CM | POA: Diagnosis not present

## 2021-04-24 DIAGNOSIS — Z8673 Personal history of transient ischemic attack (TIA), and cerebral infarction without residual deficits: Secondary | ICD-10-CM | POA: Diagnosis not present

## 2021-04-24 DIAGNOSIS — F419 Anxiety disorder, unspecified: Secondary | ICD-10-CM | POA: Diagnosis not present

## 2021-04-24 DIAGNOSIS — Z872 Personal history of diseases of the skin and subcutaneous tissue: Secondary | ICD-10-CM | POA: Diagnosis not present

## 2021-04-24 DIAGNOSIS — Z8616 Personal history of COVID-19: Secondary | ICD-10-CM | POA: Diagnosis not present

## 2021-04-24 DIAGNOSIS — I77810 Thoracic aortic ectasia: Secondary | ICD-10-CM | POA: Diagnosis not present

## 2021-04-24 DIAGNOSIS — E039 Hypothyroidism, unspecified: Secondary | ICD-10-CM | POA: Diagnosis not present

## 2021-04-24 DIAGNOSIS — L89622 Pressure ulcer of left heel, stage 2: Secondary | ICD-10-CM | POA: Diagnosis not present

## 2021-04-24 DIAGNOSIS — R159 Full incontinence of feces: Secondary | ICD-10-CM | POA: Diagnosis not present

## 2021-04-24 DIAGNOSIS — Z741 Need for assistance with personal care: Secondary | ICD-10-CM | POA: Diagnosis not present

## 2021-04-24 DIAGNOSIS — I679 Cerebrovascular disease, unspecified: Secondary | ICD-10-CM | POA: Diagnosis not present

## 2021-04-24 DIAGNOSIS — Z9981 Dependence on supplemental oxygen: Secondary | ICD-10-CM | POA: Diagnosis not present

## 2021-04-24 DIAGNOSIS — F0150 Vascular dementia without behavioral disturbance: Secondary | ICD-10-CM | POA: Diagnosis not present

## 2021-04-25 DIAGNOSIS — F0150 Vascular dementia without behavioral disturbance: Secondary | ICD-10-CM | POA: Diagnosis not present

## 2021-04-25 DIAGNOSIS — E039 Hypothyroidism, unspecified: Secondary | ICD-10-CM | POA: Diagnosis not present

## 2021-04-25 DIAGNOSIS — I10 Essential (primary) hypertension: Secondary | ICD-10-CM | POA: Diagnosis not present

## 2021-04-25 DIAGNOSIS — I2510 Atherosclerotic heart disease of native coronary artery without angina pectoris: Secondary | ICD-10-CM | POA: Diagnosis not present

## 2021-04-25 DIAGNOSIS — I5032 Chronic diastolic (congestive) heart failure: Secondary | ICD-10-CM | POA: Diagnosis not present

## 2021-04-29 DIAGNOSIS — I5032 Chronic diastolic (congestive) heart failure: Secondary | ICD-10-CM | POA: Diagnosis not present

## 2021-04-29 DIAGNOSIS — J9611 Chronic respiratory failure with hypoxia: Secondary | ICD-10-CM | POA: Diagnosis not present

## 2021-04-29 DIAGNOSIS — I25119 Atherosclerotic heart disease of native coronary artery with unspecified angina pectoris: Secondary | ICD-10-CM | POA: Diagnosis not present

## 2021-04-29 DIAGNOSIS — F0150 Vascular dementia without behavioral disturbance: Secondary | ICD-10-CM | POA: Diagnosis not present

## 2021-04-29 DIAGNOSIS — I679 Cerebrovascular disease, unspecified: Secondary | ICD-10-CM | POA: Diagnosis not present

## 2021-04-29 DIAGNOSIS — F419 Anxiety disorder, unspecified: Secondary | ICD-10-CM | POA: Diagnosis not present

## 2021-05-01 DIAGNOSIS — I25119 Atherosclerotic heart disease of native coronary artery with unspecified angina pectoris: Secondary | ICD-10-CM | POA: Diagnosis not present

## 2021-05-01 DIAGNOSIS — J9611 Chronic respiratory failure with hypoxia: Secondary | ICD-10-CM | POA: Diagnosis not present

## 2021-05-01 DIAGNOSIS — F419 Anxiety disorder, unspecified: Secondary | ICD-10-CM | POA: Diagnosis not present

## 2021-05-01 DIAGNOSIS — F0150 Vascular dementia without behavioral disturbance: Secondary | ICD-10-CM | POA: Diagnosis not present

## 2021-05-01 DIAGNOSIS — I679 Cerebrovascular disease, unspecified: Secondary | ICD-10-CM | POA: Diagnosis not present

## 2021-05-01 DIAGNOSIS — I5032 Chronic diastolic (congestive) heart failure: Secondary | ICD-10-CM | POA: Diagnosis not present

## 2021-05-05 DIAGNOSIS — I5032 Chronic diastolic (congestive) heart failure: Secondary | ICD-10-CM | POA: Diagnosis not present

## 2021-05-05 DIAGNOSIS — J9611 Chronic respiratory failure with hypoxia: Secondary | ICD-10-CM | POA: Diagnosis not present

## 2021-05-05 DIAGNOSIS — I10 Essential (primary) hypertension: Secondary | ICD-10-CM | POA: Diagnosis not present

## 2021-05-05 DIAGNOSIS — F0150 Vascular dementia without behavioral disturbance: Secondary | ICD-10-CM | POA: Diagnosis not present

## 2021-05-05 DIAGNOSIS — F419 Anxiety disorder, unspecified: Secondary | ICD-10-CM | POA: Diagnosis not present

## 2021-05-05 DIAGNOSIS — E039 Hypothyroidism, unspecified: Secondary | ICD-10-CM | POA: Diagnosis not present

## 2021-05-05 DIAGNOSIS — J9610 Chronic respiratory failure, unspecified whether with hypoxia or hypercapnia: Secondary | ICD-10-CM | POA: Diagnosis not present

## 2021-05-05 DIAGNOSIS — G4700 Insomnia, unspecified: Secondary | ICD-10-CM | POA: Diagnosis not present

## 2021-05-05 DIAGNOSIS — F32A Depression, unspecified: Secondary | ICD-10-CM | POA: Diagnosis not present

## 2021-05-05 DIAGNOSIS — I679 Cerebrovascular disease, unspecified: Secondary | ICD-10-CM | POA: Diagnosis not present

## 2021-05-05 DIAGNOSIS — I25119 Atherosclerotic heart disease of native coronary artery with unspecified angina pectoris: Secondary | ICD-10-CM | POA: Diagnosis not present

## 2021-05-06 DIAGNOSIS — J9611 Chronic respiratory failure with hypoxia: Secondary | ICD-10-CM | POA: Diagnosis not present

## 2021-05-06 DIAGNOSIS — F419 Anxiety disorder, unspecified: Secondary | ICD-10-CM | POA: Diagnosis not present

## 2021-05-06 DIAGNOSIS — F0150 Vascular dementia without behavioral disturbance: Secondary | ICD-10-CM | POA: Diagnosis not present

## 2021-05-06 DIAGNOSIS — I25119 Atherosclerotic heart disease of native coronary artery with unspecified angina pectoris: Secondary | ICD-10-CM | POA: Diagnosis not present

## 2021-05-06 DIAGNOSIS — I679 Cerebrovascular disease, unspecified: Secondary | ICD-10-CM | POA: Diagnosis not present

## 2021-05-06 DIAGNOSIS — I5032 Chronic diastolic (congestive) heart failure: Secondary | ICD-10-CM | POA: Diagnosis not present

## 2021-05-07 DIAGNOSIS — F419 Anxiety disorder, unspecified: Secondary | ICD-10-CM | POA: Diagnosis not present

## 2021-05-07 DIAGNOSIS — I679 Cerebrovascular disease, unspecified: Secondary | ICD-10-CM | POA: Diagnosis not present

## 2021-05-07 DIAGNOSIS — I5032 Chronic diastolic (congestive) heart failure: Secondary | ICD-10-CM | POA: Diagnosis not present

## 2021-05-07 DIAGNOSIS — F0150 Vascular dementia without behavioral disturbance: Secondary | ICD-10-CM | POA: Diagnosis not present

## 2021-05-07 DIAGNOSIS — I25119 Atherosclerotic heart disease of native coronary artery with unspecified angina pectoris: Secondary | ICD-10-CM | POA: Diagnosis not present

## 2021-05-07 DIAGNOSIS — J9611 Chronic respiratory failure with hypoxia: Secondary | ICD-10-CM | POA: Diagnosis not present

## 2021-05-08 DIAGNOSIS — I25119 Atherosclerotic heart disease of native coronary artery with unspecified angina pectoris: Secondary | ICD-10-CM | POA: Diagnosis not present

## 2021-05-08 DIAGNOSIS — F0150 Vascular dementia without behavioral disturbance: Secondary | ICD-10-CM | POA: Diagnosis not present

## 2021-05-08 DIAGNOSIS — J9611 Chronic respiratory failure with hypoxia: Secondary | ICD-10-CM | POA: Diagnosis not present

## 2021-05-08 DIAGNOSIS — F419 Anxiety disorder, unspecified: Secondary | ICD-10-CM | POA: Diagnosis not present

## 2021-05-08 DIAGNOSIS — I5032 Chronic diastolic (congestive) heart failure: Secondary | ICD-10-CM | POA: Diagnosis not present

## 2021-05-08 DIAGNOSIS — I679 Cerebrovascular disease, unspecified: Secondary | ICD-10-CM | POA: Diagnosis not present

## 2021-05-13 DIAGNOSIS — I679 Cerebrovascular disease, unspecified: Secondary | ICD-10-CM | POA: Diagnosis not present

## 2021-05-13 DIAGNOSIS — J9611 Chronic respiratory failure with hypoxia: Secondary | ICD-10-CM | POA: Diagnosis not present

## 2021-05-13 DIAGNOSIS — I25119 Atherosclerotic heart disease of native coronary artery with unspecified angina pectoris: Secondary | ICD-10-CM | POA: Diagnosis not present

## 2021-05-13 DIAGNOSIS — F419 Anxiety disorder, unspecified: Secondary | ICD-10-CM | POA: Diagnosis not present

## 2021-05-13 DIAGNOSIS — F0150 Vascular dementia without behavioral disturbance: Secondary | ICD-10-CM | POA: Diagnosis not present

## 2021-05-13 DIAGNOSIS — I5032 Chronic diastolic (congestive) heart failure: Secondary | ICD-10-CM | POA: Diagnosis not present

## 2021-05-14 DIAGNOSIS — I5032 Chronic diastolic (congestive) heart failure: Secondary | ICD-10-CM | POA: Diagnosis not present

## 2021-05-14 DIAGNOSIS — J9611 Chronic respiratory failure with hypoxia: Secondary | ICD-10-CM | POA: Diagnosis not present

## 2021-05-14 DIAGNOSIS — F419 Anxiety disorder, unspecified: Secondary | ICD-10-CM | POA: Diagnosis not present

## 2021-05-14 DIAGNOSIS — I679 Cerebrovascular disease, unspecified: Secondary | ICD-10-CM | POA: Diagnosis not present

## 2021-05-14 DIAGNOSIS — I25119 Atherosclerotic heart disease of native coronary artery with unspecified angina pectoris: Secondary | ICD-10-CM | POA: Diagnosis not present

## 2021-05-14 DIAGNOSIS — F0150 Vascular dementia without behavioral disturbance: Secondary | ICD-10-CM | POA: Diagnosis not present

## 2021-05-15 DIAGNOSIS — I5032 Chronic diastolic (congestive) heart failure: Secondary | ICD-10-CM | POA: Diagnosis not present

## 2021-05-15 DIAGNOSIS — I679 Cerebrovascular disease, unspecified: Secondary | ICD-10-CM | POA: Diagnosis not present

## 2021-05-15 DIAGNOSIS — F419 Anxiety disorder, unspecified: Secondary | ICD-10-CM | POA: Diagnosis not present

## 2021-05-15 DIAGNOSIS — F0150 Vascular dementia without behavioral disturbance: Secondary | ICD-10-CM | POA: Diagnosis not present

## 2021-05-15 DIAGNOSIS — J9611 Chronic respiratory failure with hypoxia: Secondary | ICD-10-CM | POA: Diagnosis not present

## 2021-05-15 DIAGNOSIS — I25119 Atherosclerotic heart disease of native coronary artery with unspecified angina pectoris: Secondary | ICD-10-CM | POA: Diagnosis not present

## 2021-05-20 DIAGNOSIS — I5032 Chronic diastolic (congestive) heart failure: Secondary | ICD-10-CM | POA: Diagnosis not present

## 2021-05-20 DIAGNOSIS — F419 Anxiety disorder, unspecified: Secondary | ICD-10-CM | POA: Diagnosis not present

## 2021-05-20 DIAGNOSIS — I25119 Atherosclerotic heart disease of native coronary artery with unspecified angina pectoris: Secondary | ICD-10-CM | POA: Diagnosis not present

## 2021-05-20 DIAGNOSIS — I679 Cerebrovascular disease, unspecified: Secondary | ICD-10-CM | POA: Diagnosis not present

## 2021-05-20 DIAGNOSIS — F0150 Vascular dementia without behavioral disturbance: Secondary | ICD-10-CM | POA: Diagnosis not present

## 2021-05-20 DIAGNOSIS — J9611 Chronic respiratory failure with hypoxia: Secondary | ICD-10-CM | POA: Diagnosis not present

## 2021-05-21 DIAGNOSIS — F0150 Vascular dementia without behavioral disturbance: Secondary | ICD-10-CM | POA: Diagnosis not present

## 2021-05-21 DIAGNOSIS — I25119 Atherosclerotic heart disease of native coronary artery with unspecified angina pectoris: Secondary | ICD-10-CM | POA: Diagnosis not present

## 2021-05-21 DIAGNOSIS — F419 Anxiety disorder, unspecified: Secondary | ICD-10-CM | POA: Diagnosis not present

## 2021-05-21 DIAGNOSIS — J9611 Chronic respiratory failure with hypoxia: Secondary | ICD-10-CM | POA: Diagnosis not present

## 2021-05-21 DIAGNOSIS — I679 Cerebrovascular disease, unspecified: Secondary | ICD-10-CM | POA: Diagnosis not present

## 2021-05-21 DIAGNOSIS — I5032 Chronic diastolic (congestive) heart failure: Secondary | ICD-10-CM | POA: Diagnosis not present

## 2021-05-22 DIAGNOSIS — I679 Cerebrovascular disease, unspecified: Secondary | ICD-10-CM | POA: Diagnosis not present

## 2021-05-22 DIAGNOSIS — I25119 Atherosclerotic heart disease of native coronary artery with unspecified angina pectoris: Secondary | ICD-10-CM | POA: Diagnosis not present

## 2021-05-22 DIAGNOSIS — J9610 Chronic respiratory failure, unspecified whether with hypoxia or hypercapnia: Secondary | ICD-10-CM | POA: Diagnosis not present

## 2021-05-22 DIAGNOSIS — K5900 Constipation, unspecified: Secondary | ICD-10-CM | POA: Diagnosis not present

## 2021-05-22 DIAGNOSIS — I10 Essential (primary) hypertension: Secondary | ICD-10-CM | POA: Diagnosis not present

## 2021-05-22 DIAGNOSIS — F32A Depression, unspecified: Secondary | ICD-10-CM | POA: Diagnosis not present

## 2021-05-22 DIAGNOSIS — G4700 Insomnia, unspecified: Secondary | ICD-10-CM | POA: Diagnosis not present

## 2021-05-22 DIAGNOSIS — J9611 Chronic respiratory failure with hypoxia: Secondary | ICD-10-CM | POA: Diagnosis not present

## 2021-05-22 DIAGNOSIS — F0150 Vascular dementia without behavioral disturbance: Secondary | ICD-10-CM | POA: Diagnosis not present

## 2021-05-22 DIAGNOSIS — F419 Anxiety disorder, unspecified: Secondary | ICD-10-CM | POA: Diagnosis not present

## 2021-05-22 DIAGNOSIS — I5032 Chronic diastolic (congestive) heart failure: Secondary | ICD-10-CM | POA: Diagnosis not present

## 2021-05-22 DIAGNOSIS — I2510 Atherosclerotic heart disease of native coronary artery without angina pectoris: Secondary | ICD-10-CM | POA: Diagnosis not present

## 2021-05-22 DIAGNOSIS — E039 Hypothyroidism, unspecified: Secondary | ICD-10-CM | POA: Diagnosis not present

## 2021-09-22 DEATH — deceased
